# Patient Record
Sex: Male | Born: 1975 | Race: Black or African American | Hispanic: No | Marital: Married | Smoking: Current every day smoker
Health system: Southern US, Community
[De-identification: ages and names within clinical notes are randomized; demographics above are authoritative.]

## PROBLEM LIST (undated history)

## (undated) DIAGNOSIS — F17200 Nicotine dependence, unspecified, uncomplicated: Secondary | ICD-10-CM

## (undated) DIAGNOSIS — M549 Dorsalgia, unspecified: Secondary | ICD-10-CM

## (undated) DIAGNOSIS — F1220 Cannabis dependence, uncomplicated: Secondary | ICD-10-CM

## (undated) DIAGNOSIS — F1020 Alcohol dependence, uncomplicated: Secondary | ICD-10-CM

---

## 1998-10-28 ENCOUNTER — Emergency Department (HOSPITAL_COMMUNITY): Admission: EM | Admit: 1998-10-28 | Discharge: 1998-10-28 | Payer: Self-pay | Admitting: Emergency Medicine

## 1999-01-04 ENCOUNTER — Emergency Department (HOSPITAL_COMMUNITY): Admission: EM | Admit: 1999-01-04 | Discharge: 1999-01-04 | Payer: Self-pay | Admitting: Emergency Medicine

## 1999-07-22 ENCOUNTER — Encounter: Admission: RE | Admit: 1999-07-22 | Discharge: 1999-07-22 | Payer: Self-pay | Admitting: Internal Medicine

## 2006-10-29 ENCOUNTER — Emergency Department (HOSPITAL_COMMUNITY): Admission: EM | Admit: 2006-10-29 | Discharge: 2006-10-29 | Payer: Self-pay | Admitting: Emergency Medicine

## 2008-01-13 ENCOUNTER — Emergency Department (HOSPITAL_COMMUNITY): Admission: EM | Admit: 2008-01-13 | Discharge: 2008-01-13 | Payer: Self-pay | Admitting: Emergency Medicine

## 2010-12-07 ENCOUNTER — Emergency Department (HOSPITAL_COMMUNITY)
Admission: EM | Admit: 2010-12-07 | Discharge: 2010-12-07 | Disposition: A | Discharge status: Home / Self Care | Payer: Self-pay | Attending: Emergency Medicine | Admitting: Emergency Medicine

## 2010-12-07 ENCOUNTER — Emergency Department (HOSPITAL_COMMUNITY): Payer: Self-pay

## 2010-12-07 DIAGNOSIS — M5030 Other cervical disc degeneration, unspecified cervical region: Secondary | ICD-10-CM | POA: Insufficient documentation

## 2010-12-07 DIAGNOSIS — M546 Pain in thoracic spine: Secondary | ICD-10-CM | POA: Insufficient documentation

## 2010-12-07 DIAGNOSIS — M47812 Spondylosis without myelopathy or radiculopathy, cervical region: Secondary | ICD-10-CM | POA: Insufficient documentation

## 2010-12-07 DIAGNOSIS — M4802 Spinal stenosis, cervical region: Secondary | ICD-10-CM | POA: Insufficient documentation

## 2010-12-07 DIAGNOSIS — R209 Unspecified disturbances of skin sensation: Secondary | ICD-10-CM | POA: Insufficient documentation

## 2010-12-07 DIAGNOSIS — M5020 Other cervical disc displacement, unspecified cervical region: Secondary | ICD-10-CM | POA: Insufficient documentation

## 2012-08-26 ENCOUNTER — Encounter (HOSPITAL_COMMUNITY): Payer: Self-pay | Admitting: Emergency Medicine

## 2012-08-26 ENCOUNTER — Emergency Department (HOSPITAL_COMMUNITY)
Admission: EM | Admit: 2012-08-26 | Discharge: 2012-08-26 | Disposition: A | Discharge status: Home / Self Care | Payer: No Typology Code available for payment source | Attending: Emergency Medicine | Admitting: Emergency Medicine

## 2012-08-26 ENCOUNTER — Emergency Department (HOSPITAL_COMMUNITY): Payer: No Typology Code available for payment source

## 2012-08-26 DIAGNOSIS — F17200 Nicotine dependence, unspecified, uncomplicated: Secondary | ICD-10-CM | POA: Insufficient documentation

## 2012-08-26 DIAGNOSIS — M25522 Pain in left elbow: Secondary | ICD-10-CM

## 2012-08-26 DIAGNOSIS — S59919A Unspecified injury of unspecified forearm, initial encounter: Secondary | ICD-10-CM | POA: Insufficient documentation

## 2012-08-26 DIAGNOSIS — Y939 Activity, unspecified: Secondary | ICD-10-CM | POA: Insufficient documentation

## 2012-08-26 DIAGNOSIS — S0993XA Unspecified injury of face, initial encounter: Secondary | ICD-10-CM | POA: Insufficient documentation

## 2012-08-26 DIAGNOSIS — S6990XA Unspecified injury of unspecified wrist, hand and finger(s), initial encounter: Secondary | ICD-10-CM | POA: Insufficient documentation

## 2012-08-26 DIAGNOSIS — S59909A Unspecified injury of unspecified elbow, initial encounter: Secondary | ICD-10-CM | POA: Insufficient documentation

## 2012-08-26 DIAGNOSIS — M542 Cervicalgia: Secondary | ICD-10-CM

## 2012-08-26 DIAGNOSIS — S199XXA Unspecified injury of neck, initial encounter: Secondary | ICD-10-CM | POA: Insufficient documentation

## 2012-08-26 MED ORDER — IBUPROFEN 600 MG PO TABS: 600.0000 mg | ORAL_TABLET | Freq: Four times a day (QID) | ORAL | Status: DC | PRN

## 2012-08-26 MED ORDER — TRAMADOL HCL 50 MG PO TABS
100.0000 mg | ORAL_TABLET | Freq: Once | ORAL | Status: AC
  Administered 2012-08-26: 100 mg via ORAL
  Filled 2012-08-26: qty 2

## 2012-08-26 MED ORDER — TRAMADOL-ACETAMINOPHEN 37.5-325 MG PO TABS: ORAL_TABLET | ORAL | Status: DC

## 2012-08-26 MED ORDER — CYCLOBENZAPRINE HCL 10 MG PO TABS
10.0000 mg | ORAL_TABLET | Freq: Once | ORAL | Status: AC
  Administered 2012-08-26: 10 mg via ORAL
  Filled 2012-08-26: qty 1

## 2012-08-26 MED ORDER — METHOCARBAMOL 500 MG PO TABS: ORAL_TABLET | ORAL | Status: DC

## 2012-08-26 MED ORDER — IBUPROFEN 400 MG PO TABS
800.0000 mg | ORAL_TABLET | Freq: Once | ORAL | Status: AC
  Administered 2012-08-26: 800 mg via ORAL
  Filled 2012-08-26: qty 2
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
## 2012-08-26 NOTE — ED Notes (Signed)
Discharge instructions reviewed. Pt verbalized understanding.  

## 2012-08-30 ENCOUNTER — Emergency Department (HOSPITAL_COMMUNITY)
Admission: EM | Admit: 2012-08-30 | Discharge: 2012-08-30 | Disposition: A | Discharge status: Home / Self Care | Payer: No Typology Code available for payment source | Attending: Emergency Medicine | Admitting: Emergency Medicine

## 2012-08-30 ENCOUNTER — Encounter (HOSPITAL_COMMUNITY): Payer: Self-pay | Admitting: Emergency Medicine

## 2012-08-30 DIAGNOSIS — M545 Low back pain, unspecified: Secondary | ICD-10-CM | POA: Insufficient documentation

## 2012-08-30 DIAGNOSIS — F17200 Nicotine dependence, unspecified, uncomplicated: Secondary | ICD-10-CM | POA: Insufficient documentation

## 2012-08-30 DIAGNOSIS — S39012A Strain of muscle, fascia and tendon of lower back, initial encounter: Secondary | ICD-10-CM

## 2012-08-30 MED ORDER — KETOROLAC TROMETHAMINE 60 MG/2ML IM SOLN
60.0000 mg | Freq: Once | INTRAMUSCULAR | Status: AC
  Administered 2012-08-30: 60 mg via INTRAMUSCULAR
  Filled 2012-08-30: qty 2

## 2012-08-30 MED ORDER — ACETAMINOPHEN 325 MG PO TABS
975.0000 mg | ORAL_TABLET | Freq: Once | ORAL | Status: AC
  Administered 2012-08-30: 975 mg via ORAL
  Filled 2012-08-30: qty 1
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
## 2012-08-30 NOTE — ED Provider Notes (Signed)
Medical screening examination/treatment/procedure(s) were conducted as a shared visit with non-physician practitioner(s) and myself.  I personally evaluated the patient during the encounter.  No focal deficits on exam.  He has an exam consistent with lower back strain.  Encouraged him to fill the prescriptions he received during his first ER visit.  Tobin Chad, MD 08/30/12 2056
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
## 2012-10-17 ENCOUNTER — Ambulatory Visit
Payer: No Typology Code available for payment source | Attending: Orthopaedic Surgery | Admitting: Rehabilitative and Restorative Service Providers"

## 2012-10-17 DIAGNOSIS — IMO0001 Reserved for inherently not codable concepts without codable children: Secondary | ICD-10-CM | POA: Insufficient documentation

## 2012-10-17 DIAGNOSIS — M545 Low back pain, unspecified: Secondary | ICD-10-CM | POA: Insufficient documentation

## 2012-10-17 DIAGNOSIS — M542 Cervicalgia: Secondary | ICD-10-CM | POA: Insufficient documentation

## 2012-10-17 DIAGNOSIS — M25519 Pain in unspecified shoulder: Secondary | ICD-10-CM | POA: Insufficient documentation

## 2012-10-17 DIAGNOSIS — M2569 Stiffness of other specified joint, not elsewhere classified: Secondary | ICD-10-CM | POA: Insufficient documentation

## 2012-10-30 ENCOUNTER — Ambulatory Visit: Payer: No Typology Code available for payment source | Admitting: Physical Therapy

## 2012-11-02 ENCOUNTER — Encounter: Payer: Self-pay | Admitting: Physical Therapy

## 2012-11-06 ENCOUNTER — Ambulatory Visit: Payer: No Typology Code available for payment source | Attending: Orthopaedic Surgery | Admitting: Physical Therapy

## 2012-11-06 DIAGNOSIS — IMO0001 Reserved for inherently not codable concepts without codable children: Secondary | ICD-10-CM | POA: Insufficient documentation

## 2012-11-06 DIAGNOSIS — M2569 Stiffness of other specified joint, not elsewhere classified: Secondary | ICD-10-CM | POA: Insufficient documentation

## 2012-11-06 DIAGNOSIS — M545 Low back pain, unspecified: Secondary | ICD-10-CM | POA: Insufficient documentation

## 2012-11-06 DIAGNOSIS — M542 Cervicalgia: Secondary | ICD-10-CM | POA: Insufficient documentation

## 2012-11-06 DIAGNOSIS — M25519 Pain in unspecified shoulder: Secondary | ICD-10-CM | POA: Insufficient documentation

## 2012-11-09 ENCOUNTER — Encounter: Payer: Self-pay | Admitting: Physical Therapy

## 2012-11-09 ENCOUNTER — Ambulatory Visit: Payer: No Typology Code available for payment source | Admitting: Physical Therapy

## 2012-11-13 ENCOUNTER — Encounter: Payer: Self-pay | Admitting: Physical Therapy

## 2012-11-14 ENCOUNTER — Ambulatory Visit: Payer: No Typology Code available for payment source | Admitting: Rehabilitative and Restorative Service Providers"

## 2012-11-15 ENCOUNTER — Encounter: Payer: Self-pay | Admitting: Rehabilitative and Restorative Service Providers"

## 2012-11-15 ENCOUNTER — Ambulatory Visit: Payer: No Typology Code available for payment source | Admitting: Rehabilitative and Restorative Service Providers"

## 2012-11-20 ENCOUNTER — Ambulatory Visit: Payer: No Typology Code available for payment source | Admitting: Rehabilitative and Restorative Service Providers"

## 2012-11-21 ENCOUNTER — Ambulatory Visit: Payer: No Typology Code available for payment source | Admitting: Physical Therapy

## 2012-11-23 ENCOUNTER — Ambulatory Visit: Payer: No Typology Code available for payment source | Admitting: Physical Therapy

## 2012-11-27 ENCOUNTER — Ambulatory Visit: Payer: No Typology Code available for payment source | Admitting: Rehabilitative and Restorative Service Providers"

## 2012-11-30 ENCOUNTER — Ambulatory Visit: Payer: No Typology Code available for payment source | Admitting: Rehabilitation

## 2012-12-04 ENCOUNTER — Ambulatory Visit
Payer: No Typology Code available for payment source | Attending: Orthopaedic Surgery | Admitting: Rehabilitative and Restorative Service Providers"

## 2012-12-04 DIAGNOSIS — IMO0001 Reserved for inherently not codable concepts without codable children: Secondary | ICD-10-CM | POA: Insufficient documentation

## 2012-12-04 DIAGNOSIS — M2569 Stiffness of other specified joint, not elsewhere classified: Secondary | ICD-10-CM | POA: Insufficient documentation

## 2012-12-04 DIAGNOSIS — M25519 Pain in unspecified shoulder: Secondary | ICD-10-CM | POA: Insufficient documentation

## 2012-12-04 DIAGNOSIS — M545 Low back pain, unspecified: Secondary | ICD-10-CM | POA: Insufficient documentation

## 2012-12-04 DIAGNOSIS — M542 Cervicalgia: Secondary | ICD-10-CM | POA: Insufficient documentation

## 2012-12-07 ENCOUNTER — Ambulatory Visit: Payer: No Typology Code available for payment source | Admitting: Rehabilitative and Restorative Service Providers"

## 2012-12-12 ENCOUNTER — Ambulatory Visit: Payer: No Typology Code available for payment source | Admitting: Rehabilitation

## 2012-12-12 ENCOUNTER — Ambulatory Visit: Payer: No Typology Code available for payment source | Admitting: Rehabilitative and Restorative Service Providers"

## 2012-12-14 ENCOUNTER — Ambulatory Visit: Payer: No Typology Code available for payment source | Admitting: Physical Therapy

## 2013-01-23 ENCOUNTER — Other Ambulatory Visit (HOSPITAL_COMMUNITY): Payer: Self-pay | Admitting: Orthopaedic Surgery

## 2013-01-23 DIAGNOSIS — M5030 Other cervical disc degeneration, unspecified cervical region: Secondary | ICD-10-CM

## 2013-01-26 ENCOUNTER — Ambulatory Visit (HOSPITAL_COMMUNITY): Payer: No Typology Code available for payment source

## 2013-08-14 ENCOUNTER — Emergency Department (INDEPENDENT_AMBULATORY_CARE_PROVIDER_SITE_OTHER)
Admission: EM | Admit: 2013-08-14 | Discharge: 2013-08-14 | Disposition: A | Discharge status: Home / Self Care | Payer: Self-pay | Source: Home / Self Care | Attending: Family Medicine | Admitting: Family Medicine

## 2013-08-14 ENCOUNTER — Encounter (HOSPITAL_COMMUNITY): Payer: Self-pay | Admitting: Emergency Medicine

## 2013-08-14 DIAGNOSIS — M5412 Radiculopathy, cervical region: Secondary | ICD-10-CM

## 2013-08-14 MED ORDER — MELOXICAM 15 MG PO TABS: 15.0000 mg | ORAL_TABLET | Freq: Every day | ORAL | Status: DC | PRN
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
## 2017-08-16 DIAGNOSIS — Z13220 Encounter for screening for lipoid disorders: Secondary | ICD-10-CM | POA: Diagnosis not present

## 2017-08-16 DIAGNOSIS — Z125 Encounter for screening for malignant neoplasm of prostate: Secondary | ICD-10-CM | POA: Diagnosis not present

## 2017-08-16 DIAGNOSIS — Z0000 Encounter for general adult medical examination without abnormal findings: Secondary | ICD-10-CM | POA: Diagnosis not present

## 2017-08-16 DIAGNOSIS — D649 Anemia, unspecified: Secondary | ICD-10-CM | POA: Diagnosis not present

## 2017-08-16 DIAGNOSIS — Z23 Encounter for immunization: Secondary | ICD-10-CM | POA: Diagnosis not present

## 2017-11-23 ENCOUNTER — Encounter (HOSPITAL_COMMUNITY): Payer: Self-pay | Admitting: Family Medicine

## 2017-11-23 ENCOUNTER — Emergency Department (HOSPITAL_COMMUNITY)
Admission: EM | Admit: 2017-11-23 | Discharge: 2017-11-23 | Disposition: A | Discharge status: Home / Self Care | Payer: BLUE CROSS/BLUE SHIELD | Attending: Emergency Medicine | Admitting: Emergency Medicine

## 2017-11-23 DIAGNOSIS — Z79899 Other long term (current) drug therapy: Secondary | ICD-10-CM | POA: Diagnosis not present

## 2017-11-23 DIAGNOSIS — M545 Low back pain, unspecified: Secondary | ICD-10-CM

## 2017-11-23 DIAGNOSIS — F17210 Nicotine dependence, cigarettes, uncomplicated: Secondary | ICD-10-CM | POA: Insufficient documentation

## 2017-11-23 MED ORDER — CYCLOBENZAPRINE HCL 10 MG PO TABS
10.0000 mg | ORAL_TABLET | Freq: Once | ORAL | Status: AC
  Administered 2017-11-23: 10 mg via ORAL
  Filled 2017-11-23: qty 1

## 2017-11-23 MED ORDER — MELOXICAM 15 MG PO TABS: 15.0000 mg | ORAL_TABLET | Freq: Every day | ORAL | 0 refills | Status: DC

## 2017-11-23 MED ORDER — LIDOCAINE 5 % EX PTCH: 1.0000 | MEDICATED_PATCH | CUTANEOUS | 0 refills | Status: DC

## 2017-11-23 MED ORDER — METHOCARBAMOL 500 MG PO TABS: 500.0000 mg | ORAL_TABLET | Freq: Two times a day (BID) | ORAL | 0 refills | Status: DC

## 2017-11-23 MED ORDER — KETOROLAC TROMETHAMINE 30 MG/ML IJ SOLN
30.0000 mg | Freq: Once | INTRAMUSCULAR | Status: AC
  Administered 2017-11-23: 30 mg via INTRAMUSCULAR
  Filled 2017-11-23: qty 1
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
## 2017-11-23 NOTE — Discharge Instructions (Signed)
You were seen here today for Back Pain: Low back pain is discomfort in the lower back that may be due to injuries to muscles and ligaments around the spine. Occasionally, it may be caused by a problem to a part of the spine called a disc. Your back pain should be treated with medicines listed below as well as back exercises and this back pain should get better over the next 2 weeks. Most patients get completely well in 4 weeks. It is important to know however, if you develop severe or worsening pain, low back pain with fever, numbness, weakness or inability to walk or urinate, you should return to the ER immediately.  Please follow up with your doctor this week for a recheck if still having symptoms.  HOME INSTRUCTIONS Self - care:  The application of heat can help soothe the pain.  Maintaining your daily activities, including walking (this is encouraged), as it will help you get better faster than just staying in bed. Do not life, push, pull anything more than 10 pounds for the next week. I am attaching back exercises that you can do at home to help facilitate your recovery.   Back Exercises - I have attached a handout on back exercises that can be done at home to help facilitate your recovery.   Medications are also useful to help with pain control.   Acetaminophen.  This medication is generally safe, and found over the counter. Take as directed for your age. You should not take more than 8 of the extra strength (500mg ) pills a day (max dose is 4000mg  total OVER one day)  Non steroidal anti inflammatory: This includes medications including Ibuprofen, naproxen and Mobic; These medications help both pain and swelling and are very useful in treating back pain.  They should be taken with food, as they can cause stomach upset, and more seriously, stomach bleeding. Do not combine the medications.   Muscle relaxants:  These medications can help with muscle tightness that is a cause of lower back pain.  Most  of these medications can cause drowsiness, and it is not safe to drive or use dangerous machinery while taking them. They are primarily helpful when taken at night before sleep.  Lidocaine Patches: Apply as directed.   You will need to follow up with your primary healthcare provider  or the Orthopedist in 1-2 weeks for reassessment and persistent symptoms.  Be aware that if you develop new symptoms, such as a fever, leg weakness, difficulty with or loss of control of your urine or bowels, abdominal pain, or more severe pain, you will need to seek medical attention and/or return to the Emergency department. Additional Information:  Your vital signs today were: BP 140/87 (BP Location: Left Arm)    Pulse 85    Temp 98.4 F (36.9 C) (Oral)    Resp 16    Ht 6\' 1"  (1.854 m)    Wt 82.6 kg (182 lb)    SpO2 98%    BMI 24.01 kg/m  If your blood pressure (BP) was elevated above 135/85 this visit, please have this repeated by your doctor within one month. ---------------

## 2018-12-05 DIAGNOSIS — Z0000 Encounter for general adult medical examination without abnormal findings: Secondary | ICD-10-CM | POA: Diagnosis not present

## 2018-12-05 DIAGNOSIS — Z125 Encounter for screening for malignant neoplasm of prostate: Secondary | ICD-10-CM | POA: Diagnosis not present

## 2018-12-05 DIAGNOSIS — Z113 Encounter for screening for infections with a predominantly sexual mode of transmission: Secondary | ICD-10-CM | POA: Diagnosis not present

## 2018-12-05 DIAGNOSIS — D649 Anemia, unspecified: Secondary | ICD-10-CM | POA: Diagnosis not present

## 2018-12-05 DIAGNOSIS — Z13220 Encounter for screening for lipoid disorders: Secondary | ICD-10-CM | POA: Diagnosis not present

## 2018-12-20 DIAGNOSIS — R1013 Epigastric pain: Secondary | ICD-10-CM | POA: Diagnosis not present

## 2018-12-20 DIAGNOSIS — E611 Iron deficiency: Secondary | ICD-10-CM | POA: Diagnosis not present

## 2019-07-15 ENCOUNTER — Other Ambulatory Visit: Payer: Self-pay

## 2019-07-15 ENCOUNTER — Encounter (HOSPITAL_COMMUNITY): Payer: Self-pay | Admitting: Emergency Medicine

## 2019-07-15 ENCOUNTER — Ambulatory Visit (HOSPITAL_COMMUNITY)
Admission: EM | Admit: 2019-07-15 | Discharge: 2019-07-15 | Disposition: A | Discharge status: Home / Self Care | Payer: BC Managed Care – PPO | Attending: Family Medicine | Admitting: Family Medicine

## 2019-07-15 DIAGNOSIS — S39012A Strain of muscle, fascia and tendon of lower back, initial encounter: Secondary | ICD-10-CM

## 2019-07-15 MED ORDER — MELOXICAM 15 MG PO TABS: 15.0000 mg | ORAL_TABLET | Freq: Every day | ORAL | 0 refills | Status: DC

## 2019-07-15 MED ORDER — CYCLOBENZAPRINE HCL 10 MG PO TABS: 10.0000 mg | ORAL_TABLET | Freq: Two times a day (BID) | ORAL | 0 refills | Status: DC | PRN

## 2019-07-15 NOTE — ED Provider Notes (Signed)
[place]    CSN: [id number] Arrival date & time: 07/15/19  1528      History   Chief Complaint Chief Complaint  Patient presents with  . Back Pain    HPI Derek [name] is a 43 y.o. male.  Patient was lifting a sofa several days ago at work and felt pain in his left low back.  He denies any radiation.  He has had some back strain issues before.  He has tried heating pad without much success. HPI  History reviewed. No pertinent past medical history.  There are no active problems to display for this patient.   History reviewed. No pertinent surgical history.     Home Medications    Prior to Admission medications   Medication Sig Start Date End Date Taking? Authorizing Provider  ferrous sulfate 325 (65 FE) MG tablet Take 325 mg by mouth 2 (two) times daily. 08/22/17   [provider]  ibuprofen (ADVIL,MOTRIN) 200 MG tablet Take 400 mg by mouth every 6 (six) hours as needed. For pain    [provider]  lidocaine (LIDODERM) 5 % Place 1 patch onto the skin daily. Remove & Discard patch within 12 hours or as directed by MD 11/23/17   Maczis, Barth Kirks, PA-C  meloxicam (MOBIC) 15 MG tablet Take 1 tablet (15 mg total) by mouth daily. 11/23/17   Maczis, Barth Kirks, PA-C  methocarbamol (ROBAXIN) 500 MG tablet Take 1 tablet (500 mg total) by mouth 2 (two) times daily. 11/23/17   Maczis, Barth Kirks, PA-C    Family History History reviewed. No pertinent family history.  Social History Social History   Tobacco Use  . Smoking status: Current Every Day Smoker    Packs/day: 0.20    Types: Cigarettes  . Smokeless tobacco: Never Used  Substance Use Topics  . Alcohol use: Yes    Comment: 2-3 times a week.   . Drug use: No     Allergies   Patient has no known allergies.   Review of Systems Review of Systems  Musculoskeletal: Positive for back pain.  All other systems reviewed and are negative.    Physical Exam Triage Vital Signs ED  Triage Vitals  Enc Vitals Group     BP 07/15/19 1545 (!) 149/94     Pulse Rate 07/15/19 1545 97     Resp 07/15/19 1545 18     Temp 07/15/19 1545 99.2 F (37.3 C)     Temp Source 07/15/19 1545 Temporal     SpO2 07/15/19 1545 100 %     Weight --      Height --      Head Circumference --      Peak Flow --      Pain Score 07/15/19 1541 9     Pain Loc --      Pain Edu? --      Excl. in [place]? --    No data found.  Updated Vital Signs BP (!) 149/94 (BP Location: Right Arm)   Pulse 97   Temp 99.2 F (37.3 C) (Temporal)   Resp 18   SpO2 100%   Visual Acuity Right Eye Distance:   Left Eye Distance:   Bilateral Distance:    Right Eye Near:   Left Eye Near:    Bilateral Near:     Physical Exam Vitals signs and nursing note reviewed.  Constitutional:      Appearance: Normal appearance.  Cardiovascular:     Rate  and Rhythm: Normal rate and regular rhythm.  Pulmonary:     Effort: Pulmonary effort is normal.  Musculoskeletal:     Comments: Back: Decreased flexion lateral bending is almost normal. Straight leg raising is negative Deep tendon reflexes are symmetric  Neurological:     Mental Status: He is alert.      UC Treatments / Results  Labs (all labs ordered are listed, but only abnormal results are displayed) Labs Reviewed - No data to display  EKG   Radiology No results found.  Procedures Procedures (including critical care time)  Medications Ordered in UC Medications - No data to display  Initial Impression / Assessment and Plan / UC Course  I have reviewed the triage vital signs and the nursing notes.  Pertinent labs & imaging results that were available during my care of the patient were reviewed by me and considered in my medical decision making (see chart for details).     Mechanical low back strain Final Clinical Impressions(s) / UC Diagnoses   Final diagnoses:  None   Discharge Instructions   None    ED Prescriptions    None      Controlled Substance Prescriptions [place] Controlled Substance Registry consulted? No   Frederica KusterMiller, Stephen M, MD 07/15/19 [phone]

## 2019-07-15 NOTE — ED Triage Notes (Signed)
Picked up a heavy piece of furniture while at work Thursday.  Pain in lower back, just above tailbone, lower left.  Denies pain in legs.    History of back issues

## 2020-01-07 ENCOUNTER — Inpatient Hospital Stay (HOSPITAL_COMMUNITY): Payer: BC Managed Care – PPO

## 2020-01-07 ENCOUNTER — Emergency Department (HOSPITAL_COMMUNITY): Payer: BC Managed Care – PPO

## 2020-01-07 ENCOUNTER — Encounter (HOSPITAL_COMMUNITY): Payer: Self-pay | Admitting: Internal Medicine

## 2020-01-07 ENCOUNTER — Inpatient Hospital Stay (HOSPITAL_COMMUNITY)
Admission: EM | Admit: 2020-01-07 | Discharge: 2020-01-15 | DRG: 064 | Disposition: A | Discharge status: Rehabilitation Facility | Payer: BC Managed Care – PPO | Attending: Internal Medicine | Admitting: Internal Medicine

## 2020-01-07 ENCOUNTER — Other Ambulatory Visit: Payer: Self-pay

## 2020-01-07 DIAGNOSIS — F1010 Alcohol abuse, uncomplicated: Secondary | ICD-10-CM | POA: Diagnosis not present

## 2020-01-07 DIAGNOSIS — K219 Gastro-esophageal reflux disease without esophagitis: Secondary | ICD-10-CM | POA: Diagnosis not present

## 2020-01-07 DIAGNOSIS — E663 Overweight: Secondary | ICD-10-CM | POA: Diagnosis present

## 2020-01-07 DIAGNOSIS — R29810 Facial weakness: Secondary | ICD-10-CM | POA: Diagnosis present

## 2020-01-07 DIAGNOSIS — E872 Acidosis: Secondary | ICD-10-CM | POA: Diagnosis not present

## 2020-01-07 DIAGNOSIS — Z20822 Contact with and (suspected) exposure to covid-19: Secondary | ICD-10-CM | POA: Diagnosis not present

## 2020-01-07 DIAGNOSIS — D72829 Elevated white blood cell count, unspecified: Secondary | ICD-10-CM

## 2020-01-07 DIAGNOSIS — Z716 Tobacco abuse counseling: Secondary | ICD-10-CM | POA: Diagnosis not present

## 2020-01-07 DIAGNOSIS — I63311 Cerebral infarction due to thrombosis of right middle cerebral artery: Secondary | ICD-10-CM | POA: Diagnosis not present

## 2020-01-07 DIAGNOSIS — I69391 Dysphagia following cerebral infarction: Secondary | ICD-10-CM | POA: Diagnosis not present

## 2020-01-07 DIAGNOSIS — R6510 Systemic inflammatory response syndrome (SIRS) of non-infectious origin without acute organ dysfunction: Secondary | ICD-10-CM | POA: Diagnosis present

## 2020-01-07 DIAGNOSIS — F1020 Alcohol dependence, uncomplicated: Secondary | ICD-10-CM | POA: Diagnosis not present

## 2020-01-07 DIAGNOSIS — M549 Dorsalgia, unspecified: Secondary | ICD-10-CM | POA: Diagnosis not present

## 2020-01-07 DIAGNOSIS — Z6828 Body mass index (BMI) 28.0-28.9, adult: Secondary | ICD-10-CM

## 2020-01-07 DIAGNOSIS — F1220 Cannabis dependence, uncomplicated: Secondary | ICD-10-CM | POA: Diagnosis not present

## 2020-01-07 DIAGNOSIS — R531 Weakness: Secondary | ICD-10-CM | POA: Diagnosis not present

## 2020-01-07 DIAGNOSIS — I639 Cerebral infarction, unspecified: Secondary | ICD-10-CM | POA: Diagnosis not present

## 2020-01-07 DIAGNOSIS — F17210 Nicotine dependence, cigarettes, uncomplicated: Secondary | ICD-10-CM | POA: Diagnosis not present

## 2020-01-07 DIAGNOSIS — I619 Nontraumatic intracerebral hemorrhage, unspecified: Secondary | ICD-10-CM | POA: Diagnosis present

## 2020-01-07 DIAGNOSIS — I63511 Cerebral infarction due to unspecified occlusion or stenosis of right middle cerebral artery: Secondary | ICD-10-CM | POA: Diagnosis not present

## 2020-01-07 DIAGNOSIS — Z79899 Other long term (current) drug therapy: Secondary | ICD-10-CM

## 2020-01-07 DIAGNOSIS — E876 Hypokalemia: Secondary | ICD-10-CM | POA: Diagnosis not present

## 2020-01-07 DIAGNOSIS — Z791 Long term (current) use of non-steroidal anti-inflammatories (NSAID): Secondary | ICD-10-CM | POA: Diagnosis not present

## 2020-01-07 DIAGNOSIS — G894 Chronic pain syndrome: Secondary | ICD-10-CM | POA: Diagnosis not present

## 2020-01-07 DIAGNOSIS — G8110 Spastic hemiplegia affecting unspecified side: Secondary | ICD-10-CM | POA: Diagnosis not present

## 2020-01-07 DIAGNOSIS — R252 Cramp and spasm: Secondary | ICD-10-CM | POA: Diagnosis not present

## 2020-01-07 DIAGNOSIS — R1312 Dysphagia, oropharyngeal phase: Secondary | ICD-10-CM | POA: Diagnosis not present

## 2020-01-07 DIAGNOSIS — K5900 Constipation, unspecified: Secondary | ICD-10-CM | POA: Diagnosis not present

## 2020-01-07 DIAGNOSIS — R296 Repeated falls: Secondary | ICD-10-CM | POA: Diagnosis present

## 2020-01-07 DIAGNOSIS — I10 Essential (primary) hypertension: Secondary | ICD-10-CM | POA: Diagnosis not present

## 2020-01-07 DIAGNOSIS — G8194 Hemiplegia, unspecified affecting left nondominant side: Secondary | ICD-10-CM | POA: Diagnosis not present

## 2020-01-07 DIAGNOSIS — G8929 Other chronic pain: Secondary | ICD-10-CM | POA: Diagnosis not present

## 2020-01-07 DIAGNOSIS — Z8679 Personal history of other diseases of the circulatory system: Secondary | ICD-10-CM | POA: Diagnosis not present

## 2020-01-07 DIAGNOSIS — R1310 Dysphagia, unspecified: Secondary | ICD-10-CM | POA: Diagnosis present

## 2020-01-07 DIAGNOSIS — I69192 Facial weakness following nontraumatic intracerebral hemorrhage: Secondary | ICD-10-CM | POA: Diagnosis not present

## 2020-01-07 DIAGNOSIS — H5500 Unspecified nystagmus: Secondary | ICD-10-CM | POA: Diagnosis not present

## 2020-01-07 DIAGNOSIS — E785 Hyperlipidemia, unspecified: Secondary | ICD-10-CM | POA: Diagnosis not present

## 2020-01-07 DIAGNOSIS — F17200 Nicotine dependence, unspecified, uncomplicated: Secondary | ICD-10-CM | POA: Diagnosis present

## 2020-01-07 DIAGNOSIS — I69191 Dysphagia following nontraumatic intracerebral hemorrhage: Secondary | ICD-10-CM | POA: Diagnosis not present

## 2020-01-07 DIAGNOSIS — I63411 Cerebral infarction due to embolism of right middle cerebral artery: Secondary | ICD-10-CM | POA: Diagnosis not present

## 2020-01-07 DIAGNOSIS — I69154 Hemiplegia and hemiparesis following nontraumatic intracerebral hemorrhage affecting left non-dominant side: Secondary | ICD-10-CM | POA: Diagnosis not present

## 2020-01-07 DIAGNOSIS — R52 Pain, unspecified: Secondary | ICD-10-CM | POA: Diagnosis not present

## 2020-01-07 DIAGNOSIS — G936 Cerebral edema: Secondary | ICD-10-CM | POA: Diagnosis not present

## 2020-01-07 DIAGNOSIS — W19XXXA Unspecified fall, initial encounter: Secondary | ICD-10-CM | POA: Diagnosis not present

## 2020-01-07 DIAGNOSIS — Z7982 Long term (current) use of aspirin: Secondary | ICD-10-CM | POA: Diagnosis not present

## 2020-01-07 HISTORY — DX: Dorsalgia, unspecified: M54.9

## 2020-01-07 HISTORY — DX: Nicotine dependence, unspecified, uncomplicated: F17.200

## 2020-01-07 HISTORY — DX: Alcohol dependence, uncomplicated: F10.20

## 2020-01-07 HISTORY — DX: Cannabis dependence, uncomplicated: F12.20

## 2020-01-07 HISTORY — DX: Cerebral infarction due to unspecified occlusion or stenosis of right middle cerebral artery: I63.511

## 2020-01-07 LAB — COMPREHENSIVE METABOLIC PANEL
ALT: 31 U/L (ref 0–44)
AST: 43 U/L — ABNORMAL HIGH (ref 15–41)
Albumin: 4.2 g/dL (ref 3.5–5.0)
Alkaline Phosphatase: 85 U/L (ref 38–126)
Anion gap: 15 (ref 5–15)
BUN: 8 mg/dL (ref 6–20)
CO2: 23 mmol/L (ref 22–32)
Calcium: 9.6 mg/dL (ref 8.9–10.3)
Chloride: 102 mmol/L (ref 98–111)
Creatinine, Ser: 1.19 mg/dL (ref 0.61–1.24)
GFR calc Af Amer: >60 mL/min (ref >60)
GFR calc non Af Amer: >60 mL/min (ref >60)
Glucose, Bld: 134 mg/dL — ABNORMAL HIGH (ref 70–99)
Potassium: 4 mmol/L (ref 3.5–5.1)
Sodium: 140 mmol/L (ref 135–145)
Total Bilirubin: 0.8 mg/dL (ref 0.3–1.2)
Total Protein: 7.5 g/dL (ref 6.5–8.1)

## 2020-01-07 LAB — POC SARS CORONAVIRUS 2 AG -  ED: SARS Coronavirus 2 Ag: NEGATIVE

## 2020-01-07 LAB — CBC WITH DIFFERENTIAL/PLATELET
Abs Immature Granulocytes: 0.09 10*3/uL — ABNORMAL HIGH (ref 0.00–0.07)
Basophils Absolute: 0 10*3/uL (ref 0.0–0.1)
Basophils Relative: 0 %
Eosinophils Absolute: 0 10*3/uL (ref 0.0–0.5)
Eosinophils Relative: 0 %
HCT: 44.7 % (ref 39.0–52.0)
Hemoglobin: 14.8 g/dL (ref 13.0–17.0)
Immature Granulocytes: 1 %
Lymphocytes Relative: 5 %
Lymphs Abs: 0.7 10*3/uL (ref 0.7–4.0)
MCH: 29.4 pg (ref 26.0–34.0)
MCHC: 33.1 g/dL (ref 30.0–36.0)
MCV: 88.9 fL (ref 80.0–100.0)
Monocytes Absolute: 1 10*3/uL (ref 0.1–1.0)
Monocytes Relative: 7 %
Neutro Abs: 12.6 10*3/uL — ABNORMAL HIGH (ref 1.7–7.7)
Neutrophils Relative %: 87 %
Platelets: 314 10*3/uL (ref 150–400)
RBC: 5.03 MIL/uL (ref 4.22–5.81)
RDW: 18.3 % — ABNORMAL HIGH (ref 11.5–15.5)
WBC: 14.3 10*3/uL — ABNORMAL HIGH (ref 4.0–10.5)
nRBC: 0 % (ref 0.0–0.2)

## 2020-01-07 LAB — RAPID URINE DRUG SCREEN, HOSP PERFORMED
Amphetamines: NOT DETECTED
Barbiturates: NOT DETECTED
Benzodiazepines: NOT DETECTED
Cocaine: NOT DETECTED
Opiates: NOT DETECTED
Tetrahydrocannabinol: POSITIVE — AB

## 2020-01-07 LAB — APTT: aPTT: 27 seconds (ref 24–36)

## 2020-01-07 LAB — URINALYSIS, ROUTINE W REFLEX MICROSCOPIC
Bacteria, UA: NONE SEEN
Bilirubin Urine: NEGATIVE
Glucose, UA: NEGATIVE mg/dL
Ketones, ur: 5 mg/dL — AB
Leukocytes,Ua: NEGATIVE
Nitrite: NEGATIVE
Protein, ur: 100 mg/dL — AB
Specific Gravity, Urine: 1.028 (ref 1.005–1.030)
pH: 6 (ref 5.0–8.0)

## 2020-01-07 LAB — MAGNESIUM: Magnesium: 2.1 mg/dL (ref 1.7–2.4)

## 2020-01-07 LAB — TSH: TSH: 0.501 u[IU]/mL (ref 0.350–4.500)

## 2020-01-07 LAB — PROTIME-INR
INR: 1 (ref 0.8–1.2)
Prothrombin Time: 13.3 seconds (ref 11.4–15.2)

## 2020-01-07 LAB — HIV ANTIBODY (ROUTINE TESTING W REFLEX): HIV Screen 4th Generation wRfx: NONREACTIVE

## 2020-01-07 LAB — LACTIC ACID, PLASMA: Lactic Acid, Venous: 1.9 mmol/L (ref 0.5–1.9)

## 2020-01-07 LAB — PHOSPHORUS: Phosphorus: 3.3 mg/dL (ref 2.5–4.6)

## 2020-01-07 LAB — CK: Total CK: 787 U/L — ABNORMAL HIGH (ref 49–397)

## 2020-01-07 LAB — SARS CORONAVIRUS 2 (TAT 6-24 HRS): SARS Coronavirus 2: NEGATIVE

## 2020-01-07 MED ORDER — METOPROLOL TARTRATE 5 MG/5ML IV SOLN
5.0000 mg | Freq: Two times a day (BID) | INTRAVENOUS | Status: DC
  Administered 2020-01-07 – 2020-01-11 (×7): 5 mg via INTRAVENOUS
  Filled 2020-01-07 (×7): qty 5

## 2020-01-07 MED ORDER — STROKE: EARLY STAGES OF RECOVERY BOOK
Freq: Once | Status: AC
  Filled 2020-01-07 (×2): qty 1

## 2020-01-07 MED ORDER — VANCOMYCIN HCL 1500 MG/300ML IV SOLN
1500.0000 mg | Freq: Two times a day (BID) | INTRAVENOUS | Status: DC
  Filled 2020-01-07: qty 300

## 2020-01-07 MED ORDER — METRONIDAZOLE IN NACL 5-0.79 MG/ML-% IV SOLN
500.0000 mg | Freq: Once | INTRAVENOUS | Status: AC
  Administered 2020-01-07: 500 mg via INTRAVENOUS
  Filled 2020-01-07: qty 100

## 2020-01-07 MED ORDER — SENNOSIDES-DOCUSATE SODIUM 8.6-50 MG PO TABS
1.0000 | ORAL_TABLET | Freq: Every evening | ORAL | Status: DC | PRN
  Administered 2020-01-15: 1 via ORAL
  Filled 2020-01-07: qty 1

## 2020-01-07 MED ORDER — SODIUM CHLORIDE 0.9 % IV BOLUS
1000.0000 mL | Freq: Once | INTRAVENOUS | Status: AC
  Administered 2020-01-07: 12:00:00 1000 mL via INTRAVENOUS

## 2020-01-07 MED ORDER — ATORVASTATIN CALCIUM 40 MG PO TABS
40.0000 mg | ORAL_TABLET | Freq: Every day | ORAL | Status: DC
  Administered 2020-01-08 – 2020-01-15 (×8): 40 mg via ORAL
  Filled 2020-01-07 (×7): qty 1

## 2020-01-07 MED ORDER — THIAMINE HCL 100 MG PO TABS
100.0000 mg | ORAL_TABLET | Freq: Every day | ORAL | Status: DC
  Administered 2020-01-08 – 2020-01-15 (×7): 100 mg via ORAL
  Filled 2020-01-07 (×7): qty 1

## 2020-01-07 MED ORDER — ACETAMINOPHEN 160 MG/5ML PO SOLN: 650.0000 mg | ORAL | Status: DC | PRN

## 2020-01-07 MED ORDER — ASPIRIN 325 MG PO TABS
325.0000 mg | ORAL_TABLET | Freq: Every day | ORAL | Status: DC
  Administered 2020-01-08 – 2020-01-15 (×8): 325 mg via ORAL
  Filled 2020-01-07 (×8): qty 1

## 2020-01-07 MED ORDER — NICOTINE 7 MG/24HR TD PT24
7.0000 mg | MEDICATED_PATCH | Freq: Every day | TRANSDERMAL | Status: DC
  Administered 2020-01-08 – 2020-01-11 (×3): 7 mg via TRANSDERMAL
  Filled 2020-01-07 (×7): qty 1

## 2020-01-07 MED ORDER — VANCOMYCIN HCL IN DEXTROSE 1-5 GM/200ML-% IV SOLN: 1000.0000 mg | Freq: Once | INTRAVENOUS | Status: DC

## 2020-01-07 MED ORDER — ACETAMINOPHEN 325 MG PO TABS: 650.0000 mg | ORAL_TABLET | Freq: Once | ORAL | Status: DC

## 2020-01-07 MED ORDER — SODIUM CHLORIDE 0.9 % IV SOLN: INTRAVENOUS | Status: DC

## 2020-01-07 MED ORDER — FOLIC ACID 1 MG PO TABS
1.0000 mg | ORAL_TABLET | Freq: Every day | ORAL | Status: DC
  Administered 2020-01-08 – 2020-01-15 (×8): 1 mg via ORAL
  Filled 2020-01-07 (×8): qty 1

## 2020-01-07 MED ORDER — SODIUM CHLORIDE 0.9 % IV SOLN
2.0000 g | Freq: Once | INTRAVENOUS | Status: AC
  Administered 2020-01-07: 09:00:00 2 g via INTRAVENOUS
  Filled 2020-01-07: qty 2

## 2020-01-07 MED ORDER — ENOXAPARIN SODIUM 40 MG/0.4ML ~~LOC~~ SOLN
40.0000 mg | SUBCUTANEOUS | Status: DC
  Filled 2020-01-07: qty 0.4

## 2020-01-07 MED ORDER — ENSURE ENLIVE PO LIQD
237.0000 mL | Freq: Two times a day (BID) | ORAL | Status: DC
  Administered 2020-01-09 – 2020-01-11 (×5): 237 mL via ORAL
  Filled 2020-01-07: qty 237

## 2020-01-07 MED ORDER — ASPIRIN 300 MG RE SUPP
300.0000 mg | Freq: Every day | RECTAL | Status: DC
  Filled 2020-01-07: qty 1

## 2020-01-07 MED ORDER — ADULT MULTIVITAMIN W/MINERALS CH
1.0000 | ORAL_TABLET | Freq: Every day | ORAL | Status: DC
  Administered 2020-01-08 – 2020-01-15 (×8): 1 via ORAL
  Filled 2020-01-07 (×8): qty 1

## 2020-01-07 MED ORDER — LORAZEPAM 2 MG/ML IJ SOLN: 1.0000 mg | INTRAMUSCULAR | Status: AC | PRN

## 2020-01-07 MED ORDER — ACETAMINOPHEN 650 MG RE SUPP: 650.0000 mg | RECTAL | Status: DC | PRN

## 2020-01-07 MED ORDER — THIAMINE HCL 100 MG/ML IJ SOLN
100.0000 mg | Freq: Every day | INTRAMUSCULAR | Status: DC
  Administered 2020-01-13: 100 mg via INTRAVENOUS
  Filled 2020-01-07 (×2): qty 2

## 2020-01-07 MED ORDER — ACETAMINOPHEN 325 MG PO TABS
650.0000 mg | ORAL_TABLET | ORAL | Status: DC | PRN
  Administered 2020-01-08 – 2020-01-15 (×5): 650 mg via ORAL
  Filled 2020-01-07 (×5): qty 2

## 2020-01-07 MED ORDER — LORAZEPAM 1 MG PO TABS: 1.0000 mg | ORAL_TABLET | ORAL | Status: AC | PRN

## 2020-01-07 MED ORDER — VANCOMYCIN HCL 2000 MG/400ML IV SOLN
2000.0000 mg | Freq: Once | INTRAVENOUS | Status: AC
  Administered 2020-01-07: 2000 mg via INTRAVENOUS
  Filled 2020-01-07: qty 400

## 2020-01-07 MED ORDER — LABETALOL HCL 5 MG/ML IV SOLN
20.0000 mg | Freq: Once | INTRAVENOUS | Status: DC
  Filled 2020-01-07: qty 4

## 2020-01-07 MED ORDER — SODIUM CHLORIDE 0.9 % IV SOLN: 2.0000 g | Freq: Three times a day (TID) | INTRAVENOUS | Status: DC

## 2020-01-07 NOTE — ED Notes (Signed)
ECHO at bedside.

## 2020-01-07 NOTE — H&P (Signed)
History and Physical    Derek [name] NFA:[id number] DOB: 29-Jan-1976 DOA: 01/07/2020  PCP: Patient, No Pcp Per Consultants:  None Patient coming from:  Home; NOK: Liliana Cline, [phone]  Chief Complaint: Code Stroke  HPI: Derek [name] is a 44 y.o. male with no known medical history presenting as Code Stroke.  The patient is mostly sleeping and awakes and appears appropriate but his conversation doesn't exactly make sense.  He complains of being cold - when I asked him to straighten his right leg out (it was behind his left leg), he says he was "just putting it behind this thing to keep it warm" and seems unaware completely of his left side.  He reports that he needs to call his work so he doesn't get fired, but then didn't actually want to use the phone. He reports headache.  I called and spoke with his sister.  She was notified this AM that he was taken here.  She reports that he went out Saturday night to a family member's birthday party; they took him home and he was very intoxicated.  He was placed on the bed after 0200.  His girlfriend noted him on the floor sometime later.  She found him there again sometime later and had someone help him into the bed.  He was not moving on one side.  He apparently fell back on the floor at some point.  This AM, she finally called 911.  He has chronic back pain from a remote accident.  He has been under a lot of stress - including with his live-in girlfriend.  He also has a 7yo son.   ED Course:  EMS called for a fall, noted left-sided weakness, facial droop, slurred speech, rightward gaze.  CT consistent with R MCA CVA.  Neuro has seen, MRI recommended.  Temp 100.5, given empiric abx, no symptoms or apparent source.  Review of Systems: As per HPI; otherwise review of systems reviewed and negative.  Limited accuracy  Ambulatory Status:  Ambulates without assistance  Past Medical History:  Diagnosis Date  . 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 Alcohol dependence (HCC)   . Back pain   . Marijuana dependence (HCC)   . Tobacco dependence     History reviewed. No pertinent surgical history.  Social History   Socioeconomic History  . Marital status: Single    Spouse name: Not on file  . Number of children: Not on file  . Years of education: Not on file  . Highest education level: Not on file  Occupational History  . Occupation: truck Hospital doctor  Tobacco Use  . Smoking status: Current Every Day Smoker    Packs/day: 0.20    Types: Cigarettes  . Smokeless tobacco: Never Used  Substance and Sexual Activity  . Alcohol use: Yes    Comment: 2-3 times a week.   . Drug use: Yes    Types: Marijuana  . Sexual activity: Yes    Birth control/protection: Condom  Other Topics Concern  . Not on file  Social History Narrative  . Not on file   Social Determinants of Health   Financial Resource Strain:   . Difficulty of Paying Living Expenses: Not on file  Food Insecurity:   . Worried About Programme researcher, broadcasting/film/video in the Last Year: Not on file  . Ran Out of Food in the Last Year: Not on file  Transportation Needs:   . Lack of Transportation (Medical): Not  on file  . Lack of Transportation (Non-Medical): Not on file  Physical Activity:   . Days of Exercise per Week: Not on file  . Minutes of Exercise per Session: Not on file  Stress:   . Feeling of Stress : Not on file  Social Connections:   . Frequency of Communication with Friends and Family: Not on file  . Frequency of Social Gatherings with Friends and Family: Not on file  . Attends Religious Services: Not on file  . Active Member of Clubs or Organizations: Not on file  . Attends Banker Meetings: Not on file  . Marital Status: Not on file  Intimate Partner Violence:   . Fear of Current or Ex-Partner: Not on file  . Emotionally Abused: Not on file  . Physically Abused: Not on file  . Sexually Abused: Not on file    No Known  Allergies  Family History  Problem Relation Age of Onset  . Stroke Neg Hx   . CAD Neg Hx     Prior to Admission medications   Medication Sig Start Date End Date Taking? Authorizing Provider  ferrous sulfate 325 (65 FE) MG tablet Take 325 mg by mouth 2 (two) times daily. 08/22/17  Yes [provider]  ibuprofen (ADVIL,MOTRIN) 200 MG tablet Take 400 mg by mouth every 6 (six) hours as needed. For pain   Yes [provider]  cyclobenzaprine (FLEXERIL) 10 MG tablet Take 1 tablet (10 mg total) by mouth 2 (two) times daily as needed for muscle spasms. Patient not taking: Reported on 01/07/2020 07/15/19   Frederica Kuster, MD  lidocaine (LIDODERM) 5 % Place 1 patch onto the skin daily. Remove & Discard patch within 12 hours or as directed by MD Patient not taking: Reported on 01/07/2020 11/23/17   Maczis, Elmer Sow, PA-C  meloxicam (MOBIC) 15 MG tablet Take 1 tablet (15 mg total) by mouth daily. Patient not taking: Reported on 01/07/2020 07/15/19   Frederica Kuster, MD  meloxicam (MOBIC) 15 MG tablet Take 1 tablet (15 mg total) by mouth daily. Patient not taking: Reported on 01/07/2020 07/15/19   Frederica Kuster, MD  methocarbamol (ROBAXIN) 500 MG tablet Take 1 tablet (500 mg total) by mouth 2 (two) times daily. Patient not taking: Reported on 01/07/2020 11/23/17   Princella Pellegrini    Physical Exam: Vitals:   01/07/20 1015 01/07/20 1030 01/07/20 1045 01/07/20 1135  BP: (!) 178/137 (!) 156/101 (!) 164/120 (!) 167/102  Pulse: (!) 124 79 78 79  Resp: (!) 29 (!) 24 19 (!) 26  Temp:      SpO2: 99% 99% 98% 99%  Weight:      Height:         . General:  Appears calm and comfortable and is NAD; he is sleeping comfortably and easily aroused but is not really seeming to fully engage in conversation . Eyes:  PERRL, EOMI, normal lids, iris, does not make good eye contact and maintains a mild right superolateral fixed gaze . ENT:  grossly normal hearing, lips & tongue, mmm; left sided  facial droop . Neck:  no LAD, masses or thyromegaly; no carotid bruits . Cardiovascular:  RRR, no m/r/g. No LE edema.  Marland Kitchen Respiratory:   CTA bilaterally with no wheezes/rales/rhonchi.  Normal respiratory effort. . Abdomen:  soft, NT, ND, NABS . Skin:  no rash or induration seen on limited exam . Musculoskeletal:  Marked left-sided hemiplegia with no apparent awareness of his  left side at all (his right leg was propped behind "that thing" which happened to be his left leg) . Psychiatric:  blunted mood and affect, speech fluent but somewhat inappropriate . Neurologic:  Left facial droop, marked left-sided neglect with left hemiplegia    Radiological Exams on Admission: CT Head Wo Contrast  Result Date: 01/07/2020 CLINICAL DATA:  Pt came in [facility] from home with c/o of stroke symptoms. Pt was LSN at 0200 on Saturday. Pt has L-Sided Deficits, L-Sided Facial Droop, R-Sided Gaze. EMS states patient has alcohol on Friday and had a fall on Saturday and one again this morning. Pt is complaining of Head Pain. Pt had an episode of Incontinence. Pt present with untreated HTN. EXAM: CT HEAD WITHOUT CONTRAST TECHNIQUE: Contiguous axial images were obtained from the base of the skull through the vertex without intravenous contrast. COMPARISON:  10/29/2006 FINDINGS: Brain: There is patchy hypoattenuation involving the right basal ganglia including the external and internal capsules with hypoattenuation noted in a more patchy distribution extending superiorly and laterally into the posterior right frontal and adjacent right parietal lobes. Within the area of hypoattenuation, there is relative increased attenuation in portions of the caudate nucleus head and globus pallidus and putamen, which may reflect areas of petechial hemorrhage. There is associated mass effect partial effacement of the anterior horn and body of the right lateral ventricle, and slight midline shift to the left of 2-3 mm. No other areas of abnormal  attenuation to suggest additional areas of infarct. No discrete masses. No evidence of hemorrhage other than the possible petechial hemorrhage in the right basal ganglia. No hydrocephalus. Vascular: Focus of increased attenuation noted right middle cerebral artery is suspicious for thrombus/embolus. Skull: Normal. Negative for fracture or focal lesion. Sinuses/Orbits: Globes and orbits are unremarkable. Small amount of dependent fluid in the left sphenoid sinus. Other: None. IMPRESSION: 1. Recent M1 segment, right middle cerebral artery infarct, with mass effect and possible petechial hemorrhage in portions of the right basal ganglia. Infarct extends to the posterosuperior right frontal and adjacent right parietal lobes. Suspect thrombus/embolus in the M1 segment of right middle cerebral artery. 2. No other acute or recent intracranial abnormality. Electronically Signed   By: Amie Portland M.D.   On: 01/07/2020 07:40   DG Chest Port 1 View  Result Date: 01/07/2020 CLINICAL DATA:  Rule out infection.  Stroke symptoms EXAM: PORTABLE CHEST 1 VIEW COMPARISON:  12/07/2010 FINDINGS: Normal heart size and mediastinal contours. There is no edema, consolidation, effusion, or pneumothorax. Artifact from EKG leads. IMPRESSION: No evidence of active disease. Electronically Signed   By: Marnee Spring M.D.   On: 01/07/2020 07:44    EKG: Independently reviewed.  NSR with rate 75; nonspecific ST changes with no evidence of acute ischemia   Labs on Admission: I have personally reviewed the available labs and imaging studies at the time of the admission.  Pertinent labs:   Glucose 134 AST 43/ALT 31 CK 787 Lactate 1.9 WBC 14.3 INR 1.0 UA: Small Hgb, 5 ketones, 100 protein Blood cultures pending x 2 Urine culture pending BD COVID negative   Assessment/Plan Principal Problem:   Acute ischemic right MCA stroke (HCC) Active Problems:   Tobacco dependence   Marijuana dependence (HCC)   Alcohol dependence  (HCC)    CVA -Patient last seen normal about 0200 Sunday AM, with recurrent falls  -Significant left-sided deficits but also apparent cognitive impairment on exam -CT with subacute R MCA strokes -Will admit for further CVA evaluation -Telemetry  monitoring -MRI/MRA -Carotid dopplers -Echo -Risk stratification with FLP, A1c; will also check TSH and UDS -ASA daily -Neurology consult -PT/OT/ST/Nutrition Consults -SW consult for placement - he seems almost certain to need CIR vs. SNF rehab -Check FLP; will start empiric Lipitor 40 mg daily  HTN -No known h/o HTN but he also does not appear to see doctors regularly -He is out of the window for permissive HTN -Will start IV lopressor for now   ETOH dependence -Patient with chronic ETOH dependence -He is at increased risk for complications of withdrawal including seizures, DTs -Will observe on CIWA protocol  Marijuana dependence -Cessation should be encouraged on an ongoing basis -UDS ordered  Tobacco dependence -Encourage cessation.   -Patch ordered       Note: This patient has been tested and is pending for the novel coronavirus COVID-19.     DVT prophylaxis:  Lovenox  Code Status: Full  Family Communication: None present; I spoke with his sister at length at the time of admission Disposition Plan:  He has significant deficits from his CVA and is likely to require either SNF rehab or CIR Consults called: Neurology; PT/OT/ST/TOC team/Nutrition  Admission status: Admit - It is my clinical opinion that admission to INPATIENT is reasonable and necessary because of the expectation that this patient will require hospital care that crosses at least 2 midnights to treat this condition based on the medical complexity of the problems presented.  Given the aforementioned information, the predictability of an adverse outcome is felt to be significant.     [name] MD [facility]   How to contact the [facility] Attending  or Consulting provider [place] or covering provider during after hours [place], for this patient?  1. Check the care team in [facility] and look for a) attending/consulting [facility] provider listed and b) the [facility] team listed 2. Log into [email or web address] and use [place] universal password to access. If you do not have the password, please contact the hospital operator. 3. Locate the [facility] provider you are looking for under [facility] and page to a number that you can be directly reached. 4. If you still have difficulty reaching the provider, please page the [facility] (Director on Call) for the Hospitalists listed on amion for assistance.   01/07/2020, 1:11 PM

## 2020-01-07 NOTE — ED Notes (Signed)
Pt 1st Lactic normal. 2nd to be discontinued.

## 2020-01-07 NOTE — Progress Notes (Signed)
Pharmacy Antibiotic Note  Derek [name] is a 44 y.o. male admitted on 01/07/2020 with concern for infection from unknown source.  Pharmacy has been consulted for vancomycin and cefepime dosing.  WBC 14.3, T 100.5, RR 24.    Plan: Vancomycin 2000 mg IV x 1, then 1500 mg IV every 12 hours Goal AUC 400-550. Expected AUC: 516 SCr used: 1.19 Cefepime 2g IV every 8 hours Monitor renal function, Cx and clinical progression to narrow Vancomycin levels at steady state   Height: 6\' 2"  (188 cm) Weight: 220 lb (99.8 kg) IBW/kg (Calculated) : 82.2  Temp (24hrs), Avg:100.5 F (38.1 C), Min:100.5 F (38.1 C), Max:100.5 F (38.1 C)  Recent Labs  Lab 01/07/20 0703  WBC 14.3*  CREATININE 1.19    Estimated Creatinine Clearance: 99.9 mL/min (by C-G formula based on SCr of 1.19 mg/dL).    No Known Allergies  Antimicrobials this admission: Vanc 3/8>> Cefepime 3/8>>  Dose adjustments this admission: n/a  Microbiology results: 3/8 BCx: sent 3/8 UCx: sent  5/8, PharmD Clinical Pharmacist ED Pharmacist Phone # [phone] 01/07/2020 8:30 AM

## 2020-01-07 NOTE — Consult Note (Signed)
NEURO HOSPITALIST CONSULT NOTE   Requestig physician: Dr. Denton Lank  Reason for Consult: Left sided weakness since Friday  History obtained from:  Chart     HPI:                                                                                                                                          Derek [name] is an 44 y.o. male presenting with new onset of left sided weakness. History is obtained from the chart as he is unable to provide a history due to somnolence. He was last known normal at 0200 on Saturday. He had a fall on Saturday and another fall this morning. EMS was called and on their initial assessment the patient was weak on his left side with right sided gaze deviation. He was complaining of head pain and also had an episode of incontinence. His fiancee reported that he had been drinking on Friday, which was unusual for him. She also noted that he had been "in and out" for the past 2 days with frequent falls. She first noticed the left sided weakness on Sunday evening after she returned home and found him on the floor again. BP in the field was 184/110. CBG 158 with normal oxygen saturation on RA.    PMHx Uncontrolled HTN  No past surgical history on file.  No family history on file.            Social History:  reports that he has been smoking cigarettes. He has been smoking about 0.20 packs per day. He has never used smokeless tobacco. He reports current alcohol use. He reports that he does not use drugs.  No Known Allergies  HOME MEDICATIONS:                                                                                                                      No current facility-administered medications on file prior to encounter.   Current Outpatient Medications on File Prior to Encounter  Medication Sig Dispense Refill  . cyclobenzaprine (FLEXERIL) 10 MG tablet Take 1 tablet (10 mg total) by mouth 2 (two) times daily as needed for muscle spasms. 20  tablet 0  . ferrous sulfate 325 (65 FE) MG tablet Take 325 mg by mouth  2 (two) times daily.  2  . ibuprofen (ADVIL,MOTRIN) 200 MG tablet Take 400 mg by mouth every 6 (six) hours as needed. For pain    . lidocaine (LIDODERM) 5 % Place 1 patch onto the skin daily. Remove & Discard patch within 12 hours or as directed by MD 30 patch 0  . meloxicam (MOBIC) 15 MG tablet Take 1 tablet (15 mg total) by mouth daily. 15 tablet 0  . meloxicam (MOBIC) 15 MG tablet Take 1 tablet (15 mg total) by mouth daily. 30 tablet 0  . methocarbamol (ROBAXIN) 500 MG tablet Take 1 tablet (500 mg total) by mouth 2 (two) times daily. 20 tablet 0     ROS:                                                                                                                                       As per HPI. Unable to obtain additional ROS due to somnolence.   Blood pressure (!) 169/106, pulse 82, temperature (!) 100.5 F (38.1 C), resp. rate (!) 21, height 6\' 2"  (1.88 m), weight 99.8 kg, SpO2 99 %.   General Examination:                                                                                                       Physical Exam  HEENT-  Damon/AT    Lungs- Respirations unlabored Extremities- No edema  Neurological Examination Mental Status: Somnolent, requiring constant stimuli to maintain a drowsy state and follow commands. Able to name objects and follow right-sided commands. Speech is fluent with no significant dysarthria. Anosognosia and left sided neglect.  Cranial Nerves: II: States "both" when temporal visual fields are tested with double simultaneous stimulation. Briefly fixates on examiner's face when aroused. PERRL.  III,IV, VI: Mild left sided ptosis. Eyes are mildly exotropic with gaze slightly to the right of midline. Will not track to the left or right.  V,VII: Left facial droop. Decreased sensation to left side of face.  VIII: hearing intact to voice IX,X: No hoarseness.  XI: Head is midline XII:  midline tongue extension - protrudes tongue about 1 cm Motor: RUE and RLE 5/5 LUE flaccid tone with no volitional movement or withdrawal to pinch  LLE with triple flexion response to plantar stimulation. No volitional movement.  Sensory: Absent FT sensation to LUE and LLE. Able to perceive pinch and deep pressure.  Deep Tendon Reflexes: 1+ bilateral patellae and brachioradialis.  Plantars: Right: downgoing  Left: upgoing Cerebellar: No ataxia with FNF on the right. Unable to perform on the left.  Gait: Unable to assess   Lab Results: Basic Metabolic Panel: Recent Labs  Lab 01/07/20 0703  NA 140  K 4.0  CL 102  CO2 23  GLUCOSE 134*  BUN 8  CREATININE 1.19  CALCIUM 9.6    CBC: Recent Labs  Lab 01/07/20 0703  WBC 14.3*  NEUTROABS 12.6*  HGB 14.8  HCT 44.7  MCV 88.9  PLT 314    Cardiac Enzymes: No results for input(s): CKTOTAL, CKMB, CKMBINDEX, TROPONINI in the last 168 hours.  Lipid Panel: No results for input(s): CHOL, TRIG, HDL, CHOLHDL, VLDL, LDLCALC in the last 168 hours.  Imaging: CT Head Wo Contrast  Result Date: 01/07/2020 CLINICAL DATA:  Pt came in [facility] from home with c/o of stroke symptoms. Pt was LSN at 0200 on Saturday. Pt has L-Sided Deficits, L-Sided Facial Droop, R-Sided Gaze. EMS states patient has alcohol on Friday and had a fall on Saturday and one again this morning. Pt is complaining of Head Pain. Pt had an episode of Incontinence. Pt present with untreated HTN. EXAM: CT HEAD WITHOUT CONTRAST TECHNIQUE: Contiguous axial images were obtained from the base of the skull through the vertex without intravenous contrast. COMPARISON:  10/29/2006 FINDINGS: Brain: There is patchy hypoattenuation involving the right basal ganglia including the external and internal capsules with hypoattenuation noted in a more patchy distribution extending superiorly and laterally into the posterior right frontal and adjacent right parietal lobes. Within the area of  hypoattenuation, there is relative increased attenuation in portions of the caudate nucleus head and globus pallidus and putamen, which may reflect areas of petechial hemorrhage. There is associated mass effect partial effacement of the anterior horn and body of the right lateral ventricle, and slight midline shift to the left of 2-3 mm. No other areas of abnormal attenuation to suggest additional areas of infarct. No discrete masses. No evidence of hemorrhage other than the possible petechial hemorrhage in the right basal ganglia. No hydrocephalus. Vascular: Focus of increased attenuation noted right middle cerebral artery is suspicious for thrombus/embolus. Skull: Normal. Negative for fracture or focal lesion. Sinuses/Orbits: Globes and orbits are unremarkable. Small amount of dependent fluid in the left sphenoid sinus. Other: None. IMPRESSION: 1. Recent M1 segment, right middle cerebral artery infarct, with mass effect and possible petechial hemorrhage in portions of the right basal ganglia. Infarct extends to the posterosuperior right frontal and adjacent right parietal lobes. Suspect thrombus/embolus in the M1 segment of right middle cerebral artery. 2. No other acute or recent intracranial abnormality. Electronically Signed   By: Lajean Manes M.D.   On: 01/07/2020 07:40   DG Chest Port 1 View  Result Date: 01/07/2020 CLINICAL DATA:  Rule out infection.  Stroke symptoms EXAM: PORTABLE CHEST 1 VIEW COMPARISON:  12/07/2010 FINDINGS: Normal heart size and mediastinal contours. There is no edema, consolidation, effusion, or pneumothorax. Artifact from EKG leads. IMPRESSION: No evidence of active disease. Electronically Signed   By: Monte Fantasia M.D.   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 There is probable petechial hemorrhagic transformation of the  right basal ganglia stroke based on CT appearance.  1. Exam reveals findings consistent with relatively high stroke burden in the right cerebral hemisphere.  2. Right MCA is hyperdense on CT, suggestive of thrombus.  3. Not an interventional or tPA candidate based on time criteria 4. Elevated white count and fever  Recommendations: 1. HgbA1c, fasting lipid panel 2. MRI/MRA of the brain without contrast 3. PT consult, OT consult, Speech consult 4. Echocardiogram 5. Start atorvastatin 80 mg po qd. Obtaining baseline CK level.  6. Prophylactic therapy- Start 300 mg per rectum qd. He is somnolent and most likely is unable to take PO currently.    7. Risk factor modification 8. Telemetry monitoring 9. Frequent neuro checks 10 NPO until passes stroke swallow screen 12. BP management. Out of permissive HTN time window.  13. May be a candidate for hypertonic saline, depending upon MRI results.  14. Please page stroke NP  Or  PA  Or MD from 8am -4 pm  as this patient from this time will be  followed by the stroke.   You can look them up on [email or web address]  Password [email or web address]     Electronically signed: Dr. Caryl Pina 01/07/2020, 9:14 AM

## 2020-01-07 NOTE — ED Notes (Signed)
PT transported MRI

## 2020-01-07 NOTE — ED Notes (Signed)
Update given to sister.

## 2020-01-07 NOTE — Progress Notes (Signed)
RN called fiance, no answer. Sister called and wanted updates but pt is sleeping.

## 2020-01-07 NOTE — ED Notes (Signed)
PT is back from MRI 

## 2020-01-07 NOTE — ED Triage Notes (Signed)
Pt came in [facility] from home with c/o of stroke symptoms. Pt was LSN at 0200 on Saturday. Pt has L-Sided Deficits, L-Sided Facial Droop, R-Sided Gaze. EMS states patient has alcohol on Friday and had a fall on Saturday and one again this morning. Pt is complaining of Head Pain. Pt had an episode of Incontinence. Pt present with untreated HTN.  184/110, 74HR, 99%RA, 158CBG 18GIV R&L AC

## 2020-01-07 NOTE — Progress Notes (Signed)
Txt paged Neuro because Pt has scheduled Lopressor and no parameters. Stroke RMCA and right sided petechial hemorrhage. Wilford Corner, MD stated to go ahead and give it. BP now 172/103.

## 2020-01-07 NOTE — ED Provider Notes (Signed)
[facility] EMERGENCY DEPARTMENT Provider Note   CSN: [id number] Arrival date & time: 01/07/20  6213     History No chief complaint on file.   Derek [name] is a 44 y.o. male who presents to the ED via EMS with stroke like symptoms. EMS initially called out for fall this morning. Pt had been drinking EtOH Friday and had a fall Saturday morning and then again this morning. When EMS arrived they noted left sided weakness with left sided facial droop and right sided gaze. LKN 2 AM Saturday. Pt with hx of untreated HTN not currently on medications. On EMS arrival blood pressure 184/110.  Blood pressure in the ED 144/89 without intervention. Pt was found to be incontinent of urine on EMS arrival as well.   Pt has no complaints currently besides being cold. Temperature in the ED 100.5.   History obtained by fiancee who reports that pt had been drinking Friday evening which is atypical for him. She reports he has been "in and out" for the past 2 days and has had frequent falls. She states she did not notice the left sided weakness until yesterday evening when she came home after checking on her mother and noticed patient on the floor again.   The history is provided by the patient, a friend, the EMS personnel and medical records.       Past Medical History:  Diagnosis Date  . 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 Alcohol dependence (HCC)   . Back pain   . Marijuana dependence (HCC)   . Tobacco dependence     Patient Active Problem List   Diagnosis Date Noted  . 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 Tobacco dependence   . Marijuana dependence (HCC)   . Alcohol dependence (HCC)     History reviewed. No pertinent surgical history.     Family History  Problem Relation Age of Onset  . Stroke Neg Hx   . CAD Neg Hx     Social History   Tobacco Use  . Smoking status: Current Every Day Smoker    Packs/day: 0.20    Types:  Cigarettes  . Smokeless tobacco: Never Used  Substance Use Topics  . Alcohol use: Yes    Comment: 2-3 times a week.   . Drug use: Yes    Types: Marijuana    Home Medications Prior to Admission medications   Medication Sig Start Date End Date Taking? Authorizing Provider  ferrous sulfate 325 (65 FE) MG tablet Take 325 mg by mouth 2 (two) times daily. 08/22/17  Yes [provider]  ibuprofen (ADVIL,MOTRIN) 200 MG tablet Take 400 mg by mouth every 6 (six) hours as needed. For pain   Yes [provider]    Allergies    Patient has no known allergies.  Review of Systems   Review of Systems  Constitutional: Positive for fever.  Respiratory: Negative for shortness of breath.   Cardiovascular: Negative for chest pain.  Gastrointestinal: Negative for abdominal pain.  Neurological: Positive for weakness.  All other systems reviewed and are negative.   Physical Exam Updated Vital Signs BP (!) 144/89 (BP Location: Right Arm)   Pulse 94   Temp (!) 100.5 F (38.1 C)   Resp 20   Ht  (1.88 m)   Wt 99.8 kg   SpO2 98%   BMI 28.25 kg/m   Physical Exam Vitals and nursing note reviewed.  Constitutional:  Appearance: He is not ill-appearing or diaphoretic.  HENT:     Head: Normocephalic and atraumatic.  Eyes:     Extraocular Movements: Extraocular movements intact.     Conjunctiva/sclera: Conjunctivae normal.     Pupils: Pupils are equal, round, and reactive to light.  Cardiovascular:     Rate and Rhythm: Normal rate and regular rhythm.     Pulses: Normal pulses.  Pulmonary:     Effort: Pulmonary effort is normal.     Breath sounds: Normal breath sounds. No wheezing, rhonchi or rales.  Abdominal:     Palpations: Abdomen is soft.     Tenderness: There is no abdominal tenderness. There is no guarding or rebound.  Musculoskeletal:     Cervical back: Neck supple.  Skin:    General: Skin is warm and dry.  Neurological:     Mental Status: He is alert.       Comments: Right sided gaze however able to tract without difficulty. Left sided facial droop.  A&O x4 GCS 15 Unable to move LUE and LLE however does react to painful stimuli with movement. Normal 5/5 strength to RUE/RLE.  Coordination with finger-to-nose WNL on right side, unable to participate for left side     ED Results / Procedures / Treatments   Labs (all labs ordered are listed, but only abnormal results are displayed) Labs Reviewed  COMPREHENSIVE METABOLIC PANEL - Abnormal; Notable for the following components:      Result Value   Glucose, Bld 134 (*)    AST 43 (*)    All other components within normal limits  CBC WITH DIFFERENTIAL/PLATELET - Abnormal; Notable for the following components:   WBC 14.3 (*)    RDW 18.3 (*)    Neutro Abs 12.6 (*)    Abs Immature Granulocytes 0.09 (*)    All other components within normal limits  URINALYSIS, ROUTINE W REFLEX MICROSCOPIC - Abnormal; Notable for the following components:   Hgb urine dipstick SMALL (*)    Ketones, ur 5 (*)    Protein, ur 100 (*)    All other components within normal limits  CK - Abnormal; Notable for the following components:   Total CK 787 (*)    All other components within normal limits  RAPID URINE DRUG SCREEN, HOSP PERFORMED - Abnormal; Notable for the following components:   Tetrahydrocannabinol POSITIVE (*)    All other components within normal limits  SARS CORONAVIRUS 2 (TAT 6-24 HRS)  URINE CULTURE  CULTURE, BLOOD (ROUTINE X 2)  LACTIC ACID, PLASMA  APTT  PROTIME-INR  HIV ANTIBODY (ROUTINE TESTING W REFLEX)  TSH  PHOSPHORUS  MAGNESIUM  POC SARS CORONAVIRUS 2 AG -  ED    EKG EKG Interpretation  Date/Time:  Monday January 07 2020 07:16:35 EST Ventricular Rate:  75 PR Interval:    QRS Duration: 108 QT Interval:  395 QTC Calculation: 442 R Axis:   -61 Text Interpretation: Sinus rhythm Left axis deviation Non-specific ST-t changes No previous tracing Confirmed by Cathren Laine ([id number]) on  01/07/2020 7:27:54 AM   Radiology CT Head Wo Contrast  Result Date: 01/07/2020 CLINICAL DATA:  Pt came in [facility] from home with c/o of stroke symptoms. Pt was LSN at 0200 on Saturday. Pt has L-Sided Deficits, L-Sided Facial Droop, R-Sided Gaze. EMS states patient has alcohol on Friday and had a fall on Saturday and one again this morning. Pt is complaining of Head Pain. Pt had an episode of Incontinence. Pt present with untreated HTN.  EXAM: CT HEAD WITHOUT CONTRAST TECHNIQUE: Contiguous axial images were obtained from the base of the skull through the vertex without intravenous contrast. COMPARISON:  10/29/2006 FINDINGS: Brain: There is patchy hypoattenuation involving the right basal ganglia including the external and internal capsules with hypoattenuation noted in a more patchy distribution extending superiorly and laterally into the posterior right frontal and adjacent right parietal lobes. Within the area of hypoattenuation, there is relative increased attenuation in portions of the caudate nucleus head and globus pallidus and putamen, which may reflect areas of petechial hemorrhage. There is associated mass effect partial effacement of the anterior horn and body of the right lateral ventricle, and slight midline shift to the left of 2-3 mm. No other areas of abnormal attenuation to suggest additional areas of infarct. No discrete masses. No evidence of hemorrhage other than the possible petechial hemorrhage in the right basal ganglia. No hydrocephalus. Vascular: Focus of increased attenuation noted right middle cerebral artery is suspicious for thrombus/embolus. Skull: Normal. Negative for fracture or focal lesion. Sinuses/Orbits: Globes and orbits are unremarkable. Small amount of dependent fluid in the left sphenoid sinus. Other: None. IMPRESSION: 1. Recent M1 segment, right middle cerebral artery infarct, with mass effect and possible petechial hemorrhage in portions of the right basal ganglia. Infarct  extends to the posterosuperior right frontal and adjacent right parietal lobes. Suspect thrombus/embolus in the M1 segment of right middle cerebral artery. 2. No other acute or recent intracranial abnormality. Electronically Signed   By: Amie Portland M.D.   On: 01/07/2020 07:40   MR ANGIO HEAD WO CONTRAST  Result Date: 01/07/2020 CLINICAL DATA:  Stroke. Last seen normal 0200 hours on Saturday. Left-sided deficits. EXAM: MRI HEAD WITHOUT CONTRAST MRA HEAD WITHOUT CONTRAST TECHNIQUE: Multiplanar, multiecho pulse sequences of the brain and surrounding structures were obtained without intravenous contrast. Angiographic images of the head were obtained using MRA technique without contrast. COMPARISON:  CT head 01/07/2020 FINDINGS: MRI HEAD FINDINGS Brain: Acute infarct right posterior MCA territory involving the right parietal cortex extending over the medial convexity. Patchy acute infarct also in the right basal ganglia with moderate amount of associated hemorrhage. This hemorrhage is mildly hyperdense on CT most likely hemorrhagic transformation of basal ganglia infarct on the right. There is local mass-effect on the right lateral ventricle with minimal midline shift to the left. No hydrocephalus. No mass lesion. Vascular: Normal arterial flow voids. Skull and upper cervical spine: No focal skull lesion. Sinuses/Orbits: Mucosal edema paranasal sinuses with air-fluid level in the sphenoid sinus. Negative orbit Other: None MRA HEAD FINDINGS Both vertebral arteries widely patent to the basilar. PICA patent bilaterally. Basilar widely patent. Superior cerebellar and posterior cerebral arteries widely patent bilaterally. Left internal carotid artery widely patent. Left anterior and middle cerebral arteries widely patent. Azygos A2 segment. Right internal carotid artery widely patent. Right A1 segment patent. Common A2 segment. Right M1 segment patent. Moderate stenosis in the anterior division of the right M2 branch.  No large vessel occlusion. IMPRESSION: Acute right MCA territory involving basal ganglia and right parietal lobe. There is hemorrhagic transformation of the right basal ganglia infarct with mass-effect on the lateral ventricle. Right M1 segment widely patent. Moderate stenosis right M2 segment anteriorly. Presumably the thrombus has recanalized given the symptoms are greater than 48 hours old. Electronically Signed   By: Marlan Palau M.D.   On: 01/07/2020 15:22   MR BRAIN WO CONTRAST  Result Date: 01/07/2020 CLINICAL DATA:  Stroke. Last seen normal 0200 hours on Saturday. Left-sided  deficits. EXAM: MRI HEAD WITHOUT CONTRAST MRA HEAD WITHOUT CONTRAST TECHNIQUE: Multiplanar, multiecho pulse sequences of the brain and surrounding structures were obtained without intravenous contrast. Angiographic images of the head were obtained using MRA technique without contrast. COMPARISON:  CT head 01/07/2020 FINDINGS: MRI HEAD FINDINGS Brain: Acute infarct right posterior MCA territory involving the right parietal cortex extending over the medial convexity. Patchy acute infarct also in the right basal ganglia with moderate amount of associated hemorrhage. This hemorrhage is mildly hyperdense on CT most likely hemorrhagic transformation of basal ganglia infarct on the right. There is local mass-effect on the right lateral ventricle with minimal midline shift to the left. No hydrocephalus. No mass lesion. Vascular: Normal arterial flow voids. Skull and upper cervical spine: No focal skull lesion. Sinuses/Orbits: Mucosal edema paranasal sinuses with air-fluid level in the sphenoid sinus. Negative orbit Other: None MRA HEAD FINDINGS Both vertebral arteries widely patent to the basilar. PICA patent bilaterally. Basilar widely patent. Superior cerebellar and posterior cerebral arteries widely patent bilaterally. Left internal carotid artery widely patent. Left anterior and middle cerebral arteries widely patent. Azygos A2 segment.  Right internal carotid artery widely patent. Right A1 segment patent. Common A2 segment. Right M1 segment patent. Moderate stenosis in the anterior division of the right M2 branch. No large vessel occlusion. IMPRESSION: Acute right MCA territory involving basal ganglia and right parietal lobe. There is hemorrhagic transformation of the right basal ganglia infarct with mass-effect on the lateral ventricle. Right M1 segment widely patent. Moderate stenosis right M2 segment anteriorly. Presumably the thrombus has recanalized given the symptoms are greater than 48 hours old. Electronically Signed   By: Marlan Palau M.D.   On: 01/07/2020 15:22   DG Chest Port 1 View  Result Date: 01/07/2020 CLINICAL DATA:  Rule out infection.  Stroke symptoms EXAM: PORTABLE CHEST 1 VIEW COMPARISON:  12/07/2010 FINDINGS: Normal heart size and mediastinal contours. There is no edema, consolidation, effusion, or pneumothorax. Artifact from EKG leads. IMPRESSION: No evidence of active disease. Electronically Signed   By: Marnee Spring M.D.   On: 01/07/2020 07:44   VAS US CAROTID (at [facility] and [facility] only)  Result Date: 01/07/2020 Carotid Arterial Duplex Study Indications:       CVA. Risk Factors:      Prior CVA. Comparison Study:  no prior Performing Technologist: Blanch Media RVS  Examination Guidelines: A complete evaluation includes B-mode imaging, spectral Doppler, color Doppler, and power Doppler as needed of all accessible portions of each vessel. Bilateral testing is considered an integral part of a complete examination. Limited examinations for reoccurring indications may be performed as noted.  Right Carotid Findings: +----------+--------+--------+--------+------------------+--------+           PSV cm/sEDV cm/sStenosisPlaque DescriptionComments +----------+--------+--------+--------+------------------+--------+ CCA Prox  75      7               heterogenous                +----------+--------+--------+--------+------------------+--------+ CCA Distal57      13              heterogenous               +----------+--------+--------+--------+------------------+--------+ ICA Prox  85      36      1-39%   heterogenous      tortuous +----------+--------+--------+--------+------------------+--------+ ICA Distal132     60  tortuous +----------+--------+--------+--------+------------------+--------+ ECA       81      14                                         +----------+--------+--------+--------+------------------+--------+ +----------+--------+-------+--------+-------------------+           PSV cm/sEDV cmsDescribeArm Pressure (mmHG) +----------+--------+-------+--------+-------------------+ [id number]      17                                 +----------+--------+-------+--------+-------------------+ +---------+--------+--+--------+--+---------+ VertebralPSV cm/s60EDV cm/s24Antegrade +---------+--------+--+--------+--+---------+  Left Carotid Findings: +----------+--------+--------+--------+------------------+--------+           PSV cm/sEDV cm/sStenosisPlaque DescriptionComments +----------+--------+--------+--------+------------------+--------+ CCA Prox  77      17              heterogenous               +----------+--------+--------+--------+------------------+--------+ CCA Distal71      18              heterogenous               +----------+--------+--------+--------+------------------+--------+ ICA Prox  59      21      1-39%   heterogenous               +----------+--------+--------+--------+------------------+--------+ ICA Distal66      24                                         +----------+--------+--------+--------+------------------+--------+ ECA       51      10                                          +----------+--------+--------+--------+------------------+--------+ +----------+--------+--------+--------+-------------------+           PSV cm/sEDV cm/sDescribeArm Pressure (mmHG) +----------+--------+--------+--------+-------------------+ [id number]                                          +----------+--------+--------+--------+-------------------+ +---------+--------+--+--------+--+---------+ VertebralPSV cm/s73EDV cm/s13Antegrade +---------+--------+--+--------+--+---------+   Summary: Right Carotid: Velocities in the right ICA are consistent with a 1-39% stenosis. Left Carotid: Velocities in the left ICA are consistent with a 1-39% stenosis. Vertebrals: Bilateral vertebral arteries demonstrate antegrade flow. *See table(s) above for measurements and observations.     Preliminary     Procedures .Critical Care Performed by: Tanda Rockers, PA-C Authorized by: Tanda Rockers, PA-C   Critical care provider statement:    Critical care time (minutes):  45   Critical care was necessary to treat or prevent imminent or life-threatening deterioration of the following conditions:  CNS failure or compromise   Critical care was time spent personally by me on the following activities:  Discussions with consultants, evaluation of patient's response to treatment, examination of patient, ordering and performing treatments and interventions, ordering and review of laboratory studies, ordering and review of radiographic studies, pulse oximetry, re-evaluation of patient's condition, obtaining history from patient or surrogate and review of old charts   (including critical care time)  Medications Ordered in ED Medications  labetalol (NORMODYNE) injection  20 mg (0 mg Intravenous Hold 01/07/20 1054)  enoxaparin (LOVENOX) injection 40 mg (has no administration in time range)   stroke: mapping our early stages of recovery book (has no administration in time range)  0.9 %  sodium chloride  infusion (has no administration in time range)  acetaminophen (TYLENOL) tablet 650 mg (has no administration in time range)    Or  acetaminophen (TYLENOL) 160 MG/5ML solution 650 mg (has no administration in time range)    Or  acetaminophen (TYLENOL) suppository 650 mg (has no administration in time range)  senna-docusate (Senokot-S) tablet 1 tablet (has no administration in time range)  aspirin suppository 300 mg (has no administration in time range)    Or  aspirin tablet 325 mg (has no administration in time range)  metoprolol tartrate (LOPRESSOR) injection 5 mg (has no administration in time range)  atorvastatin (LIPITOR) tablet 40 mg (has no administration in time range)  LORazepam (ATIVAN) tablet 1-4 mg (has no administration in time range)    Or  LORazepam (ATIVAN) injection 1-4 mg (has no administration in time range)  thiamine tablet 100 mg (has no administration in time range)    Or  thiamine (B-1) injection 100 mg (has no administration in time range)  folic acid (FOLVITE) tablet 1 mg (has no administration in time range)  multivitamin with minerals tablet 1 tablet (has no administration in time range)  nicotine (NICODERM CQ - dosed in mg/24 hr) patch 7 mg (has no administration in time range)  ceFEPIme (MAXIPIME) 2 g in sodium chloride 0.9 % 100 mL IVPB (0 g Intravenous Stopped 01/07/20 0932)  metroNIDAZOLE (FLAGYL) IVPB 500 mg (0 mg Intravenous Stopped 01/07/20 1041)  vancomycin (VANCOREADY) IVPB 2000 mg/400 mL (0 mg Intravenous Stopped 01/07/20 1135)  sodium chloride 0.9 % bolus 1,000 mL (1,000 mLs Intravenous New Bag/Given 01/07/20 1143)    ED Course  I have reviewed the triage vital signs and the nursing notes.  Pertinent labs & imaging results that were available during my care of the patient were reviewed by me and considered in my medical decision making (see chart for details).  Clinical Course as of Jan 07 1540  Mon Jan 07, 2020  0744 Acute stroke  CT Head Wo Contrast  [MV]  0817 Temp(!): 100.5 F (38.1 C) [MV]  0817 WBC(!): 14.3 [MV]  0838 SARS Coronavirus 2 Ag: NEGATIVE [MV]    Clinical Course User Index [MV] Tanda RockersVenter, Safina Huard, PA-C   44 year old male presenting to the ED with left sided weakness, l facial droop, and right eyeward deviation with LKN 2 AM Saturday (2 days ago).  Arrival to the ED patient has febrile 100.5.  Nontachycardic and nontachypneic.  No cmplaints at this time.  Obtain CT head at this time however patient is out of the window for TPA as well as interventional radiology for large vessel occlusion given 48+ hours out. Will work up infection at this time however patient may have temperature due to recent stroke.  Will obtain Covid swab.  CT head positive for subacute MCA stroke. Will consult neuro at this time.   CBC with leukocytosis 14,000. CXR clear. Still awaiting urine. Given fever and leukocytosis will empirically start on abx of unknown source.  CMP with glucose 134. No other electroltye abnormalities.  Rapid COVID negative.   Discussed case with Dr. Otelia LimesLindzen with neuro who will come evaluate patient and put in recommendations. Please see their note.   Urinalysis with RBCs however no leuks or nitrites  today. Very possible pt's fever and leukocytosis from his stroke. Will call for admission.   Discussed case with Dr. Lorin Mercy who agrees to accept for admission.  MDM Rules/Calculators/A&P                       Final Clinical Impression(s) / ED Diagnoses Final diagnoses:  Cerebrovascular accident (CVA) due to thrombosis of right middle cerebral artery [facility]    Rx / DC Orders ED Discharge Orders    None       Eustaquio Maize, PA-C 01/07/20 1541    Lajean Saver, MD 01/08/20 [phone]

## 2020-01-07 NOTE — Progress Notes (Signed)
Lower extremity venous has been completed.   Preliminary results in CV Proc.   Blanch Media 01/07/2020 2:11 PM

## 2020-01-07 NOTE — Progress Notes (Signed)
MRI brain: Acute right MCA territory involving basal ganglia and right parietal  lobe. There is hemorrhagic transformation of the right basal ganglia infarct with mass-effect on the lateral ventricle. Right M1 segment widely patent. Moderate stenosis right M2 segment anteriorly. Presumably the thrombus has recanalized given the symptoms are greater than 48 hours old.  MRA head: Both vertebral arteries widely patent to the basilar. PICA patent bilaterally. Basilar widely patent. Superior cerebellar and posterior cerebral arteries widely patent bilaterally.Left internal carotid artery widely patent. Left anterior and middle cerebral arteries widely patent. Azygos A2 segment.Right internal carotid artery widely patent. Right A1 segment patent. Common A2 segment. Right M1 segment patent. Moderate stenosis in the anterior division of the right M2 branch. No large vessel occlusion.  A/R: -- Images from MRI brain were personally reviewed. The right sided petechial hemorrhage appears more likely to convert to gross hemorrhage based on the MRI appearance, relative to the appearance on CT head obtained earlier, although there has been no gross change in the size of the region involved, nor of the degree of mass effect.   -- Switching Lovenox to SCDs -- Continue ASA. Benefits for secondary stroke prevention are felt to outweigh risks of worsened hemorrhagic transformation.   Electronically signed: Dr. Caryl Pina

## 2020-01-08 ENCOUNTER — Inpatient Hospital Stay (HOSPITAL_COMMUNITY): Payer: BC Managed Care – PPO

## 2020-01-08 ENCOUNTER — Inpatient Hospital Stay (HOSPITAL_COMMUNITY): Payer: Self-pay

## 2020-01-08 DIAGNOSIS — F1010 Alcohol abuse, uncomplicated: Secondary | ICD-10-CM

## 2020-01-08 DIAGNOSIS — G936 Cerebral edema: Secondary | ICD-10-CM

## 2020-01-08 DIAGNOSIS — I69391 Dysphagia following cerebral infarction: Secondary | ICD-10-CM | POA: Diagnosis not present

## 2020-01-08 DIAGNOSIS — I639 Cerebral infarction, unspecified: Secondary | ICD-10-CM

## 2020-01-08 DIAGNOSIS — F17200 Nicotine dependence, unspecified, uncomplicated: Secondary | ICD-10-CM | POA: Diagnosis not present

## 2020-01-08 DIAGNOSIS — R296 Repeated falls: Secondary | ICD-10-CM | POA: Diagnosis not present

## 2020-01-08 DIAGNOSIS — D72829 Elevated white blood cell count, unspecified: Secondary | ICD-10-CM | POA: Diagnosis not present

## 2020-01-08 DIAGNOSIS — I10 Essential (primary) hypertension: Secondary | ICD-10-CM | POA: Diagnosis not present

## 2020-01-08 DIAGNOSIS — F1220 Cannabis dependence, uncomplicated: Secondary | ICD-10-CM | POA: Diagnosis not present

## 2020-01-08 DIAGNOSIS — I6389 Other cerebral infarction: Secondary | ICD-10-CM

## 2020-01-08 DIAGNOSIS — G894 Chronic pain syndrome: Secondary | ICD-10-CM | POA: Diagnosis not present

## 2020-01-08 DIAGNOSIS — E876 Hypokalemia: Secondary | ICD-10-CM | POA: Diagnosis not present

## 2020-01-08 DIAGNOSIS — I63311 Cerebral infarction due to thrombosis of right middle cerebral artery: Secondary | ICD-10-CM | POA: Diagnosis not present

## 2020-01-08 DIAGNOSIS — I63411 Cerebral infarction due to embolism of right middle cerebral artery: Secondary | ICD-10-CM | POA: Diagnosis not present

## 2020-01-08 DIAGNOSIS — I63511 Cerebral infarction due to unspecified occlusion or stenosis of right middle cerebral artery: Secondary | ICD-10-CM | POA: Diagnosis not present

## 2020-01-08 LAB — LIPID PANEL
Cholesterol: 204 mg/dL — ABNORMAL HIGH (ref 0–200)
HDL: 47 mg/dL (ref >40)
LDL Cholesterol: 138 mg/dL — ABNORMAL HIGH (ref 0–99)
Total CHOL/HDL Ratio: 4.3 RATIO
Triglycerides: 94 mg/dL (ref <150)
VLDL: 19 mg/dL (ref 0–40)

## 2020-01-08 LAB — VITAMIN B12: Vitamin B-12: 362 pg/mL (ref 180–914)

## 2020-01-08 LAB — URINE CULTURE: Culture: NO GROWTH

## 2020-01-08 LAB — ECHOCARDIOGRAM COMPLETE
Height: 74 in
Weight: 3520 oz

## 2020-01-08 LAB — HEMOGLOBIN A1C
Hgb A1c MFr Bld: 5.6 % (ref 4.8–5.6)
Mean Plasma Glucose: 114.02 mg/dL

## 2020-01-08 LAB — RPR: RPR Ser Ql: NONREACTIVE

## 2020-01-08 MED ORDER — AMLODIPINE BESYLATE 10 MG PO TABS
10.0000 mg | ORAL_TABLET | Freq: Every day | ORAL | Status: DC
  Administered 2020-01-09 – 2020-01-13 (×5): 10 mg via ORAL
  Filled 2020-01-08 (×5): qty 1

## 2020-01-08 MED ORDER — RESOURCE THICKENUP CLEAR PO POWD
ORAL | Status: DC | PRN
  Filled 2020-01-08: qty 125

## 2020-01-08 MED ORDER — IOHEXOL 350 MG/ML SOLN
75.0000 mL | Freq: Once | INTRAVENOUS | Status: AC
  Administered 2020-01-08: 75 mL via INTRAVENOUS

## 2020-01-08 NOTE — Evaluation (Signed)
Physical Therapy Evaluation Patient Details Name: Derek [name] MRN: [id number] DOB: July 18, 1976 Today's Date: 01/08/2020   History of Present Illness  44 yo male no known medical history presenting as Code Stroke.  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 There is hemorrhagic transformation of the right basal ganglia infarct with mass-effect on the lateral ventricle."   Clinical Impression  Patient presents with decreased mobility due to decreased L side awareness, L hemiplegia, decreased balance, poor activity tolerance today with decreased arousal and c/o headache.  He required +2 A for EOB and OOB to chair.  Previously independent and living with his girlfriend.  Feel he will need CIR level rehab prior to d/c home with assistance. PT to follow acutely.     Follow Up Recommendations CIR;Supervision/Assistance - 24 hour    Equipment Recommendations  Rolling walker with 5" wheels;Wheelchair (measurements PT);Wheelchair cushion (measurements PT)(TBA)    Recommendations for Other Services Rehab consult     Precautions / Restrictions Precautions Precautions: Fall Precaution Comments: L neglect      Mobility  Bed Mobility Overal bed mobility: Needs Assistance Bed Mobility: Supine to Sit     Supine to sit: +2 for physical assistance;+2 for safety/equipment;Max assist     General bed mobility comments: hob elevated to help facilitation initiation of pushing into sitting  Transfers Overall transfer level: Needs assistance   Transfers: Sit to/from Stand;Stand Pivot Transfers Sit to Stand: +2 physical assistance;Mod assist Stand pivot transfers: +2 physical assistance;Max assist;From elevated surface       General transfer comment: pt does not initiate stepping with R LE. pt bares weight on L LE but lacks awareness and needs assist for knee and hip blocking  Ambulation/Gait             General Gait Details:  unable  Stairs            Wheelchair Mobility    Modified Rankin (Stroke Patients Only) Modified Rankin (Stroke Patients Only) Pre-Morbid Rankin Score: No symptoms Modified Rankin: Severe disability     Balance Overall balance assessment: Needs assistance Sitting-balance support: Single extremity supported;Feet supported Sitting balance-Leahy Scale: Poor Sitting balance - Comments: pushing L in sitting, cues to keep R hand in lap Postural control: Left lateral lean Standing balance support: Single extremity supported;During functional activity Standing balance-Leahy Scale: Zero Standing balance comment: max A pf 2 for standing                             Pertinent Vitals/Pain Pain Assessment: Faces Faces Pain Scale: Hurts even more Pain Location: head, neck,  Pain Descriptors / Indicators: Discomfort Pain Intervention(s): Monitored during session;Repositioned    Home Living Family/patient expects to be discharged to:: Inpatient rehab                      Prior Function Level of Independence: Independent         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[place]     Hand Dominance        Extremity/Trunk Assessment   Upper Extremity Assessment Upper Extremity Assessment: Defer to OT evaluation    Lower Extremity Assessment Lower Extremity Assessment: LLE deficits/detail LLE Deficits / Details: mildly decreased tone with PROM, no active movement noted, but supported some in standing with L LE    Cervical / Trunk Assessment Cervical / Trunk Assessment: Other exceptions Cervical / Trunk Exceptions: L lateral  lean  Communication   Communication: No difficulties(decreased arousal)  Cognition Arousal/Alertness: Lethargic Behavior During Therapy: Flat affect Overall Cognitive Status: Difficult to assess                                        General Comments General comments (skin integrity, edema, etc.): assisted to chair, RN  informed use of drop arm and transfer to L for back to bed wtih+2 A    Exercises     Assessment/Plan    PT Assessment Patient needs continued PT services  PT Problem List Decreased strength;Decreased activity tolerance;Decreased mobility;Decreased balance;Decreased cognition;Decreased safety awareness;Impaired sensation;Decreased knowledge of precautions       PT Treatment Interventions DME instruction;Therapeutic activities;Balance training;Cognitive remediation;Therapeutic exercise;Functional mobility training;Gait training;Patient/family education;Neuromuscular re-education    PT Goals (Current goals can be found in the Care Plan section)  Acute Rehab PT Goals Patient Stated Goal: make head stop hurting PT Goal Formulation: With patient Time For Goal Achievement: 01/22/20 Potential to Achieve Goals: Good    Frequency Min 4X/week   Barriers to discharge        Co-evaluation PT/OT/SLP Co-Evaluation/Treatment: Yes Reason for Co-Treatment: To address functional/ADL transfers;For patient/therapist safety;Necessary to address cognition/behavior during functional activity PT goals addressed during session: Mobility/safety with mobility;Balance         AM-PAC PT "6 Clicks" Mobility  Outcome Measure Help needed turning from your back to your side while in a flat bed without using bedrails?: A Lot Help needed moving from lying on your back to sitting on the side of a flat bed without using bedrails?: A Lot Help needed moving to and from a bed to a chair (including a wheelchair)?: Total Help needed standing up from a chair using your arms (e.g., wheelchair or bedside chair)?: Total Help needed to walk in hospital room?: Total Help needed climbing 3-5 steps with a railing? : Total 6 Click Score: 8    End of Session Equipment Utilized During Treatment: Gait belt Activity Tolerance: Patient limited by fatigue;Patient limited by lethargy Patient left: in chair;with call bell/phone  within reach;with chair alarm set Nurse Communication: Mobility status PT Visit Diagnosis: Other abnormalities of gait and mobility (R26.89);Other symptoms and signs involving the nervous system (R29.898);Hemiplegia and hemiparesis Hemiplegia - Right/Left: Left Hemiplegia - dominant/non-dominant: Non-dominant Hemiplegia - caused by: Cerebral infarction;Nontraumatic intracerebral hemorrhage    Time: 0850-0914 PT Time Calculation (min) (ACUTE ONLY): 24 min   Charges:   PT Evaluation $PT Eval Moderate Complexity: 1 Mod          Sheran Lawless, [facility] [phone] 01/08/2020   Elray Mcgregor 01/08/2020, 3:58 PM

## 2020-01-08 NOTE — Progress Notes (Signed)
Rehab Admissions Coordinator Note:  Per PT/OT/SLP recommendations patient was screened by Stephania Fragmin for appropriateness for an Inpatient Acute Rehab Consult.  At this time, we are recommending Inpatient Rehab consult.  I will place an order per our protocol.   Stephania Fragmin 01/08/2020, 4:59 PM  I can be reached at [phone].

## 2020-01-08 NOTE — Progress Notes (Signed)
  Echocardiogram 2D Echocardiogram has been performed.  [name] 01/08/2020, 12:03 PM

## 2020-01-08 NOTE — Evaluation (Signed)
Speech Language Pathology Evaluation Patient Details Name: Derek [name] MRN: [id number] DOB: 1976-08-19 Today's Date: 01/08/2020 Time: 6644-0347 SLP Time Calculation (min) (ACUTE ONLY): 13 min  Problem List:  Patient Active Problem List   Diagnosis Date Noted  . 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 Tobacco dependence   . Marijuana dependence (HCC)   . Alcohol dependence (HCC)    Past Medical History:  Past Medical History:  Diagnosis Date  . 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 Alcohol dependence (HCC)   . Back pain   . Marijuana dependence (HCC)   . Tobacco dependence    Past Surgical History: History reviewed. No pertinent surgical history. HPI:  Derek [name] is a 43 y.o. male with no known medical history presenting as Code Stroke.  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 There is hemorrhagic transformation of the right basal ganglia infarct with mass-effect on the lateral ventricle."   Assessment / Plan / Recommendation Clinical Impression  Pt was seen for a cognitive-linguistic evaluation in the setting of a R MCA territory infarct involving basal ganglia and right parietal lobe.  He was lethargic throughout this evaluation; however, he was able to participate well for an abbreviated amount of time given cues to maintain alertness.  Pt reported that he is fully independent with ADLs and IADs at baseline and that he works full time as a [employer].  He additionally stated that he lives with his fiance and young children.  Pt exhibited deficits in short-term memory and complex problem solving.  He additionally presented with dysarthria with speech intelligibility mildly reduced at the phrase, sentence, and conversation level.  Speech was approximately 90% intelligible to an unfamiliar listener.  Cues for slowed rate of speech and over-articulation were helpful in increasing speech intelligibility.   Expressive and receptive language were functional.  SLP will f/u for diagnostic treatment to further evalaute cognitive-linguistic abilities when pt is more alert.  Pt would benefit from additional ST and assistance with IADLs (medications, finances, etc.) at time of discharge.      SLP Assessment  SLP Recommendation/Assessment: Patient needs continued Speech Lanaguage Pathology Services SLP Visit Diagnosis: Dysarthria and anarthria (R47.1);Cognitive communication deficit (R41.841)    Follow Up Recommendations  Inpatient Rehab    Frequency and Duration min 2x/week  2 weeks      SLP Evaluation Cognition  Overall Cognitive Status: Difficult to assess Arousal/Alertness: Lethargic Orientation Level: Oriented X4 Attention: Sustained Sustained Attention: Appears intact Memory: Impaired Memory Impairment: Decreased short term memory Decreased Short Term Memory: Verbal basic Immediate Memory Recall: Sock;Blue;Bed Memory Recall Sock: Without Cue Memory Recall Blue: With Cue Memory Recall Bed: With Cue Awareness: Appears intact Problem Solving: Impaired Problem Solving Impairment: Verbal complex Safety/Judgment: Appears intact       Comprehension  Auditory Comprehension Overall Auditory Comprehension: Appears within functional limits for tasks assessed Yes/No Questions: Within Functional Limits Commands: Within Functional Limits Conversation: Complex Reading Comprehension Reading Status: Not tested    Expression Expression Primary Mode of Expression: Verbal Verbal Expression Overall Verbal Expression: Appears within functional limits for tasks assessed Initiation: No impairment Level of Generative/Spontaneous Verbalization: Conversation Repetition: No impairment Naming: No impairment   Oral / Motor  Oral Motor/Sensory Function Overall Oral Motor/Sensory Function: Mild impairment Facial ROM: Reduced left Facial Symmetry: Abnormal symmetry left Facial Sensation: Within  Functional Limits Lingual ROM: Within Functional Limits Lingual Strength: Reduced Motor Speech Overall Motor Speech: Impaired Respiration:  Within functional limits Phonation: Normal Resonance: Within functional limits Articulation: Impaired Level of Impairment: Phrase Intelligibility: Intelligibility reduced Word: 75-100% accurate Phrase: 75-100% accurate Sentence: 75-100% accurate Conversation: 75-100% accurate Motor Planning: Witnin functional limits Motor Speech Errors: Not applicable Effective Techniques: Slow rate;Over-articulate   GO                   [name]., M.S., [facility] Office: [phone]  [place] 01/08/2020, 11:12 AM

## 2020-01-08 NOTE — Evaluation (Signed)
Clinical/Bedside Swallow Evaluation Patient Details  Name: Derek [name] MRN: [id number] Date of Birth: 10-19-1976  Today's Date: 01/08/2020 Time: SLP Start Time (ACUTE ONLY): 0347 SLP Stop Time (ACUTE ONLY): 0956 SLP Time Calculation (min) (ACUTE ONLY): 13 min  Past Medical History:  Past Medical History:  Diagnosis Date  . Acute ischemic right MCA stroke ([facility]) 01/07/2020  . Alcohol dependence ([place])   . Back pain   . Marijuana dependence ([place])   . Tobacco dependence    Past Surgical History: History reviewed. No pertinent surgical history. HPI:   Derek [name] is a 44 y.o. male with no known medical history presenting as Code Stroke.  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 There is hemorrhagic transformation of the right basal ganglia infarct with mass-effect on the lateral ventricle."   Assessment / Plan / Recommendation Clinical Impression  Pt was seen for a bedside swallow evaluation and he presents with suspected oropharyngeal dysphagia.  Oral mechanism exam was remarkable for abnormal facial symmetry on the L and reduced labial ROM on the L.  Lingual and labial strength also appeared to be reduced, particularly on the L side.  Pt was lethargic throughout this evaluation and he required intermittent verbal and tactile stimulation to maintain alertness.  He attempted to hold the cup and spoon in his right hand in order to feed himself; however, he exhibited tremors and required assistance.  Pt was observed to have a decreased labial seal around the straw on the L side, but he was able to effectively draw liquid from the straw when it was positioned on the R side.  Oral transit was prolonged with all trials and mastication of regular solids was mild-moderately prolonged.  Pt exhibited an immediate cough following 1/5 trials of thin liquid and following 1/1 trials of regular solids.  No overt s/sx of aspiration were observed with trials of  nectar-thick liquid or puree.  Recommend an instrumental swallow study to further evalaute swallow function.  Pt is too lethargic to participate in an instrumental swallow study on this date; however, he was agreeable to try tomorrow.  Recommend diet change to Dysphagia 2 (ground) solids and nectar-thick liquids with medications administered crushed in puree.  Pt will require full supervision during meals to assist with self feeding and to cue for compensatory strategies (listed below).  Recommend that pt only be fed if he is fully AWAKE/ALERT.  SLP will f/u per POC.    SLP Visit Diagnosis: Dysphagia, unspecified (R13.10)    Aspiration Risk  Moderate aspiration risk    Diet Recommendation Dysphagia 2 (Fine chop);Nectar-thick liquid   Liquid Administration via: Cup;Straw Medication Administration: Crushed with puree Supervision: Full supervision/cueing for compensatory strategies;Staff to assist with self feeding Compensations: Minimize environmental distractions;Slow rate;Small sips/bites;Other (Comment)(Check for oral clearance ) Postural Changes: Seated upright at 90 degrees    Other  Recommendations Oral Care Recommendations: Oral care BID;Staff/trained caregiver to provide oral care Other Recommendations: Have oral suction available;Order thickener from pharmacy   Follow up Recommendations Inpatient Rehab      Frequency and Duration min 2x/week  2 weeks       Prognosis Prognosis for Safe Diet Advancement: Good      Swallow Study   General Date of Onset: 01/07/20 HPI:  Derek [name] is a 44 y.o. male with no known medical history presenting as Code Stroke.  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 There is  hemorrhagic transformation of the right basal ganglia Type of Study: Bedside Swallow Evaluation Previous Swallow Assessment: None  Diet Prior to this Study: Regular;Thin liquids Temperature Spikes Noted: Yes Respiratory  Status: Room air History of Recent Intubation: No Behavior/Cognition: Lethargic/Drowsy;Cooperative Oral Cavity Assessment: Within Functional Limits Oral Care Completed by SLP: No Oral Cavity - Dentition: Missing dentition Vision: Functional for self-feeding Self-Feeding Abilities: Needs assist Patient Positioning: Upright in bed Baseline Vocal Quality: Normal Volitional Cough: Strong Volitional Swallow: Able to elicit    Oral/Motor/Sensory Function Overall Oral Motor/Sensory Function: Mild impairment Facial ROM: Reduced left Facial Symmetry: Abnormal symmetry left Facial Sensation: Within Functional Limits Lingual ROM: Within Functional Limits Lingual Strength: Reduced   Ice Chips Ice chips: Not tested   Thin Liquid Thin Liquid: Impaired Presentation: Spoon;Straw Pharyngeal  Phase Impairments: Suspected delayed Swallow;Cough - Delayed;Cough - Immediate    Nectar Thick Nectar Thick Liquid: Within functional limits Presentation: Straw   Honey Thick Honey Thick Liquid: Not tested   Puree Puree: Impaired Presentation: Spoon Oral Phase Functional Implications: Prolonged oral transit   Solid     Solid: Impaired Presentation: Spoon Oral Phase Impairments: Impaired mastication Oral Phase Functional Implications: Impaired mastication;Prolonged oral transit Pharyngeal Phase Impairments: Cough - Delayed     Villa Herb M.S., CCC-SLP Acute Rehabilitation Services Office: [phone]  Shanon Rosser Vennie Salsbury 01/08/2020,10:40 AM

## 2020-01-08 NOTE — Progress Notes (Signed)
Fiance stated in call that she had the pt's cell phone and the sister has his wallet. RN tried to ask the Pt who he wanted to be his designated visitor and he went back to sleep.

## 2020-01-08 NOTE — Progress Notes (Signed)
PROGRESS NOTE    Derek [name]  WUJ:[id number] DOB: Feb 08, 1976 DOA: 01/07/2020 PCP: Aliene Beams, MD   Brief Narrative:  HPI On 01/07/2020 by Dr. Burnett Sheng [name] is a 44 y.o. male with no known medical history presenting as Code Stroke.  The patient is mostly sleeping and awakes and appears appropriate but his conversation doesn't exactly make sense.  He complains of being cold - when I asked him to straighten his right leg out (it was behind his left leg), he says he was "just putting it behind this thing to keep it warm" and seems unaware completely of his left side.  He reports that he needs to call his work so he doesn't get fired, but then didn't actually want to use the phone. He reports headache.  I called and spoke with his sister.  She was notified this AM that he was taken here.  She reports that he went out Saturday night to a family member's birthday party; they took him home and he was very intoxicated.  He was placed on the bed after 0200.  His girlfriend noted him on the floor sometime later.  She found him there again sometime later and had someone help him into the bed.  He was not moving on one side.  He apparently fell back on the floor at some point.  This AM, she finally called 911.  He has chronic back pain from a remote accident.  He has been under a lot of stress - including with his live-in girlfriend.  He also has a 7yo son.  Interim history Admitted for Acute CVA. Pending w/up Assessment & Plan   Acute CVA with cerebral edema -Patient was last seen normal at approximately 2 AM on Sunday morning, and has had recurrent falls -With significant left-sided deficits and cognitive impairment, as well as lethargy on examination -CT head: Recent M1 segment, right middle cerebral artery infarct with mass-effect and possible petechial hemorrhage in portions of the right basal ganglia.  Proximal extent of postero superior right frontal and adjacent right  parietal lobes. -CTA neck: neck no large vessel occlusion or significant stenosis -MRI head showed acute right MCA territory going basal ganglia and right parietal lobe.  Hemorrhagic transformation of the right basal ganglia infarct with mass-effect on the lateral ventricle -Echocardiogram showed EF of 60-65%, LV diastolic parameters normal. -Lower extremity Doppler showed no evidence of DVT -Carotid doppler: B/L 1-39% ICA stenosis.  Bilateral vertebral arteries demonstrate antegrade flow -LDL 138, hemoglobin A1c 5.6 -Neurology consulted and appreciated-recommends TEE to look for embolic source once patient is more stable.  Continue aspirin 3 and 25 mg daily for now.  Holding DAPT given his hemorrhagic transformation.  Planning to repeat CT head on 01/09/2020.  Consider 3% saline if needed  Essential hypertension -Placed on Norvasc -BP goal less than 160 given hemorrhagic conversion  Alcohol dependence -Continue CIWA protocol -No signs of withdrawal  Marijuana dependence -UDS positive for THC -Will need cessation discussion or more alert  Tobacco dependence -Continue nicotine patch, will need   SIRS -Patient with leukocytosis of 14.3, temperature of 100.5 F -UA unremarkable for infection, urine culture shows no growth -Blood cultures pending -Suspect secondary to the above   DVT Prophylaxis SCDs  Code Status: Full  Family Communication: None at bedside  Disposition Plan: Admitted from home.  Currently being worked up for acute CVA.  Will need more testing and possibly TEE later this week as per neurology.  Disposition TBD.  Consultants Neurology  Procedures  Echocardiogram Lower extremity Doppler Carotid Doppler  Antibiotics   Anti-infectives (From admission, onward)   Start     Dose/Rate Route Frequency Ordered Stop   01/07/20 2200  vancomycin (VANCOREADY) IVPB 1500 mg/300 mL  Status:  Discontinued     1,500 mg 150 mL/hr over 120 Minutes Intravenous Every 12 hours  01/07/20 0834 01/07/20 1332   01/07/20 1400  ceFEPIme (MAXIPIME) 2 g in sodium chloride 0.9 % 100 mL IVPB  Status:  Discontinued     2 g 200 mL/hr over 30 Minutes Intravenous Every 8 hours 01/07/20 0834 01/07/20 1332   01/07/20 0830  ceFEPIme (MAXIPIME) 2 g in sodium chloride 0.9 % 100 mL IVPB     2 g 200 mL/hr over 30 Minutes Intravenous  Once 01/07/20 0826 01/07/20 0932   01/07/20 0830  metroNIDAZOLE (FLAGYL) IVPB 500 mg     500 mg 100 mL/hr over 60 Minutes Intravenous  Once 01/07/20 0826 01/07/20 1041   01/07/20 0830  vancomycin (VANCOCIN) IVPB 1000 mg/200 mL premix  Status:  Discontinued     1,000 mg 200 mL/hr over 60 Minutes Intravenous  Once 01/07/20 0826 01/07/20 0828   01/07/20 0830  vancomycin (VANCOREADY) IVPB 2000 mg/400 mL     2,000 mg 200 mL/hr over 120 Minutes Intravenous  Once 01/07/20 0828 01/07/20 1135      Subjective:   Echo [place] seen and examined today.  Sleepy this morning.  Does complain of headache.  Has no other complaints. Objective:   Vitals:   01/07/20 2336 01/08/20 0300 01/08/20 0850 01/08/20 1122  BP: (!) 150/90 (!) 147/115  (!) 145/95  Pulse: 77  77 78  Resp: (!) 23  20 16   Temp: 98.6 F (37 C) 98.6 F (37 C) 99.7 F (37.6 C) 99.6 F (37.6 C)  TempSrc: Axillary Axillary Oral Oral  SpO2: 96%  96% 100%  Weight:      Height:        Intake/Output Summary (Last 24 hours) at 01/08/2020 1426 Last data filed at 01/08/2020 0851 Gross per 24 hour  Intake --  Output 1350 ml  Net -1350 ml   Filed Weights   01/07/20 0659  Weight: 99.8 kg    Exam  General: Well developed, well nourished, NAD, appears stated age  HEENT: NCAT, mucous membranes moist.   Cardiovascular: S1 S2 auscultated, no rubs, murmurs or gallops. Regular rate and rhythm.  Respiratory: Clear to auscultation bilaterally with equal chest rise  Abdomen: Soft, nontender, nondistended, + bowel sounds  Extremities: warm dry without cyanosis clubbing or edema  Neuro: AAOx3,  cannot fully assess this patient continues to fall asleep during examination.  Appears to have left sided weakness   Data Reviewed: I have personally reviewed following labs and imaging studies  CBC: Recent Labs  Lab 01/07/20 0703  WBC 14.3*  NEUTROABS 12.6*  HGB 14.8  HCT 44.7  MCV 88.9  PLT 314   Basic Metabolic Panel: Recent Labs  Lab 01/07/20 0703 01/07/20 1644  NA 140  --   K 4.0  --   CL 102  --   CO2 23  --   GLUCOSE 134*  --   BUN 8  --   CREATININE 1.19  --   CALCIUM 9.6  --   MG  --  2.1  PHOS  --  3.3   GFR: Estimated Creatinine Clearance: 99.9 mL/min (by C-G formula based on SCr of 1.19 mg/dL). Liver Function Tests: Recent Labs  Lab 01/07/20 0703  AST 43*  ALT 31  ALKPHOS 85  BILITOT 0.8  PROT 7.5  ALBUMIN 4.2   No results for input(s): LIPASE, AMYLASE in the last 168 hours. No results for input(s): AMMONIA in the last 168 hours. Coagulation Profile: Recent Labs  Lab 01/07/20 0703  INR 1.0   Cardiac Enzymes: Recent Labs  Lab 01/07/20 0703  CKTOTAL 787*   BNP (last 3 results) No results for input(s): PROBNP in the last 8760 hours. HbA1C: Recent Labs    01/08/20 0450  HGBA1C 5.6   CBG: No results for input(s): GLUCAP in the last 168 hours. Lipid Profile: Recent Labs    01/08/20 0450  CHOL 204*  HDL 47  LDLCALC 138*  TRIG 94  CHOLHDL 4.3   Thyroid Function Tests: Recent Labs    01/07/20 1644  TSH 0.501   Anemia Panel: Recent Labs    01/08/20 1028  VITAMINB12 362   Urine analysis:    Component Value Date/Time   COLORURINE YELLOW 01/07/2020 1104   APPEARANCEUR CLEAR 01/07/2020 1104   LABSPEC 1.028 01/07/2020 1104   PHURINE 6.0 01/07/2020 1104   GLUCOSEU NEGATIVE 01/07/2020 1104   HGBUR SMALL (A) 01/07/2020 1104   BILIRUBINUR NEGATIVE 01/07/2020 1104   KETONESUR 5 (A) 01/07/2020 1104   PROTEINUR 100 (A) 01/07/2020 1104   NITRITE NEGATIVE 01/07/2020 1104   LEUKOCYTESUR NEGATIVE 01/07/2020 1104   Sepsis  Labs: @LABRCNTIP (procalcitonin:4,lacticidven:4)  ) Recent Results (from the past 240 hour(s))  Blood culture (routine x 2)     Status: None (Preliminary result)   Collection Time: 01/07/20  8:07 AM   Specimen: BLOOD RIGHT ARM  Result Value Ref Range Status   Specimen Description BLOOD RIGHT ARM  Final   Special Requests   Final    BOTTLES DRAWN AEROBIC AND ANAEROBIC Blood Culture adequate volume   Culture   Final    NO GROWTH 1 DAY Performed at [facility], [address] [place], [place] [address]    Report Status PENDING  Incomplete  SARS CORONAVIRUS 2 (TAT 6-24 HRS) Nasopharyngeal Nasopharyngeal Swab     Status: None   Collection Time: 01/07/20  9:34 AM   Specimen: Nasopharyngeal Swab  Result Value Ref Range Status   SARS Coronavirus 2 NEGATIVE NEGATIVE Final    Comment: (NOTE) SARS-CoV-2 target nucleic acids are NOT DETECTED. The SARS-CoV-2 RNA is generally detectable in upper and lower respiratory specimens during the acute phase of infection. Negative results do not preclude SARS-CoV-2 infection, do not rule out co-infections with other pathogens, and should not be used as the sole basis for treatment or other patient management decisions. Negative results must be combined with clinical observations, patient history, and epidemiological information. The expected result is Negative. Fact Sheet for Patients: HairSlick.nohttps://www.fda.gov/media/138098/download Fact Sheet for Healthcare Providers: quierodirigir.comhttps://www.fda.gov/media/138095/download This test is not yet approved or cleared by the [place] FDA and  has been authorized for detection and/or diagnosis of SARS-CoV-2 by FDA under an Emergency Use Authorization (EUA). This EUA will remain  in effect (meaning this test can be used) for the duration of the COVID-19 declaration under Section 56 4(b)(1) of the Act, 21 U.S.C. section 360bbb-3(b)(1), unless the authorization is terminated or revoked sooner. Performed at [facility], [address] [place], [place] [address]   Urine culture     Status: None   Collection Time: 01/07/20 10:47 AM   Specimen: In/Out Cath Urine  Result Value Ref Range Status  Specimen Description IN/OUT CATH URINE  Final   Special Requests NONE  Final   Culture   Final    NO GROWTH Performed at [facility], [place] [address], [place], [place] [address]    Report Status 01/08/2020 FINAL  Final      Radiology Studies: CT Head Wo Contrast  Result Date: 01/07/2020 CLINICAL DATA:  Pt came in [facility] from home with c/o of stroke symptoms. Pt was LSN at 0200 on Saturday. Pt has L-Sided Deficits, L-Sided Facial Droop, R-Sided Gaze. EMS states patient has alcohol on Friday and had a fall on Saturday and one again this morning. Pt is complaining of Head Pain. Pt had an episode of Incontinence. Pt present with untreated HTN. EXAM: CT HEAD WITHOUT CONTRAST TECHNIQUE: Contiguous axial images were obtained from the base of the skull through the vertex without intravenous contrast. COMPARISON:  10/29/2006 FINDINGS: Brain: There is patchy hypoattenuation involving the right basal ganglia including the external and internal capsules with hypoattenuation noted in a more patchy distribution extending superiorly and laterally into the posterior right frontal and adjacent right parietal lobes. Within the area of hypoattenuation, there is relative increased attenuation in portions of the caudate nucleus head and globus pallidus and putamen, which may reflect areas of petechial hemorrhage. There is associated mass effect partial effacement of the anterior horn and body of the right lateral ventricle, and slight midline shift to the left of 2-3 mm. No other areas of abnormal attenuation to suggest additional areas of infarct. No discrete masses. No evidence of hemorrhage other than the possible petechial hemorrhage in the right basal ganglia. No hydrocephalus. Vascular: Focus of increased attenuation  noted right middle cerebral artery is suspicious for thrombus/embolus. Skull: Normal. Negative for fracture or focal lesion. Sinuses/Orbits: Globes and orbits are unremarkable. Small amount of dependent fluid in the left sphenoid sinus. Other: None. IMPRESSION: 1. Recent M1 segment, right middle cerebral artery infarct, with mass effect and possible petechial hemorrhage in portions of the right basal ganglia. Infarct extends to the posterosuperior right frontal and adjacent right parietal lobes. Suspect thrombus/embolus in the M1 segment of right middle cerebral artery. 2. No other acute or recent intracranial abnormality. Electronically Signed   By: Lajean Manes M.D.   On: 01/07/2020 07:40   CT ANGIO NECK W OR WO CONTRAST  Result Date: 01/08/2020 CLINICAL DATA:  Right MCA stroke EXAM: CT ANGIOGRAPHY NECK TECHNIQUE: Multidetector CT imaging of the neck was performed using the standard protocol during bolus administration of intravenous contrast. Multiplanar CT image reconstructions and MIPs were obtained to evaluate the vascular anatomy. Carotid stenosis measurements (when applicable) are obtained utilizing NASCET criteria, using the distal internal carotid diameter as the denominator. CONTRAST:  75 mL OMNIPAQUE IOHEXOL 350 MG/ML SOLN COMPARISON:  None. FINDINGS: Aortic arch: Great vessel origins are patent. Right carotid system: Patent. Minimal calcified plaque at the ICA origin. There is no measurable stenosis or evidence of dissection. Left carotid system: Patent. There is no measurable stenosis or evidence of dissection. Vertebral arteries: Patent. No measurable stenosis or evidence of dissection. Skeleton: Mild degenerative changes, greatest at C6-C7 and C5-C6. Other neck: No mass or adenopathy. Upper chest: No apical lung mass. IMPRESSION: No large vessel occlusion or hemodynamically significant stenosis. Electronically Signed   By: Macy Mis M.D.   On: 01/08/2020 10:14   MR ANGIO HEAD WO  CONTRAST  Result Date: 01/07/2020 CLINICAL DATA:  Stroke. Last seen normal 0200 hours on Saturday. Left-sided deficits. EXAM: MRI HEAD  WITHOUT CONTRAST MRA HEAD WITHOUT CONTRAST TECHNIQUE: Multiplanar, multiecho pulse sequences of the brain and surrounding structures were obtained without intravenous contrast. Angiographic images of the head were obtained using MRA technique without contrast. COMPARISON:  CT head 01/07/2020 FINDINGS: MRI HEAD FINDINGS Brain: Acute infarct right posterior MCA territory involving the right parietal cortex extending over the medial convexity. Patchy acute infarct also in the right basal ganglia with moderate amount of associated hemorrhage. This hemorrhage is mildly hyperdense on CT most likely hemorrhagic transformation of basal ganglia infarct on the right. There is local mass-effect on the right lateral ventricle with minimal midline shift to the left. No hydrocephalus. No mass lesion. Vascular: Normal arterial flow voids. Skull and upper cervical spine: No focal skull lesion. Sinuses/Orbits: Mucosal edema paranasal sinuses with air-fluid level in the sphenoid sinus. Negative orbit Other: None MRA HEAD FINDINGS Both vertebral arteries widely patent to the basilar. PICA patent bilaterally. Basilar widely patent. Superior cerebellar and posterior cerebral arteries widely patent bilaterally. Left internal carotid artery widely patent. Left anterior and middle cerebral arteries widely patent. Azygos A2 segment. Right internal carotid artery widely patent. Right A1 segment patent. Common A2 segment. Right M1 segment patent. Moderate stenosis in the anterior division of the right M2 branch. No large vessel occlusion. IMPRESSION: Acute right MCA territory involving basal ganglia and right parietal lobe. There is hemorrhagic transformation of the right basal ganglia infarct with mass-effect on the lateral ventricle. Right M1 segment widely patent. Moderate stenosis right M2 segment  anteriorly. Presumably the thrombus has recanalized given the symptoms are greater than 48 hours old. Electronically Signed   By: Marlan Palau M.D.   On: 01/07/2020 15:22   MR BRAIN WO CONTRAST  Result Date: 01/07/2020 CLINICAL DATA:  Stroke. Last seen normal 0200 hours on Saturday. Left-sided deficits. EXAM: MRI HEAD WITHOUT CONTRAST MRA HEAD WITHOUT CONTRAST TECHNIQUE: Multiplanar, multiecho pulse sequences of the brain and surrounding structures were obtained without intravenous contrast. Angiographic images of the head were obtained using MRA technique without contrast. COMPARISON:  CT head 01/07/2020 FINDINGS: MRI HEAD FINDINGS Brain: Acute infarct right posterior MCA territory involving the right parietal cortex extending over the medial convexity. Patchy acute infarct also in the right basal ganglia with moderate amount of associated hemorrhage. This hemorrhage is mildly hyperdense on CT most likely hemorrhagic transformation of basal ganglia infarct on the right. There is local mass-effect on the right lateral ventricle with minimal midline shift to the left. No hydrocephalus. No mass lesion. Vascular: Normal arterial flow voids. Skull and upper cervical spine: No focal skull lesion. Sinuses/Orbits: Mucosal edema paranasal sinuses with air-fluid level in the sphenoid sinus. Negative orbit Other: None MRA HEAD FINDINGS Both vertebral arteries widely patent to the basilar. PICA patent bilaterally. Basilar widely patent. Superior cerebellar and posterior cerebral arteries widely patent bilaterally. Left internal carotid artery widely patent. Left anterior and middle cerebral arteries widely patent. Azygos A2 segment. Right internal carotid artery widely patent. Right A1 segment patent. Common A2 segment. Right M1 segment patent. Moderate stenosis in the anterior division of the right M2 branch. No large vessel occlusion. IMPRESSION: Acute right MCA territory involving basal ganglia and right parietal lobe.  There is hemorrhagic transformation of the right basal ganglia infarct with mass-effect on the lateral ventricle. Right M1 segment widely patent. Moderate stenosis right M2 segment anteriorly. Presumably the thrombus has recanalized given the symptoms are greater than 48 hours old. Electronically Signed   By: Marlan Palau M.D.   On: 01/07/2020 15:22  DG Chest Port 1 View  Result Date: 01/07/2020 CLINICAL DATA:  Rule out infection.  Stroke symptoms EXAM: PORTABLE CHEST 1 VIEW COMPARISON:  12/07/2010 FINDINGS: Normal heart size and mediastinal contours. There is no edema, consolidation, effusion, or pneumothorax. Artifact from EKG leads. IMPRESSION: No evidence of active disease. Electronically Signed   By: Marnee Spring M.D.   On: 01/07/2020 07:44   VAS US CAROTID (at [facility] and [facility] only)  Result Date: 01/07/2020 Carotid Arterial Duplex Study Indications:       CVA. Risk Factors:      Prior CVA. Comparison Study:  no prior Performing Technologist: Blanch Media RVS  Examination Guidelines: A complete evaluation includes B-mode imaging, spectral Doppler, color Doppler, and power Doppler as needed of all accessible portions of each vessel. Bilateral testing is considered an integral part of a complete examination. Limited examinations for reoccurring indications may be performed as noted.  Right Carotid Findings: +----------+--------+--------+--------+------------------+--------+           PSV cm/sEDV cm/sStenosisPlaque DescriptionComments +----------+--------+--------+--------+------------------+--------+ CCA Prox  75      7               heterogenous               +----------+--------+--------+--------+------------------+--------+ CCA Distal57      13              heterogenous               +----------+--------+--------+--------+------------------+--------+ ICA Prox  85      36      1-39%   heterogenous      tortuous +----------+--------+--------+--------+------------------+--------+  ICA Distal132     60                                tortuous +----------+--------+--------+--------+------------------+--------+ ECA       81      14                                         +----------+--------+--------+--------+------------------+--------+ +----------+--------+-------+--------+-------------------+           PSV cm/sEDV cmsDescribeArm Pressure (mmHG) +----------+--------+-------+--------+-------------------+ [id number]      17                                 +----------+--------+-------+--------+-------------------+ +---------+--------+--+--------+--+---------+ VertebralPSV cm/s60EDV cm/s24Antegrade +---------+--------+--+--------+--+---------+  Left Carotid Findings: +----------+--------+--------+--------+------------------+--------+           PSV cm/sEDV cm/sStenosisPlaque DescriptionComments +----------+--------+--------+--------+------------------+--------+ CCA Prox  77      17              heterogenous               +----------+--------+--------+--------+------------------+--------+ CCA Distal71      18              heterogenous               +----------+--------+--------+--------+------------------+--------+ ICA Prox  59      21      1-39%   heterogenous               +----------+--------+--------+--------+------------------+--------+ ICA Distal66      24                                         +----------+--------+--------+--------+------------------+--------+  ECA       51      10                                         +----------+--------+--------+--------+------------------+--------+ +----------+--------+--------+--------+-------------------+           PSV cm/sEDV cm/sDescribeArm Pressure (mmHG) +----------+--------+--------+--------+-------------------+ [id number]                                          +----------+--------+--------+--------+-------------------+  +---------+--------+--+--------+--+---------+ VertebralPSV cm/s73EDV cm/s13Antegrade +---------+--------+--+--------+--+---------+   Summary: Right Carotid: Velocities in the right ICA are consistent with a 1-39% stenosis. Left Carotid: Velocities in the left ICA are consistent with a 1-39% stenosis. Vertebrals: Bilateral vertebral arteries demonstrate antegrade flow. *See table(s) above for measurements and observations.     Preliminary    VAS [place] LOWER EXTREMITY VENOUS (DVT)  Result Date: 01/08/2020  Lower Venous DVTStudy Indications: Stroke.  Comparison Study: No prior study Performing Technologist: Gertie Fey MHA, RDMS, RVT, RDCS  Examination Guidelines: A complete evaluation includes B-mode imaging, spectral Doppler, color Doppler, and power Doppler as needed of all accessible portions of each vessel. Bilateral testing is considered an integral part of a complete examination. Limited examinations for reoccurring indications may be performed as noted. The reflux portion of the exam is performed with the patient in reverse Trendelenburg.  +---------+---------------+---------+-----------+----------+--------------+ RIGHT    CompressibilityPhasicitySpontaneityPropertiesThrombus Aging +---------+---------------+---------+-----------+----------+--------------+ CFV      Full           Yes      Yes                                 +---------+---------------+---------+-----------+----------+--------------+ SFJ      Full                                                        +---------+---------------+---------+-----------+----------+--------------+ FV Prox  Full                                                        +---------+---------------+---------+-----------+----------+--------------+ FV Mid   Full                                                        +---------+---------------+---------+-----------+----------+--------------+ FV DistalFull                                                         +---------+---------------+---------+-----------+----------+--------------+ PFV      Full                                                        +---------+---------------+---------+-----------+----------+--------------+  POP      Full           Yes      Yes                                 +---------+---------------+---------+-----------+----------+--------------+ PTV      Full                                                        +---------+---------------+---------+-----------+----------+--------------+ PERO     Full                                                        +---------+---------------+---------+-----------+----------+--------------+   +---------+---------------+---------+-----------+----------+--------------+ LEFT     CompressibilityPhasicitySpontaneityPropertiesThrombus Aging +---------+---------------+---------+-----------+----------+--------------+ CFV      Full           Yes      Yes                                 +---------+---------------+---------+-----------+----------+--------------+ SFJ      Full                                                        +---------+---------------+---------+-----------+----------+--------------+ FV Prox  Full                                                        +---------+---------------+---------+-----------+----------+--------------+ FV Mid   Full                                                        +---------+---------------+---------+-----------+----------+--------------+ FV DistalFull                                                        +---------+---------------+---------+-----------+----------+--------------+ PFV      Full                                                        +---------+---------------+---------+-----------+----------+--------------+ POP      Full           Yes      Yes                                  +---------+---------------+---------+-----------+----------+--------------+  PTV      Full                                                        +---------+---------------+---------+-----------+----------+--------------+ PERO     Full                                                        +---------+---------------+---------+-----------+----------+--------------+     Summary: RIGHT: - There is no evidence of deep vein thrombosis in the lower extremity.  - No cystic structure found in the popliteal fossa.  LEFT: - There is no evidence of deep vein thrombosis in the lower extremity.  - No cystic structure found in the popliteal fossa.  *See table(s) above for measurements and observations.    Preliminary      Scheduled Meds: . amLODipine  10 mg Oral Daily  . aspirin  300 mg Rectal Daily   Or  . aspirin  325 mg Oral Daily  . atorvastatin  40 mg Oral q1800  . feeding supplement (ENSURE ENLIVE)  237 mL Oral BID BM  . folic acid  1 mg Oral Daily  . labetalol  20 mg Intravenous Once  . metoprolol tartrate  5 mg Intravenous Q12H  . multivitamin with minerals  1 tablet Oral Daily  . nicotine  7 mg Transdermal Daily  . thiamine  100 mg Oral Daily   Or  . thiamine  100 mg Intravenous Daily   Continuous Infusions: . sodium chloride 50 mL/hr at 01/07/20 1840     LOS: 1 day   Time Spent in minutes   45 minutes  Abdoulie Tierce D.O. on 01/08/2020 at 2:26 PM  Between 7am to 7pm - Please see pager noted on [email or web address]  After 7pm go to [email or web address]  And look for the night coverage person covering for me after hours  [facility] Office  [phone]

## 2020-01-08 NOTE — Progress Notes (Signed)
RN attempted a swallow eval. But the Pt was unable to stay awake.

## 2020-01-08 NOTE — Progress Notes (Signed)
Initial Nutrition Assessment  **RD working remotely**  DOCUMENTATION CODES:   Not applicable  INTERVENTION:  Continue Ensure Enlive BID.   Continue MVI daily.  NUTRITION DIAGNOSIS:   Predicted suboptimal nutrient intake related to decreased appetite as evidenced by other (comment)(consult s/p stroke).    GOAL:   Patient will meet greater than or equal to 90% of their needs   MONITOR:   PO intake, Supplement acceptance, Labs, Weight trends  REASON FOR ASSESSMENT:   Consult Other (Comment)(stroke)  ASSESSMENT:   Pt with no known medical history admitted with R MCA CVA.  RD unable to reach pt via phone.   No PO intake documented.  Per RN, pt unable to stay awake for swallow evaluation this morning. Pt will be placed on Heart Healthy diet once able to pass swallow evaluation. Pt currently has orders for Ensure Enlive BID.   Medications reviewed and include: Folvite, MVI, Thiamine  Labs reviewed.     NUTRITION - FOCUSED PHYSICAL EXAM:  RD unable to perform at this time.    Diet Order:   Diet Order            Diet Heart Fluid consistency: Thin  Diet effective ____              EDUCATION NEEDS:   No education needs have been identified at this time  Skin:  Skin Assessment: Reviewed RN Assessment  Last BM:  01/05/20  Height:   Ht Readings from Last 1 Encounters:  01/07/20 6\' 2"  (1.88 m)    Weight:   Wt Readings from Last 2 Encounters:  01/07/20 99.8 kg  11/23/17 82.6 kg    BMI:  Body mass index is 28.25 kg/m.  Estimated Nutritional Needs:   Kcal:  2400-2600  Protein:  120-130 grams  Fluid:  >2L/d   11/25/17, MS, RD, LDN RD pager number and weekend/on-call pager number located in Amion.

## 2020-01-08 NOTE — Progress Notes (Signed)
Bilateral lower extremity venous duplex completed. Refer to "CV Proc" under chart review to view preliminary results.  01/08/2020 2:04 PM Eula Fried., MHA, RVT, RDCS, RDMS

## 2020-01-08 NOTE — Progress Notes (Signed)
Family Update  Spoke with patients sister Rosaura Carpenter discussed POC and status. Sister is LNOK and patients aunt Phyillis Judithann Sheen will be the visitor for this admission.

## 2020-01-08 NOTE — Progress Notes (Signed)
STROKE TEAM PROGRESS NOTE   INTERVAL HISTORY Pt sleepy drowsy but arousable with repetitive stimulation. Once arouse, he was able to answer all orientation questions right and then fall back to sleep. Still has left facial droop and left hemiplegia.   Vitals:   01/07/20 2251 01/07/20 2336 01/08/20 0300 01/08/20 0850  BP: (!) 170/82 (!) 150/90 (!) 147/115   Pulse: 78 77  77  Resp: (!) 26 (!) 23  20  Temp:  98.6 F (37 C) 98.6 F (37 C) 99.7 F (37.6 C)  TempSrc:  Axillary Axillary Oral  SpO2: 97% 96%  96%  Weight:      Height:        CBC:  Recent Labs  Lab 01/07/20 0703  WBC 14.3*  NEUTROABS 12.6*  HGB 14.8  HCT 44.7  MCV 88.9  PLT 423    Basic Metabolic Panel:  Recent Labs  Lab 01/07/20 0703 01/07/20 1644  NA 140  --   K 4.0  --   CL 102  --   CO2 23  --   GLUCOSE 134*  --   BUN 8  --   CREATININE 1.19  --   CALCIUM 9.6  --   MG  --  2.1  PHOS  --  3.3   Lipid Panel:     Component Value Date/Time   CHOL 204 (H) 01/08/2020 0450   TRIG 94 01/08/2020 0450   HDL 47 01/08/2020 0450   CHOLHDL 4.3 01/08/2020 0450   VLDL 19 01/08/2020 0450   LDLCALC 138 (H) 01/08/2020 0450   HgbA1c:  Lab Results  Component Value Date   HGBA1C 5.6 01/08/2020   Urine Drug Screen:     Component Value Date/Time   LABOPIA NONE DETECTED 01/07/2020 1434   COCAINSCRNUR NONE DETECTED 01/07/2020 1434   LABBENZ NONE DETECTED 01/07/2020 1434   AMPHETMU NONE DETECTED 01/07/2020 1434   THCU POSITIVE (A) 01/07/2020 1434   LABBARB NONE DETECTED 01/07/2020 1434    Alcohol Level No results found for: ETH  IMAGING past 24 hours MR ANGIO HEAD WO CONTRAST  Result Date: 01/07/2020 CLINICAL DATA:  Stroke. Last seen normal 0200 hours on Saturday. Left-sided deficits. EXAM: MRI HEAD WITHOUT CONTRAST MRA HEAD WITHOUT CONTRAST TECHNIQUE: Multiplanar, multiecho pulse sequences of the brain and surrounding structures were obtained without intravenous contrast. Angiographic images of the head  were obtained using MRA technique without contrast. COMPARISON:  CT head 01/07/2020 FINDINGS: MRI HEAD FINDINGS Brain: Acute infarct right posterior MCA territory involving the right parietal cortex extending over the medial convexity. Patchy acute infarct also in the right basal ganglia with moderate amount of associated hemorrhage. This hemorrhage is mildly hyperdense on CT most likely hemorrhagic transformation of basal ganglia infarct on the right. There is local mass-effect on the right lateral ventricle with minimal midline shift to the left. No hydrocephalus. No mass lesion. Vascular: Normal arterial flow voids. Skull and upper cervical spine: No focal skull lesion. Sinuses/Orbits: Mucosal edema paranasal sinuses with air-fluid level in the sphenoid sinus. Negative orbit Other: None MRA HEAD FINDINGS Both vertebral arteries widely patent to the basilar. PICA patent bilaterally. Basilar widely patent. Superior cerebellar and posterior cerebral arteries widely patent bilaterally. Left internal carotid artery widely patent. Left anterior and middle cerebral arteries widely patent. Azygos A2 segment. Right internal carotid artery widely patent. Right A1 segment patent. Common A2 segment. Right M1 segment patent. Moderate stenosis in the anterior division of the right M2 branch. No large vessel occlusion. IMPRESSION: Acute  right MCA territory involving basal ganglia and right parietal lobe. There is hemorrhagic transformation of the right basal ganglia infarct with mass-effect on the lateral ventricle. Right M1 segment widely patent. Moderate stenosis right M2 segment anteriorly. Presumably the thrombus has recanalized given the symptoms are greater than 48 hours old. Electronically Signed   By: Marlan Palau M.D.   On: 01/07/2020 15:22   MR BRAIN WO CONTRAST  Result Date: 01/07/2020 CLINICAL DATA:  Stroke. Last seen normal 0200 hours on Saturday. Left-sided deficits. EXAM: MRI HEAD WITHOUT CONTRAST MRA HEAD  WITHOUT CONTRAST TECHNIQUE: Multiplanar, multiecho pulse sequences of the brain and surrounding structures were obtained without intravenous contrast. Angiographic images of the head were obtained using MRA technique without contrast. COMPARISON:  CT head 01/07/2020 FINDINGS: MRI HEAD FINDINGS Brain: Acute infarct right posterior MCA territory involving the right parietal cortex extending over the medial convexity. Patchy acute infarct also in the right basal ganglia with moderate amount of associated hemorrhage. This hemorrhage is mildly hyperdense on CT most likely hemorrhagic transformation of basal ganglia infarct on the right. There is local mass-effect on the right lateral ventricle with minimal midline shift to the left. No hydrocephalus. No mass lesion. Vascular: Normal arterial flow voids. Skull and upper cervical spine: No focal skull lesion. Sinuses/Orbits: Mucosal edema paranasal sinuses with air-fluid level in the sphenoid sinus. Negative orbit Other: None MRA HEAD FINDINGS Both vertebral arteries widely patent to the basilar. PICA patent bilaterally. Basilar widely patent. Superior cerebellar and posterior cerebral arteries widely patent bilaterally. Left internal carotid artery widely patent. Left anterior and middle cerebral arteries widely patent. Azygos A2 segment. Right internal carotid artery widely patent. Right A1 segment patent. Common A2 segment. Right M1 segment patent. Moderate stenosis in the anterior division of the right M2 branch. No large vessel occlusion. IMPRESSION: Acute right MCA territory involving basal ganglia and right parietal lobe. There is hemorrhagic transformation of the right basal ganglia infarct with mass-effect on the lateral ventricle. Right M1 segment widely patent. Moderate stenosis right M2 segment anteriorly. Presumably the thrombus has recanalized given the symptoms are greater than 48 hours old. Electronically Signed   By: Marlan Palau M.D.   On: 01/07/2020  15:22   VAS US CAROTID (at [facility] and [facility] only)  Result Date: 01/07/2020 Carotid Arterial Duplex Study Indications:       CVA. Risk Factors:      Prior CVA. Comparison Study:  no prior Performing Technologist: Blanch Media RVS  Examination Guidelines: A complete evaluation includes B-mode imaging, spectral Doppler, color Doppler, and power Doppler as needed of all accessible portions of each vessel. Bilateral testing is considered an integral part of a complete examination. Limited examinations for reoccurring indications may be performed as noted.  Right Carotid Findings: +----------+--------+--------+--------+------------------+--------+           PSV cm/sEDV cm/sStenosisPlaque DescriptionComments +----------+--------+--------+--------+------------------+--------+ CCA Prox  75      7               heterogenous               +----------+--------+--------+--------+------------------+--------+ CCA Distal57      13              heterogenous               +----------+--------+--------+--------+------------------+--------+ ICA Prox  85      36      1-39%   heterogenous      tortuous +----------+--------+--------+--------+------------------+--------+ ICA Distal132     60  tortuous +----------+--------+--------+--------+------------------+--------+ ECA       81      14                                         +----------+--------+--------+--------+------------------+--------+ +----------+--------+-------+--------+-------------------+           PSV cm/sEDV cmsDescribeArm Pressure (mmHG) +----------+--------+-------+--------+-------------------+ [id number]      17                                 +----------+--------+-------+--------+-------------------+ +---------+--------+--+--------+--+---------+ VertebralPSV cm/s60EDV cm/s24Antegrade +---------+--------+--+--------+--+---------+  Left Carotid Findings:  +----------+--------+--------+--------+------------------+--------+           PSV cm/sEDV cm/sStenosisPlaque DescriptionComments +----------+--------+--------+--------+------------------+--------+ CCA Prox  77      17              heterogenous               +----------+--------+--------+--------+------------------+--------+ CCA Distal71      18              heterogenous               +----------+--------+--------+--------+------------------+--------+ ICA Prox  59      21      1-39%   heterogenous               +----------+--------+--------+--------+------------------+--------+ ICA Distal66      24                                         +----------+--------+--------+--------+------------------+--------+ ECA       51      10                                         +----------+--------+--------+--------+------------------+--------+ +----------+--------+--------+--------+-------------------+           PSV cm/sEDV cm/sDescribeArm Pressure (mmHG) +----------+--------+--------+--------+-------------------+ [id number]                                          +----------+--------+--------+--------+-------------------+ +---------+--------+--+--------+--+---------+ VertebralPSV cm/s73EDV cm/s13Antegrade +---------+--------+--+--------+--+---------+   Summary: Right Carotid: Velocities in the right ICA are consistent with a 1-39% stenosis. Left Carotid: Velocities in the left ICA are consistent with a 1-39% stenosis. Vertebrals: Bilateral vertebral arteries demonstrate antegrade flow. *See table(s) above for measurements and observations.     Preliminary     PHYSICAL EXAM  Temp:  [98.4 F (36.9 C)-99.7 F (37.6 C)] 99.6 F (37.6 C) (03/09 1122) Pulse Rate:  [70-86] 77 (03/09 0850) Resp:  [20-27] 20 (03/09 0850) BP: (134-189)/(74-121) 147/115 (03/09 0300) SpO2:  [96 %-100 %] 96 % (03/09 0850)  General - Well nourished, well developed, in no apparent  distress, sleepy and drowsy.  Ophthalmologic - fundi not visualized due to noncooperation.  Cardiovascular - Regular rhythm and rate.  Neuro - sleepy and drowsy, needed repetitive stimulation to keep arouse. During brief arousal period, he was able to answer orientation questions, orientated to time place, age and situation. PERRL, EOMI. Visual field full. Significant left facial droop, tongue protrusion to the left. RUE and RLE strong at least 4/5, LUE 0/5  and LLE 1/5 with pain. DTR diminished on the left and negative babinski. Sensation, coordination and gait not tested.    ASSESSMENT/PLAN Mr. KENNEY GOING is a 44 y.o. male with history of tobacco, marijuana and alcohol use with limited healthcare presenting following frequent falls over the weekend with L sided hemiparesis, R gaze deviation, HA and incontinence.   Stroke:   R MCA infarct w/ hemorrhagic transformation, infarct embolic pattern, secondary to unknown source  CT head recent R MCA M1 infarct w/ mass effect and petechial hemorrhage in R basal ganglia. Infarct extends to R frontal and R parietal lobes. Likely thrombus R M1.  MRI  R MCA infarct R basal ganglia, R parietal lobe. Hemorrhagic transformation in R basal ganglia w/ mass effect on lateral ventricle.   MRA  R M1 patent. Moderate R M2 stenosis.  CTA neck unremarkable  2D Echo pending  LE doppler pending  TEE to look for embolic source once more stable.    Hypercoagulable labs pending   LDL 138  HgbA1c 5.6  Lovenox changed to SCDs for VTE prophylaxis during the night d/t hemorrhagic transformation  No antithrombotic prior to admission, now on aspirin 325 mg daily. Hold DAPT at this time given hemorrhagic transformation   Therapy recommendations:  pending   Disposition:  pending   Cerebral edema  CT showed right MCA infarct with mass effect  MRI right MCA infarct with mass effect on lateral ventricle with minimal MLS  Pt drowsy sleepy  Repeat CT  in am pending  Consider 3% saline if needed  Hypertension  BP elevated up to 189/106 . BP goal < 160 given hemorrhagic conversion . Added norvasc 10 . Long-term BP goal normotensive  Hyperlipidemia  Home meds:  No statin  Now on lipitor 40  LDL 138, goal < 70  Continue statin at discharge  Fever and leukocytosis  Leukocytosis 14.3  TM 100.5   UCx no growth  UA neg  B Cx pending  Tobacco abuse  Current smoker  Smoking cessation counseling will be provided  Nicotine patch provided  Alcohol abuse  On CIWA protocol  MVI/B1/FA  Seizure precaution  Other Stroke Risk Factors  Substance abuse - Marijuana dependence. UDS - THC POSITIVE, cessation education will be provided  Overweight, Body mass index is 28.25 kg/m., recommend weight loss, diet and exercise as appropriate   Other Active Problems    Hospital day # 1  I spent  35 minutes in total face-to-face time with the patient, more than 50% of which was spent in counseling and coordination of care, reviewing test results, images and medication, and discussing the diagnosis of stroke, MCA occlusion, cerebral edema, smoker and alcohol abuse, treatment plan and potential prognosis. This patient's care requiresreview of multiple databases, neurological assessment, discussion with family, other specialists and medical decision making of high complexity. I also discussed with Dr. Catha Gosselin.   Marvel Plan, MD PhD Stroke Neurology 01/08/2020 12:26 PM    To contact Stroke Continuity provider, please refer to [email or web address]. After hours, contact General Neurology

## 2020-01-08 NOTE — Evaluation (Signed)
Occupational Therapy Evaluation Patient Details Name: Derek [name] MRN: [id number] DOB: September 04, 1976 Today's Date: 01/08/2020    History of Present Illness 44 yo male no known medical history presenting as Code Stroke.  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 There is hemorrhagic transformation of the right basal ganglia infarct with mass-effect on the lateral ventricle."    Clinical Impression   PT admitted with R MCA CVA. Pt currently with functional limitiations due to the deficits listed below (see OT problem list). Pt currently with no activation of L UE and weight bearing on LLE without awareness. Pt will benefit from skilled OT to increase their independence and safety with adls and balance to allow discharge CIR.     Follow Up Recommendations  CIR    Equipment Recommendations  3 in 1 bedside commode    Recommendations for Other Services Rehab consult     Precautions / Restrictions Precautions Precautions: Fall Restrictions Weight Bearing Restrictions: No      Mobility Bed Mobility Overal bed mobility: Needs Assistance Bed Mobility: Supine to Sit     Supine to sit: +2 for physical assistance;+2 for safety/equipment;Max assist     General bed mobility comments: hob elevated to help facilitation initiation of pushing into sitting  Transfers Overall transfer level: Needs assistance   Transfers: Sit to/from Stand;Stand Pivot Transfers Sit to Stand: +2 physical assistance;Mod assist Stand pivot transfers: +2 physical assistance;Max assist;From elevated surface       General transfer comment: pt does not initiate stepping with R LE. pt bares weight on L LE but lacks awareness     Balance Overall balance assessment: Needs assistance Sitting-balance support: Single extremity supported;Feet supported Sitting balance-Leahy Scale: Poor Sitting balance - Comments: pushign L   Standing balance support: Single  extremity supported;During functional activity Standing balance-Leahy Scale: Zero                             ADL either performed or assessed with clinical judgement   ADL Overall ADL's : Needs assistance/impaired Eating/Feeding: NPO   Grooming: Wash/dry hands;Minimal assistance;Bed level Grooming Details (indicate cue type and reason): pt terminates task prematurely. pt leaving wash cloth over eyes. pt states "okay" when asked to remove. Pt hears phone ring and spontaneously pulls wrap off face Upper Body Bathing: Maximal assistance   Lower Body Bathing: Total assistance   Upper Body Dressing : Maximal assistance   Lower Body Dressing: Total assistance   Toilet Transfer: +2 for physical assistance;Maximal assistance;+2 for safety/equipment             General ADL Comments: pt with L lateral lean and lacks awarness to L deficits     Vision   Vision Assessment?: Vision impaired- to be further tested in functional context Additional Comments: pt keeps eyes closed entire session. difficult to assess     Perception     Praxis      Pertinent Vitals/Pain Pain Assessment: Faces Faces Pain Scale: Hurts even more Pain Location: head, neck,  Pain Descriptors / Indicators: Discomfort Pain Intervention(s): Monitored during session;Repositioned     Hand Dominance Right   Extremity/Trunk Assessment Upper Extremity Assessment Upper Extremity Assessment: LUE deficits/detail LUE Deficits / Details: no active movement noted   Lower Extremity Assessment Lower Extremity Assessment: Defer to PT evaluation   Cervical / Trunk Assessment Cervical / Trunk Assessment: Other exceptions Cervical / Trunk Exceptions: L lateral lean  Communication Communication Communication: No difficulties(decreased arousal so need to further assess)   Cognition Arousal/Alertness: Lethargic Behavior During Therapy: Flat affect Overall Cognitive Status: Difficult to assess                                      General Comments       Exercises     Shoulder Instructions      Home Living                                      Lives With: Significant other;Other (Comment)(children )    Prior Functioning/Environment Level of Independence: Independent        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[place]        OT Problem List: Decreased strength;Decreased activity tolerance;Impaired balance (sitting and/or standing);Decreased safety awareness;Decreased cognition;Decreased coordination;Decreased knowledge of use of DME or AE;Decreased knowledge of precautions;Cardiopulmonary status limiting activity;Obesity;Impaired UE functional use      OT Treatment/Interventions: Self-care/ADL training;Therapeutic exercise;Neuromuscular education;Energy conservation;DME and/or AE instruction;Manual therapy;Modalities;Splinting;Therapeutic activities;Cognitive remediation/compensation;Visual/perceptual remediation/compensation;Patient/family education;Balance training    OT Goals(Current goals can be found in the care plan section) Acute Rehab OT Goals Patient Stated Goal: make head stop hurting OT Goal Formulation: Patient unable to participate in goal setting Time For Goal Achievement: 01/22/20 Potential to Achieve Goals: Good  OT Frequency: Min 3X/week   Barriers to D/C: Decreased caregiver support          Co-evaluation PT/OT/SLP Co-Evaluation/Treatment: Yes Reason for Co-Treatment: For patient/therapist safety;To address functional/ADL transfers   OT goals addressed during session: ADL's and self-care;Strengthening/ROM      AM-PAC OT "6 Clicks" Daily Activity     Outcome Measure Help from another person eating meals?: A Little Help from another person taking care of personal grooming?: A Little Help from another person toileting, which includes using toliet, bedpan, or urinal?: Total Help from another person bathing (including washing, rinsing,  drying)?: Total Help from another person to put on and taking off regular upper body clothing?: A Lot Help from another person to put on and taking off regular lower body clothing?: Total 6 Click Score: 11   End of Session Equipment Utilized During Treatment: Gait belt Nurse Communication: Mobility status;Precautions  Activity Tolerance: Patient tolerated treatment well Patient left: in chair;with call bell/phone within reach;with chair alarm set  OT Visit Diagnosis: Unsteadiness on feet (R26.81)                Time: 4503-8882 OT Time Calculation (min): 24 min Charges:  OT General Charges $OT Visit: 1 Visit OT Evaluation $OT Eval Moderate Complexity: 1 Mod   Brynn, OTR/L  Acute Rehabilitation Services Pager: [phone] Office: [phone] .   Mateo Flow 01/08/2020, 12:38 PM

## 2020-01-09 ENCOUNTER — Inpatient Hospital Stay (HOSPITAL_COMMUNITY): Payer: BC Managed Care – PPO

## 2020-01-09 DIAGNOSIS — I10 Essential (primary) hypertension: Secondary | ICD-10-CM | POA: Diagnosis not present

## 2020-01-09 DIAGNOSIS — Z8679 Personal history of other diseases of the circulatory system: Secondary | ICD-10-CM | POA: Diagnosis not present

## 2020-01-09 DIAGNOSIS — M25512 Pain in left shoulder: Secondary | ICD-10-CM | POA: Diagnosis not present

## 2020-01-09 DIAGNOSIS — I69154 Hemiplegia and hemiparesis following nontraumatic intracerebral hemorrhage affecting left non-dominant side: Secondary | ICD-10-CM | POA: Diagnosis not present

## 2020-01-09 DIAGNOSIS — E876 Hypokalemia: Secondary | ICD-10-CM | POA: Diagnosis not present

## 2020-01-09 DIAGNOSIS — I63411 Cerebral infarction due to embolism of right middle cerebral artery: Secondary | ICD-10-CM

## 2020-01-09 DIAGNOSIS — R29810 Facial weakness: Secondary | ICD-10-CM | POA: Diagnosis not present

## 2020-01-09 DIAGNOSIS — K219 Gastro-esophageal reflux disease without esophagitis: Secondary | ICD-10-CM | POA: Diagnosis not present

## 2020-01-09 DIAGNOSIS — F1220 Cannabis dependence, uncomplicated: Secondary | ICD-10-CM | POA: Diagnosis not present

## 2020-01-09 DIAGNOSIS — I69192 Facial weakness following nontraumatic intracerebral hemorrhage: Secondary | ICD-10-CM | POA: Diagnosis not present

## 2020-01-09 DIAGNOSIS — Z7141 Alcohol abuse counseling and surveillance of alcoholic: Secondary | ICD-10-CM | POA: Diagnosis not present

## 2020-01-09 DIAGNOSIS — I69391 Dysphagia following cerebral infarction: Secondary | ICD-10-CM | POA: Diagnosis not present

## 2020-01-09 DIAGNOSIS — Z791 Long term (current) use of non-steroidal anti-inflammatories (NSAID): Secondary | ICD-10-CM | POA: Diagnosis not present

## 2020-01-09 DIAGNOSIS — R296 Repeated falls: Secondary | ICD-10-CM | POA: Diagnosis not present

## 2020-01-09 DIAGNOSIS — F17210 Nicotine dependence, cigarettes, uncomplicated: Secondary | ICD-10-CM | POA: Diagnosis not present

## 2020-01-09 DIAGNOSIS — G936 Cerebral edema: Secondary | ICD-10-CM | POA: Diagnosis not present

## 2020-01-09 DIAGNOSIS — R1310 Dysphagia, unspecified: Secondary | ICD-10-CM | POA: Diagnosis present

## 2020-01-09 DIAGNOSIS — E872 Acidosis: Secondary | ICD-10-CM | POA: Diagnosis not present

## 2020-01-09 DIAGNOSIS — E785 Hyperlipidemia, unspecified: Secondary | ICD-10-CM | POA: Diagnosis not present

## 2020-01-09 DIAGNOSIS — R202 Paresthesia of skin: Secondary | ICD-10-CM | POA: Diagnosis not present

## 2020-01-09 DIAGNOSIS — I639 Cerebral infarction, unspecified: Secondary | ICD-10-CM | POA: Diagnosis present

## 2020-01-09 DIAGNOSIS — F1020 Alcohol dependence, uncomplicated: Secondary | ICD-10-CM | POA: Diagnosis not present

## 2020-01-09 DIAGNOSIS — G894 Chronic pain syndrome: Secondary | ICD-10-CM

## 2020-01-09 DIAGNOSIS — R52 Pain, unspecified: Secondary | ICD-10-CM | POA: Diagnosis not present

## 2020-01-09 DIAGNOSIS — I63511 Cerebral infarction due to unspecified occlusion or stenosis of right middle cerebral artery: Secondary | ICD-10-CM

## 2020-01-09 DIAGNOSIS — D72829 Elevated white blood cell count, unspecified: Secondary | ICD-10-CM | POA: Diagnosis not present

## 2020-01-09 DIAGNOSIS — M549 Dorsalgia, unspecified: Secondary | ICD-10-CM | POA: Diagnosis not present

## 2020-01-09 DIAGNOSIS — Z79899 Other long term (current) drug therapy: Secondary | ICD-10-CM | POA: Diagnosis not present

## 2020-01-09 DIAGNOSIS — R6510 Systemic inflammatory response syndrome (SIRS) of non-infectious origin without acute organ dysfunction: Secondary | ICD-10-CM | POA: Diagnosis not present

## 2020-01-09 DIAGNOSIS — Z20822 Contact with and (suspected) exposure to covid-19: Secondary | ICD-10-CM | POA: Diagnosis not present

## 2020-01-09 DIAGNOSIS — Z7151 Drug abuse counseling and surveillance of drug abuser: Secondary | ICD-10-CM | POA: Diagnosis not present

## 2020-01-09 DIAGNOSIS — I63311 Cerebral infarction due to thrombosis of right middle cerebral artery: Principal | ICD-10-CM

## 2020-01-09 DIAGNOSIS — E663 Overweight: Secondary | ICD-10-CM | POA: Diagnosis present

## 2020-01-09 DIAGNOSIS — F17200 Nicotine dependence, unspecified, uncomplicated: Secondary | ICD-10-CM

## 2020-01-09 DIAGNOSIS — G8110 Spastic hemiplegia affecting unspecified side: Secondary | ICD-10-CM | POA: Diagnosis not present

## 2020-01-09 DIAGNOSIS — G8929 Other chronic pain: Secondary | ICD-10-CM | POA: Diagnosis not present

## 2020-01-09 DIAGNOSIS — I619 Nontraumatic intracerebral hemorrhage, unspecified: Secondary | ICD-10-CM | POA: Diagnosis not present

## 2020-01-09 DIAGNOSIS — R1312 Dysphagia, oropharyngeal phase: Secondary | ICD-10-CM | POA: Diagnosis not present

## 2020-01-09 DIAGNOSIS — K5900 Constipation, unspecified: Secondary | ICD-10-CM | POA: Diagnosis not present

## 2020-01-09 DIAGNOSIS — H5500 Unspecified nystagmus: Secondary | ICD-10-CM | POA: Diagnosis not present

## 2020-01-09 DIAGNOSIS — Z7982 Long term (current) use of aspirin: Secondary | ICD-10-CM | POA: Diagnosis not present

## 2020-01-09 DIAGNOSIS — R252 Cramp and spasm: Secondary | ICD-10-CM | POA: Diagnosis not present

## 2020-01-09 DIAGNOSIS — Z716 Tobacco abuse counseling: Secondary | ICD-10-CM | POA: Diagnosis not present

## 2020-01-09 DIAGNOSIS — G8194 Hemiplegia, unspecified affecting left nondominant side: Secondary | ICD-10-CM | POA: Diagnosis not present

## 2020-01-09 DIAGNOSIS — I69191 Dysphagia following nontraumatic intracerebral hemorrhage: Secondary | ICD-10-CM | POA: Diagnosis not present

## 2020-01-09 DIAGNOSIS — Z6828 Body mass index (BMI) 28.0-28.9, adult: Secondary | ICD-10-CM | POA: Diagnosis not present

## 2020-01-09 LAB — CBC
HCT: 42.4 % (ref 39.0–52.0)
Hemoglobin: 14.2 g/dL (ref 13.0–17.0)
MCH: 29.5 pg (ref 26.0–34.0)
MCHC: 33.5 g/dL (ref 30.0–36.0)
MCV: 88.1 fL (ref 80.0–100.0)
Platelets: 256 10*3/uL (ref 150–400)
RBC: 4.81 MIL/uL (ref 4.22–5.81)
RDW: 17.5 % — ABNORMAL HIGH (ref 11.5–15.5)
WBC: 9.2 10*3/uL (ref 4.0–10.5)
nRBC: 0 % (ref 0.0–0.2)

## 2020-01-09 LAB — LUPUS ANTICOAGULANT PANEL
DRVVT: 36.7 s (ref 0.0–47.0)
PTT Lupus Anticoagulant: 32.7 s (ref 0.0–51.9)

## 2020-01-09 LAB — BASIC METABOLIC PANEL
Anion gap: 9 (ref 5–15)
BUN: 9 mg/dL (ref 6–20)
CO2: 24 mmol/L (ref 22–32)
Calcium: 8.9 mg/dL (ref 8.9–10.3)
Chloride: 107 mmol/L (ref 98–111)
Creatinine, Ser: 0.88 mg/dL (ref 0.61–1.24)
GFR calc Af Amer: >60 mL/min (ref >60)
GFR calc non Af Amer: >60 mL/min (ref >60)
Glucose, Bld: 98 mg/dL (ref 70–99)
Potassium: 3.4 mmol/L — ABNORMAL LOW (ref 3.5–5.1)
Sodium: 140 mmol/L (ref 135–145)

## 2020-01-09 LAB — CARDIOLIPIN ANTIBODIES, IGG, IGM, IGA
Anticardiolipin IgA: <9 APL U/mL (ref 0–11)
Anticardiolipin IgG: <9 GPL U/mL (ref 0–14)
Anticardiolipin IgM: <9 MPL U/mL (ref 0–12)

## 2020-01-09 LAB — BETA-2-GLYCOPROTEIN I ABS, IGG/M/A
Beta-2 Glyco I IgG: <9 GPI IgG units (ref 0–20)
Beta-2-Glycoprotein I IgA: <9 GPI IgA units (ref 0–25)
Beta-2-Glycoprotein I IgM: <9 GPI IgM units (ref 0–32)

## 2020-01-09 LAB — ANTINUCLEAR ANTIBODIES, IFA: ANA Ab, IFA: NEGATIVE

## 2020-01-09 LAB — HOMOCYSTEINE: Homocysteine: 13.9 umol/L (ref 0.0–14.5)

## 2020-01-09 MED ORDER — POTASSIUM CHLORIDE CRYS ER 20 MEQ PO TBCR
40.0000 meq | EXTENDED_RELEASE_TABLET | Freq: Two times a day (BID) | ORAL | Status: AC
  Administered 2020-01-09 – 2020-01-10 (×2): 40 meq via ORAL
  Filled 2020-01-09 (×2): qty 2

## 2020-01-09 MED ORDER — OXYCODONE HCL 5 MG PO TABS
5.0000 mg | ORAL_TABLET | Freq: Four times a day (QID) | ORAL | Status: DC | PRN
  Administered 2020-01-09 – 2020-01-11 (×3): 5 mg via ORAL
  Filled 2020-01-09 (×3): qty 1

## 2020-01-09 NOTE — Progress Notes (Signed)
    [facility] has been requested to perform a transesophageal echocardiogram w/ MAC on [employer] for stroke.  After careful review of history and examination, the risks and benefits of transesophageal echocardiogram have been explained including risks of esophageal damage, perforation (1:10,000 risk), bleeding, pharyngeal hematoma as well as other potential complications associated with conscious sedation including aspiration, arrhythmia, respiratory failure and death. Alternatives to treatment were discussed, questions were answered. Patient is willing to proceed.   Murray Hodgkins, NP  01/09/2020 8:06 PM

## 2020-01-09 NOTE — Progress Notes (Signed)
Patient off unit to CT scan.

## 2020-01-09 NOTE — Progress Notes (Signed)
STROKE TEAM PROGRESS NOTE   INTERVAL HISTORY Patient sitting in chair, awake alert, orientated.  Mental status much improved from yesterday.  Admitted that he drinks beers 3-4 every day, and 1 pack/day smoking.  He denies any heart palpitation.  Still has left hemiplegia and facial droop.  Plan for TEE tomorrow.  Vitals:   01/09/20 0450 01/09/20 0803 01/09/20 0939 01/09/20 1141  BP: 128/80 131/86 (!) 141/83 (!) 135/94  Pulse: 62 (!) 57  60  Resp: (!) 21 18  18   Temp: 98.2 F (36.8 C) 99.5 F (37.5 C)  98.6 F (37 C)  TempSrc: Axillary Oral  Oral  SpO2: 100% 98%  97%  Weight:      Height:        CBC:  Recent Labs  Lab 01/07/20 0703 01/09/20 0429  WBC 14.3* 9.2  NEUTROABS 12.6*  --   HGB 14.8 14.2  HCT 44.7 42.4  MCV 88.9 88.1  PLT 314 256    Basic Metabolic Panel:  Recent Labs  Lab 01/07/20 0703 01/07/20 1644 01/09/20 0429  NA 140  --  140  K 4.0  --  3.4*  CL 102  --  107  CO2 23  --  24  GLUCOSE 134*  --  98  BUN 8  --  9  CREATININE 1.19  --  0.88  CALCIUM 9.6  --  8.9  MG  --  2.1  --   PHOS  --  3.3  --    Lipid Panel:     Component Value Date/Time   CHOL 204 (H) 01/08/2020 0450   TRIG 94 01/08/2020 0450   HDL 47 01/08/2020 0450   CHOLHDL 4.3 01/08/2020 0450   VLDL 19 01/08/2020 0450   LDLCALC 138 (H) 01/08/2020 0450   HgbA1c:  Lab Results  Component Value Date   HGBA1C 5.6 01/08/2020   Urine Drug Screen:     Component Value Date/Time   LABOPIA NONE DETECTED 01/07/2020 1434   COCAINSCRNUR NONE DETECTED 01/07/2020 1434   LABBENZ NONE DETECTED 01/07/2020 1434   AMPHETMU NONE DETECTED 01/07/2020 1434   THCU POSITIVE (A) 01/07/2020 1434   LABBARB NONE DETECTED 01/07/2020 1434    Alcohol Level No results found for: ETH  IMAGING past 24 hours CT HEAD WO CONTRAST  Result Date: 01/09/2020 CLINICAL DATA:  Follow-up examination for acute stroke. EXAM: CT HEAD WITHOUT CONTRAST TECHNIQUE: Contiguous axial images were obtained from the base of  the skull through the vertex without intravenous contrast. COMPARISON:  Prior MRI from 01/07/2020. FINDINGS: Brain: Evolving cytotoxic edema involving the right frontal lobe is seen, with involvement of primarily the right MCA distribution, although possible involvement of right ACA territory present as well. Overall, distribution similar to previous MRI. Evidence for hemorrhagic transformation with blood products seen at the right caudate and lentiform nuclei, also similar. Associated regional mass effect with up to 3 mm of right-to-left shift. Right lateral ventricle partially effaced. No hydrocephalus or ventricular trapping. Basilar cisterns remain patent. No new large vessel territory infarct or intracranial hemorrhage. No extra-axial fluid collection. No mass lesion. Vascular: No hyperdense vessel. Skull: Scalp soft tissues and calvarium demonstrate no new finding. Sinuses/Orbits: Globes and orbital soft tissues demonstrate no acute finding. Left greater than right sphenoid sinus disease. Paranasal sinuses are otherwise largely clear. Trace left mastoid effusion noted. Other: None. IMPRESSION: 1. Evolving right cerebral infarct with evidence for hemorrhagic transformation at the right basal ganglia, similar in appearance and distribution as compared to previous  MRI. Associated mass effect with 3 mm of right-to-left shift. No hydrocephalus or ventricular trapping. 2. No other new acute intracranial abnormality. Electronically Signed   By: Derek [name] M.D.   On: 01/09/2020 03:18    PHYSICAL EXAM  Temp:  [97.6 F (36.4 C)-99.5 F (37.5 C)] 98.6 F (37 C) (03/10 1141) Pulse Rate:  [50-72] 60 (03/10 1141) Resp:  [16-21] 18 (03/10 1141) BP: (126-150)/(80-94) 135/94 (03/10 1141) SpO2:  [96 %-100 %] 97 % (03/10 1141)  General - Well nourished, well developed, in no apparent distress.  Ophthalmologic - fundi not visualized due to noncooperation.  Cardiovascular - Regular rhythm and  rate.  Neuro - awake alert and orientated x 4. PERRL, EOMI. Visual field full. Significant left facial droop, tongue protrusion to the left. RUE and RLE strong at least 4/5, LUE 0/5 and LLE 1/5 with pain. DTR diminished on the left and negative babinski. Sensation symmetrical, coordination intact right finger-to-nose and gait not able to be tested.   ASSESSMENT/PLAN Derek [name] is a 44 y.o. male with history of tobacco, marijuana and alcohol use with limited healthcare presenting following frequent falls over the weekend with L sided hemiparesis, R gaze deviation, HA and incontinence.   Stroke:   R MCA infarct w/ hemorrhagic transformation, infarct embolic pattern, secondary to unknown source  CT head recent R MCA M1 infarct w/ mass effect and petechial hemorrhage in R basal ganglia. Infarct extends to R frontal and R parietal lobes. Likely thrombus R M1.  MRI  R MCA infarct R basal ganglia, R parietal lobe. Hemorrhagic transformation in R basal ganglia w/ mass effect on lateral ventricle.   MRA  R M1 patent. Moderate R M2 stenosis.  CTA neck unremarkable  CT repeated 3/10 evolving right MCA infarct with hemorrhagic transformation, 3 mm right-to-left shift  2D Echo EF 60-65%. No source of embolus   LE doppler no DVT  TEE Thursday at [facility]    Hypercoagulable labs neg so far   LDL 138  HgbA1c 5.6  Lovenox changed to SCDs for VTE prophylaxis during the night d/t hemorrhagic transformation  No antithrombotic prior to admission, now on aspirin 325 mg daily. Hold DAPT at this time given hemorrhagic transformation   Therapy recommendations:  CIR  Disposition:  pending   Cerebral edema  CT showed right MCA infarct with mass effect  MRI right MCA infarct with mass effect on lateral ventricle with minimal MLS  Pt drowsy sleepy  Repeat CT in am evolving right MCA infarct with hemorrhagic conversion, 3 mm MLS  No 3% saline needed at this time  Hypertension  BP  elevated up to 189/106 . BP goal < 160 given hemorrhagic conversion . Added norvasc 10 . BP stable 130-140s . Long-term BP goal normotensive  Hyperlipidemia  Home meds:  No statin  Now on lipitor 40  LDL 138, goal < 70  Continue statin at discharge  SIRS - Fever and leukocytosis, resolved  Leukocytosis 14.3->9.2  TM 100.5 -> afebrile  UCx no growth  UA neg  B Cx no growth to date   Dysphagia . Secondary to stroke . On D2 nectar thick liquids . For MBSS following TEE completion . Speech on board  Tobacco abuse  Current smoker  Smoking cessation counseling will be provided  Nicotine patch provided  Alcohol abuse  On CIWA protocol  MVI/B1/FA  Seizure precaution  Other Stroke Risk Factors  Substance abuse - Marijuana dependence. UDS - THC POSITIVE, cessation education will be  provided  Overweight, Body mass index is 28.25 kg/m., recommend weight loss, diet and exercise as appropriate   Other Active Problems  Hypokalemia  Headache  Hospital day # 2   Marvel Plan, MD PhD Stroke Neurology 01/09/2020 2:51 PM    To contact Stroke Continuity provider, please refer to [email or web address]. After hours, contact General Neurology

## 2020-01-09 NOTE — Progress Notes (Addendum)
Physical Therapy Treatment Patient Details Name: Derek [name] MRN: [id number] DOB: 1976-05-24 Today's Date: 01/09/2020    History of Present Illness 44 yo male no known medical history presenting as Code Stroke.  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 There is hemorrhagic transformation of the right basal ganglia infarct with mass-effect on the lateral ventricle."     PT Comments    Pt making steady progress towards physical therapy goals. Pt with improving cognition and emerging awareness of deficits. Requiring decreased level of assist with bed mobility. Standing to [place] with excellent initiation, however, still demonstrates heavy left lateral lean in sitting and standing requiring heavy assist to promote upright. Highly recommend CIR to promote neuro recovery and suspect good progress considering age, PLOF and motivation.    Follow Up Recommendations  CIR;Supervision/Assistance - 24 hour     Equipment Recommendations  Wheelchair (measurements PT);Wheelchair cushion (measurements PT)    Recommendations for Other Services       Precautions / Restrictions Precautions Precautions: Fall Precaution Comments: L neglect Restrictions Weight Bearing Restrictions: No    Mobility  Bed Mobility Overal bed mobility: Needs Assistance Bed Mobility: Supine to Sit     Supine to sit: +2 for safety/equipment;Min assist     General bed mobility comments: MinA to bring LLE over to edge of bed, PT guarding LUE, pt pulling trunk up to sitting with RUE with bed rail  Transfers Overall transfer level: Needs assistance Equipment used: Ambulation equipment used Transfers: Sit to/from Stand Sit to Stand: Min assist;+2 safety/equipment;+2 physical assistance         General transfer comment: MinA +2 to pull up to sitting with RUE, good initiation and hip extension activation  Ambulation/Gait                 Stairs              Wheelchair Mobility    Modified Rankin (Stroke Patients Only) Modified Rankin (Stroke Patients Only) Pre-Morbid Rankin Score: No symptoms Modified Rankin: Severe disability     Balance Overall balance assessment: Needs assistance Sitting-balance support: Single extremity supported;Feet supported Sitting balance-Leahy Scale: Poor Sitting balance - Comments: pushing L in sitting, cues to keep R hand in lap   Standing balance support: Single extremity supported;During functional activity Standing balance-Leahy Scale: Zero Standing balance comment: maxA for standing in [place] with heavy left lateral lean                            Cognition Arousal/Alertness: Lethargic Behavior During Therapy: Flat affect Overall Cognitive Status: Impaired/Different from baseline Area of Impairment: Awareness;Safety/judgement                         Safety/Judgement: Decreased awareness of safety;Decreased awareness of deficits Awareness: Emergent   General Comments: Pt A&Ox4 today, still mildly drowsy, but able to follow all 1 step commands. Emerging awareness of deficits, asking, "how did this happen to someone my age?"       Exercises      General Comments        Pertinent Vitals/Pain Pain Assessment: Faces Faces Pain Scale: No hurt    Home Living                      Prior Function            PT Goals (current goals  can now be found in the care plan section) Acute Rehab PT Goals Potential to Achieve Goals: Good Progress towards PT goals: Progressing toward goals    Frequency    Min 4X/week      PT Plan Current plan remains appropriate    Co-evaluation              AM-PAC PT "6 Clicks" Mobility   Outcome Measure  Help needed turning from your back to your side while in a flat bed without using bedrails?: A Little Help needed moving from lying on your back to sitting on the side of a flat bed without using bedrails?: A  Little Help needed moving to and from a bed to a chair (including a wheelchair)?: A Lot Help needed standing up from a chair using your arms (e.g., wheelchair or bedside chair)?: A Lot Help needed to walk in hospital room?: Total Help needed climbing 3-5 steps with a railing? : Total 6 Click Score: 12    End of Session Equipment Utilized During Treatment: Gait belt Activity Tolerance: Patient tolerated treatment well Patient left: in chair;with call bell/phone within reach;with chair alarm set Nurse Communication: Mobility status PT Visit Diagnosis: Other abnormalities of gait and mobility (R26.89);Other symptoms and signs involving the nervous system (R29.898);Hemiplegia and hemiparesis Hemiplegia - Right/Left: Left Hemiplegia - dominant/non-dominant: Non-dominant Hemiplegia - caused by: Cerebral infarction;Nontraumatic intracerebral hemorrhage     Time: 9702-6378 PT Time Calculation (min) (ACUTE ONLY): 30 min  Charges:  $Therapeutic Activity: 23-37 mins                       Lillia Pauls, PT, DPT Acute Rehabilitation Services Pager [phone] Office [phone]    Derek [name] 01/09/2020, 5:16 PM

## 2020-01-09 NOTE — Progress Notes (Signed)
PROGRESS NOTE    Derek [name]  QQV:[id number] DOB: [birth year] DOA: 01/07/2020 PCP: [name], MD   Brief Narrative:  HPI per Dr. [name] on 01/07/20 Derek [name] is a 44 y.o. male with no known medical history presenting as Code Stroke.  The patient is mostly sleeping and awakes and appears appropriate but his conversation doesn't exactly make sense.  He complains of being cold - when I asked him to straighten his right leg out (it was behind his left leg), he says he was "just putting it behind this thing to keep it warm" and seems unaware completely of his left side.  He reports that he needs to call his work so he doesn't get fired, but then didn't actually want to use the phone. He reports headache.  I called and spoke with his sister.  She was notified this AM that he was taken here.  She reports that he went out Saturday night to a family member's birthday party; they took him home and he was very intoxicated.  He was placed on the bed after 0200.  His girlfriend noted him on the floor sometime later.  She found him there again sometime later and had someone help him into the bed.  He was not moving on one side.  He apparently fell back on the floor at some point.  This AM, she finally called 911.  He has chronic back pain from a remote accident.  He has been under a lot of stress - including with his live-in girlfriend.  He also has a 41yo son.   ED Course:  EMS called for a fall, noted left-sided weakness, facial droop, slurred speech, rightward gaze.  CT consistent with R MCA CVA.  Neuro has seen, MRI recommended.  Temp 100.5, given empiric abx, no symptoms or apparent source.  **Interim History Currently being worked up for stroke and currently there is no definitive cause so patient undergoing a TEE tomorrow.  He did have a hemorrhagic conversion and neurology does recommend no dual antiplatelet therapy because of the hemorrhagic conversion.  Currently on aspirin  monotherapy.   Assessment & Plan:   Principal Problem:   Acute ischemic right MCA stroke (HCC) Active Problems:   Tobacco dependence   Marijuana dependence ([place])   Alcohol dependence ([place])   Cerebrovascular accident (CVA) due to thrombosis of right middle cerebral artery (HCC)   Chronic pain syndrome   Hypokalemia   Dysphagia, post-stroke  Acute CVA with Cerebral Edema with hemorrhagic conversion of the CVA -Patient was last seen normal at approximately 2 AM on Sunday morning, and has had recurrent falls -With significant left-sided deficits and cognitive impairment, as well as lethargy on examination which is improved since yesterday -CT head: Recent M1 segment, right middle cerebral artery infarct with mass-effect and possible petechial hemorrhage in portions of the right basal ganglia.  Proximal extent of postero superior right frontal and adjacent right parietal lobes. -CTA neck: neck no large vessel occlusion or significant stenosis -MRI head showed acute right MCA territory going basal ganglia and right parietal lobe.  Hemorrhagic transformation of the right basal ganglia infarct with mass-effect on the lateral ventricle -Echocardiogram showed EF of 33-29%, LV diastolic parameters normal. -Lower extremity Doppler showed no evidence of DVT -Carotid doppler: B/L 1-39% ICA stenosis.  Bilateral vertebral arteries demonstrate antegrade flow -LDL 138, hemoglobin A1c 5.6 -Full lipid panel showed a total cholesterol/HDL ratio of 4.3, cholesterol level 204, HDL 47, LDL 138, triglycerides of 94,  VLDL 19 -Continue with Atorvastatin 40 mg p.o. daily -Repeat head CT scan showed "Evolving right cerebral infarct with evidence for hemorrhagic transformation at the right basal ganglia, similar in appearance and distribution as compared to previous MRI. Associated mass effect with 3 mm of right-to-left shift. No hydrocephalus or ventricular trapping. 2. No other new acute intracranial  abnormality" -Neurology consulted and appreciated' -Dr. Erlinda Hong recommends TEE to look for embolic source once patient is more stable and this is scheduled for tomorrow. Dr. Erlinda Hong also considered 3% saline if needed but clinically feels improving -Hypercoagulable work-up ordered and his PTT lupus anticoagulant, DRVVT was negative and his ANA antibody was negative -Continue Aspirin 325 mg daily for now.  Holding DAPT given his hemorrhagic transformation. -Neurology recommending now blood pressure less than 160 given his hemorrhagic conversion the long-term blood pressure goal normotensive -PT and OT recommending CIR and CIR was consulted for further evaluation recommendations -SLP evaluated and recommended dysphagia 2 diet with nectar thick liquids and are going to defer recommending instrumental swallow assessment until tomorrow due to possible PE but TEE scheduled for tomorrow -Continue monitor and follow-up on SLP recommendations -CIR Physician Dr. Posey Pronto was consulted and feels that the patient would be a good candidate for CIR  Essential Hypertension -Placed on Amlodipine 10 mg po Daily  -Currently on metoprolol tartrate 5 mg IV every 12 -BP goal less than 160 given hemorrhagic conversion -He was ordered labetalol 20 mg IV once but was not given due to parameters not being met -BP is now 135/94  Alcohol Dependence -Continue CIWA protocol with IV lorazepam -No signs of withdrawal -Continue Folic acid, Multivitamin + Minerals, and Thiamine supplementation  Marijuana Dependence -UDS positive for THC -Continue with Cessation efforts   Tobacco Dependence -Continue Nicotine 7 mg TD Patch -Smoking Cessation Counseling given    SIRS -Patient with leukocytosis of 14.3 on admission and temperature of 100.5 F but this is now improved as his WBC is 9.2 -UA unremarkable for infection, urine culture shows no growth -Blood cultures pending showed no growth to date at 2 days -Suspect secondary to  the above -Improving and he is afebrile now  Hypokalemia -Mild as K+ was 3.4 -Replete with po Potassium Chloride 40 mEQ BID x2 Doses -Continue to Monitor and Replete as Necessary -Repeat CMP in AM   HLD -Full lipid panel showed a total cholesterol/HDL ratio of 4.3, cholesterol level 204, HDL 47, LDL 138, triglycerides of 94, VLDL 19 -Continue with Atorvastatin 40 mg p.o. daily  Headache -Secondary to above -Not amenable acetaminophen so we will try oxycodone as we will try and avoid NSAIDs due to hemorrhagic conversion  DVT prophylaxis: SCDs Code Status: FULL CODE  Family Communication: No family present at bedside Disposition Plan: Patient is from home and had a CVA which then had hemorrhagic conversion and requires CIR at discharge.  Will need to be fully worked up from a neurological standpoint and requires a TEE prior to discharge to [place] and likely this will be done tomorrow.  Anticipate discharging in the next 24 to 48 hours pending neurological clearance   Consultants:   Neurology  Cardiology for TEE   Procedures:  ECHOCARDIOGRAM IMPRESSIONS    1. Left ventricular ejection fraction, by estimation, is 60 to 65%. The  left ventricle has normal function. The left ventricle has no regional  wall motion abnormalities. Left ventricular diastolic parameters were  normal.  2. Right ventricular systolic function is normal. The right ventricular  size is normal.  3. The mitral valve is normal in structure. No evidence of mitral valve  regurgitation. No evidence of mitral stenosis.  4. The aortic valve is normal in structure. Aortic valve regurgitation is  not visualized. No aortic stenosis is present.  5. The inferior vena cava is normal in size with greater than 50%  respiratory variability, suggesting right atrial pressure of 3 mmHg.   FINDINGS  Left Ventricle: Left ventricular ejection fraction, by estimation, is 60  to 65%. The left ventricle has normal  function. The left ventricle has no  regional wall motion abnormalities. The left ventricular internal cavity  size was normal in size. There is  no left ventricular hypertrophy. Left ventricular diastolic parameters  were normal. Normal left ventricular filling pressure.   Right Ventricle: The right ventricular size is normal. No increase in  right ventricular wall thickness. Right ventricular systolic function is  normal.   Left Atrium: Left atrial size was normal in size.   Right Atrium: Right atrial size was normal in size.   Pericardium: There is no evidence of pericardial effusion.   Mitral Valve: The mitral valve is normal in structure. Normal mobility of  the mitral valve leaflets. No evidence of mitral valve regurgitation. No  evidence of mitral valve stenosis.   Tricuspid Valve: The tricuspid valve is normal in structure. Tricuspid  valve regurgitation is not demonstrated. No evidence of tricuspid  stenosis.   Aortic Valve: The aortic valve is normal in structure. Aortic valve  regurgitation is not visualized. No aortic stenosis is present.   Pulmonic Valve: The pulmonic valve was normal in structure. Pulmonic valve  regurgitation is not visualized. No evidence of pulmonic stenosis.   Aorta: The aortic root is normal in size and structure.   Venous: The inferior vena cava is normal in size with greater than 50%  respiratory variability, suggesting right atrial pressure of 3 mmHg.   IAS/Shunts: No atrial level shunt detected by color flow Doppler.     LEFT VENTRICLE  PLAX 2D  LVIDd:     5.40 cm Diastology  LVIDs:     3.20 cm LV e' lateral:  12.30 cm/s  LV PW:     1.00 cm LV E/e' lateral: 4.9  LV IVS:    0.90 cm LV e' medial:  10.20 cm/s  LVOT diam:   2.20 cm LV E/e' medial: 5.9  LV SV:     61  LV SV Index:  27  LVOT Area:   3.80 cm     RIGHT VENTRICLE  RV S prime:   12.50 cm/s  TAPSE (M-mode): 2.2 cm   LEFT ATRIUM        Index    RIGHT ATRIUM      Index  LA diam:    3.40 cm 1.50 cm/m RA Area:   14.80 cm  LA Vol (A2C):  46.5 ml 20.55 ml/m RA Volume:  33.20 ml 14.67 ml/m  LA Vol (A4C):  20.4 ml 9.01 ml/m  LA Biplane Vol: 30.8 ml 13.61 ml/m  AORTIC VALVE  LVOT Vmax:  86.80 cm/s  LVOT Vmean: 51.000 cm/s  LVOT VTI:  0.161 m    AORTA  Ao Root diam: 3.10 cm   MITRAL VALVE  MV Area (PHT): 3.31 cm  SHUNTS  MV Decel Time: 229 msec  Systemic VTI: 0.16 m  MV E velocity: 60.40 cm/s Systemic Diam: 2.20 cm  MV A velocity: 52.70 cm/s  MV E/A ratio: 1.15   LOWER EXTREMITY DUPLEX RIGHT:  -  There is no evidence of deep vein thrombosis in the lower extremity.    - No cystic structure found in the popliteal fossa.    LEFT:  - There is no evidence of deep vein thrombosis in the lower extremity.    - No cystic structure found in the popliteal fossa.      Antimicrobials: Anti-infectives (From admission, onward)   Start     Dose/Rate Route Frequency Ordered Stop   01/07/20 2200  vancomycin (VANCOREADY) IVPB 1500 mg/300 mL  Status:  Discontinued     1,500 mg 150 mL/hr over 120 Minutes Intravenous Every 12 hours 01/07/20 0834 01/07/20 1332   01/07/20 1400  ceFEPIme (MAXIPIME) 2 g in sodium chloride 0.9 % 100 mL IVPB  Status:  Discontinued     2 g 200 mL/hr over 30 Minutes Intravenous Every 8 hours 01/07/20 0834 01/07/20 1332   01/07/20 0830  ceFEPIme (MAXIPIME) 2 g in sodium chloride 0.9 % 100 mL IVPB     2 g 200 mL/hr over 30 Minutes Intravenous  Once 01/07/20 0826 01/07/20 0932   01/07/20 0830  metroNIDAZOLE (FLAGYL) IVPB 500 mg     500 mg 100 mL/hr over 60 Minutes Intravenous  Once 01/07/20 0826 01/07/20 1041   01/07/20 0830  vancomycin (VANCOCIN) IVPB 1000 mg/200 mL premix  Status:  Discontinued     1,000 mg 200 mL/hr over 60 Minutes Intravenous  Once 01/07/20 0826 01/07/20 0828   01/07/20 0830  vancomycin (VANCOREADY) IVPB 2000 mg/400 mL     2,000  mg 200 mL/hr over 120 Minutes Intravenous  Once 01/07/20 1505 01/07/20 1135     Subjective: Seen and examined at bedside he is complaining of a significant headache.  No nausea or vomiting.  Denies chest pain, lightheadedness or dizziness.  Still has some left-sided weakness but he thinks is slightly improved.  No other concerns or complaints at this time.  Objective: Vitals:   01/09/20 0450 01/09/20 0803 01/09/20 0939 01/09/20 1141  BP: 128/80 131/86 (!) 141/83 (!) 135/94  Pulse: 62 (!) 57  60  Resp: (!) 21 18  18   Temp: 98.2 F (36.8 C) 99.5 F (37.5 C)  98.6 F (37 C)  TempSrc: Axillary Oral  Oral  SpO2: 100% 98%  97%  Weight:      Height:        Intake/Output Summary (Last 24 hours) at 01/09/2020 1346 Last data filed at 01/09/2020 1200 Gross per 24 hour  Intake 460 ml  Output 850 ml  Net -390 ml   Filed Weights   01/07/20 0659  Weight: 99.8 kg   Examination: Physical Exam:  Constitutional: WN/WD overweight, NAD and appears calm slightly uncomfortable complaining of a headache Eyes: Lids and conjunctivae normal, sclerae anicteric  ENMT: External Ears, Nose appear normal. Grossly normal hearing.  Has a slight facial droop on the left Neck: Appears normal, supple, no cervical masses, normal ROM, no appreciable thyromegaly; no JVD Respiratory: Diminished to auscultation bilaterally, no wheezing, rales, rhonchi or crackles. Normal respiratory effort and patient is not tachypenic. No accessory muscle use. Unlabored breathing  Cardiovascular: RRR, no murmurs / rubs / gallops. S1 and S2 auscultated. No extremity edema.  Abdomen: Soft, non-tender, Distended slightly 2/2 body habitus. No masses palpated. No appreciable hepatosplenomegaly. Bowel sounds positive.  GU: Deferred. Musculoskeletal: No clubbing / cyanosis of digits/nails. No joint deformity upper and lower extremities.  Skin: No rashes, lesions, ulcers on a limited skin evaluation. No induration; Warm and dry.   Neurologic:  CN 2-12 grossly intact with no focal deficits but does have some weakness on the Left. Romberg sign and cerebellar reflexes not assessed.  Psychiatric: Normal judgment and insight. Alert and oriented x 3. Normal mood and appropriate affect.   Data Reviewed: I have personally reviewed following labs and imaging studies  CBC: Recent Labs  Lab 01/07/20 0703 01/09/20 0429  WBC 14.3* 9.2  NEUTROABS 12.6*  --   HGB 14.8 14.2  HCT 44.7 42.4  MCV 88.9 88.1  PLT 314 785   Basic Metabolic Panel: Recent Labs  Lab 01/07/20 0703 01/07/20 1644 01/09/20 0429  NA 140  --  140  K 4.0  --  3.4*  CL 102  --  107  CO2 23  --  24  GLUCOSE 134*  --  98  BUN 8  --  9  CREATININE 1.19  --  0.88  CALCIUM 9.6  --  8.9  MG  --  2.1  --   PHOS  --  3.3  --    GFR: Estimated Creatinine Clearance: 135.2 mL/min (by C-G formula based on SCr of 0.88 mg/dL). Liver Function Tests: Recent Labs  Lab 01/07/20 0703  AST 43*  ALT 31  ALKPHOS 85  BILITOT 0.8  PROT 7.5  ALBUMIN 4.2   No results for input(s): LIPASE, AMYLASE in the last 168 hours. No results for input(s): AMMONIA in the last 168 hours. Coagulation Profile: Recent Labs  Lab 01/07/20 0703  INR 1.0   Cardiac Enzymes: Recent Labs  Lab 01/07/20 0703  CKTOTAL 787*   BNP (last 3 results) No results for input(s): PROBNP in the last 8760 hours. HbA1C: Recent Labs    01/08/20 0450  HGBA1C 5.6   CBG: No results for input(s): GLUCAP in the last 168 hours. Lipid Profile: Recent Labs    01/08/20 0450  CHOL 204*  HDL 47  LDLCALC 138*  TRIG 94  CHOLHDL 4.3   Thyroid Function Tests: Recent Labs    01/07/20 1644  TSH 0.501   Anemia Panel: Recent Labs    01/08/20 1028  VITAMINB12 362   Sepsis Labs: Recent Labs  Lab 01/07/20 8850  LATICACIDVEN 1.9    Recent Results (from the past 240 hour(s))  Blood culture (routine x 2)     Status: None (Preliminary result)   Collection Time: 01/07/20  8:07 AM    Specimen: BLOOD RIGHT ARM  Result Value Ref Range Status   Specimen Description BLOOD RIGHT ARM  Final   Special Requests   Final    BOTTLES DRAWN AEROBIC AND ANAEROBIC Blood Culture adequate volume   Culture   Final    NO GROWTH 2 DAYS Performed at [facility], [place] [address], [place], [place] [address]    Report Status PENDING  Incomplete  SARS CORONAVIRUS 2 (TAT 6-24 HRS) Nasopharyngeal Nasopharyngeal Swab     Status: None   Collection Time: 01/07/20  9:34 AM   Specimen: Nasopharyngeal Swab  Result Value Ref Range Status   SARS Coronavirus 2 NEGATIVE NEGATIVE Final    Comment: (NOTE) SARS-CoV-2 target nucleic acids are NOT DETECTED. The SARS-CoV-2 RNA is generally detectable in upper and lower respiratory specimens during the acute phase of infection. Negative results do not preclude SARS-CoV-2 infection, do not rule out co-infections with other pathogens, and should not be used as the sole basis for treatment or other patient management decisions. Negative results must be combined with clinical observations, patient history, and epidemiological information. The expected  result is Negative. Fact Sheet for Patients: SugarRoll.be Fact Sheet for Healthcare Providers: https://www.woods-mathews.com/ This test is not yet approved or cleared by the [place] FDA and  has been authorized for detection and/or diagnosis of SARS-CoV-2 by FDA under an Emergency Use Authorization (EUA). This EUA will remain  in effect (meaning this test can be used) for the duration of the COVID-19 declaration under Section 56 4(b)(1) of the Act, 21 U.S.C. section 360bbb-3(b)(1), unless the authorization is terminated or revoked sooner. Performed at [facility], [place] [address], [place], [place] [address]   Urine culture     Status: None   Collection Time: 01/07/20 10:47 AM   Specimen: In/Out Cath Urine  Result Value Ref Range Status   Specimen  Description IN/OUT CATH URINE  Final   Special Requests NONE  Final   Culture   Final    NO GROWTH Performed at [facility], [place] [address] [place], [place] [address]    Report Status 01/08/2020 FINAL  Final     RN Pressure Injury Documentation:     Estimated body mass index is 28.25 kg/m as calculated from the following:   Height as of this encounter: 6' 2"  (1.88 m).   Weight as of this encounter: 99.8 kg.  Malnutrition Type:  Nutrition Problem: Predicted suboptimal nutrient intake Etiology: decreased appetite   Malnutrition Characteristics:  Signs/Symptoms: other (comment)(consult s/p stroke)   Nutrition Interventions:  Interventions: Ensure Enlive (each supplement provides 350kcal and 20 grams of protein), MVI   Radiology Studies: CT HEAD WO CONTRAST  Result Date: 01/09/2020 CLINICAL DATA:  Follow-up examination for acute stroke. EXAM: CT HEAD WITHOUT CONTRAST TECHNIQUE: Contiguous axial images were obtained from the base of the skull through the vertex without intravenous contrast. COMPARISON:  Prior MRI from 01/07/2020. FINDINGS: Brain: Evolving cytotoxic edema involving the right frontal lobe is seen, with involvement of primarily the right MCA distribution, although possible involvement of right ACA territory present as well. Overall, distribution similar to previous MRI. Evidence for hemorrhagic transformation with blood products seen at the right caudate and lentiform nuclei, also similar. Associated regional mass effect with up to 3 mm of right-to-left shift. Right lateral ventricle partially effaced. No hydrocephalus or ventricular trapping. Basilar cisterns remain patent. No new large vessel territory infarct or intracranial hemorrhage. No extra-axial fluid collection. No mass lesion. Vascular: No hyperdense vessel. Skull: Scalp soft tissues and calvarium demonstrate no new finding. Sinuses/Orbits: Globes and orbital soft tissues demonstrate no acute finding.  Left greater than right sphenoid sinus disease. Paranasal sinuses are otherwise largely clear. Trace left mastoid effusion noted. Other: None. IMPRESSION: 1. Evolving right cerebral infarct with evidence for hemorrhagic transformation at the right basal ganglia, similar in appearance and distribution as compared to previous MRI. Associated mass effect with 3 mm of right-to-left shift. No hydrocephalus or ventricular trapping. 2. No other new acute intracranial abnormality. Electronically Signed   By: Jeannine Boga M.D.   On: 01/09/2020 03:18   CT ANGIO NECK W OR WO CONTRAST  Result Date: 01/08/2020 CLINICAL DATA:  Right MCA stroke EXAM: CT ANGIOGRAPHY NECK TECHNIQUE: Multidetector CT imaging of the neck was performed using the standard protocol during bolus administration of intravenous contrast. Multiplanar CT image reconstructions and MIPs were obtained to evaluate the vascular anatomy. Carotid stenosis measurements (when applicable) are obtained utilizing NASCET criteria, using the distal internal carotid diameter as the denominator. CONTRAST:  75 mL OMNIPAQUE IOHEXOL 350 MG/ML SOLN COMPARISON:  None. FINDINGS: Aortic  arch: Great vessel origins are patent. Right carotid system: Patent. Minimal calcified plaque at the ICA origin. There is no measurable stenosis or evidence of dissection. Left carotid system: Patent. There is no measurable stenosis or evidence of dissection. Vertebral arteries: Patent. No measurable stenosis or evidence of dissection. Skeleton: Mild degenerative changes, greatest at C6-C7 and C5-C6. Other neck: No mass or adenopathy. Upper chest: No apical lung mass. IMPRESSION: No large vessel occlusion or hemodynamically significant stenosis. Electronically Signed   By: Macy Mis M.D.   On: 01/08/2020 10:14   MR ANGIO HEAD WO CONTRAST  Result Date: 01/07/2020 CLINICAL DATA:  Stroke. Last seen normal 0200 hours on Saturday. Left-sided deficits. EXAM: MRI HEAD WITHOUT CONTRAST MRA  HEAD WITHOUT CONTRAST TECHNIQUE: Multiplanar, multiecho pulse sequences of the brain and surrounding structures were obtained without intravenous contrast. Angiographic images of the head were obtained using MRA technique without contrast. COMPARISON:  CT head 01/07/2020 FINDINGS: MRI HEAD FINDINGS Brain: Acute infarct right posterior MCA territory involving the right parietal cortex extending over the medial convexity. Patchy acute infarct also in the right basal ganglia with moderate amount of associated hemorrhage. This hemorrhage is mildly hyperdense on CT most likely hemorrhagic transformation of basal ganglia infarct on the right. There is local mass-effect on the right lateral ventricle with minimal midline shift to the left. No hydrocephalus. No mass lesion. Vascular: Normal arterial flow voids. Skull and upper cervical spine: No focal skull lesion. Sinuses/Orbits: Mucosal edema paranasal sinuses with air-fluid level in the sphenoid sinus. Negative orbit Other: None MRA HEAD FINDINGS Both vertebral arteries widely patent to the basilar. PICA patent bilaterally. Basilar widely patent. Superior cerebellar and posterior cerebral arteries widely patent bilaterally. Left internal carotid artery widely patent. Left anterior and middle cerebral arteries widely patent. Azygos A2 segment. Right internal carotid artery widely patent. Right A1 segment patent. Common A2 segment. Right M1 segment patent. Moderate stenosis in the anterior division of the right M2 branch. No large vessel occlusion. IMPRESSION: Acute right MCA territory involving basal ganglia and right parietal lobe. There is hemorrhagic transformation of the right basal ganglia infarct with mass-effect on the lateral ventricle. Right M1 segment widely patent. Moderate stenosis right M2 segment anteriorly. Presumably the thrombus has recanalized given the symptoms are greater than 48 hours old. Electronically Signed   By: Franchot Gallo M.D.   On:  01/07/2020 15:22   MR BRAIN WO CONTRAST  Result Date: 01/07/2020 CLINICAL DATA:  Stroke. Last seen normal 0200 hours on Saturday. Left-sided deficits. EXAM: MRI HEAD WITHOUT CONTRAST MRA HEAD WITHOUT CONTRAST TECHNIQUE: Multiplanar, multiecho pulse sequences of the brain and surrounding structures were obtained without intravenous contrast. Angiographic images of the head were obtained using MRA technique without contrast. COMPARISON:  CT head 01/07/2020 FINDINGS: MRI HEAD FINDINGS Brain: Acute infarct right posterior MCA territory involving the right parietal cortex extending over the medial convexity. Patchy acute infarct also in the right basal ganglia with moderate amount of associated hemorrhage. This hemorrhage is mildly hyperdense on CT most likely hemorrhagic transformation of basal ganglia infarct on the right. There is local mass-effect on the right lateral ventricle with minimal midline shift to the left. No hydrocephalus. No mass lesion. Vascular: Normal arterial flow voids. Skull and upper cervical spine: No focal skull lesion. Sinuses/Orbits: Mucosal edema paranasal sinuses with air-fluid level in the sphenoid sinus. Negative orbit Other: None MRA HEAD FINDINGS Both vertebral arteries widely patent to the basilar. PICA patent bilaterally. Basilar widely patent. Superior cerebellar and posterior cerebral  arteries widely patent bilaterally. Left internal carotid artery widely patent. Left anterior and middle cerebral arteries widely patent. Azygos A2 segment. Right internal carotid artery widely patent. Right A1 segment patent. Common A2 segment. Right M1 segment patent. Moderate stenosis in the anterior division of the right M2 branch. No large vessel occlusion. IMPRESSION: Acute right MCA territory involving basal ganglia and right parietal lobe. There is hemorrhagic transformation of the right basal ganglia infarct with mass-effect on the lateral ventricle. Right M1 segment widely patent. Moderate  stenosis right M2 segment anteriorly. Presumably the thrombus has recanalized given the symptoms are greater than 48 hours old. Electronically Signed   By: Franchot Gallo M.D.   On: 01/07/2020 15:22   ECHOCARDIOGRAM COMPLETE  Result Date: 01/08/2020    ECHOCARDIOGRAM REPORT   Patient Name:   BAKARI NIKOLAI Date of Exam: 01/08/2020 Medical Rec #:  [id number]        Height:       74.0 in Accession #:    [id number]       Weight:       220.0 lb Date of Birth:  05-22-1976         BSA:          2.263 m Patient Age:    89 years         BP:           147/115 mmHg Patient Gender: M                HR:           77 bpm. Exam Location:  Inpatient Procedure: 2D Echo Indications:    434.91 stroke  History:        Patient has no prior history of Echocardiogram examinations.                 Stroke; Risk Factors:Current Smoker. ETOH & Tobacco use.  Sonographer:    Jannett Celestine RDCS (AE) Referring Phys: [id number] JENNIFER YATES  Sonographer Comments: Image acquisition challenging due to respiratory motion. extremely limited patient mobility IMPRESSIONS  1. Left ventricular ejection fraction, by estimation, is 60 to 65%. The left ventricle has normal function. The left ventricle has no regional wall motion abnormalities. Left ventricular diastolic parameters were normal.  2. Right ventricular systolic function is normal. The right ventricular size is normal.  3. The mitral valve is normal in structure. No evidence of mitral valve regurgitation. No evidence of mitral stenosis.  4. The aortic valve is normal in structure. Aortic valve regurgitation is not visualized. No aortic stenosis is present.  5. The inferior vena cava is normal in size with greater than 50% respiratory variability, suggesting right atrial pressure of 3 mmHg. FINDINGS  Left Ventricle: Left ventricular ejection fraction, by estimation, is 60 to 65%. The left ventricle has normal function. The left ventricle has no regional wall motion abnormalities. The left  ventricular internal cavity size was normal in size. There is  no left ventricular hypertrophy. Left ventricular diastolic parameters were normal. Normal left ventricular filling pressure. Right Ventricle: The right ventricular size is normal. No increase in right ventricular wall thickness. Right ventricular systolic function is normal. Left Atrium: Left atrial size was normal in size. Right Atrium: Right atrial size was normal in size. Pericardium: There is no evidence of pericardial effusion. Mitral Valve: The mitral valve is normal in structure. Normal mobility of the mitral valve leaflets. No evidence of mitral valve regurgitation. No evidence of mitral valve stenosis. Tricuspid  Valve: The tricuspid valve is normal in structure. Tricuspid valve regurgitation is not demonstrated. No evidence of tricuspid stenosis. Aortic Valve: The aortic valve is normal in structure. Aortic valve regurgitation is not visualized. No aortic stenosis is present. Pulmonic Valve: The pulmonic valve was normal in structure. Pulmonic valve regurgitation is not visualized. No evidence of pulmonic stenosis. Aorta: The aortic root is normal in size and structure. Venous: The inferior vena cava is normal in size with greater than 50% respiratory variability, suggesting right atrial pressure of 3 mmHg. IAS/Shunts: No atrial level shunt detected by color flow Doppler.  LEFT VENTRICLE PLAX 2D LVIDd:         5.40 cm  Diastology LVIDs:         3.20 cm  LV e' lateral:   12.30 cm/s LV PW:         1.00 cm  LV E/e' lateral: 4.9 LV IVS:        0.90 cm  LV e' medial:    10.20 cm/s LVOT diam:     2.20 cm  LV E/e' medial:  5.9 LV SV:         61 LV SV Index:   27 LVOT Area:     3.80 cm  RIGHT VENTRICLE RV S prime:     12.50 cm/s TAPSE (M-mode): 2.2 cm LEFT ATRIUM             Index       RIGHT ATRIUM           Index LA diam:        3.40 cm 1.50 cm/m  RA Area:     14.80 cm LA Vol (A2C):   46.5 ml 20.55 ml/m RA Volume:   33.20 ml  14.67 ml/m LA Vol  (A4C):   20.4 ml 9.01 ml/m LA Biplane Vol: 30.8 ml 13.61 ml/m  AORTIC VALVE LVOT Vmax:   86.80 cm/s LVOT Vmean:  51.000 cm/s LVOT VTI:    0.161 m  AORTA Ao Root diam: 3.10 cm MITRAL VALVE MV Area (PHT): 3.31 cm    SHUNTS MV Decel Time: 229 msec    Systemic VTI:  0.16 m MV E velocity: 60.40 cm/s  Systemic Diam: 2.20 cm MV A velocity: 52.70 cm/s MV E/A ratio:  1.15 Mihai Croitoru MD Electronically signed by Sanda Klein MD Signature Date/Time: 01/08/2020/2:37:28 PM    Final    VAS US CAROTID (at [facility] and [facility] only)  Result Date: 01/07/2020 Carotid Arterial Duplex Study Indications:       CVA. Risk Factors:      Prior CVA. Comparison Study:  no prior Performing Technologist: Abram Sander RVS  Examination Guidelines: A complete evaluation includes B-mode imaging, spectral Doppler, color Doppler, and power Doppler as needed of all accessible portions of each vessel. Bilateral testing is considered an integral part of a complete examination. Limited examinations for reoccurring indications may be performed as noted.  Right Carotid Findings: +----------+--------+--------+--------+------------------+--------+             PSV cm/s EDV cm/s Stenosis Plaque Description Comments  +----------+--------+--------+--------+------------------+--------+  CCA Prox   75       7                 heterogenous                 +----------+--------+--------+--------+------------------+--------+  CCA Distal 57       13  heterogenous                 +----------+--------+--------+--------+------------------+--------+  ICA Prox   85       36       1-39%    heterogenous       tortuous  +----------+--------+--------+--------+------------------+--------+  ICA Distal 132      60                                   tortuous  +----------+--------+--------+--------+------------------+--------+  ECA        81       14                                             +----------+--------+--------+--------+------------------+--------+  +----------+--------+-------+--------+-------------------+             PSV cm/s EDV cms Describe Arm Pressure (mmHG)  +----------+--------+-------+--------+-------------------+  Subclavian 77       17                                    +----------+--------+-------+--------+-------------------+ +---------+--------+--+--------+--+---------+  Vertebral PSV cm/s 60 EDV cm/s 24 Antegrade  +---------+--------+--+--------+--+---------+  Left Carotid Findings: +----------+--------+--------+--------+------------------+--------+             PSV cm/s EDV cm/s Stenosis Plaque Description Comments  +----------+--------+--------+--------+------------------+--------+  CCA Prox   77       17                heterogenous                 +----------+--------+--------+--------+------------------+--------+  CCA Distal 71       18                heterogenous                 +----------+--------+--------+--------+------------------+--------+  ICA Prox   59       21       1-39%    heterogenous                 +----------+--------+--------+--------+------------------+--------+  ICA Distal 66       24                                             +----------+--------+--------+--------+------------------+--------+  ECA        51       10                                             +----------+--------+--------+--------+------------------+--------+ +----------+--------+--------+--------+-------------------+             PSV cm/s EDV cm/s Describe Arm Pressure (mmHG)  +----------+--------+--------+--------+-------------------+  Subclavian 83                                              +----------+--------+--------+--------+-------------------+ +---------+--------+--+--------+--+---------+  Vertebral PSV cm/s 73 EDV cm/s 13 Antegrade  +---------+--------+--+--------+--+---------+  Summary: Right Carotid: Velocities in the right ICA are consistent with a 1-39% stenosis. Left Carotid: Velocities in the left ICA are consistent with a 1-39% stenosis.  Vertebrals: Bilateral vertebral arteries demonstrate antegrade flow. *See table(s) above for measurements and observations.     Preliminary    VAS [place] LOWER EXTREMITY VENOUS (DVT)  Result Date: 01/08/2020  Lower Venous DVTStudy Indications: Stroke.  Comparison Study: No prior study Performing Technologist: Maudry Mayhew MHA, RDMS, RVT, RDCS  Examination Guidelines: A complete evaluation includes B-mode imaging, spectral Doppler, color Doppler, and power Doppler as needed of all accessible portions of each vessel. Bilateral testing is considered an integral part of a complete examination. Limited examinations for reoccurring indications may be performed as noted. The reflux portion of the exam is performed with the patient in reverse Trendelenburg.  +---------+---------------+---------+-----------+----------+--------------+  RIGHT     Compressibility Phasicity Spontaneity Properties Thrombus Aging  +---------+---------------+---------+-----------+----------+--------------+  CFV       Full            Yes       Yes                                    +---------+---------------+---------+-----------+----------+--------------+  SFJ       Full                                                             +---------+---------------+---------+-----------+----------+--------------+  FV Prox   Full                                                             +---------+---------------+---------+-----------+----------+--------------+  FV Mid    Full                                                             +---------+---------------+---------+-----------+----------+--------------+  FV Distal Full                                                             +---------+---------------+---------+-----------+----------+--------------+  PFV       Full                                                             +---------+---------------+---------+-----------+----------+--------------+  POP       Full            Yes       Yes                                     +---------+---------------+---------+-----------+----------+--------------+  PTV       Full                                                             +---------+---------------+---------+-----------+----------+--------------+  PERO      Full                                                             +---------+---------------+---------+-----------+----------+--------------+   +---------+---------------+---------+-----------+----------+--------------+  LEFT      Compressibility Phasicity Spontaneity Properties Thrombus Aging  +---------+---------------+---------+-----------+----------+--------------+  CFV       Full            Yes       Yes                                    +---------+---------------+---------+-----------+----------+--------------+  SFJ       Full                                                             +---------+---------------+---------+-----------+----------+--------------+  FV Prox   Full                                                             +---------+---------------+---------+-----------+----------+--------------+  FV Mid    Full                                                             +---------+---------------+---------+-----------+----------+--------------+  FV Distal Full                                                             +---------+---------------+---------+-----------+----------+--------------+  PFV       Full                                                             +---------+---------------+---------+-----------+----------+--------------+  POP       Full            Yes       Yes                                    +---------+---------------+---------+-----------+----------+--------------+  PTV       Full                                                             +---------+---------------+---------+-----------+----------+--------------+  PERO      Full                                                              +---------+---------------+---------+-----------+----------+--------------+     Summary: RIGHT: - There is no evidence of deep vein thrombosis in the lower extremity.  - No cystic structure found in the popliteal fossa.  LEFT: - There is no evidence of deep vein thrombosis in the lower extremity.  - No cystic structure found in the popliteal fossa.  *See table(s) above for measurements and observations. Electronically signed by Harold Barban MD on 01/08/2020 at 5:49:38 PM.    Final    Scheduled Meds:  amLODipine  10 mg Oral Daily   aspirin  300 mg Rectal Daily   Or   aspirin  325 mg Oral Daily   atorvastatin  40 mg Oral q1800   feeding supplement (ENSURE ENLIVE)  237 mL Oral BID BM   folic acid  1 mg Oral Daily   labetalol  20 mg Intravenous Once   metoprolol tartrate  5 mg Intravenous Q12H   multivitamin with minerals  1 tablet Oral Daily   nicotine  7 mg Transdermal Daily   potassium chloride  40 mEq Oral BID   thiamine  100 mg Oral Daily   Or   thiamine  100 mg Intravenous Daily   Continuous Infusions:  sodium chloride 50 mL/hr at 01/09/20 1343    LOS: 2 days   Kerney Elbe, DO [facility] PAGER is on AMION  If 7PM-7AM, please contact night-coverage [email or web address]

## 2020-01-09 NOTE — Progress Notes (Signed)
SLP Cancellation Note  Patient Details Name: Derek [name] MRN: [id number] DOB: 10/09/1976   Cancelled treatment:       Reason Eval/Treat Not Completed: Patient at procedure or test/unavailable. Pt NPO for a TEE around noon today. Will defer recommended instrumental swallow assessment until 3/11. Resume recommended diet, Dys 2/nectar after TEE today.    Tedra Coppernoll, Riley Nearing 01/09/2020, 8:21 AM

## 2020-01-09 NOTE — Progress Notes (Signed)
Patient returned from CT

## 2020-01-09 NOTE — H&P (View-Only) (Signed)
STROKE TEAM PROGRESS NOTE   INTERVAL HISTORY Patient sitting in chair, awake alert, orientated.  Mental status much improved from yesterday.  Admitted that he drinks beers 3-4 every day, and 1 pack/day smoking.  He denies any heart palpitation.  Still has left hemiplegia and facial droop.  Plan for TEE tomorrow.  Vitals:   01/09/20 0450 01/09/20 0803 01/09/20 0939 01/09/20 1141  BP: 128/80 131/86 (!) 141/83 (!) 135/94  Pulse: 62 (!) 57  60  Resp: (!) 21 18  18  Temp: 98.2 F (36.8 C) 99.5 F (37.5 C)  98.6 F (37 C)  TempSrc: Axillary Oral  Oral  SpO2: 100% 98%  97%  Weight:      Height:        CBC:  Recent Labs  Lab 01/07/20 0703 01/09/20 0429  WBC 14.3* 9.2  NEUTROABS 12.6*  --   HGB 14.8 14.2  HCT 44.7 42.4  MCV 88.9 88.1  PLT 314 256    Basic Metabolic Panel:  Recent Labs  Lab 01/07/20 0703 01/07/20 1644 01/09/20 0429  NA 140  --  140  K 4.0  --  3.4*  CL 102  --  107  CO2 23  --  24  GLUCOSE 134*  --  98  BUN 8  --  9  CREATININE 1.19  --  0.88  CALCIUM 9.6  --  8.9  MG  --  2.1  --   PHOS  --  3.3  --    Lipid Panel:     Component Value Date/Time   CHOL 204 (H) 01/08/2020 0450   TRIG 94 01/08/2020 0450   HDL 47 01/08/2020 0450   CHOLHDL 4.3 01/08/2020 0450   VLDL 19 01/08/2020 0450   LDLCALC 138 (H) 01/08/2020 0450   HgbA1c:  Lab Results  Component Value Date   HGBA1C 5.6 01/08/2020   Urine Drug Screen:     Component Value Date/Time   LABOPIA NONE DETECTED 01/07/2020 1434   COCAINSCRNUR NONE DETECTED 01/07/2020 1434   LABBENZ NONE DETECTED 01/07/2020 1434   AMPHETMU NONE DETECTED 01/07/2020 1434   THCU POSITIVE (A) 01/07/2020 1434   LABBARB NONE DETECTED 01/07/2020 1434    Alcohol Level No results found for: ETH  IMAGING past 24 hours CT HEAD WO CONTRAST  Result Date: 01/09/2020 CLINICAL DATA:  Follow-up examination for acute stroke. EXAM: CT HEAD WITHOUT CONTRAST TECHNIQUE: Contiguous axial images were obtained from the base of  the skull through the vertex without intravenous contrast. COMPARISON:  Prior MRI from 01/07/2020. FINDINGS: Brain: Evolving cytotoxic edema involving the right frontal lobe is seen, with involvement of primarily the right MCA distribution, although possible involvement of right ACA territory present as well. Overall, distribution similar to previous MRI. Evidence for hemorrhagic transformation with blood products seen at the right caudate and lentiform nuclei, also similar. Associated regional mass effect with up to 3 mm of right-to-left shift. Right lateral ventricle partially effaced. No hydrocephalus or ventricular trapping. Basilar cisterns remain patent. No new large vessel territory infarct or intracranial hemorrhage. No extra-axial fluid collection. No mass lesion. Vascular: No hyperdense vessel. Skull: Scalp soft tissues and calvarium demonstrate no new finding. Sinuses/Orbits: Globes and orbital soft tissues demonstrate no acute finding. Left greater than right sphenoid sinus disease. Paranasal sinuses are otherwise largely clear. Trace left mastoid effusion noted. Other: None. IMPRESSION: 1. Evolving right cerebral infarct with evidence for hemorrhagic transformation at the right basal ganglia, similar in appearance and distribution as compared to previous   MRI. Associated mass effect with 3 mm of right-to-left shift. No hydrocephalus or ventricular trapping. 2. No other new acute intracranial abnormality. Electronically Signed   By: Jeannine Boga M.D.   On: 01/09/2020 03:18    PHYSICAL EXAM  Temp:  [97.6 F (36.4 C)-99.5 F (37.5 C)] 98.6 F (37 C) (03/10 1141) Pulse Rate:  [50-72] 60 (03/10 1141) Resp:  [16-21] 18 (03/10 1141) BP: (126-150)/(80-94) 135/94 (03/10 1141) SpO2:  [96 %-100 %] 97 % (03/10 1141)  General - Well nourished, well developed, in no apparent distress.  Ophthalmologic - fundi not visualized due to noncooperation.  Cardiovascular - Regular rhythm and  rate.  Neuro - awake alert and orientated x 4. PERRL, EOMI. Visual field full. Significant left facial droop, tongue protrusion to the left. RUE and RLE strong at least 4/5, LUE 0/5 and LLE 1/5 with pain. DTR diminished on the left and negative babinski. Sensation symmetrical, coordination intact right finger-to-nose and gait not able to be tested.   ASSESSMENT/PLAN [name] is a 44 y.o. male with history of tobacco, marijuana and alcohol use with limited healthcare presenting following frequent falls over the weekend with L sided hemiparesis, R gaze deviation, HA and incontinence.   Stroke:   R MCA infarct w/ hemorrhagic transformation, infarct embolic pattern, secondary to unknown source  CT head recent R MCA M1 infarct w/ mass effect and petechial hemorrhage in R basal ganglia. Infarct extends to R frontal and R parietal lobes. Likely thrombus R M1.  MRI  R MCA infarct R basal ganglia, R parietal lobe. Hemorrhagic transformation in R basal ganglia w/ mass effect on lateral ventricle.   MRA  R M1 patent. Moderate R M2 stenosis.  CTA neck unremarkable  CT repeated 3/10 evolving right MCA infarct with hemorrhagic transformation, 3 mm right-to-left shift  2D Echo EF 60-65%. No source of embolus   LE doppler no DVT  TEE Thursday at [facility]    Hypercoagulable labs neg so far   LDL 138  HgbA1c 5.6  Lovenox changed to SCDs for VTE prophylaxis during the night d/t hemorrhagic transformation  No antithrombotic prior to admission, now on aspirin 325 mg daily. Hold DAPT at this time given hemorrhagic transformation   Therapy recommendations:  CIR  Disposition:  pending   Cerebral edema  CT showed right MCA infarct with mass effect  MRI right MCA infarct with mass effect on lateral ventricle with minimal MLS  Pt drowsy sleepy  Repeat CT in am evolving right MCA infarct with hemorrhagic conversion, 3 mm MLS  No 3% saline needed at this time  Hypertension  BP  elevated up to 189/106 . BP goal < 160 given hemorrhagic conversion . Added norvasc 10 . BP stable 130-140s . Long-term BP goal normotensive  Hyperlipidemia  Home meds:  No statin  Now on lipitor 40  LDL 138, goal < 70  Continue statin at discharge  SIRS - Fever and leukocytosis, resolved  Leukocytosis 14.3->9.2  TM 100.5 -> afebrile  UCx no growth  UA neg  B Cx no growth to date   Dysphagia . Secondary to stroke . On D2 nectar thick liquids . For MBSS following TEE completion . Speech on board  Tobacco abuse  Current smoker  Smoking cessation counseling will be provided  Nicotine patch provided  Alcohol abuse  On CIWA protocol  MVI/B1/FA  Seizure precaution  Other Stroke Risk Factors  Substance abuse - Marijuana dependence. UDS - THC POSITIVE, cessation education will be  provided  Overweight, Body mass index is 28.25 kg/m., recommend weight loss, diet and exercise as appropriate   Other Active Problems  Hypokalemia  Headache  Hospital day # 2   Marvel Plan, MD PhD Stroke Neurology 01/09/2020 2:51 PM    To contact Stroke Continuity provider, please refer to [email or web address]. After hours, contact General Neurology

## 2020-01-09 NOTE — Progress Notes (Signed)
Inpatient Rehab Admissions:  Inpatient Rehab Consult received.  I met with patient at the bedside for rehabilitation assessment and to discuss goals and expectations of an inpatient rehab admission.  He is open to CIR. States his significant other, Derek [name], works from home during the day and can provide assist.  I left her a voicemail to confirm.  Note plans for TEE tomorrow at noon.  Will await return call from [facility] and f/u with pt tomorrow.   Signed: Shann Medal, PT, DPT Admissions Coordinator [phone] 01/09/20  2:05 PM

## 2020-01-09 NOTE — Consult Note (Signed)
Physical Medicine and Rehabilitation Consult Reason for Consult: Left side weakness Referring Physician: [facility]   HPI: Derek [name] is a 44 y.o. right-handed male with history of alcohol/tobacco/marijuana use as well as chronic back pain.  History taken from chart review due to cognition/lethargy.  Patient lives with his girlfriend independent prior to admission.  He is employed as a Agricultural consultant.  Presented on 01/07/2020 with left hemiparesis.  Blood pressure 184/110.  Cranial CT scan shows right MCA infarction with mass-effect possible petechial hemorrhage in portions of the right basal ganglia.  Infarct extends into the posterior superior right frontal and adjacent right parietal lobe.  Suspect thrombus/embolus in the M1 segment of right middle cerebral artery.  MRI personally reviewed, showing acute right MCA infarct, involving basal ganglia and right parietal lobe.  Hemorrhagic transformation of the right basal ganglia infarct with mass-effect on the lateral ventricle.  Echocardiogram with ejection fraction of 65%.  Lower extremity Dopplers negative for DVT.  Admission chemistries unremarkable except glucose 134, WBCs 14.3K, CK 787, blood culture no growth to date, lactic acid within normal limits 1.9, urine culture no growth, urine drug screen positive marijuana.  Neurology consulted plan for TEE.  Currently on aspirin 325 mg daily for CVA prophylaxis.  Follow-up cranial CT scan 01/09/2020 evolving right cerebral infarct with evidence for hemorrhagic transformation no hydrocephalus noted.  Associated mass-effect with 3 mm right to left shift.  Plan to hold DAPT at present due to hemorrhagic transformation.  Hospital course further complicated by postop dysphagia.  Therapy evaluations completed with recommendations of physical medicine rehab consult.  Review of Systems  Unable to perform ROS: Mental acuity   Past Medical History:  Diagnosis Date  . 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 Alcohol dependence (HCC)   . Back pain   . Marijuana dependence (HCC)   . Tobacco dependence    No past surgical history. Family History  Problem Relation Age of Onset  . Stroke Neg Hx   . CAD Neg Hx    Social History:  reports that he has been smoking cigarettes. He has been smoking about 0.20 packs per day. He has never used smokeless tobacco. He reports current alcohol use. He reports current drug use. Drug: Marijuana. Allergies: No Known Allergies Medications Prior to Admission  Medication Sig Dispense Refill  . ferrous sulfate 325 (65 FE) MG tablet Take 325 mg by mouth 2 (two) times daily.  2  . ibuprofen (ADVIL,MOTRIN) 200 MG tablet Take 400 mg by mouth every 6 (six) hours as needed. For pain      Home: Home Living Family/patient expects to be discharged to:: Inpatient rehab  Lives With: Significant other, Other (Comment)(children)  Functional History: Prior Function Level of Independence: Independent 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[employer] Functional Status:  Mobility: Bed Mobility Overal bed mobility: Needs Assistance Bed Mobility: Supine to Sit Supine to sit: +2 for physical assistance, +2 for safety/equipment, Max assist General bed mobility comments: hob elevated to help facilitation initiation of pushing into sitting Transfers Overall transfer level: Needs assistance Transfers: Sit to/from Stand, Stand Pivot Transfers Sit to Stand: +2 physical assistance, Mod assist Stand pivot transfers: +2 physical assistance, Max assist, From elevated surface General transfer comment: pt does not initiate stepping with R LE. pt bares weight on L LE but lacks awareness and needs assist for knee and hip blocking Ambulation/Gait General Gait Details: unable    ADL: ADL Overall ADL's : Needs  assistance/impaired Eating/Feeding: NPO Grooming: Wash/dry hands, Minimal assistance, Bed level Grooming Details (indicate cue type and reason): pt terminates task  prematurely. pt leaving wash cloth over eyes. pt states "okay" when asked to remove. Pt hears phone ring and spontaneously pulls wrap off face Upper Body Bathing: Maximal assistance Lower Body Bathing: Total assistance Upper Body Dressing : Maximal assistance Lower Body Dressing: Total assistance Toilet Transfer: +2 for physical assistance, Maximal assistance, +2 for safety/equipment General ADL Comments: pt with L lateral lean and lacks awarness to L deficits  Cognition: Cognition Overall Cognitive Status: Difficult to assess Arousal/Alertness: Lethargic Orientation Level: Oriented X4 Attention: Sustained Sustained Attention: Appears intact Memory: Impaired Memory Impairment: Decreased short term memory Decreased Short Term Memory: Verbal basic Immediate Memory Recall: Sock, Blue, Bed Memory Recall Sock: Without Cue Memory Recall Blue: With Cue Memory Recall Bed: With Cue Awareness: Appears intact Problem Solving: Impaired Problem Solving Impairment: Verbal complex Safety/Judgment: Appears intact Cognition Arousal/Alertness: Lethargic Behavior During Therapy: Flat affect Overall Cognitive Status: Difficult to assess Difficult to assess due to: Level of arousal  Blood pressure 128/80, pulse 62, temperature 98.2 F (36.8 C), resp. rate (!) 21, height  (1.88 m), weight 99.8 kg, SpO2 100 %. Physical Exam  Vitals reviewed. Constitutional: He appears well-developed and well-nourished.  HENT:  Head: Normocephalic and atraumatic.  Eyes: Right eye exhibits no discharge. Left eye exhibits no discharge. No scleral icterus.  Neck: No tracheal deviation present. No thyromegaly present.  Respiratory: Effort normal. No respiratory distress.  GI: Soft. He exhibits no distension.  Musculoskeletal:     Comments: No edema or tenderness in extremities  Neurological:  Lethargic Coronary/RLE: 5/5 proximal to distal LUE/LLE: 0/5 proximal distal Left facial weakness Sensation appears  to be intact to light touch Right lean  Skin: Skin is warm and dry.  Psychiatric: His affect is blunt. His speech is delayed and slurred. He is slowed.    Results for orders placed or performed during the hospital encounter of 01/07/20 (from the past 24 hour(s))  Vitamin B12     Status: None   Collection Time: 01/08/20 10:28 AM  Result Value Ref Range   Vitamin B-12 362 180 - 914 pg/mL  RPR     Status: None   Collection Time: 01/08/20 10:28 AM  Result Value Ref Range   RPR Ser Ql NON REACTIVE NON REACTIVE   CT HEAD WO CONTRAST  Result Date: 01/09/2020 CLINICAL DATA:  Follow-up examination for acute stroke. EXAM: CT HEAD WITHOUT CONTRAST TECHNIQUE: Contiguous axial images were obtained from the base of the skull through the vertex without intravenous contrast. COMPARISON:  Prior MRI from 01/07/2020. FINDINGS: Brain: Evolving cytotoxic edema involving the right frontal lobe is seen, with involvement of primarily the right MCA distribution, although possible involvement of right ACA territory present as well. Overall, distribution similar to previous MRI. Evidence for hemorrhagic transformation with blood products seen at the right caudate and lentiform nuclei, also similar. Associated regional mass effect with up to 3 mm of right-to-left shift. Right lateral ventricle partially effaced. No hydrocephalus or ventricular trapping. Basilar cisterns remain patent. No new large vessel territory infarct or intracranial hemorrhage. No extra-axial fluid collection. No mass lesion. Vascular: No hyperdense vessel. Skull: Scalp soft tissues and calvarium demonstrate no new finding. Sinuses/Orbits: Globes and orbital soft tissues demonstrate no acute finding. Left greater than right sphenoid sinus disease. Paranasal sinuses are otherwise largely clear. Trace left mastoid effusion noted. Other: None. IMPRESSION: 1. Evolving right cerebral infarct with evidence  for hemorrhagic transformation at the right basal  ganglia, similar in appearance and distribution as compared to previous MRI. Associated mass effect with 3 mm of right-to-left shift. No hydrocephalus or ventricular trapping. 2. No other new acute intracranial abnormality. Electronically Signed   By: Rise MuBenjamin  McClintock M.D.   On: 01/09/2020 03:18   CT Head Wo Contrast  Result Date: 01/07/2020 CLINICAL DATA:  Pt came in [facility] from home with c/o of stroke symptoms. Pt was LSN at 0200 on Saturday. Pt has L-Sided Deficits, L-Sided Facial Droop, R-Sided Gaze. EMS states patient has alcohol on Friday and had a fall on Saturday and one again this morning. Pt is complaining of Head Pain. Pt had an episode of Incontinence. Pt present with untreated HTN. EXAM: CT HEAD WITHOUT CONTRAST TECHNIQUE: Contiguous axial images were obtained from the base of the skull through the vertex without intravenous contrast. COMPARISON:  10/29/2006 FINDINGS: Brain: There is patchy hypoattenuation involving the right basal ganglia including the external and internal capsules with hypoattenuation noted in a more patchy distribution extending superiorly and laterally into the posterior right frontal and adjacent right parietal lobes. Within the area of hypoattenuation, there is relative increased attenuation in portions of the caudate nucleus head and globus pallidus and putamen, which may reflect areas of petechial hemorrhage. There is associated mass effect partial effacement of the anterior horn and body of the right lateral ventricle, and slight midline shift to the left of 2-3 mm. No other areas of abnormal attenuation to suggest additional areas of infarct. No discrete masses. No evidence of hemorrhage other than the possible petechial hemorrhage in the right basal ganglia. No hydrocephalus. Vascular: Focus of increased attenuation noted right middle cerebral artery is suspicious for thrombus/embolus. Skull: Normal. Negative for fracture or focal lesion. Sinuses/Orbits: Globes and  orbits are unremarkable. Small amount of dependent fluid in the left sphenoid sinus. Other: None. IMPRESSION: 1. Recent M1 segment, right middle cerebral artery infarct, with mass effect and possible petechial hemorrhage in portions of the right basal ganglia. Infarct extends to the posterosuperior right frontal and adjacent right parietal lobes. Suspect thrombus/embolus in the M1 segment of right middle cerebral artery. 2. No other acute or recent intracranial abnormality. Electronically Signed   By: Amie Portlandavid  Ormond M.D.   On: 01/07/2020 07:40   CT ANGIO NECK W OR WO CONTRAST  Result Date: 01/08/2020 CLINICAL DATA:  Right MCA stroke EXAM: CT ANGIOGRAPHY NECK TECHNIQUE: Multidetector CT imaging of the neck was performed using the standard protocol during bolus administration of intravenous contrast. Multiplanar CT image reconstructions and MIPs were obtained to evaluate the vascular anatomy. Carotid stenosis measurements (when applicable) are obtained utilizing NASCET criteria, using the distal internal carotid diameter as the denominator. CONTRAST:  75 mL OMNIPAQUE IOHEXOL 350 MG/ML SOLN COMPARISON:  None. FINDINGS: Aortic arch: Great vessel origins are patent. Right carotid system: Patent. Minimal calcified plaque at the ICA origin. There is no measurable stenosis or evidence of dissection. Left carotid system: Patent. There is no measurable stenosis or evidence of dissection. Vertebral arteries: Patent. No measurable stenosis or evidence of dissection. Skeleton: Mild degenerative changes, greatest at C6-C7 and C5-C6. Other neck: No mass or adenopathy. Upper chest: No apical lung mass. IMPRESSION: No large vessel occlusion or hemodynamically significant stenosis. Electronically Signed   By: Guadlupe SpanishPraneil  Libertie Hausler M.D.   On: 01/08/2020 10:14   MR ANGIO HEAD WO CONTRAST  Result Date: 01/07/2020 CLINICAL DATA:  Stroke. Last seen normal 0200 hours on Saturday. Left-sided deficits. EXAM: MRI  HEAD WITHOUT CONTRAST MRA HEAD  WITHOUT CONTRAST TECHNIQUE: Multiplanar, multiecho pulse sequences of the brain and surrounding structures were obtained without intravenous contrast. Angiographic images of the head were obtained using MRA technique without contrast. COMPARISON:  CT head 01/07/2020 FINDINGS: MRI HEAD FINDINGS Brain: Acute infarct right posterior MCA territory involving the right parietal cortex extending over the medial convexity. Patchy acute infarct also in the right basal ganglia with moderate amount of associated hemorrhage. This hemorrhage is mildly hyperdense on CT most likely hemorrhagic transformation of basal ganglia infarct on the right. There is local mass-effect on the right lateral ventricle with minimal midline shift to the left. No hydrocephalus. No mass lesion. Vascular: Normal arterial flow voids. Skull and upper cervical spine: No focal skull lesion. Sinuses/Orbits: Mucosal edema paranasal sinuses with air-fluid level in the sphenoid sinus. Negative orbit Other: None MRA HEAD FINDINGS Both vertebral arteries widely patent to the basilar. PICA patent bilaterally. Basilar widely patent. Superior cerebellar and posterior cerebral arteries widely patent bilaterally. Left internal carotid artery widely patent. Left anterior and middle cerebral arteries widely patent. Azygos A2 segment. Right internal carotid artery widely patent. Right A1 segment patent. Common A2 segment. Right M1 segment patent. Moderate stenosis in the anterior division of the right M2 branch. No large vessel occlusion. IMPRESSION: Acute right MCA territory involving basal ganglia and right parietal lobe. There is hemorrhagic transformation of the right basal ganglia infarct with mass-effect on the lateral ventricle. Right M1 segment widely patent. Moderate stenosis right M2 segment anteriorly. Presumably the thrombus has recanalized given the symptoms are greater than 48 hours old. Electronically Signed   By: Franchot Gallo M.D.   On: 01/07/2020  15:22   MR BRAIN WO CONTRAST  Result Date: 01/07/2020 CLINICAL DATA:  Stroke. Last seen normal 0200 hours on Saturday. Left-sided deficits. EXAM: MRI HEAD WITHOUT CONTRAST MRA HEAD WITHOUT CONTRAST TECHNIQUE: Multiplanar, multiecho pulse sequences of the brain and surrounding structures were obtained without intravenous contrast. Angiographic images of the head were obtained using MRA technique without contrast. COMPARISON:  CT head 01/07/2020 FINDINGS: MRI HEAD FINDINGS Brain: Acute infarct right posterior MCA territory involving the right parietal cortex extending over the medial convexity. Patchy acute infarct also in the right basal ganglia with moderate amount of associated hemorrhage. This hemorrhage is mildly hyperdense on CT most likely hemorrhagic transformation of basal ganglia infarct on the right. There is local mass-effect on the right lateral ventricle with minimal midline shift to the left. No hydrocephalus. No mass lesion. Vascular: Normal arterial flow voids. Skull and upper cervical spine: No focal skull lesion. Sinuses/Orbits: Mucosal edema paranasal sinuses with air-fluid level in the sphenoid sinus. Negative orbit Other: None MRA HEAD FINDINGS Both vertebral arteries widely patent to the basilar. PICA patent bilaterally. Basilar widely patent. Superior cerebellar and posterior cerebral arteries widely patent bilaterally. Left internal carotid artery widely patent. Left anterior and middle cerebral arteries widely patent. Azygos A2 segment. Right internal carotid artery widely patent. Right A1 segment patent. Common A2 segment. Right M1 segment patent. Moderate stenosis in the anterior division of the right M2 branch. No large vessel occlusion. IMPRESSION: Acute right MCA territory involving basal ganglia and right parietal lobe. There is hemorrhagic transformation of the right basal ganglia infarct with mass-effect on the lateral ventricle. Right M1 segment widely patent. Moderate stenosis  right M2 segment anteriorly. Presumably the thrombus has recanalized given the symptoms are greater than 48 hours old. Electronically Signed   By: Franchot Gallo M.D.   On:  01/07/2020 15:22   DG Chest Port 1 View  Result Date: 01/07/2020 CLINICAL DATA:  Rule out infection.  Stroke symptoms EXAM: PORTABLE CHEST 1 VIEW COMPARISON:  12/07/2010 FINDINGS: Normal heart size and mediastinal contours. There is no edema, consolidation, effusion, or pneumothorax. Artifact from EKG leads. IMPRESSION: No evidence of active disease. Electronically Signed   By: Marnee Spring M.D.   On: 01/07/2020 07:44   ECHOCARDIOGRAM COMPLETE  Result Date: 01/08/2020    ECHOCARDIOGRAM REPORT   Patient Name:   RENLY ROOTS Date of Exam: 01/08/2020 Medical Rec #:  [id number]        Height:       74.0 in Accession #:    [id number]       Weight:       220.0 lb Date of Birth:  [birth year]         BSA:          2.263 m Patient Age:    43 years         BP:           147/115 mmHg Patient Gender: M                HR:           77 bpm. Exam Location:  Inpatient Procedure: 2D Echo Indications:    434.91 stroke  History:        Patient has no prior history of Echocardiogram examinations.                 Stroke; Risk Factors:Current Smoker. ETOH & Tobacco use.  Sonographer:    [name] RDCS (AE) Referring Phys: [id number] JENNIFER YATES  Sonographer Comments: Image acquisition challenging due to respiratory motion. extremely limited patient mobility IMPRESSIONS  1. Left ventricular ejection fraction, by estimation, is 60 to 65%. The left ventricle has normal function. The left ventricle has no regional wall motion abnormalities. Left ventricular diastolic parameters were normal.  2. Right ventricular systolic function is normal. The right ventricular size is normal.  3. The mitral valve is normal in structure. No evidence of mitral valve regurgitation. No evidence of mitral stenosis.  4. The aortic valve is normal in structure. Aortic valve  regurgitation is not visualized. No aortic stenosis is present.  5. The inferior vena cava is normal in size with greater than 50% respiratory variability, suggesting right atrial pressure of 3 mmHg. FINDINGS  Left Ventricle: Left ventricular ejection fraction, by estimation, is 60 to 65%. The left ventricle has normal function. The left ventricle has no regional wall motion abnormalities. The left ventricular internal cavity size was normal in size. There is  no left ventricular hypertrophy. Left ventricular diastolic parameters were normal. Normal left ventricular filling pressure. Right Ventricle: The right ventricular size is normal. No increase in right ventricular wall thickness. Right ventricular systolic function is normal. Left Atrium: Left atrial size was normal in size. Right Atrium: Right atrial size was normal in size. Pericardium: There is no evidence of pericardial effusion. Mitral Valve: The mitral valve is normal in structure. Normal mobility of the mitral valve leaflets. No evidence of mitral valve regurgitation. No evidence of mitral valve stenosis. Tricuspid Valve: The tricuspid valve is normal in structure. Tricuspid valve regurgitation is not demonstrated. No evidence of tricuspid stenosis. Aortic Valve: The aortic valve is normal in structure. Aortic valve regurgitation is not visualized. No aortic stenosis is present. Pulmonic Valve: The pulmonic valve was normal in structure. Pulmonic valve regurgitation is  not visualized. No evidence of pulmonic stenosis. Aorta: The aortic root is normal in size and structure. Venous: The inferior vena cava is normal in size with greater than 50% respiratory variability, suggesting right atrial pressure of 3 mmHg. IAS/Shunts: No atrial level shunt detected by color flow Doppler.  LEFT VENTRICLE PLAX 2D LVIDd:         5.40 cm  Diastology LVIDs:         3.20 cm  LV e' lateral:   12.30 cm/s LV PW:         1.00 cm  LV E/e' lateral: 4.9 LV IVS:        0.90 cm   LV e' medial:    10.20 cm/s LVOT diam:     2.20 cm  LV E/e' medial:  5.9 LV SV:         61 LV SV Index:   27 LVOT Area:     3.80 cm  RIGHT VENTRICLE RV S prime:     12.50 cm/s TAPSE (M-mode): 2.2 cm LEFT ATRIUM             Index       RIGHT ATRIUM           Index LA diam:        3.40 cm 1.50 cm/m  RA Area:     14.80 cm LA Vol (A2C):   46.5 ml 20.55 ml/m RA Volume:   33.20 ml  14.67 ml/m LA Vol (A4C):   20.4 ml 9.01 ml/m LA Biplane Vol: 30.8 ml 13.61 ml/m  AORTIC VALVE LVOT Vmax:   86.80 cm/s LVOT Vmean:  51.000 cm/s LVOT VTI:    0.161 m  AORTA Ao Root diam: 3.10 cm MITRAL VALVE MV Area (PHT): 3.31 cm    SHUNTS MV Decel Time: 229 msec    Systemic VTI:  0.16 m MV E velocity: 60.40 cm/s  Systemic Diam: 2.20 cm MV A velocity: 52.70 cm/s MV E/A ratio:  1.15 Mihai Croitoru MD Electronically signed by Thurmon Fair MD Signature Date/Time: 01/08/2020/2:37:28 PM    Final    VAS US CAROTID (at [facility] and [facility] only)  Result Date: 01/07/2020 Carotid Arterial Duplex Study Indications:       CVA. Risk Factors:      Prior CVA. Comparison Study:  no prior Performing Technologist: Blanch Media RVS  Examination Guidelines: A complete evaluation includes B-mode imaging, spectral Doppler, color Doppler, and power Doppler as needed of all accessible portions of each vessel. Bilateral testing is considered an integral part of a complete examination. Limited examinations for reoccurring indications may be performed as noted.  Right Carotid Findings: +----------+--------+--------+--------+------------------+--------+           PSV cm/sEDV cm/sStenosisPlaque DescriptionComments +----------+--------+--------+--------+------------------+--------+ CCA Prox  75      7               heterogenous               +----------+--------+--------+--------+------------------+--------+ CCA Distal57      13              heterogenous               +----------+--------+--------+--------+------------------+--------+ ICA Prox  85       36      1-39%   heterogenous      tortuous +----------+--------+--------+--------+------------------+--------+ ICA Distal132     60  tortuous +----------+--------+--------+--------+------------------+--------+ ECA       81      14                                         +----------+--------+--------+--------+------------------+--------+ +----------+--------+-------+--------+-------------------+           PSV cm/sEDV cmsDescribeArm Pressure (mmHG) +----------+--------+-------+--------+-------------------+ [id number]      17                                 +----------+--------+-------+--------+-------------------+ +---------+--------+--+--------+--+---------+ VertebralPSV cm/s60EDV cm/s24Antegrade +---------+--------+--+--------+--+---------+  Left Carotid Findings: +----------+--------+--------+--------+------------------+--------+           PSV cm/sEDV cm/sStenosisPlaque DescriptionComments +----------+--------+--------+--------+------------------+--------+ CCA Prox  77      17              heterogenous               +----------+--------+--------+--------+------------------+--------+ CCA Distal71      18              heterogenous               +----------+--------+--------+--------+------------------+--------+ ICA Prox  59      21      1-39%   heterogenous               +----------+--------+--------+--------+------------------+--------+ ICA Distal66      24                                         +----------+--------+--------+--------+------------------+--------+ ECA       51      10                                         +----------+--------+--------+--------+------------------+--------+ +----------+--------+--------+--------+-------------------+           PSV cm/sEDV cm/sDescribeArm Pressure (mmHG) +----------+--------+--------+--------+-------------------+ [id number]                                           +----------+--------+--------+--------+-------------------+ +---------+--------+--+--------+--+---------+ VertebralPSV cm/s73EDV cm/s13Antegrade +---------+--------+--+--------+--+---------+   Summary: Right Carotid: Velocities in the right ICA are consistent with a 1-39% stenosis. Left Carotid: Velocities in the left ICA are consistent with a 1-39% stenosis. Vertebrals: Bilateral vertebral arteries demonstrate antegrade flow. *See table(s) above for measurements and observations.     Preliminary    VAS [place] LOWER EXTREMITY VENOUS (DVT)  Result Date: 01/08/2020  Lower Venous DVTStudy Indications: Stroke.  Comparison Study: No prior study Performing Technologist: Gertie Fey MHA, RDMS, RVT, RDCS  Examination Guidelines: A complete evaluation includes B-mode imaging, spectral Doppler, color Doppler, and power Doppler as needed of all accessible portions of each vessel. Bilateral testing is considered an integral part of a complete examination. Limited examinations for reoccurring indications may be performed as noted. The reflux portion of the exam is performed with the patient in reverse Trendelenburg.  +---------+---------------+---------+-----------+----------+--------------+ RIGHT    CompressibilityPhasicitySpontaneityPropertiesThrombus Aging +---------+---------------+---------+-----------+----------+--------------+ CFV      Full           Yes      Yes                                 +---------+---------------+---------+-----------+----------+--------------+  SFJ      Full                                                        +---------+---------------+---------+-----------+----------+--------------+ FV Prox  Full                                                        +---------+---------------+---------+-----------+----------+--------------+ FV Mid   Full                                                         +---------+---------------+---------+-----------+----------+--------------+ FV DistalFull                                                        +---------+---------------+---------+-----------+----------+--------------+ PFV      Full                                                        +---------+---------------+---------+-----------+----------+--------------+ POP      Full           Yes      Yes                                 +---------+---------------+---------+-----------+----------+--------------+ PTV      Full                                                        +---------+---------------+---------+-----------+----------+--------------+ PERO     Full                                                        +---------+---------------+---------+-----------+----------+--------------+   +---------+---------------+---------+-----------+----------+--------------+ LEFT     CompressibilityPhasicitySpontaneityPropertiesThrombus Aging +---------+---------------+---------+-----------+----------+--------------+ CFV      Full           Yes      Yes                                 +---------+---------------+---------+-----------+----------+--------------+ SFJ      Full                                                        +---------+---------------+---------+-----------+----------+--------------+  FV Prox  Full                                                        +---------+---------------+---------+-----------+----------+--------------+ FV Mid   Full                                                        +---------+---------------+---------+-----------+----------+--------------+ FV DistalFull                                                        +---------+---------------+---------+-----------+----------+--------------+ PFV      Full                                                         +---------+---------------+---------+-----------+----------+--------------+ POP      Full           Yes      Yes                                 +---------+---------------+---------+-----------+----------+--------------+ PTV      Full                                                        +---------+---------------+---------+-----------+----------+--------------+ PERO     Full                                                        +---------+---------------+---------+-----------+----------+--------------+     Summary: RIGHT: - There is no evidence of deep vein thrombosis in the lower extremity.  - No cystic structure found in the popliteal fossa.  LEFT: - There is no evidence of deep vein thrombosis in the lower extremity.  - No cystic structure found in the popliteal fossa.  *See table(s) above for measurements and observations. Electronically signed by Coral Else MD on 01/08/2020 at 5:49:38 PM.    Final     Assessment/Plan: Diagnosis: Right MCA infarct with hemorrhagic transformation Stroke: Continue secondary stroke prophylaxis and Risk Factor Modification listed below:   Antiplatelet therapy: On hold Blood Pressure Management:  Continue current medication with prn's with permisive HTN per primary team Statin Agent:   Tobacco abuse: Counsel Left sided hemiparesis: fit for orthosis to prevent contractures (resting hand splint for day, wrist cock up splint at night, PRAFO, etc) PT/OT for mobility, ADL training  Motor recovery: Fluoxetine Labs and images (see above) independently reviewed.  Records reviewed and summated above.  1. Does  the need for close, 24 hr/day medical supervision in concert with the patient's rehab needs make it unreasonable for this patient to be served in a less intensive setting? Yes 2. Co-Morbidities requiring supervision/potential complications: alcohol/tobacco/marijuana (counseled on appropriate), chronic back pain, hypokalemia (continue to monitor  and replete as necessary), post stroke dysphagia (advance diet as tolerated) 3. Due to bladder management, bowel management, safety, disease management, medication administration and patient education, does the patient require 24 hr/day rehab nursing? Yes 4. Does the patient require coordinated care of a physician, rehab nurse, therapy disciplines of PT/OT/SLP to address physical and functional deficits in the context of the above medical diagnosis(es)? Yes Addressing deficits in the following areas: balance, endurance, locomotion, strength, transferring, bathing, dressing, toileting, cognition, speech, swallowing and psychosocial support 5. Can the patient actively participate in an intensive therapy program of at least 3 hrs of therapy per day at least 5 days per week? Potentially 6. The potential for patient to make measurable gains while on inpatient rehab is excellent 7. Anticipated functional outcomes upon discharge from inpatient rehab are min assist  with PT, min assist with OT, supervision with SLP. 8. Estimated rehab length of stay to reach the above functional goals is: 17-20 days. 9. Anticipated discharge destination: Home 10. Overall Rehab/Functional Prognosis: good  RECOMMENDATIONS: This patient's condition is appropriate for continued rehabilitative care in the following setting: CIR caregiver support available upon discharge. Patient has agreed to participate in recommended program. Potentially Note that insurance prior authorization may be required for reimbursement for recommended care.  Comment: Rehab Admissions Coordinator to follow up.  I have personally performed a face to face diagnostic evaluation, including, but not limited to relevant history and physical exam findings, of this patient and developed relevant assessment and plan.  Additionally, I have reviewed and concur with the physician assistant's documentation above.   Maryla Morrow, MD, ABPMR Mcarthur Rossetti Angiulli,  PA-C 01/09/2020

## 2020-01-10 ENCOUNTER — Inpatient Hospital Stay (HOSPITAL_COMMUNITY): Payer: BC Managed Care – PPO | Admitting: Anesthesiology

## 2020-01-10 ENCOUNTER — Inpatient Hospital Stay (HOSPITAL_COMMUNITY): Payer: BC Managed Care – PPO

## 2020-01-10 ENCOUNTER — Encounter (HOSPITAL_COMMUNITY)
Admission: EM | Disposition: A | Discharge status: Rehabilitation Facility | Payer: Self-pay | Source: Home / Self Care | Attending: Internal Medicine

## 2020-01-10 ENCOUNTER — Encounter (HOSPITAL_COMMUNITY): Payer: Self-pay | Admitting: Internal Medicine

## 2020-01-10 DIAGNOSIS — I639 Cerebral infarction, unspecified: Secondary | ICD-10-CM

## 2020-01-10 HISTORY — PX: BUBBLE STUDY: SHX6837

## 2020-01-10 HISTORY — PX: TEE WITHOUT CARDIOVERSION: SHX5443

## 2020-01-10 LAB — COMPREHENSIVE METABOLIC PANEL
ALT: 33 U/L (ref 0–44)
AST: 43 U/L — ABNORMAL HIGH (ref 15–41)
Albumin: 3.2 g/dL — ABNORMAL LOW (ref 3.5–5.0)
Alkaline Phosphatase: 64 U/L (ref 38–126)
Anion gap: 8 (ref 5–15)
BUN: 10 mg/dL (ref 6–20)
CO2: 22 mmol/L (ref 22–32)
Calcium: 8.8 mg/dL — ABNORMAL LOW (ref 8.9–10.3)
Chloride: 108 mmol/L (ref 98–111)
Creatinine, Ser: 0.92 mg/dL (ref 0.61–1.24)
GFR calc Af Amer: >60 mL/min (ref >60)
GFR calc non Af Amer: >60 mL/min (ref >60)
Glucose, Bld: 101 mg/dL — ABNORMAL HIGH (ref 70–99)
Potassium: 3.9 mmol/L (ref 3.5–5.1)
Sodium: 138 mmol/L (ref 135–145)
Total Bilirubin: 0.5 mg/dL (ref 0.3–1.2)
Total Protein: 6.4 g/dL — ABNORMAL LOW (ref 6.5–8.1)

## 2020-01-10 LAB — ANCA TITERS
Atypical P-ANCA titer: <1:20 {titer}
C-ANCA: <1:20 {titer}
P-ANCA: <1:20 {titer}

## 2020-01-10 LAB — CBC WITH DIFFERENTIAL/PLATELET
Abs Immature Granulocytes: 0.05 10*3/uL (ref 0.00–0.07)
Basophils Absolute: 0.1 10*3/uL (ref 0.0–0.1)
Basophils Relative: 1 %
Eosinophils Absolute: 0.1 10*3/uL (ref 0.0–0.5)
Eosinophils Relative: 1 %
HCT: 41 % (ref 39.0–52.0)
Hemoglobin: 13.8 g/dL (ref 13.0–17.0)
Immature Granulocytes: 1 %
Lymphocytes Relative: 21 %
Lymphs Abs: 1.9 10*3/uL (ref 0.7–4.0)
MCH: 29.6 pg (ref 26.0–34.0)
MCHC: 33.7 g/dL (ref 30.0–36.0)
MCV: 87.8 fL (ref 80.0–100.0)
Monocytes Absolute: 1 10*3/uL (ref 0.1–1.0)
Monocytes Relative: 11 %
Neutro Abs: 5.9 10*3/uL (ref 1.7–7.7)
Neutrophils Relative %: 65 %
Platelets: 233 10*3/uL (ref 150–400)
RBC: 4.67 MIL/uL (ref 4.22–5.81)
RDW: 17.3 % — ABNORMAL HIGH (ref 11.5–15.5)
WBC: 9 10*3/uL (ref 4.0–10.5)
nRBC: 0 % (ref 0.0–0.2)

## 2020-01-10 LAB — PHOSPHORUS: Phosphorus: 3.6 mg/dL (ref 2.5–4.6)

## 2020-01-10 LAB — MAGNESIUM: Magnesium: 2.1 mg/dL (ref 1.7–2.4)

## 2020-01-10 SURGERY — ECHOCARDIOGRAM, TRANSESOPHAGEAL
Anesthesia: Monitor Anesthesia Care

## 2020-01-10 MED ORDER — PROPOFOL 10 MG/ML IV BOLUS
INTRAVENOUS | Status: DC | PRN
  Administered 2020-01-10: 25 mg via INTRAVENOUS

## 2020-01-10 MED ORDER — LIDOCAINE VISCOUS HCL 2 % MT SOLN
OROMUCOSAL | Status: DC | PRN
  Administered 2020-01-10: 1 via OROMUCOSAL

## 2020-01-10 MED ORDER — SODIUM CHLORIDE 0.9 % IV SOLN: INTRAVENOUS | Status: DC

## 2020-01-10 MED ORDER — PROPOFOL 500 MG/50ML IV EMUL
INTRAVENOUS | Status: DC | PRN
  Administered 2020-01-10: 150 ug/kg/min via INTRAVENOUS

## 2020-01-10 MED ORDER — LIDOCAINE VISCOUS HCL 2 % MT SOLN
OROMUCOSAL | Status: AC
  Filled 2020-01-10: qty 15

## 2020-01-10 MED ORDER — ENOXAPARIN SODIUM 40 MG/0.4ML ~~LOC~~ SOLN
40.0000 mg | SUBCUTANEOUS | Status: DC
  Administered 2020-01-10 – 2020-01-14 (×5): 40 mg via SUBCUTANEOUS
  Filled 2020-01-10 (×5): qty 0.4

## 2020-01-10 NOTE — Progress Notes (Signed)
  Speech Language Pathology Treatment: Dysphagia;Cognitive-Linquistic  Patient Details Name: Derek [name] MRN: [id number] DOB: 08-12-1976 Today's Date: 01/10/2020 Time: 1100-1120 SLP Time Calculation (min) (ACUTE ONLY): 20 min  Assessment / Plan / Recommendation Clinical Impression  Pt seen after TEE this am, hopeful for diet upgrade to thin liquids. RN reports good tolerance of nectar. First attempted 3 oz water swallow with immediate cough. Branched to single sips, still cough, then single sips with a chin tuck, again coughing. Pt explains that they numbed his throat for his TEE and it still feels funny, which is actually insightful and may be playing a role in function today. Offered further interventions to encourage compensatory strategies with solids, pt return demonstrated placing bolus on right, containing on right and performing a lingual sweep. Will resume a dys 3 (mech soft diet), though still with nectar thick liquids. Advise MBS in the near future, though suspect nectar thick liquids would still be warranted if completed today given presentation.  Pt observed with relatively clear articulation today but flat affect, conversation with brother on the phone was appropriate with min cues needed for pt to initiate and sustain attention. Pt also required moderate visual cues for improved awareness of deficits and functional errors.   HPI HPI:  Derek [name] is a 44 y.o. male with no known medical history presenting as Code Stroke.  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 There is hemorrhagic transformation of the right basal ganglia      SLP Plan  Continue with current plan of care       Recommendations  Diet recommendations: Dysphagia 3 (mechanical soft);Nectar-thick liquid Liquids provided via: Cup;Straw Medication Administration: Whole meds with puree Supervision: Staff to assist with self feeding Compensations: Slow rate;Small  sips/bites;Lingual sweep for clearance of pocketing                Follow up Recommendations: Inpatient Rehab SLP Visit Diagnosis: Cognitive communication deficit (R41.841);Dysphagia, unspecified (R13.10) Plan: Continue with current plan of care       GO              Harlon Ditty, MA CCC-SLP  Acute Rehabilitation Services Pager [phone] Office [phone]   Claudine Mouton 01/10/2020, 11:57 AM

## 2020-01-10 NOTE — Progress Notes (Signed)
STROKE TEAM PROGRESS NOTE   INTERVAL HISTORY No family at bedside.  RN at bedside as patient fell from chair when trying to getting up by his own.  He has had a wound right frontal area, no skin breakdown, no bleeding.  Stat CT stable, no bleeding, improving midline shift.  TEE today unremarkable, no PFO.  Still has left hemiplegia and left facial droop.  Vitals:   01/10/20 0930 01/10/20 0940 01/10/20 0947 01/10/20 1040  BP:  132/85 (!) 141/84 130/78  Pulse: 82 75 69 64  Resp: (!) 22 (!) 26 (!) 22 13  Temp:      TempSrc:      SpO2: 98% 97% 98%   Weight:      Height:        CBC:  Recent Labs  Lab 01/07/20 0703 01/07/20 0703 01/09/20 0429 01/10/20 0517  WBC 14.3*   < > 9.2 9.0  NEUTROABS 12.6*  --   --  5.9  HGB 14.8   < > 14.2 13.8  HCT 44.7   < > 42.4 41.0  MCV 88.9   < > 88.1 87.8  PLT 314   < > 256 233   < > = values in this interval not displayed.    Basic Metabolic Panel:  Recent Labs  Lab 01/07/20 0703 01/07/20 1644 01/09/20 0429 01/10/20 0517  NA   < >  --  140 138  K   < >  --  3.4* 3.9  CL   < >  --  107 108  CO2   < >  --  24 22  GLUCOSE   < >  --  98 101*  BUN   < >  --  9 10  CREATININE   < >  --  0.88 0.92  CALCIUM   < >  --  8.9 8.8*  MG  --  2.1  --  2.1  PHOS  --  3.3  --  3.6   < > = values in this interval not displayed.   Lipid Panel:     Component Value Date/Time   CHOL 204 (H) 01/08/2020 0450   TRIG 94 01/08/2020 0450   HDL 47 01/08/2020 0450   CHOLHDL 4.3 01/08/2020 0450   VLDL 19 01/08/2020 0450   LDLCALC 138 (H) 01/08/2020 0450   HgbA1c:  Lab Results  Component Value Date   HGBA1C 5.6 01/08/2020   Urine Drug Screen:     Component Value Date/Time   LABOPIA NONE DETECTED 01/07/2020 1434   COCAINSCRNUR NONE DETECTED 01/07/2020 1434   LABBENZ NONE DETECTED 01/07/2020 1434   AMPHETMU NONE DETECTED 01/07/2020 1434   THCU POSITIVE (A) 01/07/2020 1434   LABBARB NONE DETECTED 01/07/2020 1434    Alcohol Level No results found  for: [facility]  IMAGING past 24 hours ECHO TEE  Result Date: 01/10/2020    TRANSESOPHOGEAL ECHO REPORT   Patient Name:   Alvira Philips Date of Exam: 01/10/2020 Medical Rec #:  [id number]        Height:       74.0 in Accession #:    [id number]       Weight:       220.0 lb Date of Birth:  December 21, 1975         BSA:          2.263 m Patient Age:    44 years         BP:  125/82 mmHg Patient Gender: M                HR:           82 bpm. Exam Location:  Inpatient Procedure: 2D Echo Indications:    stroke 434.91  History:        Patient has prior history of Echocardiogram examinations, most                 recent 01/08/2020. Stroke; Risk Factors:Current Smoker. ETOH &                 Tobacco use.  Sonographer:    [name] RDCS (AE) Referring Phys: [id number] CHRISTOPHER RONALD BERGE PROCEDURE: After discussion of the risks and benefits of a TEE, an informed consent was obtained from the patient. TEE procedure time was 15 minutes. The transesophogeal probe was passed without difficulty through the esophogus of the patient. Imaged were obtained with the patient in a left lateral decubitus position. Local oropharyngeal anesthetic was provided with viscous lidocaine. Sedation performed by different physician. The patient was monitored while under deep sedation. Anesthestetic sedation was provided intravenously by Anesthesiology: 250mg  of Propofol. Image quality was excellent. The patient's vital signs; including heart rate, blood pressure, and oxygen saturation; remained stable throughout the procedure. The patient developed  no complications during the procedure. IMPRESSIONS  1. Left ventricular ejection fraction, by estimation, is 60 to 65%. The left ventricle has normal function. The left ventricle has no regional wall motion abnormalities.  2. Right ventricular systolic function is normal. The right ventricular size is normal.  3. No left atrial/left atrial appendage thrombus was detected. The LAA emptying velocity was  87 cm/s.  4. Redundant chordal tissue present. The mitral valve is normal in structure. Trivial mitral valve regurgitation. No evidence of mitral stenosis.  5. The aortic valve is tricuspid. Aortic valve regurgitation is not visualized. No aortic stenosis is present.  6. Agitated saline contrast bubble study was negative, with no evidence of any interatrial shunt. Conclusion(s)/Recommendation(s): Normal biventricular function without evidence of hemodynamically significant valvular heart disease. No LA/LAA thrombus identified. Negative bubble study for interatrial shunt. No intracardiac source of embolism detected  on this on this transesophageal echocardiogram. FINDINGS  Left Ventricle: Left ventricular ejection fraction, by estimation, is 60 to 65%. The left ventricle has normal function. The left ventricle has no regional wall motion abnormalities. The left ventricular internal cavity size was normal in size. There is  no left ventricular hypertrophy. Right Ventricle: The right ventricular size is normal. No increase in right ventricular wall thickness. Right ventricular systolic function is normal. Left Atrium: Left atrial size was normal in size. No left atrial/left atrial appendage thrombus was detected. The LAA emptying velocity was 87 cm/s. Right Atrium: Right atrial size was normal in size. Pericardium: Trivial pericardial effusion is present. The pericardial effusion is circumferential. Mitral Valve: Redundant chordal tissue present. The mitral valve is normal in structure. Trivial mitral valve regurgitation. No evidence of mitral valve stenosis. Tricuspid Valve: The tricuspid valve is normal in structure. Tricuspid valve regurgitation is trivial. No evidence of tricuspid stenosis. Aortic Valve: The aortic valve is tricuspid. Aortic valve regurgitation is not visualized. No aortic stenosis is present. Pulmonic Valve: The pulmonic valve was normal in structure. Pulmonic valve regurgitation is trivial. No  evidence of pulmonic stenosis. Aorta: The aortic root, ascending aorta and aortic arch are all structurally normal, with no evidence of dilitation or obstruction. Venous: The right upper pulmonary vein and  left upper pulmonary vein are normal. IAS/Shunts: No atrial level shunt detected by color flow Doppler. Agitated saline contrast was given intravenously to evaluate for intracardiac shunting. Agitated saline contrast bubble study was negative, with no evidence of any interatrial shunt. EKG: Rhythm strip during this exam demostrated normal sinus rhythm.   AORTA Ao Root diam: 3.60 cm Ao Asc diam:  3.00 cm TRICUSPID VALVE TR Peak grad:   9.0 mmHg TR Vmax:        150.00 cm/s Lennie Odor MD Electronically signed by Lennie Odor MD Signature Date/Time: 01/10/2020/10:16:22 AM    Final     PHYSICAL EXAM   Temp:  [98.5 F (36.9 C)-99.6 F (37.6 C)] 99.5 F (37.5 C) (03/11 0926) Pulse Rate:  [60-83] 64 (03/11 1040) Resp:  [8-34] 13 (03/11 1040) BP: (108-141)/(70-94) 130/78 (03/11 1040) SpO2:  [96 %-100 %] 98 % (03/11 0947)  General - Well nourished, well developed, in no apparent distress.  Ophthalmologic - fundi not visualized due to noncooperation.  Cardiovascular - Regular rhythm and rate.  Neuro - awake alert and orientated x 4. PERRL, EOMI. Visual field full. Significant left facial droop, tongue protrusion to the left. RUE and RLE strong at least 4/5, LUE 0/5 and LLE 1/5 with pain. DTR diminished on the left and negative babinski. Sensation symmetrical, coordination intact right finger-to-nose and gait not able to be tested.   ASSESSMENT/PLAN Mr. RIEN MARLAND is a 44 y.o. male with history of tobacco, marijuana and alcohol use with limited healthcare presenting following frequent falls over the weekend with L sided hemiparesis, R gaze deviation, HA and incontinence.   Stroke:   R MCA infarct w/ hemorrhagic transformation, infarct embolic pattern, secondary to unknown source  CT head  recent R MCA M1 infarct w/ mass effect and petechial hemorrhage in R basal ganglia. Infarct extends to R frontal and R parietal lobes. Likely thrombus R M1.  MRI  R MCA infarct R basal ganglia, R parietal lobe. Hemorrhagic transformation in R basal ganglia w/ mass effect on lateral ventricle.   MRA  R M1 patent. Moderate R M2 stenosis.  CTA neck unremarkable  CT repeated 3/10 evolving right MCA infarct with hemorrhagic transformation, 3 mm right-to-left shift  2D Echo EF 60-65%. No source of embolus   LE doppler no DVT  TEE no PFO, no SOE  Recommend 30-day CardioNet monitor as outpatient to rule out A. fib  Hypercoagulable labs neg so far   LDL 138  HgbA1c 5.6  Lovenox for VTE prophylaxis as HT stable now  No antithrombotic prior to admission, now on aspirin 325 mg daily. Hold DAPT at this time given hemorrhagic transformation   Therapy recommendations:  CIR  Disposition:  pending   Follow-up Stroke Clinic at [facility] in 4 weeks after d/c from rehab. Office will call with appointment date and time. Order placed.  Cerebral edema  CT showed right MCA infarct with mass effect  MRI right MCA infarct with mass effect on lateral ventricle with minimal MLS  Repeat CT 3/10 evolving right MCA infarct with hemorrhagic conversion, 3 mm MLS  CT repeat 3/11 evolving right MCA infarct with HT, improving MLS, no hydrocephalus  No 3% saline needed at this time  Neuro stable, awake alert, orientated  Hypertension  BP elevated up to 189/106 . BP goal < 160 given hemorrhagic conversion . Added norvasc 10 . BP stable 130-140s . Long-term BP goal normotensive  Hyperlipidemia  Home meds:  No statin  Now on  lipitor 40  LDL 138, goal < 70  Continue statin at discharge  SIRS - Fever and leukocytosis, resolved  Leukocytosis 14.3->9.2->9.0  TM 100.5 -> afebrile  UCx no growth  UA neg  B Cx no growth to date   Dysphagia . Secondary to  stroke . On D3 nectar thick liquids . Post barium study . Speech on board  Tobacco abuse  Current smoker  Smoking cessation counseling will be provided  Nicotine patch provided  Alcohol abuse  On CIWA protocol  MVI/B1/FA  Seizure precaution  Other Stroke Risk Factors  Substance abuse - Marijuana dependence. UDS - THC POSITIVE, cessation education will be provided  Overweight, Body mass index is 28.25 kg/m., recommend weight loss, diet and exercise as appropriate   Other Active Problems  Hypokalemia  Fall from chair 3/11 - hit right forehead - CT head stable  Hospital day # 3  Neurology will sign off. Please call with questions. Pt will follow up with stroke clinic NP at [facility] in about 4 weeks. Thanks for the consult.   Marvel Plan, MD PhD Stroke Neurology 01/10/2020 11:24 AM    To contact Stroke Continuity provider, please refer to [email or web address]. After hours, contact General Neurology

## 2020-01-10 NOTE — Interval H&P Note (Signed)
History and Physical Interval Note:  01/10/2020 8:40 AM  Basilia Jumbo Timmons  has presented today for surgery, with the diagnosis of STROKE.  The various methods of treatment have been discussed with the patient and family. After consideration of risks, benefits and other options for treatment, the patient has consented to  Procedure(s): TRANSESOPHAGEAL ECHOCARDIOGRAM (TEE) (N/A) as a surgical intervention.  The patient's history has been reviewed, patient examined, no change in status, stable for surgery.  I have reviewed the patient's chart and labs.  Questions were answered to the patient's satisfaction.     TEE for stroke evaluation with bubble study. NPO since midnight. No issues with swallowing/GI.   Gerri Spore T. Flora Lipps, MD [facility]  [address] [place], [place] [address] [phone]  8:40 AM

## 2020-01-10 NOTE — Progress Notes (Signed)
PT chair alarmed. This nurse went to room and noticed pt on floor. Fall was unwitnessed. Pt stated he was attempting to get to the bathroom. Call bell was in pt reach but pt failed to use it. Pt also had condom cath on. Pt states he hit his head on the floor. Pt placed in bed with maxi lift and VS taken/stable. Roda Shutters, MD notified and came to bedside. Stat head Ct ordered for pt

## 2020-01-10 NOTE — Progress Notes (Signed)
Occupational Therapy Treatment Patient Details Name: Derek [name] MRN: [id number] DOB: 13-Aug-1976 Today's Date: 01/10/2020    History of present illness 44 yo male no known medical history presenting as Code Stroke.  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 Pt was agreeable to Co-tx with PTA this session to address goals. Pt needing max cuing this session to attend to L UE during functional transfers. Pt standing in stedy this session with use of mirror for visual feedback regarding midline orientation. Pt needing mod - max cuing for positioning. Pt standing in stedy with min of 1 for balance and PTA addressing L knee buckle for several minutes. While standing pt initiating weight shift with min - mod A for balance. Pt needing hand over hand assistance to utilize L UE into functional grooming tasks at sink. Pt positioned in recliner chair with use of stedy at end of session. All needs within reach and L UE positioned to protect hemiplegic side. Pt continues to benefit from OT intervention and remains good candidate for CIR.    Follow Up Recommendations  CIR    Equipment Recommendations  Other (comment)(defer to next venue of care)       Precautions / Restrictions Precautions Precautions: Fall Precaution Comments: L neglect Restrictions Weight Bearing Restrictions: No       Mobility Bed Mobility Overal bed mobility: Needs Assistance Bed Mobility: Supine to Sit     Supine to sit: +2 for safety/equipment;Min assist     General bed mobility comments: MinA to bring LLE over to edge of bed and pt pulling trunk up to sitting with RUE with bed rail  Transfers Overall transfer level: Needs assistance Equipment used: Ambulation equipment used Transfers:  Sit to/from Stand Sit to Stand: +2 safety/equipment;+2 physical assistance;Min assist         General transfer comment: MinA +2 to pull up to sitting with RUE, good initiation and hip extension activation    Balance Overall balance assessment: Needs assistance Sitting-balance support: Single extremity supported;Feet supported Sitting balance-Leahy Scale: Fair     Standing balance support: Single extremity supported;During functional activity Standing balance-Leahy Scale: Poor        ADL either performed or assessed with clinical judgement   ADL       Grooming: Wash/dry hands;Wash/dry face;Cueing for safety;Cueing for sequencing      General ADL Comments: Pt sitting in stedy at sink with use of mirror for visual feedback related to midline orientation. Hand over hand assist to utilize L UE in self care tasks and mod  cuing for pt to locate L UE to wash hands at sink.               Cognition Arousal/Alertness: Awake/alert Behavior During Therapy: Impulsive;Flat affect Overall Cognitive Status: Impaired/Different from baseline Area of Impairment: Awareness;Safety/judgement      Safety/Judgement: Decreased awareness of safety;Decreased awareness of deficits Awareness: Intellectual   General Comments: Pt follows 1 step commands. Pt attempting to pull self up by rail to get EOB and asking if he could pull out IV's during session.                   Pertinent Vitals/ Pain       Pain Assessment: No/denies pain  Frequency  Min 3X/week        Progress Toward Goals  OT Goals(current goals can now be found in the care plan section)  Progress towards OT goals: Progressing toward goals  Acute Rehab OT Goals Patient Stated Goal: get to rehab OT Goal Formulation: With patient Time For Goal Achievement: 01/24/20 Potential to Achieve Goals: Good  Plan Discharge plan remains appropriate    Co-evaluation    PT/OT/SLP Co-Evaluation/Treatment:  Yes Reason for Co-Treatment: Necessary to address cognition/behavior during functional activity;For patient/therapist safety;To address functional/ADL transfers PT goals addressed during session: Mobility/safety with mobility OT goals addressed during session: ADL's and self-care;Strengthening/ROM      AM-PAC OT "6 Clicks" Daily Activity     Outcome Measure   Help from another person eating meals?: A Little Help from another person taking care of personal grooming?: A Little Help from another person toileting, which includes using toliet, bedpan, or urinal?: A Lot Help from another person bathing (including washing, rinsing, drying)?: A Lot Help from another person to put on and taking off regular upper body clothing?: A Lot Help from another person to put on and taking off regular lower body clothing?: Total 6 Click Score: 13    End of Session Equipment Utilized During Treatment: Gait belt  OT Visit Diagnosis: Unsteadiness on feet (R26.81);Hemiplegia and hemiparesis Hemiplegia - Right/Left: Left Hemiplegia - dominant/non-dominant: Non-Dominant   Activity Tolerance Patient tolerated treatment well   Patient Left in chair;with call bell/phone within reach;with chair alarm set   Nurse Communication Mobility status;Precautions        Time: 7782-4235 OT Time Calculation (min): 25 min  Charges: OT General Charges $OT Visit: 1 Visit OT Treatments $Self Care/Home Management : 8-22 mins   Jackquline Denmark P MS, OTR/L 01/10/2020, 2:06 PM

## 2020-01-10 NOTE — Progress Notes (Signed)
Message sent to physician regarding patient fall and orders.

## 2020-01-10 NOTE — H&P (Signed)
Physical Medicine and Rehabilitation Admission H&P     HPI: Derek [name] is a 44 year old right-handed male with history of alcohol/tobacco/marijuana use as well as chronic back pain.  History taken from chart review.  Patient lives with girlfriend and independent prior to admission.  He is employed as a Agricultural consultant.  Presented 01/07/2020 with left hemiparesis.  Blood pressure 184/110.  Cranial CT scan showed right MCA infarction with mass-effect possible petechial hemorrhage in portions of the right basal ganglia.  Infarcts extending to the posterior superior right frontal and adjacent right parietal lobe.  Suspect thrombus/embolus in the M1 segment of right middle cerebral artery.  MRI showed acute right MCA infarction involving basal ganglia and right parietal lobe.  Hemorrhagic transformation of the right basal ganglia infarction with mass-effect on the lateral ventricle.  Echocardiogram with ejection fraction 65%.  Lower extremity Dopplers negative for DVT.  Admission chemistries unremarkable except glucose 134, WBC 14,300, CK 787, blood cultures no growth to date, lactic acid within normal limits 1.9, urine culture no growth, urine drug screen positive marijuana.  Neurology consulted with TEE completed 01/10/2020 showing no PFO /Thrombus. and currently maintained on aspirin 325 mg daily for CVA prophylaxis.  Hold on DAPT at this time given hemorrhagic transformation.  Follow-up repeat cranial CT scan 01/09/2020 evolving right MCA infarction again with hemorrhagic transformation, 3 mm right to left shift.  No plan for 3% saline at this time and scan was again completed 01/10/2020 showing no new hemorrhage hydrocephalus or worsening mass-effect.  Lovenox was initiated for DVT prophylaxis 01/10/2020.  Patient did have a low-grade fever 100.5 leukocytosis 14,300 improved to 7.0.  Urine culture no growth urinalysis negative blood cultures no growth to date.  Therapy evaluations completed and  patient was admitted for a comprehensive rehab program  Pt reports L face feels swollen/tingling;  LBM 2 days ago- denies constipation- voiding well.    Review of Systems  Constitutional: Positive for fever. Negative for chills.  HENT: Negative for hearing loss.   Eyes: Negative for blurred vision and double vision.  Respiratory: Negative for cough and shortness of breath.   Cardiovascular: Negative for chest pain, palpitations and leg swelling.  Gastrointestinal: Positive for constipation. Negative for heartburn, nausea and vomiting.  Genitourinary: Negative for dysuria, flank pain and hematuria.  Musculoskeletal: Positive for back pain.  Skin: Negative for rash.  Neurological: Positive for weakness.       Occasional headaches  All other systems reviewed and are negative.  Past Medical History:  Diagnosis Date  . 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 Alcohol dependence (HCC)   . Back pain   . Marijuana dependence (HCC)   . Tobacco dependence    History reviewed. No pertinent surgical history. Family History  Problem Relation Age of Onset  . Stroke Neg Hx   . CAD Neg Hx    Social History:  reports that he has been smoking cigarettes. He has been smoking about 0.20 packs per day. He has never used smokeless tobacco. He reports current alcohol use. He reports current drug use. Drug: Marijuana. Allergies: No Known Allergies Medications Prior to Admission  Medication Sig Dispense Refill  . ferrous sulfate 325 (65 FE) MG tablet Take 325 mg by mouth 2 (two) times daily.  2  . ibuprofen (ADVIL,MOTRIN) 200 MG tablet Take 400 mg by mouth every 6 (six) hours as needed. For pain      Drug Regimen Review Drug regimen was reviewed and  remains appropriate with no significant issues identified  Home: Home Living Family/patient expects to be discharged to:: Inpatient rehab  Lives With: Significant other, Other (Comment)(children)   Functional History: Prior  Function Level of Independence: Independent 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[employer]  Functional Status:  Mobility: Bed Mobility Overal bed mobility: Needs Assistance Bed Mobility: Supine to Sit Supine to sit: +2 for safety/equipment, Min assist General bed mobility comments: MinA to bring LLE over to edge of bed, PT guarding LUE, pt pulling trunk up to sitting with RUE with bed rail Transfers Overall transfer level: Needs assistance Equipment used: Ambulation equipment used Transfer via Lift Equipment: Stedy Transfers: Sit to/from [employer] to Stand: Min assist, +2 safety/equipment, +2 physical assistance Stand pivot transfers: +2 physical assistance, Max assist, From elevated surface General transfer comment: MinA +2 to pull up to sitting with RUE, good initiation and hip extension activation Ambulation/Gait General Gait Details: unable    ADL: ADL Overall ADL's : Needs assistance/impaired Eating/Feeding: NPO Grooming: Wash/dry hands, Minimal assistance, Bed level Grooming Details (indicate cue type and reason): pt terminates task prematurely. pt leaving wash cloth over eyes. pt states "okay" when asked to remove. Pt hears phone ring and spontaneously pulls wrap off face Upper Body Bathing: Maximal assistance Lower Body Bathing: Total assistance Upper Body Dressing : Maximal assistance Lower Body Dressing: Total assistance Toilet Transfer: +2 for physical assistance, Maximal assistance, +2 for safety/equipment General ADL Comments: pt with L lateral lean and lacks awarness to L deficits  Cognition: Cognition Overall Cognitive Status: Impaired/Different from baseline Arousal/Alertness: Lethargic Orientation Level: Oriented X4 Attention: Sustained Sustained Attention: Appears intact Memory: Impaired Memory Impairment: Decreased short term memory Decreased Short Term Memory: Verbal basic Immediate Memory Recall: Sock, Blue, Bed Memory Recall Sock: Without Cue Memory Recall  Blue: With Cue Memory Recall Bed: With Cue Awareness: Appears intact Problem Solving: Impaired Problem Solving Impairment: Verbal complex Safety/Judgment: Appears intact Cognition Arousal/Alertness: Lethargic Behavior During Therapy: Flat affect Overall Cognitive Status: Impaired/Different from baseline Area of Impairment: Awareness, Safety/judgement Safety/Judgement: Decreased awareness of safety, Decreased awareness of deficits Awareness: Emergent General Comments: Pt A&Ox4 today, still mildly drowsy, but able to follow all 1 step commands. Emerging awareness of deficits, asking, "how did this happen to someone my age?"  Difficult to assess due to: Level of arousal  Physical Exam: Blood pressure 119/71, pulse 69, temperature 98.7 F (37.1 C), temperature source Oral, resp. rate (!) 21, height 6\' 2"  (1.88 m), weight 99.8 kg, SpO2 99 %. Physical Exam  Nursing note and vitals reviewed. Constitutional:  Awake, alert, sitting up in recliner at bedside, irritable about insurance information/slow insurance approval, NAD  HENT:  Head: Normocephalic and atraumatic.  Nose: Nose normal.  Mouth/Throat: Oropharynx is clear and moist. No oropharyngeal exudate.  L facial droop noted; L tongue deviation L face decreased sensation/numb  Eyes: Conjunctivae are normal. Right eye exhibits no discharge. Left eye exhibits no discharge.  EOMs intact; a little nystagmus to L, not to R  Neck: No tracheal deviation present.  Cardiovascular: Normal rate, regular rhythm and normal heart sounds.  No murmur heard. Respiratory: No stridor.  CTA B/L- no W/R/R  GI:  Soft, NT, ND, (+)BS hypoactive  Musculoskeletal:     Cervical back: Normal range of motion and neck supple.     Comments: RUE and RLE Deltoid, biceps, triceps, WE, grip, finger abd, HF, KE, KF, DF and PF- all 5/5  LUE and LLE- pt refused to try and move- said 0/5- didn't see  any spontaneous movement of L side while in room.   Neurological:  He has normal reflexes. He exhibits normal muscle tone. Coordination abnormal.  Patient is alert sitting up in bed.  Mood is a bit flat but appropriate.  Makes eye contact with examiner.  Follows commands.  Provides his name and age.  Fair insight and awareness of deficits.  Skin: Skin is warm and dry.  No skin breakdown seen  Psychiatric:  Irritable- refused to answer questions since "already has before".     Results for orders placed or performed during the hospital encounter of 01/07/20 (from the past 48 hour(s))  Lupus anticoagulant panel     Status: None   Collection Time: 01/08/20 10:28 AM  Result Value Ref Range   PTT Lupus Anticoagulant 32.7 0.0 - 51.9 sec   DRVVT 36.7 0.0 - 47.0 sec   Lupus Anticoag Interp Comment:     Comment: (NOTE) No lupus anticoagulant was detected. Performed At: [facility] [address] [place], [place] [id number] Jolene SchimkeNagendra Sanjai MD WG:[id number]:[phone]   Beta-2-glycoprotein i abs, IgG/M/A     Status: None   Collection Time: 01/08/20 10:28 AM  Result Value Ref Range   Beta-2 Glyco I IgG <9 0 - 20 GPI IgG units    Comment: (NOTE) The reference interval reflects a 3SD or 99th percentile interval, which is thought to represent a potentially clinically significant result in accordance with the International Consensus Statement on the classification criteria for definitive antiphospholipid syndrome (APS). J Thromb Haem 2006;4:295-306.    Beta-2-Glycoprotein I IgM <9 0 - 32 GPI IgM units    Comment: (NOTE) The reference interval reflects a 3SD or 99th percentile interval, which is thought to represent a potentially clinically significant result in accordance with the International Consensus Statement on the classification criteria for definitive antiphospholipid syndrome (APS). J Thromb Haem 2006;4:295-306. Performed At: [facility] [address] [place], [place] [id number] Jolene SchimkeNagendra Sanjai MD MW:[id number]:[phone]    Beta-2-Glycoprotein I IgA  <9 0 - 25 GPI IgA units    Comment: (NOTE) The reference interval reflects a 3SD or 99th percentile interval, which is thought to represent a potentially clinically significant result in accordance with the International Consensus Statement on the classification criteria for definitive antiphospholipid syndrome (APS). J Thromb Haem 2006;4:295-306.   Homocysteine, serum     Status: None   Collection Time: 01/08/20 10:28 AM  Result Value Ref Range   Homocysteine 13.9 0.0 - 14.5 umol/L    Comment: (NOTE) Performed At: [facility] [address] [place], [place] [id number] Jolene SchimkeNagendra Sanjai MD HK:[id number]:[phone]   Cardiolipin antibodies, IgG, IgM, IgA     Status: None   Collection Time: 01/08/20 10:28 AM  Result Value Ref Range   Anticardiolipin IgG <9 0 - 14 GPL U/mL    Comment: (NOTE)                          Negative:              <15                          Indeterminate:     15 - 20                          Low-Med Positive: >20 - 80  High Positive:         >80    Anticardiolipin IgM <9 0 - 12 MPL U/mL    Comment: (NOTE)                          Negative:              <13                          Indeterminate:     13 - 20                          Low-Med Positive: >20 - 80                          High Positive:         >80    Anticardiolipin IgA <9 0 - 11 APL U/mL    Comment: (NOTE)                          Negative:              <12                          Indeterminate:     12 - 20                          Low-Med Positive: >20 - 80                          High Positive:         >80 Performed At: [facility] [address] [place], [place] [id number] Jolene Schimke MD WU:[id number]   ANA, IFA (with reflex)     Status: None   Collection Time: 01/08/20 10:28 AM  Result Value Ref Range   ANA Ab, IFA Negative     Comment: (NOTE)                                     Negative   <1:80                                     Borderline   1:80                                     Positive   >1:80 Performed At: [facility] [address] [place], [place] [id number] Jolene Schimke MD VH:[id number]   Vitamin B12     Status: None   Collection Time: 01/08/20 10:28 AM  Result Value Ref Range   Vitamin B-12 362 180 - 914 pg/mL    Comment: (NOTE) This assay is not validated for testing neonatal or myeloproliferative syndrome specimens for Vitamin B12 levels. Performed at [facility], [address], [place], [place] [address]   RPR     Status: None   Collection Time: 01/08/20 10:28 AM  Result Value Ref Range   RPR Ser Ql NON REACTIVE  NON REACTIVE    Comment: Performed at [facility], [address], [place], [place] [address]  CBC     Status: Abnormal   Collection Time: 01/09/20  4:29 AM  Result Value Ref Range   WBC 9.2 4.0 - 10.5 K/uL   RBC 4.81 4.22 - 5.81 MIL/uL   Hemoglobin 14.2 13.0 - 17.0 g/dL   HCT 60.4 54.0 - 98.1 %   MCV 88.1 80.0 - 100.0 fL   MCH 29.5 26.0 - 34.0 pg   MCHC 33.5 30.0 - 36.0 g/dL   RDW 19.1 (H) 47.8 - 29.5 %   Platelets 256 150 - 400 K/uL   nRBC 0.0 0.0 - 0.2 %    Comment: Performed at [facility], [address] [place], [place] [address]  Basic metabolic panel     Status: Abnormal   Collection Time: 01/09/20  4:29 AM  Result Value Ref Range   Sodium 140 135 - 145 mmol/L   Potassium 3.4 (L) 3.5 - 5.1 mmol/L   Chloride 107 98 - 111 mmol/L   CO2 24 22 - 32 mmol/L   Glucose, Bld 98 70 - 99 mg/dL    Comment: Glucose reference range applies only to samples taken after fasting for at least 8 hours.   BUN 9 6 - 20 mg/dL   Creatinine, Ser 8.65 0.61 - 1.24 mg/dL   Calcium 8.9 8.9 - 78.4 mg/dL   GFR calc non Af Amer >60 >60 mL/min   GFR calc Af Amer >60 >60 mL/min   Anion gap 9 5 - 15    Comment: Performed at [facility], [address] [place], [place] [address]  CBC with Differential/Platelet     Status: Abnormal   Collection Time: 01/10/20  5:17 AM   Result Value Ref Range   WBC 9.0 4.0 - 10.5 K/uL   RBC 4.67 4.22 - 5.81 MIL/uL   Hemoglobin 13.8 13.0 - 17.0 g/dL   HCT 52.8 41.3 - 24.4 %   MCV 87.8 80.0 - 100.0 fL   MCH 29.6 26.0 - 34.0 pg   MCHC 33.7 30.0 - 36.0 g/dL   RDW 01.0 (H) 27.2 - 53.6 %   Platelets 233 150 - 400 K/uL   nRBC 0.0 0.0 - 0.2 %   Neutrophils Relative % 65 %   Neutro Abs 5.9 1.7 - 7.7 K/uL   Lymphocytes Relative 21 %   Lymphs Abs 1.9 0.7 - 4.0 K/uL   Monocytes Relative 11 %   Monocytes Absolute 1.0 0.1 - 1.0 K/uL   Eosinophils Relative 1 %   Eosinophils Absolute 0.1 0.0 - 0.5 K/uL   Basophils Relative 1 %   Basophils Absolute 0.1 0.0 - 0.1 K/uL   Immature Granulocytes 1 %   Abs Immature Granulocytes 0.05 0.00 - 0.07 K/uL    Comment: Performed at [facility], [address], [place], [place] [address]   CT HEAD WO CONTRAST  Result Date: 01/09/2020 CLINICAL DATA:  Follow-up examination for acute stroke. EXAM: CT HEAD WITHOUT CONTRAST TECHNIQUE: Contiguous axial images were obtained from the base of the skull through the vertex without intravenous contrast. COMPARISON:  Prior MRI from 01/07/2020. FINDINGS: Brain: Evolving cytotoxic edema involving the right frontal lobe is seen, with involvement of primarily the right MCA distribution, although possible involvement of right ACA territory present as well. Overall, distribution similar to previous MRI. Evidence for hemorrhagic transformation with blood products seen at the right caudate  and lentiform nuclei, also similar. Associated regional mass effect with up to 3 mm of right-to-left shift. Right lateral ventricle partially effaced. No hydrocephalus or ventricular trapping. Basilar cisterns remain patent. No new large vessel territory infarct or intracranial hemorrhage. No extra-axial fluid collection. No mass lesion. Vascular: No hyperdense vessel. Skull: Scalp soft tissues and calvarium demonstrate no new finding. Sinuses/Orbits: Globes and orbital soft  tissues demonstrate no acute finding. Left greater than right sphenoid sinus disease. Paranasal sinuses are otherwise largely clear. Trace left mastoid effusion noted. Other: None. IMPRESSION: 1. Evolving right cerebral infarct with evidence for hemorrhagic transformation at the right basal ganglia, similar in appearance and distribution as compared to previous MRI. Associated mass effect with 3 mm of right-to-left shift. No hydrocephalus or ventricular trapping. 2. No other new acute intracranial abnormality. Electronically Signed   By: Rise Mu M.D.   On: 01/09/2020 03:18   CT ANGIO NECK W OR WO CONTRAST  Result Date: 01/08/2020 CLINICAL DATA:  Right MCA stroke EXAM: CT ANGIOGRAPHY NECK TECHNIQUE: Multidetector CT imaging of the neck was performed using the standard protocol during bolus administration of intravenous contrast. Multiplanar CT image reconstructions and MIPs were obtained to evaluate the vascular anatomy. Carotid stenosis measurements (when applicable) are obtained utilizing NASCET criteria, using the distal internal carotid diameter as the denominator. CONTRAST:  75 mL OMNIPAQUE IOHEXOL 350 MG/ML SOLN COMPARISON:  None. FINDINGS: Aortic arch: Great vessel origins are patent. Right carotid system: Patent. Minimal calcified plaque at the ICA origin. There is no measurable stenosis or evidence of dissection. Left carotid system: Patent. There is no measurable stenosis or evidence of dissection. Vertebral arteries: Patent. No measurable stenosis or evidence of dissection. Skeleton: Mild degenerative changes, greatest at C6-C7 and C5-C6. Other neck: No mass or adenopathy. Upper chest: No apical lung mass. IMPRESSION: No large vessel occlusion or hemodynamically significant stenosis. Electronically Signed   By: Guadlupe Spanish M.D.   On: 01/08/2020 10:14   ECHOCARDIOGRAM COMPLETE  Result Date: 01/08/2020    ECHOCARDIOGRAM REPORT   Patient Name:   YOUSSOUF SHIPLEY Date of Exam: 01/08/2020  Medical Rec #:  [id number]        Height:       74.0 in Accession #:    [id number]       Weight:       220.0 lb Date of Birth:  1976-02-20         BSA:          2.263 m Patient Age:    43 years         BP:           147/115 mmHg Patient Gender: M                HR:           77 bpm. Exam Location:  Inpatient Procedure: 2D Echo Indications:    434.91 stroke  History:        Patient has no prior history of Echocardiogram examinations.                 Stroke; Risk Factors:Current Smoker. ETOH & Tobacco use.  Sonographer:    [name] RDCS (AE) Referring Phys: [id number] JENNIFER YATES  Sonographer Comments: Image acquisition challenging due to respiratory motion. extremely limited patient mobility IMPRESSIONS  1. Left ventricular ejection fraction, by estimation, is 60 to 65%. The left ventricle has normal function. The left ventricle has no regional wall motion abnormalities. Left ventricular diastolic  parameters were normal.  2. Right ventricular systolic function is normal. The right ventricular size is normal.  3. The mitral valve is normal in structure. No evidence of mitral valve regurgitation. No evidence of mitral stenosis.  4. The aortic valve is normal in structure. Aortic valve regurgitation is not visualized. No aortic stenosis is present.  5. The inferior vena cava is normal in size with greater than 50% respiratory variability, suggesting right atrial pressure of 3 mmHg. FINDINGS  Left Ventricle: Left ventricular ejection fraction, by estimation, is 60 to 65%. The left ventricle has normal function. The left ventricle has no regional wall motion abnormalities. The left ventricular internal cavity size was normal in size. There is  no left ventricular hypertrophy. Left ventricular diastolic parameters were normal. Normal left ventricular filling pressure. Right Ventricle: The right ventricular size is normal. No increase in right ventricular wall thickness. Right ventricular systolic function is normal. Left  Atrium: Left atrial size was normal in size. Right Atrium: Right atrial size was normal in size. Pericardium: There is no evidence of pericardial effusion. Mitral Valve: The mitral valve is normal in structure. Normal mobility of the mitral valve leaflets. No evidence of mitral valve regurgitation. No evidence of mitral valve stenosis. Tricuspid Valve: The tricuspid valve is normal in structure. Tricuspid valve regurgitation is not demonstrated. No evidence of tricuspid stenosis. Aortic Valve: The aortic valve is normal in structure. Aortic valve regurgitation is not visualized. No aortic stenosis is present. Pulmonic Valve: The pulmonic valve was normal in structure. Pulmonic valve regurgitation is not visualized. No evidence of pulmonic stenosis. Aorta: The aortic root is normal in size and structure. Venous: The inferior vena cava is normal in size with greater than 50% respiratory variability, suggesting right atrial pressure of 3 mmHg. IAS/Shunts: No atrial level shunt detected by color flow Doppler.  LEFT VENTRICLE PLAX 2D LVIDd:         5.40 cm  Diastology LVIDs:         3.20 cm  LV e' lateral:   12.30 cm/s LV PW:         1.00 cm  LV E/e' lateral: 4.9 LV IVS:        0.90 cm  LV e' medial:    10.20 cm/s LVOT diam:     2.20 cm  LV E/e' medial:  5.9 LV SV:         61 LV SV Index:   27 LVOT Area:     3.80 cm  RIGHT VENTRICLE RV S prime:     12.50 cm/s TAPSE (M-mode): 2.2 cm LEFT ATRIUM             Index       RIGHT ATRIUM           Index LA diam:        3.40 cm 1.50 cm/m  RA Area:     14.80 cm LA Vol (A2C):   46.5 ml 20.55 ml/m RA Volume:   33.20 ml  14.67 ml/m LA Vol (A4C):   20.4 ml 9.01 ml/m LA Biplane Vol: 30.8 ml 13.61 ml/m  AORTIC VALVE LVOT Vmax:   86.80 cm/s LVOT Vmean:  51.000 cm/s LVOT VTI:    0.161 m  AORTA Ao Root diam: 3.10 cm MITRAL VALVE MV Area (PHT): 3.31 cm    SHUNTS MV Decel Time: 229 msec    Systemic VTI:  0.16 m MV E velocity: 60.40 cm/s  Systemic Diam: 2.20 cm MV A velocity: 52.70  cm/s MV E/A ratio:  1.15 Mihai Croitoru MD Electronically signed by Thurmon Fair MD Signature Date/Time: 01/08/2020/2:37:28 PM    Final    VAS [place] LOWER EXTREMITY VENOUS (DVT)  Result Date: 01/08/2020  Lower Venous DVTStudy Indications: Stroke.  Comparison Study: No prior study Performing Technologist: Gertie Fey MHA, RDMS, RVT, RDCS  Examination Guidelines: A complete evaluation includes B-mode imaging, spectral Doppler, color Doppler, and power Doppler as needed of all accessible portions of each vessel. Bilateral testing is considered an integral part of a complete examination. Limited examinations for reoccurring indications may be performed as noted. The reflux portion of the exam is performed with the patient in reverse Trendelenburg.  +---------+---------------+---------+-----------+----------+--------------+ RIGHT    CompressibilityPhasicitySpontaneityPropertiesThrombus Aging +---------+---------------+---------+-----------+----------+--------------+ CFV      Full           Yes      Yes                                 +---------+---------------+---------+-----------+----------+--------------+ SFJ      Full                                                        +---------+---------------+---------+-----------+----------+--------------+ FV Prox  Full                                                        +---------+---------------+---------+-----------+----------+--------------+ FV Mid   Full                                                        +---------+---------------+---------+-----------+----------+--------------+ FV DistalFull                                                        +---------+---------------+---------+-----------+----------+--------------+ PFV      Full                                                        +---------+---------------+---------+-----------+----------+--------------+ POP      Full           Yes      Yes                                  +---------+---------------+---------+-----------+----------+--------------+ PTV      Full                                                        +---------+---------------+---------+-----------+----------+--------------+  PERO     Full                                                        +---------+---------------+---------+-----------+----------+--------------+   +---------+---------------+---------+-----------+----------+--------------+ LEFT     CompressibilityPhasicitySpontaneityPropertiesThrombus Aging +---------+---------------+---------+-----------+----------+--------------+ CFV      Full           Yes      Yes                                 +---------+---------------+---------+-----------+----------+--------------+ SFJ      Full                                                        +---------+---------------+---------+-----------+----------+--------------+ FV Prox  Full                                                        +---------+---------------+---------+-----------+----------+--------------+ FV Mid   Full                                                        +---------+---------------+---------+-----------+----------+--------------+ FV DistalFull                                                        +---------+---------------+---------+-----------+----------+--------------+ PFV      Full                                                        +---------+---------------+---------+-----------+----------+--------------+ POP      Full           Yes      Yes                                 +---------+---------------+---------+-----------+----------+--------------+ PTV      Full                                                        +---------+---------------+---------+-----------+----------+--------------+ PERO     Full                                                         +---------+---------------+---------+-----------+----------+--------------+  Summary: RIGHT: - There is no evidence of deep vein thrombosis in the lower extremity.  - No cystic structure found in the popliteal fossa.  LEFT: - There is no evidence of deep vein thrombosis in the lower extremity.  - No cystic structure found in the popliteal fossa.  *See table(s) above for measurements and observations. Electronically signed by Harold Barban MD on 01/08/2020 at 5:49:38 PM.    Final        Medical Problem List and Plan: 1.  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  Status post TEE 01/10/2020  -patient may shower  -ELOS/Goals: 2-3 weeks; supervision to min assist 2.  Antithrombotics: -DVT/anticoagulation: Lovenox initiated 01/10/2020  -antiplatelet therapy: Aspirin 325 mg daily 3. Pain Management: Lidoderm patch, oxycodone 5 mg every 6 hours as needed 4. Mood: Advised emotional support  -antipsychotic agents: N/A 5. Neuropsych: This patient is capable of making decisions on his own behalf. 6. Skin/Wound Care: Routine skin checks 7. Fluids/Electrolytes/Nutrition: Routine in and outs with follow-up chemistries 8.  Dysphagia.  Dysphagia #2 nectar liquids.  Follow-up speech therapy 9.  Permissive hypertension.  Norvasc 10 mg daily.  Monitor with increased mobility 10.  Hyperlipidemia.  Lipitor 11.  History of tobacco/alcohol/marijuana use.  NicoDerm patch.  Monitor for any signs of withdrawal provide counseling      Cathlyn Parsons, PA-C 01/10/2020   I have personally performed a face to face diagnostic evaluation of this patient and formulated the key components of the plan.  Additionally, I have personally reviewed laboratory data, imaging studies, as well as relevant notes and concur with the physician assistant's documentation above.   The patient's status has not changed from the original H&P.   Any changes in documentation from the acute care chart have been noted above.

## 2020-01-10 NOTE — Anesthesia Procedure Notes (Signed)
Procedure Name: MAC Date/Time: 01/10/2020 8:55 AM Performed by: Imagene Riches, CRNA Pre-anesthesia Checklist: Patient identified, Emergency Drugs available, Suction available, Patient being monitored and Timeout performed Patient Re-evaluated:Patient Re-evaluated prior to induction Oxygen Delivery Method: Nasal cannula

## 2020-01-10 NOTE — Progress Notes (Signed)
Physical Therapy Treatment Patient Details Name: Derek [name] MRN: [id number] DOB: [birth year] Today's Date: 01/10/2020    History of Present Illness 44 yo male no known medical history presenting as Code Stroke.  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 There is hemorrhagic transformation of the right basal ganglia infarct with mass-effect on the lateral ventricle."     PT Comments    Pt supine in bed on arrival today.  He remains impulsive and eager to mobilize.  Pt able to perform standing and pre gt activities this afternoon in front of mirror for visual feed back.  Pt left sitting in recliner with chair alarm pad activated.  He continues to be an excellent candidate for aggressive rehab at CIR to improve strength and function before returning home.     Follow Up Recommendations  CIR;Supervision/Assistance - 24 hour     Equipment Recommendations  Wheelchair (measurements PT);Wheelchair cushion (measurements PT)    Recommendations for Other Services Rehab consult     Precautions / Restrictions Precautions Precautions: Fall Precaution Comments: L neglect Restrictions Weight Bearing Restrictions: No    Mobility  Bed Mobility Overal bed mobility: Needs Assistance Bed Mobility: Supine to Sit     Supine to sit: +2 for safety/equipment;Min assist     General bed mobility comments: MinA to bring LLE over to edge of bed and pt pulling trunk up to sitting with RUE with bed rail  Transfers Overall transfer level: Needs assistance Equipment used: Ambulation equipment used(sara stedy) Transfers: Sit to/from Stand Sit to Stand: +2 safety/equipment;+2 physical assistance;Min assist(into sara stedy to achieve L terminal knee extension he require mod +2.)         General transfer comment: min +2 to pull into standing and mod +2 to weight shift L to R with facilitation to move LLE into knee extension.  Pt has not ability to  activate L quad.  Ambulation/Gait Ambulation/Gait assistance: (NT)           General Gait Details: unable   Stairs             Wheelchair Mobility    Modified Rankin (Stroke Patients Only) Modified Rankin (Stroke Patients Only) Pre-Morbid Rankin Score: No symptoms Modified Rankin: Severe disability     Balance Overall balance assessment: Needs assistance Sitting-balance support: Single extremity supported;Feet supported Sitting balance-Leahy Scale: Fair Sitting balance - Comments: pushing L in sitting, cues to keep R hand in lap Postural control: Left lateral lean Standing balance support: Single extremity supported;During functional activity Standing balance-Leahy Scale: Poor Standing balance comment: maxA for standing in Stedy with heavy left lateral lean                            Cognition Arousal/Alertness: Awake/alert Behavior During Therapy: Impulsive;Flat affect Overall Cognitive Status: Impaired/Different from baseline Area of Impairment: Awareness;Safety/judgement                         Safety/Judgement: Decreased awareness of safety;Decreased awareness of deficits Awareness: Intellectual   General Comments: Pt follows 1 step commands. Pt attempting to pull self up by rail to get EOB and asking if he could pull out IV's during session.      Exercises      General Comments        Pertinent Vitals/Pain Pain Assessment: No/denies pain Faces Pain Scale: No hurt    Home Living  Prior Function            PT Goals (current goals can now be found in the care plan section) Acute Rehab PT Goals Patient Stated Goal: get to rehab Potential to Achieve Goals: Good Progress towards PT goals: Progressing toward goals    Frequency    Min 4X/week      PT Plan Current plan remains appropriate    Co-evaluation PT/OT/SLP Co-Evaluation/Treatment: Yes Reason for Co-Treatment: Complexity of  the patient's impairments (multi-system involvement);For patient/therapist safety PT goals addressed during session: Mobility/safety with mobility OT goals addressed during session: ADL's and self-care      AM-PAC PT "6 Clicks" Mobility   Outcome Measure  Help needed turning from your back to your side while in a flat bed without using bedrails?: A Little Help needed moving from lying on your back to sitting on the side of a flat bed without using bedrails?: A Little Help needed moving to and from a bed to a chair (including a wheelchair)?: A Lot Help needed standing up from a chair using your arms (e.g., wheelchair or bedside chair)?: A Lot Help needed to walk in hospital room?: Total Help needed climbing 3-5 steps with a railing? : Total 6 Click Score: 12    End of Session Equipment Utilized During Treatment: Gait belt Activity Tolerance: Patient tolerated treatment well Patient left: in chair;with call bell/phone within reach;with chair alarm set Nurse Communication: Mobility status PT Visit Diagnosis: Other abnormalities of gait and mobility (R26.89);Other symptoms and signs involving the nervous system (R29.898);Hemiplegia and hemiparesis Hemiplegia - Right/Left: Left Hemiplegia - dominant/non-dominant: Non-dominant Hemiplegia - caused by: Cerebral infarction;Nontraumatic intracerebral hemorrhage     Time: 8768-1157 PT Time Calculation (min) (ACUTE ONLY): 25 min  Charges:  $Therapeutic Activity: 8-22 mins                     Bonney Leitz , PTA Acute Rehabilitation Services Pager [phone] Office [phone]     Sierra Bissonette Artis Delay 01/10/2020, 3:45 PM

## 2020-01-10 NOTE — Transfer of Care (Signed)
Immediate Anesthesia Transfer of Care Note  Patient: Derek [name]  Procedure(s) Performed: TRANSESOPHAGEAL ECHOCARDIOGRAM (TEE) (N/A ) BUBBLE STUDY  Patient Location: Endoscopy Unit  Anesthesia Type:MAC  Level of Consciousness: awake, alert  and oriented  Airway & Oxygen Therapy: Patient Spontanous Breathing and Patient connected to nasal cannula oxygen  Post-op Assessment: Report given to RN and Post -op Vital signs reviewed and stable  Post vital signs: Reviewed and stable  Last Vitals:  Vitals Value Taken Time  BP 125/82 01/10/20 0927  Temp 37.5 C 01/10/20 0926  Pulse 94 01/10/20 0928  Resp 29 01/10/20 0928  SpO2 99 % 01/10/20 0928  Vitals shown include unvalidated device data.  Last Pain:  Vitals:   01/10/20 0926  TempSrc: Oral  PainSc: 0-No pain      Patients Stated Pain Goal: 0 (16/10/96 0454)  Complications: No apparent anesthesia complications

## 2020-01-10 NOTE — Anesthesia Postprocedure Evaluation (Signed)
Anesthesia Post Note  Patient: Derek [name]  Procedure(s) Performed: TRANSESOPHAGEAL ECHOCARDIOGRAM (TEE) (N/A ) BUBBLE STUDY     Patient location during evaluation: Endoscopy Anesthesia Type: MAC Level of consciousness: awake and alert Pain management: pain level controlled Vital Signs Assessment: post-procedure vital signs reviewed and stable Respiratory status: spontaneous breathing, nonlabored ventilation, respiratory function stable and patient connected to nasal cannula oxygen Cardiovascular status: blood pressure returned to baseline and stable Postop Assessment: no apparent nausea or vomiting Anesthetic complications: no    Last Vitals:  Vitals:   01/10/20 0940 01/10/20 0947  BP: 132/85 (!) 141/84  Pulse: 75 69  Resp: (!) 26 (!) 22  Temp:    SpO2: 97% 98%    Last Pain:  Vitals:   01/10/20 0947  TempSrc:   PainSc: 0-No pain                 Barnet Glasgow

## 2020-01-10 NOTE — Anesthesia Preprocedure Evaluation (Addendum)
Anesthesia Evaluation  Patient identified by MRN, date of birth, ID band Patient awake    Reviewed: Allergy & Precautions, NPO status , Patient's Chart, lab work & pertinent test results  Airway Mallampati: II  TM Distance: >3 FB Neck ROM: Full    Dental no notable dental hx. (+) Teeth Intact, Dental Advisory Given   Pulmonary Current Smoker,    Pulmonary exam normal breath sounds clear to auscultation       Cardiovascular hypertension, Pt. on medications and Pt. on home beta blockers Normal cardiovascular exam Rhythm:Regular Rate:Normal  01/08/20 Echo 1. Left ventricular ejection fraction, by estimation, is 60 to 65%. The  left ventricle has normal function. The left ventricle has no regional  wall motion abnormalities. Left ventricular diastolic parameters were  normal.    Neuro/Psych CVA, Residual Symptoms negative psych ROS   GI/Hepatic negative GI ROS, (+)     substance abuse  alcohol use,   Endo/Other  negative endocrine ROS  Renal/GU negative Renal ROS     Musculoskeletal negative musculoskeletal ROS (+)   Abdominal   Peds  Hematology   Anesthesia Other Findings   Reproductive/Obstetrics                            Anesthesia Physical Anesthesia Plan  ASA: III  Anesthesia Plan: MAC   Post-op Pain Management:    Induction: Intravenous  PONV Risk Score and Plan: Treatment may vary due to age or medical condition  Airway Management Planned: Nasal Cannula and Natural Airway  Additional Equipment: None  Intra-op Plan:   Post-operative Plan:   Informed Consent: I have reviewed the patients History and Physical, chart, labs and discussed the procedure including the risks, benefits and alternatives for the proposed anesthesia with the patient or authorized representative who has indicated his/her understanding and acceptance.     Dental advisory given  Plan Discussed  with:   Anesthesia Plan Comments:        Anesthesia Quick Evaluation

## 2020-01-10 NOTE — Progress Notes (Addendum)
Inpatient Rehab Admissions Coordinator:   Spoke to pt's significant other, Briscoe Deutscher, regarding recommendation for CIR.  She is agreeable and able to provide 24/7 assist if needed from discharge.  She states he does have health insurance through [employer], which I am working to verify.  Will need ins authorization if so.    Addendum: Confirmed with Marvene Staff policy is a Warden/ranger through her employer, will not be processed till Monday. I will f/u on Monday to open for authorization.   Estill Dooms, PT, DPT Admissions Coordinator [phone] 01/10/20  9:00 AM

## 2020-01-10 NOTE — Progress Notes (Signed)
Orthopedic Tech Progress Note Patient Details:  Derek [name] 13-Dec-1975 [id number]  Ortho Devices Type of Ortho Device: Knee Immobilizer Ortho Device/Splint Interventions: Application   Post Interventions Patient Tolerated: Well Instructions Provided: Care of device   Saul Fordyce 01/10/2020, 6:11 PM

## 2020-01-10 NOTE — Progress Notes (Signed)
  Echocardiogram 2D Echocardiogram has been performed.  Derek [name] 01/10/2020, 9:47 AM

## 2020-01-10 NOTE — CV Procedure (Signed)
   TRANSESOPHAGEAL ECHOCARDIOGRAM GUIDED DIRECT CURRENT CARDIOVERSION  NAME:  Derek [name]    MRN: [id number] DOB:  05-10-1976    ADMIT DATE: 01/07/2020  INDICATIONS: Stroke  PROCEDURE:   Informed consent was obtained prior to the procedure. The risks, benefits and alternatives for the procedure were discussed and the patient comprehended these risks.  Risks include, but are not limited to, cough, sore throat, vomiting, nausea, somnolence, esophageal and stomach trauma or perforation, bleeding, low blood pressure, aspiration, pneumonia, infection, trauma to the teeth and death.    After a procedural time-out, the oropharynx was anesthetized and the patient was sedated by the anesthesia service. The transesophageal probe was inserted in the esophagus and stomach without difficulty and multiple views were obtained. Anesthesia was monitored by Dr. Richardson Landry. Probe insertion time 15 minutes.    COMPLICATIONS:    Complications: No complications Patient tolerated procedure well.  KEY FINDINGS:  1. No LAA/LA thrombus.  2. No PFO by color or bubble study.  3. LVEF normal 60-65%. 4. No significant valvular heart disease.  5. Full report to follow.   Gerri Spore T. Flora Lipps, MD [facility]  [address], [address] [place], [place] [address] [phone]  9:24 AM

## 2020-01-10 NOTE — Progress Notes (Addendum)
PROGRESS NOTE    Derek [name]  TMA:[id number] DOB: [birth year] DOA: 01/07/2020 PCP: [name], MD   Brief Narrative:  HPI per Dr. [name] on 01/07/20 Derek [name] is a 44 y.o. male with no known medical history presenting as Code Stroke.  The patient is mostly sleeping and awakes and appears appropriate but his conversation doesn't exactly make sense.  He complains of being cold - when I asked him to straighten his right leg out (it was behind his left leg), he says he was "just putting it behind this thing to keep it warm" and seems unaware completely of his left side.  He reports that he needs to call his work so he doesn't get fired, but then didn't actually want to use the phone. He reports headache.  I called and spoke with his sister.  She was notified this AM that he was taken here.  She reports that he went out Saturday night to a family member's birthday party; they took him home and he was very intoxicated.  He was placed on the bed after 0200.  His girlfriend noted him on the floor sometime later.  She found him there again sometime later and had someone help him into the bed.  He was not moving on one side.  He apparently fell back on the floor at some point.  This AM, she finally called 911.  He has chronic back pain from a remote accident.  He has been under a lot of stress - including with his live-in girlfriend.  He also has a 49yo son.   ED Course:  EMS called for a fall, noted left-sided weakness, facial droop, slurred speech, rightward gaze.  CT consistent with R MCA CVA.  Neuro has seen, MRI recommended.  Temp 100.5, given empiric abx, no symptoms or apparent source.  **Interim History Currently being worked up for stroke and currently there is no definitive cause so patient undergoing a TEE which was done this morning and showed no LA/LAA thrombus and no PFO by color bubble study in the normal left ventricular EF of 60 to 65% with no significant valvular heart  disease.  He did have a hemorrhagic conversion and neurology does recommend no dual antiplatelet therapy because of the hemorrhagic conversion.  Currently on aspirin monotherapy for now.  PT OT recommending CIR at discharge and currently after discussion with the case manager and the social worker patient cannot be discharged until at least Monday until insurance is obtained and currently does not have safe disposition otherwise she will need to remain in the hospital for now.  Assessment & Plan:   Principal Problem:   Acute ischemic right MCA stroke (HCC) Active Problems:   Tobacco dependence   Marijuana dependence ([place])   Alcohol dependence ([place])   Cerebrovascular accident (CVA) due to thrombosis of right middle cerebral artery (HCC)   Chronic pain syndrome   Hypokalemia   Dysphagia, post-stroke  Acute CVA with Cerebral Edema with hemorrhagic conversion of the CVA -Patient was last seen normal at approximately 2 AM on Sunday morning, and has had recurrent falls -With significant left-sided deficits and cognitive impairment, as well as lethargy on examination which is improved since yesterday -CT head: Recent M1 segment, right middle cerebral artery infarct with mass-effect and possible petechial hemorrhage in portions of the right basal ganglia.  Proximal extent of postero superior right frontal and adjacent right parietal lobes. -CTA neck: neck no large vessel occlusion or significant stenosis -MRI head  showed acute right MCA territory going basal ganglia and right parietal lobe.  Hemorrhagic transformation of the right basal ganglia infarct with mass-effect on the lateral ventricle -Echocardiogram showed EF of 50-93%, LV diastolic parameters normal. -Lower extremity Doppler showed no evidence of DVT -Carotid doppler: B/L 1-39% ICA stenosis.  Bilateral vertebral arteries demonstrate antegrade flow -LDL 138, hemoglobin A1c 5.6 -Full lipid panel showed a total cholesterol/HDL ratio of 4.3,  cholesterol level 204, HDL 47, LDL 138, triglycerides of 94, VLDL 19 -Continue with Atorvastatin 40 mg p.o. daily -Repeat head CT scan showed "Evolving right cerebral infarct with evidence for hemorrhagic transformation at the right basal ganglia, similar in appearance and distribution as compared to previous MRI. Associated mass effect with 3 mm of right-to-left shift. No hydrocephalus or ventricular trapping. 2. No other new acute intracranial abnormality" -Neurology consulted and appreciated' -Dr. Erlinda Hong recommends TEE which was done today and showed no LAA/LAA thrombus and no PFO by color bubble study.  The left ventricular EF was normal with the ejection fraction of 60 to 65% and there is no significant valvular heart disease noted -Hypercoagulable work-up ordered and his PTT lupus anticoagulant, DRVVT was negative and his ANA antibody was negative -Continue Aspirin 325 mg daily for now.  Holding DAPT given his hemorrhagic transformation. -Neurology recommending now blood pressure less than 160 given his hemorrhagic conversion the long-term blood pressure goal normotensive -PT and OT recommending CIR and CIR was consulted for further evaluation recommendations -SLP evaluated and recommended dysphagia 2 diet with nectar thick liquids and are going to defer recommending instrumental swallow assessment until after the TEE; SLP evaluated and the patient now on a dysphagia 3 mechanical soft diet with nectar thick liquids -Continue monitor and follow-up on SLP recommendations -CIR Physician Dr. Posey Pronto was consulted and feels that the patient would be a good candidate for CIR -Patient is to be discharged to CIR however unfortunately cannot go until at least Monday per my discussion with the transition of care team given the patient's insurance and that he has no other safe discharge disposition at this time he will need to remain in the hospital until that timeframe.  Essential Hypertension -Placed on  Amlodipine 10 mg po Daily  -Currently on metoprolol tartrate 5 mg IV every 12 -BP goal less than 160 given hemorrhagic conversion -He was ordered labetalol 20 mg IV once but was not given due to parameters not being met -BP is now 108/78  Alcohol Dependence -Continue CIWA protocol with IV lorazepam -No signs of withdrawal -Continue Folic acid, Multivitamin + Minerals, and Thiamine supplementation  Marijuana Dependence -UDS positive for THC -Continue with Cessation efforts   Tobacco Dependence -Continue Nicotine 7 mg TD Patch -Smoking Cessation Counseling given    SIRS -Patient with leukocytosis of 14.3 on admission and temperature of 100.5 F but this is now improved as his WBC is 9.0; patient has been afebrile for last 24 to 48 hours with a T-max of 99.6 -UA unremarkable for infection, urine culture shows no growth -Blood cultures pending showed no growth to date at 3 days -Suspect secondary to the above -Improving and he is afebrile now  Hypokalemia -Mild as K+ was 3.4 and today it is 3.9 -Continue to Monitor and Replete as Necessary -Repeat CMP in AM   HLD -Full lipid panel showed a total cholesterol/HDL ratio of 4.3, cholesterol level 204, HDL 47, LDL 138, triglycerides of 94, VLDL 19 -His ALT was mildly elevated at 43 and will need to continue to  monitor -Continue with Atorvastatin 40 mg p.o. daily  Elevated ALT -Mild at 43 -Continue monitor and trend hepatic function panel -If worsening or not improving will need to obtain a right upper quadrant ultrasound and acute hepatitis panel -Repeat CMP in the a.m.  Headache -Secondary to above -Not amenable acetaminophen so we will try oxycodone as we will try and avoid NSAIDs due to hemorrhagic conversion  **ADDENDUM 15:30:  Fall -Patient fell getting out of chair and hit his Head per Nursing -Will order Stat Head CT w/o Contrast as he has had a CVA with Hemorrhagic Conversion  -I have notified Neurology and will  await CT results  DVT prophylaxis: SCDs given his hemorrhagic Transformation  Code Status: FULL CODE  Family Communication: No family present at bedside Disposition Plan: Patient is from home and had a CVA which then had hemorrhagic conversion and requires CIR at discharge.  Will need to be fully worked up from a neurological standpoint and requires a TEE prior to discharge to [place] and likely this will be done tomorrow.  Anticipate discharging in the next 24 to 48 hours pending neurological clearance   Consultants:   Neurology  Cardiology for TEE    Procedures:  ECHOCARDIOGRAM IMPRESSIONS    1. Left ventricular ejection fraction, by estimation, is 60 to 65%. The  left ventricle has normal function. The left ventricle has no regional  wall motion abnormalities. Left ventricular diastolic parameters were  normal.  2. Right ventricular systolic function is normal. The right ventricular  size is normal.  3. The mitral valve is normal in structure. No evidence of mitral valve  regurgitation. No evidence of mitral stenosis.  4. The aortic valve is normal in structure. Aortic valve regurgitation is  not visualized. No aortic stenosis is present.  5. The inferior vena cava is normal in size with greater than 50%  respiratory variability, suggesting right atrial pressure of 3 mmHg.   FINDINGS  Left Ventricle: Left ventricular ejection fraction, by estimation, is 60  to 65%. The left ventricle has normal function. The left ventricle has no  regional wall motion abnormalities. The left ventricular internal cavity  size was normal in size. There is  no left ventricular hypertrophy. Left ventricular diastolic parameters  were normal. Normal left ventricular filling pressure.   Right Ventricle: The right ventricular size is normal. No increase in  right ventricular wall thickness. Right ventricular systolic function is  normal.   Left Atrium: Left atrial size was normal in size.     Right Atrium: Right atrial size was normal in size.   Pericardium: There is no evidence of pericardial effusion.   Mitral Valve: The mitral valve is normal in structure. Normal mobility of  the mitral valve leaflets. No evidence of mitral valve regurgitation. No  evidence of mitral valve stenosis.   Tricuspid Valve: The tricuspid valve is normal in structure. Tricuspid  valve regurgitation is not demonstrated. No evidence of tricuspid  stenosis.   Aortic Valve: The aortic valve is normal in structure. Aortic valve  regurgitation is not visualized. No aortic stenosis is present.   Pulmonic Valve: The pulmonic valve was normal in structure. Pulmonic valve  regurgitation is not visualized. No evidence of pulmonic stenosis.   Aorta: The aortic root is normal in size and structure.   Venous: The inferior vena cava is normal in size with greater than 50%  respiratory variability, suggesting right atrial pressure of 3 mmHg.   IAS/Shunts: No atrial level shunt detected by  color flow Doppler.     LEFT VENTRICLE  PLAX 2D  LVIDd:     5.40 cm Diastology  LVIDs:     3.20 cm LV e' lateral:  12.30 cm/s  LV PW:     1.00 cm LV E/e' lateral: 4.9  LV IVS:    0.90 cm LV e' medial:  10.20 cm/s  LVOT diam:   2.20 cm LV E/e' medial: 5.9  LV SV:     61  LV SV Index:  27  LVOT Area:   3.80 cm     RIGHT VENTRICLE  RV S prime:   12.50 cm/s  TAPSE (M-mode): 2.2 cm   LEFT ATRIUM       Index    RIGHT ATRIUM      Index  LA diam:    3.40 cm 1.50 cm/m RA Area:   14.80 cm  LA Vol (A2C):  46.5 ml 20.55 ml/m RA Volume:  33.20 ml 14.67 ml/m  LA Vol (A4C):  20.4 ml 9.01 ml/m  LA Biplane Vol: 30.8 ml 13.61 ml/m  AORTIC VALVE  LVOT Vmax:  86.80 cm/s  LVOT Vmean: 51.000 cm/s  LVOT VTI:  0.161 m    AORTA  Ao Root diam: 3.10 cm   MITRAL VALVE  MV Area (PHT): 3.31 cm  SHUNTS  MV Decel Time: 229 msec  Systemic VTI: 0.16  m  MV E velocity: 60.40 cm/s Systemic Diam: 2.20 cm  MV A velocity: 52.70 cm/s  MV E/A ratio: 1.15   LOWER EXTREMITY DUPLEX RIGHT:  - There is no evidence of deep vein thrombosis in the lower extremity.    - No cystic structure found in the popliteal fossa.    LEFT:  - There is no evidence of deep vein thrombosis in the lower extremity.    - No cystic structure found in the popliteal fossa.   TEE 01/10/20 1. No LAA/LA thrombus.  2. No PFO by color or bubble study.  3. LVEF normal 60-65%. 4. No significant valvular heart disease.   IMPRESSIONS    1. Left ventricular ejection fraction, by estimation, is 60 to 65%. The  left ventricle has normal function. The left ventricle has no regional  wall motion abnormalities.  2. Right ventricular systolic function is normal. The right ventricular  size is normal.  3. No left atrial/left atrial appendage thrombus was detected. The LAA  emptying velocity was 87 cm/s.  4. Redundant chordal tissue present. The mitral valve is normal in  structure. Trivial mitral valve regurgitation. No evidence of mitral  stenosis.  5. The aortic valve is tricuspid. Aortic valve regurgitation is not  visualized. No aortic stenosis is present.  6. Agitated saline contrast bubble study was negative, with no evidence  of any interatrial shunt.   Conclusion(s)/Recommendation(s): Normal biventricular function without  evidence of hemodynamically significant valvular heart disease. No LA/LAA  thrombus identified. Negative bubble study for interatrial shunt. No  intracardiac source of embolism detected  on this on this transesophageal echocardiogram.   FINDINGS  Left Ventricle: Left ventricular ejection fraction, by estimation, is 60  to 65%. The left ventricle has normal function. The left ventricle has no  regional wall motion abnormalities. The left ventricular internal cavity  size was normal in size. There is  no left ventricular  hypertrophy.   Right Ventricle: The right ventricular size is normal. No increase in  right ventricular wall thickness. Right ventricular systolic function is  normal.   Left Atrium: Left atrial size  was normal in size. No left atrial/left  atrial appendage thrombus was detected. The LAA emptying velocity was 87  cm/s.   Right Atrium: Right atrial size was normal in size.   Pericardium: Trivial pericardial effusion is present. The pericardial  effusion is circumferential.   Mitral Valve: Redundant chordal tissue present. The mitral valve is normal  in structure. Trivial mitral valve regurgitation. No evidence of mitral  valve stenosis.   Tricuspid Valve: The tricuspid valve is normal in structure. Tricuspid  valve regurgitation is trivial. No evidence of tricuspid stenosis.   Aortic Valve: The aortic valve is tricuspid. Aortic valve regurgitation is  not visualized. No aortic stenosis is present.   Pulmonic Valve: The pulmonic valve was normal in structure. Pulmonic valve  regurgitation is trivial. No evidence of pulmonic stenosis.   Aorta: The aortic root, ascending aorta and aortic arch are all  structurally normal, with no evidence of dilitation or obstruction.   Venous: The right upper pulmonary vein and left upper pulmonary vein are  normal.   IAS/Shunts: No atrial level shunt detected by color flow Doppler. Agitated  saline contrast was given intravenously to evaluate for intracardiac  shunting. Agitated saline contrast bubble study was negative, with no  evidence of any interatrial shunt.   EKG: Rhythm strip during this exam demostrated normal sinus rhythm.       AORTA  Ao Root diam: 3.60 cm  Ao Asc diam: 3.00 cm   TRICUSPID VALVE  TR Peak grad:  9.0 mmHg  TR Vmax:    150.00 cm/s      Antimicrobials: Anti-infectives (From admission, onward)   Start     Dose/Rate Route Frequency Ordered Stop   01/07/20 2200  vancomycin (VANCOREADY) IVPB 1500  mg/300 mL  Status:  Discontinued     1,500 mg 150 mL/hr over 120 Minutes Intravenous Every 12 hours 01/07/20 0834 01/07/20 1332   01/07/20 1400  ceFEPIme (MAXIPIME) 2 g in sodium chloride 0.9 % 100 mL IVPB  Status:  Discontinued     2 g 200 mL/hr over 30 Minutes Intravenous Every 8 hours 01/07/20 0834 01/07/20 1332   01/07/20 0830  ceFEPIme (MAXIPIME) 2 g in sodium chloride 0.9 % 100 mL IVPB     2 g 200 mL/hr over 30 Minutes Intravenous  Once 01/07/20 0826 01/07/20 0932   01/07/20 0830  metroNIDAZOLE (FLAGYL) IVPB 500 mg     500 mg 100 mL/hr over 60 Minutes Intravenous  Once 01/07/20 0826 01/07/20 1041   01/07/20 0830  vancomycin (VANCOCIN) IVPB 1000 mg/200 mL premix  Status:  Discontinued     1,000 mg 200 mL/hr over 60 Minutes Intravenous  Once 01/07/20 0826 01/07/20 0828   01/07/20 0830  vancomycin (VANCOREADY) IVPB 2000 mg/400 mL     2,000 mg 200 mL/hr over 120 Minutes Intravenous  Once 01/07/20 3976 01/07/20 1135     Subjective: Seen and examined at bedside and he had come back from his TEE.  Had no complaints.  No nausea or vomiting.  Thinks his head is a little bit better.  No other concerns or complaints at this time.  Objective: Vitals:   01/10/20 0940 01/10/20 0947 01/10/20 1040 01/10/20 1237  BP: 132/85 (!) 141/84 130/78 108/78  Pulse: 75 69 64 76  Resp: (!) 26 (!) 22 13 15   Temp:    98.8 F (37.1 C)  TempSrc:    Oral  SpO2: 97% 98%  98%  Weight:      Height:  Intake/Output Summary (Last 24 hours) at 01/10/2020 1435 Last data filed at 01/10/2020 0352 Gross per 24 hour  Intake 720 ml  Output 400 ml  Net 320 ml   Filed Weights   01/07/20 0659  Weight: 99.8 kg   Examination: Physical Exam:  Constitutional: WN/WD overweight African-American male currently in NAD and appears calm but does appear uncomfortable Eyes: Lids and conjunctivae normal, sclerae anicteric  ENMT: External Ears, Nose appear normal. Grossly normal hearing.  Neck: Appears normal,  supple, no cervical masses, normal ROM, no appreciable thyromegaly: No JVD Respiratory: Mildly diminished to auscultation bilaterally, no wheezing, rales, rhonchi or crackles. Normal respiratory effort and patient is not tachypenic. No accessory muscle use.  Unlabored breathing  Cardiovascular: RRR, no murmurs / rubs / gallops. S1 and S2 auscultated. No extremity edema.  Abdomen: Soft, non-tender, distended secondary body habitus. Bowel sounds positive.  GU: Deferred. Musculoskeletal: No clubbing / cyanosis of digits/nails. No joint deformity upper and lower extremities.  Skin: No rashes, lesions, ulcers on limited skin evaluation. No induration; Warm and dry.  Neurologic: CN 2-12 grossly intact with no focal deficits. Romberg sign and cerebellar reflexes not assessed.  Psychiatric: Normal judgment and insight. Alert and oriented x 3. Mildly depressed appearing mood and appropriate affect.   Data Reviewed: I have personally reviewed following labs and imaging studies  CBC: Recent Labs  Lab 01/07/20 0703 01/09/20 0429 01/10/20 0517  WBC 14.3* 9.2 9.0  NEUTROABS 12.6*  --  5.9  HGB 14.8 14.2 13.8  HCT 44.7 42.4 41.0  MCV 88.9 88.1 87.8  PLT 314 256 301   Basic Metabolic Panel: Recent Labs  Lab 01/07/20 0703 01/07/20 1644 01/09/20 0429 01/10/20 0517  NA 140  --  140 138  K 4.0  --  3.4* 3.9  CL 102  --  107 108  CO2 23  --  24 22  GLUCOSE 134*  --  98 101*  BUN 8  --  9 10  CREATININE 1.19  --  0.88 0.92  CALCIUM 9.6  --  8.9 8.8*  MG  --  2.1  --  2.1  PHOS  --  3.3  --  3.6   GFR: Estimated Creatinine Clearance: 129.3 mL/min (by C-G formula based on SCr of 0.92 mg/dL). Liver Function Tests: Recent Labs  Lab 01/07/20 0703 01/10/20 0517  AST 43* 43*  ALT 31 33  ALKPHOS 85 64  BILITOT 0.8 0.5  PROT 7.5 6.4*  ALBUMIN 4.2 3.2*   No results for input(s): LIPASE, AMYLASE in the last 168 hours. No results for input(s): AMMONIA in the last 168 hours. Coagulation  Profile: Recent Labs  Lab 01/07/20 0703  INR 1.0   Cardiac Enzymes: Recent Labs  Lab 01/07/20 0703  CKTOTAL 787*   BNP (last 3 results) No results for input(s): PROBNP in the last 8760 hours. HbA1C: Recent Labs    01/08/20 0450  HGBA1C 5.6   CBG: No results for input(s): GLUCAP in the last 168 hours. Lipid Profile: Recent Labs    01/08/20 0450  CHOL 204*  HDL 47  LDLCALC 138*  TRIG 94  CHOLHDL 4.3   Thyroid Function Tests: Recent Labs    01/07/20 1644  TSH 0.501   Anemia Panel: Recent Labs    01/08/20 1028  VITAMINB12 362   Sepsis Labs: Recent Labs  Lab 01/07/20 0811  LATICACIDVEN 1.9    Recent Results (from the past 240 hour(s))  Blood culture (routine x 2)  Status: None (Preliminary result)   Collection Time: 01/07/20  8:07 AM   Specimen: BLOOD RIGHT ARM  Result Value Ref Range Status   Specimen Description BLOOD RIGHT ARM  Final   Special Requests   Final    BOTTLES DRAWN AEROBIC AND ANAEROBIC Blood Culture adequate volume   Culture   Final    NO GROWTH 3 DAYS Performed at [facility], [address], [place], [place] [address]    Report Status PENDING  Incomplete  SARS CORONAVIRUS 2 (TAT 6-24 HRS) Nasopharyngeal Nasopharyngeal Swab     Status: None   Collection Time: 01/07/20  9:34 AM   Specimen: Nasopharyngeal Swab  Result Value Ref Range Status   SARS Coronavirus 2 NEGATIVE NEGATIVE Final    Comment: (NOTE) SARS-CoV-2 target nucleic acids are NOT DETECTED. The SARS-CoV-2 RNA is generally detectable in upper and lower respiratory specimens during the acute phase of infection. Negative results do not preclude SARS-CoV-2 infection, do not rule out co-infections with other pathogens, and should not be used as the sole basis for treatment or other patient management decisions. Negative results must be combined with clinical observations, patient history, and epidemiological information. The expected result is Negative. Fact  Sheet for Patients: SugarRoll.be Fact Sheet for Healthcare Providers: https://www.woods-mathews.com/ This test is not yet approved or cleared by the [place] FDA and  has been authorized for detection and/or diagnosis of SARS-CoV-2 by FDA under an Emergency Use Authorization (EUA). This EUA will remain  in effect (meaning this test can be used) for the duration of the COVID-19 declaration under Section 56 4(b)(1) of the Act, 21 U.S.C. section 360bbb-3(b)(1), unless the authorization is terminated or revoked sooner. Performed at [facility], [place] [address] [place], [place] [address]   Urine culture     Status: None   Collection Time: 01/07/20 10:47 AM   Specimen: In/Out Cath Urine  Result Value Ref Range Status   Specimen Description IN/OUT CATH URINE  Final   Special Requests NONE  Final   Culture   Final    NO GROWTH Performed at [facility], [place] [address], [place], [place] [address]    Report Status 01/08/2020 FINAL  Final     RN Pressure Injury Documentation:     Estimated body mass index is 28.25 kg/m as calculated from the following:   Height as of this encounter: 6' 2"  (1.88 m).   Weight as of this encounter: 99.8 kg.  Malnutrition Type:  Nutrition Problem: Predicted suboptimal nutrient intake Etiology: decreased appetite   Malnutrition Characteristics:  Signs/Symptoms: other (comment)(consult s/p stroke)   Nutrition Interventions:  Interventions: Ensure Enlive (each supplement provides 350kcal and 20 grams of protein), MVI   Radiology Studies: CT HEAD WO CONTRAST  Result Date: 01/09/2020 CLINICAL DATA:  Follow-up examination for acute stroke. EXAM: CT HEAD WITHOUT CONTRAST TECHNIQUE: Contiguous axial images were obtained from the base of the skull through the vertex without intravenous contrast. COMPARISON:  Prior MRI from 01/07/2020. FINDINGS: Brain: Evolving cytotoxic edema involving the right  frontal lobe is seen, with involvement of primarily the right MCA distribution, although possible involvement of right ACA territory present as well. Overall, distribution similar to previous MRI. Evidence for hemorrhagic transformation with blood products seen at the right caudate and lentiform nuclei, also similar. Associated regional mass effect with up to 3 mm of right-to-left shift. Right lateral ventricle partially effaced. No hydrocephalus or ventricular trapping. Basilar cisterns remain patent. No new large vessel  territory infarct or intracranial hemorrhage. No extra-axial fluid collection. No mass lesion. Vascular: No hyperdense vessel. Skull: Scalp soft tissues and calvarium demonstrate no new finding. Sinuses/Orbits: Globes and orbital soft tissues demonstrate no acute finding. Left greater than right sphenoid sinus disease. Paranasal sinuses are otherwise largely clear. Trace left mastoid effusion noted. Other: None. IMPRESSION: 1. Evolving right cerebral infarct with evidence for hemorrhagic transformation at the right basal ganglia, similar in appearance and distribution as compared to previous MRI. Associated mass effect with 3 mm of right-to-left shift. No hydrocephalus or ventricular trapping. 2. No other new acute intracranial abnormality. Electronically Signed   By: Jeannine Boga M.D.   On: 01/09/2020 03:18   ECHO TEE  Result Date: 01/10/2020    TRANSESOPHOGEAL ECHO REPORT   Patient Name:   Derek [name] Date of Exam: 01/10/2020 Medical Rec #:  [id number]        Height:       74.0 in Accession #:    [id number]       Weight:       220.0 lb Date of Birth:  July 19, 1976         BSA:          2.263 m Patient Age:    44 years         BP:           125/82 mmHg Patient Gender: M                HR:           82 bpm. Exam Location:  Inpatient Procedure: 2D Echo Indications:    stroke 434.91  History:        Patient has prior history of Echocardiogram examinations, most                 recent  01/08/2020. Stroke; Risk Factors:Current Smoker. ETOH &                 Tobacco use.  Sonographer:    Jannett Celestine RDCS (AE) Referring Phys: [id number] CHRISTOPHER RONALD BERGE PROCEDURE: After discussion of the risks and benefits of a TEE, an informed consent was obtained from the patient. TEE procedure time was 15 minutes. The transesophogeal probe was passed without difficulty through the esophogus of the patient. Imaged were obtained with the patient in a left lateral decubitus position. Local oropharyngeal anesthetic was provided with viscous lidocaine. Sedation performed by different physician. The patient was monitored while under deep sedation. Anesthestetic sedation was provided intravenously by Anesthesiology: 240m of Propofol. Image quality was excellent. The patient's vital signs; including heart rate, blood pressure, and oxygen saturation; remained stable throughout the procedure. The patient developed  no complications during the procedure. IMPRESSIONS  1. Left ventricular ejection fraction, by estimation, is 60 to 65%. The left ventricle has normal function. The left ventricle has no regional wall motion abnormalities.  2. Right ventricular systolic function is normal. The right ventricular size is normal.  3. No left atrial/left atrial appendage thrombus was detected. The LAA emptying velocity was 87 cm/s.  4. Redundant chordal tissue present. The mitral valve is normal in structure. Trivial mitral valve regurgitation. No evidence of mitral stenosis.  5. The aortic valve is tricuspid. Aortic valve regurgitation is not visualized. No aortic stenosis is present.  6. Agitated saline contrast bubble study was negative, with no evidence of any interatrial shunt. Conclusion(s)/Recommendation(s): Normal biventricular function without evidence of hemodynamically significant valvular heart disease. No LA/LAA thrombus identified.  Negative bubble study for interatrial shunt. No intracardiac source of embolism  detected  on this on this transesophageal echocardiogram. FINDINGS  Left Ventricle: Left ventricular ejection fraction, by estimation, is 60 to 65%. The left ventricle has normal function. The left ventricle has no regional wall motion abnormalities. The left ventricular internal cavity size was normal in size. There is  no left ventricular hypertrophy. Right Ventricle: The right ventricular size is normal. No increase in right ventricular wall thickness. Right ventricular systolic function is normal. Left Atrium: Left atrial size was normal in size. No left atrial/left atrial appendage thrombus was detected. The LAA emptying velocity was 87 cm/s. Right Atrium: Right atrial size was normal in size. Pericardium: Trivial pericardial effusion is present. The pericardial effusion is circumferential. Mitral Valve: Redundant chordal tissue present. The mitral valve is normal in structure. Trivial mitral valve regurgitation. No evidence of mitral valve stenosis. Tricuspid Valve: The tricuspid valve is normal in structure. Tricuspid valve regurgitation is trivial. No evidence of tricuspid stenosis. Aortic Valve: The aortic valve is tricuspid. Aortic valve regurgitation is not visualized. No aortic stenosis is present. Pulmonic Valve: The pulmonic valve was normal in structure. Pulmonic valve regurgitation is trivial. No evidence of pulmonic stenosis. Aorta: The aortic root, ascending aorta and aortic arch are all structurally normal, with no evidence of dilitation or obstruction. Venous: The right upper pulmonary vein and left upper pulmonary vein are normal. IAS/Shunts: No atrial level shunt detected by color flow Doppler. Agitated saline contrast was given intravenously to evaluate for intracardiac shunting. Agitated saline contrast bubble study was negative, with no evidence of any interatrial shunt. EKG: Rhythm strip during this exam demostrated normal sinus rhythm.   AORTA Ao Root diam: 3.60 cm Ao Asc diam:  3.00 cm  TRICUSPID VALVE TR Peak grad:   9.0 mmHg TR Vmax:        150.00 cm/s Eleonore Chiquito MD Electronically signed by Eleonore Chiquito MD Signature Date/Time: 01/10/2020/10:16:22 AM    Final    Scheduled Meds: . amLODipine  10 mg Oral Daily  . aspirin  300 mg Rectal Daily   Or  . aspirin  325 mg Oral Daily  . atorvastatin  40 mg Oral q1800  . feeding supplement (ENSURE ENLIVE)  237 mL Oral BID BM  . folic acid  1 mg Oral Daily  . labetalol  20 mg Intravenous Once  . metoprolol tartrate  5 mg Intravenous Q12H  . multivitamin with minerals  1 tablet Oral Daily  . nicotine  7 mg Transdermal Daily  . thiamine  100 mg Oral Daily   Or  . thiamine  100 mg Intravenous Daily   Continuous Infusions: . sodium chloride 50 mL/hr at 01/09/20 1343    LOS: 3 days   Kerney Elbe, DO [facility] PAGER is on [place]  If 7PM-7AM, please contact night-coverage [email or web address]

## 2020-01-11 ENCOUNTER — Encounter: Payer: Self-pay | Admitting: *Deleted

## 2020-01-11 DIAGNOSIS — R296 Repeated falls: Secondary | ICD-10-CM

## 2020-01-11 LAB — COMPREHENSIVE METABOLIC PANEL
ALT: 33 U/L (ref 0–44)
AST: 46 U/L — ABNORMAL HIGH (ref 15–41)
Albumin: 3.4 g/dL — ABNORMAL LOW (ref 3.5–5.0)
Alkaline Phosphatase: 67 U/L (ref 38–126)
Anion gap: 14 (ref 5–15)
BUN: 11 mg/dL (ref 6–20)
CO2: 21 mmol/L — ABNORMAL LOW (ref 22–32)
Calcium: 9.2 mg/dL (ref 8.9–10.3)
Chloride: 103 mmol/L (ref 98–111)
Creatinine, Ser: 0.89 mg/dL (ref 0.61–1.24)
GFR calc Af Amer: >60 mL/min (ref >60)
GFR calc non Af Amer: >60 mL/min (ref >60)
Glucose, Bld: 84 mg/dL (ref 70–99)
Potassium: 4.1 mmol/L (ref 3.5–5.1)
Sodium: 138 mmol/L (ref 135–145)
Total Bilirubin: 0.8 mg/dL (ref 0.3–1.2)
Total Protein: 6.8 g/dL (ref 6.5–8.1)

## 2020-01-11 LAB — CBC WITH DIFFERENTIAL/PLATELET
Abs Immature Granulocytes: 0.04 10*3/uL (ref 0.00–0.07)
Basophils Absolute: 0 10*3/uL (ref 0.0–0.1)
Basophils Relative: 0 %
Eosinophils Absolute: 0.2 10*3/uL (ref 0.0–0.5)
Eosinophils Relative: 2 %
HCT: 42.8 % (ref 39.0–52.0)
Hemoglobin: 14 g/dL (ref 13.0–17.0)
Immature Granulocytes: 0 %
Lymphocytes Relative: 17 %
Lymphs Abs: 1.7 10*3/uL (ref 0.7–4.0)
MCH: 29.2 pg (ref 26.0–34.0)
MCHC: 32.7 g/dL (ref 30.0–36.0)
MCV: 89.2 fL (ref 80.0–100.0)
Monocytes Absolute: 1 10*3/uL (ref 0.1–1.0)
Monocytes Relative: 10 %
Neutro Abs: 6.9 10*3/uL (ref 1.7–7.7)
Neutrophils Relative %: 71 %
Platelets: 267 10*3/uL (ref 150–400)
RBC: 4.8 MIL/uL (ref 4.22–5.81)
RDW: 17.2 % — ABNORMAL HIGH (ref 11.5–15.5)
WBC: 9.8 10*3/uL (ref 4.0–10.5)
nRBC: 0 % (ref 0.0–0.2)

## 2020-01-11 LAB — MPO/PR-3 (ANCA) ANTIBODIES
ANCA Proteinase 3: <3.5 U/mL (ref 0.0–3.5)
Myeloperoxidase Abs: <9 U/mL (ref 0.0–9.0)

## 2020-01-11 LAB — PHOSPHORUS: Phosphorus: 4.1 mg/dL (ref 2.5–4.6)

## 2020-01-11 LAB — MAGNESIUM: Magnesium: 2.2 mg/dL (ref 1.7–2.4)

## 2020-01-11 NOTE — Progress Notes (Signed)
Inpatient Rehab Admissions Coordinator:   Met with pt and his sister at the bedside to update on insurance barrier.  Will continue to follow for the weekend, and hopefully will have more information regarding coverages by next week.   Shann Medal, PT, DPT Admissions Coordinator [phone] 01/11/20  1:19 PM

## 2020-01-11 NOTE — Progress Notes (Signed)
PROGRESS NOTE    Derek [name]  [name] DOB: 1975-11-08 DOA: 01/07/2020 PCP: [name], MD   Brief Narrative:  HPI per Dr. [name] on 01/07/20 Derek [name] is a 44 y.o. male with no known medical history presenting as Code Stroke.  The patient is mostly sleeping and awakes and appears appropriate but his conversation doesn't exactly make sense.  He complains of being cold - when I asked him to straighten his right leg out (it was behind his left leg), he says he was "just putting it behind this thing to keep it warm" and seems unaware completely of his left side.  He reports that he needs to call his work so he doesn't get fired, but then didn't actually want to use the phone. He reports headache.  I called and spoke with his sister.  She was notified this AM that he was taken here.  She reports that he went out Saturday night to a family member's birthday party; they took him home and he was very intoxicated.  He was placed on the bed after 0200.  His girlfriend noted him on the floor sometime later.  She found him there again sometime later and had someone help him into the bed.  He was not moving on one side.  He apparently fell back on the floor at some point.  This AM, she finally called 911.  He has chronic back pain from a remote accident.  He has been under a lot of stress - including with his live-in girlfriend.  He also has a 36yo son.   ED Course:  EMS called for a fall, noted left-sided weakness, facial droop, slurred speech, rightward gaze.  CT consistent with R MCA CVA.  Neuro has seen, MRI recommended.  Temp 100.5, given empiric abx, no symptoms or apparent source.  **Interim History Currently being worked up for stroke and currently there is no definitive cause so patient undergoing a TEE which was done this morning and showed no LA/LAA thrombus and no PFO by color bubble study in the normal left ventricular EF of 60 to 65% with no significant valvular heart  disease.  He did have a hemorrhagic conversion and neurology does recommend no dual antiplatelet therapy because of the hemorrhagic conversion.  Currently on aspirin monotherapy for now.  PT/OT recommending CIR at discharge and currently after discussion with the case manager and the social worker patient cannot be discharged until at least Monday until insurance is obtained and currently does not have safe disposition otherwise she will need to remain in the hospital for now.  Assessment & Plan:   Principal Problem:   Acute ischemic right MCA stroke (HCC) Active Problems:   Tobacco dependence   Marijuana dependence ([place])   Alcohol dependence ([place])   Cerebrovascular accident (CVA) due to thrombosis of right middle cerebral artery (HCC)   Chronic pain syndrome   Hypokalemia   Dysphagia, post-stroke  Acute CVA with Cerebral Edema with Hemorrhagic conversion of the CVA -Patient was last seen normal at approximately 2 AM on Sunday morning, and has had recurrent falls; patient fell again yesterday -With significant left-sided deficits and cognitive impairment, as well as lethargy on examination which is improved since yesterday -CT head: Recent M1 segment, right middle cerebral artery infarct with mass-effect and possible petechial hemorrhage in portions of the right basal ganglia.  Proximal extent of postero superior right frontal and adjacent right parietal lobes. -CTA neck: neck no large vessel occlusion or significant  stenosis -MRI head showed acute right MCA territory going basal ganglia and right parietal lobe.  Hemorrhagic transformation of the right basal ganglia infarct with mass-effect on the lateral ventricle -Echocardiogram showed EF of 26-83%, LV diastolic parameters normal. -Lower extremity Doppler showed no evidence of DVT -Carotid doppler: B/L 1-39% ICA stenosis.  Bilateral vertebral arteries demonstrate antegrade flow -LDL 138, hemoglobin A1c 5.6 -Full lipid panel showed a total  cholesterol/HDL ratio of 4.3, cholesterol level 204, HDL 47, LDL 138, triglycerides of 94, VLDL 19 -Continue with Atorvastatin 40 mg p.o. daily -Neurology had considered 3% saline but not needed at this time and repeat CT 3/11 showed "Evolving cerebral infarction with hemorrhagic conversion and mild leftward midline shift. No new hemorrhage, hydrocephalus, or worsening mass effect." -Repeat head CT scan 3/10 showed "Evolving right cerebral infarct with evidence for hemorrhagic transformation at the right basal ganglia, similar in appearance and distribution as compared to previous MRI. Associated mass effect with 3 mm of right-to-left shift. No hydrocephalus or ventricular trapping. 2. No other new acute intracranial abnormality" -Neurology consulted and appreciated' -Dr. Erlinda Hong recommends TEE which was done today and showed no LAA/LAA thrombus and no PFO by color bubble study.  The left ventricular EF was normal with the ejection fraction of 60 to 65% and there is no significant valvular heart disease noted -Hypercoagulable work-up ordered and his PTT lupus anticoagulant, DRVVT was negative and his ANA antibody was negative -Continue Aspirin 325 mg daily for now.  Holding DAPT given his hemorrhagic transformation. -Neurology recommending now blood pressure less than 160 given his hemorrhagic conversion the long-term blood pressure goal normotensive -PT and OT recommending CIR and CIR was consulted for further evaluation recommendations -SLP evaluated and recommended dysphagia 2 diet with nectar thick liquids and are going to defer recommending instrumental swallow assessment until after the TEE; SLP evaluated and the patient now on a dysphagia 3 mechanical soft diet with nectar thick liquids -Continue monitor and follow-up on SLP recommendations -CIR Physician Dr. Posey Pronto was consulted and feels that the patient would be a good candidate for CIR -Patient is to be discharged to CIR however unfortunately  cannot go until at least Monday per my discussion with the transition of care team given the patient's insurance and that he has no other safe discharge disposition at this time he will need to remain in the hospital until that timeframe. -Neurology has signed off the case now and recommends a 30-day CardioNet monitor as an outpatient to rule out if atrial fibrillation -Dr. Erlinda Hong recommends follow-up with the stroke clinic at [facility] 4 weeks after discharge from rehab  Essential Hypertension -Placed on Amlodipine 10 mg po Daily  -Currently  Was on metoprolol tartrate 5 mg IV every 12 but will stop -BP goal less than 160 given hemorrhagic conversion -He was ordered labetalol 20 mg IV once but was not given due to parameters not being met -BP is now 124/83  Alcohol Dependence -Continue CIWA protocol with IV lorazepam -No signs of withdrawal -Continue Folic acid, Multivitamin + Minerals, and Thiamine supplementation  Marijuana Dependence -UDS positive for THC -Continue with Cessation efforts   Tobacco Dependence -Continue Nicotine 7 mg TD Patch -Smoking Cessation Counseling given    SIRS with a Recurrent Fever -Patient with leukocytosis of 14.3 on admission and temperature of 100.5 F but this is now improved as his WBC is 9.8; patient has been afebrile for last 24 to 48 hours with a T-max of 100.6 -UA unremarkable for infection,  urine culture shows no growth -Blood cultures pending showed no growth to date at 4 days -Suspect secondary to the above but if continues to spike temperatures again we will panculture again -Continue to monitor and trend temperature curve  Hypokalemia -Improved as patient's potassium is now 4.1 -Continue to Monitor and Replete as Necessary -Repeat CMP in AM   HLD -Full lipid panel showed a total cholesterol/HDL ratio of 4.3, cholesterol level 204, HDL 47, LDL 138, triglycerides of 94, VLDL 19 -His ALT was mildly elevated at 43  yesterday and today it was 46 and will need to continue to monitor -Continue with Atorvastatin 40 mg p.o. daily  Elevated ALT -Mild at 43 yesterday and today was 46 -Continue monitor and trend hepatic function panel -If worsening or not improving will need to obtain a right upper quadrant ultrasound and acute hepatitis panel -Repeat CMP in the a.m.  Headache -Secondary to above -Not amenable acetaminophen so we will try oxycodone as we will try and avoid NSAIDs due to hemorrhagic conversion  Unwitnessed Fall -Patient fell getting out of chair and hit his Head per Nursing yesterady  -Ordered Stat Head CT w/o Contrast as he has had a CVA with Hemorrhagic Conversion; Repeat CT showed "Evolving cerebral infarction with hemorrhagic conversion and mild leftward midline shift. No new hemorrhage, hydrocephalus, or worsening mass effect." -Notified Neurology and had no additional Recommendations -C/w Fall Precautions   Fever -Mild Temperature of 100.6 Last night and had a temperature of 100.5 on admission -Patient is Afebrile now -Continue to Monitor for S/Sx of Infection -If Spikes a Fever again will  -Blood Cx from 01/07/20 showed NGTD at 4 Days -Repeat blood cultures -We will obtain a chest x-ray in the a.m. -Currently he has no leukocytosis and WBC is 9.8 Continue monitor and trend temperature curve and WBC  DVT prophylaxis: SCDs given his hemorrhagic Transformation but now he is back on enoxaparin 40 mg subcu every 24 after Neurology is restarted this. Code Status: FULL CODE  Family Communication: No family present at bedside Disposition Plan: Patient is from home and had a CVA which then had hemorrhagic conversion and requires CIR at discharge.  Will need to be fully worked up from a neurological standpoint and requires a TEE prior to discharge to [place] and likely this will be done tomorrow.  Anticipate discharging in the next 24 to 48 hours pending neurological clearance   Consultants:    Neurology  Cardiology for TEE    Procedures:  ECHOCARDIOGRAM IMPRESSIONS    1. Left ventricular ejection fraction, by estimation, is 60 to 65%. The  left ventricle has normal function. The left ventricle has no regional  wall motion abnormalities. Left ventricular diastolic parameters were  normal.  2. Right ventricular systolic function is normal. The right ventricular  size is normal.  3. The mitral valve is normal in structure. No evidence of mitral valve  regurgitation. No evidence of mitral stenosis.  4. The aortic valve is normal in structure. Aortic valve regurgitation is  not visualized. No aortic stenosis is present.  5. The inferior vena cava is normal in size with greater than 50%  respiratory variability, suggesting right atrial pressure of 3 mmHg.   FINDINGS  Left Ventricle: Left ventricular ejection fraction, by estimation, is 60  to 65%. The left ventricle has normal function. The left ventricle has no  regional wall motion abnormalities. The left ventricular internal cavity  size was normal in size. There is  no left ventricular hypertrophy.  Left ventricular diastolic parameters  were normal. Normal left ventricular filling pressure.   Right Ventricle: The right ventricular size is normal. No increase in  right ventricular wall thickness. Right ventricular systolic function is  normal.   Left Atrium: Left atrial size was normal in size.   Right Atrium: Right atrial size was normal in size.   Pericardium: There is no evidence of pericardial effusion.   Mitral Valve: The mitral valve is normal in structure. Normal mobility of  the mitral valve leaflets. No evidence of mitral valve regurgitation. No  evidence of mitral valve stenosis.   Tricuspid Valve: The tricuspid valve is normal in structure. Tricuspid  valve regurgitation is not demonstrated. No evidence of tricuspid  stenosis.   Aortic Valve: The aortic valve is normal in structure. Aortic  valve  regurgitation is not visualized. No aortic stenosis is present.   Pulmonic Valve: The pulmonic valve was normal in structure. Pulmonic valve  regurgitation is not visualized. No evidence of pulmonic stenosis.   Aorta: The aortic root is normal in size and structure.   Venous: The inferior vena cava is normal in size with greater than 50%  respiratory variability, suggesting right atrial pressure of 3 mmHg.   IAS/Shunts: No atrial level shunt detected by color flow Doppler.     LEFT VENTRICLE  PLAX 2D  LVIDd:     5.40 cm Diastology  LVIDs:     3.20 cm LV e' lateral:  12.30 cm/s  LV PW:     1.00 cm LV E/e' lateral: 4.9  LV IVS:    0.90 cm LV e' medial:  10.20 cm/s  LVOT diam:   2.20 cm LV E/e' medial: 5.9  LV SV:     61  LV SV Index:  27  LVOT Area:   3.80 cm     RIGHT VENTRICLE  RV S prime:   12.50 cm/s  TAPSE (M-mode): 2.2 cm   LEFT ATRIUM       Index    RIGHT ATRIUM      Index  LA diam:    3.40 cm 1.50 cm/m RA Area:   14.80 cm  LA Vol (A2C):  46.5 ml 20.55 ml/m RA Volume:  33.20 ml 14.67 ml/m  LA Vol (A4C):  20.4 ml 9.01 ml/m  LA Biplane Vol: 30.8 ml 13.61 ml/m  AORTIC VALVE  LVOT Vmax:  86.80 cm/s  LVOT Vmean: 51.000 cm/s  LVOT VTI:  0.161 m    AORTA  Ao Root diam: 3.10 cm   MITRAL VALVE  MV Area (PHT): 3.31 cm  SHUNTS  MV Decel Time: 229 msec  Systemic VTI: 0.16 m  MV E velocity: 60.40 cm/s Systemic Diam: 2.20 cm  MV A velocity: 52.70 cm/s  MV E/A ratio: 1.15   LOWER EXTREMITY DUPLEX RIGHT:  - There is no evidence of deep vein thrombosis in the lower extremity.    - No cystic structure found in the popliteal fossa.    LEFT:  - There is no evidence of deep vein thrombosis in the lower extremity.    - No cystic structure found in the popliteal fossa.   TEE 01/10/20 1. No LAA/LA thrombus.  2. No PFO by color or bubble study.  3. LVEF normal 60-65%. 4. No  significant valvular heart disease.   IMPRESSIONS    1. Left ventricular ejection fraction, by estimation, is 60 to 65%. The  left ventricle has normal function. The left ventricle has no regional  wall motion  abnormalities.  2. Right ventricular systolic function is normal. The right ventricular  size is normal.  3. No left atrial/left atrial appendage thrombus was detected. The LAA  emptying velocity was 87 cm/s.  4. Redundant chordal tissue present. The mitral valve is normal in  structure. Trivial mitral valve regurgitation. No evidence of mitral  stenosis.  5. The aortic valve is tricuspid. Aortic valve regurgitation is not  visualized. No aortic stenosis is present.  6. Agitated saline contrast bubble study was negative, with no evidence  of any interatrial shunt.   Conclusion(s)/Recommendation(s): Normal biventricular function without  evidence of hemodynamically significant valvular heart disease. No LA/LAA  thrombus identified. Negative bubble study for interatrial shunt. No  intracardiac source of embolism detected  on this on this transesophageal echocardiogram.   FINDINGS  Left Ventricle: Left ventricular ejection fraction, by estimation, is 60  to 65%. The left ventricle has normal function. The left ventricle has no  regional wall motion abnormalities. The left ventricular internal cavity  size was normal in size. There is  no left ventricular hypertrophy.   Right Ventricle: The right ventricular size is normal. No increase in  right ventricular wall thickness. Right ventricular systolic function is  normal.   Left Atrium: Left atrial size was normal in size. No left atrial/left  atrial appendage thrombus was detected. The LAA emptying velocity was 87  cm/s.   Right Atrium: Right atrial size was normal in size.   Pericardium: Trivial pericardial effusion is present. The pericardial  effusion is circumferential.   Mitral Valve: Redundant chordal  tissue present. The mitral valve is normal  in structure. Trivial mitral valve regurgitation. No evidence of mitral  valve stenosis.   Tricuspid Valve: The tricuspid valve is normal in structure. Tricuspid  valve regurgitation is trivial. No evidence of tricuspid stenosis.   Aortic Valve: The aortic valve is tricuspid. Aortic valve regurgitation is  not visualized. No aortic stenosis is present.   Pulmonic Valve: The pulmonic valve was normal in structure. Pulmonic valve  regurgitation is trivial. No evidence of pulmonic stenosis.   Aorta: The aortic root, ascending aorta and aortic arch are all  structurally normal, with no evidence of dilitation or obstruction.   Venous: The right upper pulmonary vein and left upper pulmonary vein are  normal.   IAS/Shunts: No atrial level shunt detected by color flow Doppler. Agitated  saline contrast was given intravenously to evaluate for intracardiac  shunting. Agitated saline contrast bubble study was negative, with no  evidence of any interatrial shunt.   EKG: Rhythm strip during this exam demostrated normal sinus rhythm.       AORTA  Ao Root diam: 3.60 cm  Ao Asc diam: 3.00 cm   TRICUSPID VALVE  TR Peak grad:  9.0 mmHg  TR Vmax:    150.00 cm/s      Antimicrobials: Anti-infectives (From admission, onward)   Start     Dose/Rate Route Frequency Ordered Stop   01/07/20 2200  vancomycin (VANCOREADY) IVPB 1500 mg/300 mL  Status:  Discontinued     1,500 mg 150 mL/hr over 120 Minutes Intravenous Every 12 hours 01/07/20 0834 01/07/20 1332   01/07/20 1400  ceFEPIme (MAXIPIME) 2 g in sodium chloride 0.9 % 100 mL IVPB  Status:  Discontinued     2 g 200 mL/hr over 30 Minutes Intravenous Every 8 hours 01/07/20 0834 01/07/20 1332   01/07/20 0830  ceFEPIme (MAXIPIME) 2 g in sodium chloride 0.9 % 100 mL IVPB  2 g 200 mL/hr over 30 Minutes Intravenous  Once 01/07/20 0826 01/07/20 0932   01/07/20 0830  metroNIDAZOLE (FLAGYL) IVPB  500 mg     500 mg 100 mL/hr over 60 Minutes Intravenous  Once 01/07/20 0826 01/07/20 1041   01/07/20 0830  vancomycin (VANCOCIN) IVPB 1000 mg/200 mL premix  Status:  Discontinued     1,000 mg 200 mL/hr over 60 Minutes Intravenous  Once 01/07/20 0826 01/07/20 0828   01/07/20 0830  vancomycin (VANCOREADY) IVPB 2000 mg/400 mL     2,000 mg 200 mL/hr over 120 Minutes Intravenous  Once 01/07/20 0828 01/07/20 1135     Subjective: Seen and examined at bedside and he had wet his bed and was awaiting to be cleaned up.  No nausea or vomiting however states that he had a extremely terrible night.  Tells me that he fell yesterday and hit his head.  I asked him if his head was hurting he said a little bit.  This is appetite is also "so-so.  No nausea or vomiting.  Was febrile last night but has no fevers currently.  No other concerns or complaints at this time.  Objective: Vitals:   01/11/20 0359 01/11/20 0406 01/11/20 0500 01/11/20 0843  BP:  112/82  124/83  Pulse:  (!) 59  76  Resp:  20 20 18   Temp: 98.1 F (36.7 C)   98.7 F (37.1 C)  TempSrc: Oral   Oral  SpO2:  100%  95%  Weight:      Height:        Intake/Output Summary (Last 24 hours) at 01/11/2020 1204 Last data filed at 01/11/2020 9480 Gross per 24 hour  Intake 760 ml  Output 250 ml  Net 510 ml   Filed Weights   01/07/20 0659  Weight: 99.8 kg   Examination: Physical Exam:  Constitutional: WN/WD overweight African-American male currently in NAD and appears calm and but does appear uncomfortable again Eyes: Lids and conjunctivae normal, sclerae anicteric  ENMT: External Ears, Nose appear normal. Grossly normal hearing.  Does have a left facial droop Neck: Appears normal, supple, no cervical masses, normal ROM, no appreciable thyromegaly: No JVD Respiratory: Diminished to auscultation bilaterally, no wheezing, rales, rhonchi or crackles. Normal respiratory effort and patient is not tachypenic. No accessory muscle use.  Unlabored  breathing Cardiovascular: RRR, no murmurs / rubs / gallops. S1 and S2 auscultated. No extremity edema. Abdomen: Soft, non-tender, distended secondary body habitus. No masses palpated. No appreciable hepatosplenomegaly. Bowel sounds positive.  GU: Deferred. Musculoskeletal: No clubbing / cyanosis of digits/nails. No joint deformity upper and lower extremities.  Skin: No rashes, lesions, ulcers. No induration; Warm and dry.  Neurologic: CN 2-12 grossly intact with no focal deficits except that he does have a facial droop on the left.  Has left hemiplegia and is unable to move his left arm or his left leg Psychiatric: Normal judgment and insight. Alert and oriented x 3.  Slightly depressed appearing has a flat affect mood and appropriate affect.   Data Reviewed: I have personally reviewed following labs and imaging studies  CBC: Recent Labs  Lab 01/07/20 0703 01/09/20 0429 01/10/20 0517 01/11/20 0415  WBC 14.3* 9.2 9.0 9.8  NEUTROABS 12.6*  --  5.9 6.9  HGB 14.8 14.2 13.8 14.0  HCT 44.7 42.4 41.0 42.8  MCV 88.9 88.1 87.8 89.2  PLT 314 256 233 165   Basic Metabolic Panel: Recent Labs  Lab 01/07/20 0703 01/07/20 1644 01/09/20 0429 01/10/20 0517  01/11/20 0415  NA 140  --  140 138 138  K 4.0  --  3.4* 3.9 4.1  CL 102  --  107 108 103  CO2 23  --  24 22 21*  GLUCOSE 134*  --  98 101* 84  BUN 8  --  9 10 11   CREATININE 1.19  --  0.88 0.92 0.89  CALCIUM 9.6  --  8.9 8.8* 9.2  MG  --  2.1  --  2.1 2.2  PHOS  --  3.3  --  3.6 4.1   GFR: Estimated Creatinine Clearance: 133.6 mL/min (by C-G formula based on SCr of 0.89 mg/dL). Liver Function Tests: Recent Labs  Lab 01/07/20 0703 01/10/20 0517 01/11/20 0415  AST 43* 43* 46*  ALT 31 33 33  ALKPHOS 85 64 67  BILITOT 0.8 0.5 0.8  PROT 7.5 6.4* 6.8  ALBUMIN 4.2 3.2* 3.4*   No results for input(s): LIPASE, AMYLASE in the last 168 hours. No results for input(s): AMMONIA in the last 168 hours. Coagulation Profile: Recent Labs   Lab 01/07/20 0703  INR 1.0   Cardiac Enzymes: Recent Labs  Lab 01/07/20 0703  CKTOTAL 787*   BNP (last 3 results) No results for input(s): PROBNP in the last 8760 hours. HbA1C: No results for input(s): HGBA1C in the last 72 hours. CBG: No results for input(s): GLUCAP in the last 168 hours. Lipid Profile: No results for input(s): CHOL, HDL, LDLCALC, TRIG, CHOLHDL, LDLDIRECT in the last 72 hours. Thyroid Function Tests: No results for input(s): TSH, T4TOTAL, FREET4, T3FREE, THYROIDAB in the last 72 hours. Anemia Panel: No results for input(s): VITAMINB12, FOLATE, FERRITIN, TIBC, IRON, RETICCTPCT in the last 72 hours. Sepsis Labs: Recent Labs  Lab 01/07/20 5009  LATICACIDVEN 1.9    Recent Results (from the past 240 hour(s))  Blood culture (routine x 2)     Status: None (Preliminary result)   Collection Time: 01/07/20  8:07 AM   Specimen: BLOOD RIGHT ARM  Result Value Ref Range Status   Specimen Description BLOOD RIGHT ARM  Final   Special Requests   Final    BOTTLES DRAWN AEROBIC AND ANAEROBIC Blood Culture adequate volume   Culture   Final    NO GROWTH 4 DAYS Performed at [facility], [address] [place], [place] [address]    Report Status PENDING  Incomplete  SARS CORONAVIRUS 2 (TAT 6-24 HRS) Nasopharyngeal Nasopharyngeal Swab     Status: None   Collection Time: 01/07/20  9:34 AM   Specimen: Nasopharyngeal Swab  Result Value Ref Range Status   SARS Coronavirus 2 NEGATIVE NEGATIVE Final    Comment: (NOTE) SARS-CoV-2 target nucleic acids are NOT DETECTED. The SARS-CoV-2 RNA is generally detectable in upper and lower respiratory specimens during the acute phase of infection. Negative results do not preclude SARS-CoV-2 infection, do not rule out co-infections with other pathogens, and should not be used as the sole basis for treatment or other patient management decisions. Negative results must be combined with clinical observations, patient history, and  epidemiological information. The expected result is Negative. Fact Sheet for Patients: SugarRoll.be Fact Sheet for Healthcare Providers: https://www.woods-mathews.com/ This test is not yet approved or cleared by the [place] FDA and  has been authorized for detection and/or diagnosis of SARS-CoV-2 by FDA under an Emergency Use Authorization (EUA). This EUA will remain  in effect (meaning this test can be used) for the duration of the COVID-19 declaration under Section 56 4(b)(1)  of the Act, 21 U.S.C. section 360bbb-3(b)(1), unless the authorization is terminated or revoked sooner. Performed at [facility], [place] [address] [place], [place] [address]   Urine culture     Status: None   Collection Time: 01/07/20 10:47 AM   Specimen: In/Out Cath Urine  Result Value Ref Range Status   Specimen Description IN/OUT CATH URINE  Final   Special Requests NONE  Final   Culture   Final    NO GROWTH Performed at [facility], [place] [address], [place], [place] [address]    Report Status 01/08/2020 FINAL  Final     RN Pressure Injury Documentation:     Estimated body mass index is 28.25 kg/m as calculated from the following:   Height as of this encounter: 6' 2"  (1.88 m).   Weight as of this encounter: 99.8 kg.  Malnutrition Type:  Nutrition Problem: Predicted suboptimal nutrient intake Etiology: decreased appetite   Malnutrition Characteristics:  Signs/Symptoms: other (comment)(consult s/p stroke)   Nutrition Interventions:  Interventions: Ensure Enlive (each supplement provides 350kcal and 20 grams of protein), MVI   Radiology Studies: CT HEAD WO CONTRAST  Result Date: 01/10/2020 CLINICAL DATA:  Stroke, follow-up EXAM: CT HEAD WITHOUT CONTRAST TECHNIQUE: Contiguous axial images were obtained from the base of the skull through the vertex without intravenous contrast. COMPARISON:  01/09/2020 FINDINGS: Brain: Evolving  infarction is again identified primarily involving the right frontoparietal lobes, insula, and basal ganglia. Areas of hyperdensity reflecting hemorrhagic conversion are not substantially changed. There is persistent associated mass effect including partial effacement the right lateral ventricle and leftward midline shift again measuring 3-4 mm. There is no evidence of ventricle trapping. No new hemorrhage or loss of gray-white differentiation. Vascular: No new findings. Skull: Calvarium is unremarkable. Sinuses/Orbits: No acute finding. Other: None. IMPRESSION: Evolving cerebral infarction with hemorrhagic conversion and mild leftward midline shift. No new hemorrhage, hydrocephalus, or worsening mass effect. Electronically Signed   By: Macy Mis M.D.   On: 01/10/2020 16:29   ECHO TEE  Result Date: 01/10/2020    TRANSESOPHOGEAL ECHO REPORT   Patient Name:   Derek [name] Date of Exam: 01/10/2020 Medical Rec #:  [id number]        Height:       74.0 in Accession #:    [id number]       Weight:       220.0 lb Date of Birth:  June 18, 1976         BSA:          2.263 m Patient Age:    45 years         BP:           125/82 mmHg Patient Gender: M                HR:           82 bpm. Exam Location:  Inpatient Procedure: 2D Echo Indications:    stroke 434.91  History:        Patient has prior history of Echocardiogram examinations, most                 recent 01/08/2020. Stroke; Risk Factors:Current Smoker. ETOH &                 Tobacco use.  Sonographer:    Jannett Celestine RDCS (AE) Referring Phys: [id number] [place]: After discussion of the risks and benefits of a TEE, an informed consent  was obtained from the patient. TEE procedure time was 15 minutes. The transesophogeal probe was passed without difficulty through the esophogus of the patient. Imaged were obtained with the patient in a left lateral decubitus position. Local oropharyngeal anesthetic was provided with viscous lidocaine. Sedation  performed by different physician. The patient was monitored while under deep sedation. Anesthestetic sedation was provided intravenously by Anesthesiology: 279m of Propofol. Image quality was excellent. The patient's vital signs; including heart rate, blood pressure, and oxygen saturation; remained stable throughout the procedure. The patient developed  no complications during the procedure. IMPRESSIONS  1. Left ventricular ejection fraction, by estimation, is 60 to 65%. The left ventricle has normal function. The left ventricle has no regional wall motion abnormalities.  2. Right ventricular systolic function is normal. The right ventricular size is normal.  3. No left atrial/left atrial appendage thrombus was detected. The LAA emptying velocity was 87 cm/s.  4. Redundant chordal tissue present. The mitral valve is normal in structure. Trivial mitral valve regurgitation. No evidence of mitral stenosis.  5. The aortic valve is tricuspid. Aortic valve regurgitation is not visualized. No aortic stenosis is present.  6. Agitated saline contrast bubble study was negative, with no evidence of any interatrial shunt. Conclusion(s)/Recommendation(s): Normal biventricular function without evidence of hemodynamically significant valvular heart disease. No LA/LAA thrombus identified. Negative bubble study for interatrial shunt. No intracardiac source of embolism detected  on this on this transesophageal echocardiogram. FINDINGS  Left Ventricle: Left ventricular ejection fraction, by estimation, is 60 to 65%. The left ventricle has normal function. The left ventricle has no regional wall motion abnormalities. The left ventricular internal cavity size was normal in size. There is  no left ventricular hypertrophy. Right Ventricle: The right ventricular size is normal. No increase in right ventricular wall thickness. Right ventricular systolic function is normal. Left Atrium: Left atrial size was normal in size. No left  atrial/left atrial appendage thrombus was detected. The LAA emptying velocity was 87 cm/s. Right Atrium: Right atrial size was normal in size. Pericardium: Trivial pericardial effusion is present. The pericardial effusion is circumferential. Mitral Valve: Redundant chordal tissue present. The mitral valve is normal in structure. Trivial mitral valve regurgitation. No evidence of mitral valve stenosis. Tricuspid Valve: The tricuspid valve is normal in structure. Tricuspid valve regurgitation is trivial. No evidence of tricuspid stenosis. Aortic Valve: The aortic valve is tricuspid. Aortic valve regurgitation is not visualized. No aortic stenosis is present. Pulmonic Valve: The pulmonic valve was normal in structure. Pulmonic valve regurgitation is trivial. No evidence of pulmonic stenosis. Aorta: The aortic root, ascending aorta and aortic arch are all structurally normal, with no evidence of dilitation or obstruction. Venous: The right upper pulmonary vein and left upper pulmonary vein are normal. IAS/Shunts: No atrial level shunt detected by color flow Doppler. Agitated saline contrast was given intravenously to evaluate for intracardiac shunting. Agitated saline contrast bubble study was negative, with no evidence of any interatrial shunt. EKG: Rhythm strip during this exam demostrated normal sinus rhythm.   AORTA Ao Root diam: 3.60 cm Ao Asc diam:  3.00 cm TRICUSPID VALVE TR Peak grad:   9.0 mmHg TR Vmax:        150.00 cm/s WEleonore ChiquitoMD Electronically signed by WEleonore ChiquitoMD Signature Date/Time: 01/10/2020/10:16:22 AM    Final    Scheduled Meds: . amLODipine  10 mg Oral Daily  . aspirin  300 mg Rectal Daily   Or  . aspirin  325 mg Oral Daily  .  atorvastatin  40 mg Oral q1800  . enoxaparin (LOVENOX) injection  40 mg Subcutaneous Q24H  . feeding supplement (ENSURE ENLIVE)  237 mL Oral BID BM  . folic acid  1 mg Oral Daily  . labetalol  20 mg Intravenous Once  . metoprolol tartrate  5 mg  Intravenous Q12H  . multivitamin with minerals  1 tablet Oral Daily  . nicotine  7 mg Transdermal Daily  . thiamine  100 mg Oral Daily   Or  . thiamine  100 mg Intravenous Daily   Continuous Infusions: . sodium chloride 50 mL/hr at 01/09/20 1343    LOS: 4 days   Kerney Elbe, DO [facility] PAGER is on AMION  If 7PM-7AM, please contact night-coverage [email or web address]

## 2020-01-11 NOTE — TOC Initial Note (Addendum)
Transition of Care [facility]) - Initial/Assessment Note    Patient Details  Name: Derek [name] MRN: [id number] Date of Birth: 1976/09/09  Transition of Care [facility]) CM/SW Contact:    Pollie Friar, RN Phone Number: 01/11/2020, 2:35 PM  Clinical Narrative:                 CM has met with the patient and his sister. Patient is stating he wants his sister Einar Grad) to be the one to assist in making health care decisions.  Girlfriend: [name] has restarted insurance on the patient and we now have to wait until Monday (when will be submitted) to see if insurance will approve a CIR stay.  Family is going to provide needed support and care after a rehab stay per sister.  TOC following.   1500: provided substance abuse resources to the patient.   Expected Discharge Plan: IP Rehab Facility Barriers to Discharge: Inadequate or no insurance   Patient Goals and CMS Choice   CMS Medicare.gov Compare Post Acute Care list provided to:: Patient Choice offered to / list presented to : Patient, Sibling  Expected Discharge Plan and Services Expected Discharge Plan: [place]   Discharge Planning Services: CM Consult   Living arrangements for the past 2 months: Apartment                                      Prior Living Arrangements/Services Living arrangements for the past 2 months: Apartment Lives with:: Significant Other Patient language and need for interpreter reviewed:: Yes Do you feel safe going back to the place where you live?: Yes      Need for Family Participation in Patient Care: Yes (Comment) Care giver support system in place?: Yes (comment)(family)   Criminal Activity/Legal Involvement Pertinent to Current Situation/Hospitalization: No - Comment as needed  Activities of Daily Living Home Assistive Devices/Equipment: None ADL Screening (condition at time of admission) Patient's cognitive ability adequate to safely complete daily activities?: No Is the patient  deaf or have difficulty hearing?: No Does the patient have difficulty seeing, even when wearing glasses/contacts?: No Does the patient have difficulty concentrating, remembering, or making decisions?: Yes Patient able to express need for assistance with ADLs?: Yes Does the patient have difficulty dressing or bathing?: Yes Independently performs ADLs?: No Communication: Dependent Is this a change from baseline?: Change from baseline, expected to last >3 days Dressing (OT): Dependent Is this a change from baseline?: Change from baseline, expected to last >3 days Grooming: Dependent Is this a change from baseline?: Change from baseline, expected to last >3 days Feeding: Dependent Is this a change from baseline?: Change from baseline, expected to last >3 days Bathing: Dependent Is this a change from baseline?: Change from baseline, expected to last >3 days Toileting: Dependent Is this a change from baseline?: Change from baseline, expected to last >3days In/Out Bed: Dependent Is this a change from baseline?: Change from baseline, expected to last >3 days Walks in Home: Dependent Is this a change from baseline?: Change from baseline, expected to last >3 days Does the patient have difficulty walking or climbing stairs?: Yes Weakness of Legs: Left Weakness of Arms/Hands: Left  Permission Sought/Granted                  Emotional Assessment Appearance:: Appears stated age Attitude/Demeanor/Rapport: Engaged Affect (typically observed): Accepting Orientation: : Oriented to Self, Oriented to Place,  Oriented to  Time, Oriented to Situation Alcohol / Substance Use: Illicit Drugs, Alcohol Use Psych Involvement: No (comment)  Admission diagnosis:  CVA (cerebral vascular accident) [facility]) [I63.9] Cerebrovascular accident (CVA) due to thrombosis of right middle cerebral artery ([place]) [I63.311] Patient Active Problem List   Diagnosis Date Noted  . Cerebrovascular accident (CVA) due to  thrombosis of right middle cerebral artery ([place])   . Chronic pain syndrome   . Hypokalemia   . Dysphagia, post-stroke   . Acute ischemic right MCA stroke ([place]) 01/07/2020  . Tobacco dependence   . Marijuana dependence ([place])   . Alcohol dependence ([place])    PCP:  [name], MD Pharmacy:   [place] ([place]), [place] - [address] [place] ([place]) [place] [address] Phone: [phone] Fax: [phone]     Social Determinants of Health (SDOH) Interventions    Readmission Risk Interventions No flowsheet data found.

## 2020-01-11 NOTE — Progress Notes (Signed)
Occupational Therapy Treatment Patient Details Name: Derek [name] MRN: [id number] DOB: 02-Nov-1975 Today's Date: 01/11/2020    History of present illness 44 yo male no known medical history presenting as Code Stroke.  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 There is hemorrhagic transformation of the right basal ganglia infarct with mass-effect on the lateral ventricle."    OT comments  Pt demonstrates ability to progress following 1 step commands and tolerating L LE in [place]. Pt with decrease sensation to L side of his body and able to verbalized that this session. Pt demonstrates total +2 Mod transfer for stand to sit on a 3n1 simulated task. Pt demonstrates ability to tolerate 3 hours of therapy based on this session. Continue to recommend CIR for d/ c planning.     Follow Up Recommendations  CIR    Equipment Recommendations       Recommendations for Other Services Rehab consult    Precautions / Restrictions Precautions Precautions: Fall Precaution Comments: L neglect, hemiparesis Required Braces or Orthoses: Other Brace Other Brace: L knee immobilizer Restrictions Weight Bearing Restrictions: No       Mobility Bed Mobility Overal bed mobility: Needs Assistance Bed Mobility: Sit to Supine       Sit to supine: Mod assist   General bed mobility comments: Cues for hooking RLE under LLE, assist to elevate up and guide trunk down onto bed  Transfers Overall transfer level: Needs assistance Equipment used: None Transfers: Sit to/from Stand Sit to Stand: Min assist;Mod assist;+2 physical assistance;+2 safety/equipment Stand pivot transfers: Max assist;+2 physical assistance;+2 safety/equipment       General transfer comment: MinA + 2 to pull into standing with R hand on railing and mod +2 to weight shift L to R with tactile facilitation of "shoulder to wall."    Balance Overall balance assessment: Needs  assistance Sitting-balance support: Single extremity supported;Feet supported Sitting balance-Leahy Scale: Fair Sitting balance - Comments: Close supervision   Standing balance support: Single extremity supported;During functional activity Standing balance-Leahy Scale: Poor                             ADL either performed or assessed with clinical judgement   ADL Overall ADL's : Needs assistance/impaired                     Lower Body Dressing: Total assistance   Toilet Transfer: +2 for physical assistance;Maximal assistance;+2 for safety/equipment             General ADL Comments: pt requires cues throughout session to attend to L UE and hold at wrist. pt follows command with delay.      Vision       Perception     Praxis      Cognition Arousal/Alertness: Awake/alert Behavior During Therapy: Impulsive;Flat affect Overall Cognitive Status: Impaired/Different from baseline Area of Impairment: Awareness;Safety/judgement                         Safety/Judgement: Decreased awareness of safety;Decreased awareness of deficits Awareness: Intellectual   General Comments: Follows 1 step commands. Able to recall he fell yesterday and that his left side feels "numb." Decreased interaction/engagement with therapist and MD. Cues for attending to left side.        Exercises     Shoulder Instructions       General Comments pt in  chair on arrival with sister present. pt does rotate head in attempt to locate her as she was leaving. pt noted to have R eye deviation with head rotation    Pertinent Vitals/ Pain       Pain Assessment: Faces Faces Pain Scale: No hurt  Home Living                                          Prior Functioning/Environment              Frequency  Min 3X/week        Progress Toward Goals  OT Goals(current goals can now be found in the care plan section)  Progress towards OT goals:  Progressing toward goals  Acute Rehab OT Goals Patient Stated Goal: get to rehab OT Goal Formulation: With patient Time For Goal Achievement: 01/24/20 Potential to Achieve Goals: Good ADL Goals Pt Will Perform Grooming: with supervision;sitting Pt Will Perform Upper Body Bathing: with supervision;sitting Pt Will Transfer to Toilet: with +2 assist;with mod assist;bedside commode Additional ADL Goal #1: Pt will complete bed mobility mod (A) as precursor to adls Additional ADL Goal #2: pt will visually attend to 50% of session  Plan Discharge plan remains appropriate    Co-evaluation    PT/OT/SLP Co-Evaluation/Treatment: Yes Reason for Co-Treatment: For patient/therapist safety;To address functional/ADL transfers;Necessary to address cognition/behavior during functional activity PT goals addressed during session: Mobility/safety with mobility OT goals addressed during session: ADL's and self-care;Strengthening/ROM      AM-PAC OT "6 Clicks" Daily Activity     Outcome Measure   Help from another person eating meals?: A Little Help from another person taking care of personal grooming?: A Little Help from another person toileting, which includes using toliet, bedpan, or urinal?: A Lot Help from another person bathing (including washing, rinsing, drying)?: A Lot Help from another person to put on and taking off regular upper body clothing?: A Lot Help from another person to put on and taking off regular lower body clothing?: Total 6 Click Score: 13    End of Session Equipment Utilized During Treatment: Gait belt;Left knee immobilizer  OT Visit Diagnosis: Unsteadiness on feet (R26.81);Hemiplegia and hemiparesis Hemiplegia - Right/Left: Left Hemiplegia - dominant/non-dominant: Non-Dominant   Activity Tolerance Patient tolerated treatment well   Patient Left in bed;with bed alarm set;with call bell/phone within reach;Other (comment)(unit staff request return to supine due to no  family present)   Nurse Communication Mobility status;Precautions        Time: 7564-3329 OT Time Calculation (min): 41 min  Charges: OT General Charges $OT Visit: 1 Visit OT Treatments $Self Care/Home Management : 8-22 mins   Brynn, OTR/L  Acute Rehabilitation Services Pager: [phone] Office: [phone] .    Mateo Flow 01/11/2020, 4:41 PM

## 2020-01-11 NOTE — Progress Notes (Signed)
Physical Therapy Treatment Patient Details Name: ADIR SCHICKER MRN: [id number] DOB: 24-Dec-1975 Today's Date: 01/11/2020    History of Present Illness 44 yo male no known medical history presenting as Code Stroke.  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 There is hemorrhagic transformation of the right basal ganglia infarct with mass-effect on the lateral ventricle."     PT Comments    Pt making steady progress towards his physical therapy goals. Session focused on initiation of gait training. Pt requiring two person maximal assist for ambulating 5 feet x 2 with right wall railing (close chair follow utilized). Displays strong left knee buckle in midstance due to continued weakness and decreased sensation. Remains appropriate for CIR given age, motivation, and PLOF.     Follow Up Recommendations  CIR;Supervision/Assistance - 24 hour     Equipment Recommendations  Wheelchair (measurements PT);Wheelchair cushion (measurements PT)    Recommendations for Other Services       Precautions / Restrictions Precautions Precautions: Fall Precaution Comments: L neglect, hemiparesis Required Braces or Orthoses: Other Brace Other Brace: L knee immobilizer Restrictions Weight Bearing Restrictions: No    Mobility  Bed Mobility Overal bed mobility: Needs Assistance Bed Mobility: Sit to Supine       Sit to supine: Mod assist   General bed mobility comments: Cues for hooking RLE under LLE, assist to elevate up and guide trunk down onto bed  Transfers Overall transfer level: Needs assistance Equipment used: None Transfers: Sit to/from Stand Sit to Stand: Min assist;Mod assist;+2 physical assistance;+2 safety/equipment Stand pivot transfers: Max assist;+2 physical assistance;+2 safety/equipment       General transfer comment: MinA + 2 to pull into standing with R hand on railing and mod +2 to weight shift L to R with tactile  facilitation of "shoulder to wall."  Ambulation/Gait Ambulation/Gait assistance: Max assist;+2 physical assistance;+2 safety/equipment Gait Distance (Feet): 10 Feet(5", 5") Assistive device: (R railing) Gait Pattern/deviations: Step-through pattern;Decreased weight shift to left;Decreased dorsiflexion - left Gait velocity: decreased Gait velocity interpretation: <1.31 ft/sec, indicative of household ambulator General Gait Details: Pt requiring maxA + 2 for gait, with close chair follow utilized. Tactile cues provided for weight shifting, stepping initiation, hip extension. Able to minimally progress LLE forwards but needing manual assist to complete and for placement on ground in addition to knee block due to strong buckle.   Stairs             Wheelchair Mobility    Modified Rankin (Stroke Patients Only) Modified Rankin (Stroke Patients Only) Pre-Morbid Rankin Score: No symptoms Modified Rankin: Severe disability     Balance Overall balance assessment: Needs assistance Sitting-balance support: Single extremity supported;Feet supported Sitting balance-Leahy Scale: Fair Sitting balance - Comments: Close supervision   Standing balance support: Single extremity supported;During functional activity Standing balance-Leahy Scale: Poor                              Cognition Arousal/Alertness: Awake/alert Behavior During Therapy: Impulsive;Flat affect Overall Cognitive Status: Impaired/Different from baseline Area of Impairment: Awareness;Safety/judgement                         Safety/Judgement: Decreased awareness of safety;Decreased awareness of deficits Awareness: Intellectual   General Comments: Follows 1 step commands. Able to recall he fell yesterday and that his left side feels "numb." Decreased interaction/engagement with therapist and MD. Cues for attending to  left side.      Exercises      General Comments        Pertinent  Vitals/Pain Pain Assessment: Faces Faces Pain Scale: No hurt    Home Living                      Prior Function            PT Goals (current goals can now be found in the care plan section) Acute Rehab PT Goals Patient Stated Goal: get to rehab Potential to Achieve Goals: Good Progress towards PT goals: Progressing toward goals    Frequency    Min 4X/week      PT Plan Current plan remains appropriate    Co-evaluation PT/OT/SLP Co-Evaluation/Treatment: Yes Reason for Co-Treatment: For patient/therapist safety;To address functional/ADL transfers PT goals addressed during session: Mobility/safety with mobility        AM-PAC PT "6 Clicks" Mobility   Outcome Measure  Help needed turning from your back to your side while in a flat bed without using bedrails?: A Little Help needed moving from lying on your back to sitting on the side of a flat bed without using bedrails?: A Little Help needed moving to and from a bed to a chair (including a wheelchair)?: Total Help needed standing up from a chair using your arms (e.g., wheelchair or bedside chair)?: A Little Help needed to walk in hospital room?: Total Help needed climbing 3-5 steps with a railing? : Total 6 Click Score: 12    End of Session Equipment Utilized During Treatment: Gait belt Activity Tolerance: Patient tolerated treatment well Patient left: with call bell/phone within reach;in bed;with nursing/sitter in room Nurse Communication: Mobility status PT Visit Diagnosis: Other abnormalities of gait and mobility (R26.89);Other symptoms and signs involving the nervous system (R29.898);Hemiplegia and hemiparesis Hemiplegia - Right/Left: Left Hemiplegia - dominant/non-dominant: Non-dominant Hemiplegia - caused by: Cerebral infarction;Nontraumatic intracerebral hemorrhage     Time: 2694-8546 PT Time Calculation (min) (ACUTE ONLY): 24 min  Charges:  $Gait Training: 8-22 mins                        Lillia Pauls, PT, DPT Acute Rehabilitation Services Pager [phone] Office [phone]    Norval Morton 01/11/2020, 3:04 PM

## 2020-01-12 DIAGNOSIS — D72829 Elevated white blood cell count, unspecified: Secondary | ICD-10-CM

## 2020-01-12 LAB — MAGNESIUM: Magnesium: 2.2 mg/dL (ref 1.7–2.4)

## 2020-01-12 LAB — CBC WITH DIFFERENTIAL/PLATELET
Abs Immature Granulocytes: 0.07 10*3/uL (ref 0.00–0.07)
Basophils Absolute: 0.1 10*3/uL (ref 0.0–0.1)
Basophils Relative: 1 %
Eosinophils Absolute: 0.2 10*3/uL (ref 0.0–0.5)
Eosinophils Relative: 2 %
HCT: 44.1 % (ref 39.0–52.0)
Hemoglobin: 14.7 g/dL (ref 13.0–17.0)
Immature Granulocytes: 1 %
Lymphocytes Relative: 17 %
Lymphs Abs: 1.8 10*3/uL (ref 0.7–4.0)
MCH: 29.8 pg (ref 26.0–34.0)
MCHC: 33.3 g/dL (ref 30.0–36.0)
MCV: 89.3 fL (ref 80.0–100.0)
Monocytes Absolute: 1.2 10*3/uL — ABNORMAL HIGH (ref 0.1–1.0)
Monocytes Relative: 11 %
Neutro Abs: 7.3 10*3/uL (ref 1.7–7.7)
Neutrophils Relative %: 68 %
Platelets: 265 10*3/uL (ref 150–400)
RBC: 4.94 MIL/uL (ref 4.22–5.81)
RDW: 17.2 % — ABNORMAL HIGH (ref 11.5–15.5)
WBC: 10.7 10*3/uL — ABNORMAL HIGH (ref 4.0–10.5)
nRBC: 0 % (ref 0.0–0.2)

## 2020-01-12 LAB — COMPREHENSIVE METABOLIC PANEL
ALT: 39 U/L (ref 0–44)
AST: 40 U/L (ref 15–41)
Albumin: 3.5 g/dL (ref 3.5–5.0)
Alkaline Phosphatase: 71 U/L (ref 38–126)
Anion gap: 11 (ref 5–15)
BUN: 13 mg/dL (ref 6–20)
CO2: 21 mmol/L — ABNORMAL LOW (ref 22–32)
Calcium: 9 mg/dL (ref 8.9–10.3)
Chloride: 106 mmol/L (ref 98–111)
Creatinine, Ser: 0.9 mg/dL (ref 0.61–1.24)
GFR calc Af Amer: >60 mL/min (ref >60)
GFR calc non Af Amer: >60 mL/min (ref >60)
Glucose, Bld: 94 mg/dL (ref 70–99)
Potassium: 4.1 mmol/L (ref 3.5–5.1)
Sodium: 138 mmol/L (ref 135–145)
Total Bilirubin: 0.6 mg/dL (ref 0.3–1.2)
Total Protein: 7 g/dL (ref 6.5–8.1)

## 2020-01-12 LAB — PHOSPHORUS: Phosphorus: 4.7 mg/dL — ABNORMAL HIGH (ref 2.5–4.6)

## 2020-01-12 LAB — CULTURE, BLOOD (ROUTINE X 2)
Culture: NO GROWTH
Special Requests: ADEQUATE

## 2020-01-12 NOTE — Progress Notes (Signed)
PROGRESS NOTE    Derek [name]  HFW:[id number] DOB: Jul 27, 1976 DOA: 01/07/2020 PCP: [name], MD   Brief Narrative:  HPI per Dr. [name] on 01/07/20 Derek [name] is a 44 y.o. male with no known medical history presenting as Code Stroke.  The patient is mostly sleeping and awakes and appears appropriate but his conversation doesn't exactly make sense.  He complains of being cold - when I asked him to straighten his right leg out (it was behind his left leg), he says he was "just putting it behind this thing to keep it warm" and seems unaware completely of his left side.  He reports that he needs to call his work so he doesn't get fired, but then didn't actually want to use the phone. He reports headache.  I called and spoke with his sister.  She was notified this AM that he was taken here.  She reports that he went out Saturday night to a family member's birthday party; they took him home and he was very intoxicated.  He was placed on the bed after 0200.  His girlfriend noted him on the floor sometime later.  She found him there again sometime later and had someone help him into the bed.  He was not moving on one side.  He apparently fell back on the floor at some point.  This AM, she finally called 911.  He has chronic back pain from a remote accident.  He has been under a lot of stress - including with his live-in girlfriend.  He also has a 35yo son.   ED Course:  EMS called for a fall, noted left-sided weakness, facial droop, slurred speech, rightward gaze.  CT consistent with R MCA CVA.  Neuro has seen, MRI recommended.  Temp 100.5, given empiric abx, no symptoms or apparent source.  **Interim History Currently being worked up for stroke and currently there is no definitive cause so patient undergoing a TEE which was done this morning and showed no LA/LAA thrombus and no PFO by color bubble study in the normal left ventricular EF of 60 to 65% with no significant valvular heart  disease.  He did have a hemorrhagic conversion and neurology does recommend no dual antiplatelet therapy because of the hemorrhagic conversion.  Currently on aspirin monotherapy for now.  PT/OT recommending CIR at discharge and currently after discussion with the case manager and the social worker patient cannot be discharged until at least Monday until insurance is obtained and see if they will approve a CIR stay. Currently does not have safe disposition otherwise she will need to remain in the hospital for now.  Assessment & Plan:   Principal Problem:   Acute ischemic right MCA stroke (HCC) Active Problems:   Tobacco dependence   Marijuana dependence ([place])   Alcohol dependence ([place])   Cerebrovascular accident (CVA) due to thrombosis of right middle cerebral artery (HCC)   Chronic pain syndrome   Hypokalemia   Dysphagia, post-stroke   Leukocytosis  Acute CVA with Cerebral Edema with Hemorrhagic conversion of the CVA -Patient was last seen normal at approximately 2 AM on Sunday morning, and has had recurrent falls; patient fell again yesterday -With significant left-sided deficits and cognitive impairment, as well as lethargy on examination which is improved since yesterday -CT head: Recent M1 segment, right middle cerebral artery infarct with mass-effect and possible petechial hemorrhage in portions of the right basal ganglia.  Proximal extent of postero superior right frontal and adjacent right  parietal lobes. -CTA neck: neck no large vessel occlusion or significant stenosis -MRI head showed acute right MCA territory going basal ganglia and right parietal lobe.  Hemorrhagic transformation of the right basal ganglia infarct with mass-effect on the lateral ventricle -Echocardiogram showed EF of 08-14%, LV diastolic parameters normal. -Lower extremity Doppler showed no evidence of DVT -Carotid doppler: B/L 1-39% ICA stenosis.  Bilateral vertebral arteries demonstrate antegrade flow -LDL 138,  hemoglobin A1c 5.6 -Full lipid panel showed a total cholesterol/HDL ratio of 4.3, cholesterol level 204, HDL 47, LDL 138, triglycerides of 94, VLDL 19 -Continue with Atorvastatin 40 mg p.o. daily -Neurology had considered 3% saline but not needed at this time and repeat CT 3/11 showed "Evolving cerebral infarction with hemorrhagic conversion and mild leftward midline shift. No new hemorrhage, hydrocephalus, or worsening mass effect." -Repeat head CT scan 3/10 showed "Evolving right cerebral infarct with evidence for hemorrhagic transformation at the right basal ganglia, similar in appearance and distribution as compared to previous MRI. Associated mass effect with 3 mm of right-to-left shift. No hydrocephalus or ventricular trapping. 2. No other new acute intracranial abnormality" -Neurology consulted and appreciated' -Dr. Erlinda Hong recommends TEE which was done today and showed no LAA/LAA thrombus and no PFO by color bubble study.  The left ventricular EF was normal with the ejection fraction of 60 to 65% and there is no significant valvular heart disease noted -Hypercoagulable work-up ordered and his PTT lupus anticoagulant, DRVVT was negative and his ANA antibody was negative -Continue Aspirin 325 mg daily for now.  Holding DAPT given his hemorrhagic transformation. -Neurology recommending now blood pressure less than 160 given his hemorrhagic conversion the long-term blood pressure goal normotensive -PT and OT recommending CIR and CIR was consulted for further evaluation recommendations -SLP evaluated and recommended dysphagia 2 diet with nectar thick liquids and are going to defer recommending instrumental swallow assessment until after the TEE; SLP evaluated and the patient now on a dysphagia 3 mechanical soft diet with nectar thick liquids -Continue monitor and follow-up on SLP recommendations -CIR Physician Dr. Posey Pronto was consulted and feels that the patient would be a good candidate for CIR -Patient  is to be discharged to CIR however unfortunately cannot go until at least Monday per my discussion with the transition of care team given the patient's insurance and that he has no other safe discharge disposition at this time he will need to remain in the hospital until that timeframe. -Neurology has signed off the case now and recommends a 30-day CardioNet monitor as an outpatient to rule out if atrial fibrillation -Dr. Erlinda Hong recommends follow-up with the stroke clinic at [facility] 4 weeks after discharge from rehab  Essential Hypertension -Placed on Amlodipine 10 mg po Daily  -Currently  Was on metoprolol tartrate 5 mg IV every 12 but will stop -BP goal less than 160 given hemorrhagic conversion -He was ordered labetalol 20 mg IV once but was not given due to parameters not being met -BP is now 112/68  Alcohol Dependence -Continue CIWA protocol with IV lorazepam -No signs of withdrawal -Continue Folic acid, Multivitamin + Minerals, and Thiamine supplementation  Marijuana Dependence -UDS positive for THC -Continue with Cessation efforts   Tobacco Dependence -Continue Nicotine 7 mg TD Patch -Smoking Cessation Counseling given    SIRS with a Recurrent Fever and now leukocytosis -Patient with leukocytosis of 14.3 on admission and temperature of 100.5 F but had imporved. WBC was trending up and went from 9.0 -> 9.8 -> 10.7;  patien T-max of 100.6 yesterday  -UA unremarkable for infection, urine culture shows no growth -Blood cultures pending showed no growth to date at 5 days -Suspect secondary to the above but if continues to spike temperatures again we will panculture again as his WBC is worsening  -Continue to monitor and trend temperature curve -Currently patient is denying any shortness of breath, burning or discomfort in his urine or any other complaints and has no diarrhea -Repeat CBC in AM   Hypokalemia -Improved as patient's potassium is now 4.1  -Continue to Monitor and Replete as Necessary -Repeat CMP in AM   HLD -Full lipid panel showed a total cholesterol/HDL ratio of 4.3, cholesterol level 204, HDL 47, LDL 138, triglycerides of 94, VLDL 19 -His ALT was mildly elevated at 43 yesterday and today it was 46 and will need to continue to monitor -Continue with Atorvastatin 40 mg p.o. daily  Elevated ALT -Mild at 52 yesterday and today was 46 -Continue monitor and trend hepatic function panel -If worsening or not improving will need to obtain a right upper quadrant ultrasound and acute hepatitis panel -Repeat CMP in the a.m.  Headache -Secondary to above -Not amenable acetaminophen so we will try oxycodone as we will try and avoid NSAIDs due to hemorrhagic conversion  Unwitnessed Fall -Patient fell getting out of chair and hit his Head per Nursing the day before yesterday -Ordered Stat Head CT w/o Contrast as he has had a CVA with Hemorrhagic Conversion; Repeat CT showed "Evolving cerebral infarction with hemorrhagic conversion and mild leftward midline shift. No new hemorrhage, hydrocephalus, or worsening mass effect." -Notified Neurology and had no additional Recommendations -C/w Fall Precautions   Fever, improved now -Mild Temperature of 100.6 the night before last and had a temperature of 100.5 on admission -Patient is Afebrile now -Continue to Monitor for S/Sx of Infection -If Spikes a Fever again will  -Blood Cx from 01/07/20 showed NGTD at 5 Days -Repeat blood cultures if spikes another temperature; T-max over last 24 hours was 99.3 -We will obtain a chest x-ray in the a.m. -Currently has a mild leukocytosis and WBC is 10.7 -Continue monitor and trend temperature curve and WBC  DVT prophylaxis: SCDs given his hemorrhagic Transformation but now he is back on enoxaparin 40 mg subcu every 24 after Neurology is restarted this. Code Status: FULL CODE  Family Communication: No family present at bedside Disposition Plan:  Patient is from home and had a CVA which then had hemorrhagic conversion and requires CIR at discharge.  Patient was fully worked up from a neurological standpoint and had his TEE done however is now spiking mild temperatures intermittently and has a slight leukocytosis.  Will need to have insurance approval before discharge to CIR and this will not happen until Monday, 01/14/2020   Consultants:   Neurology  Cardiology for TEE    Procedures:  ECHOCARDIOGRAM IMPRESSIONS    1. Left ventricular ejection fraction, by estimation, is 60 to 65%. The  left ventricle has normal function. The left ventricle has no regional  wall motion abnormalities. Left ventricular diastolic parameters were  normal.  2. Right ventricular systolic function is normal. The right ventricular  size is normal.  3. The mitral valve is normal in structure. No evidence of mitral valve  regurgitation. No evidence of mitral stenosis.  4. The aortic valve is normal in structure. Aortic valve regurgitation is  not visualized. No aortic stenosis is present.  5. The inferior vena cava is normal in size  with greater than 50%  respiratory variability, suggesting right atrial pressure of 3 mmHg.   FINDINGS  Left Ventricle: Left ventricular ejection fraction, by estimation, is 60  to 65%. The left ventricle has normal function. The left ventricle has no  regional wall motion abnormalities. The left ventricular internal cavity  size was normal in size. There is  no left ventricular hypertrophy. Left ventricular diastolic parameters  were normal. Normal left ventricular filling pressure.   Right Ventricle: The right ventricular size is normal. No increase in  right ventricular wall thickness. Right ventricular systolic function is  normal.   Left Atrium: Left atrial size was normal in size.   Right Atrium: Right atrial size was normal in size.   Pericardium: There is no evidence of pericardial effusion.    Mitral Valve: The mitral valve is normal in structure. Normal mobility of  the mitral valve leaflets. No evidence of mitral valve regurgitation. No  evidence of mitral valve stenosis.   Tricuspid Valve: The tricuspid valve is normal in structure. Tricuspid  valve regurgitation is not demonstrated. No evidence of tricuspid  stenosis.   Aortic Valve: The aortic valve is normal in structure. Aortic valve  regurgitation is not visualized. No aortic stenosis is present.   Pulmonic Valve: The pulmonic valve was normal in structure. Pulmonic valve  regurgitation is not visualized. No evidence of pulmonic stenosis.   Aorta: The aortic root is normal in size and structure.   Venous: The inferior vena cava is normal in size with greater than 50%  respiratory variability, suggesting right atrial pressure of 3 mmHg.   IAS/Shunts: No atrial level shunt detected by color flow Doppler.     LEFT VENTRICLE  PLAX 2D  LVIDd:     5.40 cm Diastology  LVIDs:     3.20 cm LV e' lateral:  12.30 cm/s  LV PW:     1.00 cm LV E/e' lateral: 4.9  LV IVS:    0.90 cm LV e' medial:  10.20 cm/s  LVOT diam:   2.20 cm LV E/e' medial: 5.9  LV SV:     61  LV SV Index:  27  LVOT Area:   3.80 cm     RIGHT VENTRICLE  RV S prime:   12.50 cm/s  TAPSE (M-mode): 2.2 cm   LEFT ATRIUM       Index    RIGHT ATRIUM      Index  LA diam:    3.40 cm 1.50 cm/m RA Area:   14.80 cm  LA Vol (A2C):  46.5 ml 20.55 ml/m RA Volume:  33.20 ml 14.67 ml/m  LA Vol (A4C):  20.4 ml 9.01 ml/m  LA Biplane Vol: 30.8 ml 13.61 ml/m  AORTIC VALVE  LVOT Vmax:  86.80 cm/s  LVOT Vmean: 51.000 cm/s  LVOT VTI:  0.161 m    AORTA  Ao Root diam: 3.10 cm   MITRAL VALVE  MV Area (PHT): 3.31 cm  SHUNTS  MV Decel Time: 229 msec  Systemic VTI: 0.16 m  MV E velocity: 60.40 cm/s Systemic Diam: 2.20 cm  MV A velocity: 52.70 cm/s  MV E/A ratio: 1.15   LOWER  EXTREMITY DUPLEX RIGHT:  - There is no evidence of deep vein thrombosis in the lower extremity.    - No cystic structure found in the popliteal fossa.    LEFT:  - There is no evidence of deep vein thrombosis in the lower extremity.    - No cystic structure found  in the popliteal fossa.   TEE 01/10/20 1. No LAA/LA thrombus.  2. No PFO by color or bubble study.  3. LVEF normal 60-65%. 4. No significant valvular heart disease.   IMPRESSIONS    1. Left ventricular ejection fraction, by estimation, is 60 to 65%. The  left ventricle has normal function. The left ventricle has no regional  wall motion abnormalities.  2. Right ventricular systolic function is normal. The right ventricular  size is normal.  3. No left atrial/left atrial appendage thrombus was detected. The LAA  emptying velocity was 87 cm/s.  4. Redundant chordal tissue present. The mitral valve is normal in  structure. Trivial mitral valve regurgitation. No evidence of mitral  stenosis.  5. The aortic valve is tricuspid. Aortic valve regurgitation is not  visualized. No aortic stenosis is present.  6. Agitated saline contrast bubble study was negative, with no evidence  of any interatrial shunt.   Conclusion(s)/Recommendation(s): Normal biventricular function without  evidence of hemodynamically significant valvular heart disease. No LA/LAA  thrombus identified. Negative bubble study for interatrial shunt. No  intracardiac source of embolism detected  on this on this transesophageal echocardiogram.   FINDINGS  Left Ventricle: Left ventricular ejection fraction, by estimation, is 60  to 65%. The left ventricle has normal function. The left ventricle has no  regional wall motion abnormalities. The left ventricular internal cavity  size was normal in size. There is  no left ventricular hypertrophy.   Right Ventricle: The right ventricular size is normal. No increase in  right ventricular wall  thickness. Right ventricular systolic function is  normal.   Left Atrium: Left atrial size was normal in size. No left atrial/left  atrial appendage thrombus was detected. The LAA emptying velocity was 87  cm/s.   Right Atrium: Right atrial size was normal in size.   Pericardium: Trivial pericardial effusion is present. The pericardial  effusion is circumferential.   Mitral Valve: Redundant chordal tissue present. The mitral valve is normal  in structure. Trivial mitral valve regurgitation. No evidence of mitral  valve stenosis.   Tricuspid Valve: The tricuspid valve is normal in structure. Tricuspid  valve regurgitation is trivial. No evidence of tricuspid stenosis.   Aortic Valve: The aortic valve is tricuspid. Aortic valve regurgitation is  not visualized. No aortic stenosis is present.   Pulmonic Valve: The pulmonic valve was normal in structure. Pulmonic valve  regurgitation is trivial. No evidence of pulmonic stenosis.   Aorta: The aortic root, ascending aorta and aortic arch are all  structurally normal, with no evidence of dilitation or obstruction.   Venous: The right upper pulmonary vein and left upper pulmonary vein are  normal.   IAS/Shunts: No atrial level shunt detected by color flow Doppler. Agitated  saline contrast was given intravenously to evaluate for intracardiac  shunting. Agitated saline contrast bubble study was negative, with no  evidence of any interatrial shunt.   EKG: Rhythm strip during this exam demostrated normal sinus rhythm.       AORTA  Ao Root diam: 3.60 cm  Ao Asc diam: 3.00 cm   TRICUSPID VALVE  TR Peak grad:  9.0 mmHg  TR Vmax:    150.00 cm/s      Antimicrobials: Anti-infectives (From admission, onward)   Start     Dose/Rate Route Frequency Ordered Stop   01/07/20 2200  vancomycin (VANCOREADY) IVPB 1500 mg/300 mL  Status:  Discontinued     1,500 mg 150 mL/hr over 120 Minutes Intravenous Every  12 hours 01/07/20 0834  01/07/20 1332   01/07/20 1400  ceFEPIme (MAXIPIME) 2 g in sodium chloride 0.9 % 100 mL IVPB  Status:  Discontinued     2 g 200 mL/hr over 30 Minutes Intravenous Every 8 hours 01/07/20 0834 01/07/20 1332   01/07/20 0830  ceFEPIme (MAXIPIME) 2 g in sodium chloride 0.9 % 100 mL IVPB     2 g 200 mL/hr over 30 Minutes Intravenous  Once 01/07/20 0826 01/07/20 0932   01/07/20 0830  metroNIDAZOLE (FLAGYL) IVPB 500 mg     500 mg 100 mL/hr over 60 Minutes Intravenous  Once 01/07/20 0826 01/07/20 1041   01/07/20 0830  vancomycin (VANCOCIN) IVPB 1000 mg/200 mL premix  Status:  Discontinued     1,000 mg 200 mL/hr over 60 Minutes Intravenous  Once 01/07/20 0826 01/07/20 0828   01/07/20 0830  vancomycin (VANCOREADY) IVPB 2000 mg/400 mL     2,000 mg 200 mL/hr over 120 Minutes Intravenous  Once 01/07/20 7035 01/07/20 1135     Subjective: Seen and examined at bedside and he stated that he had "another wild night".  States that he is unable to sleep due to the light in the hallway that was on.  No chest pain, lightheadedness or dizziness.  No nausea or vomiting and no burning or discomfort in his urine and no diarrhea.  Feels okay and states his head is better.  No other concerns or complaints at this time.  Objective: Vitals:   01/11/20 2334 01/12/20 0401 01/12/20 0841 01/12/20 1153  BP: 100/65 112/70 125/75 112/68  Pulse: 70 74 81 84  Resp: _0 Temp: 98.3 F (36.8 C) 98.6 F (37 C) 98.7 F (37.1 C) 98.5 F (36.9 C)  TempSrc: Oral Oral Oral Oral  SpO2: 96% 95% 98% 98%  Weight:      Height:        Intake/Output Summary (Last 24 hours) at 01/12/2020 1416 Last data filed at 01/11/2020 1558 Gross per 24 hour  Intake -  Output 525 ml  Net -525 ml   Filed Weights   01/07/20 0659  Weight: 99.8 kg   Examination: Physical Exam:  Constitutional: WN/WD overweight African-American male currently in no acute distress appears calm but does appear a little uncomfortable again Eyes: Lids  and conjunctivae normal, sclerae anicteric  ENMT: External Ears, Nose appear normal. Grossly normal hearing.  Has a left facial droop Neck: Appears normal, supple, no cervical masses, normal ROM, no appreciable thyromegaly; no JVD Respiratory: Diminished to auscultation bilaterally, no wheezing, rales, rhonchi or crackles. Normal respiratory effort and patient is not tachypenic. No accessory muscle use.  Unlabored breathing Cardiovascular: RRR, no murmurs / rubs / gallops. S1 and S2 auscultated.  No lower extremity edema noted. Abdomen: Soft, non-tender, mildly distended secondary body habitus. Bowel sounds positive x4.  GU: Deferred.  Wearing a condom catheter Musculoskeletal: No clubbing / cyanosis of digits/nails.  Has some left hemiplegia Skin: No rashes, lesions, ulcers on a limited skin evaluation. No induration; Warm and dry.  Neurologic: CN 2-12 grossly intact with no focal deficits he does have a left facial droop.  Also has a left hemiplegia and unable to move his left arm or his left leg Psychiatric: Normal judgment and insight. Alert and oriented x 3.  Slightly depressed appearing at a flat affect and mood,  Data Reviewed: I have personally reviewed following labs and imaging studies  CBC: Recent Labs  Lab 01/07/20 0703 01/09/20 0429 01/10/20 0093  01/11/20 0415 01/12/20 0404  WBC 14.3* 9.2 9.0 9.8 10.7*  NEUTROABS 12.6*  --  5.9 6.9 7.3  HGB 14.8 14.2 13.8 14.0 14.7  HCT 44.7 42.4 41.0 42.8 44.1  MCV 88.9 88.1 87.8 89.2 89.3  PLT 314 256 233 267 967   Basic Metabolic Panel: Recent Labs  Lab 01/07/20 0703 01/07/20 1644 01/09/20 0429 01/10/20 0517 01/11/20 0415 01/12/20 0404  NA 140  --  140 138 138 138  K 4.0  --  3.4* 3.9 4.1 4.1  CL 102  --  107 108 103 106  CO2 23  --  24 22 21* 21*  GLUCOSE 134*  --  98 101* 84 94  BUN 8  --  _0 CREATININE 1.19  --  0.88 0.92 0.89 0.90  CALCIUM 9.6  --  8.9 8.8* 9.2 9.0  MG  --  2.1  --  2.1 2.2 2.2  PHOS  --   3.3  --  3.6 4.1 4.7*   GFR: Estimated Creatinine Clearance: 132.1 mL/min (by C-G formula based on SCr of 0.9 mg/dL). Liver Function Tests: Recent Labs  Lab 01/07/20 0703 01/10/20 0517 01/11/20 0415 01/12/20 0404  AST 43* 43* 46* 40  ALT 31 33 33 39  ALKPHOS 85 64 67 71  BILITOT 0.8 0.5 0.8 0.6  PROT 7.5 6.4* 6.8 7.0  ALBUMIN 4.2 3.2* 3.4* 3.5   No results for input(s): LIPASE, AMYLASE in the last 168 hours. No results for input(s): AMMONIA in the last 168 hours. Coagulation Profile: Recent Labs  Lab 01/07/20 0703  INR 1.0   Cardiac Enzymes: Recent Labs  Lab 01/07/20 0703  CKTOTAL 787*   BNP (last 3 results) No results for input(s): PROBNP in the last 8760 hours. HbA1C: No results for input(s): HGBA1C in the last 72 hours. CBG: No results for input(s): GLUCAP in the last 168 hours. Lipid Profile: No results for input(s): CHOL, HDL, LDLCALC, TRIG, CHOLHDL, LDLDIRECT in the last 72 hours. Thyroid Function Tests: No results for input(s): TSH, T4TOTAL, FREET4, T3FREE, THYROIDAB in the last 72 hours. Anemia Panel: No results for input(s): VITAMINB12, FOLATE, FERRITIN, TIBC, IRON, RETICCTPCT in the last 72 hours. Sepsis Labs: Recent Labs  Lab 01/07/20 8938  LATICACIDVEN 1.9    Recent Results (from the past 240 hour(s))  Blood culture (routine x 2)     Status: None   Collection Time: 01/07/20  8:07 AM   Specimen: BLOOD RIGHT ARM  Result Value Ref Range Status   Specimen Description BLOOD RIGHT ARM  Final   Special Requests   Final    BOTTLES DRAWN AEROBIC AND ANAEROBIC Blood Culture adequate volume   Culture   Final    NO GROWTH 5 DAYS Performed at [facility], [address], [place], [place] [address]    Report Status 01/12/2020 FINAL  Final  SARS CORONAVIRUS 2 (TAT 6-24 HRS) Nasopharyngeal Nasopharyngeal Swab     Status: None   Collection Time: 01/07/20  9:34 AM   Specimen: Nasopharyngeal Swab  Result Value Ref Range Status   SARS Coronavirus 2  NEGATIVE NEGATIVE Final    Comment: (NOTE) SARS-CoV-2 target nucleic acids are NOT DETECTED. The SARS-CoV-2 RNA is generally detectable in upper and lower respiratory specimens during the acute phase of infection. Negative results do not preclude SARS-CoV-2 infection, do not rule out co-infections with other pathogens, and should not be used as the sole basis for treatment or other patient management decisions. Negative  results must be combined with clinical observations, patient history, and epidemiological information. The expected result is Negative. Fact Sheet for Patients: SugarRoll.be Fact Sheet for Healthcare Providers: https://www.woods-mathews.com/ This test is not yet approved or cleared by the [place] FDA and  has been authorized for detection and/or diagnosis of SARS-CoV-2 by FDA under an Emergency Use Authorization (EUA). This EUA will remain  in effect (meaning this test can be used) for the duration of the COVID-19 declaration under Section 56 4(b)(1) of the Act, 21 U.S.C. section 360bbb-3(b)(1), unless the authorization is terminated or revoked sooner. Performed at [facility], [place] [address] [address], [place], [place] [address]   Urine culture     Status: None   Collection Time: 01/07/20 10:47 AM   Specimen: In/Out Cath Urine  Result Value Ref Range Status   Specimen Description IN/OUT CATH URINE  Final   Special Requests NONE  Final   Culture   Final    NO GROWTH Performed at [facility], [place] [address] [place], [place] [address]    Report Status 01/08/2020 FINAL  Final     RN Pressure Injury Documentation:     Estimated body mass index is 28.25 kg/m as calculated from the following:   Height as of this encounter: _0  (1.88 m).   Weight as of this encounter: 99.8 kg.  Malnutrition Type:  Nutrition Problem: Predicted suboptimal nutrient intake Etiology: decreased appetite   Malnutrition  Characteristics:  Signs/Symptoms: other (comment)(consult s/p stroke)   Nutrition Interventions:  Interventions: Ensure Enlive (each supplement provides 350kcal and 20 grams of protein), MVI   Radiology Studies: CT HEAD WO CONTRAST  Result Date: 01/10/2020 CLINICAL DATA:  Stroke, follow-up EXAM: CT HEAD WITHOUT CONTRAST TECHNIQUE: Contiguous axial images were obtained from the base of the skull through the vertex without intravenous contrast. COMPARISON:  01/09/2020 FINDINGS: Brain: Evolving infarction is again identified primarily involving the right frontoparietal lobes, insula, and basal ganglia. Areas of hyperdensity reflecting hemorrhagic conversion are not substantially changed. There is persistent associated mass effect including partial effacement the right lateral ventricle and leftward midline shift again measuring 3-4 mm. There is no evidence of ventricle trapping. No new hemorrhage or loss of gray-white differentiation. Vascular: No new findings. Skull: Calvarium is unremarkable. Sinuses/Orbits: No acute finding. Other: None. IMPRESSION: Evolving cerebral infarction with hemorrhagic conversion and mild leftward midline shift. No new hemorrhage, hydrocephalus, or worsening mass effect. Electronically Signed   By: Macy Mis M.D.   On: 01/10/2020 16:29   Scheduled Meds: . amLODipine  10 mg Oral Daily  . aspirin  300 mg Rectal Daily   Or  . aspirin  325 mg Oral Daily  . atorvastatin  40 mg Oral q1800  . enoxaparin (LOVENOX) injection  40 mg Subcutaneous Q24H  . feeding supplement (ENSURE ENLIVE)  237 mL Oral BID BM  . folic acid  1 mg Oral Daily  . labetalol  20 mg Intravenous Once  . multivitamin with minerals  1 tablet Oral Daily  . nicotine  7 mg Transdermal Daily  . thiamine  100 mg Oral Daily   Or  . thiamine  100 mg Intravenous Daily   Continuous Infusions: . sodium chloride 50 mL/hr at 01/09/20 1343    LOS: 5 days   Kerney Elbe, DO [facility]  PAGER is on [place]  If 7PM-7AM, please contact night-coverage [email or web address]

## 2020-01-12 NOTE — Plan of Care (Signed)
  Problem: Education: Goal: Knowledge of disease or condition will improve Outcome: Progressing  Patient  able to demonstrate techniques to monitor and manage left side limbs, such as lifting left foot with right or asking staff to check left limb.  Problem: Coping: Goal: Will identify appropriate support needs Outcome: Progressing  Patient able to ask staff for help when present.  Problem: Self-Care: Goal: Ability to participate in self-care as condition permits will improve Outcome: Progressing  Patient able to feed self.  Problem: Nutrition: Goal: Dietary intake will improve Outcome: Progressing  Patient consuming >50% of lunch.

## 2020-01-13 LAB — COMPREHENSIVE METABOLIC PANEL
ALT: 36 U/L (ref 0–44)
AST: 39 U/L (ref 15–41)
Albumin: 3.4 g/dL — ABNORMAL LOW (ref 3.5–5.0)
Alkaline Phosphatase: 71 U/L (ref 38–126)
Anion gap: 11 (ref 5–15)
BUN: 14 mg/dL (ref 6–20)
CO2: 23 mmol/L (ref 22–32)
Calcium: 9.3 mg/dL (ref 8.9–10.3)
Chloride: 104 mmol/L (ref 98–111)
Creatinine, Ser: 0.97 mg/dL (ref 0.61–1.24)
GFR calc Af Amer: >60 mL/min (ref >60)
GFR calc non Af Amer: >60 mL/min (ref >60)
Glucose, Bld: 96 mg/dL (ref 70–99)
Potassium: 4.3 mmol/L (ref 3.5–5.1)
Sodium: 138 mmol/L (ref 135–145)
Total Bilirubin: 0.8 mg/dL (ref 0.3–1.2)
Total Protein: 7.1 g/dL (ref 6.5–8.1)

## 2020-01-13 LAB — CBC WITH DIFFERENTIAL/PLATELET
Abs Immature Granulocytes: 0.06 10*3/uL (ref 0.00–0.07)
Basophils Absolute: 0 10*3/uL (ref 0.0–0.1)
Basophils Relative: 1 %
Eosinophils Absolute: 0.3 10*3/uL (ref 0.0–0.5)
Eosinophils Relative: 3 %
HCT: 44.7 % (ref 39.0–52.0)
Hemoglobin: 15 g/dL (ref 13.0–17.0)
Immature Granulocytes: 1 %
Lymphocytes Relative: 23 %
Lymphs Abs: 1.9 10*3/uL (ref 0.7–4.0)
MCH: 29.5 pg (ref 26.0–34.0)
MCHC: 33.6 g/dL (ref 30.0–36.0)
MCV: 87.8 fL (ref 80.0–100.0)
Monocytes Absolute: 0.9 10*3/uL (ref 0.1–1.0)
Monocytes Relative: 10 %
Neutro Abs: 5.3 10*3/uL (ref 1.7–7.7)
Neutrophils Relative %: 62 %
Platelets: 280 10*3/uL (ref 150–400)
RBC: 5.09 MIL/uL (ref 4.22–5.81)
RDW: 16.7 % — ABNORMAL HIGH (ref 11.5–15.5)
WBC: 8.4 10*3/uL (ref 4.0–10.5)
nRBC: 0 % (ref 0.0–0.2)

## 2020-01-13 LAB — PHOSPHORUS: Phosphorus: 4.1 mg/dL (ref 2.5–4.6)

## 2020-01-13 LAB — MAGNESIUM: Magnesium: 2.2 mg/dL (ref 1.7–2.4)

## 2020-01-13 MED ORDER — SODIUM CHLORIDE 0.9 % IV BOLUS
500.0000 mL | Freq: Once | INTRAVENOUS | Status: AC
  Administered 2020-01-13: 11:00:00 500 mL via INTRAVENOUS

## 2020-01-13 MED ORDER — LIDOCAINE 5 % EX PTCH
1.0000 | MEDICATED_PATCH | CUTANEOUS | Status: DC
  Administered 2020-01-13 – 2020-01-15 (×3): 1 via TRANSDERMAL
  Filled 2020-01-13 (×3): qty 1

## 2020-01-13 NOTE — Progress Notes (Signed)
PROGRESS NOTE    Derek [name]  YIR:[id number] DOB: 11/15/1975 DOA: 01/07/2020 PCP: [name], MD   Brief Narrative:  HPI per Dr. [name] on 01/07/20 Derek [name] is a 44 y.o. male with no known medical history presenting as Code Stroke.  The patient is mostly sleeping and awakes and appears appropriate but his conversation doesn't exactly make sense.  He complains of being cold - when I asked him to straighten his right leg out (it was behind his left leg), he says he was "just putting it behind this thing to keep it warm" and seems unaware completely of his left side.  He reports that he needs to call his work so he doesn't get fired, but then didn't actually want to use the phone. He reports headache.  I called and spoke with his sister.  She was notified this AM that he was taken here.  She reports that he went out Saturday night to a family member's birthday party; they took him home and he was very intoxicated.  He was placed on the bed after 0200.  His girlfriend noted him on the floor sometime later.  She found him there again sometime later and had someone help him into the bed.  He was not moving on one side.  He apparently fell back on the floor at some point.  This AM, she finally called 911.  He has chronic back pain from a remote accident.  He has been under a lot of stress - including with his live-in girlfriend.  He also has a 24yo son.   ED Course:  EMS called for a fall, noted left-sided weakness, facial droop, slurred speech, rightward gaze.  CT consistent with R MCA CVA.  Neuro has seen, MRI recommended.  Temp 100.5, given empiric abx, no symptoms or apparent source.  **Interim History Currently being worked up for stroke and currently there is no definitive cause so patient undergoing a TEE which was done this morning and showed no LA/LAA thrombus and no PFO by color bubble study in the normal left ventricular EF of 60 to 65% with no significant valvular heart  disease.  He did have a hemorrhagic conversion and neurology does recommend no dual antiplatelet therapy because of the hemorrhagic conversion.  Currently on aspirin monotherapy for now.  PT/OT recommending CIR at discharge and currently after discussion with the case manager and the social worker patient cannot be discharged until at least Monday until insurance is obtained and see if they will approve a CIR stay. Currently does not have safe disposition otherwise she will need to remain in the hospital for now.  Hospitalization has been complicated by back pain, mild hypotension, as well as an unwitnessed fall and a mild fever and leukocytosis.  Assessment & Plan:   Principal Problem:   Acute ischemic right MCA stroke (HCC) Active Problems:   Tobacco dependence   Marijuana dependence ([place])   Alcohol dependence ([place])   Cerebrovascular accident (CVA) due to thrombosis of right middle cerebral artery (HCC)   Chronic pain syndrome   Hypokalemia   Dysphagia, post-stroke   Leukocytosis  Acute CVA with Cerebral Edema with Hemorrhagic conversion of the CVA -Patient was last seen normal at approximately 2 AM on Sunday morning, and has had recurrent falls; patient fell again yesterday -With significant left-sided deficits and cognitive impairment, as well as lethargy on examination which is improved since yesterday -CT head: Recent M1 segment, right middle cerebral artery infarct with mass-effect  and possible petechial hemorrhage in portions of the right basal ganglia.  Proximal extent of postero superior right frontal and adjacent right parietal lobes. -CTA neck: neck no large vessel occlusion or significant stenosis -MRI head showed acute right MCA territory going basal ganglia and right parietal lobe.  Hemorrhagic transformation of the right basal ganglia infarct with mass-effect on the lateral ventricle -Echocardiogram showed EF of 59-56%, LV diastolic parameters normal. -Lower extremity Doppler  showed no evidence of DVT -Carotid doppler: B/L 1-39% ICA stenosis.  Bilateral vertebral arteries demonstrate antegrade flow -LDL 138, hemoglobin A1c 5.6 -Full lipid panel showed a total cholesterol/HDL ratio of 4.3, cholesterol level 204, HDL 47, LDL 138, triglycerides of 94, VLDL 19 -Continue with Atorvastatin 40 mg p.o. daily -Neurology had considered 3% saline but not needed at this time and repeat CT 3/11 showed "Evolving cerebral infarction with hemorrhagic conversion and mild leftward midline shift. No new hemorrhage, hydrocephalus, or worsening mass effect." -Repeat head CT scan 3/10 showed "Evolving right cerebral infarct with evidence for hemorrhagic transformation at the right basal ganglia, similar in appearance and distribution as compared to previous MRI. Associated mass effect with 3 mm of right-to-left shift. No hydrocephalus or ventricular trapping. 2. No other new acute intracranial abnormality" -Neurology consulted and appreciated' -Dr. Erlinda Hong recommends TEE which was done today and showed no LAA/LAA thrombus and no PFO by color bubble study.  The left ventricular EF was normal with the ejection fraction of 60 to 65% and there is no significant valvular heart disease noted -Hypercoagulable work-up ordered and his PTT lupus anticoagulant, DRVVT was negative and his ANA antibody was negative -Continue Aspirin 325 mg daily for now.  Holding DAPT given his hemorrhagic transformation. -Neurology recommending now blood pressure less than 160 given his hemorrhagic conversion the long-term blood pressure goal normotensive -PT and OT recommending CIR and CIR was consulted for further evaluation recommendations -SLP evaluated and recommended dysphagia 2 diet with nectar thick liquids and are going to defer recommending instrumental swallow assessment until after the TEE; SLP evaluated and the patient now on a dysphagia 3 mechanical soft diet with nectar thick liquids -Continue monitor and  follow-up on SLP recommendations -CIR Physician Dr. Posey Pronto was consulted and feels that the patient would be a good candidate for CIR -Patient is to be discharged to CIR however unfortunately cannot go until at least Monday per my discussion with the transition of care team given the patient's insurance (Lack of currently and awaiting to see if they will cover stay) and that he has no other safe discharge disposition at this time he will need to remain in the hospital until that timeframe. -Neurology has signed off the case now and recommends a 30-day CardioNet monitor as an outpatient to rule out if atrial fibrillation -Dr. Erlinda Hong recommends follow-up with the stroke clinic at [facility] 4 weeks after discharge from rehab  Essential Hypertension -> Hypotension -Placed on Amlodipine 10 mg po Daily but will hold due to Hypotension   -Currently  Was on metoprolol tartrate 5 mg IV every 12 but will stop -BP goal less than 160 given hemorrhagic conversion -He was ordered labetalol 20 mg IV once but was not given due to parameters not being met -BP is on the lower side and was 91/53  Alcohol Dependence -Continue CIWA protocol with IV lorazepam -No signs of withdrawal -Continue Folic acid, Multivitamin + Minerals, and Thiamine supplementation  Marijuana Dependence -UDS positive for THC -Continue with Cessation efforts   Tobacco Dependence -  Continue Nicotine 7 mg TD Patch -Smoking Cessation Counseling given    SIRS with a Recurrent Fever and now leukocytosis -Patient with leukocytosis of 14.3 on admission and temperature of 100.5 F but had imporved. WBC was trending up and went from 9.0 -> 9.8 -> 10.7 -> 8.4; patien T-max of 100.6 recently but has been afebrile for the last 24-48 hours -UA unremarkable for infection, urine culture shows no growth -Blood cultures pending showed no growth to date at 5 days -Suspect secondary to the above but if continues to spike  temperatures again we will panculture again as his WBC is worsening  -Continue to monitor and trend temperature curve -Currently patient is denying any shortness of breath, burning or discomfort in his urine or any other complaints and has no diarrhea -Repeat CBC in AM   Hypokalemia -Improved as patient's potassium is now 4.3 -Continue to Monitor and Replete as Necessary -Repeat CMP intermittently  HLD -Full lipid panel showed a total cholesterol/HDL ratio of 4.3, cholesterol level 204, HDL 47, LDL 138, triglycerides of 94, VLDL 19 -His ALT was mildly elevated at 43 yesterday and today it was 46 and will need to continue to monitor -Continue with Atorvastatin 40 mg p.o. daily  Elevated ALT -Mild and went to 46 but is now improved and is 39 -Continue monitor and trend hepatic function panel -If worsening or not improving will need to obtain a right upper quadrant ultrasound and acute hepatitis panel -Repeat CMP in the AM  Headache, improving -Secondary to above -Not amenable acetaminophen so we will try oxycodone as we will try and avoid NSAIDs due to hemorrhagic conversion  Unwitnessed Fall -Patient fell getting out of chair and hit his Head per Nursing the day before yesterday -Ordered Stat Head CT w/o Contrast as he has had a CVA with Hemorrhagic Conversion; Repeat CT showed "Evolving cerebral infarction with hemorrhagic conversion and mild leftward midline shift. No new hemorrhage, hydrocephalus, or worsening mass effect." -Notified Neurology and had no additional Recommendations -C/w Fall Precautions   Fever, improved now -Mild Temperature of 100.6 the night before last and had a temperature of 100.5 on admission -Patient is Afebrile now -Continue to Monitor for S/Sx of Infection -If Spikes a Fever again will  -Blood Cx from 01/07/20 showed NGTD at 5 Days -Repeat blood cultures if spikes another temperature; T-max over last 24 hours was 99.3 -We will obtain a chest x-ray in  the a.m. -Currently mild leukocytosis has improved as WBC went from 10.7 -> 8.4 -Continue monitor and trend temperature curve and WBC and repeat CBC in a.m.  Back Pain -Patient was unable to be comfortable as he been laying in the bed -We will try lidocaine patch -If continues to worsen will obtain a lumbar x-ray  Metabolic Acidosis -Was mild and is improved now -Patient's CO2 is now 23, anion gap is 11 and chloride level is 104 -Continue to monitor and trend and repeat CMP intermittently  DVT prophylaxis: SCDs given his hemorrhagic Transformation but now he is back on enoxaparin 40 mg subcu every 24 after Neurology is restarted this. Code Status: FULL CODE  Family Communication: No family present at bedside Disposition Plan: Patient is from home and had a CVA which then had hemorrhagic conversion and requires CIR at discharge.  Patient was fully worked up from a neurological standpoint and had his TEE done however is now spiking mild temperatures intermittently and has a slight leukocytosis.  Will need to have insurance approval before discharge to  CIR and this will not happen until Monday, 01/14/2020   Consultants:   Neurology  Cardiology for TEE    Procedures:  ECHOCARDIOGRAM IMPRESSIONS    1. Left ventricular ejection fraction, by estimation, is 60 to 65%. The  left ventricle has normal function. The left ventricle has no regional  wall motion abnormalities. Left ventricular diastolic parameters were  normal.  2. Right ventricular systolic function is normal. The right ventricular  size is normal.  3. The mitral valve is normal in structure. No evidence of mitral valve  regurgitation. No evidence of mitral stenosis.  4. The aortic valve is normal in structure. Aortic valve regurgitation is  not visualized. No aortic stenosis is present.  5. The inferior vena cava is normal in size with greater than 50%  respiratory variability, suggesting right atrial pressure of 3  mmHg.   FINDINGS  Left Ventricle: Left ventricular ejection fraction, by estimation, is 60  to 65%. The left ventricle has normal function. The left ventricle has no  regional wall motion abnormalities. The left ventricular internal cavity  size was normal in size. There is  no left ventricular hypertrophy. Left ventricular diastolic parameters  were normal. Normal left ventricular filling pressure.   Right Ventricle: The right ventricular size is normal. No increase in  right ventricular wall thickness. Right ventricular systolic function is  normal.   Left Atrium: Left atrial size was normal in size.   Right Atrium: Right atrial size was normal in size.   Pericardium: There is no evidence of pericardial effusion.   Mitral Valve: The mitral valve is normal in structure. Normal mobility of  the mitral valve leaflets. No evidence of mitral valve regurgitation. No  evidence of mitral valve stenosis.   Tricuspid Valve: The tricuspid valve is normal in structure. Tricuspid  valve regurgitation is not demonstrated. No evidence of tricuspid  stenosis.   Aortic Valve: The aortic valve is normal in structure. Aortic valve  regurgitation is not visualized. No aortic stenosis is present.   Pulmonic Valve: The pulmonic valve was normal in structure. Pulmonic valve  regurgitation is not visualized. No evidence of pulmonic stenosis.   Aorta: The aortic root is normal in size and structure.   Venous: The inferior vena cava is normal in size with greater than 50%  respiratory variability, suggesting right atrial pressure of 3 mmHg.   IAS/Shunts: No atrial level shunt detected by color flow Doppler.     LEFT VENTRICLE  PLAX 2D  LVIDd:     5.40 cm Diastology  LVIDs:     3.20 cm LV e' lateral:  12.30 cm/s  LV PW:     1.00 cm LV E/e' lateral: 4.9  LV IVS:    0.90 cm LV e' medial:  10.20 cm/s  LVOT diam:   2.20 cm LV E/e' medial: 5.9  LV SV:     61  LV  SV Index:  27  LVOT Area:   3.80 cm     RIGHT VENTRICLE  RV S prime:   12.50 cm/s  TAPSE (M-mode): 2.2 cm   LEFT ATRIUM       Index    RIGHT ATRIUM      Index  LA diam:    3.40 cm 1.50 cm/m RA Area:   14.80 cm  LA Vol (A2C):  46.5 ml 20.55 ml/m RA Volume:  33.20 ml 14.67 ml/m  LA Vol (A4C):  20.4 ml 9.01 ml/m  LA Biplane Vol: 30.8 ml 13.61 ml/m  AORTIC VALVE  LVOT Vmax:  86.80 cm/s  LVOT Vmean: 51.000 cm/s  LVOT VTI:  0.161 m    AORTA  Ao Root diam: 3.10 cm   MITRAL VALVE  MV Area (PHT): 3.31 cm  SHUNTS  MV Decel Time: 229 msec  Systemic VTI: 0.16 m  MV E velocity: 60.40 cm/s Systemic Diam: 2.20 cm  MV A velocity: 52.70 cm/s  MV E/A ratio: 1.15   LOWER EXTREMITY DUPLEX RIGHT:  - There is no evidence of deep vein thrombosis in the lower extremity.    - No cystic structure found in the popliteal fossa.    LEFT:  - There is no evidence of deep vein thrombosis in the lower extremity.    - No cystic structure found in the popliteal fossa.   TEE 01/10/20 1. No LAA/LA thrombus.  2. No PFO by color or bubble study.  3. LVEF normal 60-65%. 4. No significant valvular heart disease.   IMPRESSIONS    1. Left ventricular ejection fraction, by estimation, is 60 to 65%. The  left ventricle has normal function. The left ventricle has no regional  wall motion abnormalities.  2. Right ventricular systolic function is normal. The right ventricular  size is normal.  3. No left atrial/left atrial appendage thrombus was detected. The LAA  emptying velocity was 87 cm/s.  4. Redundant chordal tissue present. The mitral valve is normal in  structure. Trivial mitral valve regurgitation. No evidence of mitral  stenosis.  5. The aortic valve is tricuspid. Aortic valve regurgitation is not  visualized. No aortic stenosis is present.  6. Agitated saline contrast bubble study was negative, with no evidence  of any interatrial  shunt.   Conclusion(s)/Recommendation(s): Normal biventricular function without  evidence of hemodynamically significant valvular heart disease. No LA/LAA  thrombus identified. Negative bubble study for interatrial shunt. No  intracardiac source of embolism detected  on this on this transesophageal echocardiogram.   FINDINGS  Left Ventricle: Left ventricular ejection fraction, by estimation, is 60  to 65%. The left ventricle has normal function. The left ventricle has no  regional wall motion abnormalities. The left ventricular internal cavity  size was normal in size. There is  no left ventricular hypertrophy.   Right Ventricle: The right ventricular size is normal. No increase in  right ventricular wall thickness. Right ventricular systolic function is  normal.   Left Atrium: Left atrial size was normal in size. No left atrial/left  atrial appendage thrombus was detected. The LAA emptying velocity was 87  cm/s.   Right Atrium: Right atrial size was normal in size.   Pericardium: Trivial pericardial effusion is present. The pericardial  effusion is circumferential.   Mitral Valve: Redundant chordal tissue present. The mitral valve is normal  in structure. Trivial mitral valve regurgitation. No evidence of mitral  valve stenosis.   Tricuspid Valve: The tricuspid valve is normal in structure. Tricuspid  valve regurgitation is trivial. No evidence of tricuspid stenosis.   Aortic Valve: The aortic valve is tricuspid. Aortic valve regurgitation is  not visualized. No aortic stenosis is present.   Pulmonic Valve: The pulmonic valve was normal in structure. Pulmonic valve  regurgitation is trivial. No evidence of pulmonic stenosis.   Aorta: The aortic root, ascending aorta and aortic arch are all  structurally normal, with no evidence of dilitation or obstruction.   Venous: The right upper pulmonary vein and left upper pulmonary vein are  normal.   IAS/Shunts: No atrial  level shunt detected  by color flow Doppler. Agitated  saline contrast was given intravenously to evaluate for intracardiac  shunting. Agitated saline contrast bubble study was negative, with no  evidence of any interatrial shunt.   EKG: Rhythm strip during this exam demostrated normal sinus rhythm.       AORTA  Ao Root diam: 3.60 cm  Ao Asc diam: 3.00 cm   TRICUSPID VALVE  TR Peak grad:  9.0 mmHg  TR Vmax:    150.00 cm/s      Antimicrobials: Anti-infectives (From admission, onward)   Start     Dose/Rate Route Frequency Ordered Stop   01/07/20 2200  vancomycin (VANCOREADY) IVPB 1500 mg/300 mL  Status:  Discontinued     1,500 mg 150 mL/hr over 120 Minutes Intravenous Every 12 hours 01/07/20 0834 01/07/20 1332   01/07/20 1400  ceFEPIme (MAXIPIME) 2 g in sodium chloride 0.9 % 100 mL IVPB  Status:  Discontinued     2 g 200 mL/hr over 30 Minutes Intravenous Every 8 hours 01/07/20 0834 01/07/20 1332   01/07/20 0830  ceFEPIme (MAXIPIME) 2 g in sodium chloride 0.9 % 100 mL IVPB     2 g 200 mL/hr over 30 Minutes Intravenous  Once 01/07/20 0826 01/07/20 0932   01/07/20 0830  metroNIDAZOLE (FLAGYL) IVPB 500 mg     500 mg 100 mL/hr over 60 Minutes Intravenous  Once 01/07/20 0826 01/07/20 1041   01/07/20 0830  vancomycin (VANCOCIN) IVPB 1000 mg/200 mL premix  Status:  Discontinued     1,000 mg 200 mL/hr over 60 Minutes Intravenous  Once 01/07/20 0826 01/07/20 0828   01/07/20 0830  vancomycin (VANCOREADY) IVPB 2000 mg/400 mL     2,000 mg 200 mL/hr over 120 Minutes Intravenous  Once 01/07/20 0828 01/07/20 1135     Subjective: Seen and examined at bedside and appeared very uncomfortable and had shifted his body position and contorted it laying on his left side.  States that he cannot get comfortable due to back pain.  No nausea or vomiting.  Needed to use the restroom.  No other concerns or complaints at this time but wanted nursing assistance to be repositioned  Objective:  Vitals:   01/12/20 1704 01/12/20 2001 01/12/20 2321 01/13/20 0418  BP: 101/66 114/73 111/70 (!) 91/53  Pulse:  85 75 80  Resp: 16 18 15 18   Temp:  98.6 F (37 C) 98.2 F (36.8 C) 98 F (36.7 C)  TempSrc:  Oral Oral Oral  SpO2:  98% 99% 99%  Weight:      Height:        Intake/Output Summary (Last 24 hours) at 01/13/2020 1411 Last data filed at 01/13/2020 0645 Gross per 24 hour  Intake -  Output 1200 ml  Net -1200 ml   Filed Weights   01/07/20 0659  Weight: 99.8 kg   Examination: Physical Exam:  Constitutional: WN/WD overweight African-American male who appears extremely uncomfortable is complaining of some lower back pain Eyes: Lids and conjunctivae normal, sclerae anicteric  ENMT: External Ears, Nose appear normal. Grossly normal hearing.  Continue to have a left facial droop Neck: Appears normal, supple, no cervical masses, normal ROM, no appreciable thyromegaly; no JVD Respiratory: Slightly diminished to auscultation bilaterally, no wheezing, rales, rhonchi or crackles. Normal respiratory effort and patient is not tachypenic. No accessory muscle use.  Unlabored breathing Cardiovascular: RRR, no murmurs / rubs / gallops. S1 and S2 auscultated.  Mild lower extremity edema Abdomen: Soft, non-tender, non-distended. Bowel sounds positive.  GU:  Deferred.  Wearing a condom cath Musculoskeletal: No clubbing / cyanosis of digits/nails. No joint deformity upper and lower extremities.  Skin: No rashes, lesions, ulcers on limited skin evaluation. No induration; Warm and dry.  Neurologic: CN 2-12 grossly intact with no focal deficits but has some left-sided weakness and hemiplegia.  Romberg sign and cerebellar reflexes not assessed.  Psychiatric: Normal judgment and insight. Alert and oriented x 3.  Anxious and slightly frustrated mood and appropriate affect.   Data Reviewed: I have personally reviewed following labs and imaging studies  CBC: Recent Labs  Lab 01/07/20 0703 01/07/20  0703 01/09/20 0429 01/10/20 0517 01/11/20 0415 01/12/20 0404 01/13/20 0416  WBC 14.3*   < > 9.2 9.0 9.8 10.7* 8.4  NEUTROABS 12.6*  --   --  5.9 6.9 7.3 5.3  HGB 14.8   < > 14.2 13.8 14.0 14.7 15.0  HCT 44.7   < > 42.4 41.0 42.8 44.1 44.7  MCV 88.9   < > 88.1 87.8 89.2 89.3 87.8  PLT 314   < > 256 233 267 265 280   < > = values in this interval not displayed.   Basic Metabolic Panel: Recent Labs  Lab 01/07/20 0703 01/07/20 1644 01/09/20 0429 01/10/20 0517 01/11/20 0415 01/12/20 0404 01/13/20 0416  NA   < >  --  140 138 138 138 138  K   < >  --  3.4* 3.9 4.1 4.1 4.3  CL   < >  --  107 108 103 106 104  CO2   < >  --  24 22 21* 21* 23  GLUCOSE   < >  --  98 101* 84 94 96  BUN   < >  --  9 10 11 13 14   CREATININE   < >  --  0.88 0.92 0.89 0.90 0.97  CALCIUM   < >  --  8.9 8.8* 9.2 9.0 9.3  MG  --  2.1  --  2.1 2.2 2.2 2.2  PHOS  --  3.3  --  3.6 4.1 4.7* 4.1   < > = values in this interval not displayed.   GFR: Estimated Creatinine Clearance: 122.6 mL/min (by C-G formula based on SCr of 0.97 mg/dL). Liver Function Tests: Recent Labs  Lab 01/07/20 0703 01/10/20 0517 01/11/20 0415 01/12/20 0404 01/13/20 0416  AST 43* 43* 46* 40 39  ALT 31 33 33 39 36  ALKPHOS 85 64 67 71 71  BILITOT 0.8 0.5 0.8 0.6 0.8  PROT 7.5 6.4* 6.8 7.0 7.1  ALBUMIN 4.2 3.2* 3.4* 3.5 3.4*   No results for input(s): LIPASE, AMYLASE in the last 168 hours. No results for input(s): AMMONIA in the last 168 hours. Coagulation Profile: Recent Labs  Lab 01/07/20 0703  INR 1.0   Cardiac Enzymes: Recent Labs  Lab 01/07/20 0703  CKTOTAL 787*   BNP (last 3 results) No results for input(s): PROBNP in the last 8760 hours. HbA1C: No results for input(s): HGBA1C in the last 72 hours. CBG: No results for input(s): GLUCAP in the last 168 hours. Lipid Profile: No results for input(s): CHOL, HDL, LDLCALC, TRIG, CHOLHDL, LDLDIRECT in the last 72 hours. Thyroid Function Tests: No results for  input(s): TSH, T4TOTAL, FREET4, T3FREE, THYROIDAB in the last 72 hours. Anemia Panel: No results for input(s): VITAMINB12, FOLATE, FERRITIN, TIBC, IRON, RETICCTPCT in the last 72 hours. Sepsis Labs: Recent Labs  Lab 01/07/20 0811  LATICACIDVEN 1.9    Recent Results (from  the past 240 hour(s))  Blood culture (routine x 2)     Status: None   Collection Time: 01/07/20  8:07 AM   Specimen: BLOOD RIGHT ARM  Result Value Ref Range Status   Specimen Description BLOOD RIGHT ARM  Final   Special Requests   Final    BOTTLES DRAWN AEROBIC AND ANAEROBIC Blood Culture adequate volume   Culture   Final    NO GROWTH 5 DAYS Performed at [facility], [address], [place], [place] [address]    Report Status 01/12/2020 FINAL  Final  SARS CORONAVIRUS 2 (TAT 6-24 HRS) Nasopharyngeal Nasopharyngeal Swab     Status: None   Collection Time: 01/07/20  9:34 AM   Specimen: Nasopharyngeal Swab  Result Value Ref Range Status   SARS Coronavirus 2 NEGATIVE NEGATIVE Final    Comment: (NOTE) SARS-CoV-2 target nucleic acids are NOT DETECTED. The SARS-CoV-2 RNA is generally detectable in upper and lower respiratory specimens during the acute phase of infection. Negative results do not preclude SARS-CoV-2 infection, do not rule out co-infections with other pathogens, and should not be used as the sole basis for treatment or other patient management decisions. Negative results must be combined with clinical observations, patient history, and epidemiological information. The expected result is Negative. Fact Sheet for Patients: SugarRoll.be Fact Sheet for Healthcare Providers: https://www.woods-mathews.com/ This test is not yet approved or cleared by the [place] FDA and  has been authorized for detection and/or diagnosis of SARS-CoV-2 by FDA under an Emergency Use Authorization (EUA). This EUA will remain  in effect (meaning this test can be used) for  the duration of the COVID-19 declaration under Section 56 4(b)(1) of the Act, 21 U.S.C. section 360bbb-3(b)(1), unless the authorization is terminated or revoked sooner. Performed at [facility], [place] [address], [place], [place] [address]   Urine culture     Status: None   Collection Time: 01/07/20 10:47 AM   Specimen: In/Out Cath Urine  Result Value Ref Range Status   Specimen Description IN/OUT CATH URINE  Final   Special Requests NONE  Final   Culture   Final    NO GROWTH Performed at [facility], [place] [address], [place], [place] [address]    Report Status 01/08/2020 FINAL  Final     RN Pressure Injury Documentation:     Estimated body mass index is 28.25 kg/m as calculated from the following:   Height as of this encounter: 6' 2"  (1.88 m).   Weight as of this encounter: 99.8 kg.  Malnutrition Type:  Nutrition Problem: Predicted suboptimal nutrient intake Etiology: decreased appetite   Malnutrition Characteristics:  Signs/Symptoms: other (comment)(consult s/p stroke)   Nutrition Interventions:  Interventions: Ensure Enlive (each supplement provides 350kcal and 20 grams of protein), MVI   Radiology Studies: No results found. Scheduled Meds: . amLODipine  10 mg Oral Daily  . aspirin  300 mg Rectal Daily   Or  . aspirin  325 mg Oral Daily  . atorvastatin  40 mg Oral q1800  . enoxaparin (LOVENOX) injection  40 mg Subcutaneous Q24H  . feeding supplement (ENSURE ENLIVE)  237 mL Oral BID BM  . folic acid  1 mg Oral Daily  . labetalol  20 mg Intravenous Once  . lidocaine  1 patch Transdermal Q24H  . multivitamin with minerals  1 tablet Oral Daily  . nicotine  7 mg Transdermal Daily  . thiamine  100 mg Oral Daily   Or  .  thiamine  100 mg Intravenous Daily   Continuous Infusions: . sodium chloride 50 mL/hr at 01/09/20 1343    LOS: 6 days   Kerney Elbe, DO [facility] PAGER is on AMION  If 7PM-7AM, please contact night-coverage  [email or web address]

## 2020-01-14 ENCOUNTER — Inpatient Hospital Stay (HOSPITAL_COMMUNITY): Payer: BC Managed Care – PPO

## 2020-01-14 LAB — CBC WITH DIFFERENTIAL/PLATELET
Abs Immature Granulocytes: 0.05 10*3/uL (ref 0.00–0.07)
Basophils Absolute: 0 10*3/uL (ref 0.0–0.1)
Basophils Relative: 1 %
Eosinophils Absolute: 0.2 10*3/uL (ref 0.0–0.5)
Eosinophils Relative: 3 %
HCT: 43.3 % (ref 39.0–52.0)
Hemoglobin: 14.6 g/dL (ref 13.0–17.0)
Immature Granulocytes: 1 %
Lymphocytes Relative: 26 %
Lymphs Abs: 1.8 10*3/uL (ref 0.7–4.0)
MCH: 29.6 pg (ref 26.0–34.0)
MCHC: 33.7 g/dL (ref 30.0–36.0)
MCV: 87.8 fL (ref 80.0–100.0)
Monocytes Absolute: 0.8 10*3/uL (ref 0.1–1.0)
Monocytes Relative: 11 %
Neutro Abs: 4.1 10*3/uL (ref 1.7–7.7)
Neutrophils Relative %: 58 %
Platelets: 276 10*3/uL (ref 150–400)
RBC: 4.93 MIL/uL (ref 4.22–5.81)
RDW: 16.5 % — ABNORMAL HIGH (ref 11.5–15.5)
WBC: 7 10*3/uL (ref 4.0–10.5)
nRBC: 0 % (ref 0.0–0.2)

## 2020-01-14 NOTE — Progress Notes (Signed)
Physical Therapy Treatment Patient Details Name: Derek [name] MRN: [id number] DOB: 1976-08-07 Today's Date: 01/14/2020    History of Present Illness 44 yo male no known medical history presenting as Code Stroke.  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 The pt continues to present with limitations in functional mobility, strength, and functional stability compared to their prior level of function and independence due to above dx. The pt was able to demonstrate good functional leg strength through multiple repetitions of sit-stand transfers from various heights and surfaces. Pt was able to increase  Wt bearing through LLE in functional transfers as well as with standing wt shifts to facilitate lateral wt shift for gait training. The pt would benefit from skilled PT to further maximize rehab potential and functional independence prior to d/c./    Follow Up Recommendations  CIR;Supervision/Assistance - 24 hour     Equipment Recommendations  Wheelchair (measurements PT);Wheelchair cushion (measurements PT)    Recommendations for Other Services       Precautions / Restrictions Precautions Precautions: Fall Precaution Comments: L neglect, hemiparesis Required Braces or Orthoses: Other Brace Other Brace: L knee immobilizer Restrictions Weight Bearing Restrictions: No    Mobility  Bed Mobility Overal bed mobility: Needs Assistance Bed Mobility: Sit to Supine     Supine to sit: Min assist     General bed mobility comments: Cues for hooking RLE under LLE, assist to elevate trunk to sitting  Transfers Overall transfer level: Needs assistance Equipment used: Ambulation equipment  used Transfers: Sit to/from Stand Sit to Stand: Min guard;From elevated surface         General transfer comment: pt needing minG only with VCs for hand placement with sit-stand from multiple surfaces in stedy (bed, stedy flaps, and recliner) pt able to perform with good eccentric control. x15 through session  Ambulation/Gait Ambulation/Gait assistance: (not attempted today due to focus on standing balance/wt shift)               Stairs             Wheelchair Mobility    Modified Rankin (Stroke Patients Only) Modified Rankin (Stroke Patients Only) Pre-Morbid Rankin Score: No symptoms Modified Rankin: Severe disability     Balance Overall balance assessment: Needs assistance Sitting-balance support: Single extremity supported;Feet supported Sitting balance-Leahy Scale: Fair Sitting balance - Comments: Close supervision Postural control: Left lateral lean Standing balance support: Single extremity supported;During functional activity Standing balance-Leahy Scale: Poor Standing balance comment: maxA for standing in Stedy with heavy left lateral lean                            Cognition Arousal/Alertness: Awake/alert Behavior During Therapy: Flat affect;WFL for tasks assessed/performed Overall Cognitive Status: Impaired/Different from baseline Area of Impairment: Awareness;Safety/judgement                         Safety/Judgement: Decreased awareness of safety;Decreased awareness of deficits Awareness: Intellectual   General Comments: Pt able to follow commands, cues for attending L side, responding only when asked a direct questions, otherwise not very talkative      Exercises Other Exercises  Other Exercises: standing wt shifts with tactile cues at hips x10 Other Exercises: standing reaches x 5 Other Exercises: sit-stand with slowed eccentric lower x 10    General Comments        Pertinent Vitals/Pain Pain Assessment: No/denies  pain Pain Intervention(s): Limited activity within patient's tolerance;Monitored during session    Home Living                      Prior Function            PT Goals (current goals can now be found in the care plan section) Acute Rehab PT Goals Patient Stated Goal: get to rehab PT Goal Formulation: With patient Time For Goal Achievement: 01/22/20 Potential to Achieve Goals: Good Progress towards PT goals: Progressing toward goals    Frequency    Min 4X/week      PT Plan Current plan remains appropriate    Co-evaluation              AM-PAC PT "6 Clicks" Mobility   Outcome Measure  Help needed turning from your back to your side while in a flat bed without using bedrails?: A Little Help needed moving from lying on your back to sitting on the side of a flat bed without using bedrails?: A Little Help needed moving to and from a bed to a chair (including a wheelchair)?: Total Help needed standing up from a chair using your arms (e.g., wheelchair or bedside chair)?: A Little Help needed to walk in hospital room?: Total Help needed climbing 3-5 steps with a railing? : Total 6 Click Score: 12    End of Session Equipment Utilized During Treatment: Gait belt Activity Tolerance: Patient tolerated treatment well Patient left: in chair;with chair alarm set Nurse Communication: Mobility status(need for stedy to return to bed) PT Visit Diagnosis: Other abnormalities of gait and mobility (R26.89);Other symptoms and signs involving the nervous system (R29.898);Hemiplegia and hemiparesis Hemiplegia - Right/Left: Left Hemiplegia - dominant/non-dominant: Non-dominant Hemiplegia - caused by: Cerebral infarction;Nontraumatic intracerebral hemorrhage     Time: 8366-2947 PT Time Calculation (min) (ACUTE ONLY): 40 min  Charges:  $Therapeutic Exercise: 8-22 mins $Therapeutic Activity: 8-22 mins $Neuromuscular Re-education: 8-22 mins                     Hardie Pulley,  DPT   Acute Rehabilitation Department Pager #: [phone]   Otho Bellows 01/14/2020, 4:13 PM

## 2020-01-14 NOTE — Plan of Care (Signed)
  Problem: Education: Goal: Knowledge of disease or condition will improve Outcome: Progressing Goal: Knowledge of secondary prevention will improve Outcome: Progressing Goal: Knowledge of patient specific risk factors addressed and post discharge goals established will improve Outcome: Progressing   Problem: Education: Goal: Knowledge of disease or condition will improve Outcome: Progressing Goal: Knowledge of secondary prevention will improve Outcome: Progressing Goal: Knowledge of patient specific risk factors addressed and post discharge goals established will improve Outcome: Progressing Goal: Individualized Educational Video(s) Outcome: Progressing   Problem: Coping: Goal: Will verbalize positive feelings about self Outcome: Progressing Goal: Will identify appropriate support needs Outcome: Progressing   Problem: Health Behavior/Discharge Planning: Goal: Ability to manage health-related needs will improve Outcome: Progressing   Problem: Self-Care: Goal: Ability to participate in self-care as condition permits will improve Outcome: Progressing Goal: Verbalization of feelings and concerns over difficulty with self-care will improve Outcome: Progressing Goal: Ability to communicate needs accurately will improve Outcome: Progressing   Problem: Nutrition: Goal: Risk of aspiration will decrease Outcome: Progressing Goal: Dietary intake will improve Outcome: Progressing   Problem: Intracerebral Hemorrhage Tissue Perfusion: Goal: Complications of Intracerebral Hemorrhage will be minimized Outcome: Progressing   Problem: Ischemic Stroke/TIA Tissue Perfusion: Goal: Complications of ischemic stroke/TIA will be minimized Outcome: Progressing   Problem: Education: Goal: Knowledge of General Education information will improve Description: Including pain rating scale, medication(s)/side effects and non-pharmacologic comfort measures Outcome: Progressing   Problem: Health  Behavior/Discharge Planning: Goal: Ability to manage health-related needs will improve Outcome: Progressing   Problem: Clinical Measurements: Goal: Ability to maintain clinical measurements within normal limits will improve Outcome: Progressing Goal: Will remain free from infection Outcome: Progressing Goal: Diagnostic test results will improve Outcome: Progressing Goal: Respiratory complications will improve Outcome: Progressing Goal: Cardiovascular complication will be avoided Outcome: Progressing   Problem: Activity: Goal: Risk for activity intolerance will decrease Outcome: Progressing   Problem: Nutrition: Goal: Adequate nutrition will be maintained Outcome: Progressing   Problem: Coping: Goal: Level of anxiety will decrease Outcome: Progressing   Problem: Elimination: Goal: Will not experience complications related to bowel motility Outcome: Progressing Goal: Will not experience complications related to urinary retention Outcome: Progressing   Problem: Pain Managment: Goal: General experience of comfort will improve Outcome: Progressing   Problem: Safety: Goal: Ability to remain free from injury will improve Outcome: Progressing   Problem: Skin Integrity: Goal: Risk for impaired skin integrity will decrease Outcome: Progressing

## 2020-01-14 NOTE — Progress Notes (Signed)
Modified Barium Swallow Progress Note  Patient Details  Name: JOHNMICHAEL MELHORN MRN: [id number] Date of Birth: [birth year]  Today's Date: 01/14/2020  Modified Barium Swallow completed.  Full report located under Chart Review in the Imaging Section.  Brief recommendations include the following:  Clinical Impression  Pt demonstrates a mild dysphagia with instances of left anterior bolus loss and fairly consistent delay in swallow initiation to pyriform sinuses. Despite these deficits, pt was able to orally manage thin liquids with a straw and masticate solids without oral residue. Pt only had instances of trace flash penetration, and one very trace frank penetration event with sensation, even when challenged with large consecutive sips.  Pt is able to upgrade diet to regular solids and thin liquids. Will f/u for tolerance and compensatory strategies as needed.    Swallow Evaluation Recommendations       SLP Diet Recommendations: Regular solids;Thin liquid   Liquid Administration via: Cup;Straw   Medication Administration: Whole meds with puree   Supervision: Staff to assist with self feeding   Compensations: Slow rate;Small sips/bites;Lingual sweep for clearance of pocketing   Postural Changes: Seated upright at 90 degrees   Oral Care Recommendations: Oral care BID       Harlon Ditty, MA CCC-SLP  Acute Rehabilitation Services Pager [phone] Office [phone]  Claudine Mouton 01/14/2020,1:24 PM

## 2020-01-14 NOTE — Progress Notes (Signed)
Nutrition Follow-up  **RD working remotely**  DOCUMENTATION CODES:   Not applicable  INTERVENTION:   D/c Ensure Enlive po BID, each supplement provides 350 kcal and 20 grams of protein  Vital Cuisine shake po BID, each supplement provides 500 kcal and 22 grams of protein  Continue MVI daily   NUTRITION DIAGNOSIS:   Inadequate oral intake related to poor appetite as evidenced by meal completion < 50%.   GOAL:   Patient will meet greater than or equal to 90% of their needs  Progressing.  MONITOR:   PO intake, Supplement acceptance, Labs, Weight trends  REASON FOR ASSESSMENT:   Consult Other (Comment)(stroke)  ASSESSMENT:   Pt with no known medical history admitted with R MCA CVA.  RD unable to reach pt via phone. Discussed pt with RN.   Per MD, pt is to be discharged to CIR.    PO Intake: 0-100% x 6 recorded meals (54% average intake)  Medications reviewed and include: Ensure Enlive BID, folvite, MVI, thiamine  Labs reviewed.    NUTRITION - FOCUSED PHYSICAL EXAM:  RD unable to perform at this time.    Diet Order:   Diet Order            DIET DYS 2 Room service appropriate? No; Fluid consistency: Nectar Thick  Diet effective now              EDUCATION NEEDS:   No education needs have been identified at this time  Skin:  Skin Assessment: Reviewed RN Assessment  Last BM:  3/10  Height:   Ht Readings from Last 1 Encounters:  01/07/20 6\' 2"  (1.88 m)    Weight:   Wt Readings from Last 1 Encounters:  01/07/20 99.8 kg    BMI:  Body mass index is 28.25 kg/m.  Estimated Nutritional Needs:   Kcal:  2400-2600  Protein:  120-130 grams  Fluid:  >2L/d   03/08/20, MS, RD, LDN RD pager number and weekend/on-call pager number located in Amion.

## 2020-01-14 NOTE — Progress Notes (Signed)
PROGRESS NOTE    Derek [name]  ONG:[id number] DOB: [birth year] DOA: 01/07/2020 PCP: [name], MD   Brief Narrative:  HPI per Dr. [name] on 01/07/20 Derek [name] is a 44 y.o. male with no known medical history presenting as Code Stroke.  The patient is mostly sleeping and awakes and appears appropriate but his conversation doesn't exactly make sense.  He complains of being cold - when I asked him to straighten his right leg out (it was behind his left leg), he says he was "just putting it behind this thing to keep it warm" and seems unaware completely of his left side.  He reports that he needs to call his work so he doesn't get fired, but then didn't actually want to use the phone. He reports headache.  I called and spoke with his sister.  She was notified this AM that he was taken here.  She reports that he went out Saturday night to a family member's birthday party; they took him home and he was very intoxicated.  He was placed on the bed after 0200.  His girlfriend noted him on the floor sometime later.  She found him there again sometime later and had someone help him into the bed.  He was not moving on one side.  He apparently fell back on the floor at some point.  This AM, she finally called 911.  He has chronic back pain from a remote accident.  He has been under a lot of stress - including with his live-in girlfriend.  He also has a 25yo son.   ED Course:  EMS called for a fall, noted left-sided weakness, facial droop, slurred speech, rightward gaze.  CT consistent with R MCA CVA.  Neuro has seen, MRI recommended.  Temp 100.5, given empiric abx, no symptoms or apparent source.  **Interim History Currently being worked up for stroke and currently there is no definitive cause so patient undergoing a TEE which was done this morning and showed no LA/LAA thrombus and no PFO by color bubble study in the normal left ventricular EF of 60 to 65% with no significant valvular heart  disease.  He did have a hemorrhagic conversion and neurology does recommend no dual antiplatelet therapy because of the hemorrhagic conversion.  Currently on aspirin monotherapy for now.  PT/OT recommending CIR at discharge and currently after discussion with the case manager and the social worker patient cannot be discharged until tomorrow as insurance has been obtained and are seeing about Authorization now. Currently does not have safe disposition otherwise she will need to remain in the hospital for now.  Hospitalization has been complicated by back pain, mild hypotension, as well as an unwitnessed fall and a mild fever and leukocytosis.  Assessment & Plan:   Principal Problem:   Acute ischemic right MCA stroke (HCC) Active Problems:   Tobacco dependence   Marijuana dependence ([place])   Alcohol dependence ([place])   Cerebrovascular accident (CVA) due to thrombosis of right middle cerebral artery (HCC)   Chronic pain syndrome   Hypokalemia   Dysphagia, post-stroke   Leukocytosis  Acute CVA with Cerebral Edema with Hemorrhagic conversion of the CVA -Patient was last seen normal at approximately 2 AM on Sunday morning, and has had recurrent falls; patient fell again yesterday -With significant left-sided deficits and cognitive impairment, as well as lethargy on examination which is improved since yesterday -CT head: Recent M1 segment, right middle cerebral artery infarct with mass-effect and possible petechial hemorrhage  in portions of the right basal ganglia.  Proximal extent of postero superior right frontal and adjacent right parietal lobes. -CTA neck: neck no large vessel occlusion or significant stenosis -MRI head showed acute right MCA territory going basal ganglia and right parietal lobe.  Hemorrhagic transformation of the right basal ganglia infarct with mass-effect on the lateral ventricle -Echocardiogram showed EF of 46-96%, LV diastolic parameters normal. -Lower extremity Doppler  showed no evidence of DVT -Carotid doppler: B/L 1-39% ICA stenosis.  Bilateral vertebral arteries demonstrate antegrade flow -LDL 138, hemoglobin A1c 5.6 -Full lipid panel showed a total cholesterol/HDL ratio of 4.3, cholesterol level 204, HDL 47, LDL 138, triglycerides of 94, VLDL 19 -Continue with Atorvastatin 40 mg p.o. daily -Neurology had considered 3% saline but not needed at this time and repeat CT 3/11 showed "Evolving cerebral infarction with hemorrhagic conversion and mild leftward midline shift. No new hemorrhage, hydrocephalus, or worsening mass effect." -Repeat head CT scan 3/10 showed "Evolving right cerebral infarct with evidence for hemorrhagic transformation at the right basal ganglia, similar in appearance and distribution as compared to previous MRI. Associated mass effect with 3 mm of right-to-left shift. No hydrocephalus or ventricular trapping. 2. No other new acute intracranial abnormality" -Neurology consulted and appreciated' -Dr. Erlinda Hong recommends TEE which was done today and showed no LAA/LAA thrombus and no PFO by color bubble study.  The left ventricular EF was normal with the ejection fraction of 60 to 65% and there is no significant valvular heart disease noted -Hypercoagulable work-up ordered and his PTT lupus anticoagulant, DRVVT was negative and his ANA antibody was negative -Continue Aspirin 325 mg daily for now.  Holding DAPT given his hemorrhagic transformation. -Neurology recommending now blood pressure less than 160 given his hemorrhagic conversion the long-term blood pressure goal normotensive -PT and OT recommending CIR and CIR was consulted for further evaluation recommendations -SLP evaluated and recommended dysphagia 2 diet with nectar thick liquids and are going to defer recommending instrumental swallow assessment until after the TEE; SLP evaluated and the patient now on a dysphagia 3 mechanical soft diet with nectar thick liquids -Continue monitor and  follow-up on SLP recommendations -CIR Physician Dr. Posey Pronto was consulted and feels that the patient would be a good candidate for CIR -Patient is to be discharged to CIR however unfortunately cannot go per my discussion with the transition of care team given the patient's insurance (Received it and will need Authorization now) and that he has no other safe discharge disposition at this time he will need to remain in the hospital until that timeframe. -Neurology has signed off the case now and recommends a 30-day CardioNet monitor as an outpatient to rule out if atrial fibrillation -Dr. Erlinda Hong recommends follow-up with the stroke clinic at [facility] 4 weeks after discharge from rehab  Essential Hypertension -> Hypotension -Placed on Amlodipine 10 mg po Daily but will hold due to Hypotension and have discontinued it    -Currently  Was on metoprolol tartrate 5 mg IV every 12 but will stop -BP goal less than 160 given hemorrhagic conversion -He was ordered labetalol 20 mg IV once but was not given due to parameters not being met -BP is on the lower side still and is 115/72  Alcohol Dependence -Continue CIWA protocol with IV lorazepam -No signs of withdrawal -Continue Folic acid, Multivitamin + Minerals, and Thiamine supplementation  Marijuana Dependence -UDS positive for THC -Continue with Cessation efforts   Tobacco Dependence -Continue Nicotine 7 mg TD Patch -Smoking  Cessation Counseling given    SIRS with a Recurrent Fever and now leukocytosis -Patient with leukocytosis of 14.3 on admission and temperature of 100.5 F but had imporved. WBC was trending up and went from 9.0 -> 9.8 -> 10.7 -> 8.4 -> 7.0; patient has had a T-max of 100.6 recently but has been afebrile for the last 24-48 hours with a T-Max of 98.9 -UA unremarkable for infection, urine culture shows no growth -Blood cultures pending showed no growth to date at 5 days -Suspect secondary to the above but  if continues to spike temperatures again we will panculture again as his WBC is worsening  -Continue to monitor and trend temperature curve -Currently patient is denying any shortness of breath, burning or discomfort in his urine or any other complaints and has no diarrhea -Repeat CBC in AM   Hypokalemia -Improved as patient's potassium is now 4.3 yesterday and not rechecked today  -Continue to Monitor and Replete as Necessary -Repeat CMP intermittently  HLD -Full lipid panel showed a total cholesterol/HDL ratio of 4.3, cholesterol level 204, HDL 47, LDL 138, triglycerides of 94, VLDL 19 -His ALT was mildly elevated at 43 yesterday and today it was 46 and will need to continue to monitor -Continue with Atorvastatin 40 mg p.o. daily  Elevated ALT -Mild and went to 46 but is now improved and is 65 yesterday  -Continue monitor and trend hepatic function panel intermittently  -If worsening or not improving will need to obtain a right upper quadrant ultrasound and acute hepatitis panel  Headache, improving -Secondary to above -Not amenable acetaminophen so we will try oxycodone as we will try and avoid NSAIDs due to hemorrhagic conversion  Unwitnessed Fall -Patient fell getting out of chair and hit his Head per Nursing last week -Ordered Stat Head CT w/o Contrast as he has had a CVA with Hemorrhagic Conversion; Repeat CT showed "Evolving cerebral infarction with hemorrhagic conversion and mild leftward midline shift. No new hemorrhage, hydrocephalus, or worsening mass effect." -Notified Neurology and had no additional Recommendations -C/w Fall Precautions   Fever, improved now -Mild Temperature of 100.6 on 01/11/20 and had a temperature of 100.5 on admission -Patient is Afebrile now -Continue to Monitor for S/Sx of Infection -If Spikes a Fever again will  -Blood Cx from 01/07/20 showed NGTD at 5 Days -Repeat blood cultures if spikes another temperature; T-max over last 24 hours was  99.3 -We will obtain a chest x-ray in the a.m. -Currently mild leukocytosis has improved as WBC went from 10.7 -> 8.4 -> 7.0 -Continue monitor and trend temperature curve; Will not repeat CBC in AM as patient's Leukocytosis has resolved   Back Pain -Patient was unable to be comfortable as he been laying in the bed -We will try lidocaine patch -If continues to worsen will obtain a lumbar x-ray  Metabolic Acidosis -Was mild and is improved now -Patient's CO2 yesterday was now 23, anion gap was 11 and chloride level was 104 -Continue to monitor and trend and repeat CMP intermittently   DVT prophylaxis: SCDs given his hemorrhagic Transformation but now he is back on enoxaparin 40 mg subcu every 24 after Neurology is restarted this. Code Status: FULL CODE  Family Communication: No family present at bedside Disposition Plan: Patient is from home and had a CVA which then had hemorrhagic conversion and requires CIR at discharge.  Patient was fully worked up from a neurological standpoint and had his TEE done however is now spiking mild temperatures intermittently and has  a slight leukocytosis.  Will need to have insurance approval and authorizationbefore discharge to CIR and this will not happen until Monday, 01/14/2020.  My discussion with the transition care team the patient has received insurance but still needs authorization for CIR stay.  Consultants:   Neurology  Cardiology for TEE    Procedures:  ECHOCARDIOGRAM IMPRESSIONS    1. Left ventricular ejection fraction, by estimation, is 60 to 65%. The  left ventricle has normal function. The left ventricle has no regional  wall motion abnormalities. Left ventricular diastolic parameters were  normal.  2. Right ventricular systolic function is normal. The right ventricular  size is normal.  3. The mitral valve is normal in structure. No evidence of mitral valve  regurgitation. No evidence of mitral stenosis.  4. The aortic valve  is normal in structure. Aortic valve regurgitation is  not visualized. No aortic stenosis is present.  5. The inferior vena cava is normal in size with greater than 50%  respiratory variability, suggesting right atrial pressure of 3 mmHg.   FINDINGS  Left Ventricle: Left ventricular ejection fraction, by estimation, is 60  to 65%. The left ventricle has normal function. The left ventricle has no  regional wall motion abnormalities. The left ventricular internal cavity  size was normal in size. There is  no left ventricular hypertrophy. Left ventricular diastolic parameters  were normal. Normal left ventricular filling pressure.   Right Ventricle: The right ventricular size is normal. No increase in  right ventricular wall thickness. Right ventricular systolic function is  normal.   Left Atrium: Left atrial size was normal in size.   Right Atrium: Right atrial size was normal in size.   Pericardium: There is no evidence of pericardial effusion.   Mitral Valve: The mitral valve is normal in structure. Normal mobility of  the mitral valve leaflets. No evidence of mitral valve regurgitation. No  evidence of mitral valve stenosis.   Tricuspid Valve: The tricuspid valve is normal in structure. Tricuspid  valve regurgitation is not demonstrated. No evidence of tricuspid  stenosis.   Aortic Valve: The aortic valve is normal in structure. Aortic valve  regurgitation is not visualized. No aortic stenosis is present.   Pulmonic Valve: The pulmonic valve was normal in structure. Pulmonic valve  regurgitation is not visualized. No evidence of pulmonic stenosis.   Aorta: The aortic root is normal in size and structure.   Venous: The inferior vena cava is normal in size with greater than 50%  respiratory variability, suggesting right atrial pressure of 3 mmHg.   IAS/Shunts: No atrial level shunt detected by color flow Doppler.     LEFT VENTRICLE  PLAX 2D  LVIDd:     5.40 cm  Diastology  LVIDs:     3.20 cm LV e' lateral:  12.30 cm/s  LV PW:     1.00 cm LV E/e' lateral: 4.9  LV IVS:    0.90 cm LV e' medial:  10.20 cm/s  LVOT diam:   2.20 cm LV E/e' medial: 5.9  LV SV:     61  LV SV Index:  27  LVOT Area:   3.80 cm     RIGHT VENTRICLE  RV S prime:   12.50 cm/s  TAPSE (M-mode): 2.2 cm   LEFT ATRIUM       Index    RIGHT ATRIUM      Index  LA diam:    3.40 cm 1.50 cm/m RA Area:   14.80 cm  LA Vol (A2C):  46.5 ml 20.55 ml/m RA Volume:  33.20 ml 14.67 ml/m  LA Vol (A4C):  20.4 ml 9.01 ml/m  LA Biplane Vol: 30.8 ml 13.61 ml/m  AORTIC VALVE  LVOT Vmax:  86.80 cm/s  LVOT Vmean: 51.000 cm/s  LVOT VTI:  0.161 m    AORTA  Ao Root diam: 3.10 cm   MITRAL VALVE  MV Area (PHT): 3.31 cm  SHUNTS  MV Decel Time: 229 msec  Systemic VTI: 0.16 m  MV E velocity: 60.40 cm/s Systemic Diam: 2.20 cm  MV A velocity: 52.70 cm/s  MV E/A ratio: 1.15   LOWER EXTREMITY DUPLEX RIGHT:  - There is no evidence of deep vein thrombosis in the lower extremity.    - No cystic structure found in the popliteal fossa.    LEFT:  - There is no evidence of deep vein thrombosis in the lower extremity.    - No cystic structure found in the popliteal fossa.   TEE 01/10/20 1. No LAA/LA thrombus.  2. No PFO by color or bubble study.  3. LVEF normal 60-65%. 4. No significant valvular heart disease.   IMPRESSIONS    1. Left ventricular ejection fraction, by estimation, is 60 to 65%. The  left ventricle has normal function. The left ventricle has no regional  wall motion abnormalities.  2. Right ventricular systolic function is normal. The right ventricular  size is normal.  3. No left atrial/left atrial appendage thrombus was detected. The LAA  emptying velocity was 87 cm/s.  4. Redundant chordal tissue present. The mitral valve is normal in  structure. Trivial mitral valve regurgitation. No  evidence of mitral  stenosis.  5. The aortic valve is tricuspid. Aortic valve regurgitation is not  visualized. No aortic stenosis is present.  6. Agitated saline contrast bubble study was negative, with no evidence  of any interatrial shunt.   Conclusion(s)/Recommendation(s): Normal biventricular function without  evidence of hemodynamically significant valvular heart disease. No LA/LAA  thrombus identified. Negative bubble study for interatrial shunt. No  intracardiac source of embolism detected  on this on this transesophageal echocardiogram.   FINDINGS  Left Ventricle: Left ventricular ejection fraction, by estimation, is 60  to 65%. The left ventricle has normal function. The left ventricle has no  regional wall motion abnormalities. The left ventricular internal cavity  size was normal in size. There is  no left ventricular hypertrophy.   Right Ventricle: The right ventricular size is normal. No increase in  right ventricular wall thickness. Right ventricular systolic function is  normal.   Left Atrium: Left atrial size was normal in size. No left atrial/left  atrial appendage thrombus was detected. The LAA emptying velocity was 87  cm/s.   Right Atrium: Right atrial size was normal in size.   Pericardium: Trivial pericardial effusion is present. The pericardial  effusion is circumferential.   Mitral Valve: Redundant chordal tissue present. The mitral valve is normal  in structure. Trivial mitral valve regurgitation. No evidence of mitral  valve stenosis.   Tricuspid Valve: The tricuspid valve is normal in structure. Tricuspid  valve regurgitation is trivial. No evidence of tricuspid stenosis.   Aortic Valve: The aortic valve is tricuspid. Aortic valve regurgitation is  not visualized. No aortic stenosis is present.   Pulmonic Valve: The pulmonic valve was normal in structure. Pulmonic valve  regurgitation is trivial. No evidence of pulmonic stenosis.   Aorta:  The aortic root, ascending aorta and aortic arch are all  structurally normal, with no evidence of dilitation or obstruction.   Venous: The right upper pulmonary vein and left upper pulmonary vein are  normal.   IAS/Shunts: No atrial level shunt detected by color flow Doppler. Agitated  saline contrast was given intravenously to evaluate for intracardiac  shunting. Agitated saline contrast bubble study was negative, with no  evidence of any interatrial shunt.   EKG: Rhythm strip during this exam demostrated normal sinus rhythm.       AORTA  Ao Root diam: 3.60 cm  Ao Asc diam: 3.00 cm   TRICUSPID VALVE  TR Peak grad:  9.0 mmHg  TR Vmax:    150.00 cm/s      Antimicrobials: Anti-infectives (From admission, onward)   Start     Dose/Rate Route Frequency Ordered Stop   01/07/20 2200  vancomycin (VANCOREADY) IVPB 1500 mg/300 mL  Status:  Discontinued     1,500 mg 150 mL/hr over 120 Minutes Intravenous Every 12 hours 01/07/20 0834 01/07/20 1332   01/07/20 1400  ceFEPIme (MAXIPIME) 2 g in sodium chloride 0.9 % 100 mL IVPB  Status:  Discontinued     2 g 200 mL/hr over 30 Minutes Intravenous Every 8 hours 01/07/20 0834 01/07/20 1332   01/07/20 0830  ceFEPIme (MAXIPIME) 2 g in sodium chloride 0.9 % 100 mL IVPB     2 g 200 mL/hr over 30 Minutes Intravenous  Once 01/07/20 0826 01/07/20 0932   01/07/20 0830  metroNIDAZOLE (FLAGYL) IVPB 500 mg     500 mg 100 mL/hr over 60 Minutes Intravenous  Once 01/07/20 0826 01/07/20 1041   01/07/20 0830  vancomycin (VANCOCIN) IVPB 1000 mg/200 mL premix  Status:  Discontinued     1,000 mg 200 mL/hr over 60 Minutes Intravenous  Once 01/07/20 0826 01/07/20 0828   01/07/20 0830  vancomycin (VANCOREADY) IVPB 2000 mg/400 mL     2,000 mg 200 mL/hr over 120 Minutes Intravenous  Once 01/07/20 0828 01/07/20 1135     Subjective: Seen and examined at bedside and again he appeared uncomfortable complaining of lower back pain but denies any nausea or  vomiting.  States that his head is doing okay.  Asking about his insurance status.  No other concerns or complaints at this time.  Objective: Vitals:   01/14/20 0316 01/14/20 0900 01/14/20 1301 01/14/20 1606  BP: 111/68 121/70 115/72   Pulse: 72 82 78 88  Resp: 18 18 18    Temp: 98 F (36.7 C) 98.9 F (37.2 C) 98.9 F (37.2 C)   TempSrc: Oral Oral Oral   SpO2: 98% 97% 100%   Weight:      Height:       No intake or output data in the 24 hours ending 01/14/20 1613 Filed Weights   01/07/20 0659  Weight: 99.8 kg   Examination: Physical Exam:  Constitutional: WN/WD African-American male currently who is again uncomfortable complaining of some lower back pain  Eyes: Lids and conjunctivae normal, sclerae anicteric  ENMT: External Ears, Nose appear normal. Grossly normal hearing.  Still has a left facial droop Neck: Appears normal, supple, no cervical masses, normal ROM, no appreciable thyromegaly; no JVD Respiratory: Diminished to auscultation bilaterally, no wheezing, rales, rhonchi or crackles. Normal respiratory effort and patient is not tachypenic. No accessory muscle use.  Unlabored breathing Cardiovascular: RRR, no murmurs / rubs / gallops. S1 and S2 auscultated.  Abdomen: Soft, non-tender, distended secondary body habitus. Bowel sounds positive.  GU: Deferred. Musculoskeletal: No clubbing / cyanosis of digits/nails.  No joint deformity upper and lower extremities.  Skin: No rashes, lesions, ulcers on limited skin evaluation. No induration; Warm and dry.  Neurologic: CN 2-12 grossly intact with no focal deficits but does have some hemiplegia and hemiparesis on the left.  Romberg sign and cerebellar reflexes not assessed.  Psychiatric: Normal judgment and insight. Alert and oriented x 3.  Again seems somewhat agitated but had an appropriate affect.   Data Reviewed: I have personally reviewed following labs and imaging studies  CBC: Recent Labs  Lab 01/10/20 0517 01/11/20 0415  01/12/20 0404 01/13/20 0416 01/14/20 0415  WBC 9.0 9.8 10.7* 8.4 7.0  NEUTROABS 5.9 6.9 7.3 5.3 4.1  HGB 13.8 14.0 14.7 15.0 14.6  HCT 41.0 42.8 44.1 44.7 43.3  MCV 87.8 89.2 89.3 87.8 87.8  PLT 233 267 265 280 762   Basic Metabolic Panel: Recent Labs  Lab 01/07/20 1644 01/09/20 0429 01/10/20 0517 01/11/20 0415 01/12/20 0404 01/13/20 0416  NA  --  140 138 138 138 138  K  --  3.4* 3.9 4.1 4.1 4.3  CL  --  107 108 103 106 104  CO2  --  24 22 21* 21* 23  GLUCOSE  --  98 101* 84 94 96  BUN  --  9 10 11 13 14   CREATININE  --  0.88 0.92 0.89 0.90 0.97  CALCIUM  --  8.9 8.8* 9.2 9.0 9.3  MG 2.1  --  2.1 2.2 2.2 2.2  PHOS 3.3  --  3.6 4.1 4.7* 4.1   GFR: Estimated Creatinine Clearance: 122.6 mL/min (by C-G formula based on SCr of 0.97 mg/dL). Liver Function Tests: Recent Labs  Lab 01/10/20 0517 01/11/20 0415 01/12/20 0404 01/13/20 0416  AST 43* 46* 40 39  ALT 33 33 39 36  ALKPHOS 64 67 71 71  BILITOT 0.5 0.8 0.6 0.8  PROT 6.4* 6.8 7.0 7.1  ALBUMIN 3.2* 3.4* 3.5 3.4*   No results for input(s): LIPASE, AMYLASE in the last 168 hours. No results for input(s): AMMONIA in the last 168 hours. Coagulation Profile: No results for input(s): INR, PROTIME in the last 168 hours. Cardiac Enzymes: No results for input(s): CKTOTAL, CKMB, CKMBINDEX, TROPONINI in the last 168 hours. BNP (last 3 results) No results for input(s): PROBNP in the last 8760 hours. HbA1C: No results for input(s): HGBA1C in the last 72 hours. CBG: No results for input(s): GLUCAP in the last 168 hours. Lipid Profile: No results for input(s): CHOL, HDL, LDLCALC, TRIG, CHOLHDL, LDLDIRECT in the last 72 hours. Thyroid Function Tests: No results for input(s): TSH, T4TOTAL, FREET4, T3FREE, THYROIDAB in the last 72 hours. Anemia Panel: No results for input(s): VITAMINB12, FOLATE, FERRITIN, TIBC, IRON, RETICCTPCT in the last 72 hours. Sepsis Labs: No results for input(s): PROCALCITON, LATICACIDVEN in the last  168 hours.  Recent Results (from the past 240 hour(s))  Blood culture (routine x 2)     Status: None   Collection Time: 01/07/20  8:07 AM   Specimen: BLOOD RIGHT ARM  Result Value Ref Range Status   Specimen Description BLOOD RIGHT ARM  Final   Special Requests   Final    BOTTLES DRAWN AEROBIC AND ANAEROBIC Blood Culture adequate volume   Culture   Final    NO GROWTH 5 DAYS Performed at [facility], [address] [place], [place] [address]    Report Status 01/12/2020 FINAL  Final  SARS CORONAVIRUS 2 (TAT 6-24 HRS) Nasopharyngeal Nasopharyngeal Swab  Status: None   Collection Time: 01/07/20  9:34 AM   Specimen: Nasopharyngeal Swab  Result Value Ref Range Status   SARS Coronavirus 2 NEGATIVE NEGATIVE Final    Comment: (NOTE) SARS-CoV-2 target nucleic acids are NOT DETECTED. The SARS-CoV-2 RNA is generally detectable in upper and lower respiratory specimens during the acute phase of infection. Negative results do not preclude SARS-CoV-2 infection, do not rule out co-infections with other pathogens, and should not be used as the sole basis for treatment or other patient management decisions. Negative results must be combined with clinical observations, patient history, and epidemiological information. The expected result is Negative. Fact Sheet for Patients: SugarRoll[employer]be Fact Sheet for Healthcare Providers: https://www.woods-mathews.com/ This test is not yet approved or cleared by the [place] FDA and  has been authorized for detection and/or diagnosis of SARS-CoV-2 by FDA under an Emergency Use Authorization (EUA). This EUA will remain  in effect (meaning this test can be used) for the duration of the COVID-19 declaration under Section 56 4(b)(1) of the Act, 21 U.S.C. section 360bbb-3(b)(1), unless the authorization is terminated or revoked sooner. Performed at [facility], [place] [address] [place],  [place] [address]   Urine culture     Status: None   Collection Time: 01/07/20 10:47 AM   Specimen: In/Out Cath Urine  Result Value Ref Range Status   Specimen Description IN/OUT CATH URINE  Final   Special Requests NONE  Final   Culture   Final    NO GROWTH Performed at [facility], [place] [address] [place], [place] [address]    Report Status 01/08/2020 FINAL  Final     RN Pressure Injury Documentation:     Estimated body mass index is 28.25 kg/m as calculated from the following:   Height as of this encounter: 6' 2"  (1.88 m).   Weight as of this encounter: 99.8 kg.  Malnutrition Type:  Nutrition Problem: Inadequate oral intake Etiology: poor appetite   Malnutrition Characteristics:  Signs/Symptoms: meal completion < 50%   Nutrition Interventions:  Interventions: Ensure Enlive (each supplement provides 350kcal and 20 grams of protein), MVI   Radiology Studies: DG Swallowing Func-Speech Pathology  Result Date: 01/14/2020 Objective Swallowing Evaluation: Type of Study: MBS-Modified Barium Swallow Study  Patient Details Name: THEADORE BLUNCK MRN: [id number] Date of Birth: [birth year] Today's Date: 01/14/2020 Time: SLP Start Time (ACUTE ONLY): 1225 -SLP Stop Time (ACUTE ONLY): 1236 SLP Time Calculation (min) (ACUTE ONLY): 11 min Past Medical History: Past Medical History: Diagnosis Date . Acute ischemic right MCA stroke ([place]) 01/07/2020 . Alcohol dependence ([place])  . Back pain  . Marijuana dependence ([place])  . Tobacco dependence  Past Surgical History: Past Surgical History: Procedure Laterality Date . BUBBLE STUDY  01/10/2020  Procedure: BUBBLE STUDY;  Surgeon: Geralynn Rile, MD;  Location: [facility];  Service: Cardiovascular;; . TEE WITHOUT CARDIOVERSION N/A 01/10/2020  Procedure: TRANSESOPHAGEAL ECHOCARDIOGRAM (TEE);  Surgeon: Geralynn Rile, MD;  Location: [place] [facility];  Service: Cardiovascular;  Laterality: N/A; HPI:  TAYSHON WINKER is a 44 y.o. male with no known  medical history presenting as Code Stroke.  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 There is hemorrhagic transformation of the right basal ganglia  Subjective: Pt was lethargic Assessment / Plan / Recommendation CHL IP CLINICAL IMPRESSIONS 01/14/2020 Clinical Impression Pt demonstrates a mild dysphagia with instances of left anterior bolus loss and fairly consistent delay in swallow initiation to pyriform sinuses. Despite  these deficits, pt was able to orally manage thin liquids with a straw and masticate solids without oral residue. Pt only had instances of trace flash penetration, and one very trace frank penetration event with sensation, even when challenged with large consecutive sips.  Pt is able to upgrade diet to regular solids and thin liquids. Will f/u for tolerance and compensatory strategies as needed.  SLP Visit Diagnosis Dysphagia, oropharyngeal phase (R13.12) Attention and concentration deficit following -- Frontal lobe and executive function deficit following -- Impact on safety and function Mild aspiration risk   CHL IP TREATMENT RECOMMENDATION 01/14/2020 Treatment Recommendations Therapy as outlined in treatment plan below   Prognosis 01/14/2020 Prognosis for Safe Diet Advancement Good Barriers to Reach Goals -- Barriers/Prognosis Comment -- CHL IP DIET RECOMMENDATION 01/14/2020 SLP Diet Recommendations Regular solids;Thin liquid Liquid Administration via Cup;Straw Medication Administration Whole meds with puree Compensations Slow rate;Small sips/bites;Lingual sweep for clearance of pocketing Postural Changes Seated upright at 90 degrees   CHL IP OTHER RECOMMENDATIONS 01/14/2020 Recommended Consults -- Oral Care Recommendations Oral care BID Other Recommendations --   CHL IP FOLLOW UP RECOMMENDATIONS 01/14/2020 Follow up Recommendations Inpatient Rehab   CHL IP FREQUENCY AND DURATION 01/14/2020 Speech Therapy Frequency (ACUTE ONLY) min 2x/week Treatment  Duration 2 weeks      CHL IP ORAL PHASE 01/14/2020 Oral Phase Impaired Oral - Pudding Teaspoon -- Oral - Pudding Cup -- Oral - Honey Teaspoon -- Oral - Honey Cup -- Oral - Nectar Teaspoon -- Oral - Nectar Cup -- Oral - Nectar Straw WFL Oral - Thin Teaspoon -- Oral - Thin Cup Left anterior bolus loss Oral - Thin Straw WFL Oral - Puree WFL Oral - Mech Soft -- Oral - Regular WFL Oral - Multi-Consistency -- Oral - Pill Decreased bolus cohesion Oral Phase - Comment --  CHL IP PHARYNGEAL PHASE 01/14/2020 Pharyngeal Phase Impaired Pharyngeal- Pudding Teaspoon -- Pharyngeal -- Pharyngeal- Pudding Cup -- Pharyngeal -- Pharyngeal- Honey Teaspoon -- Pharyngeal -- Pharyngeal- Honey Cup -- Pharyngeal -- Pharyngeal- Nectar Teaspoon -- Pharyngeal -- Pharyngeal- Nectar Cup -- Pharyngeal -- Pharyngeal- Nectar Straw Delayed swallow initiation-vallecula Pharyngeal -- Pharyngeal- Thin Teaspoon -- Pharyngeal -- Pharyngeal- Thin Cup Delayed swallow initiation-pyriform sinuses Pharyngeal -- Pharyngeal- Thin Straw Delayed swallow initiation-pyriform sinuses Pharyngeal -- Pharyngeal- Puree Delayed swallow initiation-vallecula Pharyngeal -- Pharyngeal- Mechanical Soft -- Pharyngeal -- Pharyngeal- Regular Delayed swallow initiation-vallecula Pharyngeal -- Pharyngeal- Multi-consistency -- Pharyngeal -- Pharyngeal- Pill -- Pharyngeal -- Pharyngeal Comment --  No flowsheet data found. DeBlois, Katherene Ponto 01/14/2020, 1:27 PM              Scheduled Meds: . aspirin  300 mg Rectal Daily   Or  . aspirin  325 mg Oral Daily  . atorvastatin  40 mg Oral q1800  . enoxaparin (LOVENOX) injection  40 mg Subcutaneous Q24H  . folic acid  1 mg Oral Daily  . labetalol  20 mg Intravenous Once  . lidocaine  1 patch Transdermal Q24H  . multivitamin with minerals  1 tablet Oral Daily  . nicotine  7 mg Transdermal Daily  . thiamine  100 mg Oral Daily   Or  . thiamine  100 mg Intravenous Daily   Continuous Infusions: . sodium chloride 50 mL/hr at  01/09/20 1343    LOS: 7 days   Kerney Elbe, DO [facility] PAGER is on [place]  If 7PM-7AM, please contact night-coverage [email or web address]

## 2020-01-15 ENCOUNTER — Encounter (HOSPITAL_COMMUNITY): Payer: Self-pay | Admitting: Physical Medicine & Rehabilitation

## 2020-01-15 ENCOUNTER — Inpatient Hospital Stay (HOSPITAL_COMMUNITY)
Admission: RE | Admit: 2020-01-15 | Discharge: 2020-02-13 | DRG: 057 | Disposition: A | Discharge status: Home / Self Care | Payer: BC Managed Care – PPO | Source: Intra-hospital | Attending: Physical Medicine & Rehabilitation | Admitting: Physical Medicine & Rehabilitation

## 2020-01-15 ENCOUNTER — Other Ambulatory Visit: Payer: Self-pay

## 2020-01-15 DIAGNOSIS — Z791 Long term (current) use of non-steroidal anti-inflammatories (NSAID): Secondary | ICD-10-CM

## 2020-01-15 DIAGNOSIS — F1220 Cannabis dependence, uncomplicated: Secondary | ICD-10-CM | POA: Diagnosis not present

## 2020-01-15 DIAGNOSIS — I69154 Hemiplegia and hemiparesis following nontraumatic intracerebral hemorrhage affecting left non-dominant side: Secondary | ICD-10-CM | POA: Diagnosis not present

## 2020-01-15 DIAGNOSIS — G8110 Spastic hemiplegia affecting unspecified side: Secondary | ICD-10-CM

## 2020-01-15 DIAGNOSIS — R252 Cramp and spasm: Secondary | ICD-10-CM | POA: Diagnosis not present

## 2020-01-15 DIAGNOSIS — R52 Pain, unspecified: Secondary | ICD-10-CM

## 2020-01-15 DIAGNOSIS — Z79899 Other long term (current) drug therapy: Secondary | ICD-10-CM | POA: Diagnosis not present

## 2020-01-15 DIAGNOSIS — Z7141 Alcohol abuse counseling and surveillance of alcoholic: Secondary | ICD-10-CM

## 2020-01-15 DIAGNOSIS — R1312 Dysphagia, oropharyngeal phase: Secondary | ICD-10-CM | POA: Diagnosis not present

## 2020-01-15 DIAGNOSIS — K5900 Constipation, unspecified: Secondary | ICD-10-CM | POA: Diagnosis not present

## 2020-01-15 DIAGNOSIS — I69191 Dysphagia following nontraumatic intracerebral hemorrhage: Secondary | ICD-10-CM

## 2020-01-15 DIAGNOSIS — R202 Paresthesia of skin: Secondary | ICD-10-CM | POA: Diagnosis not present

## 2020-01-15 DIAGNOSIS — E785 Hyperlipidemia, unspecified: Secondary | ICD-10-CM | POA: Diagnosis not present

## 2020-01-15 DIAGNOSIS — M25512 Pain in left shoulder: Secondary | ICD-10-CM | POA: Diagnosis not present

## 2020-01-15 DIAGNOSIS — M549 Dorsalgia, unspecified: Secondary | ICD-10-CM | POA: Diagnosis not present

## 2020-01-15 DIAGNOSIS — Z716 Tobacco abuse counseling: Secondary | ICD-10-CM | POA: Diagnosis not present

## 2020-01-15 DIAGNOSIS — G8194 Hemiplegia, unspecified affecting left nondominant side: Secondary | ICD-10-CM | POA: Diagnosis not present

## 2020-01-15 DIAGNOSIS — Z7151 Drug abuse counseling and surveillance of drug abuser: Secondary | ICD-10-CM

## 2020-01-15 DIAGNOSIS — G8929 Other chronic pain: Secondary | ICD-10-CM | POA: Diagnosis present

## 2020-01-15 DIAGNOSIS — Z8679 Personal history of other diseases of the circulatory system: Secondary | ICD-10-CM | POA: Diagnosis not present

## 2020-01-15 DIAGNOSIS — I63511 Cerebral infarction due to unspecified occlusion or stenosis of right middle cerebral artery: Secondary | ICD-10-CM | POA: Diagnosis present

## 2020-01-15 DIAGNOSIS — I10 Essential (primary) hypertension: Secondary | ICD-10-CM | POA: Diagnosis not present

## 2020-01-15 DIAGNOSIS — Z7982 Long term (current) use of aspirin: Secondary | ICD-10-CM

## 2020-01-15 DIAGNOSIS — G894 Chronic pain syndrome: Secondary | ICD-10-CM | POA: Diagnosis not present

## 2020-01-15 DIAGNOSIS — E876 Hypokalemia: Secondary | ICD-10-CM | POA: Diagnosis not present

## 2020-01-15 DIAGNOSIS — I69391 Dysphagia following cerebral infarction: Secondary | ICD-10-CM | POA: Diagnosis not present

## 2020-01-15 DIAGNOSIS — F17210 Nicotine dependence, cigarettes, uncomplicated: Secondary | ICD-10-CM | POA: Diagnosis not present

## 2020-01-15 DIAGNOSIS — I69192 Facial weakness following nontraumatic intracerebral hemorrhage: Secondary | ICD-10-CM | POA: Diagnosis not present

## 2020-01-15 DIAGNOSIS — H5500 Unspecified nystagmus: Secondary | ICD-10-CM | POA: Diagnosis present

## 2020-01-15 DIAGNOSIS — M21332 Wrist drop, left wrist: Secondary | ICD-10-CM | POA: Diagnosis not present

## 2020-01-15 DIAGNOSIS — M21372 Foot drop, left foot: Secondary | ICD-10-CM | POA: Diagnosis not present

## 2020-01-15 DIAGNOSIS — D72829 Elevated white blood cell count, unspecified: Secondary | ICD-10-CM | POA: Diagnosis not present

## 2020-01-15 LAB — CBC
HCT: 43.3 % (ref 39.0–52.0)
Hemoglobin: 14.7 g/dL (ref 13.0–17.0)
MCH: 29.9 pg (ref 26.0–34.0)
MCHC: 33.9 g/dL (ref 30.0–36.0)
MCV: 88 fL (ref 80.0–100.0)
Platelets: 311 10*3/uL (ref 150–400)
RBC: 4.92 MIL/uL (ref 4.22–5.81)
RDW: 16.3 % — ABNORMAL HIGH (ref 11.5–15.5)
WBC: 6.6 10*3/uL (ref 4.0–10.5)
nRBC: 0 % (ref 0.0–0.2)

## 2020-01-15 LAB — CREATININE, SERUM
Creatinine, Ser: 1.05 mg/dL (ref 0.61–1.24)
GFR calc Af Amer: >60 mL/min (ref >60)
GFR calc non Af Amer: >60 mL/min (ref >60)

## 2020-01-15 MED ORDER — THIAMINE HCL 100 MG/ML IJ SOLN
100.0000 mg | Freq: Every day | INTRAMUSCULAR | Status: DC
  Filled 2020-01-15 (×27): qty 1

## 2020-01-15 MED ORDER — LIDOCAINE 5 % EX PTCH: 1.0000 | MEDICATED_PATCH | CUTANEOUS | 0 refills | Status: DC

## 2020-01-15 MED ORDER — ATORVASTATIN CALCIUM 40 MG PO TABS
40.0000 mg | ORAL_TABLET | Freq: Every day | ORAL | Status: DC
  Administered 2020-01-16 – 2020-02-12 (×28): 40 mg via ORAL
  Filled 2020-01-15 (×28): qty 1

## 2020-01-15 MED ORDER — SENNOSIDES-DOCUSATE SODIUM 8.6-50 MG PO TABS
1.0000 | ORAL_TABLET | Freq: Every evening | ORAL | Status: DC | PRN
  Administered 2020-01-20: 1 via ORAL
  Filled 2020-01-15: qty 1

## 2020-01-15 MED ORDER — ACETAMINOPHEN 325 MG PO TABS
650.0000 mg | ORAL_TABLET | ORAL | Status: DC | PRN
  Administered 2020-01-20 – 2020-02-02 (×9): 650 mg via ORAL
  Filled 2020-01-15 (×12): qty 2

## 2020-01-15 MED ORDER — THIAMINE HCL 100 MG PO TABS: 100.0000 mg | ORAL_TABLET | Freq: Every day | ORAL | 0 refills | Status: DC

## 2020-01-15 MED ORDER — ASPIRIN 325 MG PO TABS: 325.0000 mg | ORAL_TABLET | Freq: Every day | ORAL | 0 refills | Status: DC

## 2020-01-15 MED ORDER — FOLIC ACID 1 MG PO TABS: 1.0000 mg | ORAL_TABLET | Freq: Every day | ORAL | 0 refills | Status: DC

## 2020-01-15 MED ORDER — ASPIRIN 300 MG RE SUPP
300.0000 mg | Freq: Every day | RECTAL | Status: DC
  Filled 2020-01-15 (×2): qty 1

## 2020-01-15 MED ORDER — OXYCODONE HCL 5 MG PO TABS
5.0000 mg | ORAL_TABLET | Freq: Four times a day (QID) | ORAL | Status: DC | PRN
  Administered 2020-01-16 – 2020-02-11 (×8): 5 mg via ORAL
  Filled 2020-01-15 (×11): qty 1

## 2020-01-15 MED ORDER — RESOURCE THICKENUP CLEAR PO POWD: ORAL | Status: DC | PRN

## 2020-01-15 MED ORDER — ENOXAPARIN SODIUM 40 MG/0.4ML ~~LOC~~ SOLN
40.0000 mg | SUBCUTANEOUS | Status: DC
  Administered 2020-01-15 – 2020-02-12 (×29): 40 mg via SUBCUTANEOUS
  Filled 2020-01-15 (×29): qty 0.4

## 2020-01-15 MED ORDER — THIAMINE HCL 100 MG PO TABS
100.0000 mg | ORAL_TABLET | Freq: Every day | ORAL | Status: DC
  Administered 2020-01-16 – 2020-02-13 (×29): 100 mg via ORAL
  Filled 2020-01-15 (×29): qty 1

## 2020-01-15 MED ORDER — NICOTINE 7 MG/24HR TD PT24: 7.0000 mg | MEDICATED_PATCH | Freq: Every day | TRANSDERMAL | 0 refills | Status: DC

## 2020-01-15 MED ORDER — ASPIRIN 325 MG PO TABS
325.0000 mg | ORAL_TABLET | Freq: Every day | ORAL | Status: DC
  Administered 2020-01-16 – 2020-02-13 (×29): 325 mg via ORAL
  Filled 2020-01-15 (×29): qty 1

## 2020-01-15 MED ORDER — ADULT MULTIVITAMIN W/MINERALS CH
1.0000 | ORAL_TABLET | Freq: Every day | ORAL | Status: DC
  Administered 2020-01-16 – 2020-02-13 (×29): 1 via ORAL
  Filled 2020-01-15 (×30): qty 1

## 2020-01-15 MED ORDER — ACETAMINOPHEN 650 MG RE SUPP: 650.0000 mg | RECTAL | Status: DC | PRN

## 2020-01-15 MED ORDER — ATORVASTATIN CALCIUM 40 MG PO TABS: 40.0000 mg | ORAL_TABLET | Freq: Every day | ORAL | 0 refills | Status: DC

## 2020-01-15 MED ORDER — ACETAMINOPHEN 160 MG/5ML PO SOLN: 650.0000 mg | ORAL | Status: DC | PRN

## 2020-01-15 MED ORDER — FOLIC ACID 1 MG PO TABS
1.0000 mg | ORAL_TABLET | Freq: Every day | ORAL | Status: DC
  Administered 2020-01-16 – 2020-02-13 (×29): 1 mg via ORAL
  Filled 2020-01-15 (×29): qty 1

## 2020-01-15 MED ORDER — LIDOCAINE 5 % EX PTCH
1.0000 | MEDICATED_PATCH | CUTANEOUS | Status: DC
  Administered 2020-01-16 – 2020-02-13 (×28): 1 via TRANSDERMAL
  Filled 2020-01-15 (×26): qty 1

## 2020-01-15 MED ORDER — NICOTINE 7 MG/24HR TD PT24
7.0000 mg | MEDICATED_PATCH | Freq: Every day | TRANSDERMAL | Status: DC
  Filled 2020-01-15 (×5): qty 1

## 2020-01-15 MED ORDER — SORBITOL 70 % SOLN
30.0000 mL | Freq: Every day | Status: DC | PRN
  Administered 2020-02-02: 30 mL via ORAL
  Filled 2020-01-15 (×2): qty 30

## 2020-01-15 MED ORDER — ENOXAPARIN SODIUM 40 MG/0.4ML ~~LOC~~ SOLN: 40.0000 mg | SUBCUTANEOUS | Status: DC

## 2020-01-15 MED ORDER — SENNOSIDES-DOCUSATE SODIUM 8.6-50 MG PO TABS: 1.0000 | ORAL_TABLET | Freq: Every evening | ORAL | 0 refills | Status: DC | PRN

## 2020-01-15 MED ORDER — ADULT MULTIVITAMIN W/MINERALS CH: 1.0000 | ORAL_TABLET | Freq: Every day | ORAL | 0 refills | Status: AC

## 2020-01-15 NOTE — Discharge Summary (Signed)
Physician Discharge Summary  WALDEN STATZ MEQ:[id number] DOB: October 31, 1976 DOA: 01/07/2020  PCP: [name], MD  Admit date: 01/07/2020 Discharge date: 01/15/2020  Admitted From: Home Disposition: CIR  Recommendations for Outpatient Follow-up:  1. Follow up with PCP in 1-2 weeks 2. Have a 30-day event monitor in the outpatient setting after discharge from CIR 3. Follow up with Neurology within 4 weks 4. Please obtain CMP/CBC, Mag, Phos in one week 5. Please follow up on the following pending results:  Home Health: No  Equipment/Devices: Wheelchair; Wheelchair Cushion  Discharge Condition: Stable CODE STATUS: FULL CODE Diet recommendation: Regular Diet   Brief/Interim Summary: HPI per Dr. [name] on 01/07/20 Derek Door Guestis a 44 y.o.malewithno knownmedical historypresenting as Code Stroke.The patient is mostly sleeping and awakes and appears appropriate but his conversation doesn't exactly make sense. He complains of being cold - when I asked him to straighten his right leg out (it was behind his left leg), he says he was "just putting it behind this thing to keep it warm" and seems unaware completely of his left side. He reports that he needs to call his work so he doesn't get fired, but then didn't actually want to use the phone. He reports headache.  I called and spoke with his sister. She was notified this AM that he was taken here. She reports that he went out Saturday night to a family member's birthday party; they took him home and he was very intoxicated. He was placed on the bed after 0200. His girlfriend noted him on the floor sometime later. She found him there again sometime later and had someone help him into the bed. He was not moving on one side. He apparently fell back on the floor at some point. This AM, she finally called 911.  He has chronic back pain from a remote accident. He has been under a lot of stress - including with his live-in  girlfriend. He also has a 25yo son.   ED Course:EMS called for a fall, noted left-sided weakness, facial droop, slurred speech, rightward gaze. CT consistent with R MCA CVA. Neuro has seen, MRI recommended. Temp 100.5, given empiric abx, no symptoms or apparent source.  **Interim History Currently being worked up for stroke and currently there is no definitive cause so patient undergoing a TEE which was done this morning and showed no LA/LAA thrombus and no PFO by color bubble study in the normal left ventricular EF of 60 to 65% with no significant valvular heart disease.  He did have a hemorrhagic conversion and neurology does recommend no dual antiplatelet therapy because of the hemorrhagic conversion.  Currently on aspirin monotherapy for now.  PT/OT recommending CIR at discharge and he is stable and has a bed available now along with discharge authorization so will be discharged.  Hospitalization has been complicated by back pain, mild hypotension, as well as an unwitnessed fall and a mild fever and leukocytosis which are both improved. He will need follow-up with neurology in outpatient setting and further care per CIR.   Discharge Diagnoses:  Principal Problem:   Acute ischemic right MCA stroke (HCC) Active Problems:   Tobacco dependence   Marijuana dependence ([place])   Alcohol dependence ([place])   Cerebrovascular accident (CVA) due to thrombosis of right middle cerebral artery (HCC)   Chronic pain syndrome   Hypokalemia   Dysphagia, post-stroke   Leukocytosis  Acute CVAwith Cerebral Edema with Hemorrhagic conversion of the CVA -Patient was last seen normal at  approximately 2 AM on Sunday morning, and has had recurrent falls; patient fell again yesterday -With significant left-sided deficits and cognitive impairment, as well as lethargy on examination which is improved since yesterday -CT head:Recent M1 segment, right middle cerebral artery infarct with mass-effect and  possible petechial hemorrhage in portions of the right basal ganglia.Proximal extent of posterosuperior right frontal and adjacent right parietal lobes. -CTA neck:neck no large vessel occlusion or significant stenosis -MRI head showed acute right MCA territory going basal ganglia and right parietal lobe.Hemorrhagic transformation of the right basal ganglia infarct with mass-effect on the lateral ventricle -Echocardiogram showed EF of 95-62%, LV diastolic parameters normal. -Lower extremity Doppler showed no evidence of DVT -Carotid doppler: B/L 1-39% ICA stenosis.Bilateral vertebral arteries demonstrate antegrade flow -LDL138, hemoglobin A1c 5.6 -Full lipid panel showed a total cholesterol/HDL ratio of 4.3, cholesterol level 204, HDL 47, LDL 138, triglycerides of 94, VLDL 19 -Continue with Atorvastatin 40 mg p.o. daily -Neurology had considered 3% saline but not needed at this time and repeat CT 3/11 showed "Evolving cerebral infarction with hemorrhagic conversion and mild leftward midline shift. No new hemorrhage, hydrocephalus, or worsening mass effect." -Repeat head CT scan 3/10 showed "Evolving right cerebral infarct with evidence for hemorrhagic transformation at the right basal ganglia, similar in appearance and distribution as compared to previous MRI. Associated mass effect with 3 mm of right-to-left shift. No hydrocephalus or ventricular trapping. 2. No other new acute intracranial abnormality" -Neurology consulted and appreciated' -Dr. Erlinda Hong recommends TEE which was done today and showed no LAA/LAA thrombus and no PFO by color bubble study.  The left ventricular EF was normal with the ejection fraction of 60 to 65% and there is no significant valvular heart disease noted -Hypercoagulable work-up ordered and his PTT lupus anticoagulant, DRVVT was negative and his ANA antibody was negative -Continue Aspirin 325 mg daily for now. Holding DAPT given his hemorrhagic  transformation. -Neurology recommending now blood pressure less than 160 given his hemorrhagic conversion the long-term blood pressure goal normotensive -PT and OT recommending CIR and CIR was consulted for further evaluation recommendations -SLP evaluated and recommended dysphagia 2 diet with nectar thick liquids and are going to defer recommending instrumental swallow assessment until after the TEE; SLP evaluated and the patient now on a dysphagia 3 mechanical soft diet with nectar thick liquids -Continue monitor and follow-up on SLP recommendations -CIR Physician Dr. Posey Pronto was consulted and feels that the patient would be a good candidate for CIR -Patient is going to be discharged to CIR today -Neurology has signed off the case now and recommends a 30-day CardioNet monitor as an outpatient to rule out if atrial fibrillation -Dr. Erlinda Hong recommends follow-up with the stroke clinic at [facility] 4 weeks after discharge from rehab  Essential Hypertension -> Hypotension -Placed on Amlodipine 10 mg po Daily but will hold due to Hypotension and have discontinued it    -Currently  Was on metoprolol tartrate 5 mg IV every 12 but will stop -BP goal less than 160 given hemorrhagic conversion -He was ordered labetalol 20 mg IV once but was not given due to parameters not being met -BP is on the lower side still and is 118/73  Alcohol Dependence -Continue CIWA protocol with IV lorazepam -No signs of withdrawal -Continue Folic acid, Multivitamin + Minerals, and Thiamine supplementation  Marijuana Dependence -UDS positive for THC -Continue with Cessation efforts   Tobacco Dependence -Continue Nicotine 7 mg TD Patch -Smoking Cessation Counseling given   SIRS with  a Recurrent Fever and now leukocytosis -Patient with leukocytosis of 14.3 on admission and temperature of 100.5 F but had imporved. WBC was trending up and went from 9.0 -> 9.8 -> 10.7 -> 8.4 -> 7.0 yesterday;  patient has had a T-max of 100.6 recently but has been afebrile for the last 24-48 hours with a T-Max of 98.9 -UA unremarkable for infection, urine culture shows no growth -Blood cultures pending showed no growth to date at 5 days -Suspect secondary to the above but if continues to spike temperatures again we will panculture again as his WBC is worsening  -Continue to monitor and trend temperature curve -Currently patient is denying any shortness of breath, burning or discomfort in his urine or any other complaints and has no diarrhea -Repeat CBC within 1 week  Hypokalemia -Improved as patient's potassium is now 4.3 yesterday and not rechecked today  -Continue to Monitor and Replete as Necessary -Repeat CMP intermittently  HLD -Full lipid panel showed a total cholesterol/HDL ratio of 4.3, cholesterol level 204, HDL 47, LDL 138, triglycerides of 94, VLDL 19 -His ALT was mildly elevated at 43 yesterday and today it was 46 and will need to continue to monitor -Continue with Atorvastatin 40 mg p.o. daily  Elevated ALT -Mild and went to 46 but is now improved and is 39  the day before yesterday  -Continue monitor and trend hepatic function panel intermittently  -If worsening or not improving will need to obtain a right upper quadrant ultrasound and acute hepatitis panel  Headache, improving -Secondary to above -Not amenable acetaminophen so we will try oxycodone as we will try and avoid NSAIDs due to hemorrhagic conversion  Unwitnessed Fall -Patient fell getting out of chair and hit his Head per Nursing last week -Ordered Stat Head CT w/o Contrast as he has had a CVA with Hemorrhagic Conversion; Repeat CT showed "Evolving cerebral infarction with hemorrhagic conversion and mild leftward midline shift. No new hemorrhage, hydrocephalus, or worsening mass effect." -Notified Neurology and had no additional Recommendations -C/w Fall Precautions   Fever, improved now -Mild Temperature of  100.6 on 01/11/20 and had a temperature of 100.5 on admission -Patient is Afebrile now -Continue to Monitor for S/Sx of Infection -If Spikes a Fever again will  -Blood Cx from 01/07/20 showed NGTD at 5 Days -Repeat blood cultures if spikes another temperature; T-max over last 24 hours was 99.3 -We will obtain a chest x-ray in the a.m. -Currently mild leukocytosis has improved as WBC went from 10.7 -> 8.4 -> 7.0 yesterday -Continue monitor and trend temperature curve; Will not repeat CBC in AM as patient's Leukocytosis has resolved   Back Pain -Patient was unable to be comfortable as he been laying in the bed -We will try lidocaine patch and it seems to be helping -If continues to worsen will obtain a lumbar x-ray  Metabolic Acidosis -Was mild and is improved now -Patient's CO2 yesterday was now 23, anion gap was 11 and chloride level was 104 -Continue to monitor and trend and repeat CMP intermittently   Discharge Instructions  Discharge Instructions    Ambulatory referral to Neurology   Complete by: As directed    Follow up in stroke clinic at [facility] with Frann Rider, NP in about 4 weeks after d/c to IP rehab If not available, consider Dr. Antony Contras, Dr. Bess Harvest, or Dr. Sarina Ill.   Anticipate d/c to IP rehab in a few days   Call MD for:  difficulty breathing,  headache or visual disturbances   Complete by: As directed    Call MD for:  extreme fatigue   Complete by: As directed    Call MD for:  hives   Complete by: As directed    Call MD for:  persistant dizziness or light-headedness   Complete by: As directed    Call MD for:  persistant nausea and vomiting   Complete by: As directed    Call MD for:  redness, tenderness, or signs of infection (pain, swelling, redness, odor or green/yellow discharge around incision site)   Complete by: As directed    Call MD for:  severe uncontrolled pain   Complete by: As directed    Call MD for:   temperature >100.4   Complete by: As directed    Diet - low sodium heart healthy   Complete by: As directed    Discharge instructions   Complete by: As directed    You were cared for by a hospitalist during your hospital stay. If you have any questions about your discharge medications or the care you received while you were in the hospital after you are discharged, you can call the unit and ask to speak with the hospitalist on call if the hospitalist that took care of you is not available. Once you are discharged, your primary care physician will handle any further medical issues. Please note that NO REFILLS for any discharge medications will be authorized once you are discharged, as it is imperative that you return to your primary care physician (or establish a relationship with a primary care physician if you do not have one) for your aftercare needs so that they can reassess your need for medications and monitor your lab values.  Follow up with PCP and Neurology. Have 30 Day event monitor at D/C from CIR. Take all medications as prescribed. If symptoms change or worsen please return to the ED for evaluation   Increase activity slowly   Complete by: As directed      Allergies as of 01/15/2020   No Known Allergies     Medication List    STOP taking these medications   ibuprofen 200 MG tablet Commonly known as: ADVIL     TAKE these medications   aspirin 325 MG tablet Take 1 tablet (325 mg total) by mouth daily. Start taking on: January 16, 2020   atorvastatin 40 MG tablet Commonly known as: LIPITOR Take 1 tablet (40 mg total) by mouth daily at 6 PM.   ferrous sulfate 325 (65 FE) MG tablet Take 325 mg by mouth 2 (two) times daily.   folic acid 1 MG tablet Commonly known as: FOLVITE Take 1 tablet (1 mg total) by mouth daily. Start taking on: January 16, 2020   lidocaine 5 % Commonly known as: LIDODERM Place 1 patch onto the skin daily. Remove & Discard patch within 12 hours or as  directed by MD Start taking on: January 16, 2020   multivitamin with minerals Tabs tablet Take 1 tablet by mouth daily. Start taking on: January 16, 2020   nicotine 7 mg/24hr patch Commonly known as: NICODERM CQ - dosed in mg/24 hr Place 1 patch (7 mg total) onto the skin daily. Start taking on: January 16, 2020   senna-docusate 8.6-50 MG tablet Commonly known as: Senokot-S Take 1 tablet by mouth at bedtime as needed for mild constipation.   thiamine 100 MG tablet Take 1 tablet (100 mg total) by mouth daily. Start taking on: January 16, 2020      Follow-up Information    [facility] Follow up in 4 week(s).   Specialty: Neurology Why: stroke clinic after rehab stay. office will call with appt date and time Contact information: [address] [place]       [name], MD. Call.   Specialty: Family Medicine Why: Follow up within 1-2 weeks Contact information: [place] [address] [phone]          No Known Allergies  Consultations:  Neurology   Procedures/Studies: CT HEAD WO CONTRAST  Result Date: 01/10/2020 CLINICAL DATA:  Stroke, follow-up EXAM: CT HEAD WITHOUT CONTRAST TECHNIQUE: Contiguous axial images were obtained from the base of the skull through the vertex without intravenous contrast. COMPARISON:  01/09/2020 FINDINGS: Brain: Evolving infarction is again identified primarily involving the right frontoparietal lobes, insula, and basal ganglia. Areas of hyperdensity reflecting hemorrhagic conversion are not substantially changed. There is persistent associated mass effect including partial effacement the right lateral ventricle and leftward midline shift again measuring 3-4 mm. There is no evidence of ventricle trapping. No new hemorrhage or loss of gray-white differentiation. Vascular: No new findings. Skull: Calvarium is unremarkable. Sinuses/Orbits: No acute finding.  Other: None. IMPRESSION: Evolving cerebral infarction with hemorrhagic conversion and mild leftward midline shift. No new hemorrhage, hydrocephalus, or worsening mass effect. Electronically Signed   By: Macy Mis M.D.   On: 01/10/2020 16:29   CT HEAD WO CONTRAST  Result Date: 01/09/2020 CLINICAL DATA:  Follow-up examination for acute stroke. EXAM: CT HEAD WITHOUT CONTRAST TECHNIQUE: Contiguous axial images were obtained from the base of the skull through the vertex without intravenous contrast. COMPARISON:  Prior MRI from 01/07/2020. FINDINGS: Brain: Evolving cytotoxic edema involving the right frontal lobe is seen, with involvement of primarily the right MCA distribution, although possible involvement of right ACA territory present as well. Overall, distribution similar to previous MRI. Evidence for hemorrhagic transformation with blood products seen at the right caudate and lentiform nuclei, also similar. Associated regional mass effect with up to 3 mm of right-to-left shift. Right lateral ventricle partially effaced. No hydrocephalus or ventricular trapping. Basilar cisterns remain patent. No new large vessel territory infarct or intracranial hemorrhage. No extra-axial fluid collection. No mass lesion. Vascular: No hyperdense vessel. Skull: Scalp soft tissues and calvarium demonstrate no new finding. Sinuses/Orbits: Globes and orbital soft tissues demonstrate no acute finding. Left greater than right sphenoid sinus disease. Paranasal sinuses are otherwise largely clear. Trace left mastoid effusion noted. Other: None. IMPRESSION: 1. Evolving right cerebral infarct with evidence for hemorrhagic transformation at the right basal ganglia, similar in appearance and distribution as compared to previous MRI. Associated mass effect with 3 mm of right-to-left shift. No hydrocephalus or ventricular trapping. 2. No other new acute intracranial abnormality. Electronically Signed   By: Jeannine Boga M.D.   On:  01/09/2020 03:18   CT Head Wo Contrast  Result Date: 01/07/2020 CLINICAL DATA:  Pt came in [facility] from home with c/o of stroke symptoms. Pt was LSN at 0200 on Saturday. Pt has L-Sided Deficits, L-Sided Facial Droop, R-Sided Gaze. EMS states patient has alcohol on Friday and had a fall on Saturday and one again this morning. Pt is complaining of Head Pain. Pt had an episode of Incontinence. Pt present with untreated HTN. EXAM: CT HEAD WITHOUT CONTRAST TECHNIQUE: Contiguous axial images were obtained from the base of the skull through the vertex without intravenous contrast. COMPARISON:  10/29/2006  FINDINGS: Brain: There is patchy hypoattenuation involving the right basal ganglia including the external and internal capsules with hypoattenuation noted in a more patchy distribution extending superiorly and laterally into the posterior right frontal and adjacent right parietal lobes. Within the area of hypoattenuation, there is relative increased attenuation in portions of the caudate nucleus head and globus pallidus and putamen, which may reflect areas of petechial hemorrhage. There is associated mass effect partial effacement of the anterior horn and body of the right lateral ventricle, and slight midline shift to the left of 2-3 mm. No other areas of abnormal attenuation to suggest additional areas of infarct. No discrete masses. No evidence of hemorrhage other than the possible petechial hemorrhage in the right basal ganglia. No hydrocephalus. Vascular: Focus of increased attenuation noted right middle cerebral artery is suspicious for thrombus/embolus. Skull: Normal. Negative for fracture or focal lesion. Sinuses/Orbits: Globes and orbits are unremarkable. Small amount of dependent fluid in the left sphenoid sinus. Other: None. IMPRESSION: 1. Recent M1 segment, right middle cerebral artery infarct, with mass effect and possible petechial hemorrhage in portions of the right basal ganglia. Infarct extends to the  posterosuperior right frontal and adjacent right parietal lobes. Suspect thrombus/embolus in the M1 segment of right middle cerebral artery. 2. No other acute or recent intracranial abnormality. Electronically Signed   By: Lajean Manes M.D.   On: 01/07/2020 07:40   CT ANGIO NECK W OR WO CONTRAST  Result Date: 01/08/2020 CLINICAL DATA:  Right MCA stroke EXAM: CT ANGIOGRAPHY NECK TECHNIQUE: Multidetector CT imaging of the neck was performed using the standard protocol during bolus administration of intravenous contrast. Multiplanar CT image reconstructions and MIPs were obtained to evaluate the vascular anatomy. Carotid stenosis measurements (when applicable) are obtained utilizing NASCET criteria, using the distal internal carotid diameter as the denominator. CONTRAST:  75 mL OMNIPAQUE IOHEXOL 350 MG/ML SOLN COMPARISON:  None. FINDINGS: Aortic arch: Great vessel origins are patent. Right carotid system: Patent. Minimal calcified plaque at the ICA origin. There is no measurable stenosis or evidence of dissection. Left carotid system: Patent. There is no measurable stenosis or evidence of dissection. Vertebral arteries: Patent. No measurable stenosis or evidence of dissection. Skeleton: Mild degenerative changes, greatest at C6-C7 and C5-C6. Other neck: No mass or adenopathy. Upper chest: No apical lung mass. IMPRESSION: No large vessel occlusion or hemodynamically significant stenosis. Electronically Signed   By: Macy Mis M.D.   On: 01/08/2020 10:14   MR ANGIO HEAD WO CONTRAST  Result Date: 01/07/2020 CLINICAL DATA:  Stroke. Last seen normal 0200 hours on Saturday. Left-sided deficits. EXAM: MRI HEAD WITHOUT CONTRAST MRA HEAD WITHOUT CONTRAST TECHNIQUE: Multiplanar, multiecho pulse sequences of the brain and surrounding structures were obtained without intravenous contrast. Angiographic images of the head were obtained using MRA technique without contrast. COMPARISON:  CT head 01/07/2020 FINDINGS: MRI  HEAD FINDINGS Brain: Acute infarct right posterior MCA territory involving the right parietal cortex extending over the medial convexity. Patchy acute infarct also in the right basal ganglia with moderate amount of associated hemorrhage. This hemorrhage is mildly hyperdense on CT most likely hemorrhagic transformation of basal ganglia infarct on the right. There is local mass-effect on the right lateral ventricle with minimal midline shift to the left. No hydrocephalus. No mass lesion. Vascular: Normal arterial flow voids. Skull and upper cervical spine: No focal skull lesion. Sinuses/Orbits: Mucosal edema paranasal sinuses with air-fluid level in the sphenoid sinus. Negative orbit Other: None MRA HEAD FINDINGS Both vertebral arteries widely patent to  the basilar. PICA patent bilaterally. Basilar widely patent. Superior cerebellar and posterior cerebral arteries widely patent bilaterally. Left internal carotid artery widely patent. Left anterior and middle cerebral arteries widely patent. Azygos A2 segment. Right internal carotid artery widely patent. Right A1 segment patent. Common A2 segment. Right M1 segment patent. Moderate stenosis in the anterior division of the right M2 branch. No large vessel occlusion. IMPRESSION: Acute right MCA territory involving basal ganglia and right parietal lobe. There is hemorrhagic transformation of the right basal ganglia infarct with mass-effect on the lateral ventricle. Right M1 segment widely patent. Moderate stenosis right M2 segment anteriorly. Presumably the thrombus has recanalized given the symptoms are greater than 48 hours old. Electronically Signed   By: Franchot Gallo M.D.   On: 01/07/2020 15:22   MR BRAIN WO CONTRAST  Result Date: 01/07/2020 CLINICAL DATA:  Stroke. Last seen normal 0200 hours on Saturday. Left-sided deficits. EXAM: MRI HEAD WITHOUT CONTRAST MRA HEAD WITHOUT CONTRAST TECHNIQUE: Multiplanar, multiecho pulse sequences of the brain and surrounding  structures were obtained without intravenous contrast. Angiographic images of the head were obtained using MRA technique without contrast. COMPARISON:  CT head 01/07/2020 FINDINGS: MRI HEAD FINDINGS Brain: Acute infarct right posterior MCA territory involving the right parietal cortex extending over the medial convexity. Patchy acute infarct also in the right basal ganglia with moderate amount of associated hemorrhage. This hemorrhage is mildly hyperdense on CT most likely hemorrhagic transformation of basal ganglia infarct on the right. There is local mass-effect on the right lateral ventricle with minimal midline shift to the left. No hydrocephalus. No mass lesion. Vascular: Normal arterial flow voids. Skull and upper cervical spine: No focal skull lesion. Sinuses/Orbits: Mucosal edema paranasal sinuses with air-fluid level in the sphenoid sinus. Negative orbit Other: None MRA HEAD FINDINGS Both vertebral arteries widely patent to the basilar. PICA patent bilaterally. Basilar widely patent. Superior cerebellar and posterior cerebral arteries widely patent bilaterally. Left internal carotid artery widely patent. Left anterior and middle cerebral arteries widely patent. Azygos A2 segment. Right internal carotid artery widely patent. Right A1 segment patent. Common A2 segment. Right M1 segment patent. Moderate stenosis in the anterior division of the right M2 branch. No large vessel occlusion. IMPRESSION: Acute right MCA territory involving basal ganglia and right parietal lobe. There is hemorrhagic transformation of the right basal ganglia infarct with mass-effect on the lateral ventricle. Right M1 segment widely patent. Moderate stenosis right M2 segment anteriorly. Presumably the thrombus has recanalized given the symptoms are greater than 48 hours old. Electronically Signed   By: Franchot Gallo M.D.   On: 01/07/2020 15:22   DG Chest Port 1 View  Result Date: 01/07/2020 CLINICAL DATA:  Rule out infection.   Stroke symptoms EXAM: PORTABLE CHEST 1 VIEW COMPARISON:  12/07/2010 FINDINGS: Normal heart size and mediastinal contours. There is no edema, consolidation, effusion, or pneumothorax. Artifact from EKG leads. IMPRESSION: No evidence of active disease. Electronically Signed   By: Monte Fantasia M.D.   On: 01/07/2020 07:44   DG Swallowing Func-Speech Pathology  Result Date: 01/14/2020 Objective Swallowing Evaluation: Type of Study: MBS-Modified Barium Swallow Study  Patient Details Name: PRENTIS LANGDON MRN: [id number] Date of Birth: Jan 27, 1976 Today's Date: 01/14/2020 Time: SLP Start Time (ACUTE ONLY): 1225 -SLP Stop Time (ACUTE ONLY): 1236 SLP Time Calculation (min) (ACUTE ONLY): 11 min Past Medical History: Past Medical History: Diagnosis Date . 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 Alcohol dependence ([place])  . Back pain  . Marijuana dependence ([place])  .  Tobacco dependence  Past Surgical History: Past Surgical History: Procedure Laterality Date . BUBBLE STUDY  01/10/2020  Procedure: BUBBLE STUDY;  Surgeon: Geralynn Rile, MD;  Location: [facility];  Service: Cardiovascular;; . TEE WITHOUT CARDIOVERSION N/A 01/10/2020  Procedure: TRANSESOPHAGEAL ECHOCARDIOGRAM (TEE);  Surgeon: Geralynn Rile, MD;  Location: [facility];  Service: Cardiovascular;  Laterality: N/A; HPI:  FIELDING MAULT is a 44 y.o. male with no known medical history presenting as Code Stroke.  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 There is hemorrhagic transformation of the right basal ganglia  Subjective: Pt was lethargic Assessment / Plan / Recommendation CHL IP CLINICAL IMPRESSIONS 01/14/2020 Clinical Impression Pt demonstrates a mild dysphagia with instances of left anterior bolus loss and fairly consistent delay in swallow initiation to pyriform sinuses. Despite these deficits, pt was able to orally manage thin liquids with a straw and masticate solids without oral  residue. Pt only had instances of trace flash penetration, and one very trace frank penetration event with sensation, even when challenged with large consecutive sips.  Pt is able to upgrade diet to regular solids and thin liquids. Will f/u for tolerance and compensatory strategies as needed.  SLP Visit Diagnosis Dysphagia, oropharyngeal phase (R13.12) Attention and concentration deficit following -- Frontal lobe and executive function deficit following -- Impact on safety and function Mild aspiration risk   CHL IP TREATMENT RECOMMENDATION 01/14/2020 Treatment Recommendations Therapy as outlined in treatment plan below   Prognosis 01/14/2020 Prognosis for Safe Diet Advancement Good Barriers to Reach Goals -- Barriers/Prognosis Comment -- CHL IP DIET RECOMMENDATION 01/14/2020 SLP Diet Recommendations Regular solids;Thin liquid Liquid Administration via Cup;Straw Medication Administration Whole meds with puree Compensations Slow rate;Small sips/bites;Lingual sweep for clearance of pocketing Postural Changes Seated upright at 90 degrees   CHL IP OTHER RECOMMENDATIONS 01/14/2020 Recommended Consults -- Oral Care Recommendations Oral care BID Other Recommendations --   CHL IP FOLLOW UP RECOMMENDATIONS 01/14/2020 Follow up Recommendations Inpatient Rehab   CHL IP FREQUENCY AND DURATION 01/14/2020 Speech Therapy Frequency (ACUTE ONLY) min 2x/week Treatment Duration 2 weeks      CHL IP ORAL PHASE 01/14/2020 Oral Phase Impaired Oral - Pudding Teaspoon -- Oral - Pudding Cup -- Oral - Honey Teaspoon -- Oral - Honey Cup -- Oral - Nectar Teaspoon -- Oral - Nectar Cup -- Oral - Nectar Straw WFL Oral - Thin Teaspoon -- Oral - Thin Cup Left anterior bolus loss Oral - Thin Straw WFL Oral - Puree WFL Oral - Mech Soft -- Oral - Regular WFL Oral - Multi-Consistency -- Oral - Pill Decreased bolus cohesion Oral Phase - Comment --  CHL IP PHARYNGEAL PHASE 01/14/2020 Pharyngeal Phase Impaired Pharyngeal- Pudding Teaspoon -- Pharyngeal --  Pharyngeal- Pudding Cup -- Pharyngeal -- Pharyngeal- Honey Teaspoon -- Pharyngeal -- Pharyngeal- Honey Cup -- Pharyngeal -- Pharyngeal- Nectar Teaspoon -- Pharyngeal -- Pharyngeal- Nectar Cup -- Pharyngeal -- Pharyngeal- Nectar Straw Delayed swallow initiation-vallecula Pharyngeal -- Pharyngeal- Thin Teaspoon -- Pharyngeal -- Pharyngeal- Thin Cup Delayed swallow initiation-pyriform sinuses Pharyngeal -- Pharyngeal- Thin Straw Delayed swallow initiation-pyriform sinuses Pharyngeal -- Pharyngeal- Puree Delayed swallow initiation-vallecula Pharyngeal -- Pharyngeal- Mechanical Soft -- Pharyngeal -- Pharyngeal- Regular Delayed swallow initiation-vallecula Pharyngeal -- Pharyngeal- Multi-consistency -- Pharyngeal -- Pharyngeal- Pill -- Pharyngeal -- Pharyngeal Comment --  No flowsheet data found. DeBlois, Katherene Ponto 01/14/2020, 1:27 PM              ECHOCARDIOGRAM COMPLETE  Result Date: 01/08/2020    ECHOCARDIOGRAM  REPORT   Patient Name:   DONTAE MINERVA Date of Exam: 01/08/2020 Medical Rec #:  [id number]        Height:       74.0 in Accession #:    [id number]       Weight:       220.0 lb Date of Birth:  1976/01/08         BSA:          2.263 m Patient Age:    89 years         BP:           147/115 mmHg Patient Gender: M                HR:           77 bpm. Exam Location:  Inpatient Procedure: 2D Echo Indications:    434.91 stroke  History:        Patient has no prior history of Echocardiogram examinations.                 Stroke; Risk Factors:Current Smoker. ETOH & Tobacco use.  Sonographer:    Jannett Celestine RDCS (AE) Referring Phys: [id number] JENNIFER YATES  Sonographer Comments: Image acquisition challenging due to respiratory motion. extremely limited patient mobility IMPRESSIONS  1. Left ventricular ejection fraction, by estimation, is 60 to 65%. The left ventricle has normal function. The left ventricle has no regional wall motion abnormalities. Left ventricular diastolic parameters were normal.  2. Right ventricular  systolic function is normal. The right ventricular size is normal.  3. The mitral valve is normal in structure. No evidence of mitral valve regurgitation. No evidence of mitral stenosis.  4. The aortic valve is normal in structure. Aortic valve regurgitation is not visualized. No aortic stenosis is present.  5. The inferior vena cava is normal in size with greater than 50% respiratory variability, suggesting right atrial pressure of 3 mmHg. FINDINGS  Left Ventricle: Left ventricular ejection fraction, by estimation, is 60 to 65%. The left ventricle has normal function. The left ventricle has no regional wall motion abnormalities. The left ventricular internal cavity size was normal in size. There is  no left ventricular hypertrophy. Left ventricular diastolic parameters were normal. Normal left ventricular filling pressure. Right Ventricle: The right ventricular size is normal. No increase in right ventricular wall thickness. Right ventricular systolic function is normal. Left Atrium: Left atrial size was normal in size. Right Atrium: Right atrial size was normal in size. Pericardium: There is no evidence of pericardial effusion. Mitral Valve: The mitral valve is normal in structure. Normal mobility of the mitral valve leaflets. No evidence of mitral valve regurgitation. No evidence of mitral valve stenosis. Tricuspid Valve: The tricuspid valve is normal in structure. Tricuspid valve regurgitation is not demonstrated. No evidence of tricuspid stenosis. Aortic Valve: The aortic valve is normal in structure. Aortic valve regurgitation is not visualized. No aortic stenosis is present. Pulmonic Valve: The pulmonic valve was normal in structure. Pulmonic valve regurgitation is not visualized. No evidence of pulmonic stenosis. Aorta: The aortic root is normal in size and structure. Venous: The inferior vena cava is normal in size with greater than 50% respiratory variability, suggesting right atrial pressure of 3 mmHg.  IAS/Shunts: No atrial level shunt detected by color flow Doppler.  LEFT VENTRICLE PLAX 2D LVIDd:         5.40 cm  Diastology LVIDs:         3.20 cm  LV  e' lateral:   12.30 cm/s LV PW:         1.00 cm  LV E/e' lateral: 4.9 LV IVS:        0.90 cm  LV e' medial:    10.20 cm/s LVOT diam:     2.20 cm  LV E/e' medial:  5.9 LV SV:         61 LV SV Index:   27 LVOT Area:     3.80 cm  RIGHT VENTRICLE RV S prime:     12.50 cm/s TAPSE (M-mode): 2.2 cm LEFT ATRIUM             Index       RIGHT ATRIUM           Index LA diam:        3.40 cm 1.50 cm/m  RA Area:     14.80 cm LA Vol (A2C):   46.5 ml 20.55 ml/m RA Volume:   33.20 ml  14.67 ml/m LA Vol (A4C):   20.4 ml 9.01 ml/m LA Biplane Vol: 30.8 ml 13.61 ml/m  AORTIC VALVE LVOT Vmax:   86.80 cm/s LVOT Vmean:  51.000 cm/s LVOT VTI:    0.161 m  AORTA Ao Root diam: 3.10 cm MITRAL VALVE MV Area (PHT): 3.31 cm    SHUNTS MV Decel Time: 229 msec    Systemic VTI:  0.16 m MV E velocity: 60.40 cm/s  Systemic Diam: 2.20 cm MV A velocity: 52.70 cm/s MV E/A ratio:  1.15 Mihai Croitoru MD Electronically signed by Sanda Klein MD Signature Date/Time: 01/08/2020/2:37:28 PM    Final    ECHO TEE  Result Date: 01/10/2020    TRANSESOPHOGEAL ECHO REPORT   Patient Name:   Alvira Philips Date of Exam: 01/10/2020 Medical Rec #:  [id number]        Height:       74.0 in Accession #:    [id number]       Weight:       220.0 lb Date of Birth:  07/13/1976         BSA:          2.263 m Patient Age:    35 years         BP:           125/82 mmHg Patient Gender: M                HR:           82 bpm. Exam Location:  Inpatient Procedure: 2D Echo Indications:    stroke 434.91  History:        Patient has prior history of Echocardiogram examinations, most                 recent 01/08/2020. Stroke; Risk Factors:Current Smoker. ETOH &                 Tobacco use.  Sonographer:    Jannett Celestine RDCS (AE) Referring Phys: [id number] CHRISTOPHER RONALD BERGE PROCEDURE: After discussion of the risks and benefits of a  TEE, an informed consent was obtained from the patient. TEE procedure time was 15 minutes. The transesophogeal probe was passed without difficulty through the esophogus of the patient. Imaged were obtained with the patient in a left lateral decubitus position. Local oropharyngeal anesthetic was provided with viscous lidocaine. Sedation performed by different physician. The patient was monitored while under deep sedation. Anesthestetic sedation was provided intravenously by Anesthesiology: '250mg'$  of Propofol.  Image quality was excellent. The patient's vital signs; including heart rate, blood pressure, and oxygen saturation; remained stable throughout the procedure. The patient developed  no complications during the procedure. IMPRESSIONS  1. Left ventricular ejection fraction, by estimation, is 60 to 65%. The left ventricle has normal function. The left ventricle has no regional wall motion abnormalities.  2. Right ventricular systolic function is normal. The right ventricular size is normal.  3. No left atrial/left atrial appendage thrombus was detected. The LAA emptying velocity was 87 cm/s.  4. Redundant chordal tissue present. The mitral valve is normal in structure. Trivial mitral valve regurgitation. No evidence of mitral stenosis.  5. The aortic valve is tricuspid. Aortic valve regurgitation is not visualized. No aortic stenosis is present.  6. Agitated saline contrast bubble study was negative, with no evidence of any interatrial shunt. Conclusion(s)/Recommendation(s): Normal biventricular function without evidence of hemodynamically significant valvular heart disease. No LA/LAA thrombus identified. Negative bubble study for interatrial shunt. No intracardiac source of embolism detected  on this on this transesophageal echocardiogram. FINDINGS  Left Ventricle: Left ventricular ejection fraction, by estimation, is 60 to 65%. The left ventricle has normal function. The left ventricle has no regional wall motion  abnormalities. The left ventricular internal cavity size was normal in size. There is  no left ventricular hypertrophy. Right Ventricle: The right ventricular size is normal. No increase in right ventricular wall thickness. Right ventricular systolic function is normal. Left Atrium: Left atrial size was normal in size. No left atrial/left atrial appendage thrombus was detected. The LAA emptying velocity was 87 cm/s. Right Atrium: Right atrial size was normal in size. Pericardium: Trivial pericardial effusion is present. The pericardial effusion is circumferential. Mitral Valve: Redundant chordal tissue present. The mitral valve is normal in structure. Trivial mitral valve regurgitation. No evidence of mitral valve stenosis. Tricuspid Valve: The tricuspid valve is normal in structure. Tricuspid valve regurgitation is trivial. No evidence of tricuspid stenosis. Aortic Valve: The aortic valve is tricuspid. Aortic valve regurgitation is not visualized. No aortic stenosis is present. Pulmonic Valve: The pulmonic valve was normal in structure. Pulmonic valve regurgitation is trivial. No evidence of pulmonic stenosis. Aorta: The aortic root, ascending aorta and aortic arch are all structurally normal, with no evidence of dilitation or obstruction. Venous: The right upper pulmonary vein and left upper pulmonary vein are normal. IAS/Shunts: No atrial level shunt detected by color flow Doppler. Agitated saline contrast was given intravenously to evaluate for intracardiac shunting. Agitated saline contrast bubble study was negative, with no evidence of any interatrial shunt. EKG: Rhythm strip during this exam demostrated normal sinus rhythm.   AORTA Ao Root diam: 3.60 cm Ao Asc diam:  3.00 cm TRICUSPID VALVE TR Peak grad:   9.0 mmHg TR Vmax:        150.00 cm/s Eleonore Chiquito MD Electronically signed by Eleonore Chiquito MD Signature Date/Time: 01/10/2020/10:16:22 AM    Final    VAS US CAROTID (at [facility] and [facility] only)  Result Date:  01/11/2020 Carotid Arterial Duplex Study Indications:       CVA. Risk Factors:      Prior CVA. Comparison Study:  no prior Performing Technologist: Abram Sander RVS  Examination Guidelines: A complete evaluation includes B-mode imaging, spectral Doppler, color Doppler, and power Doppler as needed of all accessible portions of each vessel. Bilateral testing is considered an integral part of a complete examination. Limited examinations for reoccurring indications may be performed as noted.  Right Carotid Findings: +----------+--------+--------+--------+------------------+--------+  PSV cm/sEDV cm/sStenosisPlaque DescriptionComments +----------+--------+--------+--------+------------------+--------+ CCA Prox  75      7               heterogenous               +----------+--------+--------+--------+------------------+--------+ CCA Distal57      13              heterogenous               +----------+--------+--------+--------+------------------+--------+ ICA Prox  85      36      1-39%   heterogenous      tortuous +----------+--------+--------+--------+------------------+--------+ ICA Distal132     60                                tortuous +----------+--------+--------+--------+------------------+--------+ ECA       81      14                                         +----------+--------+--------+--------+------------------+--------+ +----------+--------+-------+--------+-------------------+           PSV cm/sEDV cmsDescribeArm Pressure (mmHG) +----------+--------+-------+--------+-------------------+ [id number]      17                                 +----------+--------+-------+--------+-------------------+ +---------+--------+--+--------+--+---------+ VertebralPSV cm/s60EDV cm/s24Antegrade +---------+--------+--+--------+--+---------+  Left Carotid Findings: +----------+--------+--------+--------+------------------+--------+           PSV  cm/sEDV cm/sStenosisPlaque DescriptionComments +----------+--------+--------+--------+------------------+--------+ CCA Prox  77      17              heterogenous               +----------+--------+--------+--------+------------------+--------+ CCA Distal71      18              heterogenous               +----------+--------+--------+--------+------------------+--------+ ICA Prox  59      21      1-39%   heterogenous               +----------+--------+--------+--------+------------------+--------+ ICA Distal66      24                                         +----------+--------+--------+--------+------------------+--------+ ECA       51      10                                         +----------+--------+--------+--------+------------------+--------+ +----------+--------+--------+--------+-------------------+           PSV cm/sEDV cm/sDescribeArm Pressure (mmHG) +----------+--------+--------+--------+-------------------+ [id number]                                          +----------+--------+--------+--------+-------------------+ +---------+--------+--+--------+--+---------+ VertebralPSV cm/s73EDV cm/s13Antegrade +---------+--------+--+--------+--+---------+   Summary: Right Carotid: Velocities in the right ICA are consistent with a 1-39% stenosis. Left Carotid: Velocities in the left ICA are consistent with a 1-39% stenosis. Vertebrals: Bilateral vertebral arteries demonstrate antegrade  flow. *See table(s) above for measurements and observations.  Electronically signed by Rosalin Hawking MD on 01/11/2020 at 8:34:18 AM.    Final    VAS [place] LOWER EXTREMITY VENOUS (DVT)  Result Date: 01/08/2020  Lower Venous DVTStudy Indications: Stroke.  Comparison Study: No prior study Performing Technologist: Maudry Mayhew MHA, RDMS, RVT, RDCS  Examination Guidelines: A complete evaluation includes B-mode imaging, spectral Doppler, color Doppler, and power Doppler as  needed of all accessible portions of each vessel. Bilateral testing is considered an integral part of a complete examination. Limited examinations for reoccurring indications may be performed as noted. The reflux portion of the exam is performed with the patient in reverse Trendelenburg.  +---------+---------------+---------+-----------+----------+--------------+ RIGHT    CompressibilityPhasicitySpontaneityPropertiesThrombus Aging +---------+---------------+---------+-----------+----------+--------------+ CFV      Full           Yes      Yes                                 +---------+---------------+---------+-----------+----------+--------------+ SFJ      Full                                                        +---------+---------------+---------+-----------+----------+--------------+ FV Prox  Full                                                        +---------+---------------+---------+-----------+----------+--------------+ FV Mid   Full                                                        +---------+---------------+---------+-----------+----------+--------------+ FV DistalFull                                                        +---------+---------------+---------+-----------+----------+--------------+ PFV      Full                                                        +---------+---------------+---------+-----------+----------+--------------+ POP      Full           Yes      Yes                                 +---------+---------------+---------+-----------+----------+--------------+ PTV      Full                                                        +---------+---------------+---------+-----------+----------+--------------+  PERO     Full                                                        +---------+---------------+---------+-----------+----------+--------------+    +---------+---------------+---------+-----------+----------+--------------+ LEFT     CompressibilityPhasicitySpontaneityPropertiesThrombus Aging +---------+---------------+---------+-----------+----------+--------------+ CFV      Full           Yes      Yes                                 +---------+---------------+---------+-----------+----------+--------------+ SFJ      Full                                                        +---------+---------------+---------+-----------+----------+--------------+ FV Prox  Full                                                        +---------+---------------+---------+-----------+----------+--------------+ FV Mid   Full                                                        +---------+---------------+---------+-----------+----------+--------------+ FV DistalFull                                                        +---------+---------------+---------+-----------+----------+--------------+ PFV      Full                                                        +---------+---------------+---------+-----------+----------+--------------+ POP      Full           Yes      Yes                                 +---------+---------------+---------+-----------+----------+--------------+ PTV      Full                                                        +---------+---------------+---------+-----------+----------+--------------+ PERO     Full                                                        +---------+---------------+---------+-----------+----------+--------------+  Summary: RIGHT: - There is no evidence of deep vein thrombosis in the lower extremity.  - No cystic structure found in the popliteal fossa.  LEFT: - There is no evidence of deep vein thrombosis in the lower extremity.  - No cystic structure found in the popliteal fossa.  *See table(s) above for measurements and observations. Electronically signed  by Harold Barban MD on 01/08/2020 at 5:49:38 PM.    Final    ECHOCARDIOGRAM IMPRESSIONS    1. Left ventricular ejection fraction, by estimation, is 60 to 65%. The  left ventricle has normal function. The left ventricle has no regional  wall motion abnormalities. Left ventricular diastolic parameters were  normal.  2. Right ventricular systolic function is normal. The right ventricular  size is normal.  3. The mitral valve is normal in structure. No evidence of mitral valve  regurgitation. No evidence of mitral stenosis.  4. The aortic valve is normal in structure. Aortic valve regurgitation is  not visualized. No aortic stenosis is present.  5. The inferior vena cava is normal in size with greater than 50%  respiratory variability, suggesting right atrial pressure of 3 mmHg.   FINDINGS  Left Ventricle: Left ventricular ejection fraction, by estimation, is 60  to 65%. The left ventricle has normal function. The left ventricle has no  regional wall motion abnormalities. The left ventricular internal cavity  size was normal in size. There is  no left ventricular hypertrophy. Left ventricular diastolic parameters  were normal. Normal left ventricular filling pressure.   Right Ventricle: The right ventricular size is normal. No increase in  right ventricular wall thickness. Right ventricular systolic function is  normal.   Left Atrium: Left atrial size was normal in size.   Right Atrium: Right atrial size was normal in size.   Pericardium: There is no evidence of pericardial effusion.   Mitral Valve: The mitral valve is normal in structure. Normal mobility of  the mitral valve leaflets. No evidence of mitral valve regurgitation. No  evidence of mitral valve stenosis.   Tricuspid Valve: The tricuspid valve is normal in structure. Tricuspid  valve regurgitation is not demonstrated. No evidence of tricuspid  stenosis.   Aortic Valve: The aortic valve is normal in structure.  Aortic valve  regurgitation is not visualized. No aortic stenosis is present.   Pulmonic Valve: The pulmonic valve was normal in structure. Pulmonic valve  regurgitation is not visualized. No evidence of pulmonic stenosis.   Aorta: The aortic root is normal in size and structure.   Venous: The inferior vena cava is normal in size with greater than 50%  respiratory variability, suggesting right atrial pressure of 3 mmHg.   IAS/Shunts: No atrial level shunt detected by color flow Doppler.     LEFT VENTRICLE  PLAX 2D  LVIDd:     5.40 cm Diastology  LVIDs:     3.20 cm LV e' lateral:  12.30 cm/s  LV PW:     1.00 cm LV E/e' lateral: 4.9  LV IVS:    0.90 cm LV e' medial:  10.20 cm/s  LVOT diam:   2.20 cm LV E/e' medial: 5.9  LV SV:     61  LV SV Index:  27  LVOT Area:   3.80 cm     RIGHT VENTRICLE  RV S prime:   12.50 cm/s  TAPSE (M-mode): 2.2 cm   LEFT ATRIUM       Index    RIGHT ATRIUM  Index  LA diam:    3.40 cm 1.50 cm/m RA Area:   14.80 cm  LA Vol (A2C):  46.5 ml 20.55 ml/m RA Volume:  33.20 ml 14.67 ml/m  LA Vol (A4C):  20.4 ml 9.01 ml/m  LA Biplane Vol: 30.8 ml 13.61 ml/m  AORTIC VALVE  LVOT Vmax:  86.80 cm/s  LVOT Vmean: 51.000 cm/s  LVOT VTI:  0.161 m    AORTA  Ao Root diam: 3.10 cm   MITRAL VALVE  MV Area (PHT): 3.31 cm  SHUNTS  MV Decel Time: 229 msec  Systemic VTI: 0.16 m  MV E velocity: 60.40 cm/s Systemic Diam: 2.20 cm  MV A velocity: 52.70 cm/s  MV E/A ratio: 1.15   LOWER EXTREMITY DUPLEX RIGHT:  - There is no evidence of deep vein thrombosis in the lower extremity.    - No cystic structure found in the popliteal fossa.    LEFT:  - There is no evidence of deep vein thrombosis in the lower extremity.    - No cystic structure found in the popliteal fossa.   TEE 01/10/20 1. No LAA/LA thrombus.  2. No PFO by color or bubble study.  3. LVEF normal 60-65%. 4.  No significant valvular heart disease.   IMPRESSIONS    1. Left ventricular ejection fraction, by estimation, is 60 to 65%. The  left ventricle has normal function. The left ventricle has no regional  wall motion abnormalities.  2. Right ventricular systolic function is normal. The right ventricular  size is normal.  3. No left atrial/left atrial appendage thrombus was detected. The LAA  emptying velocity was 87 cm/s.  4. Redundant chordal tissue present. The mitral valve is normal in  structure. Trivial mitral valve regurgitation. No evidence of mitral  stenosis.  5. The aortic valve is tricuspid. Aortic valve regurgitation is not  visualized. No aortic stenosis is present.  6. Agitated saline contrast bubble study was negative, with no evidence  of any interatrial shunt.   Conclusion(s)/Recommendation(s): Normal biventricular function without  evidence of hemodynamically significant valvular heart disease. No LA/LAA  thrombus identified. Negative bubble study for interatrial shunt. No  intracardiac source of embolism detected  on this on this transesophageal echocardiogram.   FINDINGS  Left Ventricle: Left ventricular ejection fraction, by estimation, is 60  to 65%. The left ventricle has normal function. The left ventricle has no  regional wall motion abnormalities. The left ventricular internal cavity  size was normal in size. There is  no left ventricular hypertrophy.   Right Ventricle: The right ventricular size is normal. No increase in  right ventricular wall thickness. Right ventricular systolic function is  normal.   Left Atrium: Left atrial size was normal in size. No left atrial/left  atrial appendage thrombus was detected. The LAA emptying velocity was 87  cm/s.   Right Atrium: Right atrial size was normal in size.   Pericardium: Trivial pericardial effusion is present. The pericardial  effusion is circumferential.   Mitral Valve: Redundant chordal  tissue present. The mitral valve is normal  in structure. Trivial mitral valve regurgitation. No evidence of mitral  valve stenosis.   Tricuspid Valve: The tricuspid valve is normal in structure. Tricuspid  valve regurgitation is trivial. No evidence of tricuspid stenosis.   Aortic Valve: The aortic valve is tricuspid. Aortic valve regurgitation is  not visualized. No aortic stenosis is present.   Pulmonic Valve: The pulmonic valve was normal in structure. Pulmonic valve  regurgitation is trivial. No  evidence of pulmonic stenosis.   Aorta: The aortic root, ascending aorta and aortic arch are all  structurally normal, with no evidence of dilitation or obstruction.   Venous: The right upper pulmonary vein and left upper pulmonary vein are  normal.   IAS/Shunts: No atrial level shunt detected by color flow Doppler. Agitated  saline contrast was given intravenously to evaluate for intracardiac  shunting. Agitated saline contrast bubble study was negative, with no  evidence of any interatrial shunt.   EKG: Rhythm strip during this exam demostrated normal sinus rhythm.       AORTA  Ao Root diam: 3.60 cm  Ao Asc diam: 3.00 cm   TRICUSPID VALVE  TR Peak grad:  9.0 mmHg  TR Vmax:    150.00 cm/s     Subjective: Seen and examined at bedside he is calm and not complaining of back pain today. He denies nausea or vomiting. He feels well. No other concerns or complaints at this time. Ready to go to rehab.  Discharge Exam: Vitals:   01/15/20 0816 01/15/20 1152  BP:  118/73  Pulse: 75 78  Resp:  16  Temp: 98.5 F (36.9 C) 97.9 F (36.6 C)  SpO2: 97% 100%   Vitals:   01/15/20 0503 01/15/20 0815 01/15/20 0816 01/15/20 1152  BP: 106/75 109/66  118/73  Pulse: 79  75 78  Resp: '18 17  16  '$ Temp: 98.4 F (36.9 C)  98.5 F (36.9 C) 97.9 F (36.6 C)  TempSrc: Oral  Oral Oral  SpO2: 97%  97% 100%  Weight:      Height:       General: Pt is alert, awake, not in acute  distress Cardiovascular: RRR, S1/S2 +, no rubs, no gallops Respiratory: Slightly diminished bilaterally, no wheezing, no rhonchi Abdominal: Soft, NT, ND, bowel sounds + Extremities: no edema, no cyanosis; has some left-sided hemiparesis and hemiplegia  The results of significant diagnostics from this hospitalization (including imaging, microbiology, ancillary and laboratory) are listed below for reference.    Microbiology: Recent Results (from the past 240 hour(s))  Blood culture (routine x 2)     Status: None   Collection Time: 01/07/20  8:07 AM   Specimen: BLOOD RIGHT ARM  Result Value Ref Range Status   Specimen Description BLOOD RIGHT ARM  Final   Special Requests   Final    BOTTLES DRAWN AEROBIC AND ANAEROBIC Blood Culture adequate volume   Culture   Final    NO GROWTH 5 DAYS Performed at [facility], [address], [place], [place] [address]    Report Status 01/12/2020 FINAL  Final  SARS CORONAVIRUS 2 (TAT 6-24 HRS) Nasopharyngeal Nasopharyngeal Swab     Status: None   Collection Time: 01/07/20  9:34 AM   Specimen: Nasopharyngeal Swab  Result Value Ref Range Status   SARS Coronavirus 2 NEGATIVE NEGATIVE Final    Comment: (NOTE) SARS-CoV-2 target nucleic acids are NOT DETECTED. The SARS-CoV-2 RNA is generally detectable in upper and lower respiratory specimens during the acute phase of infection. Negative results do not preclude SARS-CoV-2 infection, do not rule out co-infections with other pathogens, and should not be used as the sole basis for treatment or other patient management decisions. Negative results must be combined with clinical observations, patient history, and epidemiological information. The expected result is Negative. Fact Sheet for Patients: SugarRoll.be Fact Sheet for Healthcare Providers: https://www.woods-mathews.com/ This test is not yet approved or cleared by the [place] FDA and  has been  authorized for detection and/or diagnosis of SARS-CoV-2 by FDA under an Emergency Use Authorization (EUA). This EUA will remain  in effect (meaning this test can be used) for the duration of the COVID-19 declaration under Section 56 4(b)(1) of the Act, 21 U.S.C. section 360bbb-3(b)(1), unless the authorization is terminated or revoked sooner. Performed at [facility], [place] [address] [place], [place] [address]   Urine culture     Status: None   Collection Time: 01/07/20 10:47 AM   Specimen: In/Out Cath Urine  Result Value Ref Range Status   Specimen Description IN/OUT CATH URINE  Final   Special Requests NONE  Final   Culture   Final    NO GROWTH Performed at [facility], [place] [address] [place], [place] [address]    Report Status 01/08/2020 FINAL  Final     Labs: BNP (last 3 results) No results for input(s): BNP in the last 8760 hours. Basic Metabolic Panel: Recent Labs  Lab 01/09/20 0429 01/10/20 0517 01/11/20 0415 01/12/20 0404 01/13/20 0416  NA 140 138 138 138 138  K 3.4* 3.9 4.1 4.1 4.3  CL 107 108 103 106 104  CO2 24 22 21* 21* 23  GLUCOSE 98 101* 84 94 96  BUN '9 10 11 13 14  '$ CREATININE 0.88 0.92 0.89 0.90 0.97  CALCIUM 8.9 8.8* 9.2 9.0 9.3  MG  --  2.1 2.2 2.2 2.2  PHOS  --  3.6 4.1 4.7* 4.1   Liver Function Tests: Recent Labs  Lab 01/10/20 0517 01/11/20 0415 01/12/20 0404 01/13/20 0416  AST 43* 46* 40 39  ALT 33 33 39 36  ALKPHOS 64 67 71 71  BILITOT 0.5 0.8 0.6 0.8  PROT 6.4* 6.8 7.0 7.1  ALBUMIN 3.2* 3.4* 3.5 3.4*   No results for input(s): LIPASE, AMYLASE in the last 168 hours. No results for input(s): AMMONIA in the last 168 hours. CBC: Recent Labs  Lab 01/10/20 0517 01/11/20 0415 01/12/20 0404 01/13/20 0416 01/14/20 0415  WBC 9.0 9.8 10.7* 8.4 7.0  NEUTROABS 5.9 6.9 7.3 5.3 4.1  HGB 13.8 14.0 14.7 15.0 14.6  HCT 41.0 42.8 44.1 44.7 43.3  MCV 87.8 89.2 89.3 87.8 87.8  PLT 233 267 265 280 276   Cardiac Enzymes: No  results for input(s): CKTOTAL, CKMB, CKMBINDEX, TROPONINI in the last 168 hours. BNP: Invalid input(s): POCBNP CBG: No results for input(s): GLUCAP in the last 168 hours. D-Dimer No results for input(s): DDIMER in the last 72 hours. Hgb A1c No results for input(s): HGBA1C in the last 72 hours. Lipid Profile No results for input(s): CHOL, HDL, LDLCALC, TRIG, CHOLHDL, LDLDIRECT in the last 72 hours. Thyroid function studies No results for input(s): TSH, T4TOTAL, T3FREE, THYROIDAB in the last 72 hours.  Invalid input(s): FREET3 Anemia work up No results for input(s): VITAMINB12, FOLATE, FERRITIN, TIBC, IRON, RETICCTPCT in the last 72 hours. Urinalysis    Component Value Date/Time   COLORURINE YELLOW 01/07/2020 1104   APPEARANCEUR CLEAR 01/07/2020 1104   LABSPEC 1.028 01/07/2020 1104   PHURINE 6.0 01/07/2020 1104   GLUCOSEU NEGATIVE 01/07/2020 1104   HGBUR SMALL (A) 01/07/2020 1104   BILIRUBINUR NEGATIVE 01/07/2020 1104   KETONESUR 5 (A) 01/07/2020 1104   PROTEINUR 100 (A) 01/07/2020 1104   NITRITE NEGATIVE 01/07/2020 1104   LEUKOCYTESUR NEGATIVE 01/07/2020 1104   Sepsis Labs Invalid input(s): PROCALCITONIN,  WBC,  LACTICIDVEN Microbiology Recent Results (from the past 240 hour(s))  Blood culture (  routine x 2)     Status: None   Collection Time: 01/07/20  8:07 AM   Specimen: BLOOD RIGHT ARM  Result Value Ref Range Status   Specimen Description BLOOD RIGHT ARM  Final   Special Requests   Final    BOTTLES DRAWN AEROBIC AND ANAEROBIC Blood Culture adequate volume   Culture   Final    NO GROWTH 5 DAYS Performed at [facility], [address], [place], [place] [address]    Report Status 01/12/2020 FINAL  Final  SARS CORONAVIRUS 2 (TAT 6-24 HRS) Nasopharyngeal Nasopharyngeal Swab     Status: None   Collection Time: 01/07/20  9:34 AM   Specimen: Nasopharyngeal Swab  Result Value Ref Range Status   SARS Coronavirus 2 NEGATIVE NEGATIVE Final    Comment:  (NOTE) SARS-CoV-2 target nucleic acids are NOT DETECTED. The SARS-CoV-2 RNA is generally detectable in upper and lower respiratory specimens during the acute phase of infection. Negative results do not preclude SARS-CoV-2 infection, do not rule out co-infections with other pathogens, and should not be used as the sole basis for treatment or other patient management decisions. Negative results must be combined with clinical observations, patient history, and epidemiological information. The expected result is Negative. Fact Sheet for Patients: SugarRoll.be Fact Sheet for Healthcare Providers: https://www.woods-mathews.com/ This test is not yet approved or cleared by the [place] FDA and  has been authorized for detection and/or diagnosis of SARS-CoV-2 by FDA under an Emergency Use Authorization (EUA). This EUA will remain  in effect (meaning this test can be used) for the duration of the COVID-19 declaration under Section 56 4(b)(1) of the Act, 21 U.S.C. section 360bbb-3(b)(1), unless the authorization is terminated or revoked sooner. Performed at [facility], [place] [address], [place], [place] [address]   Urine culture     Status: None   Collection Time: 01/07/20 10:47 AM   Specimen: In/Out Cath Urine  Result Value Ref Range Status   Specimen Description IN/OUT CATH URINE  Final   Special Requests NONE  Final   Culture   Final    NO GROWTH Performed at [facility], [place] [address], [place], [place] [address]    Report Status 01/08/2020 FINAL  Final   Time coordinating discharge: 35 minutes  SIGNED:  Kerney Elbe, DO [facility] 01/15/2020, 2:59 PM Pager is on [email or web address]  If 7PM-7AM, please contact night-coverage [email or web address] Password [email or web address]

## 2020-01-15 NOTE — Progress Notes (Signed)
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  Patinet is to use a steady for transfers, takes polls whole with water or in apple sauce.  Last BM documented on 3/14 uses APAP for pain and Lovenox for VTE. Patient arrived to the unit about 1830-1840.

## 2020-01-15 NOTE — PMR Pre-admission (Signed)
PMR Admission Coordinator Pre-Admission Assessment  Patient: Derek [name] is an 44 y.o., male MRN: [id number] DOB: Feb 01, 1976 Height: '6\' 2"'$  (188 cm) Weight: 99.8 kg             Insurance Information HMO:     PPO: yes     PCP:      IPA:      80/20:      OTHER:  PRIMARY: [employer] [place]      Policy#: [id number]      Subscriber: pt CM Name: Kenney Houseman      Phone#: [phone]     Fax#: [phone] Pre-Cert#: [id number] auth for CIR provided by Kenney Houseman and [facility], for 3/16 admit with updates due to fax listed above on 3/23.       Employer: n/a Benefits:  Phone #: [phone]     Name: n/a Eff. Date: 01/11/2020     Deduct: $3300 (met $0)      Out of Pocket Max: [phone] (met $0)      Life Max: n/a CIR: 75%      SNF: 75% limit to 60 days Outpatient: 75%     Co-Ins: 25% Home Health: 75%      Co-Ins: 25% DME: 75%     Co-Ins: 25% Providers: in network  SECONDARY:       Policy#:       Subscriber:  CM Name:       Phone#:      Fax#:  Pre-Cert#:       Employer:  Benefits:  Phone #:      Name:  Eff. Date:      Deduct:       Out of Pocket Max:       Life Max:  CIR:       SNF:  Outpatient:      Co-Pay:  Home Health:       Co-Pay:  DME:      Co-Pay:   Medicaid Application Date:       Case Manager:  Disability Application Date:       Case Worker:   The "Data Collection Information Summary" for patients in Inpatient Rehabilitation Facilities with attached "Privacy Act [place] Records" was provided and verbally reviewed with: N/A  Emergency Contact Information Contact Information    Name Relation Home Work Mobile   Griffin,Jayvetta Sister [phone]     GF,Kathy Significant other   [phone]     Current Medical History  Patient Admitting Diagnosis: R MCA CVA  History of Present Illness: Derek [name] is a 44 year old right-handed male with history of alcohol/tobacco/marijuana use as well as chronic back pain.  Presented 01/07/2020 with left hemiparesis.  Blood pressure 184/110.   Cranial CT scan showed right MCA infarction with mass-effect possible petechial hemorrhage in portions of the right basal ganglia.  Infarcts extending to the posterior superior right frontal and adjacent right parietal lobe.  Suspect thrombus/embolus in the M1 segment of right middle cerebral artery.  MRI showed acute right MCA infarction involving basal ganglia and right parietal lobe.  Hemorrhagic transformation of the right basal ganglia infarction with mass-effect on the lateral ventricle.  Echocardiogram with ejection fraction 65%.  Lower extremity Dopplers negative for DVT.  Admission chemistries unremarkable except glucose 134, WBC 14,300, CK 787, blood cultures no growth to date, lactic acid within normal limits 1.9, urine culture no growth, urine drug screen positive marijuana.  Neurology consulted with TEE completed 01/10/2020 showing no PFO /Thrombus. and currently maintained on aspirin  325 mg daily for CVA prophylaxis.  Hold on DAPT at this time given hemorrhagic transformation.  Follow-up repeat cranial CT scan 01/09/2020 evolving right MCA infarction again with hemorrhagic transformation, 3 mm right to left shift.  No plan for 3% saline at this time and scan was again completed 01/10/2020 showing no new hemorrhage hydrocephalus or worsening mass-effect.  Lovenox was initiated for DVT prophylaxis 01/10/2020.  Patient did have a low-grade fever 100.5 leukocytosis 14,300 improved to 7.0.  Urine culture no growth urinalysis negative blood cultures no growth to date.  Pt did have a fall on 01/10/20 with soft tissue injury to head, CT stable.  Therapy evaluations completed and patient was recommended for a comprehensive rehab program.   Complete NIHSS TOTAL: 11  Past Medical History  Past Medical History:  Diagnosis Date  . Acute ischemic right MCA stroke ([place]) 01/07/2020  . Alcohol dependence ([place])   . Back pain   . Marijuana dependence ([place])   . Tobacco dependence     Family History  family  history is not on file.  Prior Rehab/Hospitalizations:  Has the patient had prior rehab or hospitalizations prior to admission? No  Has the patient had major surgery during 100 days prior to admission? No  Current Medications   Current Facility-Administered Medications:  .  0.9 %  sodium chloride infusion, , Intravenous, Continuous, O'Neal, Cassie Freer, MD, Last Rate: 50 mL/hr at 01/09/20 1343, New Bag at 01/09/20 1343 .  acetaminophen (TYLENOL) tablet 650 mg, 650 mg, Oral, Q4H PRN, 650 mg at 01/15/20 0448 **OR** acetaminophen (TYLENOL) 160 MG/5ML solution 650 mg, 650 mg, Per Tube, Q4H PRN **OR** acetaminophen (TYLENOL) suppository 650 mg, 650 mg, Rectal, Q4H PRN, O'Neal, Cassie Freer, MD .  aspirin suppository 300 mg, 300 mg, Rectal, Daily **OR** aspirin tablet 325 mg, 325 mg, Oral, Daily, O'Neal, Cassie Freer, MD, 325 mg at 01/15/20 1030 .  atorvastatin (LIPITOR) tablet 40 mg, 40 mg, Oral, q1800, O'Neal, Cassie Freer, MD, 40 mg at 01/14/20 1720 .  enoxaparin (LOVENOX) injection 40 mg, 40 mg, Subcutaneous, Q24H, Rosalin Hawking, MD, 40 mg at 11/57/26 2035 .  folic acid (FOLVITE) tablet 1 mg, 1 mg, Oral, Daily, O'Neal, Cassie Freer, MD, 1 mg at 01/15/20 1030 .  labetalol (NORMODYNE) injection 20 mg, 20 mg, Intravenous, Once, O'Neal, Cassie Freer, MD, Stopped at 01/07/20 1054 .  lidocaine (LIDODERM) 5 % 1 patch, 1 patch, Transdermal, Q24H, Sheikh, Bettles, DO, 1 patch at 01/15/20 1030 .  multivitamin with minerals tablet 1 tablet, 1 tablet, Oral, Daily, O'Neal, Cassie Freer, MD, 1 tablet at 01/15/20 1030 .  nicotine (NICODERM CQ - dosed in mg/24 hr) patch 7 mg, 7 mg, Transdermal, Daily, O'Neal, Cassie Freer, MD, 7 mg at 01/11/20 1014 .  oxyCODONE (Oxy IR/ROXICODONE) immediate release tablet 5 mg, 5 mg, Oral, Q6H PRN, O'Neal, Cassie Freer, MD, 5 mg at 01/11/20 2116 .  [employer], , Oral, PRN, O'Neal, Cassie Freer, MD .  senna-docusate (Senokot-S) tablet 1 tablet, 1  tablet, Oral, QHS PRN, O'Neal, Cassie Freer, MD, 1 tablet at 01/15/20 0448 .  thiamine tablet 100 mg, 100 mg, Oral, Daily, 100 mg at 01/15/20 1030 **OR** thiamine (B-1) injection 100 mg, 100 mg, Intravenous, Daily, O'Neal, Cassie Freer, MD, 100 mg at 01/13/20 5974  Patients Current Diet:  Diet Order            Diet regular Room service appropriate? 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  Independent  Indoor Mobility: Did the patient need assistance with walking from room to room (with or without device)? Independent  Stairs: Did the patient need assistance with internal or external stairs (with or without device)? Independent  Functional Cognition: Did the patient need help planning regular tasks such as shopping or remembering to take medications? Independent  Home Assistive Devices / Equipment Home Assistive Devices/Equipment: None  Prior Device Use: Indicate devices/aids used by the patient prior to current illness, exacerbation or injury? None of the above  Current Functional Level Cognition  Arousal/Alertness: Lethargic Overall Cognitive Status: Impaired/Different from baseline Difficult to assess due to: Level of arousal Orientation Level: Oriented X4 Safety/Judgement: Decreased awareness of safety, Decreased awareness of deficits General  Comments: Pt able to follow commands, cues for attending L side, responding only when asked a direct questions, otherwise not very talkative Attention: Sustained Sustained Attention: Appears intact Memory: Impaired Memory Impairment: Decreased short term memory Decreased Short Term Memory: Verbal basic Awareness: Appears intact Problem Solving: Impaired Problem Solving Impairment: Verbal complex Safety/Judgment: Appears intact    Extremity Assessment (includes Sensation/Coordination)  Upper Extremity Assessment: LUE deficits/detail LUE Deficits / Details: no activation and decreased awareness to L UE compared to LLE  Lower Extremity Assessment: Defer to PT evaluation LLE Deficits / Details: mildly decreased tone with PROM, no active movement noted, but supported some in standing with L LE    ADLs  Overall ADL's : Needs assistance/impaired Eating/Feeding: Moderate assistance Eating/Feeding Details (indicate cue type and reason): Attempted to cut meat with R hand with moderate success, education provided in placement of objects in L hand for stabilization as well as mod A to locate items on tray and cut meat fully Grooming: Wash/dry hands Grooming Details (indicate cue type and reason): cues to incorporate LUE Upper Body Bathing: Maximal assistance Lower Body Bathing: Total assistance Upper Body Dressing : Maximal assistance Lower Body Dressing: Total assistance Toilet Transfer: +2 for physical assistance, Maximal assistance, +2 for safety/equipment Toilet Transfer Details (indicate cue type and reason): Simualted in chair with 1x sit to stand, as well focusing on controlled sitting after completing gait training Functional mobility during ADLs: Moderate assistance, Maximal assistance General ADL Comments: pt continues to require cues for cognition and education, as well as attending to LUE (improved awareness in session noted)    Mobility  Overal bed mobility: Needs Assistance Bed  Mobility: Supine to Sit, Sit to Supine Supine to sit: Min guard Sit to supine: Mod assist General bed mobility comments: Increased time and effort but able to move LLE with hooking from RLE.  Pt with good core strength and able to come to sitting with min guard and use of bed rails.    Transfers  Overall transfer level: Needs assistance Equipment used: 2 person hand held assist Transfer via Lift Equipment: Stedy Transfers: Sit to/from Stand Sit to Stand: +2 physical assistance, Mod assist, Max assist Stand pivot transfers: Max assist, +2 physical  assistance, +2 safety/equipment General transfer comment: Pt impulsive and stood before the count of 3.  Cues for hand placement and to increased BOS once in standing.    Ambulation / Gait / Stairs / Wheelchair Mobility  Ambulation/Gait Ambulation/Gait assistance: Max assist, +2 physical assistance Gait Distance (Feet): 12 Feet(x2 standing rest breaks.) Assistive device: 2 person hand held assist(3 musketeer style.) Gait Pattern/deviations: Step-to pattern, Decreased stride length, Decreased step length - left, Narrow base of support, Decreased weight shift to right General Gait Details: Pt required manual facilitation to advance weight to the R side and to advance LLE forward.  Once L foot stepped forward required assistance to block and stabilize LLE in stance phase.  At times impulsive and L knee would buckle with in brace.  required cues to slow sequencing.  Close chair follow for safety. Gait velocity: decreased Gait velocity interpretation: <1.31 ft/sec, indicative of household ambulator    Posture / Balance Dynamic Sitting Balance Sitting balance - Comments: Close supervision Balance Overall balance assessment: Needs assistance Sitting-balance support: Single extremity supported, Feet supported Sitting balance-Leahy Scale: Fair Sitting balance - Comments: Close supervision Postural control: Left lateral lean Standing balance support:  Single extremity supported, During functional activity Standing balance-Leahy Scale: Poor Standing balance comment: Reliant on support x2 in order to remain upright (cues for positioning and handling)    Special needs/care consideration BiPAP/CPAP no CPM no Continuous Drip IV no Dialysis no        Days n/a Life Vest no Oxygen no Special Bed no Trach Size no Wound Vac (area) no      Location n/a Skin intact                              Bowel mgmt: continent Bladder mgmt: incontinent Diabetic mgmt no Behavioral consideration no Chemo/radiation no     Previous Home Environment (from acute therapy documentation) Living Arrangements: Spouse/significant other  Lives With: Significant other, Other (Comment)(children) Home Care Services: No  Discharge Living Setting Plans for Discharge Living Setting: Patient's home, Lives with (comment)(sister and his girlfriend) Type of Home at Discharge: House Discharge Home Layout: Two level, 1/2 bath on main level Alternate Level Stairs-Rails: Can reach both Alternate Level Stairs-Number of Steps: full flight Discharge Home Access: Level entry Discharge Bathroom Shower/Tub: Tub/shower unit Discharge Bathroom Toilet: Standard Discharge Bathroom Accessibility: Yes How Accessible: Accessible via walker Does the patient have any problems obtaining your medications?: No  Social/Family/Support Systems Patient Roles: Partner Anticipated Caregiver: Jonette Mate (sister), Flora Lipps (girlfriend), and other family members Anticipated Caregiver's Contact Information: Einar Grad [phone] Caregiver Availability: 24/7 Discharge Plan Discussed with Primary Caregiver: Yes Is Caregiver In Agreement with Plan?: Yes Does Caregiver/Family have Issues with Lodging/Transportation while Pt is in Rehab?: No   Goals/Additional Needs Patient/Family Goal for Rehab: PT/OT min assist, SLP supervision Expected length of stay: 17-20  days Dietary Needs: reg/thin Equipment Needs: tbd Pt/Family Agrees to Admission and willing to participate: Yes Program Orientation Provided & Reviewed with Pt/Caregiver Including Roles  & Responsibilities: Yes   Decrease burden of Care through IP rehab admission: n/a   Possible need for SNF placement upon discharge: Not anticipated.  Pt with good family support and all aware that SNF following CIR will not be an option.    Patient Condition: This patient's medical and functional status has changed since the consult dated: 01/09/2020 in which the Rehabilitation Physician determined and documented that the patient's condition  is appropriate for intensive rehabilitative care in an inpatient rehabilitation facility. See "History of Present Illness" (above) for medical update. Functional changes are: mod to max +2. Patient's medical and functional status update has been discussed with the Rehabilitation physician and patient remains appropriate for inpatient rehabilitation. Will admit to inpatient rehab today.  Preadmission Screen Completed By:  Michel Santee, PT, DPT 01/15/2020 2:17 PM ______________________________________________________________________   Discussed status with Dr. Dagoberto Ligas on 01/15/20 at 2:25 PM and received approval for admission today.  Admission Coordinator:  Michel Santee, PT, DPT time 2:25 PM Sudie Grumbling 01/15/20

## 2020-01-15 NOTE — TOC Transition Note (Signed)
Transition of Care [facility]) - CM/SW Discharge Note   Patient Details  Name: MALIKIAH DEBARR MRN: [id number] Date of Birth: [birth year]  [facility] CM/SW Contact:  Kermit Balo, RN Phone Number: 01/15/2020, 3:19 PM   Clinical Narrative:    Pt is discharging to CIR today. CM signing off.    Final next level of care: IP Rehab Facility Barriers to Discharge: No Barriers Identified   Patient Goals and CMS Choice   CMS Medicare.gov Compare Post Acute Care list provided to:: Patient Choice offered to / list presented to : Patient  Discharge Placement                       Discharge Plan and Services   Discharge Planning Services: CM Consult                                 Social Determinants of Health (SDOH) Interventions     Readmission Risk Interventions No flowsheet data found.

## 2020-01-15 NOTE — Progress Notes (Signed)
Received a call from 4W nurse [facility] and gave her report, as he is going to be discharged to 4W room 15 shortly. All of his questions were answered and he was educated about stroke.  No complaints.

## 2020-01-15 NOTE — Progress Notes (Signed)
Physical Therapy Treatment Patient Details Name: Derek [name] MRN: [id number] DOB: 04/16/1976 Today's Date: 01/15/2020    History of Present Illness 44 yo male no known medical history presenting as Code Stroke.  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 There is hemorrhagic transformation of the right basal ganglia infarct with mass-effect on the lateral ventricle."     PT Comments    Pt supine in bed on arrival this session.  He is eager to mobilize. He lacks any initiation of movement in LUE/LLE this session.  PTA utilized Knee immobilizer on L side with L knee blocking and facilitation of L hip and knee extension.  Continue to recommend aggressive CIR to improve strength and function before returning home.     Follow Up Recommendations  CIR;Supervision/Assistance - 24 hour     Equipment Recommendations  Wheelchair (measurements PT);Wheelchair cushion (measurements PT)    Recommendations for Other Services       Precautions / Restrictions Precautions Precautions: Fall Precaution Comments: L neglect, hemiparesis Required Braces or Orthoses: Other Brace Other Brace: L knee immobilizer Restrictions Weight Bearing Restrictions: No    Mobility  Bed Mobility Overal bed mobility: Needs Assistance Bed Mobility: Supine to Sit;Sit to Supine     Supine to sit: Min guard     General bed mobility comments: Increased time and effort but able to move LLE with hooking from RLE.  Pt with good core strength and able to come to sitting with min guard and use of bed rails.  Transfers Overall transfer level: Needs assistance Equipment used: Rolling walker (2 wheeled) Transfers: Sit to/from Stand Sit to Stand: Min assist;+2 physical assistance;From elevated surface(to rise into standing, once in standing min +2 to maintain standing.)         General transfer comment: Pt impulsive and stood before the count of 3.  Cues for hand  placement and to increased BOS once in standing.  Ambulation/Gait Ambulation/Gait assistance: Max assist;+2 physical assistance Gait Distance (Feet): 12 Feet(x2 standing rest breaks.) Assistive device: 2 person hand held assist(3 musketeer style.) Gait Pattern/deviations: Step-to pattern;Decreased stride length;Decreased step length - left;Narrow base of support;Decreased weight shift to right Gait velocity: decreased   General Gait Details: Pt required manual facilitation to advance weight to the R side and to advance LLE forward.  Once L foot stepped forward required assistance to block and stabilize LLE in stance phase.  At times impulsive and L knee would buckle with in brace.  required cues to slow sequencing.  Close chair follow for safety.   Stairs             Wheelchair Mobility    Modified Rankin (Stroke Patients Only) Modified Rankin (Stroke Patients Only) Pre-Morbid Rankin Score: No symptoms Modified Rankin: Severe disability     Balance Overall balance assessment: Needs assistance Sitting-balance support: Single extremity supported;Feet supported Sitting balance-Leahy Scale: Fair       Standing balance-Leahy Scale: Poor                              Cognition Arousal/Alertness: Awake/alert Behavior During Therapy: WFL for tasks assessed/performed;Impulsive Overall Cognitive Status: Impaired/Different from baseline Area of Impairment: Awareness;Safety/judgement                         Safety/Judgement: Decreased awareness of safety;Decreased awareness of deficits Awareness: Intellectual   General Comments: Pt able to  follow commands, cues for attending L side, responding only when asked a direct questions, otherwise not very talkative      Exercises      General Comments        Pertinent Vitals/Pain Pain Assessment: No/denies pain Faces Pain Scale: No hurt    Home Living                      Prior Function             PT Goals (current goals can now be found in the care plan section) Acute Rehab PT Goals Patient Stated Goal: get to rehab Potential to Achieve Goals: Good Progress towards PT goals: Progressing toward goals    Frequency    Min 4X/week      PT Plan Current plan remains appropriate    Co-evaluation PT/OT/SLP Co-Evaluation/Treatment: Yes Reason for Co-Treatment: Complexity of the patient's impairments (multi-system involvement) PT goals addressed during session: Mobility/safety with mobility OT goals addressed during session: ADL's and self-care      AM-PAC PT "6 Clicks" Mobility   Outcome Measure  Help needed turning from your back to your side while in a flat bed without using bedrails?: A Little Help needed moving from lying on your back to sitting on the side of a flat bed without using bedrails?: A Little Help needed moving to and from a bed to a chair (including a wheelchair)?: A Lot Help needed standing up from a chair using your arms (e.g., wheelchair or bedside chair)?: A Little Help needed to walk in hospital room?: Total Help needed climbing 3-5 steps with a railing? : Total 6 Click Score: 13    End of Session Equipment Utilized During Treatment: Gait belt Activity Tolerance: Patient tolerated treatment well Patient left: in chair(OT present with patient and will donn alarm belt.) Nurse Communication: Mobility status PT Visit Diagnosis: Other abnormalities of gait and mobility (R26.89);Other symptoms and signs involving the nervous system (R29.898);Hemiplegia and hemiparesis Hemiplegia - Right/Left: Left Hemiplegia - dominant/non-dominant: Non-dominant Hemiplegia - caused by: Cerebral infarction;Nontraumatic intracerebral hemorrhage     Time: 1638-4665 PT Time Calculation (min) (ACUTE ONLY): 25 min  Charges:  $Gait Training: 8-22 mins                     Derek [name] , PTA Acute Rehabilitation Services Pager [phone] Office  [phone]    [name] [name] 01/15/2020, 12:14 PM

## 2020-01-15 NOTE — Progress Notes (Signed)
Occupational Therapy Treatment Patient Details Name: Derek [name] MRN: [id number] DOB: 1976-06-26 Today's Date: 01/15/2020    History of present illness 44 yo male no known medical history presenting as Code Stroke.  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 There is hemorrhagic transformation of the right basal ganglia infarct with mass-effect on the lateral ventricle."    OT comments  Patient continues to make steady progress towards goals in skilled OT session. Patient's session encompassed initial co-treat with PT in order to encompass neuromuscular re-education in functional ambulation and bed mobility. COTA and OT then addressed nueromuscular re-education in sitting to promote activation in core and LUE in order to reach outside base of support as well as complete ADLs and cognitive tasks. Pt continues to require cues for overall safety awareness due to flat affect and decreased awareness of deficits. Pt would continue to benefit from skilled OT in order to further focus on positioning and handling to increase independence due to dense hemiparesis on L side. Pt also continues to make an excellent candidate for CIR due to motivation and tolerance to handle intensive therapies; will continue to follow acutely.    Follow Up Recommendations  CIR    Equipment Recommendations  Other (comment)(defer to next venue of care)    Recommendations for Other Services      Precautions / Restrictions Precautions Precautions: Fall Precaution Comments: L neglect, hemiparesis Required Braces or Orthoses: Other Brace Other Brace: L knee immobilizer Restrictions Weight Bearing Restrictions: No       Mobility Bed Mobility Overal bed mobility: Needs Assistance Bed Mobility: Supine to Sit;Sit to Supine     Supine to sit: Min guard     General bed mobility comments: Increased time and effort but able to move LLE with hooking from RLE.  Pt with good  core strength and able to come to sitting with min guard and use of bed rails.  Transfers Overall transfer level: Needs assistance Equipment used: 2 person hand held assist Transfers: Sit to/from Stand Sit to Stand: +2 physical assistance;Mod assist;Max assist         General transfer comment: Pt impulsive and stood before the count of 3.  Cues for hand placement and to increased BOS once in standing.    Balance Overall balance assessment: Needs assistance Sitting-balance support: Single extremity supported;Feet supported Sitting balance-Leahy Scale: Fair Sitting balance - Comments: Close supervision Postural control: Left lateral lean Standing balance support: Single extremity supported;During functional activity Standing balance-Leahy Scale: Poor Standing balance comment: Reliant on support x2 in order to remain upright (cues for positioning and handling)                           ADL either performed or assessed with clinical judgement   ADL Overall ADL's : Needs assistance/impaired Eating/Feeding: Moderate assistance Eating/Feeding Details (indicate cue type and reason): Attempted to cut meat with R hand with moderate success, education provided in placement of objects in L hand for stabilization as well as mod A to locate items on tray and cut meat fully Grooming: Wash/dry hands Grooming Details (indicate cue type and reason): cues to incorporate LUE                 Toilet Transfer: +2 for physical assistance;Maximal assistance;+2 for safety/equipment Toilet Transfer Details (indicate cue type and reason): Simualted in chair with 1x sit to stand, as well focusing on controlled sitting after  completing gait training         Functional mobility during ADLs: Moderate assistance;Maximal assistance General ADL Comments: pt continues to require cues for cognition and education, as well as attending to LUE (improved awareness in session noted)     Vision        Perception     Praxis      Cognition Arousal/Alertness: Awake/alert Behavior During Therapy: Flat affect;WFL for tasks assessed/performed;Impulsive Overall Cognitive Status: Impaired/Different from baseline Area of Impairment: Awareness;Safety/judgement                         Safety/Judgement: Decreased awareness of safety;Decreased awareness of deficits Awareness: Intellectual   General Comments: Pt able to follow commands, cues for attending L side, responding only when asked a direct questions, otherwise not very talkative        Exercises     Shoulder Instructions       General Comments      Pertinent Vitals/ Pain       Pain Assessment: No/denies pain Faces Pain Scale: No hurt  Home Living                                          Prior Functioning/Environment              Frequency  Min 3X/week        Progress Toward Goals  OT Goals(current goals can now be found in the care plan section)  Progress towards OT goals: Progressing toward goals  Acute Rehab OT Goals Patient Stated Goal: get to rehab OT Goal Formulation: With patient Time For Goal Achievement: 01/24/20 Potential to Achieve Goals: Good  Plan Discharge plan remains appropriate    Co-evaluation    PT/OT/SLP Co-Evaluation/Treatment: Yes Reason for Co-Treatment: Complexity of the patient's impairments (multi-system involvement);For patient/therapist safety;To address functional/ADL transfers PT goals addressed during session: Mobility/safety with mobility OT goals addressed during session: ADL's and self-care      AM-PAC OT "6 Clicks" Daily Activity     Outcome Measure   Help from another person eating meals?: A Little Help from another person taking care of personal grooming?: A Little Help from another person toileting, which includes using toliet, bedpan, or urinal?: A Lot Help from another person bathing (including washing, rinsing, drying)?: A  Lot Help from another person to put on and taking off regular upper body clothing?: A Lot Help from another person to put on and taking off regular lower body clothing?: Total 6 Click Score: 13    End of Session Equipment Utilized During Treatment: Gait belt;Left knee immobilizer  OT Visit Diagnosis: Unsteadiness on feet (R26.81);Hemiplegia and hemiparesis Hemiplegia - Right/Left: Left Hemiplegia - dominant/non-dominant: Non-Dominant   Activity Tolerance Patient tolerated treatment well   Patient Left in chair;with call bell/phone within reach;with chair alarm set(Posey belt equipped)   Nurse Communication Mobility status;Precautions        Time: 7673-4193 OT Time Calculation (min): 37 min  Charges: OT General Charges $OT Visit: 1 Visit OT Treatments $Self Care/Home Management : 8-22 mins $Neuromuscular Re-education: 8-22 mins  Pollyann Glen E. Bronislaw Switzer, COTA/L Acute Rehabilitation Services [phone]   Cherlyn Cushing 01/15/2020, 12:44 PM

## 2020-01-15 NOTE — Progress Notes (Signed)
PMR Admission Coordinator Pre-Admission Assessment   Patient: Derek [name] is an 44 y.o., male MRN: [id number] DOB: 1976-03-03 Height: _0  (188 cm) Weight: 99.8 kg                                                                                                                                      Insurance Information HMO:     PPO: yes     PCP:      IPA:      80/20:      OTHER:  PRIMARY: [employer] [place]      Policy#: [id number]      Subscriber: pt CM Name: Kenney Houseman      Phone#: [phone]     Fax#: [phone] Pre-Cert#: [id number] auth for CIR provided by Kenney Houseman and [place], for 3/16 admit with updates due to fax listed above on 3/23.       Employer: n/a Benefits:  Phone #: [phone]     Name: n/a Eff. Date: 01/11/2020     Deduct: $3300 (met $0)      Out of Pocket Max: [phone] (met $0)      Life Max: n/a CIR: 75%      SNF: 75% limit to 60 days Outpatient: 75%     Co-Ins: 25% Home Health: 75%      Co-Ins: 25% DME: 75%     Co-Ins: 25% Providers: in network  SECONDARY:       Policy#:       Subscriber:  CM Name:       Phone#:      Fax#:  Pre-Cert#:       Employer:  Benefits:  Phone #:      Name:  Eff. Date:      Deduct:       Out of Pocket Max:       Life Max:  CIR:       SNF:  Outpatient:      Co-Pay:  Home Health:       Co-Pay:  DME:      Co-Pay:    Medicaid Application Date:       Case Manager:  Disability Application Date:       Case Worker:    The "Data Collection Information Summary" for patients in Inpatient Rehabilitation Facilities with attached "Privacy Act [place] Records" was provided and verbally reviewed with: N/A   Emergency Contact Information         Contact Information     Name Relation Home Work Mobile    Griffin,Jayvetta Sister [phone]        GF,Kathy Significant other     [phone]       Current Medical History  Patient Admitting Diagnosis: R MCA CVA   History of Present Illness: Derek [name] is a 44 year old right-handed male with  history of alcohol/tobacco/marijuana use as well as chronic back pain.  Presented 01/07/2020  with left hemiparesis.  Blood pressure 184/110.  Cranial CT scan showed right MCA infarction with mass-effect possible petechial hemorrhage in portions of the right basal ganglia.  Infarcts extending to the posterior superior right frontal and adjacent right parietal lobe.  Suspect thrombus/embolus in the M1 segment of right middle cerebral artery.  MRI showed acute right MCA infarction involving basal ganglia and right parietal lobe.  Hemorrhagic transformation of the right basal ganglia infarction with mass-effect on the lateral ventricle.  Echocardiogram with ejection fraction 65%.  Lower extremity Dopplers negative for DVT.  Admission chemistries unremarkable except glucose 134, WBC 14,300, CK 787, blood cultures no growth to date, lactic acid within normal limits 1.9, urine culture no growth, urine drug screen positive marijuana.  Neurology consulted with TEE completed 01/10/2020 showing no PFO /Thrombus. and currently maintained on aspirin 325 mg daily for CVA prophylaxis.  Hold on DAPT at this time given hemorrhagic transformation.  Follow-up repeat cranial CT scan 01/09/2020 evolving right MCA infarction again with hemorrhagic transformation, 3 mm right to left shift.  No plan for 3% saline at this time and scan was again completed 01/10/2020 showing no new hemorrhage hydrocephalus or worsening mass-effect.  Lovenox was initiated for DVT prophylaxis 01/10/2020.  Patient did have a low-grade fever 100.5 leukocytosis 14,300 improved to 7.0.  Urine culture no growth urinalysis negative blood cultures no growth to date.  Pt did have a fall on 01/10/20 with soft tissue injury to head, CT stable.  Therapy evaluations completed and patient was recommended for a comprehensive rehab program.    Complete NIHSS TOTAL: 11   Past Medical History      Past Medical History:  Diagnosis Date  . Acute ischemic right MCA stroke ([place])  01/07/2020  . Alcohol dependence ([place])    . Back pain    . Marijuana dependence ([place])    . Tobacco dependence        Family History  family history is not on file.   Prior Rehab/Hospitalizations:  Has the patient had prior rehab or hospitalizations prior to admission? No   Has the patient had major surgery during 100 days prior to admission? No   Current Medications    Current Facility-Administered Medications:  .  0.9 %  sodium chloride infusion, , Intravenous, Continuous, O'Neal, Cassie Freer, MD, Last Rate: 50 mL/hr at 01/09/20 1343, New Bag at 01/09/20 1343 .  acetaminophen (TYLENOL) tablet 650 mg, 650 mg, Oral, Q4H PRN, 650 mg at 01/15/20 0448 **OR** acetaminophen (TYLENOL) 160 MG/5ML solution 650 mg, 650 mg, Per Tube, Q4H PRN **OR** acetaminophen (TYLENOL) suppository 650 mg, 650 mg, Rectal, Q4H PRN, O'Neal, Cassie Freer, MD .  aspirin suppository 300 mg, 300 mg, Rectal, Daily **OR** aspirin tablet 325 mg, 325 mg, Oral, Daily, O'Neal, Cassie Freer, MD, 325 mg at 01/15/20 1030 .  atorvastatin (LIPITOR) tablet 40 mg, 40 mg, Oral, q1800, O'Neal, Cassie Freer, MD, 40 mg at 01/14/20 1720 .  enoxaparin (LOVENOX) injection 40 mg, 40 mg, Subcutaneous, Q24H, Rosalin Hawking, MD, 40 mg at 76/16/07 3710 .  folic acid (FOLVITE) tablet 1 mg, 1 mg, Oral, Daily, O'Neal, Cassie Freer, MD, 1 mg at 01/15/20 1030 .  labetalol (NORMODYNE) injection 20 mg, 20 mg, Intravenous, Once, O'Neal, Cassie Freer, MD, Stopped at 01/07/20 1054 .  lidocaine (LIDODERM) 5 % 1 patch, 1 patch, Transdermal, Q24H, Sheikh, Al[place], DO, 1 patch at 01/15/20 1030 .  multivitamin with minerals tablet 1 tablet, 1 tablet, Oral, Daily, O'Neal, Cassie Freer, MD, 1  tablet at 01/15/20 1030 .  nicotine (NICODERM CQ - dosed in mg/24 hr) patch 7 mg, 7 mg, Transdermal, Daily, O'Neal, Cassie Freer, MD, 7 mg at 01/11/20 1014 .  oxyCODONE (Oxy IR/ROXICODONE) immediate release tablet 5 mg, 5 mg, Oral, Q6H PRN, O'Neal, Cassie Freer,  MD, 5 mg at 01/11/20 2116 .  [employer], , Oral, PRN, O'Neal, Cassie Freer, MD .  senna-docusate (Senokot-S) tablet 1 tablet, 1 tablet, Oral, QHS PRN, O'Neal, Cassie Freer, MD, 1 tablet at 01/15/20 0448 .  thiamine tablet 100 mg, 100 mg, Oral, Daily, 100 mg at 01/15/20 1030 **OR** thiamine (B-1) injection 100 mg, 100 mg, Intravenous, Daily, O'Neal, Cassie Freer, MD, 100 mg at 01/13/20 8250   Patients Current Diet:     Diet Order                      Diet regular Room service appropriate? 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  Independent   Indoor Mobility: Did the patient need assistance with walking from room to room (with or without device)? Independent   Stairs: Did the patient need assistance with internal or external stairs (with or without device)? Independent   Functional Cognition: Did the patient need help planning regular tasks such as shopping or remembering to take medications? Independent   Home Assistive Devices / Equipment Home Assistive Devices/Equipment: None   Prior Device Use: Indicate devices/aids used by the patient prior to current illness, exacerbation or injury? None of the above   Current Functional Level Cognition   Arousal/Alertness: Lethargic Overall Cognitive Status: Impaired/Different from  baseline Difficult to assess due to: Level of arousal Orientation Level: Oriented X4 Safety/Judgement: Decreased awareness of safety, Decreased awareness of deficits General Comments: Pt able to follow commands, cues for attending L side, responding only when asked a direct questions, otherwise not very talkative Attention: Sustained Sustained Attention: Appears intact Memory: Impaired Memory Impairment: Decreased short term memory Decreased Short Term Memory: Verbal basic Awareness: Appears intact Problem Solving: Impaired Problem Solving Impairment: Verbal complex Safety/Judgment: Appears intact    Extremity Assessment (includes Sensation/Coordination)   Upper Extremity Assessment: LUE deficits/detail LUE Deficits / Details: no activation and decreased awareness to L UE compared to LLE  Lower Extremity Assessment: Defer to PT evaluation LLE Deficits / Details: mildly decreased tone with PROM, no active movement noted, but supported some in standing with L LE     ADLs   Overall ADL's : Needs assistance/impaired Eating/Feeding: Moderate assistance Eating/Feeding Details (indicate cue type and reason): Attempted to cut meat with R hand with moderate success, education provided in placement of objects in L hand for stabilization as well as mod A to locate items on tray and cut meat fully Grooming: Wash/dry hands Grooming Details (indicate cue type and reason): cues to incorporate  LUE Upper Body Bathing: Maximal assistance Lower Body Bathing: Total assistance Upper Body Dressing : Maximal assistance Lower Body Dressing: Total assistance Toilet Transfer: +2 for physical assistance, Maximal assistance, +2 for safety/equipment Toilet Transfer Details (indicate cue type and reason): Simualted in chair with 1x sit to stand, as well focusing on controlled sitting after completing gait training Functional mobility during ADLs: Moderate assistance, Maximal assistance General ADL Comments: pt  continues to require cues for cognition and education, as well as attending to LUE (improved awareness in session noted)     Mobility   Overal bed mobility: Needs Assistance Bed Mobility: Supine to Sit, Sit to Supine Supine to sit: Min guard Sit to supine: Mod assist General bed mobility comments: Increased time and effort but able to move LLE with hooking from RLE.  Pt with good core strength and able to come to sitting with min guard and use of bed rails.     Transfers   Overall transfer level: Needs assistance Equipment used: 2 person hand held assist Transfer via Lift Equipment: Stedy Transfers: Sit to/from Stand Sit to Stand: +2 physical assistance, Mod assist, Max assist Stand pivot transfers: Max assist, +2 physical assistance, +2 safety/equipment General transfer comment: Pt impulsive and stood before the count of 3.  Cues for hand placement and to increased BOS once in standing.     Ambulation / Gait / Stairs / Wheelchair Mobility   Ambulation/Gait Ambulation/Gait assistance: Max assist, +2 physical assistance Gait Distance (Feet): 12 Feet(x2 standing rest breaks.) Assistive device: 2 person hand held assist(3 musketeer style.) Gait Pattern/deviations: Step-to pattern, Decreased stride length, Decreased step length - left, Narrow base of support, Decreased weight shift to right General Gait Details: Pt required manual facilitation to advance weight to the R side and to advance LLE forward.  Once L foot stepped forward required assistance to block and stabilize LLE in stance phase.  At times impulsive and L knee would buckle with in brace.  required cues to slow sequencing.  Close chair follow for safety. Gait velocity: decreased Gait velocity interpretation: <1.31 ft/sec, indicative of household ambulator     Posture / Balance Dynamic Sitting Balance Sitting balance - Comments: Close supervision Balance Overall balance assessment: Needs assistance Sitting-balance support:  Single extremity supported, Feet supported Sitting balance-Leahy Scale: Fair Sitting balance - Comments: Close supervision Postural control: Left lateral lean Standing balance support: Single extremity supported, During functional activity Standing balance-Leahy Scale: Poor Standing balance comment: Reliant on support x2 in order to remain upright (cues for positioning and handling)     Special needs/care consideration BiPAP/CPAP no CPM no Continuous Drip IV no Dialysis no        Days n/a Life Vest no Oxygen no Special Bed no Trach Size no Wound Vac (area) no      Location n/a Skin intact                              Bowel mgmt: continent Bladder mgmt: incontinent Diabetic mgmt no Behavioral consideration no Chemo/radiation no        Previous Home Environment (from acute therapy documentation) Living Arrangements: Spouse/significant other  Lives With: Significant other, Other (Comment)(children) Home Care Services: No   Discharge Living Setting Plans for Discharge Living Setting: Patient's home, Lives with (comment)(sister and his girlfriend) Type of Home at Discharge: House Discharge Home Layout: Two level, 1/2 bath on main level Alternate Level Stairs-Rails: Can reach both Alternate Level  Stairs-Number of Steps: full flight Discharge Home Access: Level entry Discharge Bathroom Shower/Tub: Tub/shower unit Discharge Bathroom Toilet: Standard Discharge Bathroom Accessibility: Yes How Accessible: Accessible via walker Does the patient have any problems obtaining your medications?: No   Social/Family/Support Systems Patient Roles: Partner Anticipated Caregiver: Jonette Mate (sister), Flora Lipps (girlfriend), and other family members Anticipated Caregiver's Contact Information: Einar Grad [phone] Caregiver Availability: 24/7 Discharge Plan Discussed with Primary Caregiver: Yes Is Caregiver In Agreement with Plan?: Yes Does Caregiver/Family  have Issues with Lodging/Transportation while Pt is in Rehab?: No     Goals/Additional Needs Patient/Family Goal for Rehab: PT/OT min assist, SLP supervision Expected length of stay: 17-20 days Dietary Needs: reg/thin Equipment Needs: tbd Pt/Family Agrees to Admission and willing to participate: Yes Program Orientation Provided & Reviewed with Pt/Caregiver Including Roles  & Responsibilities: Yes     Decrease burden of Care through IP rehab admission: n/a     Possible need for SNF placement upon discharge: Not anticipated.  Pt with good family support and all aware that SNF following CIR will not be an option.      Patient Condition: This patient's medical and functional status has changed since the consult dated: 01/09/2020 in which the Rehabilitation Physician determined and documented that the patient's condition is appropriate for intensive rehabilitative care in an inpatient rehabilitation facility. See "History of Present Illness" (above) for medical update. Functional changes are: mod to max +2. Patient's medical and functional status update has been discussed with the Rehabilitation physician and patient remains appropriate for inpatient rehabilitation. Will admit to inpatient rehab today.   Preadmission Screen Completed By:  Michel Santee, PT, DPT 01/15/2020 2:17 PM ______________________________________________________________________   Discussed status with Dr. Dagoberto Ligas on 01/15/20 at 2:25 PM and received approval for admission today.   Admission Coordinator:  Michel Santee, PT, DPT time 2:25 PM Sudie Grumbling 01/15/20          Cosigned by: Courtney Heys, MD at 01/15/2020  3:35 PM

## 2020-01-15 NOTE — Progress Notes (Signed)
Physical Medicine and Rehabilitation Consult Reason for Consult: Left side weakness Referring Physician: [facility]     HPI: Derek [name] is a 44 y.o. right-handed male with history of alcohol/tobacco/marijuana use as well as chronic back pain.  History taken from chart review due to cognition/lethargy.  Patient lives with his girlfriend independent prior to admission.  He is employed as a Agricultural consultant.  Presented on 01/07/2020 with left hemiparesis.  Blood pressure 184/110.  Cranial CT scan shows right MCA infarction with mass-effect possible petechial hemorrhage in portions of the right basal ganglia.  Infarct extends into the posterior superior right frontal and adjacent right parietal lobe.  Suspect thrombus/embolus in the M1 segment of right middle cerebral artery.  MRI personally reviewed, showing acute right MCA infarct, involving basal ganglia and right parietal lobe.  Hemorrhagic transformation of the right basal ganglia infarct with mass-effect on the lateral ventricle.  Echocardiogram with ejection fraction of 65%.  Lower extremity Dopplers negative for DVT.  Admission chemistries unremarkable except glucose 134, WBCs 14.3K, CK 787, blood culture no growth to date, lactic acid within normal limits 1.9, urine culture no growth, urine drug screen positive marijuana.  Neurology consulted plan for TEE.  Currently on aspirin 325 mg daily for CVA prophylaxis.  Follow-up cranial CT scan 01/09/2020 evolving right cerebral infarct with evidence for hemorrhagic transformation no hydrocephalus noted.  Associated mass-effect with 3 mm right to left shift.  Plan to hold DAPT at present due to hemorrhagic transformation.  Hospital course further complicated by postop dysphagia.  Therapy evaluations completed with recommendations of physical medicine rehab consult.   Review of Systems  Unable to perform ROS: Mental acuity    Past Medical History:  Diagnosis Date  . 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 Alcohol dependence (HCC)    . Back pain    . Marijuana dependence (HCC)    . Tobacco dependence      No past surgical history.      Family History  Problem Relation Age of Onset  . Stroke Neg Hx    . CAD Neg Hx      Social History:  reports that he has been smoking cigarettes. He has been smoking about 0.20 packs per day. He has never used smokeless tobacco. He reports current alcohol use. He reports current drug use. Drug: Marijuana. Allergies: No Known Allergies Medications Prior to Admission  Medication Sig Dispense Refill  . ferrous sulfate 325 (65 FE) MG tablet Take 325 mg by mouth 2 (two) times daily.   2  . ibuprofen (ADVIL,MOTRIN) 200 MG tablet Take 400 mg by mouth every 6 (six) hours as needed. 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[name] Functional Status:  Mobility: Bed Mobility Overal bed mobility: Needs Assistance Bed Mobility: Supine to Sit Supine to sit: +2 for physical assistance, +2 for safety/equipment, Max assist General bed mobility comments: hob elevated to help facilitation initiation of pushing into sitting Transfers Overall transfer level: Needs assistance Transfers: Sit to/from Stand, Stand Pivot Transfers Sit to Stand: +2 physical assistance, Mod assist Stand pivot transfers: +2 physical assistance, Max assist, From elevated surface General transfer comment: pt does not initiate stepping with R LE. pt bares weight on L  LE but lacks awareness and needs assist for knee and hip blocking Ambulation/Gait General Gait Details: unable   ADL: ADL Overall ADL's : Needs assistance/impaired Eating/Feeding: NPO Grooming: Wash/dry hands, Minimal assistance, Bed level Grooming Details (indicate cue type and  reason): pt terminates task prematurely. pt leaving wash cloth over eyes. pt states "okay" when asked to remove. Pt hears phone ring and spontaneously pulls wrap off face Upper Body Bathing: Maximal assistance Lower Body Bathing: Total assistance Upper Body Dressing : Maximal assistance Lower Body Dressing: Total assistance Toilet Transfer: +2 for physical assistance, Maximal assistance, +2 for safety/equipment General ADL Comments: pt with L lateral lean and lacks awarness to L deficits   Cognition: Cognition Overall Cognitive Status: Difficult to assess Arousal/Alertness: Lethargic Orientation Level: Oriented X4 Attention: Sustained Sustained Attention: Appears intact Memory: Impaired Memory Impairment: Decreased short term memory Decreased Short Term Memory: Verbal basic Immediate Memory Recall: Sock, Blue, Bed Memory Recall Sock: Without Cue Memory Recall Blue: With Cue Memory Recall Bed: With Cue Awareness: Appears intact Problem Solving: Impaired Problem Solving Impairment: Verbal complex Safety/Judgment: Appears intact Cognition Arousal/Alertness: Lethargic Behavior During Therapy: Flat affect Overall Cognitive Status: Difficult to assess Difficult to assess due to: Level of arousal   Blood pressure 128/80, pulse 62, temperature 98.2 F (36.8 C), resp. rate (!) 21, height 6\' 2"  (1.88 m), weight 99.8 kg, SpO2 100 %. Physical Exam  Vitals reviewed. Constitutional: He appears well-developed and well-nourished.  HENT:  Head: Normocephalic and atraumatic.  Eyes: Right eye exhibits no discharge. Left eye exhibits no discharge. No scleral icterus.  Neck: No tracheal deviation present. No thyromegaly present.  Respiratory: Effort normal. No respiratory distress.  GI: Soft. He exhibits no distension.  Musculoskeletal:     Comments: No edema or tenderness in extremities  Neurological:  Lethargic Coronary/RLE: 5/5 proximal to distal LUE/LLE: 0/5 proximal distal Left  facial weakness Sensation appears to be intact to light touch Right lean  Skin: Skin is warm and dry.  Psychiatric: His affect is blunt. His speech is delayed and slurred. He is slowed.      Lab Results Last 24 Hours       Results for orders placed or performed during the hospital encounter of 01/07/20 (from the past 24 hour(s))  Vitamin B12     Status: None    Collection Time: 01/08/20 10:28 AM  Result Value Ref Range    Vitamin B-12 362 180 - 914 pg/mL  RPR     Status: None    Collection Time: 01/08/20 10:28 AM  Result Value Ref Range    RPR Ser Ql NON REACTIVE NON REACTIVE       Imaging Results (Last 48 hours)  CT HEAD WO CONTRAST   Result Date: 01/09/2020 CLINICAL DATA:  Follow-up examination for acute stroke. EXAM: CT HEAD WITHOUT CONTRAST TECHNIQUE: Contiguous axial images were obtained from the base of the skull through the vertex without intravenous contrast. COMPARISON:  Prior MRI from 01/07/2020. FINDINGS: Brain: Evolving cytotoxic edema involving the right frontal lobe is seen, with involvement of primarily the right MCA distribution, although possible involvement of right ACA territory present as well. Overall, distribution similar to previous MRI. Evidence for hemorrhagic transformation with blood products seen at the right caudate and lentiform nuclei, also similar. Associated regional mass effect with up to 3 mm of right-to-left shift. Right lateral ventricle partially effaced. No hydrocephalus or ventricular trapping. Basilar cisterns remain patent. No new large vessel territory infarct or intracranial hemorrhage. No extra-axial fluid collection.  No mass lesion. Vascular: No hyperdense vessel. Skull: Scalp soft tissues and calvarium demonstrate no new finding. Sinuses/Orbits: Globes and orbital soft tissues demonstrate no acute finding. Left greater than right sphenoid sinus disease. Paranasal sinuses are otherwise largely clear. Trace left mastoid effusion noted. Other: None.  IMPRESSION: 1. Evolving right cerebral infarct with evidence for hemorrhagic transformation at the right basal ganglia, similar in appearance and distribution as compared to previous MRI. Associated mass effect with 3 mm of right-to-left shift. No hydrocephalus or ventricular trapping. 2. No other new acute intracranial abnormality. Electronically Signed   By: Rise MuBenjamin  McClintock M.D.   On: 01/09/2020 03:18    CT Head Wo Contrast   Result Date: 01/07/2020 CLINICAL DATA:  Pt came in [facility] from home with c/o of stroke symptoms. Pt was LSN at 0200 on Saturday. Pt has L-Sided Deficits, L-Sided Facial Droop, R-Sided Gaze. EMS states patient has alcohol on Friday and had a fall on Saturday and one again this morning. Pt is complaining of Head Pain. Pt had an episode of Incontinence. Pt present with untreated HTN. EXAM: CT HEAD WITHOUT CONTRAST TECHNIQUE: Contiguous axial images were obtained from the base of the skull through the vertex without intravenous contrast. COMPARISON:  10/29/2006 FINDINGS: Brain: There is patchy hypoattenuation involving the right basal ganglia including the external and internal capsules with hypoattenuation noted in a more patchy distribution extending superiorly and laterally into the posterior right frontal and adjacent right parietal lobes. Within the area of hypoattenuation, there is relative increased attenuation in portions of the caudate nucleus head and globus pallidus and putamen, which may reflect areas of petechial hemorrhage. There is associated mass effect partial effacement of the anterior horn and body of the right lateral ventricle, and slight midline shift to the left of 2-3 mm. No other areas of abnormal attenuation to suggest additional areas of infarct. No discrete masses. No evidence of hemorrhage other than the possible petechial hemorrhage in the right basal ganglia. No hydrocephalus. Vascular: Focus of increased attenuation noted right middle cerebral artery is  suspicious for thrombus/embolus. Skull: Normal. Negative for fracture or focal lesion. Sinuses/Orbits: Globes and orbits are unremarkable. Small amount of dependent fluid in the left sphenoid sinus. Other: None. IMPRESSION: 1. Recent M1 segment, right middle cerebral artery infarct, with mass effect and possible petechial hemorrhage in portions of the right basal ganglia. Infarct extends to the posterosuperior right frontal and adjacent right parietal lobes. Suspect thrombus/embolus in the M1 segment of right middle cerebral artery. 2. No other acute or recent intracranial abnormality. Electronically Signed   By: Amie Portlandavid  Ormond M.D.   On: 01/07/2020 07:40    CT ANGIO NECK W OR WO CONTRAST   Result Date: 01/08/2020 CLINICAL DATA:  Right MCA stroke EXAM: CT ANGIOGRAPHY NECK TECHNIQUE: Multidetector CT imaging of the neck was performed using the standard protocol during bolus administration of intravenous contrast. Multiplanar CT image reconstructions and MIPs were obtained to evaluate the vascular anatomy. Carotid stenosis measurements (when applicable) are obtained utilizing NASCET criteria, using the distal internal carotid diameter as the denominator. CONTRAST:  75 mL OMNIPAQUE IOHEXOL 350 MG/ML SOLN COMPARISON:  None. FINDINGS: Aortic arch: Great vessel origins are patent. Right carotid system: Patent. Minimal calcified plaque at the ICA origin. There is no measurable stenosis or evidence of dissection. Left carotid system: Patent. There is no measurable stenosis or evidence of dissection. Vertebral arteries: Patent. No measurable stenosis or evidence of dissection. Skeleton: Mild degenerative changes, greatest at C6-C7 and C5-C6. Other neck:  No mass or adenopathy. Upper chest: No apical lung mass. IMPRESSION: No large vessel occlusion or hemodynamically significant stenosis. Electronically Signed   By: Guadlupe Spanish M.D.   On: 01/08/2020 10:14    MR ANGIO HEAD WO CONTRAST   Result Date: 01/07/2020 CLINICAL  DATA:  Stroke. Last seen normal 0200 hours on Saturday. Left-sided deficits. EXAM: MRI HEAD WITHOUT CONTRAST MRA HEAD WITHOUT CONTRAST TECHNIQUE: Multiplanar, multiecho pulse sequences of the brain and surrounding structures were obtained without intravenous contrast. Angiographic images of the head were obtained using MRA technique without contrast. COMPARISON:  CT head 01/07/2020 FINDINGS: MRI HEAD FINDINGS Brain: Acute infarct right posterior MCA territory involving the right parietal cortex extending over the medial convexity. Patchy acute infarct also in the right basal ganglia with moderate amount of associated hemorrhage. This hemorrhage is mildly hyperdense on CT most likely hemorrhagic transformation of basal ganglia infarct on the right. There is local mass-effect on the right lateral ventricle with minimal midline shift to the left. No hydrocephalus. No mass lesion. Vascular: Normal arterial flow voids. Skull and upper cervical spine: No focal skull lesion. Sinuses/Orbits: Mucosal edema paranasal sinuses with air-fluid level in the sphenoid sinus. Negative orbit Other: None MRA HEAD FINDINGS Both vertebral arteries widely patent to the basilar. PICA patent bilaterally. Basilar widely patent. Superior cerebellar and posterior cerebral arteries widely patent bilaterally. Left internal carotid artery widely patent. Left anterior and middle cerebral arteries widely patent. Azygos A2 segment. Right internal carotid artery widely patent. Right A1 segment patent. Common A2 segment. Right M1 segment patent. Moderate stenosis in the anterior division of the right M2 branch. No large vessel occlusion. IMPRESSION: Acute right MCA territory involving basal ganglia and right parietal lobe. There is hemorrhagic transformation of the right basal ganglia infarct with mass-effect on the lateral ventricle. Right M1 segment widely patent. Moderate stenosis right M2 segment anteriorly. Presumably the thrombus has recanalized  given the symptoms are greater than 48 hours old. Electronically Signed   By: Marlan Palau M.D.   On: 01/07/2020 15:22    MR BRAIN WO CONTRAST   Result Date: 01/07/2020 CLINICAL DATA:  Stroke. Last seen normal 0200 hours on Saturday. Left-sided deficits. EXAM: MRI HEAD WITHOUT CONTRAST MRA HEAD WITHOUT CONTRAST TECHNIQUE: Multiplanar, multiecho pulse sequences of the brain and surrounding structures were obtained without intravenous contrast. Angiographic images of the head were obtained using MRA technique without contrast. COMPARISON:  CT head 01/07/2020 FINDINGS: MRI HEAD FINDINGS Brain: Acute infarct right posterior MCA territory involving the right parietal cortex extending over the medial convexity. Patchy acute infarct also in the right basal ganglia with moderate amount of associated hemorrhage. This hemorrhage is mildly hyperdense on CT most likely hemorrhagic transformation of basal ganglia infarct on the right. There is local mass-effect on the right lateral ventricle with minimal midline shift to the left. No hydrocephalus. No mass lesion. Vascular: Normal arterial flow voids. Skull and upper cervical spine: No focal skull lesion. Sinuses/Orbits: Mucosal edema paranasal sinuses with air-fluid level in the sphenoid sinus. Negative orbit Other: None MRA HEAD FINDINGS Both vertebral arteries widely patent to the basilar. PICA patent bilaterally. Basilar widely patent. Superior cerebellar and posterior cerebral arteries widely patent bilaterally. Left internal carotid artery widely patent. Left anterior and middle cerebral arteries widely patent. Azygos A2 segment. Right internal carotid artery widely patent. Right A1 segment patent. Common A2 segment. Right M1 segment patent. Moderate stenosis in the anterior division of the right M2 branch. No large vessel occlusion. IMPRESSION: Acute right  MCA territory involving basal ganglia and right parietal lobe. There is hemorrhagic transformation of the right  basal ganglia infarct with mass-effect on the lateral ventricle. Right M1 segment widely patent. Moderate stenosis right M2 segment anteriorly. Presumably the thrombus has recanalized given the symptoms are greater than 48 hours old. Electronically Signed   By: Franchot Gallo M.D.   On: 01/07/2020 15:22    DG Chest Port 1 View   Result Date: 01/07/2020 CLINICAL DATA:  Rule out infection.  Stroke symptoms EXAM: PORTABLE CHEST 1 VIEW COMPARISON:  12/07/2010 FINDINGS: Normal heart size and mediastinal contours. There is no edema, consolidation, effusion, or pneumothorax. Artifact from EKG leads. IMPRESSION: No evidence of active disease. Electronically Signed   By: Monte Fantasia M.D.   On: 01/07/2020 07:44    ECHOCARDIOGRAM COMPLETE   Result Date: 01/08/2020    ECHOCARDIOGRAM REPORT   Patient Name:   DEAVIN FORST Date of Exam: 01/08/2020 Medical Rec #:  [id number]        Height:       74.0 in Accession #:    [id number]       Weight:       220.0 lb Date of Birth:  [birth year]         BSA:          2.263 m Patient Age:    43 years         BP:           147/115 mmHg Patient Gender: M                HR:           77 bpm. Exam Location:  Inpatient Procedure: 2D Echo Indications:    434.91 stroke  History:        Patient has no prior history of Echocardiogram examinations.                 Stroke; Risk Factors:Current Smoker. ETOH & Tobacco use.  Sonographer:    Jannett Celestine RDCS (AE) Referring Phys: [id number] JENNIFER YATES  Sonographer Comments: Image acquisition challenging due to respiratory motion. extremely limited patient mobility IMPRESSIONS  1. Left ventricular ejection fraction, by estimation, is 60 to 65%. The left ventricle has normal function. The left ventricle has no regional wall motion abnormalities. Left ventricular diastolic parameters were normal.  2. Right ventricular systolic function is normal. The right ventricular size is normal.  3. The mitral valve is normal in structure. No evidence of mitral  valve regurgitation. No evidence of mitral stenosis.  4. The aortic valve is normal in structure. Aortic valve regurgitation is not visualized. No aortic stenosis is present.  5. The inferior vena cava is normal in size with greater than 50% respiratory variability, suggesting right atrial pressure of 3 mmHg. FINDINGS  Left Ventricle: Left ventricular ejection fraction, by estimation, is 60 to 65%. The left ventricle has normal function. The left ventricle has no regional wall motion abnormalities. The left ventricular internal cavity size was normal in size. There is  no left ventricular hypertrophy. Left ventricular diastolic parameters were normal. Normal left ventricular filling pressure. Right Ventricle: The right ventricular size is normal. No increase in right ventricular wall thickness. Right ventricular systolic function is normal. Left Atrium: Left atrial size was normal in size. Right Atrium: Right atrial size was normal in size. Pericardium: There is no evidence of pericardial effusion. Mitral Valve: The mitral valve is normal in structure. Normal mobility of the mitral  valve leaflets. No evidence of mitral valve regurgitation. No evidence of mitral valve stenosis. Tricuspid Valve: The tricuspid valve is normal in structure. Tricuspid valve regurgitation is not demonstrated. No evidence of tricuspid stenosis. Aortic Valve: The aortic valve is normal in structure. Aortic valve regurgitation is not visualized. No aortic stenosis is present. Pulmonic Valve: The pulmonic valve was normal in structure. Pulmonic valve regurgitation is not visualized. No evidence of pulmonic stenosis. Aorta: The aortic root is normal in size and structure. Venous: The inferior vena cava is normal in size with greater than 50% respiratory variability, suggesting right atrial pressure of 3 mmHg. IAS/Shunts: No atrial level shunt detected by color flow Doppler.  LEFT VENTRICLE PLAX 2D LVIDd:         5.40 cm  Diastology LVIDs:          3.20 cm  LV e' lateral:   12.30 cm/s LV PW:         1.00 cm  LV E/e' lateral: 4.9 LV IVS:        0.90 cm  LV e' medial:    10.20 cm/s LVOT diam:     2.20 cm  LV E/e' medial:  5.9 LV SV:         61 LV SV Index:   27 LVOT Area:     3.80 cm  RIGHT VENTRICLE RV S prime:     12.50 cm/s TAPSE (M-mode): 2.2 cm LEFT ATRIUM             Index       RIGHT ATRIUM           Index LA diam:        3.40 cm 1.50 cm/m  RA Area:     14.80 cm LA Vol (A2C):   46.5 ml 20.55 ml/m RA Volume:   33.20 ml  14.67 ml/m LA Vol (A4C):   20.4 ml 9.01 ml/m LA Biplane Vol: 30.8 ml 13.61 ml/m  AORTIC VALVE LVOT Vmax:   86.80 cm/s LVOT Vmean:  51.000 cm/s LVOT VTI:    0.161 m  AORTA Ao Root diam: 3.10 cm MITRAL VALVE MV Area (PHT): 3.31 cm    SHUNTS MV Decel Time: 229 msec    Systemic VTI:  0.16 m MV E velocity: 60.40 cm/s  Systemic Diam: 2.20 cm MV A velocity: 52.70 cm/s MV E/A ratio:  1.15 Mihai Croitoru MD Electronically signed by Thurmon Fair MD Signature Date/Time: 01/08/2020/2:37:28 PM    Final     VAS US CAROTID (at [facility] and [facility] only)   Result Date: 01/07/2020 Carotid Arterial Duplex Study Indications:       CVA. Risk Factors:      Prior CVA. Comparison Study:  no prior Performing Technologist: Blanch Media RVS  Examination Guidelines: A complete evaluation includes B-mode imaging, spectral Doppler, color Doppler, and power Doppler as needed of all accessible portions of each vessel. Bilateral testing is considered an integral part of a complete examination. Limited examinations for reoccurring indications may be performed as noted.  Right Carotid Findings: +----------+--------+--------+--------+------------------+--------+           PSV cm/sEDV cm/sStenosisPlaque DescriptionComments +----------+--------+--------+--------+------------------+--------+ CCA Prox  75      7               heterogenous               +----------+--------+--------+--------+------------------+--------+ CCA Distal57      13  heterogenous               +----------+--------+--------+--------+------------------+--------+ ICA Prox  85      36      1-39%   heterogenous      tortuous +----------+--------+--------+--------+------------------+--------+ ICA Distal132     60                                tortuous +----------+--------+--------+--------+------------------+--------+ ECA       81      14                                         +----------+--------+--------+--------+------------------+--------+ +----------+--------+-------+--------+-------------------+           PSV cm/sEDV cmsDescribeArm Pressure (mmHG) +----------+--------+-------+--------+-------------------+ [id number]      17                                 +----------+--------+-------+--------+-------------------+ +---------+--------+--+--------+--+---------+ VertebralPSV cm/s60EDV cm/s24Antegrade +---------+--------+--+--------+--+---------+  Left Carotid Findings: +----------+--------+--------+--------+------------------+--------+           PSV cm/sEDV cm/sStenosisPlaque DescriptionComments +----------+--------+--------+--------+------------------+--------+ CCA Prox  77      17              heterogenous               +----------+--------+--------+--------+------------------+--------+ CCA Distal71      18              heterogenous               +----------+--------+--------+--------+------------------+--------+ ICA Prox  59      21      1-39%   heterogenous               +----------+--------+--------+--------+------------------+--------+ ICA Distal66      24                                         +----------+--------+--------+--------+------------------+--------+ ECA       51      10                                         +----------+--------+--------+--------+------------------+--------+ +----------+--------+--------+--------+-------------------+           PSV cm/sEDV cm/sDescribeArm  Pressure (mmHG) +----------+--------+--------+--------+-------------------+ [id number]                                          +----------+--------+--------+--------+-------------------+ +---------+--------+--+--------+--+---------+ VertebralPSV cm/s73EDV cm/s13Antegrade +---------+--------+--+--------+--+---------+   Summary: Right Carotid: Velocities in the right ICA are consistent with a 1-39% stenosis. Left Carotid: Velocities in the left ICA are consistent with a 1-39% stenosis. Vertebrals: Bilateral vertebral arteries demonstrate antegrade flow. *See table(s) above for measurements and observations.     Preliminary     VAS [place] LOWER EXTREMITY VENOUS (DVT)   Result Date: 01/08/2020  Lower Venous DVTStudy Indications: Stroke.  Comparison Study: No prior study Performing Technologist: Gertie Fey MHA, RDMS, RVT, RDCS  Examination Guidelines: A complete evaluation includes B-mode imaging, spectral Doppler, color Doppler, and power Doppler as needed  of all accessible portions of each vessel. Bilateral testing is considered an integral part of a complete examination. Limited examinations for reoccurring indications may be performed as noted. The reflux portion of the exam is performed with the patient in reverse Trendelenburg.  +---------+---------------+---------+-----------+----------+--------------+ RIGHT    CompressibilityPhasicitySpontaneityPropertiesThrombus Aging +---------+---------------+---------+-----------+----------+--------------+ CFV      Full           Yes      Yes                                 +---------+---------------+---------+-----------+----------+--------------+ SFJ      Full                                                        +---------+---------------+---------+-----------+----------+--------------+ FV Prox  Full                                                         +---------+---------------+---------+-----------+----------+--------------+ FV Mid   Full                                                        +---------+---------------+---------+-----------+----------+--------------+ FV DistalFull                                                        +---------+---------------+---------+-----------+----------+--------------+ PFV      Full                                                        +---------+---------------+---------+-----------+----------+--------------+ POP      Full           Yes      Yes                                 +---------+---------------+---------+-----------+----------+--------------+ PTV      Full                                                        +---------+---------------+---------+-----------+----------+--------------+ PERO     Full                                                        +---------+---------------+---------+-----------+----------+--------------+   +---------+---------------+---------+-----------+----------+--------------+ LEFT  CompressibilityPhasicitySpontaneityPropertiesThrombus Aging +---------+---------------+---------+-----------+----------+--------------+ CFV      Full           Yes      Yes                                 +---------+---------------+---------+-----------+----------+--------------+ SFJ      Full                                                        +---------+---------------+---------+-----------+----------+--------------+ FV Prox  Full                                                        +---------+---------------+---------+-----------+----------+--------------+ FV Mid   Full                                                        +---------+---------------+---------+-----------+----------+--------------+ FV DistalFull                                                         +---------+---------------+---------+-----------+----------+--------------+ PFV      Full                                                        +---------+---------------+---------+-----------+----------+--------------+ POP      Full           Yes      Yes                                 +---------+---------------+---------+-----------+----------+--------------+ PTV      Full                                                        +---------+---------------+---------+-----------+----------+--------------+ PERO     Full                                                        +---------+---------------+---------+-----------+----------+--------------+     Summary: RIGHT: - There is no evidence of deep vein thrombosis in the lower extremity.  - No cystic structure found in the popliteal fossa.  LEFT: - There is no evidence of deep vein thrombosis in the lower extremity.  - No cystic structure found in the  popliteal fossa.  *See table(s) above for measurements and observations. Electronically signed by Coral Else MD on 01/08/2020 at 5:49:38 PM.    Final        Assessment/Plan: Diagnosis: Right MCA infarct with hemorrhagic transformation Stroke: Continue secondary stroke prophylaxis and Risk Factor Modification listed below:   Antiplatelet therapy: On hold Blood Pressure Management:  Continue current medication with prn's with permisive HTN per primary team Statin Agent:   Tobacco abuse: Counsel Left sided hemiparesis: fit for orthosis to prevent contractures (resting hand splint for day, wrist cock up splint at night, PRAFO, etc) PT/OT for mobility, ADL training  Motor recovery: Fluoxetine Labs and images (see above) independently reviewed.  Records reviewed and summated above.   1. Does the need for close, 24 hr/day medical supervision in concert with the patient's rehab needs make it unreasonable for this patient to be served in a less intensive setting?  Yes 2. Co-Morbidities requiring supervision/potential complications: alcohol/tobacco/marijuana (counseled on appropriate), chronic back pain, hypokalemia (continue to monitor and replete as necessary), post stroke dysphagia (advance diet as tolerated) 3. Due to bladder management, bowel management, safety, disease management, medication administration and patient education, does the patient require 24 hr/day rehab nursing? Yes 4. Does the patient require coordinated care of a physician, rehab nurse, therapy disciplines of PT/OT/SLP to address physical and functional deficits in the context of the above medical diagnosis(es)? Yes Addressing deficits in the following areas: balance, endurance, locomotion, strength, transferring, bathing, dressing, toileting, cognition, speech, swallowing and psychosocial support 5. Can the patient actively participate in an intensive therapy program of at least 3 hrs of therapy per day at least 5 days per week? Potentially 6. The potential for patient to make measurable gains while on inpatient rehab is excellent 7. Anticipated functional outcomes upon discharge from inpatient rehab are min assist  with PT, min assist with OT, supervision with SLP. 8. Estimated rehab length of stay to reach the above functional goals is: 17-20 days. 9. Anticipated discharge destination: Home 10. Overall Rehab/Functional Prognosis: good   RECOMMENDATIONS: This patient's condition is appropriate for continued rehabilitative care in the following setting: CIR caregiver support available upon discharge. Patient has agreed to participate in recommended program. Potentially Note that insurance prior authorization may be required for reimbursement for recommended care.   Comment: Rehab Admissions Coordinator to follow up.   I have personally performed a face to face diagnostic evaluation, including, but not limited to relevant history and physical exam findings, of this patient and  developed relevant assessment and plan.  Additionally, I have reviewed and concur with the physician assistant's documentation above.    Maryla Morrow, MD, ABPMR Mcarthur Rossetti Angiulli, PA-C 01/09/2020

## 2020-01-15 NOTE — Progress Notes (Addendum)
Inpatient Rehab Admissions Coordinator:   Received insurance authorization for pt to admit to CIR today, pending approval from Dr. Marland Mcalpine.  Will let pt/family, and CSW know.   Addendum: Dr. Marland Mcalpine approved d/c to CIR today.   Estill Dooms, PT, DPT Admissions Coordinator [phone] 01/15/20  2:17 PM

## 2020-01-15 NOTE — Progress Notes (Signed)
Discharged per w/c to 4W room 15.  Belongings with, including cell phone, Stage manager.

## 2020-01-16 ENCOUNTER — Inpatient Hospital Stay (HOSPITAL_COMMUNITY): Payer: BC Managed Care – PPO | Admitting: Occupational Therapy

## 2020-01-16 ENCOUNTER — Inpatient Hospital Stay (HOSPITAL_COMMUNITY): Payer: BC Managed Care – PPO | Admitting: Speech Pathology

## 2020-01-16 ENCOUNTER — Inpatient Hospital Stay (HOSPITAL_COMMUNITY): Payer: BC Managed Care – PPO | Admitting: Physical Therapy

## 2020-01-16 DIAGNOSIS — G8194 Hemiplegia, unspecified affecting left nondominant side: Secondary | ICD-10-CM

## 2020-01-16 DIAGNOSIS — I10 Essential (primary) hypertension: Secondary | ICD-10-CM

## 2020-01-16 DIAGNOSIS — I69391 Dysphagia following cerebral infarction: Secondary | ICD-10-CM

## 2020-01-16 DIAGNOSIS — I63511 Cerebral infarction due to unspecified occlusion or stenosis of right middle cerebral artery: Secondary | ICD-10-CM

## 2020-01-16 LAB — COMPREHENSIVE METABOLIC PANEL
ALT: 62 U/L — ABNORMAL HIGH (ref 0–44)
AST: 56 U/L — ABNORMAL HIGH (ref 15–41)
Albumin: 3.4 g/dL — ABNORMAL LOW (ref 3.5–5.0)
Alkaline Phosphatase: 83 U/L (ref 38–126)
Anion gap: 12 (ref 5–15)
BUN: 16 mg/dL (ref 6–20)
CO2: 26 mmol/L (ref 22–32)
Calcium: 9.3 mg/dL (ref 8.9–10.3)
Chloride: 103 mmol/L (ref 98–111)
Creatinine, Ser: 1.07 mg/dL (ref 0.61–1.24)
GFR calc Af Amer: >60 mL/min (ref >60)
GFR calc non Af Amer: >60 mL/min (ref >60)
Glucose, Bld: 97 mg/dL (ref 70–99)
Potassium: 3.7 mmol/L (ref 3.5–5.1)
Sodium: 141 mmol/L (ref 135–145)
Total Bilirubin: 0.6 mg/dL (ref 0.3–1.2)
Total Protein: 7.1 g/dL (ref 6.5–8.1)

## 2020-01-16 LAB — CBC WITH DIFFERENTIAL/PLATELET
Abs Immature Granulocytes: 0.04 10*3/uL (ref 0.00–0.07)
Basophils Absolute: 0.1 10*3/uL (ref 0.0–0.1)
Basophils Relative: 1 %
Eosinophils Absolute: 0.2 10*3/uL (ref 0.0–0.5)
Eosinophils Relative: 3 %
HCT: 44.2 % (ref 39.0–52.0)
Hemoglobin: 14.6 g/dL (ref 13.0–17.0)
Immature Granulocytes: 1 %
Lymphocytes Relative: 32 %
Lymphs Abs: 1.9 10*3/uL (ref 0.7–4.0)
MCH: 29.4 pg (ref 26.0–34.0)
MCHC: 33 g/dL (ref 30.0–36.0)
MCV: 89.1 fL (ref 80.0–100.0)
Monocytes Absolute: 0.9 10*3/uL (ref 0.1–1.0)
Monocytes Relative: 14 %
Neutro Abs: 3 10*3/uL (ref 1.7–7.7)
Neutrophils Relative %: 49 %
Platelets: 313 10*3/uL (ref 150–400)
RBC: 4.96 MIL/uL (ref 4.22–5.81)
RDW: 16.2 % — ABNORMAL HIGH (ref 11.5–15.5)
WBC: 6.1 10*3/uL (ref 4.0–10.5)
nRBC: 0 % (ref 0.0–0.2)

## 2020-01-16 MED ORDER — BACLOFEN 5 MG HALF TABLET
5.0000 mg | ORAL_TABLET | Freq: Two times a day (BID) | ORAL | Status: DC
  Administered 2020-01-16 – 2020-01-21 (×11): 5 mg via ORAL
  Filled 2020-01-16 (×11): qty 1

## 2020-01-16 NOTE — Progress Notes (Signed)
Inpatient Rehabilitation Medication Review by a Pharmacist  A complete drug regimen review was completed for this patient to identify any potential clinically significant medication issues.  Clinically significant medication issues were identified:  no  Pharmacist comments: none  Time spent performing this drug regimen review (minutes): 10 mins   Fabio Neighbors, PharmD PGY1 Ambulatory Care Resident [employer] # [phone]

## 2020-01-16 NOTE — Progress Notes (Signed)
Patient refused nicotene patch and also lidocaine patch this morning. No complications noted at this time. Continue plan of care. Jay Schlichter, LPN

## 2020-01-16 NOTE — Progress Notes (Signed)
[facility] PHYSICAL MEDICINE & REHABILITATION PROGRESS NOTE   Subjective/Complaints: Says he slept last night. Having spasms in right leg. Not much appetite. Denies frank pain.   ROS: Patient denies fever, rash, sore throat, blurred vision, nausea, vomiting, diarrhea, cough, shortness of breath or chest pain, joint or back pain, headache, or mood change.    Objective:   DG Swallowing Func-Speech Pathology  Result Date: 01/14/2020 Objective Swallowing Evaluation: Type of Study: MBS-Modified Barium Swallow Study  Patient Details Name: ONNIE HATCHEL MRN: [id number] Date of Birth: Sep 12, 1976 Today's Date: 01/14/2020 Time: SLP Start Time (ACUTE ONLY): 1225 -SLP Stop Time (ACUTE ONLY): 1236 SLP Time Calculation (min) (ACUTE ONLY): 11 min Past Medical History: Past Medical History: Diagnosis Date . 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/07/2020 . Alcohol dependence (HCC)  . Back pain  . Marijuana dependence (HCC)  . Tobacco dependence  Past Surgical History: Past Surgical History: Procedure Laterality Date . BUBBLE STUDY  01/10/2020  Procedure: BUBBLE STUDY;  Surgeon: Sande Rives, MD;  Location: [facility] ENDOSCOPY;  Service: Cardiovascular;; . TEE WITHOUT CARDIOVERSION N/A 01/10/2020  Procedure: TRANSESOPHAGEAL ECHOCARDIOGRAM (TEE);  Surgeon: Sande Rives, MD;  Location: [facility] ENDOSCOPY;  Service: Cardiovascular;  Laterality: N/A; HPI:  OSMANI KERSTEN is a 44 y.o. male with no known medical history presenting as Code Stroke.  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 There is hemorrhagic transformation of the right basal ganglia  Subjective: Pt was lethargic Assessment / Plan / Recommendation CHL IP CLINICAL IMPRESSIONS 01/14/2020 Clinical Impression Pt demonstrates a mild dysphagia with instances of left anterior bolus loss and fairly consistent delay in swallow initiation to pyriform sinuses. Despite these deficits, pt was able to orally manage thin  liquids with a straw and masticate solids without oral residue. Pt only had instances of trace flash penetration, and one very trace frank penetration event with sensation, even when challenged with large consecutive sips.  Pt is able to upgrade diet to regular solids and thin liquids. Will f/u for tolerance and compensatory strategies as needed.  SLP Visit Diagnosis Dysphagia, oropharyngeal phase (R13.12) Attention and concentration deficit following -- Frontal lobe and executive function deficit following -- Impact on safety and function Mild aspiration risk   CHL IP TREATMENT RECOMMENDATION 01/14/2020 Treatment Recommendations Therapy as outlined in treatment plan below   Prognosis 01/14/2020 Prognosis for Safe Diet Advancement Good Barriers to Reach Goals -- Barriers/Prognosis Comment -- CHL IP DIET RECOMMENDATION 01/14/2020 SLP Diet Recommendations Regular solids;Thin liquid Liquid Administration via Cup;Straw Medication Administration Whole meds with puree Compensations Slow rate;Small sips/bites;Lingual sweep for clearance of pocketing Postural Changes Seated upright at 90 degrees   CHL IP OTHER RECOMMENDATIONS 01/14/2020 Recommended Consults -- Oral Care Recommendations Oral care BID Other Recommendations --   CHL IP FOLLOW UP RECOMMENDATIONS 01/14/2020 Follow up Recommendations Inpatient Rehab   CHL IP FREQUENCY AND DURATION 01/14/2020 Speech Therapy Frequency (ACUTE ONLY) min 2x/week Treatment Duration 2 weeks      CHL IP ORAL PHASE 01/14/2020 Oral Phase Impaired Oral - Pudding Teaspoon -- Oral - Pudding Cup -- Oral - Honey Teaspoon -- Oral - Honey Cup -- Oral - Nectar Teaspoon -- Oral - Nectar Cup -- Oral - Nectar Straw WFL Oral - Thin Teaspoon -- Oral - Thin Cup Left anterior bolus loss Oral - Thin Straw WFL Oral - Puree WFL Oral - Mech Soft -- Oral - Regular WFL Oral - Multi-Consistency -- Oral - Pill Decreased bolus cohesion Oral Phase -  Comment --  CHL IP PHARYNGEAL PHASE 01/14/2020 Pharyngeal Phase Impaired  Pharyngeal- Pudding Teaspoon -- Pharyngeal -- Pharyngeal- Pudding Cup -- Pharyngeal -- Pharyngeal- Honey Teaspoon -- Pharyngeal -- Pharyngeal- Honey Cup -- Pharyngeal -- Pharyngeal- Nectar Teaspoon -- Pharyngeal -- Pharyngeal- Nectar Cup -- Pharyngeal -- Pharyngeal- Nectar Straw Delayed swallow initiation-vallecula Pharyngeal -- Pharyngeal- Thin Teaspoon -- Pharyngeal -- Pharyngeal- Thin Cup Delayed swallow initiation-pyriform sinuses Pharyngeal -- Pharyngeal- Thin Straw Delayed swallow initiation-pyriform sinuses Pharyngeal -- Pharyngeal- Puree Delayed swallow initiation-vallecula Pharyngeal -- Pharyngeal- Mechanical Soft -- Pharyngeal -- Pharyngeal- Regular Delayed swallow initiation-vallecula Pharyngeal -- Pharyngeal- Multi-consistency -- Pharyngeal -- Pharyngeal- Pill -- Pharyngeal -- Pharyngeal Comment --  No flowsheet data found. DeBlois, Katherene Ponto 01/14/2020, 1:27 PM              Recent Labs    01/15/20 2128 01/16/20 0512  WBC 6.6 6.1  HGB 14.7 14.6  HCT 43.3 44.2  PLT 311 313   Recent Labs    01/15/20 2128 01/16/20 0512  NA  --  141  K  --  3.7  CL  --  103  CO2  --  26  GLUCOSE  --  97  BUN  --  16  CREATININE 1.05 1.07  CALCIUM  --  9.3    Intake/Output Summary (Last 24 hours) at 01/16/2020 0848 Last data filed at 01/16/2020 0515 Gross per 24 hour  Intake --  Output 350 ml  Net -350 ml     Physical Exam: Vital Signs Blood pressure 133/87, pulse 78, temperature 99.6 F (37.6 C), resp. rate 16, height 6\' 1"  (1.854 m), weight 90.2 kg, SpO2 99 %.   General: Alert and oriented x 3, No apparent distress HEENT: Head is normocephalic, atraumatic, PERRLA, EOMI, sclera anicteric, oral mucosa pink and moist, dentition intact, ext ear canals clear,  Neck: Supple without JVD or lymphadenopathy Heart: Reg rate and rhythm. No murmurs rubs or gallops Chest: CTA bilaterally without wheezes, rales, or rhonchi; no distress Abdomen: Soft, non-tender, non-distended, bowel sounds  positive. Extremities: No clubbing, cyanosis, or edema. Pulses are 2+ Skin: Clean and intact without signs of breakdown Neuro: left inattention, left cn7, LUE and LLE 0/5. Senses pain on left side. No resting tone LUE. LLE with extensor spasms and clonus with PROM. Fair insight and awareness. Alert and oriented x 3.  Musculoskeletal: Full PROM, No pain PROM in the neck, trunk, or extremities. Posture appropriate Psych: Pt's affect is flat but cooperative     Assessment/Plan: 1. Functional deficits secondary to right MCA infarct which require 3+ hours per day of interdisciplinary therapy in a comprehensive inpatient rehab setting.  Physiatrist is providing close team supervision and 24 hour management of active medical problems listed below.  Physiatrist and rehab team continue to assess barriers to discharge/monitor patient progress toward functional and medical goals  Care Tool:  Bathing              Bathing assist       Upper Body Dressing/Undressing Upper body dressing        Upper body assist      Lower Body Dressing/Undressing Lower body dressing            Lower body assist       Toileting Toileting    Toileting assist Assist for toileting: Set up assist(urinal use)     Transfers Chair/bed transfer  Transfers assist           Locomotion Ambulation   Ambulation assist  Walk 10 feet activity   Assist           Walk 50 feet activity   Assist           Walk 150 feet activity   Assist           Walk 10 feet on uneven surface  activity   Assist           Wheelchair     Assist               Wheelchair 50 feet with 2 turns activity    Assist            Wheelchair 150 feet activity     Assist          Blood pressure 133/87, pulse 78, temperature 99.6 F (37.6 C), resp. rate 16, height 6\' 1"  (1.854 m), weight 90.2 kg, SpO2 99 %.  Medical Problem List and Plan: 1.   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  Status post TEE 01/10/2020             -patient may shower             -ELOS/Goals: 2-3 weeks; supervision to min assist  -Patient is beginning CIR therapies today including PT and OT SLP  -PRAFO/WHO ordered 2.  Antithrombotics: -DVT/anticoagulation: Lovenox initiated 01/10/2020             -antiplatelet therapy: Aspirin 325 mg daily 3. Pain Management: Lidoderm patch, oxycodone 5 mg every 6 hours as needed 4. Mood: Advised emotional support  -neuropsych             -antipsychotic agents: N/A 5. Neuropsych: This patient is capable of making decisions on his own behalf. 6. Skin/Wound Care: Routine skin checks 7. Fluids/Electrolytes/Nutrition: encourage PO  -I personally reviewed the patient's labs today.   8.  Dysphagia.  Dysphagia #2 nectar liquids.  Follow-up speech therapy 9.  Permissive hypertension.  Norvasc 10 mg daily.  Monitor with increased mobility 10.  Hyperlipidemia.  Lipitor 11.  History of tobacco/alcohol/marijuana use.  NicoDerm patch.  Monitor for any signs of withdrawal provide counseling 12. Early extensor tone LLE:  -begin trial of baclofen 5mg  bid   -PRAFO  LOS: 1 days A FACE TO FACE EVALUATION WAS PERFORMED  03/11/2020 01/16/2020, 8:48 AM

## 2020-01-16 NOTE — Progress Notes (Signed)
Inpatient Rehabilitation  Patient information reviewed and entered into eRehab system by Ilai Hiller M. Zaahir Pickney, M.A., CCC/SLP, PPS Coordinator.  Information including medical coding, functional ability and quality indicators will be reviewed and updated through discharge.    

## 2020-01-16 NOTE — Discharge Instructions (Signed)
Inpatient Rehab Discharge Instructions  Derek [name] Discharge date and time: No discharge date for patient encounter.   Activities/Precautions/ Functional Status: Activity: activity as tolerated Diet: Regular Wound Care: none needed Functional status:  ___ No restrictions     ___ Walk up steps independently ___ 24/7 supervision/assistance   ___ Walk up steps with assistance ___ Intermittent supervision/assistance  ___ Bathe/dress independently ___ Walk with walker     ___ Bathe/dress with assistance ___ Walk Independently    ___ Shower independently ___ Walk with assistance    ___ Shower with assistance ___ No alcohol     ___ Return to work/school ________  Special Instructions: No driving smoking or alcohol STROKE/TIA DISCHARGE INSTRUCTIONS SMOKING Cigarette smoking nearly doubles your risk of having a stroke & is the single most alterable risk factor  If you smoke or have smoked in the last 12 months, you are advised to quit smoking for your health.  Most of the excess cardiovascular risk related to smoking disappears within a year of stopping.  Ask you doctor about anti-smoking medications  [place] Quit Line: 1-800-QUIT NOW  Free Smoking Cessation Classes [phone]  CHOLESTEROL Know your levels; limit fat & cholesterol in your diet  Lipid Panel     Component Value Date/Time   CHOL 204 (H) 01/08/2020 0450   TRIG 94 01/08/2020 0450   HDL 47 01/08/2020 0450   CHOLHDL 4.3 01/08/2020 0450   VLDL 19 01/08/2020 0450   LDLCALC 138 (H) 01/08/2020 0450      Many patients benefit from treatment even if their cholesterol is at goal.  Goal: Total Cholesterol (CHOL) less than 160  Goal:  Triglycerides (TRIG) less than 150  Goal:  HDL greater than 40  Goal:  LDL (LDLCALC) less than 100   BLOOD PRESSURE American Stroke Association blood pressure target is less that 120/80 mm/Hg  Your discharge blood pressure is:  BP: 133/87  Monitor your blood pressure  Limit your salt  and alcohol intake  Many individuals will require more than one medication for high blood pressure  DIABETES (A1c is a blood sugar average for last 3 months) Goal HGBA1c is under 7% (HBGA1c is blood sugar average for last 3 months)  Diabetes: No known diagnosis of diabetes    Lab Results  Component Value Date   HGBA1C 5.6 01/08/2020     Your HGBA1c can be lowered with medications, healthy diet, and exercise.  Check your blood sugar as directed by your physician  Call your physician if you experience unexplained or low blood sugars.  PHYSICAL ACTIVITY/REHABILITATION Goal is 30 minutes at least 4 days per week  Activity: Increase activity slowly, Therapies: Physical Therapy: Home Health Return to work:   Activity decreases your risk of heart attack and stroke and makes your heart stronger.  It helps control your weight and blood pressure; helps you relax and can improve your mood.  Participate in a regular exercise program.  Talk with your doctor about the best form of exercise for you (dancing, walking, swimming, cycling).  DIET/WEIGHT Goal is to maintain a healthy weight  Your discharge diet is:  Diet Order            Diet regular Room service appropriate? Yes; Fluid consistency: Thin  Diet effective now              liquids Your height is:  Height: 6\' 1"  (185.4 cm) Your current weight is: Weight: 90.2 kg Your Body Mass Index (BMI) is:  BMI (Calculated): 26.24  Following the type of diet specifically designed for you will help prevent another stroke.  Your goal weight range is:    Your goal Body Mass Index (BMI) is 19-24.  Healthy food habits can help reduce 3 risk factors for stroke:  High cholesterol, hypertension, and excess weight.  RESOURCES Stroke/Support Group:  Call [phone]   STROKE EDUCATION PROVIDED/REVIEWED AND GIVEN TO PATIENT Stroke warning signs and symptoms How to activate emergency medical system (call 911). Medications prescribed at  discharge. Need for follow-up after discharge. Personal risk factors for stroke. Pneumonia vaccine given:  Flu vaccine given:  My questions have been answered, the writing is legible, and I understand these instructions.  I will adhere to these goals & educational materials that have been provided to me after my discharge from the hospital.      My questions have been answered and I understand these instructions. I will adhere to these goals and the provided educational materials after my discharge from the hospital.  Patient/Caregiver Signature _______________________________ Date __________  Clinician Signature _______________________________________ Date __________  Please bring this form and your medication list with you to all your follow-up doctor's appointments.

## 2020-01-16 NOTE — Evaluation (Signed)
Physical Therapy Assessment and Plan  Patient Details  Name: Derek [name] MRN: [id number] Date of Birth: 10-29-1976  PT Diagnosis: Abnormality of gait, Cognitive deficits, Coordination disorder, Difficulty walking, Hemiparesis non-dominant, Impaired cognition, Impaired sensation and Muscle weakness Rehab Potential: Good ELOS: 3.5-4 weeks   Today's Date: 01/16/2020 PT Individual Time: 9794-8016 PT Individual Time Calculation (min): 68 min    Problem List:  Patient Active Problem List   Diagnosis Date Noted  . 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 Leukocytosis 01/12/2020  . Cerebrovascular accident (CVA) due to thrombosis of right middle cerebral artery ([place])   . Chronic pain syndrome   . Hypokalemia   . Dysphagia, post-stroke   . Acute ischemic right MCA stroke ([place]) 01/07/2020  . Tobacco dependence   . Marijuana dependence (Derek [name])   . Alcohol dependence (Derek [name])     Past Medical History:  Past Medical History:  Diagnosis Date  . Acute ischemic right MCA stroke (Derek [name]) 01/07/2020  . Alcohol dependence (Derek [name])   . Back pain   . Marijuana dependence (Derek [name])   . Tobacco dependence    Past Surgical History:  Past Surgical History:  Procedure Laterality Date  . BUBBLE STUDY  01/10/2020   Procedure: BUBBLE STUDY;  Surgeon: Geralynn Rile, MD;  Location: [facility];  Service: Cardiovascular;;  . TEE WITHOUT CARDIOVERSION N/A 01/10/2020   Procedure: TRANSESOPHAGEAL ECHOCARDIOGRAM (TEE);  Surgeon: Geralynn Rile, MD;  Location: [facility];  Service: Cardiovascular;  Laterality: N/A;    Assessment & Plan Clinical Impression: Patient is a 44 y.o. year old male with history of alcohol/tobacco/marijuana use as well as chronic back pain. History taken from chart review. Patient lives with girlfriend and independent prior to admission. He is employed as a Programmer, systems. Presented 01/07/2020 with left hemiparesis. Blood pressure 184/110. Cranial CT  scan showed right MCA infarction with mass-effect possible petechial hemorrhage in portions of the right basal ganglia. Infarcts extending to the posterior superior right frontal and adjacent right parietal lobe. Suspect thrombus/embolus in the M1 segment of right middle cerebral artery. MRI showed acute right MCA infarction involving basal ganglia and right parietal lobe. Hemorrhagic transformation of the right basal ganglia infarction with mass-effect on the lateral ventricle. Echocardiogram with ejection fraction 65%. Lower extremity Dopplers negative for DVT. Admission chemistries unremarkable except glucose 134, WBC 14,300, CK 787, blood cultures no growth to date, lactic acid within normal limits 1.9, urine culture no growth, urine drug screen positive marijuana. Neurology consulted with TEE completed 01/10/2020 showing no PFO /Thrombus. and currently maintained on aspirin 325 mg daily for CVA prophylaxis. Hold on DAPT at this time given hemorrhagic transformation. Follow-up repeat cranial CT scan 01/09/2020 evolving right MCA infarction again with hemorrhagic transformation, 3 mm right to left shift. No plan for 3% saline at this time and scan was again completed 01/10/2020 showing no new hemorrhage hydrocephalus or worsening mass-effect. Lovenox was initiated for DVT prophylaxis 01/10/2020. Patient did have a low-grade fever 100.5 leukocytosis 14,300 improved to 7.0. Urine culture no growth urinalysis negative blood cultures no growth to date. Therapy evaluations completed and patient was admitted for a comprehensive rehab program.  Patient transferred to CIR on 01/15/2020 .   Patient currently requires max with mobility secondary to muscle weakness, decreased cardiorespiratoy endurance, decreased coordination, decreased visual perceptual skills, decreased attention to left, decreased awareness, decreased problem solving, decreased safety awareness and decreased memory, and decreased sitting balance, decreased  standing balance, decreased postural control, decreased balance strategies and  L hemiparesis.  Prior to hospitalization, patient was independent  with mobility and lived with Significant other(girlfriend, girlfriends son (he's 62)) in a House home.  Home access is  (pt reports "a couple steps" to get into house but someone plans to install ramp prior to his d/c).  Patient will benefit from skilled PT intervention to maximize safe functional mobility, minimize fall risk and decrease caregiver burden for planned discharge home with 24 hour assist.  Anticipate patient will benefit from follow up [facility] at discharge.  PT - End of Session Activity Tolerance: Tolerates 30+ min activity with multiple rests Endurance Deficit: Yes Endurance Deficit Description: generalized deconditioning PT Assessment Rehab Potential (ACUTE/IP ONLY): Good PT Barriers to Discharge: Decreased caregiver support;Home environment access/layout;Inaccessible home environment;Lack of/limited family support PT Barriers to Discharge Comments: unsure if family can provide physical assist at d/c, unsure if house is w/c acessible if necessary, steps to enter home & full flight to bed/bath on 2nd level PT Patient demonstrates impairments in the following area(s): Balance;Perception;Behavior;Safety;Sensory;Endurance;Motor PT Transfers Functional Problem(s): Bed Mobility;Bed to Chair;Car;Furniture PT Locomotion Functional Problem(s): Stairs;Wheelchair Mobility;Ambulation PT Plan PT Intensity: Minimum of 1-2 x/day ,45 to 90 minutes PT Frequency: 5 out of 7 days PT Duration Estimated Length of Stay: 3.5-4 weeks PT Treatment/Interventions: Ambulation/gait training;Community reintegration;DME/adaptive equipment instruction;Neuromuscular re-education;Psychosocial support;Stair training;UE/LE Strength taining/ROM;Wheelchair propulsion/positioning;UE/LE Coordination activities;Therapeutic Activities;Skin care/wound management;Pain  management;Functional electrical stimulation;Discharge planning;Balance/vestibular training;Cognitive remediation/compensation;Disease management/prevention;Patient/family education;Functional mobility training;Splinting/orthotics;Therapeutic Exercise;Visual/perceptual remediation/compensation PT Transfers Anticipated Outcome(s): min assist with LRAD PT Locomotion Anticipated Outcome(s): supervision w/c level, mod assist gait with PT PT Recommendation Recommendations for Other Services: Speech consult;Neuropsych consult;Therapeutic Recreation consult Therapeutic Recreation Interventions: Stress management;Outing/community reintergration Follow Up Recommendations: Home health PT;24 hour supervision/assistance Patient destination: Home Equipment Recommended: Wheelchair (measurements);Wheelchair cushion (measurements) Equipment Details: 18x18 hemi height with L half lap tray  Skilled Therapeutic Intervention Patient received in bed with nurse in room reporting pt needs to change shirts 2/2 spilling lunch. Therapist provided pt with paper scrub pants & shirt & assisted pt with donning clean clothing. Pt requires mod assist with cuing for compensatory technique to roll R with bed rails. Therapist doffs L knee immobilizer (per PA, Dan, pt does not need this) & educated pt on no need for immobilizer. PT evaluation initiated with therapist educating pt on daily therapy schedule, weekly team meetings, ELOS, and other CIR information. Briefly discussed stroke & stroke recovery. Pt transfers supine>sit with compensatory technique & use of hospital bed features with extra time to upright trunk. Pt completes squat pivot transfers bed>w/c on R with max assist with cuing for overall technique to pivot to w/c then scoot back in seat. Provided pt with w/c cushion & LUE arm trough to prevent skin breakdown & promote OOB tolerance. Pt completes car transfer at [place] simulated height (pt reports he will ride home in either  a sedan or a small SUV) with max assist overall with ongoing cuing for squat pivot technique. Pt engaged in balance task with focus on anterior/L weight shifting with SBA/CGA overall. Pt propels w/c ortho gym>room with R hemi technique with instructional cuing & mod assist overall. At end of session pt left in w/c with all needs in reach, call bell at hand, and chair alarm donned, pt set up with ice cream.  PT Evaluation Precautions/Restrictions Precautions Precautions: Fall Precaution Comments: L neglect, hemiparesis Restrictions Weight Bearing Restrictions: No   General Chart Reviewed: Yes Additional Pertinent History: alcohol, tobacco & marijuana abuse, chronic back pain Response to Previous Treatment: Patient with  no complaints from previous session. Family/Caregiver Present: No   Pain pt denies c/o pain  Home Living/Prior Functioning Home Living Available Help at Discharge: Family;Available 24 hours/day(pt's girlfriend works from home) Type of Home: House Home Access: (pt reports "a couple steps" to get into house but someone plans to install ramp prior to his d/c) Home Layout: Two level;1/2 bath on main level Alternate Level Stairs-Number of Steps: Full flight to bedroom/bathroom on 2nd floor Alternate Level Stairs-Rails: Right;Can reach both;Left Bathroom Shower/Tub: Chiropodist: Standard Bathroom Accessibility: Yes  Lives With: Significant other(girlfriend, girlfriends son (he's 61)) Prior Function Level of Independence: Independent with basic ADLs;Independent with homemaking with ambulation;Independent with gait;Independent with transfers  Able to Take Stairs?: Yes Driving: Yes Vocation: Full time employment Vocation Requirements: long distance truck driver Leisure: Hobbies-yes (Comment)(pt has an 63 y/o son he sees every other weekend, enjoys watching tv, has chihuahua dog) Comments: recently hurt back picking up something at work ~5-6 months  ago  Vision/Perception  Pt reports he is supposed to be wearing glasses (was told this ~1 year ago) for reading but states he hasn't gotten them yet. Pt denies changes in baseline vision. Perception Perception: (slight decreased attention to L) Praxis Praxis: Intact   Cognition Overall Cognitive Status: Impaired/Different from baseline Arousal/Alertness: Awake/alert Orientation Level: Oriented X4 Memory: Impaired Memory Impairment: Decreased recall of new information Awareness: Impaired Awareness Impairment: Anticipatory impairment Problem Solving: Impaired Problem Solving Impairment: Functional basic Safety/Judgment: Impaired Comments: Patient requires cueing for safety awareness.   Sensation Sensation Light Touch: Impaired Detail(unable to feel therapist touching LLE) Proprioception: Impaired Detail(absent LLE) Coordination Gross Motor Movements are Fluid and Coordinated: No Fine Motor Movements are Fluid and Coordinated: No Coordination and Movement Description: L hemiparesis   Motor  Motor Motor: Abnormal postural alignment and control Motor - Skilled Clinical Observations: L hemiparesis, generalized deconditioning   Mobility Bed Mobility Bed Mobility: Supine to Sit;Rolling Right;Rolling Left Rolling Right: Moderate Assistance - Patient 50-74% Rolling Left: Minimal Assistance - Patient > 75% Supine to Sit: Supervision/Verbal cueing(bed features, compensatory technique) Transfers Transfers: Pharmacist, hospital Pivot Transfers: Maximal Assistance - Patient 25-49%   Locomotion  Gait Ambulation: No Gait Gait: No Stairs / Additional Locomotion Stairs: No Wheelchair Mobility Wheelchair Mobility: Yes Wheelchair Assistance: Moderate Assistance - Patient 50 - 74% Wheelchair Propulsion: Right upper extremity;Right lower extremity Wheelchair Parts Management: Needs assistance Distance: 150 f   Trunk/Postural Assessment  Postural Control Postural  Control: Deficits on evaluation Trunk Control: delayed Righting Reactions: delayed   Balance Balance Balance Assessed: Yes Static Sitting Balance Static Sitting - Balance Support: Feet supported;Right upper extremity supported Static Sitting - Level of Assistance: 5: Stand by assistance  Dynamic Sitting [place] Dynamic Sitting - Balance Support: Feet supported Dynamic Sitting - Level of Assistance: (SBA<>CGA)  Extremity Assessment  Per OT Assessment: RUE Assessment RUE Assessment: Within Functional Limits Active Range of Motion (AROM) Comments: WFL from 0-180 degrees General Strength Comments: MMT 5/5 LUE Assessment LUE Assessment: Exceptions to [facility] Passive Range of Motion (PROM) Comments: WFL 0-180 degrees shoulder flexion. WFL elbow, wrist, and digits 1-5. Active Range of Motion (AROM) Comments: Flaccid General Strength Comments: MMT 0/5 LUE Body System: Neuro Brunstrum levels for arm and hand: Arm;Hand Brunstrum level for arm: Stage I Presynergy Brunstrum level for hand: Stage I Flaccidity  RLE Assessment RLE Assessment: Within Functional Limits LLE Assessment Passive Range of Motion (PROM) Comments: WFL General Strength Comments: no active movement noted in LLE    Refer to  Care Plan for Long Term Goals  Recommendations for other services: Neuropsych and Therapeutic Recreation  Stress management and Outing/community reintegration  Discharge Criteria: Patient will be discharged from PT if patient refuses treatment 3 consecutive times without medical reason, if treatment goals not met, if there is a change in medical status, if patient makes no progress towards goals or if patient is discharged from hospital.  The above assessment, treatment plan, treatment alternatives and goals were discussed and mutually agreed upon: by patient  Waunita Schooner 01/16/2020, 4:29 PM

## 2020-01-16 NOTE — Progress Notes (Signed)
Orthopedic Tech Progress Note Patient Details:  Derek [name] 03/16/1976 [id number] Called in order to [employer] for a [employer] and a PRAFO. Patient ID: Derek [name], male   DOB: [birth year], 44 y.o.   MRN: [id number]   [name] 01/16/2020, 9:07 AM

## 2020-01-16 NOTE — Evaluation (Signed)
Speech Language Pathology Assessment and Plan  Patient Details  Name: Derek [name] MRN: [id number] Date of Birth: 04-Aug-1976  SLP Diagnosis: Cognitive Impairments;Dysphagia  Rehab Potential: Good ELOS: 19-21 days    Today's Date: 01/16/2020 SLP Individual Time: 1005-1100 SLP Individual Time Calculation (min): 55 min   Problem List:  Patient Active Problem List   Diagnosis Date Noted  . Right middle cerebral artery stroke ([place]) 01/15/2020  . Leukocytosis 01/12/2020  . Cerebrovascular accident (CVA) due to thrombosis of right middle cerebral artery ([place])   . Chronic pain syndrome   . Hypokalemia   . Dysphagia, post-stroke   . Acute ischemic right MCA stroke ([place]) 01/07/2020  . Tobacco dependence   . Marijuana dependence ([place])   . Alcohol dependence ([place])    Past Medical History:  Past Medical History:  Diagnosis Date  . Acute ischemic right MCA stroke ([place]) 01/07/2020  . Alcohol dependence ([place])   . Back pain   . Marijuana dependence ([place])   . Tobacco dependence    Past Surgical History:  Past Surgical History:  Procedure Laterality Date  . BUBBLE STUDY  01/10/2020   Procedure: BUBBLE STUDY;  Surgeon: Geralynn Rile, MD;  Location: [facility];  Service: Cardiovascular;;  . TEE WITHOUT CARDIOVERSION N/A 01/10/2020   Procedure: TRANSESOPHAGEAL ECHOCARDIOGRAM (TEE);  Surgeon: Geralynn Rile, MD;  Location: [facility];  Service: Cardiovascular;  Laterality: N/A;    Assessment / Plan / Recommendation Clinical Impression Patient  is a 44 year old right-handed male with history of alcohol/tobacco/marijuana use as well as chronic back pain.  History taken from chart review.  Patient lives with girlfriend and independent prior to admission. Presented 01/07/2020 with left hemiparesis.  Cranial CT scan showed right MCA infarction with mass-effect possible petechial hemorrhage in portions of the right basal ganglia. Infarcts extending to the posterior superior right  frontal and adjacent right parietal lobe.  Suspect thrombus/embolus in the M1 segment of right middle cerebral artery.  MRI showed acute right MCA infarction involving basal ganglia and right parietal lobe.  Hemorrhagic transformation of the right basal ganglia infarction with mass-effect on the lateral ventricle. Follow-up repeat cranial CT scan 01/09/2020 evolving right MCA infarction again with hemorrhagic transformation, 3 mm right to left shift. Therapy evaluations completed and patient was admitted for a comprehensive rehab program  01/15/20.  Patient was administered the Cognistat and demonstrates mild impairments in attention, problem solving and short-term recall. Severe impairments were also noted in visual construction tasks. Throughout functional tasks/conversation, patient demonstrates moderate impairments in intellectual awareness as it relates to cognitive deficits and reasoning which impacts his safety with functional and familiar tasks. Patient was also observed with thin liquids via straw without overt s/s of aspiration and demonstrated efficient mastication and oral clearance with solid textures. Patient with 1 episode of coughing with mixed consistencies. Recommend patient continue current diet of regular textures with thin liquids and educated on strategies to maximize safety with mixed consistencies. Patient would benefit from skilled SLP intervention to maximize his cognitive and swallowing function prior to discharge.    Skilled Therapeutic Interventions          Administered a cognitive-linguistic evaluation and BSE, please see above for details.   SLP Assessment  Patient will need skilled Speech Lanaguage Pathology Services during CIR admission    Recommendations  SLP Diet Recommendations: Thin;Age appropriate regular solids Medication Administration: Whole meds with puree Supervision: Patient able to self feed Compensations: Slow rate;Small sips/bites Postural Changes and/or  Swallow Maneuvers: Seated  upright 90 degrees Oral Care Recommendations: Oral care BID Recommendations for Other Services: Neuropsych consult Patient destination: Home Follow up Recommendations: Home Health SLP;Outpatient SLP;24 hour supervision/assistance Equipment Recommended: None recommended by SLP    SLP Frequency 3 to 5 out of 7 days   SLP Duration  SLP Intensity  SLP Treatment/Interventions 19-21 days  Minumum of 1-2 x/day, 30 to 90 minutes  Cognitive remediation/compensation;Dysphagia/aspiration precaution training;Internal/external aids;Therapeutic Activities;Environmental controls;Cueing hierarchy;Functional tasks;Multimodal communication approach;Patient/family education    Pain No/Denies Pain  SLP Evaluation Cognition Overall Cognitive Status: Impaired/Different from baseline Arousal/Alertness: Awake/alert Orientation Level: Oriented X4 Sustained Attention: Impaired Sustained Attention Impairment: Verbal basic;Functional basic Memory: Impaired Memory Impairment: Decreased short term memory Awareness: Impaired Awareness Impairment: Intellectual impairment Problem Solving: Impaired Problem Solving Impairment: Functional basic Safety/Judgment: Impaired  Comprehension Auditory Comprehension Overall Auditory Comprehension: Appears within functional limits for tasks assessed Visual Recognition/Discrimination Discrimination: Not tested Reading Comprehension Reading Status: Not tested Expression Expression Primary Mode of Expression: Verbal Verbal Expression Overall Verbal Expression: Appears within functional limits for tasks assessed Written Expression Dominant Hand: Right Written Expression: Not tested Oral Motor Oral Motor/Sensory Function Overall Oral Motor/Sensory Function: Mild impairment Facial ROM: Reduced left Facial Symmetry: Abnormal symmetry left Facial Strength: Reduced left;Suspected CN VII (facial) dysfunction Facial Sensation: Within  Functional Limits Lingual ROM: Within Functional Limits Lingual Symmetry: Within Functional Limits Lingual Strength: Reduced Motor Speech Overall Motor Speech: Appears within functional limits for tasks assessed Respiration: Within functional limits Phonation: Normal Articulation: Within functional limitis Level of Impairment: Sentence Intelligibility: Intelligible Word: 75-100% accurate Phrase: 75-100% accurate Conversation: 75-100% accurate Motor Planning: Witnin functional limits  Bedside Swallowing Assessment General Date of Onset: 01/07/20 Previous Swallow Assessment: MBS 3/15: Regular textures with thin liquids Diet Prior to this Study: Regular;Thin liquids Temperature Spikes Noted: No Respiratory Status: Room air History of Recent Intubation: No Behavior/Cognition: Alert;Cooperative Oral Cavity - Dentition: Missing dentition Self-Feeding Abilities: Able to feed self Vision: Functional for self-feeding Patient Positioning: Upright in bed Baseline Vocal Quality: Normal Volitional Cough: Strong Volitional Swallow: Able to elicit  Thin Liquid Thin Liquid: Within functional limits Presentation: Straw Nectar Thick Nectar Thick Liquid: Not tested Honey Thick Honey Thick Liquid: Not tested Puree Puree: Within functional limits Presentation: Self Fed;Spoon Solid Solid: Within functional limits Other Comments: 1 episode of coughing with thin liquids with mixed consistencies (mixed fruit cup). BSE Assessment Risk for Aspiration Impact on safety and function: Mild aspiration risk Other Related Risk Factors: Cognitive impairment  Short Term Goals: Week 1: SLP Short Term Goal 1 (Week 1): Patient will consume regular textures with thin liquids with minimal overt s/s of aspiration and Mod I for use of swallowing compensatory strategies. SLP Short Term Goal 2 (Week 1): Patient will demonstrate sustained attention to functional tasks for 15 minutes with Min verbal cues for  redirection. SLP Short Term Goal 3 (Week 1): Patient will recall new, daily information with Mod verbal cues. SLP Short Term Goal 4 (Week 1): Patient will identify 2 cognitive deficits with Mod verbal cues. SLP Short Term Goal 5 (Week 1): Patient will demonstrate functional problem solving for basic and familiar tasks with Min verbal cues.  Refer to Care Plan for Long Term Goals  Recommendations for other services: Neuropsych  Discharge Criteria: Patient will be discharged from SLP if patient refuses treatment 3 consecutive times without medical reason, if treatment goals not met, if there is a change in medical status, if patient makes no progress towards goals or if patient is discharged from hospital.  The above assessment, treatment plan, treatment alternatives and goals were discussed and mutually agreed upon: by patient  Jakera Beaupre 01/16/2020, 11:11 AM

## 2020-01-16 NOTE — Evaluation (Signed)
Occupational Therapy Assessment and Plan  Patient Details  Name: Derek [name] MRN: [id number] Date of Birth: Oct 10, 1976  OT Diagnosis: abnormal posture, altered mental status, ataxia, cognitive deficits, disturbance of vision, flaccid hemiplegia and hemiparesis, hemiplegia affecting non-dominant side, lumbago (low back pain) and muscle weakness (generalized) Rehab Potential:   ELOS: 19-21 days    Today's Date: 01/16/2020 PT Individual Time: 0800-0900 PT Individual Time Calculation (min): 60 min    PT Individual Time: 1415-1445 PT Individual Time Calculation (min): 30 min    Problem List:  Patient Active Problem List   Diagnosis Date Noted  . 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 Leukocytosis 01/12/2020  . Cerebrovascular accident (CVA) due to thrombosis of right middle cerebral artery (Derek [name])   . Chronic pain syndrome   . Hypokalemia   . Dysphagia, post-stroke   . Acute ischemic right MCA stroke (Derek [name]) 01/07/2020  . Tobacco dependence   . Marijuana dependence (Derek [name])   . Alcohol dependence (Derek [name])     Past Medical History:  Past Medical History:  Diagnosis Date  . Acute ischemic right MCA stroke (Derek [name]) 01/07/2020  . Alcohol dependence (Derek [name])   . Back pain   . Marijuana dependence (Derek [name])   . Tobacco dependence    Past Surgical History:  Past Surgical History:  Procedure Laterality Date  . BUBBLE STUDY  01/10/2020   Procedure: BUBBLE STUDY;  Surgeon: Geralynn Rile, MD;  Location: [facility];  Service: Cardiovascular;;  . TEE WITHOUT CARDIOVERSION N/A 01/10/2020   Procedure: TRANSESOPHAGEAL ECHOCARDIOGRAM (TEE);  Surgeon: Geralynn Rile, MD;  Location: [place];  Service: Cardiovascular;  Laterality: N/A;    Assessment & Plan Clinical Impression: Derek [name] is a 44 year old right-handed male with history of alcohol/tobacco/marijuana use as well as chronic back pain.  Presented 01/07/2020 with left hemiparesis.  Blood pressure 184/110.   Cranial CT scan showed right MCA infarction with mass-effect possible petechial hemorrhage in portions of the right basal ganglia.  Infarcts extending to the posterior superior right frontal and adjacent right parietal lobe.  Suspect thrombus/embolus in the M1 segment of right middle cerebral artery.  MRI showed acute right MCA infarction involving basal ganglia and right parietal lobe.  Hemorrhagic transformation of the right basal ganglia infarction with mass-effect on the lateral ventricle.  Echocardiogram with ejection fraction 65%.  Lower extremity Dopplers negative for DVT.  Admission chemistries unremarkable except glucose 134, WBC 14,300, CK 787, blood cultures no growth to date, lactic acid within normal limits 1.9, urine culture no growth, urine drug screen positive marijuana.  Neurology consulted with TEE completed 01/10/2020 showing no PFO /Thrombus. and currently maintained on aspirin 325 mg daily for CVA prophylaxis.  Hold on DAPT at this time given hemorrhagic transformation.  Follow-up repeat cranial CT scan 01/09/2020 evolving right MCA infarction again with hemorrhagic transformation, 3 mm right to left shift.  No plan for 3% saline at this time and scan was again completed 01/10/2020 showing no new hemorrhage hydrocephalus or worsening mass-effect.  Lovenox was initiated for DVT prophylaxis 01/10/2020.  Patient did have a low-grade fever 100.5 leukocytosis 14,300 improved to 7.0.  Urine culture no growth urinalysis negative blood cultures no growth to date.  Pt did have a fall on 01/10/20 with soft tissue injury to head, CT stable.  Therapy evaluations completed and patient was recommended for a comprehensive rehab program.     Patient currently requires max with basic self-care skills secondary to muscle weakness, decreased cardiorespiratoy endurance, impaired timing and  sequencing, abnormal tone, unbalanced muscle activation, ataxia, decreased coordination and decreased motor planning, decreased  visual motor skills and decreased initiation, decreased attention, decreased awareness, decreased problem solving, decreased safety awareness, decreased memory and delayed processing.  Prior to hospitalization, patient could complete all BADLs independently without assistive device. Patient was still driving and working full-time.  Patient will benefit from skilled intervention to decrease level of assist with basic self-care skills and increase independence with basic self-care skills prior to discharge home with care partner works from home and family with aunt and uncle that live nearby. Patient is questionable historian but reports both are able to physically assist.  Anticipate patient will require 24 hour supervision and minimal physical assistance and follow up home health.  OT - End of Session Activity Tolerance: Tolerates 30+ min activity with multiple rests Endurance Deficit: Yes Endurance Deficit Description: generalized deconditioning OT Assessment OT Patient demonstrates impairments in the following area(s): Balance;Cognition;Endurance;Motor;Pain OT Basic ADL's Functional Problem(s): Grooming;Bathing;Dressing;Toileting;Eating OT Transfers Functional Problem(s): Toilet;Tub/Shower OT Additional Impairment(s): Fuctional Use of Upper Extremity OT Plan OT Intensity: Minimum of 1-2 x/day, 45 to 90 minutes OT Frequency: 5 out of 7 days OT Duration/Estimated Length of Stay: 19-21 days OT Treatment/Interventions: Balance/vestibular training;Community reintegration;Cognitive remediation/compensation;Discharge planning;DME/adaptive equipment instruction;Functional electrical stimulation;Functional mobility training;Neuromuscular re-education;Pain management;Patient/family education;Psychosocial support;Self Care/advanced ADL retraining;Therapeutic Activities;Therapeutic Exercise;UE/LE Strength taining/ROM;UE/LE Coordination activities;Visual/perceptual remediation/compensation;Wheelchair  propulsion/positioning OT Self Feeding Anticipated Outcome(s): Mod I OT Basic Self-Care Anticipated Outcome(s): Supervision A to Min A OT Toileting Anticipated Outcome(s): Min A OT Bathroom Transfers Anticipated Outcome(s): Min A OT Recommendation Recommendations for Other Services: Speech consult;Neuropsych consult;Therapeutic Recreation consult Patient destination: Home Follow Up Recommendations: Home health OT;24 hour supervision/assistance Equipment Details: TBD   Skilled Therapeutic Intervention Session 1: Patient met lying supine in bed in agreement with OT treatment session. Patient reporting 8/10 pain chronic pain in lower back with RN notified. Medication provided. Supine to EOB with Mod A and hook technique to advance BLE toward EOB. Patient able to maintain static sitting balance with Min A and dynamic sitting balance for LB bathing/dressing with Max A. Patient completed bathing/dressing seated EOB with Mod A for UB and Max A for LB. With vc's patient able to locate ADL items placed on table in L visual field. Patient demonstrates L inattention and neglect requiring max multimodal cueing for washing/dressing L side. Education provided on hemi-dressing techniques with patient able to return demonstrate with cueing. Sit to stand from EOB with  Max A and strong posterolateral lean to the L requiring maximal cueing for weight shifting to midline. Return to supine requiring Mod A and use of bedrail with patient able to hook RLE under LLE to bring from EOB to bed surface. Patient left lying supine in bed with RN present.   Session 2: Patient received seated in wc in agreement with skilled OT session with focus on self-care re-education. Patient completed toilet transfer with Max A via stand-pivot requiring L knee block. Patient with L posterolateral lean requiring Max multimodal cueing for weight shift to midline and hand placement. Patient able to void seated on commode. Max A for clothing  management in standing. Squat-pivot back to wc with Max A and Max multimodal cues for anterior lean, foot placement, power negotiation, and pushing from base of support. Patient transported to sink with total A. Grooming seated at sink level with Min A and multimodal cueing for initiation/termination of task with perseveration noted. Patient with poor oral control of water when rinsing out mouth with episode of  choking and spillage of liquid from lips secondary to L facial droop and poor oral closure. Patient left seated in wc with belt alarm activated, call bell within reach and all needs met.   OT Evaluation Precautions/Restrictions  Precautions Precautions: Fall Precaution Comments: L neglect, hemiparesis Restrictions Weight Bearing Restrictions: No General Chart Reviewed: Yes Additional Pertinent History: alcohol, tobacco & marijuana abuse, chronic back pain Response to Previous Treatment: Patient with no complaints from previous session. Family/Caregiver Present: No  Vital Signs Therapy Vitals Temp: 98.2 F (36.8 C) Temp Source: Oral Pulse Rate: 70 Resp: 16 BP: 110/74 Patient Position (if appropriate): Sitting Oxygen Therapy SpO2: 99 % O2 Device: Room Air Pain Patient c/o 8/10 pain in low back from work related accident a few years ago. RN notified with medication provided.   Home Living/Prior Functioning Home Living Available Help at Discharge: Family, Available 24 hours/day(pt's girlfriend works from home) Type of Home: House Home Access: (pt reports "a couple steps" to get into house but someone plans to install ramp prior to his d/c) Entrance Stairs-Number of Steps: 8 STE Entrance Stairs-Rails: Left, Right(Patient reports that rails are far apart. Unable to reach both.) Home Layout: Two level, 1/2 bath on main level Alternate Level Stairs-Number of Steps: Full flight to bedroom/bathroom on 2nd floor Alternate Level Stairs-Rails: Right, Can reach both, Left Bathroom  Shower/Tub: Tub/shower unit Bathroom Toilet: Standard Bathroom Accessibility: Yes  Lives With: Significant other(girlfriend, girlfriends son (he's 14)) IADL History Homemaking Responsibilities: Yes(Shared responsibility with girlfriend.) Current License: Yes Occupation: Full time employment Type of Occupation: Administrator Prior Function Level of Independence: Independent with basic ADLs, Independent with homemaking with ambulation, Independent with gait, Independent with transfers  Able to Take Stairs?: Yes Driving: Yes Vocation: Full time employment Vocation Requirements: long distance truck driver Leisure: Hobbies-yes (Comment)(pt has an 6 y/o son he sees every other weekend, enjoys watching tv, has chihuahua dog) Comments: recently hurt back picking up something at work ~5-6 months ago ADL ADL Grooming: Minimal assistance Where Assessed-Grooming: Sitting at sink Upper Body Bathing: Moderate assistance Where Assessed-Upper Body Bathing: Edge of bed Lower Body Bathing: Maximal assistance Where Assessed-Lower Body Bathing: Edge of bed Upper Body Dressing: Moderate assistance Where Assessed-Upper Body Dressing: Edge of bed Lower Body Dressing: Maximal assistance Where Assessed-Lower Body Dressing: Edge of bed Toileting: Maximal assistance Where Assessed-Toileting: Glass blower/designer: Maximal Print production planner Method: Arts development officer: Bedside commode Vision When asked if patient wore glasses prior to admission, he states that he did not. Pt denies changes in baseline vision. Perception Perception: (slight decreased attention to L) Praxis Praxis: Intact Perception  Perception: (slight decreased attention to L) Praxis Praxis: Intact Cognition Overall Cognitive Status: Impaired/Different from baseline Arousal/Alertness: Awake/alert Orientation Level: Person;Place;Situation Person: Oriented Place: Oriented Situation: Oriented Year:  2021 Month: March Day of Week: Correct Memory: Impaired Memory Impairment: Decreased recall of new information Decreased Short Term Memory: Verbal basic Immediate Memory Recall: Sock;Blue;Bed Memory Recall Sock: Without Cue Memory Recall Blue: Without Cue Memory Recall Bed: Without Cue Attention: Sustained;Alternating;Selective Sustained Attention: Impaired Sustained Attention Impairment: Verbal basic;Functional basic Selective Attention: Impaired Selective Attention Impairment: Functional basic;Functional complex Awareness: Impaired Awareness Impairment: Anticipatory impairment Problem Solving: Impaired Problem Solving Impairment: Functional basic Executive Function: Sequencing Sequencing: Impaired Sequencing Impairment: Functional basic Behaviors: Impulsive Safety/Judgment: Impaired Comments: Patient requires cueing for safety awareness. Sensation Sensation Light Touch: Impaired Detail(unable to feel therapist touching LLE) Light Touch Impaired Details: Impaired LUE Proprioception: Impaired Detail(absent LLE) Coordination Gross Motor Movements are  Fluid and Coordinated: No Fine Motor Movements are Fluid and Coordinated: No Coordination and Movement Description: L hemiparesis Motor  Motor Motor: Abnormal postural alignment and control Motor - Skilled Clinical Observations: L hemiparesis, generalized deconditioning Mobility  Bed Mobility Bed Mobility: Supine to Sit;Rolling Right;Rolling Left Rolling Right: Moderate Assistance - Patient 50-74% Rolling Left: Minimal Assistance - Patient > 75% Supine to Sit: Supervision/Verbal cueing(bed features, compensatory technique) Transfers Sit to Stand: Maximal Assistance - Patient 25-49% Stand to Sit: Maximal Assistance - Patient 25-49%  Trunk/Postural Assessment  Cervical Assessment Cervical Assessment: Within Functional Limits Thoracic Assessment Thoracic Assessment: Within Functional Limits Postural Control Postural  Control: Deficits on evaluation Trunk Control: delayed Righting Reactions: delayed  Balance Balance Balance Assessed: Yes Static Sitting Balance Static Sitting - Balance Support: Feet supported;Right upper extremity supported Static Sitting - Level of Assistance: 5: Stand by assistance Dynamic Sitting Balance Dynamic Sitting - Balance Support: Feet supported Dynamic Sitting - Level of Assistance: (SBA<>CGA) Static Standing Balance Static Standing - Balance Support: No upper extremity supported;During functional activity Static Standing - Level of Assistance: 2: Max assist Dynamic Standing Balance Dynamic Standing - Level of Assistance: Not tested (comment) Extremity/Trunk Assessment RUE Assessment RUE Assessment: Within Functional Limits Active Range of Motion (AROM) Comments: WFL from 0-180 degrees General Strength Comments: MMT 5/5 LUE Assessment LUE Assessment: Exceptions to [facility] Passive Range of Motion (PROM) Comments: WFL 0-180 degrees shoulder flexion. WFL elbow, wrist, and digits 1-5. Active Range of Motion (AROM) Comments: Flaccid General Strength Comments: MMT 0/5 LUE Body System: Neuro Brunstrum levels for arm and hand: Arm;Hand Brunstrum level for arm: Stage I Presynergy Brunstrum level for hand: Stage I Flaccidity     Refer to Care Plan for Long Term Goals  Recommendations for other services: Neuropsych and Therapeutic Recreation  Stress management   Discharge Criteria: Patient will be discharged from OT if patient refuses treatment 3 consecutive times without medical reason, if treatment goals not met, if there is a change in medical status, if patient makes no progress towards goals or if patient is discharged from hospital.  The above assessment, treatment plan, treatment alternatives and goals were discussed and mutually agreed upon: by patient  Carrol Bondar [name] 01/16/2020, 4:43 PM

## 2020-01-16 NOTE — H&P (Signed)
Physical Medicine and Rehabilitation Admission H&P  HPI: Derek [name] is a 44 year old right-handed male with history of alcohol/tobacco/marijuana use as well as chronic back pain. History taken from chart review. Patient lives with girlfriend and independent prior to admission. He is employed as a Agricultural consultant. Presented 01/07/2020 with left hemiparesis. Blood pressure 184/110. Cranial CT scan showed right MCA infarction with mass-effect possible petechial hemorrhage in portions of the right basal ganglia. Infarcts extending to the posterior superior right frontal and adjacent right parietal lobe. Suspect thrombus/embolus in the M1 segment of right middle cerebral artery. MRI showed acute right MCA infarction involving basal ganglia and right parietal lobe. Hemorrhagic transformation of the right basal ganglia infarction with mass-effect on the lateral ventricle. Echocardiogram with ejection fraction 65%. Lower extremity Dopplers negative for DVT. Admission chemistries unremarkable except glucose 134, WBC 14,300, CK 787, blood cultures no growth to date, lactic acid within normal limits 1.9, urine culture no growth, urine drug screen positive marijuana. Neurology consulted with TEE completed 01/10/2020 showing no PFO /Thrombus. and currently maintained on aspirin 325 mg daily for CVA prophylaxis. Hold on DAPT at this time given hemorrhagic transformation. Follow-up repeat cranial CT scan 01/09/2020 evolving right MCA infarction again with hemorrhagic transformation, 3 mm right to left shift. No plan for 3% saline at this time and scan was again completed 01/10/2020 showing no new hemorrhage hydrocephalus or worsening mass-effect. Lovenox was initiated for DVT prophylaxis 01/10/2020. Patient did have a low-grade fever 100.5 leukocytosis 14,300 improved to 7.0. Urine culture no growth urinalysis negative blood cultures no growth to date. Therapy evaluations completed and patient was admitted for a  comprehensive rehab program  Pt reports L face feels swollen/tingling; LBM 2 days ago- denies constipation- voiding well.  Review of Systems  Constitutional: Positive for fever. Negative for chills.  HENT: Negative for hearing loss.  Eyes: Negative for blurred vision and double vision.  Respiratory: Negative for cough and shortness of breath.  Cardiovascular: Negative for chest pain, palpitations and leg swelling.  Gastrointestinal: Positive for constipation. Negative for heartburn, nausea and vomiting.  Genitourinary: Negative for dysuria, flank pain and hematuria.  Musculoskeletal: Positive for back pain.  Skin: Negative for rash.  Neurological: Positive for weakness.  Occasional headaches  All other systems reviewed and are negative.       Past Medical History:  Diagnosis Date  . 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 Alcohol dependence (HCC)   . Back pain   . Marijuana dependence (HCC)   . Tobacco dependence    History reviewed. No pertinent surgical history.       Family History  Problem Relation Age of Onset  . Stroke Neg Hx   . CAD Neg Hx    Social History: reports that he has been smoking cigarettes. He has been smoking about 0.20 packs per day. He has never used smokeless tobacco. He reports current alcohol use. He reports current drug use. Drug: Marijuana.  Allergies: No Known Allergies        Medications Prior to Admission  Medication Sig Dispense Refill  . ferrous sulfate 325 (65 FE) MG tablet Take 325 mg by mouth 2 (two) times daily.  2  . ibuprofen (ADVIL,MOTRIN) 200 MG tablet Take 400 mg by mouth every 6 (six) hours as needed. For pain     Drug Regimen Review  Drug regimen was reviewed and remains appropriate with no significant issues identified  Home:  Home Living  Family/patient expects to be discharged  to:: Inpatient rehab  Lives With: Significant other, Other (Comment)(children)  Functional History:  Prior Function  Level of  Independence: Independent  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[employer]  Functional Status:  Mobility:  Bed Mobility  Overal bed mobility: Needs Assistance  Bed Mobility: Supine to Sit  Supine to sit: +2 for safety/equipment, Min assist  General bed mobility comments: MinA to bring LLE over to edge of bed, PT guarding LUE, pt pulling trunk up to sitting with RUE with bed rail  Transfers  Overall transfer level: Needs assistance  Equipment used: Ambulation equipment used  Transfer via Lift Equipment: Stedy  Transfers: Sit to/from Estée LauderStand  Sit to Stand: Min assist, +2 safety/equipment, +2 physical assistance  Stand pivot transfers: +2 physical assistance, Max assist, From elevated surface  General transfer comment: MinA +2 to pull up to sitting with RUE, good initiation and hip extension activation  Ambulation/Gait  General Gait Details: unable   ADL:  ADL  Overall ADL's : Needs assistance/impaired  Eating/Feeding: NPO  Grooming: Wash/dry hands, Minimal assistance, Bed level  Grooming Details (indicate cue type and reason): pt terminates task prematurely. pt leaving wash cloth over eyes. pt states "okay" when asked to remove. Pt hears phone ring and spontaneously pulls wrap off face  Upper Body Bathing: Maximal assistance  Lower Body Bathing: Total assistance  Upper Body Dressing : Maximal assistance  Lower Body Dressing: Total assistance  Toilet Transfer: +2 for physical assistance, Maximal assistance, +2 for safety/equipment  General ADL Comments: pt with L lateral lean and lacks awarness to L deficits  Cognition:  Cognition  Overall Cognitive Status: Impaired/Different from baseline  Arousal/Alertness: Lethargic  Orientation Level: Oriented X4  Attention: Sustained  Sustained Attention: Appears intact  Memory: Impaired  Memory Impairment: Decreased short term memory  Decreased Short Term Memory: Verbal basic  Immediate Memory Recall: Sock, Blue, Bed  Memory Recall Sock: Without Cue    Memory Recall Blue: With Cue  Memory Recall Bed: With Cue  Awareness: Appears intact  Problem Solving: Impaired  Problem Solving Impairment: Verbal complex  Safety/Judgment: Appears intact  Cognition  Arousal/Alertness: Lethargic  Behavior During Therapy: Flat affect  Overall Cognitive Status: Impaired/Different from baseline  Area of Impairment: Awareness, Safety/judgement  Safety/Judgement: Decreased awareness of safety, Decreased awareness of deficits  Awareness: Emergent  General Comments: Pt A&Ox4 today, still mildly drowsy, but able to follow all 1 step commands. Emerging awareness of deficits, asking, "how did this happen to someone my age?"  Difficult to assess due to: Level of arousal  Physical Exam:  Blood pressure 119/71, pulse 69, temperature 98.7 F (37.1 C), temperature source Oral, resp. rate (!) 21, height 6\' 2"  (1.88 m), weight 99.8 kg, SpO2 99 %.  Physical Exam  Nursing note and vitals reviewed.  Constitutional:  Awake, alert, sitting up in recliner at bedside, irritable about insurance information/slow insurance approval, NAD  HENT:  Head: Normocephalic and atraumatic.  Nose: Nose normal.  Mouth/Throat: Oropharynx is clear and moist. No oropharyngeal exudate.  L facial droop noted; L tongue deviation L face decreased sensation/numb  Eyes: Conjunctivae are normal. Right eye exhibits no discharge. Left eye exhibits no discharge.  EOMs intact; a little nystagmus to L, not to R  Neck: No tracheal deviation present.  Cardiovascular: Normal rate, regular rhythm and normal heart sounds.  No murmur heard.  Respiratory: No stridor.  CTA B/L- no W/R/R  GI:  Soft, NT, ND, (+)BS hypoactive  Musculoskeletal:  Cervical back: Normal range of motion and neck  supple.  Comments: RUE and RLE Deltoid, biceps, triceps, WE, grip, finger abd, HF, KE, KF, DF and PF- all 5/5  LUE and LLE- pt refused to try and move- said 0/5- didn't see any spontaneous movement of L side while  in room.  Neurological: He has normal reflexes. He exhibits normal muscle tone. Coordination abnormal.  Patient is alert sitting up in bed. Mood is a bit flat but appropriate. Makes eye contact with examiner. Follows commands. Provides his name and age. Fair insight and awareness of deficits.  Skin: Skin is warm and dry.  No skin breakdown seen  Psychiatric:  Irritable- refused to answer questions since "already has before".   Lab Results Last 48 Hours        Results for orders placed or performed during the hospital encounter of 01/07/20 (from the past 48 hour(s))  Lupus anticoagulant panel Status: None   Collection Time: 01/08/20 10:28 AM  Result Value Ref Range   PTT Lupus Anticoagulant 32.7 0.0 - 51.9 sec   DRVVT 36.7 0.0 - 47.0 sec   Lupus Anticoag Interp Comment:     Comment: (NOTE)  No lupus anticoagulant was detected.  Performed At: [facility]  [address] [place], [place] [id number]  Jolene Schimke MD OF:[id number]   Beta-2-glycoprotein i abs, IgG/M/A Status: None   Collection Time: 01/08/20 10:28 AM  Result Value Ref Range   Beta-2 Glyco I IgG <9 0 - 20 GPI IgG units    Comment: (NOTE)  The reference interval reflects a 3SD or 99th percentile interval,  which is thought to represent a potentially clinically significant  result in accordance with the International Consensus Statement on  the classification criteria for definitive antiphospholipid  syndrome (APS). J Thromb Haem 2006;4:295-306.    Beta-2-Glycoprotein I IgM <9 0 - 32 GPI IgM units    Comment: (NOTE)  The reference interval reflects a 3SD or 99th percentile interval,  which is thought to represent a potentially clinically significant  result in accordance with the International Consensus Statement on  the classification criteria for definitive antiphospholipid  syndrome (APS). J Thromb Haem 2006;4:295-306.  Performed At: [facility]  [address] [place], [place] [id number]   Jolene Schimke MD RW:[id number]    Beta-2-Glycoprotein I IgA <9 0 - 25 GPI IgA units    Comment: (NOTE)  The reference interval reflects a 3SD or 99th percentile interval,  which is thought to represent a potentially clinically significant  result in accordance with the International Consensus Statement on  the classification criteria for definitive antiphospholipid  syndrome (APS). J Thromb Haem 2006;4:295-306.   Homocysteine, serum Status: None   Collection Time: 01/08/20 10:28 AM  Result Value Ref Range   Homocysteine 13.9 0.0 - 14.5 umol/L    Comment: (NOTE)  Performed At: [facility]  [address] [place], [place] [id number]  Jolene Schimke MD ZT:[id number]   Cardiolipin antibodies, IgG, IgM, IgA Status: None   Collection Time: 01/08/20 10:28 AM  Result Value Ref Range   Anticardiolipin IgG <9 0 - 14 GPL U/mL    Comment: (NOTE)  Negative: <15  Indeterminate: 15 - 20  Low-Med Positive: >20 - 80  High Positive: >80    Anticardiolipin IgM <9 0 - 12 MPL U/mL    Comment: (NOTE)  Negative: <13  Indeterminate: 13 - 20  Low-Med Positive: >20 - 80  High Positive: >80    Anticardiolipin IgA <9 0 - 11 APL U/mL    Comment: (NOTE)  Negative: <12  Indeterminate: 12 - 20  Low-Med Positive: >20 - 80  High Positive: >80  Performed At: [facility]  [address] [place], [place] [id number]  Jolene Schimke MD YB:[id number]   ANA, IFA (with reflex) Status: None   Collection Time: 01/08/20 10:28 AM  Result Value Ref Range   ANA Ab, IFA Negative     Comment: (NOTE)  Negative <1:80  Borderline 1:80  Positive >1:80  Performed At: [facility]  [address] [place], [place] [id number]  Jolene Schimke MD TI:[id number]   Vitamin B12 Status: None   Collection Time: 01/08/20 10:28 AM  Result Value Ref Range   Vitamin B-12 362 180 - 914 pg/mL    Comment: (NOTE)  This assay is not validated for testing neonatal or  myeloproliferative syndrome specimens  for Vitamin B12 levels.  Performed at [facility], [address] [place], [place]  [address]   RPR Status: None   Collection Time: 01/08/20 10:28 AM  Result Value Ref Range   RPR Ser Ql NON REACTIVE NON REACTIVE    Comment: Performed at [facility], [address], [place], [place] [address]  CBC Status: Abnormal   Collection Time: 01/09/20 4:29 AM  Result Value Ref Range   WBC 9.2 4.0 - 10.5 K/uL   RBC 4.81 4.22 - 5.81 MIL/uL   Hemoglobin 14.2 13.0 - 17.0 g/dL   HCT 67.1 24.5 - 80.9 %   MCV 88.1 80.0 - 100.0 fL   MCH 29.5 26.0 - 34.0 pg   MCHC 33.5 30.0 - 36.0 g/dL   RDW 98.3 (H) 38.2 - 50.5 %   Platelets 256 150 - 400 K/uL   nRBC 0.0 0.0 - 0.2 %    Comment: Performed at [facility], [address] [place], [place] [address]  Basic metabolic panel Status: Abnormal   Collection Time: 01/09/20 4:29 AM  Result Value Ref Range   Sodium 140 135 - 145 mmol/L   Potassium 3.4 (L) 3.5 - 5.1 mmol/L   Chloride 107 98 - 111 mmol/L   CO2 24 22 - 32 mmol/L   Glucose, Bld 98 70 - 99 mg/dL    Comment: Glucose reference range applies only to samples taken after fasting for at least 8 hours.   BUN 9 6 - 20 mg/dL   Creatinine, Ser 3.41 0.61 - 1.24 mg/dL   Calcium 8.9 8.9 - 93.7 mg/dL   GFR calc non Af Amer >60 >60 mL/min   GFR calc Af Amer >60 >60 mL/min   Anion gap 9 5 - 15    Comment: Performed at [facility], [address], [place], [place] [address]  CBC with Differential/Platelet Status: Abnormal   Collection Time: 01/10/20 5:17 AM  Result Value Ref Range   WBC 9.0 4.0 - 10.5 K/uL   RBC 4.67 4.22 - 5.81 MIL/uL   Hemoglobin 13.8 13.0 - 17.0 g/dL   HCT 97.3 53.2 - 99.2 %   MCV 87.8 80.0 - 100.0 fL   MCH 29.6 26.0 - 34.0 pg   MCHC 33.7 30.0 - 36.0 g/dL   RDW 42.6 (H) 83.4 - 19.6 %   Platelets 233 150 - 400 K/uL   nRBC 0.0 0.0 - 0.2 %   Neutrophils Relative % 65 %   Neutro Abs 5.9 1.7 - 7.7 K/uL   Lymphocytes Relative 21 %   Lymphs Abs 1.9 0.7 -  4.0 K/uL   Monocytes Relative  11 %   Monocytes Absolute 1.0 0.1 - 1.0 K/uL   Eosinophils Relative 1 %   Eosinophils Absolute 0.1 0.0 - 0.5 K/uL   Basophils Relative 1 %   Basophils Absolute 0.1 0.0 - 0.1 K/uL   Immature Granulocytes 1 %   Abs Immature Granulocytes 0.05 0.00 - 0.07 K/uL    Comment: Performed at [facility] [address] [place], [place] [address]   Imaging Results (Last 48 hours)    Medical Problem List and Plan:  1. 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 Status post TEE 01/10/2020  -patient may shower  -ELOS/Goals: 2-3 weeks; supervision to min assist  2. Antithrombotics:  -DVT/anticoagulation: Lovenox initiated 01/10/2020  -antiplatelet therapy: Aspirin 325 mg daily  3. Pain Management: Lidoderm patch, oxycodone 5 mg every 6 hours as needed  4. Mood: Advised emotional support  -antipsychotic agents: N/A  5. Neuropsych: This patient is capable of making decisions on his own behalf.  6. Skin/Wound Care: Routine skin checks  7. Fluids/Electrolytes/Nutrition: Routine in and outs with follow-up chemistries  8. Dysphagia. Dysphagia #2 nectar liquids. Follow-up speech therapy  9. Permissive hypertension. Norvasc 10 mg daily. Monitor with increased mobility  10. Hyperlipidemia. Lipitor  11. History of tobacco/alcohol/marijuana use. NicoDerm patch. Monitor for any signs of withdrawal provide counseling  Cathlyn Parsons, PA-C  01/10/2020  I have personally performed a face to face diagnostic evaluation of this patient and formulated the key components of the plan. Additionally, I have personally reviewed laboratory data, imaging studies, as well as relevant notes and concur with the physician assistant's documentation above.  The patient's status has not changed from the original H&P. Any changes in documentation from the acute care chart have been noted above.

## 2020-01-17 ENCOUNTER — Inpatient Hospital Stay (HOSPITAL_COMMUNITY): Payer: BC Managed Care – PPO | Admitting: Occupational Therapy

## 2020-01-17 ENCOUNTER — Inpatient Hospital Stay (HOSPITAL_COMMUNITY): Payer: BC Managed Care – PPO | Admitting: Physical Therapy

## 2020-01-17 ENCOUNTER — Inpatient Hospital Stay (HOSPITAL_COMMUNITY): Payer: BC Managed Care – PPO | Admitting: Speech Pathology

## 2020-01-17 ENCOUNTER — Other Ambulatory Visit: Payer: Self-pay | Admitting: Cardiology

## 2020-01-17 DIAGNOSIS — I63311 Cerebral infarction due to thrombosis of right middle cerebral artery: Secondary | ICD-10-CM

## 2020-01-17 NOTE — Progress Notes (Signed)
[place] PHYSICAL MEDICINE & REHABILITATION PROGRESS NOTE   Subjective/Complaints: Had a fair night. Still with spasms in left leg. Didn't wear PRAFO as he thought he was supposed to wear in day time  ROS: Patient denies fever, rash, sore throat, blurred vision, nausea, vomiting, diarrhea, cough, shortness of breath or chest pain, joint or back pain, headache, or mood change.     Objective:   No results found. Recent Labs    01/15/20 2128 01/16/20 0512  WBC 6.6 6.1  HGB 14.7 14.6  HCT 43.3 44.2  PLT 311 313   Recent Labs    01/15/20 2128 01/16/20 0512  NA  --  141  K  --  3.7  CL  --  103  CO2  --  26  GLUCOSE  --  97  BUN  --  16  CREATININE 1.05 1.07  CALCIUM  --  9.3    Intake/Output Summary (Last 24 hours) at 01/17/2020 1003 Last data filed at 01/17/2020 0900 Gross per 24 hour  Intake 720 ml  Output 750 ml  Net -30 ml     Physical Exam: Vital Signs Blood pressure 109/81, pulse 72, temperature 98.8 F (37.1 C), resp. rate 19, height 6\' 1"  (1.854 m), weight 90.2 kg, SpO2 99 %.   Constitutional: No distress . Vital signs reviewed. HEENT: EOMI, oral membranes moist Neck: supple Cardiovascular: RRR without murmur. No JVD    Respiratory/Chest: CTA Bilaterally without wheezes or rales. Normal effort    GI/Abdomen: BS +, non-tender, non-distended Ext: no clubbing, cyanosis, or edema Skin: Clean and intact without signs of breakdown Neuro: left inattention, left cn7, LUE and LLE 0/5. Senses pain on left side. No resting tone LUE. LLE with extensor spasms and clonus with PROM again, somewhat painful. Fair insight and awareness. Alert and oriented x 3.  Musculoskeletal: Full PROM, No pain PROM in the neck, trunk, or extremities. Posture appropriate Psych: Pt's affect is flat but cooperative     Assessment/Plan: 1. Functional deficits secondary to right MCA infarct which require 3+ hours per day of interdisciplinary therapy in a comprehensive inpatient rehab  setting.  Physiatrist is providing close team supervision and 24 hour management of active medical problems listed below.  Physiatrist and rehab team continue to assess barriers to discharge/monitor patient progress toward functional and medical goals  Care Tool:  Bathing    Body parts bathed by patient: Right arm, Chest, Abdomen, Front perineal area, Buttocks, Right upper leg, Left upper leg, Face   Body parts bathed by helper: Left arm, Right lower leg     Bathing assist Assist Level: Maximal Assistance - Patient 24 - 49%     Upper Body Dressing/Undressing Upper body dressing   What is the patient wearing?: Pull over shirt    Upper body assist Assist Level: Moderate Assistance - Patient 50 - 74%    Lower Body Dressing/Undressing Lower body dressing      What is the patient wearing?: Pants, Underwear/pull up     Lower body assist Assist for lower body dressing: Maximal Assistance - Patient 25 - 49%     Toileting Toileting    Toileting assist Assist for toileting: Set up assist(with urinal)     Transfers Chair/bed transfer  Transfers assist     Chair/bed transfer assist level: Maximal Assistance - Patient 25 - 49%     Locomotion Ambulation   Ambulation assist   Ambulation activity did not occur: Safety/medical concerns  Walk 10 feet activity   Assist  Walk 10 feet activity did not occur: Safety/medical concerns        Walk 50 feet activity   Assist Walk 50 feet with 2 turns activity did not occur: Safety/medical concerns         Walk 150 feet activity   Assist Walk 150 feet activity did not occur: Safety/medical concerns         Walk 10 feet on uneven surface  activity   Assist Walk 10 feet on uneven surfaces activity did not occur: Safety/medical concerns         Wheelchair     Assist Will patient use wheelchair at discharge?: Yes Type of Wheelchair: Manual    Wheelchair assist level: Moderate  Assistance - Patient 50 - 74% Max wheelchair distance: 150 ft    Wheelchair 50 feet with 2 turns activity    Assist        Assist Level: Moderate Assistance - Patient 50 - 74%   Wheelchair 150 feet activity     Assist      Assist Level: Moderate Assistance - Patient 50 - 74%   Blood pressure 109/81, pulse 72, temperature 98.8 F (37.1 C), resp. rate 19, height 6\' 1"  (1.854 m), weight 90.2 kg, SpO2 99 %.  Medical Problem List and Plan: 1.  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  Status post TEE 01/10/2020             -patient may shower             -ELOS/Goals: 2-3 weeks; supervision to min assist  -Patient continues CIR therapies including PT and OT SLP  -PRAFO/WHO ordered---educated him to wear at night 2.  Antithrombotics: -DVT/anticoagulation: Lovenox initiated 01/10/2020  -recent dopplers negative             -antiplatelet therapy: Aspirin 325 mg daily 3. Pain Management: Lidoderm patch, oxycodone 5 mg every 6 hours as needed 4. Mood: Advised emotional support  -neuropsych             -antipsychotic agents: N/A 5. Neuropsych: This patient is capable of making decisions on his own behalf. 6. Skin/Wound Care: Routine skin checks 7. Fluids/Electrolytes/Nutrition: encourage PO  -continue to monitor 8.  Dysphagia.  Dysphagia #2 nectar liquids.  Follow-up speech therapy 9.  Permissive hypertension.  Norvasc 10 mg daily.  Monitor with increased mobility 10.  Hyperlipidemia.  Lipitor 11.  History of tobacco/alcohol/marijuana use.  NicoDerm patch.  Monitor for any signs of withdrawal provide counseling 12. Early extensor tone LLE:  -begin trial of baclofen 5mg  bid---tolerating thus far   -PRAFO  LOS: 2 days A FACE TO FACE EVALUATION WAS PERFORMED  03/11/2020 01/17/2020, 10:03 AM

## 2020-01-17 NOTE — Progress Notes (Signed)
Occupational Therapy Session Note  Patient Details  Name: Derek [name] MRN: [id number] Date of Birth: Aug 26, 1976  Today's Date: 01/17/2020 OT Individual Time: 1130-1200 OT Individual Time Calculation (min): 30 min    Short Term Goals: Week 1:  OT Short Term Goal 1 (Week 1): Patient will don UB clothing with Min A and 1-3 cues for sequencing. OT Short Term Goal 2 (Week 1): Patient will complete supine <> EOB with Min A in prep for completion of ADLs OT Short Term Goal 3 (Week 1): Patient will complete sit to stand transfers with Mod A and DME as needed in prep for completion of ADLs.  Skilled Therapeutic Interventions/Progress Updates:    1:1. Pt received in w/c with no pain reported. Pt educated on estim saebo use and NMR/incorporation of functional activies. Pt able to feel stimulation from saebo. Pt completes gross grasp/release with total A to facilitate/coordinate on cup for NMR with edu re visualization of LUE for L attention. Pt completes towel glides with total A and scapular input for tactile input/weight bearing through LUE on tabletop. Exited session with saebo on in w/c with belt alarm on and call light in reach  (30 min attended with activity-20 min unattended) Saebo Stim One 330 pulse width 35 Hz pulse rate On 8 sec/ off 8 sec Ramp up/ down 2 sec Symmetrical Biphasic wave form  Max intensity at 500 Ohm load   Therapy Documentation Precautions:  Precautions Precautions: Fall Precaution Comments: L neglect, hemiparesis Required Braces or Orthoses: Other Brace Other Brace: L knee immobilizer Restrictions Weight Bearing Restrictions: No General:   Vital Signs:   Pain:   ADL: ADL Grooming: Minimal assistance Where Assessed-Grooming: Sitting at sink Upper Body Bathing: Moderate assistance Where Assessed-Upper Body Bathing: Edge of bed Lower Body Bathing: Maximal assistance Where Assessed-Lower Body Bathing: Edge of bed Upper Body Dressing: Moderate  assistance Where Assessed-Upper Body Dressing: Edge of bed Lower Body Dressing: Maximal assistance Where Assessed-Lower Body Dressing: Edge of bed Toileting: Maximal assistance Where Assessed-Toileting: Teacher, adult education: Maximal Dentist Method: Surveyor, minerals: Tourist information centre manager    Praxis   Exercises:   Other Treatments:     Therapy/Group: Individual Therapy  Shon Hale 01/17/2020, 12:02 PM

## 2020-01-17 NOTE — Progress Notes (Signed)
.  ev

## 2020-01-17 NOTE — Progress Notes (Addendum)
Physical Therapy Session Note  Patient Details  Name: Derek [name] MRN: [id number] Date of Birth: 1975-11-12  Today's Date: 01/17/2020 PT Individual Time: 5366-4403 AND 1500-1527 PT Individual Time Calculation (min): 70 min AND 27 min  Short Term Goals: Week 1:  PT Short Term Goal 1 (Week 1): Pt will consistently complete bed<>w/c transfers with mod assist. PT Short Term Goal 2 (Week 1): Pt will propel w/c 75 ft with min assist. PT Short Term Goal 3 (Week 1): Pt will initiate pre-gait activities with max assist +1.  Skilled Therapeutic Interventions/Progress Updates:   Session 1:  Pt in supine upon arrival, agreeable to therapy and denies pain. Supine>sit w/ mod assist and max assist stand pivot to w/c. Instructed pt in w/c mobility via R hemi technique. Therapist provided min assist to allow to pt to coordinate movements w/ RUE and RLE w/o having to steer w/c. Worked on sit<>stands to rail w/ mod assist to boost and mod-max assist to maintain static standing while working on L quad activation. Therapist providing total assist to keep LLE into extension while pt preformed R forward and backward stepping, and lateral weight shifting. Trace palpable quad contraction felt ~10% of the time. Transitioned to working on LLE activation in supine, performing supine bridge and assisted knee-to-chest in attempts to activate any musculature in LLE w/o success. Applied e-stim to L quad musculature in seated w/ palpable muscle contraction and slight LLE lift off floor, therapist assisting remainder of way to reach full LAQ. Performed x10 min w/ verbal and tactile cues for pt to actively attempt contraction for neuromuscular re-education. Pt able to sense contraction "coming on" but unable to feel stimulation. Pt tolerated e-stim well, skin intact, see parameters below. Worked on dynamic sitting balance remainder of session w/ LUE and LLE in WB while in seated. Used RUE to reach to fair R and L sides w/  CGA-close supervision while pinning/unpinning clothespins. Verbal and tactile cues to avoid trunk compensations for balance. Pt self-propelled w/c back to room w/ supervision-min assist, good carryover from beginning of session. Ended session in w/c, all needs in reach.   Saebo Stim One 330 pulse width 35 Hz pulse rate On 8 sec/ off 8 sec Ramp up/ down 2 sec Symmetrical Biphasic wave form  Max intensity at 500 Ohm load  Session 2:  Pt in w/c and agreeable to therapy, denies pain. Total assist w/c transport to/from therapy gym. Stand pivot to NuStep w/ max assist. Pt w/ LOB to L, max assist to correct and safely sit in NuStep seat. Performed NuStep 8 min @ level 1 w/ BLEs and RUE to work on LLE muscle activation and reciprocal movement pattern. Emphasized visualizing LLE moving to facilitate neuroplastic changes. Therapist fixed pt's w/c as it was dumped posteriorly, while pt was on NuStep. Returned to room via w/c, mod assist stand pivot to EOB. Ended session in supine, all needs in reach.   Therapy Documentation Precautions:  Precautions Precautions: Fall Precaution Comments: L neglect, hemiparesis Required Braces or Orthoses: Other Brace Other Brace: L knee immobilizer Restrictions Weight Bearing Restrictions: No Pain: Pain Assessment Pain Scale: 0-10 Pain Score: 5  Pain Type: Chronic pain Pain Location: Back Pain Orientation: Lower Pain Descriptors / Indicators: Aching  Therapy/Group: Individual Therapy  Aidaly Cordner [name] 01/17/2020, 11:53 AM

## 2020-01-17 NOTE — Progress Notes (Signed)
Speech Language Pathology Daily Session Note  Patient Details  Name: Derek [name] MRN: [id number] Date of Birth: 08/21/1976  Today's Date: 01/17/2020 SLP Individual Time: 9447-3958 SLP Individual Time Calculation (min): 25 min  Short Term Goals: Week 1: SLP Short Term Goal 1 (Week 1): Patient will consume regular textures with thin liquids with minimal overt s/s of aspiration and Mod I for use of swallowing compensatory strategies. SLP Short Term Goal 2 (Week 1): Patient will demonstrate sustained attention to functional tasks for 15 minutes with Min verbal cues for redirection. SLP Short Term Goal 3 (Week 1): Patient will recall new, daily information with Mod verbal cues. SLP Short Term Goal 4 (Week 1): Patient will identify 2 cognitive deficits with Mod verbal cues. SLP Short Term Goal 5 (Week 1): Patient will demonstrate functional problem solving for basic and familiar tasks with Min verbal cues.  Skilled Therapeutic Interventions: Skilled treatment session focused on cognitive goals. SLP facilitated session by providing Max A verbal cues for problem solving while attempting to use the urinal. Patient was continent of urine but required Mod verbal cues for sustained attention to task. SLP also provided Min verbal cues for problem solving with tray set-up. Patient left upright in bed with alarm on and all needs within reach. Continue with current plan of care.       Pain No/Denies Pain   Therapy/Group: Individual Therapy  Fischer Halley 01/17/2020, 3:00 PM

## 2020-01-17 NOTE — Progress Notes (Signed)
Occupational Therapy Session Note  Patient Details  Name: Derek [name] MRN: [id number] Date of Birth: Jul 22, 1976  Today's Date: 01/17/2020 OT Individual Time: 1300-1400 OT Individual Time Calculation (min): 60 min    Short Term Goals: Week 1:  OT Short Term Goal 1 (Week 1): Patient will don UB clothing with Min A and 1-3 cues for sequencing. OT Short Term Goal 2 (Week 1): Patient will complete supine <> EOB with Min A in prep for completion of ADLs OT Short Term Goal 3 (Week 1): Patient will complete sit to stand transfers with Mod A and DME as needed in prep for completion of ADLs.  Skilled Therapeutic Interventions/Progress Updates:  Patient met seated in wc in agreement with OT treatment session. 0/10 pain at rest and with activity. Skilled OT treatment session with focus on functional transfers and self-care re-education with use of LUE as a stabilizer during ADLs as detailed below. Patient engaged in UB/LB bathing seated at sink level with vc's for recall of hemi-dressing techniques. Sit to stand from wc at sink level with multimodal cues for hand placement, pushing from base of support, power negotiation, and L attention requiring L knee block and  Mod A. With assistance, patient able to achieve figure-4 position for LB bathing/dressing with additional assistance required for maintaining position. Patient states that his L leg doesn't feel like it belongs to his body. Oral hygiene seated at sink level with Min A and education with hand over hand assist for stabilizing hemiparetic LUE on sink surface. Patient left seated in wc with call bell within reach and belt alarm activated.   Therapy Documentation Precautions:  Precautions Precautions: Fall Precaution Comments: L neglect, hemiparesis Required Braces or Orthoses: Other Brace Other Brace: L knee immobilizer Restrictions Weight Bearing Restrictions: No  Therapy/Group: Individual Therapy  Haani Bakula [name] 01/17/2020,  1:58 PM

## 2020-01-18 ENCOUNTER — Inpatient Hospital Stay (HOSPITAL_COMMUNITY): Payer: BC Managed Care – PPO | Admitting: Occupational Therapy

## 2020-01-18 ENCOUNTER — Inpatient Hospital Stay (HOSPITAL_COMMUNITY): Payer: BC Managed Care – PPO

## 2020-01-18 ENCOUNTER — Inpatient Hospital Stay (HOSPITAL_COMMUNITY): Payer: BC Managed Care – PPO | Admitting: Speech Pathology

## 2020-01-18 ENCOUNTER — Inpatient Hospital Stay (HOSPITAL_COMMUNITY): Payer: BC Managed Care – PPO | Admitting: Physical Therapy

## 2020-01-18 NOTE — Progress Notes (Signed)
Speech Language Pathology Daily Session Note  Patient Details  Name: RAMIL EDGINGTON MRN: [id number] Date of Birth: [birth year]  Today's Date: 01/18/2020 SLP Individual Time: 1119-1200 SLP Individual Time Calculation (min): 41 min  Short Term Goals: Week 1: SLP Short Term Goal 1 (Week 1): Patient will consume regular textures with thin liquids with minimal overt s/s of aspiration and Mod I for use of swallowing compensatory strategies. SLP Short Term Goal 2 (Week 1): Patient will demonstrate sustained attention to functional tasks for 15 minutes with Min verbal cues for redirection. SLP Short Term Goal 3 (Week 1): Patient will recall new, daily information with Mod verbal cues. SLP Short Term Goal 4 (Week 1): Patient will identify 2 cognitive deficits with Mod verbal cues. SLP Short Term Goal 5 (Week 1): Patient will demonstrate functional problem solving for basic and familiar tasks with Min verbal cues.  Skilled Therapeutic Interventions: Pt was seen for skilled ST targeting cognitive skills. Pt required Max A for details of encounter with ST yesterday, and identification of cognitive goals. Mod A verbal and visual cues were required for problem solving and error awareness within basic money management tasks from the [facility]. Pt's accuracy increased when implementing strategy to verbally problem solve prior to manipulating the money, however consistent cues required for pt to continue to implement strategy. Pt did demonstrate good insight in the level of difficulty this task presented him today.  Pt left sitting in wheelchair with alarm set and needs within reach. Continue per current plan of care.        Pain Pain Assessment Pain Scale: 0-10 Pain Score: 0-No pain  Therapy/Group: Individual Therapy  Little Ishikawa 01/18/2020, 7:26 AM

## 2020-01-18 NOTE — Progress Notes (Signed)
Occupational Therapy Session Note  Patient Details  Name: Derek [name] MRN: [id number] Date of Birth: 1976/04/12  Today's Date: 01/18/2020 OT Individual Time: 9485-4627 OT Individual Time Calculation (min): 30 min    Short Term Goals: Week 1:  OT Short Term Goal 1 (Week 1): Patient will don UB clothing with Min A and 1-3 cues for sequencing. OT Short Term Goal 2 (Week 1): Patient will complete supine <> EOB with Min A in prep for completion of ADLs OT Short Term Goal 3 (Week 1): Patient will complete sit to stand transfers with Mod A and DME as needed in prep for completion of ADLs.  Skilled Therapeutic Interventions/Progress Updates:    1:1. Pt received in bed agreeable to OT. No pain reported but needs to toilet. Pt completes sit to stand in stedy with CGA and VC for weight shifting to R. Stedy to transfer to toilet and ottal A for toileting but able to maintain balance with no physical touching. Pt completes hand washing with HOH A to incorporate LUE. Saebo applied for 1 hour unattended to stimulate L deltoid with good visible contraction. Exited session with pt seated in w/c, call light tin reach and belt alarm on  Saebo Stim One 330 pulse width 35 Hz pulse rate On 8 sec/ off 8 sec Ramp up/ down 2 sec Symmetrical Biphasic wave form  Max intensity at 500 Ohm load Skin in tact at end of session. Pt tolerated well.   Therapy Documentation Precautions:  Precautions Precautions: Fall Precaution Comments: L neglect, hemiparesis Required Braces or Orthoses: Other Brace Other Brace: L knee immobilizer Restrictions Weight Bearing Restrictions: No General:   Vital Signs:   Pain: Pain Assessment Pain Scale: 0-10 Pain Score: 0-No pain ADL: ADL Grooming: Minimal assistance Where Assessed-Grooming: Sitting at sink Upper Body Bathing: Moderate assistance Where Assessed-Upper Body Bathing: Edge of bed Lower Body Bathing: Maximal assistance Where Assessed-Lower Body  Bathing: Edge of bed Upper Body Dressing: Moderate assistance Where Assessed-Upper Body Dressing: Edge of bed Lower Body Dressing: Maximal assistance Where Assessed-Lower Body Dressing: Edge of bed Toileting: Maximal assistance Where Assessed-Toileting: Teacher, adult education: Maximal Dentist Method: Surveyor, minerals: Tourist information centre manager    Praxis   Exercises:   Other Treatments:     Therapy/Group: Individual Therapy  Shon Hale 01/18/2020, 10:45 AM

## 2020-01-18 NOTE — Progress Notes (Signed)
[facility] PHYSICAL MEDICINE & REHABILITATION PROGRESS NOTE   Subjective/Complaints: Up at sink shaving. Says that spasms are better in left leg. Slept well.   ROS: Patient denies fever, rash, sore throat, blurred vision, nausea, vomiting, diarrhea, cough, shortness of breath or chest pain, joint or back pain, headache, or mood change.    Objective:   No results found. Recent Labs    01/15/20 2128 01/16/20 0512  WBC 6.6 6.1  HGB 14.7 14.6  HCT 43.3 44.2  PLT 311 313   Recent Labs    01/15/20 2128 01/16/20 0512  NA  --  141  K  --  3.7  CL  --  103  CO2  --  26  GLUCOSE  --  97  BUN  --  16  CREATININE 1.05 1.07  CALCIUM  --  9.3    Intake/Output Summary (Last 24 hours) at 01/18/2020 1207 Last data filed at 01/18/2020 0837 Gross per 24 hour  Intake 580 ml  Output --  Net 580 ml     Physical Exam: Vital Signs Blood pressure 122/75, pulse 67, temperature 97.8 F (36.6 C), resp. rate 17, height 6\' 1"  (1.854 m), weight 90.2 kg, SpO2 99 %.   Constitutional: No distress . Vital signs reviewed. HEENT: EOMI, oral membranes moist Neck: supple Cardiovascular: RRR without murmur. No JVD    Respiratory/Chest: CTA Bilaterally without wheezes or rales. Normal effort    GI/Abdomen: BS +, non-tender, non-distended Ext: no clubbing, cyanosis, or edema Skin: Clean and intact without signs of breakdown Neuro: left inattention, left cn7, LUE and LLE 0/5. Senses pain on left side. No resting tone LUE. LLE with extensor spasms and sl tone, improved today. Fair insight and awareness. Alert and oriented x 3.  Musculoskeletal: Full PROM, No pain PROM in the neck, trunk, or extremities. Posture appropriate Psych: flat, did smile, laugh some this am     Assessment/Plan: 1. Functional deficits secondary to right MCA infarct which require 3+ hours per day of interdisciplinary therapy in a comprehensive inpatient rehab setting.  Physiatrist is providing close team supervision and 24  hour management of active medical problems listed below.  Physiatrist and rehab team continue to assess barriers to discharge/monitor patient progress toward functional and medical goals  Care Tool:  Bathing    Body parts bathed by patient: Right arm, Chest, Abdomen, Front perineal area, Right upper leg, Left upper leg, Face, Right lower leg, Left lower leg   Body parts bathed by helper: Buttocks, Right arm     Bathing assist Assist Level: Maximal Assistance - Patient 24 - 49%     Upper Body Dressing/Undressing Upper body dressing   What is the patient wearing?: Pull over shirt    Upper body assist Assist Level: Moderate Assistance - Patient 50 - 74%    Lower Body Dressing/Undressing Lower body dressing      What is the patient wearing?: Pants, Underwear/pull up     Lower body assist Assist for lower body dressing: Maximal Assistance - Patient 25 - 49%     Toileting Toileting    Toileting assist Assist for toileting: Set up assist Assistive Device Comment: urinal   Transfers Chair/bed transfer  Transfers assist     Chair/bed transfer assist level: Moderate Assistance - Patient 50 - 74%(Squat-pivot transfer to R)     Locomotion Ambulation   Ambulation assist   Ambulation activity did not occur: Safety/medical concerns          Walk 10 feet  activity   Assist  Walk 10 feet activity did not occur: Safety/medical concerns        Walk 50 feet activity   Assist Walk 50 feet with 2 turns activity did not occur: Safety/medical concerns         Walk 150 feet activity   Assist Walk 150 feet activity did not occur: Safety/medical concerns         Walk 10 feet on uneven surface  activity   Assist Walk 10 feet on uneven surfaces activity did not occur: Safety/medical concerns         Wheelchair     Assist Will patient use wheelchair at discharge?: Yes Type of Wheelchair: Manual    Wheelchair assist level: Minimal Assistance -  Patient > 75% Max wheelchair distance: 150 ft    Wheelchair 50 feet with 2 turns activity    Assist        Assist Level: Minimal Assistance - Patient > 75%   Wheelchair 150 feet activity     Assist      Assist Level: Minimal Assistance - Patient > 75%   Blood pressure 122/75, pulse 67, temperature 97.8 F (36.6 C), resp. rate 17, height 6\' 1"  (1.854 m), weight 90.2 kg, SpO2 99 %.  Medical Problem List and Plan: 1.  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  Status post TEE 01/10/2020             -patient may shower             -ELOS/Goals: 2-3 weeks; supervision to min assist  -Patient continues CIR therapies including PT and OT SLP  -PRAFO/WHO at HS 2.  Antithrombotics: -DVT/anticoagulation: Lovenox initiated 01/10/2020  -recent dopplers negative             -antiplatelet therapy: Aspirin 325 mg daily 3. Pain Management: Lidoderm patch, oxycodone 5 mg every 6 hours as needed 4. Mood: Advised emotional support  -neuropsych consults  -pt denies depression at this time             -antipsychotic agents: N/A 5. Neuropsych: This patient is capable of making decisions on his own behalf. 6. Skin/Wound Care: Routine skin checks 7. Fluids/Electrolytes/Nutrition: encourage PO  -continue to monitor 8.  Dysphagia.  Dysphagia #2 nectar liquids.  Follow-up speech therapy 9.  Permissive hypertension.  Norvasc 10 mg daily.   -avoid over-treatment of bp 10.  Hyperlipidemia.  Lipitor 11.  History of tobacco/alcohol/marijuana use.  NicoDerm patch.  Monitor for any signs of withdrawal provide counseling 12. Early extensor tone LLE:  -continue trial of baclofen 5mg  bid---tolerating thus far. No need to increase yet   -PRAFO  LOS: 3 days A FACE TO FACE EVALUATION WAS PERFORMED  03/11/2020 01/18/2020, 12:07 PM

## 2020-01-18 NOTE — Progress Notes (Addendum)
Physical Therapy Session Note  Patient Details  Name: Derek [name] MRN: [id number] Date of Birth: 1976-05-26  Today's Date: 01/18/2020 PT Individual Time: 1350-1445 PT Individual Time Calculation (min): 55 min   Short Term Goals: Week 1:  PT Short Term Goal 1 (Week 1): Pt will consistently complete bed<>w/c transfers with mod assist. PT Short Term Goal 2 (Week 1): Pt will propel w/c 75 ft with min assist. PT Short Term Goal 3 (Week 1): Pt will initiate pre-gait activities with max assist +1.  Skilled Therapeutic Interventions/Progress Updates:   Pt in w/c and agreeable to therapy, no c/o pain. Pt self-propelled w/c to day room w/ supervision via R hemi technique. Much improved ability to self-propel today, min verbal/visual cues for technique. Session focused on LLE NMR w/ emphasis on quad and hip musculature activation. Performed kinetron in sitting, 3 min @ level 90 cm/sec, verbal and tactile cues to visualize LLE muscles activating for motor imagery. Sit<>stands at rail w/ mod assist. Blocked practice of R forward/backward stepping, lateral weight shifting, and mini-squats. Tactile and verbal cues for L quad activation during all bouts of weight acceptance, needed total assist to block L knee in neutral extension. Pt w/ no palpable quad activation in standing, however suspect he is compensating to maintain upright w/ RLE as LLE buckled only intermittently. Therapist encouraged him to keep RUE support off rail. Performed 8-10 bouts of standing. Transitioned to supine and worked on B supine bridge w/ tactile and verbal cues for L glut activation, trace activation palpated. Performed NMES to L quad x5 min in supine w/ towel roll under knee. Tactile and verbal cues for L quad contraction during each cycle w/ palpable and strong contraction felt, not enough force to lift L foot off mat. Pt tolerated NMES well, skin remained intact, see parameters below Returned to room total assist via w/c, ended  session in w/c and all needs in reach.  Saebo Stim One 330 pulse width 35 Hz pulse rate On 8 sec/ off 8 sec Ramp up/ down 2 sec Symmetrical Biphasic wave form  Max intensity at 500 Ohm load  Therapy Documentation Precautions:  Precautions Precautions: Fall Precaution Comments: L neglect, hemiparesis Required Braces or Orthoses: Other Brace Other Brace: L knee immobilizer Restrictions Weight Bearing Restrictions: No  Therapy/Group: Individual Therapy  Jurnei Latini [name] 01/18/2020, 7:29 PM

## 2020-01-18 NOTE — Plan of Care (Signed)
  Problem: Consults Goal: RH STROKE PATIENT EDUCATION Description: See Patient Education module for education specifics  Outcome: Progressing   Problem: RH BOWEL ELIMINATION Goal: RH STG MANAGE BOWEL WITH ASSISTANCE Description: STG Manage Bowel with Assistance. Outcome: Progressing Goal: RH STG MANAGE BOWEL W/MEDICATION W/ASSISTANCE Description: STG Manage Bowel with Medication with Assistance. Outcome: Progressing   Problem: RH BLADDER ELIMINATION Goal: RH STG MANAGE BLADDER WITH ASSISTANCE Description: STG Manage Bladder With Assistance Outcome: Progressing Goal: RH STG MANAGE BLADDER WITH MEDICATION WITH ASSISTANCE Description: STG Manage Bladder With Medication With Assistance. Outcome: Progressing   Problem: RH SKIN INTEGRITY Goal: RH STG SKIN FREE OF INFECTION/BREAKDOWN Outcome: Progressing   Problem: RH SAFETY Goal: RH STG ADHERE TO SAFETY PRECAUTIONS W/ASSISTANCE/DEVICE Description: STG Adhere to Safety Precautions With Assistance/Device. Outcome: Progressing   Problem: RH PAIN MANAGEMENT Goal: RH STG PAIN MANAGED AT OR BELOW PT'S PAIN GOAL Outcome: Progressing   Problem: RH KNOWLEDGE DEFICIT Goal: RH STG INCREASE KNOWLEDGE OF DIABETES Outcome: Progressing Goal: RH STG INCREASE KNOWLEDGE OF HYPERTENSION Outcome: Progressing Goal: RH STG INCREASE KNOWLEDGE OF STROKE PROPHYLAXIS Outcome: Progressing

## 2020-01-18 NOTE — Care Management (Signed)
[facility] Individual Statement of Services  Patient Name:  Derek [name]  Date:  01/18/2020  Welcome to the [facility].  Our goal is to provide you with an individualized program based on your diagnosis and situation, designed to meet your specific needs.  With this comprehensive rehabilitation program, you will be expected to participate in at least 3 hours of rehabilitation therapies Monday-Friday, with modified therapy programming on the weekends.  Your rehabilitation program will include the following services:  Physical Therapy (PT), Occupational Therapy (OT), Speech Therapy (ST), 24 hour per day rehabilitation nursing, Therapeutic Recreaction (TR), Psychology, Neuropsychology, Case Management (Social Worker), Rehabilitation Medicine, Nutrition Services, Pharmacy Services and Other  Weekly team conferences will be held on Tuesdays to discuss your progress.  Your Social Worker will talk with you frequently to get your input and to update you on team discussions.  Team conferences with you and your family in attendance may also be held.  Expected length of stay: 19-21 days  Overall anticipated outcome: Minimal Assistance  Depending on your progress and recovery, your program may change. Your Social Worker will coordinate services and will keep you informed of any changes. Your Social Worker's name and contact numbers are listed  below.  The following services may also be recommended but are not provided by the [facility]:   Driving Evaluations  Home Health Rehabiltiation Services  Outpatient Rehabilitation Services  Vocational Rehabilitation   Arrangements will be made to provide these services after discharge if needed.  Arrangements include referral to agencies that provide these services.  Your insurance has been verified to be:  [employer]  Your primary doctor is:  Aliene Beams  Pertinent  information will be shared with your doctor and your insurance company.  Social Worker:  [name], LCSWA  Information discussed with and copy given to patient by: Gretchen Short, 01/18/2020, 2:12 PM

## 2020-01-18 NOTE — Progress Notes (Signed)
Occupational Therapy Session Note  Patient Details  Name: Derek [name] MRN: [id number] Date of Birth: [birth year]  Today's Date: 01/18/2020 OT Individual Time: 2233-6122 OT Individual Time Calculation (min): 75 min    Short Term Goals: Week 1:  OT Short Term Goal 1 (Week 1): Patient will don UB clothing with Min A and 1-3 cues for sequencing. OT Short Term Goal 2 (Week 1): Patient will complete supine <> EOB with Min A in prep for completion of ADLs OT Short Term Goal 3 (Week 1): Patient will complete sit to stand transfers with Mod A and DME as needed in prep for completion of ADLs.  Skilled Therapeutic Interventions/Progress Updates:  Patient met lying supine in bed in agreement with OT treatment session. 0/10 pain reported at rest and with activity. Noted swelling in RLE at ankle and toes. Patient reports that his L leg fell out of bed last night for an extended period of time and that he was unable to bring it from EOB back to bed surface. OT provided education on use of call bell for help when needed with patient expressing verbal understanding. Supine to EOB transfer with Mod A. Sit to stand with Stedy and Min A. Shower transfer with use of Sedy with multimodal cueing for L attention and hand over hand assist to maintain position of LUE on Stedy. Patient completed shower with Mod A and multimodal cueing for L attention with fewer cues needed to wash/dry L side. Patient able to sit EOB for dressing this date requiring CGA to maintain static sitting balance and Mod A to maintain dynamic sitting balance with LB dressing. Patient completed UB dressing with Mod A and use of hemi-dressing techniques, and LB dressing with Max A for maintaining standing balance. Patient continues to demonstrate heavy L lateral lean in standing requiring L knee block and Max multimodal cueing for weight shift to midline. Patient transported to sink for oral hygiene and grooming with total assist for time management.  Use of hemiparetic LUE on sink surface as stabilizer with Max A and hand over hand assist. Patient requested to shave with ability to direct therapist to gather needed items including razor, shaving cream, and washcloth. Patient able to sequence task with moderate cueing for shaving L side of face. Patient left seated in wc with call bell within reach, all needs met and belt alarm activated.  Therapy Documentation Precautions:  Precautions Precautions: Fall Precaution Comments: L neglect, hemiparesis Required Braces or Orthoses: Other Brace Other Brace: L knee immobilizer Restrictions Weight Bearing Restrictions: No   Therapy/Group: Individual Therapy  Keyasha Miah [name] 01/18/2020, 7:42 AM

## 2020-01-18 NOTE — IPOC Note (Signed)
Overall Plan of Care [facility]) Patient Details Name: Derek [name] MRN: [id number] DOB: October 25, 1976  Admitting Diagnosis: Right middle cerebral artery stroke [facility])  Hospital Problems: Principal Problem:   Right middle cerebral artery stroke [facility])     Functional Problem List: Nursing Bladder, Bowel, Behavior, Medication Management, Pain, Safety, Skin Integrity  PT Balance, Perception, Behavior, Safety, Sensory, Endurance, Motor  OT Balance, Cognition, Endurance, Motor, Pain  SLP Cognition, Nutrition  TR         Basic ADL's: OT Grooming, Bathing, Dressing, Toileting, Eating     Advanced  ADL's: OT       Transfers: PT Bed Mobility, Bed to Chair, Car, Occupational psychologist, Research scientist (life sciences): PT Stairs, Psychologist, prison and probation services, Ambulation     Additional Impairments: OT Fuctional Use of Upper Extremity  SLP Swallowing, Social Cognition   Problem Solving, Social Interaction, Memory, Attention, Awareness  TR      Anticipated Outcomes Item Anticipated Outcome  Self Feeding Mod I  Swallowing  Mod I   Basic self-care  Supervision A to Min A  Toileting  Min A   Bathroom Transfers Min A  Bowel/Bladder  Patient will manage B/B w/ min assistance  Transfers  min assist with LRAD  Locomotion  supervision w/c level, mod assist gait with PT  Communication     Cognition  Min A  Pain  Patient has chronic back pain, will manage pain with min assistance  Safety/Judgment  Patient will adhere to saftey plan on floor   Therapy Plan: PT Intensity: Minimum of 1-2 x/day ,45 to 90 minutes PT Frequency: 5 out of 7 days PT Duration Estimated Length of Stay: 3.5-4 weeks OT Intensity: Minimum of 1-2 x/day, 45 to 90 minutes OT Frequency: 5 out of 7 days OT Duration/Estimated Length of Stay: 19-21 days SLP Intensity: Minumum of 1-2 x/day, 30 to 90 minutes SLP Frequency: 3 to 5 out of 7 days SLP Duration/Estimated Length of Stay: 19-21 days   Due to the current state of  emergency, patients may not be receiving their 3-hours of Medicare-mandated therapy.   Team Interventions: Nursing Interventions Patient/Family Education, Bladder Management, Bowel Management, Disease Management/Prevention, Pain Management, Medication Management, Cognitive Remediation/Compensation, Discharge Planning, Psychosocial Support  PT interventions Ambulation/gait training, Community reintegration, DME/adaptive equipment instruction, Neuromuscular re-education, Psychosocial support, Stair training, UE/LE Strength taining/ROM, Wheelchair propulsion/positioning, UE/LE Coordination activities, Therapeutic Activities, Skin care/wound management, Pain management, Functional electrical stimulation, Discharge planning, Balance/vestibular training, Cognitive remediation/compensation, Disease management/prevention, Patient/family education, Functional mobility training, Splinting/orthotics, Therapeutic Exercise, Visual/perceptual remediation/compensation  OT Interventions Warden/ranger, Community reintegration, Cognitive remediation/compensation, Discharge planning, DME/adaptive equipment instruction, Functional electrical stimulation, Functional mobility training, Neuromuscular re-education, Pain management, Patient/family education, Psychosocial support, Self Care/advanced ADL retraining, Therapeutic Activities, Therapeutic Exercise, UE/LE Strength taining/ROM, UE/LE Coordination activities, Visual/perceptual remediation/compensation, Wheelchair propulsion/positioning  SLP Interventions Cognitive remediation/compensation, Dysphagia/aspiration precaution training, Internal/external aids, Therapeutic Activities, Environmental controls, Cueing hierarchy, Functional tasks, Multimodal communication approach, Patient/family education  TR Interventions    SW/CM Interventions Discharge Planning, Psychosocial Support, Patient/Family Education   Barriers to Discharge MD  Medical stability  Nursing  Medical stability, Home environment access/layout, Incontinence, Medication compliance, Behavior    PT Decreased caregiver support, Home environment access/layout, Inaccessible home environment, Lack of/limited family support unsure if family can provide physical assist at d/c, unsure if house is w/c acessible if necessary, steps to enter home & full flight to bed/bath on 2nd level  OT      SLP      SW  Team Discharge Planning: Destination: PT-Home ,OT- Home , SLP-Home Projected Follow-up: PT-Home health PT, 24 hour supervision/assistance, OT-  Home health OT, 24 hour supervision/assistance, SLP-Home Health SLP, Outpatient SLP, 24 hour supervision/assistance Projected Equipment Needs: PT-Wheelchair (measurements), Wheelchair cushion (measurements), OT-  , SLP-None recommended by SLP Equipment Details: PT-18x18 hemi height with L half lap tray, OT-TBD Patient/family involved in discharge planning: PT- Patient,  OT-Patient, SLP-Patient  MD ELOS: 21-23 days Medical Rehab Prognosis:  Excellent Assessment: The patient has been admitted for CIR therapies with the diagnosis of right MCA infarct with left hemiparesis and cognitive perceptual deficits. The team will be addressing functional mobility, strength, stamina, balance, safety, adaptive techniques and equipment, self-care, bowel and bladder mgt, patient and caregiver education, NMR, visual-spatial awareness, pain mgt, spasticity. Goals have been set at min assist for self-care and basic mobility and min assist to supervision for cognition and communication.   Due to the current state of emergency, patients may not be receiving their 3 hours per day of Medicare-mandated therapy.    Meredith Staggers, MD, FAAPMR      See Team Conference Notes for weekly updates to the plan of care

## 2020-01-19 ENCOUNTER — Inpatient Hospital Stay (HOSPITAL_COMMUNITY): Payer: BC Managed Care – PPO

## 2020-01-19 ENCOUNTER — Inpatient Hospital Stay (HOSPITAL_COMMUNITY): Payer: BC Managed Care – PPO | Admitting: Speech Pathology

## 2020-01-19 NOTE — Progress Notes (Signed)
Speech Language Pathology Daily Session Note  Patient Details  Name: AFNAN EMBERTON MRN: [id number] Date of Birth: Mar 10, 1976  Today's Date: 01/19/2020 SLP Individual Time: 1445-1545 SLP Individual Time Calculation (min): 60 min  Short Term Goals: Week 1: SLP Short Term Goal 1 (Week 1): Patient will consume regular textures with thin liquids with minimal overt s/s of aspiration and Mod I for use of swallowing compensatory strategies. SLP Short Term Goal 2 (Week 1): Patient will demonstrate sustained attention to functional tasks for 15 minutes with Min verbal cues for redirection. SLP Short Term Goal 3 (Week 1): Patient will recall new, daily information with Mod verbal cues. SLP Short Term Goal 4 (Week 1): Patient will identify 2 cognitive deficits with Mod verbal cues. SLP Short Term Goal 5 (Week 1): Patient will demonstrate functional problem solving for basic and familiar tasks with Min verbal cues.  Skilled Therapeutic Interventions:  Pt was seen for skilled ST targeting goals for cognition.  SLP facilitated the session with a novel card game to address goals for problem solving and sustained attention.  Pt was able to plan and execute a problem solving strategy within task with supervision verbal cues and he sustained attention to task for its duration with no cues needed for redirection.  Pt did need mild redirection to task when transferring back to bed at the end of session as he appeared to be slightly internally distracted.  Pt was left in bed with bed alarm set and call bell within reach.  Continue per current plan of care.    Pain Pain Assessment Pain Scale: 0-10 Pain Score: 0-No pain  Therapy/Group: Individual Therapy  Nailani Full, Melanee Spry 01/19/2020, 4:38 PM

## 2020-01-19 NOTE — Progress Notes (Signed)
[place] PHYSICAL MEDICINE & REHABILITATION PROGRESS NOTE   Subjective/Complaints:  MIldly elevated LDL, hx smoking, no HTN Pt asking about Stroke etiology   ROS: Patient denies CP, SOB, N/V/D Objective:   No results found. No results for input(s): WBC, HGB, HCT, PLT in the last 72 hours. No results for input(s): NA, K, CL, CO2, GLUCOSE, BUN, CREATININE, CALCIUM in the last 72 hours.  Intake/Output Summary (Last 24 hours) at 01/19/2020 1031 Last data filed at 01/19/2020 0455 Gross per 24 hour  Intake 360 ml  Output 650 ml  Net -290 ml     Physical Exam: Vital Signs Blood pressure 102/69, pulse 81, temperature 98.3 F (36.8 C), temperature source Oral, resp. rate 17, height 6\' 1"  (1.854 m), weight 90.2 kg, SpO2 96 %.    General: No acute distress Mood and affect are appropriate Heart: Regular rate and rhythm no rubs murmurs or extra sounds Lungs: Clear to auscultation, breathing unlabored, no rales or wheezes Abdomen: Positive bowel sounds, soft nontender to palpation, nondistended Extremities: No clubbing, cyanosis, or edema  Motor 0/5 RUE and RLE  Musculoskeletal: Full PROM, No pain PROM in the neck, trunk, or extremities. Posture appropriate Psych: flat, did smile, laugh some this am     Assessment/Plan: 1. Functional deficits secondary to right MCA infarct which require 3+ hours per day of interdisciplinary therapy in a comprehensive inpatient rehab setting.  Physiatrist is providing close team supervision and 24 hour management of active medical problems listed below.  Physiatrist and rehab team continue to assess barriers to discharge/monitor patient progress toward functional and medical goals  Care Tool:  Bathing    Body parts bathed by patient: Right arm, Chest, Abdomen, Front perineal area, Right upper leg, Left upper leg, Face, Right lower leg, Left lower leg   Body parts bathed by helper: Buttocks, Right arm     Bathing assist Assist Level:  Maximal Assistance - Patient 24 - 49%     Upper Body Dressing/Undressing Upper body dressing   What is the patient wearing?: Pull over shirt    Upper body assist Assist Level: Moderate Assistance - Patient 50 - 74%    Lower Body Dressing/Undressing Lower body dressing      What is the patient wearing?: Pants, Underwear/pull up     Lower body assist Assist for lower body dressing: Maximal Assistance - Patient 25 - 49%     Toileting Toileting    Toileting assist Assist for toileting: Moderate Assistance - Patient 50 - 74% Assistive Device Comment: urinal   Transfers Chair/bed transfer  Transfers assist     Chair/bed transfer assist level: Maximal Assistance - Patient 25 - 49%     Locomotion Ambulation   Ambulation assist   Ambulation activity did not occur: Safety/medical concerns          Walk 10 feet activity   Assist  Walk 10 feet activity did not occur: Safety/medical concerns        Walk 50 feet activity   Assist Walk 50 feet with 2 turns activity did not occur: Safety/medical concerns         Walk 150 feet activity   Assist Walk 150 feet activity did not occur: Safety/medical concerns         Walk 10 feet on uneven surface  activity   Assist Walk 10 feet on uneven surfaces activity did not occur: Safety/medical concerns         Wheelchair     Assist Will patient use  wheelchair at discharge?: Yes Type of Wheelchair: Manual    Wheelchair assist level: Supervision/Verbal cueing Max wheelchair distance: 100'    Wheelchair 50 feet with 2 turns activity    Assist        Assist Level: Supervision/Verbal cueing   Wheelchair 150 feet activity     Assist      Assist Level: Minimal Assistance - Patient > 75%   Blood pressure 102/69, pulse 81, temperature 98.3 F (36.8 C), temperature source Oral, resp. rate 17, height 6\' 1"  (1.854 m), weight 90.2 kg, SpO2 96 %.  Medical Problem List and Plan: 1.  Left  hemiplegia with facial droop/dysphagia secondary to right MCA infarction with hemorrhagic transformation, infarct embolic pattern.  Status post TEE 01/10/2020             -patient may shower             -ELOS/Goals: 2-3 weeks; supervision to min assist  -Patient continues CIR therapies including PT and OT SLP  -PRAFO/WHO at HS 2.  Antithrombotics: -DVT/anticoagulation: Lovenox initiated 01/10/2020  -recent dopplers negative             -antiplatelet therapy: Aspirin 325 mg daily 3. Pain Management: Lidoderm patch, oxycodone 5 mg every 6 hours as needed 4. Mood: Advised emotional support  -neuropsych consults  -pt denies depression at this time             -antipsychotic agents: N/A 5. Neuropsych: This patient is capable of making decisions on his own behalf. 6. Skin/Wound Care: Routine skin checks 7. Fluids/Electrolytes/Nutrition: encourage PO  -continue to monitor 8.  Dysphagia.  Dysphagia #2 nectar liquids.  Follow-up speech therapy 9. Normal BP off BP meds - monitor   10.  Hyperlipidemia.  Lipitor 11.  History of tobacco/alcohol/marijuana use.  NicoDerm patch.  Monitor for any signs of withdrawal provide counseling 12. Early extensor tone LLE:  -continue trial of baclofen 5mg  bid---tolerating thus far. No need to increase yet   -PRAFO  LOS: 4 days A FACE TO FACE EVALUATION WAS PERFORMED  Charlett Blake 01/19/2020, 10:31 AM

## 2020-01-19 NOTE — Progress Notes (Signed)
Occupational Therapy Session Note  Patient Details  Name: MARIO CORONADO MRN: [id number] Date of Birth: [birth year]  Today's Date: 01/19/2020 OT Individual Time: 1045-1200 OT Individual Time Calculation (min): 75 min    Short Term Goals: Week 1:  OT Short Term Goal 1 (Week 1): Patient will don UB clothing with Min A and 1-3 cues for sequencing. OT Short Term Goal 2 (Week 1): Patient will complete supine <> EOB with Min A in prep for completion of ADLs OT Short Term Goal 3 (Week 1): Patient will complete sit to stand transfers with Mod A and DME as needed in prep for completion of ADLs.  Skilled Therapeutic Interventions/Progress Updates:    1;1 pt received in bed agreeable to bathing and dressing pt with no pain reported. Edu re compensatory stratgeies for Suping>sitting EOB by hooking RLE under LLE. Pt requires MIN A to sit EOB for trunk steadying. Pt transfers in stedy with S for sit to stand to [facility] and then shower chair in shower. Total A toileting and Min A for bathing inshower with VC for strategy to wash RUE, A to hold LLE in figure 4 and MOD A for stanidng balance to wash buttocks. Pt completes dressing with MOD VC for hemi strategies and S. LB dressing with A to thread LLE in figure 4 and Pt able to thread RLE. OT advances pants past hips. A to place LUE In WB position for grooming/use as stabilizer.Estim applied to L deltoid for 60 min unattended. Exited session with pt seated in w/c, call light inreach and belt alarm on  [place]o Stim One 330 pulse width 35 Hz pulse rate On 8 sec/ off 8 sec Ramp up/ down 2 sec Symmetrical Biphasic wave form  Max intensity at 500 Ohm load Skin in tact, pt tolerated well  Therapy Documentation Precautions:  Precautions Precautions: Fall Precaution Comments: L neglect, hemiparesis Required Braces or Orthoses: Other Brace Other Brace: L knee immobilizer Restrictions Weight Bearing Restrictions: No General:   Vital Signs:   Pain:    ADL: ADL Grooming: Minimal assistance Where Assessed-Grooming: Sitting at sink Upper Body Bathing: Moderate assistance Where Assessed-Upper Body Bathing: Edge of bed Lower Body Bathing: Maximal assistance Where Assessed-Lower Body Bathing: Edge of bed Upper Body Dressing: Moderate assistance Where Assessed-Upper Body Dressing: Edge of bed Lower Body Dressing: Maximal assistance Where Assessed-Lower Body Dressing: Edge of bed Toileting: Maximal assistance Where Assessed-Toileting: Teacher, adult education: Maximal Dentist Method: Surveyor, minerals: Tourist information centre manager    Praxis   Exercises:   Other Treatments:     Therapy/Group: Individual Therapy  Shon Hale 01/19/2020, 11:36 AM

## 2020-01-20 ENCOUNTER — Inpatient Hospital Stay (HOSPITAL_COMMUNITY): Payer: BC Managed Care – PPO | Admitting: Physical Therapy

## 2020-01-20 MED ORDER — TIZANIDINE HCL 2 MG PO TABS
2.0000 mg | ORAL_TABLET | Freq: Every day | ORAL | Status: DC
  Administered 2020-01-20 – 2020-01-30 (×12): 2 mg via ORAL
  Filled 2020-01-20 (×12): qty 1

## 2020-01-20 NOTE — Progress Notes (Signed)
Physical Therapy Session Note  Patient Details  Name: Derek [name] MRN: [id number] Date of Birth: 08-25-1976  Today's Date: 01/20/2020 PT Individual Time: 1103-1200 PT Individual Time Calculation (min): 57 min   Short Term Goals: Week 1:  PT Short Term Goal 1 (Week 1): Pt will consistently complete bed<>w/c transfers with mod assist. PT Short Term Goal 2 (Week 1): Pt will propel w/c 75 ft with min assist. PT Short Term Goal 3 (Week 1): Pt will initiate pre-gait activities with max assist +1.  Skilled Therapeutic Interventions/Progress Updates: Pt presents in bed , agreeable to participate in therapy.  Pt states left LE feels tight.  PT performed passive stretches to left LE, all joints and planes w/ long hold at end-range.  Pt states "feeling sensation" in dorsum of foot w/ calf stretch.  Pt able to perform unilateral bridge to move hips to left side of bed,and required mod A for sup to sit using side rail.  Pt sat EOB w/o UE support x 5-6', nursing called to place lidoderm patch to LB.  Pt performed sit to stand transfer w/ mod A and blocking of left knee.  Pt able to take 2-3 small steps to pivot to w/c w/ left knee blocked.  Pt negotiated w/c in room and hallways to gym using right UE/LE, requiring 1 assist to avoid hitting left foot on doorframe w/ turn to gym.  Pt scooted to edge of seat w/ verbal cues for weight shift and mod A for left side.  Pt performed multiple sit to stand transfers in // bars w/ mod A and blocking left knee.  Education on forward lean, foot placement and right UE placement to increase independence.  Pt left hand placed on bar and facilitation given for weight-bearing to left, including weight shift.  Pt able to step right foot forward and back x 3-4 trials per stand.  Verbal cues for hand placement and initiation for stand to sit.  Pt negotiated w/c back to room w/ supervision and right UE/LE.  Pt states back feels much better out of bed.  Pt left sitting in w/c w/ alarm  on and all needs in reach.     Therapy Documentation Precautions:  Precautions Precautions: Fall Precaution Comments: L neglect, hemiparesis Required Braces or Orthoses: Other Brace Other Brace: L knee immobilizer Restrictions Weight Bearing Restrictions: No General:   Vital Signs:   Pain:  Pt states some discomfort w/ LB in bed.    Therapy/Group: Individual Therapy  Lucio Edward 01/20/2020, 12:36 PM

## 2020-01-20 NOTE — Progress Notes (Signed)
[facility] PHYSICAL MEDICINE & REHABILITATION PROGRESS NOTE   Subjective/Complaints:  Asking about CVA etiology again   ROS: Patient denies CP, SOB, N/V/D Objective:   No results found. No results for input(s): WBC, HGB, HCT, PLT in the last 72 hours. No results for input(s): NA, K, CL, CO2, GLUCOSE, BUN, CREATININE, CALCIUM in the last 72 hours.  Intake/Output Summary (Last 24 hours) at 01/20/2020 1027 Last data filed at 01/20/2020 0700 Gross per 24 hour  Intake 240 ml  Output 900 ml  Net -660 ml     Physical Exam: Vital Signs Blood pressure 102/76, pulse 75, temperature 98 F (36.7 C), temperature source Oral, resp. rate 19, height 6\' 1"  (1.854 m), weight 90.2 kg, SpO2 97 %.     General: No acute distress Mood and affect are appropriate Heart: Regular rate and rhythm no rubs murmurs or extra sounds Lungs: Clear to auscultation, breathing unlabored, no rales or wheezes Abdomen: Positive bowel sounds, soft nontender to palpation, nondistended Extremities: No clubbing, cyanosis, or edema Skin: No evidence of breakdown, no evidence of rash  Motor 0/5 RUE and RLE  Tone normal  Musculoskeletal: Full PROM, No pain PROM in the neck, trunk, or extremities. Posture appropriate Psych: flat, did smile, laugh some this am     Assessment/Plan: 1. Functional deficits secondary to right MCA infarct which require 3+ hours per day of interdisciplinary therapy in a comprehensive inpatient rehab setting.  Physiatrist is providing close team supervision and 24 hour management of active medical problems listed below.  Physiatrist and rehab team continue to assess barriers to discharge/monitor patient progress toward functional and medical goals  Care Tool:  Bathing    Body parts bathed by patient: Right arm, Chest, Abdomen, Front perineal area, Right upper leg, Left upper leg, Face, Right lower leg, Left lower leg   Body parts bathed by helper: Buttocks, Right arm     Bathing  assist Assist Level: Maximal Assistance - Patient 24 - 49%     Upper Body Dressing/Undressing Upper body dressing   What is the patient wearing?: Pull over shirt    Upper body assist Assist Level: Moderate Assistance - Patient 50 - 74%    Lower Body Dressing/Undressing Lower body dressing      What is the patient wearing?: Pants, Underwear/pull up     Lower body assist Assist for lower body dressing: Maximal Assistance - Patient 25 - 49%     Toileting Toileting    Toileting assist Assist for toileting: Moderate Assistance - Patient 50 - 74% Assistive Device Comment: urinal   Transfers Chair/bed transfer  Transfers assist     Chair/bed transfer assist level: Maximal Assistance - Patient 25 - 49%     Locomotion Ambulation   Ambulation assist   Ambulation activity did not occur: Safety/medical concerns          Walk 10 feet activity   Assist  Walk 10 feet activity did not occur: Safety/medical concerns        Walk 50 feet activity   Assist Walk 50 feet with 2 turns activity did not occur: Safety/medical concerns         Walk 150 feet activity   Assist Walk 150 feet activity did not occur: Safety/medical concerns         Walk 10 feet on uneven surface  activity   Assist Walk 10 feet on uneven surfaces activity did not occur: Safety/medical concerns         Wheelchair  Assist Will patient use wheelchair at discharge?: Yes Type of Wheelchair: Manual    Wheelchair assist level: Supervision/Verbal cueing Max wheelchair distance: 100'    Wheelchair 50 feet with 2 turns activity    Assist        Assist Level: Supervision/Verbal cueing   Wheelchair 150 feet activity     Assist      Assist Level: Minimal Assistance - Patient > 75%   Blood pressure 102/76, pulse 75, temperature 98 F (36.7 C), temperature source Oral, resp. rate 19, height 6\' 1"  (1.854 m), weight 90.2 kg, SpO2 97 %.  Medical Problem List  and Plan: 1.  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  Status post TEE 01/10/2020 Hypercoag w/u looks neg, risk factor of smoking , mild LDL elevation              -patient may shower             -ELOS/Goals: 2-3 weeks; supervision to min assist  -Patient continues CIR therapies including PT and OT SLP  -PRAFO/WHO at HS 2.  Antithrombotics: -DVT/anticoagulation: Lovenox initiated 01/10/2020  -recent dopplers negative             -antiplatelet therapy: Aspirin 325 mg daily 3. Pain Management: Lidoderm patch, oxycodone 5 mg every 6 hours as needed 4. Mood: Advised emotional support  -neuropsych consults  -pt denies depression at this time             -antipsychotic agents: N/A 5. Neuropsych: This patient is capable of making decisions on his own behalf. 6. Skin/Wound Care: Routine skin checks 7. Fluids/Electrolytes/Nutrition: encourage PO  -continue to monitor 8.  Dysphagia.  Dysphagia #2 nectar liquids.  Follow-up speech therapy 9. Normal BP off BP meds - monitor   10.  Hyperlipidemia.  Lipitor 11.  History of tobacco/alcohol/marijuana use.  NicoDerm patch.  Monitor for any signs of withdrawal provide counseling 12. Early extensor tone LLE:  -continue trial of baclofen 5mg  bid---tolerating thus far. No need to increase yet   -PRAFO  LOS: 5 days A FACE TO FACE EVALUATION WAS PERFORMED  Charlett Blake 01/20/2020, 10:27 AM

## 2020-01-21 ENCOUNTER — Inpatient Hospital Stay (HOSPITAL_COMMUNITY): Payer: BC Managed Care – PPO | Admitting: Physical Therapy

## 2020-01-21 ENCOUNTER — Inpatient Hospital Stay (HOSPITAL_COMMUNITY): Payer: BC Managed Care – PPO | Admitting: Occupational Therapy

## 2020-01-21 ENCOUNTER — Inpatient Hospital Stay (HOSPITAL_COMMUNITY): Payer: BC Managed Care – PPO | Admitting: Speech Pathology

## 2020-01-21 MED ORDER — BACLOFEN 10 MG PO TABS
10.0000 mg | ORAL_TABLET | Freq: Two times a day (BID) | ORAL | Status: DC
  Administered 2020-01-21 – 2020-01-22 (×2): 10 mg via ORAL
  Filled 2020-01-21 (×2): qty 1

## 2020-01-21 NOTE — Progress Notes (Signed)
Physical Therapy Session Note  Patient Details  Name: Derek [name] MRN: [id number] Date of Birth: September 04, 1976  Today's Date: 01/21/2020 PT Individual Time: 0915-1010 PT Individual Time Calculation (min): 55 min   Short Term Goals: Week 1:  PT Short Term Goal 1 (Week 1): Pt will consistently complete bed<>w/c transfers with mod assist. PT Short Term Goal 2 (Week 1): Pt will propel w/c 75 ft with min assist. PT Short Term Goal 3 (Week 1): Pt will initiate pre-gait activities with max assist +1.  Skilled Therapeutic Interventions/Progress Updates:   Pt received in supine, agreeable to therapy but reports increased L-sided muscle spasms. Pt also c/o increased "swelling" along L pec and shoulder area. Pt reports "I thought I was having a heart attack it hurt so much last night". Discussed w/ MD and PA, no immediate concern and pt safe to participate in therapy session. Supine>sit w/ min assist and max assist squat pivot to w/c. Pt self-propelled w/c to/from therapy gym w/ supervision via R hemi technique, verbal cues for obstacle avoidance and technique. Worked on sit<>stands at [employer]	[place]. Min-mod assist to standing and maintain static stance. Max assist for all dynamic tasks. Worked on lateral weight shifting and R forward and backwards stepping w/ emphasis on L quad activation during weight acceptance. Pt needed only max assist (vs total assist last session with this therapist) to maintain upright w/ L WB, even w/o RUE support on rail. No palpable quad contraction felt, however pt w/ improved ability to maintain L knee extension w/ no observable compensations made. Performed multiple bouts of standing. Stand pivot to mat w/ mod assist. Worked on L quad activation and NMR w/ NMES. Attempted various parameters for NMES unit. Palpable and visible contraction with all, however most successful with the parameters listed below. Performed for 10 min w/ active assist from therapist to achieve full LAQ, verbal  cues for pt to observe himself performing the movement for neuroplastic changes. Also had pt kick ball w/ LLE 50% of cycles. Also notable that pt was able to sense therapist donning/doffing e-stim pads. Returned to room via w/c, ended session in w/c and all needs in reach.   NMES parameters: -asymmetric waveform -3 sec ramp, 5 sec on and 10 sec off  -waveform 400 -intensity 36-38 Hz  Therapy Documentation Precautions:  Precautions Precautions: Fall Precaution Comments: L neglect, hemiparesis Required Braces or Orthoses: Other Brace Other Brace: L knee immobilizer Restrictions Weight Bearing Restrictions: No  Therapy/Group: Individual Therapy  Norwood Quezada [name] 01/21/2020, 10:16 AM

## 2020-01-21 NOTE — Progress Notes (Signed)
Physical Therapy Session Note  Patient Details  Name: Derek [name] MRN: [id number] Date of Birth: August 04, 1976  Today's Date: 01/21/2020 PT Individual Time: 1415-1445 PT Individual Time Calculation (min): 30 min   Short Term Goals: Week 1:  PT Short Term Goal 1 (Week 1): Pt will consistently complete bed<>w/c transfers with mod assist. PT Short Term Goal 2 (Week 1): Pt will propel w/c 75 ft with min assist. PT Short Term Goal 3 (Week 1): Pt will initiate pre-gait activities with max assist +1.  Skilled Therapeutic Interventions/Progress Updates:    Pt received seated in w/c in room, reports feeling fatigued but agreeable to PT session. No complaints of pain. Pt is at Supervision level for w/c mobility to/from gym with cues for obstacles on L side and doorway navigation. Sit to stand in // bars x 2 reps with mod A to stand with use of RUE on w/c armrest and L knee blocked. Upon initially standing pt in staggered stance with LLE forwards. With mod A for standing balance and RUE support on // bar pt able to advance RLE. Pt struggles to weight shift onto LLE. Focus on standing lateral weight shift onto LLE with multimodal cueing. Manual w/c propulsion back to room at Supervision level. Squat pivot transfer back to bed with max A. Sit to supine mod A for BLE management. Pt left semi-reclined in bed with needs in reach at end of session.  Therapy Documentation Precautions:  Precautions Precautions: Fall Precaution Comments: L neglect, hemiparesis Required Braces or Orthoses: Other Brace Other Brace: L knee immobilizer Restrictions Weight Bearing Restrictions: No    Therapy/Group: Individual Therapy   Peter Congo, PT, DPT  01/21/2020, 3:27 PM

## 2020-01-21 NOTE — Progress Notes (Signed)
Social Work Assessment and Plan   Patient Details  Name: Derek [name] MRN: [id number] Date of Birth: 01-Jun-1976  Today's Date: 01/21/2020  Problem List:  Patient Active Problem List   Diagnosis Date Noted  . Right middle cerebral artery stroke ([place]) 01/15/2020  . Leukocytosis 01/12/2020  . Cerebrovascular accident (CVA) due to thrombosis of right middle cerebral artery ([place])   . Chronic pain syndrome   . Hypokalemia   . Dysphagia, post-stroke   . Acute ischemic right MCA stroke ([place]) 01/07/2020  . Tobacco dependence   . Marijuana dependence ([place])   . Alcohol dependence ([place])    Past Medical History:  Past Medical History:  Diagnosis Date  . Acute ischemic right MCA stroke ([place]) 01/07/2020  . Alcohol dependence ([place])   . Back pain   . Marijuana dependence ([place])   . Tobacco dependence    Past Surgical History:  Past Surgical History:  Procedure Laterality Date  . BUBBLE STUDY  01/10/2020   Procedure: BUBBLE STUDY;  Surgeon: Geralynn Rile, MD;  Location: [facility];  Service: Cardiovascular;;  . TEE WITHOUT CARDIOVERSION N/A 01/10/2020   Procedure: TRANSESOPHAGEAL ECHOCARDIOGRAM (TEE);  Surgeon: Geralynn Rile, MD;  Location: [facility];  Service: Cardiovascular;  Laterality: N/A;   Social History:  reports that he has been smoking cigarettes. He has been smoking about 0.20 packs per day. He has never used smokeless tobacco. He reports current alcohol use. He reports current drug use. Drug: Marijuana.  Family / Support Systems Marital Status: Single Patient Roles: Partner, Parent Spouse/Significant Other: [name] [phone] Children: 65 y.o. son; 66 y.o. son Other Supports: Pt primary support will be his s/o Anticipated Caregiver: Pt primary caregiver will be his s/o and other family members Ability/Limitations of Caregiver: None reported. 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 Return to Work Plans: Would like to return to work Public relations account executive Issues: Denies Guardian/Conservator: N/A   Abuse/Neglect Abuse/Neglect Assessment Can Be Completed: Yes Physical Abuse: Denies Verbal Abuse: Denies Sexual Abuse: Denies Exploitation of patient/patient's resources: Denies Self-Neglect: Denies  Emotional Status Pt's affect, behavior and adjustment status: Pt appears to be adjusting to his condition Recent Psychosocial Issues: Denies Psychiatric History: Denies Substance Abuse History: Pt admits that he is a curernt smoker and smokes atleast 1pk per day; admits to a couple of beers during teh weekend and a "little more on the weekend." Pt admits to marijuana use on the weekend only.  Patient / Family Perceptions, Expectations & Goals Pt/Family understanding of illness & functional limitations: Pt and family have a general understanding of pt condition Premorbid pt/family roles/activities: Pt was independent with ADLs/IADLs Anticipated changes in roles/activities/participation: Pt will require assistance with ADLs/IADLs Pt/family expectations/goals: Pt goal is to work on his left side and walking  [employer]: None Premorbid Home Care/DME Agencies: None Transportation available at discharge: Pt s/o will transport to home Resource referrals recommended: Neuropsychology  Discharge Planning Living Arrangements: Spouse/significant other Support Systems: Spouse/significant other, Other relatives, Parent, Friends/neighbors Type of Residence: Private residence Insurance Resources: Multimedia programmer (specify)(BCBS) Financial Resources: Employment Financial Screen Referred: No Living  Expenses: Education officer, community Management: Patient, Significant Other Does the patient have any problems obtaining your medications?: No Social Work Anticipated Follow Up Needs:  HH/OP  Clinical Impression SW met with pt in room to introduce self, explain role, and discuss discharge process.   Lindon Kiel A Jannet Calip 01/21/2020, 6:03 PM

## 2020-01-21 NOTE — Progress Notes (Signed)
Occupational Therapy Session Note  Patient Details  Name: Derek [name] MRN: [id number] Date of Birth: 01-Dec-1975  Today's Date: 01/21/2020 OT Individual Time: 1030-1130 OT Individual Time Calculation (min): 60 min    Short Term Goals: Week 1:  OT Short Term Goal 1 (Week 1): Patient will don UB clothing with Min A and 1-3 cues for sequencing. OT Short Term Goal 2 (Week 1): Patient will complete supine <> EOB with Min A in prep for completion of ADLs OT Short Term Goal 3 (Week 1): Patient will complete sit to stand transfers with Mod A and DME as needed in prep for completion of ADLs.  Skilled Therapeutic Interventions/Progress Updates:  Patient met seated in wc with fiance present at bedside. Patient in agreement with skilled OT treatment session with focus on functional transfers, self-care re-education, and NMR as detailed below. Home set-up confirmed by fiance noting single-level home with 6 STE and bilateral rails although unable to reach both at the same time. Fiance confirmed presence of tub/shower combo with grab bars and hand-held shower head and standard commode with sink nearby. Bathroom is wc accessible. Patient indicated need for BM with ability to complete sit to stand from wc to [place] and CGA for maintaining position of L hand on [place]. Patient requires Mod multimodal cues for trunk alignment to midline demonstrating R lateral lean.Toileting/hygiene/clothing management with Max A. OT provided patient/family education on sitting on patients L side to encourage L attention when visiting. Hand hygiene and grooming seated at sink level in [place] with visual feedback for posture to midline with mirror. Patient completed oral hygiene at sink level with Min A and multimodal cues for use of LUE as a stabilizer and hand over hand assist to manipulate faucets, hold cup for rinsing, and holding toothbrush during application of toothpaste. Session concluded with patient seated in wc with fiance  present at bedside, call bell within reach, belt alarm activated, and all needs met.     Therapy Documentation Precautions:  Precautions Precautions: Fall Precaution Comments: L neglect, hemiparesis Required Braces or Orthoses: Other Brace Other Brace: L knee immobilizer Restrictions Weight Bearing Restrictions: No   Therapy/Group: Individual Therapy  Jaydin Boniface [name] 01/21/2020, 7:50 AM

## 2020-01-21 NOTE — Progress Notes (Addendum)
[facility] PHYSICAL MEDICINE & REHABILITATION PROGRESS NOTE   Subjective/Complaints:  Worked with SLP early this morning. No new complaints today other than increased spasms in left leg.   ROS: Patient denies fever, rash, sore throat, blurred vision, nausea, vomiting, diarrhea, cough, shortness of breath or chest pain, joint or back pain, headache, or mood change.  Objective:   No results found. No results for input(s): WBC, HGB, HCT, PLT in the last 72 hours. No results for input(s): NA, K, CL, CO2, GLUCOSE, BUN, CREATININE, CALCIUM in the last 72 hours.  Intake/Output Summary (Last 24 hours) at 01/21/2020 1010 Last data filed at 01/20/2020 1320 Gross per 24 hour  Intake 120 ml  Output --  Net 120 ml     Physical Exam: Vital Signs Blood pressure 123/82, pulse 81, temperature 98.2 F (36.8 C), temperature source Oral, resp. rate 18, height 6\' 1"  (1.854 m), weight 90.2 kg, SpO2 98 %.   Constitutional: No distress . Vital signs reviewed. HEENT: EOMI, oral membranes moist Neck: supple Cardiovascular: RRR without murmur. No JVD    Respiratory/Chest: CTA Bilaterally without wheezes or rales. Normal effort    GI/Abdomen: BS +, non-tender, non-distended Ext: no clubbing, cyanosis, or edema Psych: pleasant and cooperative, flat   Motor 0/5 RUE and RLE  Significant extensor tone LLE, ankle and knee Musculoskeletal: Full PROM, No pain PROM in the neck, trunk, or extremities. Posture fair       Assessment/Plan: 1. Functional deficits secondary to right MCA infarct which require 3+ hours per day of interdisciplinary therapy in a comprehensive inpatient rehab setting.  Physiatrist is providing close team supervision and 24 hour management of active medical problems listed below.  Physiatrist and rehab team continue to assess barriers to discharge/monitor patient progress toward functional and medical goals  Care Tool:  Bathing    Body parts bathed by patient: Right arm,  Chest, Abdomen, Front perineal area, Right upper leg, Left upper leg, Face, Right lower leg, Left lower leg   Body parts bathed by helper: Buttocks, Right arm     Bathing assist Assist Level: Maximal Assistance - Patient 24 - 49%     Upper Body Dressing/Undressing Upper body dressing   What is the patient wearing?: Pull over shirt    Upper body assist Assist Level: Moderate Assistance - Patient 50 - 74%    Lower Body Dressing/Undressing Lower body dressing      What is the patient wearing?: Pants, Underwear/pull up     Lower body assist Assist for lower body dressing: Maximal Assistance - Patient 25 - 49%     Toileting Toileting    Toileting assist Assist for toileting: Moderate Assistance - Patient 50 - 74% Assistive Device Comment: urinal   Transfers Chair/bed transfer  Transfers assist     Chair/bed transfer assist level: Moderate Assistance - Patient 50 - 74%     Locomotion Ambulation   Ambulation assist   Ambulation activity did not occur: Safety/medical concerns          Walk 10 feet activity   Assist  Walk 10 feet activity did not occur: Safety/medical concerns        Walk 50 feet activity   Assist Walk 50 feet with 2 turns activity did not occur: Safety/medical concerns         Walk 150 feet activity   Assist Walk 150 feet activity did not occur: Safety/medical concerns         Walk 10 feet on uneven surface  activity   Assist Walk 10 feet on uneven surfaces activity did not occur: Safety/medical concerns         Wheelchair     Assist Will patient use wheelchair at discharge?: Yes Type of Wheelchair: Manual    Wheelchair assist level: Supervision/Verbal cueing Max wheelchair distance: 100    Wheelchair 50 feet with 2 turns activity    Assist        Assist Level: Supervision/Verbal cueing   Wheelchair 150 feet activity     Assist      Assist Level: Minimal Assistance - Patient > 75%    Blood pressure 123/82, pulse 81, temperature 98.2 F (36.8 C), temperature source Oral, resp. rate 18, height 6\' 1"  (1.854 m), weight 90.2 kg, SpO2 98 %.  Medical Problem List and Plan: 1.  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  Status post TEE 01/10/2020 Hypercoag w/u looks neg, risk factor of smoking , mild LDL elevation              -patient may shower             -ELOS/Goals: 2-3 weeks; supervision to min assist  -Patient continues CIR therapies including PT and OT SLP  -PRAFO/WHO at HS--he's wearing 2.  Antithrombotics: -DVT/anticoagulation: Lovenox initiated 01/10/2020  -recent dopplers negative             -antiplatelet therapy: Aspirin 325 mg daily 3. Pain Management: Lidoderm patch, oxycodone 5 mg every 6 hours as needed 4. Mood: Advised emotional support  -neuropsych consults  -pt denies depression at this time             -antipsychotic agents: N/A 5. Neuropsych: This patient is capable of making decisions on his own behalf. 6. Skin/Wound Care: Routine skin checks 7. Fluids/Electrolytes/Nutrition: encourage PO  -continue to monitor  -recheck labs this week 8.  Dysphagia.  Dysphagia #2 nectar liquids.  Follow-up speech therapy 9. Normal BP off BP meds - monitor   10.  Hyperlipidemia.  Lipitor 11.  History of tobacco/alcohol/marijuana use.  NicoDerm patch.  Monitor for any signs of withdrawal provide counseling 12. Early extensor tone LLE:  -increase baclofen to 10mg  bid  -tizanidine also started by call MD  -aggressive ROM with therapy   -PRAFO  LOS: 6 days A FACE TO FACE EVALUATION WAS PERFORMED  Meredith Staggers 01/21/2020, 10:10 AM

## 2020-01-21 NOTE — Progress Notes (Signed)
Social Work Patient ID: Derek [name], male   DOB: 11-14-1975, 44 y.o.  MRN: [id number]   SW returned phone call to pt s/o [name] [phone]) who reported pt says SW asked for me to be contacted. SW explained this was suggested. SW discussed discharge plan and process. She reported concerns related to pt in which pt informed her that his sister came to the hospital to have him sign POA forms, and pt states that he was not aware of what he was signing and he has asked that she be the primary contact. SW explained not being knowledgeable on this incident, however, if pt desires to have only her contacted going forward he will need to communicate this to SW/staff. SW informed there will be updates tomorrow following team conference on goals and a discharge date.    [name], MSW, LCSWA Office: [phone] Cell: [phone] Fax: [phone]

## 2020-01-21 NOTE — Progress Notes (Signed)
Speech Language Pathology Daily Session Note  Patient Details  Name: Derek [name] MRN: [id number] Date of Birth: 09-19-1976  Today's Date: 01/21/2020 SLP Individual Time: 0815-0900 SLP Individual Time Calculation (min): 45 min  Short Term Goals: Week 1: SLP Short Term Goal 1 (Week 1): Patient will consume regular textures with thin liquids with minimal overt s/s of aspiration and Mod I for use of swallowing compensatory strategies. SLP Short Term Goal 2 (Week 1): Patient will demonstrate sustained attention to functional tasks for 15 minutes with Min verbal cues for redirection. SLP Short Term Goal 3 (Week 1): Patient will recall new, daily information with Mod verbal cues. SLP Short Term Goal 4 (Week 1): Patient will identify 2 cognitive deficits with Mod verbal cues. SLP Short Term Goal 5 (Week 1): Patient will demonstrate functional problem solving for basic and familiar tasks with Min verbal cues.  Skilled Therapeutic Interventions: Skilled treatment session focused on cognitive goals. SLP facilitated session by providing overall Mod A verbal cues for sustained attention and Min A verbal cues for problem solving and emergent awareness of errors during a mildly complex calendar organization task. Patient left upright in bed with all needs within reach and alarm on. Continue with current plan of care.      Pain Pain Assessment Pain Scale: 0-10 Pain Score: 0-No pain  Therapy/Group: Individual Therapy  Taher Vannote 01/21/2020, 2:36 PM

## 2020-01-22 ENCOUNTER — Inpatient Hospital Stay (HOSPITAL_COMMUNITY): Payer: BC Managed Care – PPO | Admitting: Occupational Therapy

## 2020-01-22 ENCOUNTER — Inpatient Hospital Stay (HOSPITAL_COMMUNITY): Payer: BC Managed Care – PPO | Admitting: Speech Pathology

## 2020-01-22 ENCOUNTER — Inpatient Hospital Stay (HOSPITAL_COMMUNITY): Payer: BC Managed Care – PPO | Admitting: Physical Therapy

## 2020-01-22 LAB — CREATININE, SERUM
Creatinine, Ser: 0.99 mg/dL (ref 0.61–1.24)
GFR calc Af Amer: >60 mL/min (ref >60)
GFR calc non Af Amer: >60 mL/min (ref >60)

## 2020-01-22 MED ORDER — BACLOFEN 10 MG PO TABS
10.0000 mg | ORAL_TABLET | Freq: Three times a day (TID) | ORAL | Status: DC
  Administered 2020-01-22 – 2020-02-10 (×57): 10 mg via ORAL
  Filled 2020-01-22 (×57): qty 1

## 2020-01-22 NOTE — Progress Notes (Signed)
Speech Language Pathology Daily Session Note  Patient Details  Name: Derek [name] MRN: [id number] Date of Birth: June 12, 1976  Today's Date: 01/22/2020 SLP Individual Time: 1100-1140 SLP Individual Time Calculation (min): 40 min  Short Term Goals: Week 1: SLP Short Term Goal 1 (Week 1): Patient will consume regular textures with thin liquids with minimal overt s/s of aspiration and Mod I for use of swallowing compensatory strategies. SLP Short Term Goal 2 (Week 1): Patient will demonstrate sustained attention to functional tasks for 15 minutes with Min verbal cues for redirection. SLP Short Term Goal 3 (Week 1): Patient will recall new, daily information with Mod verbal cues. SLP Short Term Goal 4 (Week 1): Patient will identify 2 cognitive deficits with Mod verbal cues. SLP Short Term Goal 5 (Week 1): Patient will demonstrate functional problem solving for basic and familiar tasks with Min verbal cues.  Skilled Therapeutic Interventions: Skilled treatment session focused on cognitive goals. SLP facilitated session by providing Min A verbal cues for organization during a complex calendar making task. Max A verbal cues were required for recall of his current medications with overall Min A verbal cues needed for problem solving while organizing a BID pill box. Patient's girlfriend present and educated in regards to patient's current cognitive function and goals of skilled SLP intervention, she verbalized understanding. Patient left upright in wheelchair with alarm on and all needs within reach. Continue with current plan of care.      Pain No/Denies Pain  Therapy/Group: Individual Therapy  Lilliemae Fruge 01/22/2020, 12:16 PM

## 2020-01-22 NOTE — Progress Notes (Addendum)
Physical Therapy Session Note  Patient Details  Name: SABIN GIBEAULT MRN: [id number] Date of Birth: July 05, 1976  Today's Date: 01/22/2020 PT Individual Time: 1435-1530 PT Individual Time Calculation (min): 55 min   Short Term Goals: Week 1:  PT Short Term Goal 1 (Week 1): Pt will consistently complete bed<>w/c transfers with mod assist. PT Short Term Goal 2 (Week 1): Pt will propel w/c 75 ft with min assist. PT Short Term Goal 3 (Week 1): Pt will initiate pre-gait activities with max assist +1.  Skilled Therapeutic Interventions/Progress Updates:   Pt received in w/c, agreeable to therapy and denies pain. Pt self-propelled w/c to/from therapy gym w/ supervision via R hemi technique. Session focused on LLE muscle activation. Performed kinetron 2 min x2 @ level 90 cm/sec to work on BLE reciprocal movement pattern and LLE muscle activation. Pt reports he feels some muscle activation in his L hip and pt able to push against very light resistance on pedal. Sit<>stands at rail w/ min assist, min assist for static standing balance, mod assist for balance while performing R forward and backward stepping and lateral weight shifting. Emphasis on achieving L quad activation w/ weight acceptance. Continues to need max-total assist at L knee to prevent buckling, trace quad activation palpated 2-3 instances. Performed multiple bouts of standing. Transitioned to supine on mat w/ mod assist to transfer and for sit>supine. Performed BLE supine bridge w/ tactile and verbal cues for L glut activation, performed 5 reps, no palpable contraction noted. Supine to R sidelying w/ min assist. Worked on L quad activation w/ LLE on powder board in gravity eliminated position. Performed NMES using Saebo to L quad x10 min w/ tactile and verbal cues to extend LLE w/ each stimulation. Pt tolerated NMES well, skin remained intact, see parameters below. Returned to room via w/c, ended session in w/c and all needs in reach.   Saebo  Stim One 330 pulse width 35 Hz pulse rate On 8 sec/ off 8 sec Ramp up/ down 2 sec Symmetrical Biphasic wave form  Max intensity at 500 Ohm load  Therapy Documentation Precautions:  Precautions Precautions: Fall Precaution Comments: L neglect, hemiparesis Required Braces or Orthoses: Other Brace Other Brace: L knee immobilizer Restrictions Weight Bearing Restrictions: No  Therapy/Group: Individual Therapy  Emmajean Ratledge [name] 01/22/2020, 3:37 PM

## 2020-01-22 NOTE — Plan of Care (Signed)
  Problem: Consults Goal: RH STROKE PATIENT EDUCATION Description: See Patient Education module for education specifics  Outcome: Progressing   Problem: RH BOWEL ELIMINATION Goal: RH STG MANAGE BOWEL WITH ASSISTANCE Description: STG Manage Bowel with Assistance. Outcome: Progressing Goal: RH STG MANAGE BOWEL W/MEDICATION W/ASSISTANCE Description: STG Manage Bowel with Medication with Assistance. Outcome: Progressing   Problem: RH BLADDER ELIMINATION Goal: RH STG MANAGE BLADDER WITH ASSISTANCE Description: STG Manage Bladder With Assistance Outcome: Progressing Goal: RH STG MANAGE BLADDER WITH MEDICATION WITH ASSISTANCE Description: STG Manage Bladder With Medication With Assistance. Outcome: Progressing   Problem: RH SKIN INTEGRITY Goal: RH STG SKIN FREE OF INFECTION/BREAKDOWN Outcome: Progressing   Problem: RH SAFETY Goal: RH STG ADHERE TO SAFETY PRECAUTIONS W/ASSISTANCE/DEVICE Description: STG Adhere to Safety Precautions With Assistance/Device. Outcome: Progressing   Problem: RH PAIN MANAGEMENT Goal: RH STG PAIN MANAGED AT OR BELOW PT'S PAIN GOAL Outcome: Progressing   Problem: RH KNOWLEDGE DEFICIT Goal: RH STG INCREASE KNOWLEDGE OF DIABETES Outcome: Progressing Goal: RH STG INCREASE KNOWLEDGE OF HYPERTENSION Outcome: Progressing Goal: RH STG INCREASE KNOWLEDGE OF STROKE PROPHYLAXIS Outcome: Progressing   

## 2020-01-22 NOTE — Patient Care Conference (Signed)
Inpatient RehabilitationTeam Conference and Plan of Care Update Date: 01/22/2020   Time: 10:10 AM   Patient Name: Derek [name]      Medical Record Number: [id number]  Date of Birth: 02/01/1976 Sex: Male         Room/Bed: 4W15C/4W15C-01 Payor Info: Payor: BLUE CROSS BLUE SHIELD / Plan: BCBS COMM PPO / Product Type: *No Product type* /    Admit Date/Time:  01/15/2020  6:30 PM  Primary Diagnosis:  Right middle cerebral artery stroke [facility])  Patient Active Problem List   Diagnosis Date Noted  . 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 Leukocytosis 01/12/2020  . Cerebrovascular accident (CVA) due to thrombosis of right middle cerebral artery (HCC)   . Chronic pain syndrome   . Hypokalemia   . Dysphagia, post-stroke   . 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 Tobacco dependence   . Marijuana dependence (HCC)   . Alcohol dependence [facility])     Expected Discharge Date: Expected Discharge Date: 02/13/20  Team Members Present: Physician leading conference: Dr. Faith Rogue Care Coodinator Present: [name], LCSWA;Genie Tyreka Henneke, RN, MSN Nurse Present: Doran Durand, LPN PT Present: Carlynn Purl, PT OT Present: Lina Sayre, OT SLP Present: Feliberto Gottron, SLP PPS Coordinator present : Edson Snowball, Park Breed, SLP     Current Status/Progress Goal Weekly Team Focus  Bowel/Bladder   continent of B&B LMB-3/23  remain continent  asses qshift and prn   Swallow/Nutrition/ Hydration   Regular textures with thin liquids, Intermittent Supervision-Mod I  Mod I  tolerance of diet upgrade   ADL's   dense hemiplegia in UE/LE tolerating Estim, MOD A bathing/LB dressing, total A toileting, MOD A sit to stand and up to MAX A for transfers, S UB dressing  MIN A-S overall  L NMR, hemi dressing, hip flexibility-LB dressing strategies, L attention   Mobility   dense L hemi, only muscle activation w/ NMES, mod-max assist transfers, gait unable,  supervision-min assist w/c mobility  min assist transfers, mod assist gait  LLE NMR, initiating gait, standing/WB, postural control and balance   Communication             Safety/Cognition/ Behavioral Observations  Mod-Max A  Min A  complex problem solving, recall with use of strategies, selective attention, emergent awareness   Pain   no c/o of pain  remain free of pain  assess pain qshift and prn   Skin   no skin issues  remain free of any new breakdown  assess skin qshift and prn    Rehab Goals Patient on target to meet rehab goals: Yes *See Care Plan and progress notes for long and short-term goals.     Barriers to Discharge  Current Status/Progress Possible Resolutions Date Resolved   Nursing                  PT                    OT                  SLP                SW                Discharge Planning/Teaching Needs:  D/c to home with s/o [name] who will provide assistance to pt. Pt also states his 70 y.o,. son can help him as well.  Family education as recommended by  therapy.   Team Discussion: MD R MCA infarct 3/8, monitoring BP, L hemi, spasticity LLE, med adjustments, may need OP botox.  RN cont B/B, low back pain, lidocaine patch on.  OT estim, min/S to steady, tot A toileting, mod/max transfers, has finace, educating her.  PT mod/max transfers, goals S/min A, no gait at home recommended, may need ramp at home.  Has 6 steps at home.  SLP regular/thin liquids diet, decreased attention, focusing on basic cognition.   Revisions to Treatment Plan: N/A     Medical Summary Current Status: right MCA infarct with left hemiparesis. significant spasticity. bp better controlled Weekly Focus/Goal: improved spasticity mgt, orthotics, pt education  Barriers to Discharge: Behavior;Other (comments)  Barriers to Discharge Comments: spasticity Possible Resolutions to Barriers: see medical progress list   Continued Need for Acute Rehabilitation Level of Care: The patient  requires daily medical management by a physician with specialized training in physical medicine and rehabilitation for the following reasons: Direction of a multidisciplinary physical rehabilitation program to maximize functional independence : Yes Medical management of patient stability for increased activity during participation in an intensive rehabilitation regime.: Yes Analysis of laboratory values and/or radiology reports with any subsequent need for medication adjustment and/or medical intervention. : Yes   I attest that I was present, lead the team conference, and concur with the assessment and plan of the team.   Retta Diones 01/22/2020, 2:22 PM   Team conference was held via web/ teleconference due to [place] - 19

## 2020-01-22 NOTE — Progress Notes (Signed)
[place] PHYSICAL MEDICINE & REHABILITATION PROGRESS NOTE   Subjective/Complaints:  Upset that he's not gotten his breakfast yet. NT went to go heat it up. Still having spasms LLE  ROS: Patient denies fever, rash, sore throat, blurred vision, nausea, vomiting, diarrhea, cough, shortness of breath or chest pain,  headache, or mood change.     Objective:   No results found. No results for input(s): WBC, HGB, HCT, PLT in the last 72 hours. Recent Labs    01/22/20 0513  CREATININE 0.99    Intake/Output Summary (Last 24 hours) at 01/22/2020 1213 Last data filed at 01/21/2020 2056 Gross per 24 hour  Intake 220 ml  Output 1 ml  Net 219 ml     Physical Exam: Vital Signs Blood pressure 110/83, pulse 77, temperature 98.2 F (36.8 C), temperature source Oral, resp. rate 20, height 6\' 1"  (1.854 m), weight 90.2 kg, SpO2 96 %.   Constitutional: No distress . Vital signs reviewed. HEENT: EOMI, oral membranes moist Neck: supple Cardiovascular: RRR without murmur. No JVD    Respiratory/Chest: CTA Bilaterally without wheezes or rales. Normal effort    GI/Abdomen: BS +, non-tender, non-distended Ext: no clubbing, cyanosis, or edema Psych: pleasant and cooperative for the most part Motor 0/5 RUE and RLE  Significant extensor tone LLE, ankle and knee--2/4 Musculoskeletal: Full PROM, No pain PROM in the neck, trunk, or extremities. Posture fair       Assessment/Plan: 1. Functional deficits secondary to right MCA infarct which require 3+ hours per day of interdisciplinary therapy in a comprehensive inpatient rehab setting.  Physiatrist is providing close team supervision and 24 hour management of active medical problems listed below.  Physiatrist and rehab team continue to assess barriers to discharge/monitor patient progress toward functional and medical goals  Care Tool:  Bathing    Body parts bathed by patient: Right arm, Right lower leg, Left arm, Left lower leg, Chest,  Face, Abdomen, Front perineal area, Buttocks, Right upper leg, Left upper leg   Body parts bathed by helper: Buttocks, Right arm     Bathing assist Assist Level: Moderate Assistance - Patient 50 - 74%     Upper Body Dressing/Undressing Upper body dressing   What is the patient wearing?: Pull over shirt    Upper body assist Assist Level: Moderate Assistance - Patient 50 - 74%    Lower Body Dressing/Undressing Lower body dressing      What is the patient wearing?: Pants, Underwear/pull up     Lower body assist Assist for lower body dressing: Moderate Assistance - Patient 50 - 74%     Toileting Toileting    Toileting assist Assist for toileting: Maximal Assistance - Patient 25 - 49% Assistive Device Comment: BSC over toilet in bathroom   Transfers Chair/bed transfer  Transfers assist     Chair/bed transfer assist level: Moderate Assistance - Patient 50 - 74%     Locomotion Ambulation   Ambulation assist   Ambulation activity did not occur: Safety/medical concerns          Walk 10 feet activity   Assist  Walk 10 feet activity did not occur: Safety/medical concerns        Walk 50 feet activity   Assist Walk 50 feet with 2 turns activity did not occur: Safety/medical concerns         Walk 150 feet activity   Assist Walk 150 feet activity did not occur: Safety/medical concerns         Walk 10  feet on uneven surface  activity   Assist Walk 10 feet on uneven surfaces activity did not occur: Safety/medical concerns         Wheelchair     Assist Will patient use wheelchair at discharge?: Yes Type of Wheelchair: Manual    Wheelchair assist level: Supervision/Verbal cueing Max wheelchair distance: 150'    Wheelchair 50 feet with 2 turns activity    Assist        Assist Level: Supervision/Verbal cueing   Wheelchair 150 feet activity     Assist      Assist Level: Supervision/Verbal cueing   Blood pressure  110/83, pulse 77, temperature 98.2 F (36.8 C), temperature source Oral, resp. rate 20, height 6\' 1"  (1.854 m), weight 90.2 kg, SpO2 96 %.  Medical Problem List and Plan: 1.  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  Status post TEE 01/10/2020 Hypercoag w/u looks neg, risk factor of smoking , mild LDL elevation              -patient may shower             -ELOS/Goals: 2-3 weeks; supervision to min assist  -team conference today  -PRAFO/WHO at HS--he's wearing 2.  Antithrombotics: -DVT/anticoagulation: Lovenox initiated 01/10/2020  -recent dopplers negative             -antiplatelet therapy: Aspirin 325 mg daily 3. Pain Management: Lidoderm patch, oxycodone 5 mg every 6 hours as needed 4. Mood: Advised emotional support  -neuropsych consults  -pt denies depression at this time             -antipsychotic agents: N/A 5. Neuropsych: This patient is capable of making decisions on his own behalf. 6. Skin/Wound Care: Routine skin checks 7. Fluids/Electrolytes/Nutrition: encourage PO  -continue to monitor  3/23 -recheck labs THursday 8.  Dysphagia.  on regular/thins diet and tolerating 9.  HTN:  off BP meds - monitor   BP controlled 10.  Hyperlipidemia.  Lipitor 11.  History of tobacco/alcohol/marijuana use.  NicoDerm patch.  Monitor for any signs of withdrawal provide counseling 12. Extensor tone LLE > LUE  3/23 -increase baclofen to 10mg  TID---observe for tolerance  -tizanidine also started by call MD over weekend  -aggressive ROM with therapy   -PRAFO  LOS: 7 days A FACE TO FACE EVALUATION WAS PERFORMED  4/23 01/22/2020, 12:13 PM

## 2020-01-22 NOTE — Progress Notes (Addendum)
Occupational Therapy Session Note  Patient Details  Name: Derek [name] MRN: [id number] Date of Birth: 11-17-1975  Today's Date: 01/22/2020 OT Individual Time: 0900-1000 OT Individual Time Calculation (min): 60 min   OT Individual Time: 1300-1345 OT Individual Time Calculation (min): 45 min   Short Term Goals: Week 1:  OT Short Term Goal 1 (Week 1): Patient will don UB clothing with Min A and 1-3 cues for sequencing. OT Short Term Goal 2 (Week 1): Patient will complete supine <> EOB with Min A in prep for completion of ADLs OT Short Term Goal 3 (Week 1): Patient will complete sit to stand transfers with Mod A and DME as needed in prep for completion of ADLs.  Skilled Therapeutic Interventions/Progress Updates:  Session 1: Patient met lying supine in bed at conclusion of breakfast and in agreement with OT treatment session. 0/10 pain present at rest. Supine to EOB with HOB flat and cueing for hemi- technique. Patients fiance present for portion of treatment session. Request to shower requiring supervision A for sit to stand using Stedy. Patient demonstrates increased attention to L, positioning LUE without cueing on Stedy in prep for sit to stand. Patient completed UB bath seated on BSC and LB bath in sitting/standing requiring Max A to maintain standing for washing buttocks with L knee block. Patient also notes discomfort in L hip with figure-4 position requiring assist to maintain. UB dressing seated EOB and LB dressing in sitting standing with Max A and multimodal cueing for orientation to midline, weight shifting to R demonstrating heavy posterolateral lean to L. Patient left seated in wc with call bell within reach, belt alarm activated, and fiance present at bedside.    Session 2: Patient met seated in wc with fiance present at bedside. Self-care red-education, NMR, and therapeutic activity as detailed below. Squat pivot transfer from wc to mat table to L with Mod A and back to wc on R with  Mod A. Education and application of shoe button on patients shoes to increase independence with donning/doffing footwear. Visual aid with use of tape on laces. Multiple sit to stands from mat table with Mod A and visual feedback from mirror. Patient continues to require Max A for L knee block. Patient required multimodal cues for weight shift to R and foot placement prior to standing. Patient notes increased pain in back with number unspecified and pain felt in L hip with crossing RLE over LLE to don footwear. Cat/cow stretching for back, chest and neck stretching with use of yoga ball, and BLE stretching at hamstrings and hips to increase flexibility and independence with LB dressing. Patient transported back to room. Session concluded with patient seated in wc with call bell within reach, belt alarm activated, and fiance present at bedside.   Therapy Documentation Precautions:  Precautions Precautions: Fall Precaution Comments: L neglect, hemiparesis Required Braces or Orthoses: Other Brace Other Brace: L knee immobilizer Restrictions Weight Bearing Restrictions: No   Therapy/Group: Individual Therapy  Sana Tessmer [name] 01/22/2020, 7:45 AM

## 2020-01-23 ENCOUNTER — Inpatient Hospital Stay (HOSPITAL_COMMUNITY): Payer: BC Managed Care – PPO | Admitting: Physical Therapy

## 2020-01-23 ENCOUNTER — Inpatient Hospital Stay (HOSPITAL_COMMUNITY): Payer: BC Managed Care – PPO | Admitting: Speech Pathology

## 2020-01-23 ENCOUNTER — Inpatient Hospital Stay (HOSPITAL_COMMUNITY): Payer: BC Managed Care – PPO | Admitting: Occupational Therapy

## 2020-01-23 NOTE — Progress Notes (Signed)
Speech Language Pathology Weekly Progress and Session Note  Patient Details  Name: Derek [name] MRN: [id number] Date of Birth: 10-24-1976  Beginning of progress report period: January 16, 2020 End of progress report period: January 23, 2020  Today's Date: 01/23/2020 SLP Individual Time: 1405-1500 SLP Individual Time Calculation (min): 55 min  Short Term Goals: Week 1: SLP Short Term Goal 1 (Week 1): Patient will consume regular textures with thin liquids with minimal overt s/s of aspiration and Mod I for use of swallowing compensatory strategies. SLP Short Term Goal 1 - Progress (Week 1): Met SLP Short Term Goal 2 (Week 1): Patient will demonstrate sustained attention to functional tasks for 15 minutes with Min verbal cues for redirection. SLP Short Term Goal 2 - Progress (Week 1): Met SLP Short Term Goal 3 (Week 1): Patient will recall new, daily information with Mod verbal cues. SLP Short Term Goal 3 - Progress (Week 1): Met SLP Short Term Goal 4 (Week 1): Patient will identify 2 cognitive deficits with Mod verbal cues. SLP Short Term Goal 4 - Progress (Week 1): Met SLP Short Term Goal 5 (Week 1): Patient will demonstrate functional problem solving for basic and familiar tasks with Min verbal cues. SLP Short Term Goal 5 - Progress (Week 1): Met    New Short Term Goals: Week 2: SLP Short Term Goal 1 (Week 2): Patient will demonstrate sustained attention to functional tasks for 45 minutes with Min verbal cues for redirection. SLP Short Term Goal 2 (Week 2): Patient will recall new, daily information with Min verbal cues. SLP Short Term Goal 3 (Week 2): Patient will self-monitor and correct errors during functional tasks with Min verbal cues. SLP Short Term Goal 4 (Week 2): Patient will demonstrate functional problem solving for mildly complex tasks with Min verbal cues.  Weekly Progress Updates: Patient has made excellent gains and has met 5 of 5 STGs this reporting period. Currently,  patient is consuming regular textures with thin liquids with minimal overt s/s of aspiration and overall Mod I for use of swallowing compensatory strategies. Patient demonstrates improved cognitive functioning and requires overall Min-Mod A verbal cues for basic problem solving, sustained attention, recall of functional information and intellectual awareness. Patient and family education ongoing. Patient would benefit from continued skilled SLP intervention to maximize his cognitive functioning and overall functional independence prior to discharge.      Intensity: Minumum of 1-2 x/day, 30 to 90 minutes Frequency: 3 to 5 out of 7 days Duration/Length of Stay: 4/14 Treatment/Interventions: Cognitive remediation/compensation;Dysphagia/aspiration precaution training;Internal/external aids;Therapeutic Activities;Environmental controls;Cueing hierarchy;Functional tasks;Multimodal communication approach;Patient/family education   Daily Session  Skilled Therapeutic Interventions: Skilled treatment session focused on cognitive goals. SLP facilitated session by providing overall Max A multimodal cues and extra time for complex problem solving and organization during a daily scheduling task.  Patient demonstrated selective attention to task for ~45 minutes with supervision level verbal cues. Patient demonstrated increased difficulty with task due to decreased working memory of information. Patient left upright in the recliner with alarm on and all needs within reach. Continue with current plan of care.      Pain No/Denies Pain   Therapy/Group: Individual Therapy  Eliza Green 01/23/2020, 6:42 AM

## 2020-01-23 NOTE — Progress Notes (Signed)
Physical Therapy Weekly Progress Note  Patient Details  Name: Derek [name] MRN: [id number] Date of Birth: Jun 09, 1976  Beginning of progress report period: January 16, 2020 End of progress report period: January 23, 2020  Today's Date: 01/23/2020 PT Individual Time: 1015-1110 PT Individual Time Calculation (min): 55 min   Patient has met 3 of 3 short term goals. Pt has made steady progress towards LTGs over last week. Postural control in standing is improved and he compensates well w/ unaffected RUE/RLE. He performs bed mobility and transfers w/ mod assist, w/c mobility w/ supervision via R hemi technique, and pregait activities w/ max assist. Dense L hemiplegia remains, although strong L quad and ant tib activation is elicited w/ NMES.   Patient continues to demonstrate the following deficits muscle weakness and muscle paralysis, decreased cardiorespiratoy endurance, abnormal tone, unbalanced muscle activation, decreased coordination and decreased motor planning, decreased attention to right, decreased problem solving, decreased safety awareness and delayed processing and decreased sitting balance, decreased standing balance, decreased postural control, hemiplegia and decreased balance strategies and therefore will continue to benefit from skilled PT intervention to increase functional independence with mobility.  Patient progressing toward long term goals..  Continue plan of care.  PT Short Term Goals Week 1:  PT Short Term Goal 1 (Week 1): Pt will consistently complete bed<>w/c transfers with mod assist. PT Short Term Goal 1 - Progress (Week 1): Met PT Short Term Goal 2 (Week 1): Pt will propel w/c 75 ft with min assist. PT Short Term Goal 2 - Progress (Week 1): Met PT Short Term Goal 3 (Week 1): Pt will initiate pre-gait activities with max assist +1. PT Short Term Goal 3 - Progress (Week 1): Met Week 2:  PT Short Term Goal 1 (Week 2): Pt will initiate gait training PT Short Term Goal 2  (Week 2): Pt will maintain static standing balance w/ min assist consistently PT Short Term Goal 3 (Week 2): Pt will maintain dynamic sitting balance w/ supervision  Skilled Therapeutic Interventions/Progress Updates:   Pt received in w/c and agreeable to therapy, no c/o pain. Pt self-propelled w/c to/from therapy gym w/ supervision via R hemi technique. Verbal cues for obstacle avoidance. Mod assist stand pivot to/from NuStep and performed x10 min @ level 1. Had pt alternate between BLE/RUE use and BLE only use. 1-2 min for each and alternating. Emphasis on LLE activation w/ tactile and verbal cues at L quad and hamstring musculature throughout. Needs min assist to perform task w/o RUE self-assist. Emphasis on achieving reciprocal movement pattern and limb dissociation as well. Performed sit<>stands at rail w/ pre-gait tasks including lateral weight shifting, R forward and backward stepping, and partial knee bends. Tactile and verbal cues for L quad activation with weight acceptance, no contraction felt with multiple bouts of standing. Needed mod-max assist for dynamic standing balance, min-mod manual facilitation of lateral weight shifting. Added on L DF ACE wrap for DF control and ambulated at rail, working on LLE muscle activation, facilitating gait, and WB of LLE. Ambulated 5' and 10' x2 w/ mod-max assist for upright balance and total assist for LLE management. No palpable muscle contraction. Returned to room via w/c, ended session in w/c and all needs in reach.   Therapy Documentation Precautions:  Precautions Precautions: Fall Precaution Comments: L neglect, hemiparesis Required Braces or Orthoses: Other Brace Other Brace: L knee immobilizer Restrictions Weight Bearing Restrictions: No Vital Signs: Therapy Vitals Temp: 98.7 F (37.1 C) Pulse Rate: 90 Resp: 18 BP: 111/73  Patient Position (if appropriate): Sitting Oxygen Therapy SpO2: 98 % O2 Device: Room Air  Therapy/Group:  Individual Therapy  Halim Surrette Clent Demark 01/23/2020, 6:26 PM

## 2020-01-23 NOTE — Progress Notes (Signed)
Social Work Patient ID: Derek [name], male   DOB: [birth year], 44 y.o.   MRN: [id number]   SW met with pt in room to provide updates from team conference, and anticipated d/c date 4/14. Pt mentioned being concerned about not being able to return to work. SW asked pt if he is eligible for short term/long term disability through his employer, and he stated his sister was working on this. SW informed pt he could always consider unemployment and/or SSDI. SW informed pt this SW is able to provide documents he will need to file SSDI but does not process the application. Pt aware SW will follow-up with his s/o.   SW received phone call from pt s/o Derek [name]. SW discussed above. SW explained how to apply for SSDI and scheduling a phone conference. She reports she is working on finding a company that is able to build a ramp for the home since she has 7 steps to enter. SW informed will continue to follow-up with updates as provided. She reports she should be here tomorrow to visit pt.   Derek [name], MSW, [place] Office: [phone] Cell: [phone] Fax: [phone]

## 2020-01-23 NOTE — Plan of Care (Signed)
  Problem: RH SKIN INTEGRITY Goal: RH STG SKIN FREE OF INFECTION/BREAKDOWN Outcome: Progressing   Problem: RH SAFETY Goal: RH STG ADHERE TO SAFETY PRECAUTIONS W/ASSISTANCE/DEVICE Description: STG Adhere to Safety Precautions With Assistance/Device. Outcome: Progressing   Problem: RH KNOWLEDGE DEFICIT Goal: RH STG INCREASE KNOWLEDGE OF HYPERTENSION Outcome: Progressing

## 2020-01-23 NOTE — Progress Notes (Signed)
[facility] PHYSICAL MEDICINE & REHABILITATION PROGRESS NOTE   Subjective/Complaints: [name] is up in his chair eating his breakfast this morning. Has a good appetite.  Showered during OT session this morning. Denies constipation, pain. Sleeping so-so.   ROS: Patient denies fever, rash, sore throat, blurred vision, nausea, vomiting, diarrhea, cough, shortness of breath or chest pain,  headache, or mood change.   Objective:   No results found. No results for input(s): WBC, HGB, HCT, PLT in the last 72 hours. Recent Labs    01/22/20 0513  CREATININE 0.99    Intake/Output Summary (Last 24 hours) at 01/23/2020 0929 Last data filed at 01/23/2020 2836 Gross per 24 hour  Intake 120 ml  Output 950 ml  Net -830 ml     Physical Exam: Vital Signs Blood pressure 102/64, pulse 72, temperature 97.7 F (36.5 C), temperature source Oral, resp. rate 18, height 6\' 1"  (1.854 m), weight 91.4 kg, SpO2 97 %. Constitutional: No distress . Vital signs reviewed. Sitting up in bed eating breakfast. Managing to feed himself very well.  HEENT: EOMI, oral membranes moist Neck: supple Cardiovascular: RRR without murmur. No JVD    Respiratory/Chest: CTA Bilaterally without wheezes or rales. Normal effort    GI/Abdomen: BS +, non-tender, non-distended Ext: no clubbing, cyanosis, or edema Psych: pleasant and cooperative for the most part Motor 0/5 RUE and RLE  Significant extensor tone LLE, ankle and knee--2/4 Musculoskeletal: Full PROM, No pain PROM in the neck, trunk, or extremities. Posture fair   Assessment/Plan: 1. Functional deficits secondary to right MCA infarct which require 3+ hours per day of interdisciplinary therapy in a comprehensive inpatient rehab setting.  Physiatrist is providing close team supervision and 24 hour management of active medical problems listed below.  Physiatrist and rehab team continue to assess barriers to discharge/monitor patient progress toward functional and  medical goals  Care Tool:  Bathing    Body parts bathed by patient: Right arm, Right lower leg, Left arm, Left lower leg, Chest, Face, Abdomen, Front perineal area, Buttocks, Right upper leg, Left upper leg   Body parts bathed by helper: Buttocks, Right arm     Bathing assist Assist Level: Moderate Assistance - Patient 50 - 74%     Upper Body Dressing/Undressing Upper body dressing   What is the patient wearing?: Pull over shirt    Upper body assist Assist Level: Moderate Assistance - Patient 50 - 74%    Lower Body Dressing/Undressing Lower body dressing      What is the patient wearing?: Pants, Underwear/pull up     Lower body assist Assist for lower body dressing: Moderate Assistance - Patient 50 - 74%     Toileting Toileting    Toileting assist Assist for toileting: Maximal Assistance - Patient 25 - 49% Assistive Device Comment: BSC over toilet in bathroom   Transfers Chair/bed transfer  Transfers assist     Chair/bed transfer assist level: Moderate Assistance - Patient 50 - 74%     Locomotion Ambulation   Ambulation assist   Ambulation activity did not occur: Safety/medical concerns          Walk 10 feet activity   Assist  Walk 10 feet activity did not occur: Safety/medical concerns        Walk 50 feet activity   Assist Walk 50 feet with 2 turns activity did not occur: Safety/medical concerns         Walk 150 feet activity   Assist Walk 150 feet activity did  not occur: Safety/medical concerns         Walk 10 feet on uneven surface  activity   Assist Walk 10 feet on uneven surfaces activity did not occur: Safety/medical concerns         Wheelchair     Assist Will patient use wheelchair at discharge?: Yes Type of Wheelchair: Manual    Wheelchair assist level: Supervision/Verbal cueing Max wheelchair distance: 150'    Wheelchair 50 feet with 2 turns activity    Assist        Assist Level:  Supervision/Verbal cueing   Wheelchair 150 feet activity     Assist      Assist Level: Supervision/Verbal cueing   Blood pressure 102/64, pulse 72, temperature 97.7 F (36.5 C), temperature source Oral, resp. rate 18, height 6\' 1"  (1.854 m), weight 91.4 kg, SpO2 97 %.  Medical Problem List and Plan: 1.  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  Status post TEE 01/10/2020 Hypercoag w/u looks neg, risk factor of smoking , mild LDL elevation              -patient may shower             -ELOS/Goals: 2-3 weeks; supervision to min assist  -Continue CIR  -PRAFO/WHO at HS--he's wearing  -Continue L knee immobilizer.  2.  Antithrombotics: -DVT/anticoagulation: Lovenox initiated 01/10/2020  -recent dopplers negative             -antiplatelet therapy: Aspirin 325 mg daily 3. Pain Management: Lidoderm patch, oxycodone 5 mg every 6 hours as needed 4. Mood: Advised emotional support  -neuropsych consults  -pt denies depression at this time             -antipsychotic agents: N/A 5. Neuropsych: This patient is capable of making decisions on his own behalf. 6. Skin/Wound Care: Routine skin checks 7. Fluids/Electrolytes/Nutrition: encourage PO  -continue to monitor  3/23 -recheck labs THursday 8.  Dysphagia.  on regular/thins diet and tolerating well, good appetite.  9.  HTN:  off BP meds - monitor   BP controlled, slightly low this morning 3/24.  10.  Hyperlipidemia.  Lipitor 11.  History of tobacco/alcohol/marijuana use.  NicoDerm patch.  Monitor for any signs of withdrawal provide counseling 12. Extensor tone LLE > LUE  3/23 -increase baclofen to 10mg  TID---observe for tolerance. 3/24: tolerating medication change well.   -tizanidine also started by call MD over weekend  -aggressive ROM with therapy   -PRAFO  LOS: 8 days A FACE TO FACE EVALUATION WAS PERFORMED   Derek [name] 01/23/2020, 9:29 AM

## 2020-01-23 NOTE — Progress Notes (Addendum)
Occupational Therapy Weekly Progress Note  Patient Details  Name: Derek [name] MRN: [id number] Date of Birth: 04/27/1976  Beginning of progress report period: January 16, 2020 End of progress report period: January 23, 2020  Today's Date: 01/23/2020 OT Individual Time: 7124-5809 OT Individual Time Calculation (min): 72 min    Patient has met 1 of 3 short term goals. Patient is making steady progress with BADLs, bed mobility and functional transfers with increased attention to L side requiring Min to Mod cueing for positioning of LUE and LLE prior to supine <> EOB and functional transfers (improvement from Max A upon initial eval). Patient currently able to complete squat pivot transfers with Mod A, however still requiring Max A for L knee block with sit to stand transfers unsupported. Patient completed sit to stand transfers in [place] with supervision A. Patient able to recall hemi-dressing techniques for UB/LB dressing with 65%-75% accuracy, requiring Mod A (UB) Max A (LB) and Min to Mod cueing for sequencing.   Patient continues to demonstrate the following deficits: muscle weakness, decreased cardiorespiratoy endurance, impaired timing and sequencing, abnormal tone, unbalanced muscle activation, ataxia, decreased coordination and decreased motor planning, decreased visual motor skills and decreased initiation, decreased attention, decreased awareness, decreased problem solving, decreased safety awareness, decreased memory and delayed processing and therefore will continue to benefit from skilled OT intervention to enhance overall performance with BADL and Reduce care partner burden.  Patient progressing toward long term goals..  Continue plan of care.  OT Short Term Goals Week 1:  OT Short Term Goal 1 (Week 1): Patient will don UB clothing with Min A and 1-3 cues for sequencing. OT Short Term Goal 1 - Progress (Week 1): Progressing toward goal OT Short Term Goal 2 (Week 1): Patient will  complete supine <> EOB with Min A in prep for completion of ADLs OT Short Term Goal 2 - Progress (Week 1): Progressing toward goal OT Short Term Goal 3 (Week 1): Patient will complete sit to stand transfers with Mod A and DME as needed in prep for completion of ADLs. OT Short Term Goal 3 - Progress (Week 1): Met Week 2:  OT Short Term Goal 1 (Week 2): Patient will don UB clothing with Min A and 1-3 cues for sequencing. OT Short Term Goal 2 (Week 2): Patient will complete supine <> EOB with Min A in prep for completion of ADLs. OT Short Term Goal 3 (Week 2): Pt will use RUE as stabilizer during grooming with Min multimodal cueing.   Skilled Therapeutic Interventions/Progress Updates:  Patient met lying supine in bed in agreement with OT treatment session. Skilled OT services with focus on self-care re-education and NMR as detailed below. Supine to EOB with Mod A to initiate bringing trunk upright. Patient completed sit to stand in [place] with supervision A. UB bathing with Min A and LB bathing with Max A in standing with use of grab bar. Patient education on placement of LLE prior to sit to stand transfers with ability to use RUE and RLE to assist. Patient demonstrates increased dynamic sitting balance with ability to bend forward to wash legs. Patient reports feeling looser in B hips with figure 4 position although still requires assistance to maintain. UB dressing seated EOB with Mod A and 3-4 cues for sequencing with use of hemi-dressing techniques, and LOB dressing seated EOB with Max A and L knee block to maintain standing. Patient able to don footwear seated EOB with Mod A and use of shoe  button. Session concluded with patient seated in wc with call bell within reach, chair alarm activated, and all needs met.   Therapy Documentation Precautions:  Precautions Precautions: Fall Precaution Comments: L neglect, hemiparesis Required Braces or Orthoses: Other Brace Other Brace: L knee  immobilizer Restrictions Weight Bearing Restrictions: No   Therapy/Group: Individual Therapy  Ronica Vivian [name] 01/23/2020, 7:39 AM

## 2020-01-24 ENCOUNTER — Inpatient Hospital Stay (HOSPITAL_COMMUNITY): Payer: BC Managed Care – PPO | Admitting: Speech Pathology

## 2020-01-24 ENCOUNTER — Inpatient Hospital Stay (HOSPITAL_COMMUNITY): Payer: BC Managed Care – PPO | Admitting: Physical Therapy

## 2020-01-24 ENCOUNTER — Inpatient Hospital Stay (HOSPITAL_COMMUNITY): Payer: BC Managed Care – PPO | Admitting: Occupational Therapy

## 2020-01-24 LAB — BASIC METABOLIC PANEL
Anion gap: 9 (ref 5–15)
BUN: 7 mg/dL (ref 6–20)
CO2: 25 mmol/L (ref 22–32)
Calcium: 9.2 mg/dL (ref 8.9–10.3)
Chloride: 109 mmol/L (ref 98–111)
Creatinine, Ser: 0.9 mg/dL (ref 0.61–1.24)
GFR calc Af Amer: >60 mL/min (ref >60)
GFR calc non Af Amer: >60 mL/min (ref >60)
Glucose, Bld: 96 mg/dL (ref 70–99)
Potassium: 4 mmol/L (ref 3.5–5.1)
Sodium: 143 mmol/L (ref 135–145)

## 2020-01-24 LAB — CBC
HCT: 41 % (ref 39.0–52.0)
Hemoglobin: 13.5 g/dL (ref 13.0–17.0)
MCH: 29.3 pg (ref 26.0–34.0)
MCHC: 32.9 g/dL (ref 30.0–36.0)
MCV: 88.9 fL (ref 80.0–100.0)
Platelets: 359 10*3/uL (ref 150–400)
RBC: 4.61 MIL/uL (ref 4.22–5.81)
RDW: 16.3 % — ABNORMAL HIGH (ref 11.5–15.5)
WBC: 5.7 10*3/uL (ref 4.0–10.5)
nRBC: 0 % (ref 0.0–0.2)

## 2020-01-24 NOTE — Progress Notes (Signed)
Occupational Therapy Session Note  Patient Details  Name: Derek [name] MRN: [id number] Date of Birth: 11/04/1975  Today's Date: 01/24/2020 OT Individual Time: 0853-1001 OT Individual Time Calculation (min): 68 min    Short Term Goals: Week 2:  OT Short Term Goal 1 (Week 2): Patient will don UB clothing with Min A and 1-3 cues for sequencing. OT Short Term Goal 2 (Week 2): Patient will complete supine <> EOB with Min A in prep for completion of ADLs. OT Short Term Goal 3 (Week 2): Pt will use RUE as stabilizer during grooming with Min multimodal cueing.  Skilled Therapeutic Interventions/Progress Updates:  Session 1: Patient met lying in supine in agreement with OT treatment session. Noted poor position with patient's trunk pushed up against L bed rail and BLE nearly off the bed. OT provided eduction on self-repositioning as patient is able to bridge with RLE and can reposition trunk with RUE. Patient able to reposition self in bed with supervision A and Mod-Max verbal/tactile cues for sequencing and use of RLE to reposition LLE. Patient with questions about the kind of stroke he had, why it happened and rehab outcomes. With patient seated EOB, OT provided education with handouts in Stroke recovery book located in patients room. All questions answered. OT provided education for writing down questions in prep for MD's morning visit with patient expressing verbal understanding. Squat-pivot transfer to L with mod A and cueing for foot placement, hand placement and attention to L prior to transfers.   Session 2: Wc mobility to therapy gym with supervision A and cueing for navigating around obstacles and through doorways. Squat-pivot transfer to and from mat table with mod A . OT education on walker orthotic to support L hand on RW. Patient completed 3 sit to stand transfers from mat table to RW with Max A progressing to Mod A and Mod-Max cueing for weight shifting, midline orientation, and foot  placement. Patient transported back to room with total A for time management. Session concluded with patient seated in wc with call bell within reach, belt alarm activated, and all needs met.   Therapy Documentation Precautions:  Precautions Precautions: Fall Precaution Comments: L neglect, hemiparesis Required Braces or Orthoses: Other Brace Other Brace: L knee immobilizer Restrictions Weight Bearing Restrictions: No   Therapy/Group: Individual Therapy  Shafin Pollio [name] 01/24/2020, 7:57 AM

## 2020-01-24 NOTE — Progress Notes (Signed)
Speech Language Pathology Daily Session Note  Patient Details  Name: Derek [name] MRN: [id number] Date of Birth: [birth year]  Today's Date: 01/24/2020 SLP Individual Time: 1400-1455 SLP Individual Time Calculation (min): 55 min  Short Term Goals: Week 2: SLP Short Term Goal 1 (Week 2): Patient will demonstrate sustained attention to functional tasks for 45 minutes with Min verbal cues for redirection. SLP Short Term Goal 2 (Week 2): Patient will recall new, daily information with Min verbal cues. SLP Short Term Goal 3 (Week 2): Patient will self-monitor and correct errors during functional tasks with Min verbal cues. SLP Short Term Goal 4 (Week 2): Patient will demonstrate functional problem solving for mildly complex tasks with Min verbal cues.  Skilled Therapeutic Interventions: Skilled treatment session focused on cognitive goals. SLP facilitated session by extra time and Min-Mod A verbal cues for complex problem solving and organization during a scheduling task.  Patient demonstrated increased accuracy with task due to increased ability to organize information into a list. Patient left upright in wheelchair with alarm on and all needs within reach. Continue with current plan of care.      Pain No/Denies Pain   Therapy/Group: Individual Therapy  Grizel Vesely 01/24/2020, 3:08 PM

## 2020-01-24 NOTE — Progress Notes (Signed)
[place] PHYSICAL MEDICINE & REHABILITATION PROGRESS NOTE   Subjective/Complaints: In bed. Had a fair night. Doesn't like to wear PRAFO as it hurts/keeps him awake. Spasms better when NOT in PRAFO. Asked about cause of stroke  ROS: Patient denies fever, rash, sore throat, blurred vision, nausea, vomiting, diarrhea, cough, shortness of breath or chest pain, joint or back pain, headache, or mood change. .   Objective:   No results found. Recent Labs    01/24/20 0532  WBC 5.7  HGB 13.5  HCT 41.0  PLT 359   Recent Labs    01/22/20 0513 01/24/20 0532  NA  --  143  K  --  4.0  CL  --  109  CO2  --  25  GLUCOSE  --  96  BUN  --  7  CREATININE 0.99 0.90  CALCIUM  --  9.2    Intake/Output Summary (Last 24 hours) at 01/24/2020 1015 Last data filed at 01/24/2020 0827 Gross per 24 hour  Intake 620 ml  Output --  Net 620 ml     Physical Exam: Vital Signs Blood pressure 116/79, pulse 75, temperature 97.9 F (36.6 C), resp. rate 19, height 6\' 1"  (1.854 m), weight 91.4 kg, SpO2 98 %. Constitutional: No distress . Vital signs reviewed. HEENT: EOMI, oral membranes moist Neck: supple Cardiovascular: RRR without murmur. No JVD    Respiratory/Chest: CTA Bilaterally without wheezes or rales. Normal effort    GI/Abdomen: BS +, non-tender, non-distended Ext: no clubbing, cyanosis, or edema Psych: flat, cooperative Motor 0/5 RUE and RLE  Significant extensor tone LLE, ankle and knee, a little better but still-MAS 2/4 Musculoskeletal: Full PROM, No pain PROM in the neck, trunk, or extremities. Able to passively range left ankle to neutral.    Assessment/Plan: 1. Functional deficits secondary to right MCA infarct which require 3+ hours per day of interdisciplinary therapy in a comprehensive inpatient rehab setting.  Physiatrist is providing close team supervision and 24 hour management of active medical problems listed below.  Physiatrist and rehab team continue to assess barriers  to discharge/monitor patient progress toward functional and medical goals  Care Tool:  Bathing    Body parts bathed by patient: Right arm, Right lower leg, Left arm, Left lower leg, Chest, Face, Abdomen, Front perineal area, Buttocks, Right upper leg, Left upper leg   Body parts bathed by helper: Buttocks, Right arm     Bathing assist Assist Level: Moderate Assistance - Patient 50 - 74%     Upper Body Dressing/Undressing Upper body dressing   What is the patient wearing?: Pull over shirt    Upper body assist Assist Level: Moderate Assistance - Patient 50 - 74%    Lower Body Dressing/Undressing Lower body dressing      What is the patient wearing?: Pants, Underwear/pull up     Lower body assist Assist for lower body dressing: Moderate Assistance - Patient 50 - 74%     Toileting Toileting    Toileting assist Assist for toileting: Maximal Assistance - Patient 25 - 49% Assistive Device Comment: BSC over toilet in bathroom   Transfers Chair/bed transfer  Transfers assist     Chair/bed transfer assist level: Moderate Assistance - Patient 50 - 74%     Locomotion Ambulation   Ambulation assist   Ambulation activity did not occur: Safety/medical concerns          Walk 10 feet activity   Assist  Walk 10 feet activity did not occur: Safety/medical concerns  Walk 50 feet activity   Assist Walk 50 feet with 2 turns activity did not occur: Safety/medical concerns         Walk 150 feet activity   Assist Walk 150 feet activity did not occur: Safety/medical concerns         Walk 10 feet on uneven surface  activity   Assist Walk 10 feet on uneven surfaces activity did not occur: Safety/medical concerns         Wheelchair     Assist Will patient use wheelchair at discharge?: Yes Type of Wheelchair: Manual    Wheelchair assist level: Supervision/Verbal cueing Max wheelchair distance: 150'    Wheelchair 50 feet with 2 turns  activity    Assist        Assist Level: Supervision/Verbal cueing   Wheelchair 150 feet activity     Assist      Assist Level: Supervision/Verbal cueing   Blood pressure 116/79, pulse 75, temperature 97.9 F (36.6 C), resp. rate 19, height 6\' 1"  (1.854 m), weight 91.4 kg, SpO2 98 %.  Medical Problem List and Plan: 1.  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  Status post negative TEE 01/10/2020  -discussed with patient that work up was negative for embolic source. He will need to modify risk factors of smoking , mild LDL elevation, further assessment of stroke as outpt              -patient may shower             -ELOS/Goals: 4/14  -Continue CIR  -PRAFO/WHO at HS--he's wearing-can remove PRAFO for sleep  -Continue L knee immobilizer.  2.  Antithrombotics: -DVT/anticoagulation: Lovenox initiated 01/10/2020  -recent dopplers negative             -antiplatelet therapy: Aspirin 325 mg daily 3. Pain Management: Lidoderm patch, oxycodone 5 mg every 6 hours as needed 4. Mood: Advised emotional support  -neuropsych consults  -pt denies depression at this time             -antipsychotic agents: N/A 5. Neuropsych: This patient is capable of making decisions on his own behalf. 6. Skin/Wound Care: Routine skin checks 7. Fluids/Electrolytes/Nutrition: encourage PO  -continue to monitor  3/25 labs reviewed and WNL today.  8.  Dysphagia.  on regular/thins diet and tolerating well, good appetite.  9.  HTN:  off BP meds - monitor   BP controlled, 3/25.  10.  Hyperlipidemia.  Lipitor 11.  History of tobacco/alcohol/marijuana use.  NicoDerm patch.  reviewed tobacco/drug use in regard to stroke risk 12. Extensor tone LLE > LUE  3/23 -increase baclofen to 10mg  TID---observe for tolerance. 3/24: tolerating medication change well.   -tizanidine also started by call MD over weekend  -aggressive  ROM with therapy   -PRAFO---can take off for sleep  LOS: 9 days A FACE TO FACE EVALUATION WAS PERFORMED  01/24/2020, 10:15 AM

## 2020-01-25 ENCOUNTER — Inpatient Hospital Stay (HOSPITAL_COMMUNITY): Payer: BC Managed Care – PPO | Admitting: Physical Therapy

## 2020-01-25 ENCOUNTER — Inpatient Hospital Stay (HOSPITAL_COMMUNITY): Payer: BC Managed Care – PPO | Admitting: Occupational Therapy

## 2020-01-25 ENCOUNTER — Inpatient Hospital Stay (HOSPITAL_COMMUNITY): Payer: BC Managed Care – PPO

## 2020-01-25 ENCOUNTER — Encounter (HOSPITAL_COMMUNITY): Payer: BC Managed Care – PPO | Admitting: Psychology

## 2020-01-25 NOTE — Progress Notes (Signed)
Occupational Therapy Session Note  Patient Details  Name: Derek [name] MRN: [id number] Date of Birth: 1976/10/16  1445-1515 30 min  Short Term Goals: Week 2:  OT Short Term Goal 1 (Week 2): Patient will don UB clothing with Min A and 1-3 cues for sequencing. OT Short Term Goal 2 (Week 2): Patient will complete supine <> EOB with Min A in prep for completion of ADLs. OT Short Term Goal 3 (Week 2): Pt will use RUE as stabilizer during grooming with Min multimodal cueing.  Skilled Therapeutic Interventions/Progress Updates:    1:1. Pt received in recliner agreeable to tx. Squat pivot transfer recliner<>w/c<>EOM with MIN-MOD A. MOD A only returning to recliner without L knee blocked and begins to buckle. Pt completes 10 sit ups from wedge, 10 lateral taps to R for L trunk strengthening, and WB through L forearm>press up to midline with no visible LUE activation. Pt completes 5 sit to stand and 5 mini squats with standard chair back in front of pt and L knee block for quad activation, equal weight shifting and LE strengthening required for functional transfers. Exited session with pt seated in recline, exit alarm on and call light inreach Therapy Documentation Precautions:  Precautions Precautions: Fall Precaution Comments: L neglect, hemiparesis Required Braces or Orthoses: Other Brace Other Brace: L knee immobilizer Restrictions Weight Bearing Restrictions: No General:   Vital Signs: Therapy Vitals Temp: 98.4 F (36.9 C) Temp Source: Oral Pulse Rate: 79 Resp: 18 BP: 108/76 Patient Position (if appropriate): Sitting Oxygen Therapy SpO2: 98 % O2 Device: Room Air Pain: Pain Assessment Pain Scale: 0-10 Pain Score: 2  ADL: ADL Grooming: Minimal assistance Where Assessed-Grooming: Sitting at sink Upper Body Bathing: Moderate assistance Where Assessed-Upper Body Bathing: Edge of bed Lower Body Bathing: Maximal assistance Where Assessed-Lower Body Bathing: Edge of bed Upper  Body Dressing: Moderate assistance Where Assessed-Upper Body Dressing: Edge of bed Lower Body Dressing: Maximal assistance Where Assessed-Lower Body Dressing: Edge of bed Toileting: Maximal assistance Where Assessed-Toileting: Teacher, adult education: Maximal Dentist Method: Surveyor, minerals: Tourist information centre manager    Praxis   Exercises:   Other Treatments:     Therapy/Group: Individual Therapy  Shon Hale 01/25/2020, 3:18 PM

## 2020-01-25 NOTE — Progress Notes (Signed)
Physical Therapy Session Note  Patient Details  Name: Derek [name] MRN: [id number] Date of Birth: 30-Nov-1975  Today's Date: 01/24/2020 PT Individual Time: 1110-1205   55 min   Short Term Goals: Week 2:  PT Short Term Goal 1 (Week 2): Pt will initiate gait training PT Short Term Goal 2 (Week 2): Pt will maintain static standing balance w/ min assist consistently PT Short Term Goal 3 (Week 2): Pt will maintain dynamic sitting balance w/ supervision Week 3:     Skilled Therapeutic Interventions/Progress Updates:    Pt received sitting on toilet and agreeable to PT. Pt had completed bowel movement. Sit<>stand in stedy with CGA for PT to perform pericare and assist with clothing management, while pt maintained RUE support.   PT applied Tband wrap to LLE. Sit<>stand at rail in hall x 3 wqith mod assist and approximation forces to force WB through LLE. pregait stepping with RLE to force WB through LLE. Max assist and max cues initially to engage LE extensors, but noted to have improved activation with increased time. Gait training at rail in hall 71f+12ft with max assist+2 with Tband wrap for ankle DF/knee flexion/hip flexion in swing and improve proprioceptive feedback to the LLE. intermittient activation in the LLE in stance noted.   nustep BLE only with x 8 min. Pt initially using heel of RLE to control LLE. PT positioned RLE to prevent compensation. Pt able to extend LLE with PT to assist with initiation 75% ot the time and mod assisst to improve hip alignment  [facility] to [facility] and all needs met.   .       Therapy Documentation Precautions:  Precautions Precautions: Fall Precaution Comments: L neglect, hemiparesis Required Braces or Orthoses: Other Brace Other Brace: L knee immobilizer Restrictions Weight Bearing Restrictions: No    Vital Signs: Therapy Vitals Temp: 97.8 F (36.6 C) Pulse Rate: 85 Resp: 18 BP:  106/78 Patient Position (if appropriate): Lying Oxygen Therapy SpO2: 98 % O2 Device: Room Air Pain:   denies    Therapy/Group: Individual Therapy  ALorie Phenix3/26/2021, 8:52 AM

## 2020-01-25 NOTE — Progress Notes (Signed)
Occupational Therapy Session Note  Patient Details  Name: Derek [name] MRN: [id number] Date of Birth: 1976/07/07  Today's Date: 01/25/2020 OT Individual Time: 0700-0800 OT Individual Time Calculation (min): 60 min    Short Term Goals: Week 1:  OT Short Term Goal 1 (Week 1): Patient will don UB clothing with Min A and 1-3 cues for sequencing. OT Short Term Goal 1 - Progress (Week 1): Progressing toward goal OT Short Term Goal 2 (Week 1): Patient will complete supine <> EOB with Min A in prep for completion of ADLs OT Short Term Goal 2 - Progress (Week 1): Progressing toward goal OT Short Term Goal 3 (Week 1): Patient will complete sit to stand transfers with Mod A and DME as needed in prep for completion of ADLs. OT Short Term Goal 3 - Progress (Week 1): Met  Skilled Therapeutic Interventions/Progress Updates:    1;1. Pt received in bed agreeable to tx requesting to shower. Pt stands in stedy to void into urinal with CGA for standing balance iwht LUE in WB position. Pt complete bathing with MIN A to wash buttocks and sit to stand with RUE on grab bar from [facility] in shower. Pt able to use compensatory strategies to wash RUE. Pt dons shirt with mod VC for orientation of shirt and thread BLE into pants/underwear crossing LLE at ankle to initially get pant over LLE. Sit to stand iwht MIN A as OT advances pants past hips. Grooming at sink with 1 handed tooth brushing technique demo to apply toothpaste. Pt completes socks donning with A to hold LE in figure 4. Exited session with saebo stim applied for 60 min unattended. Skin in tact, call light in reach and all needs met  Saebo Stim One 330 pulse width 35 Hz pulse rate On 8 sec/ off 8 sec Ramp up/ down 2 sec Symmetrical Biphasic wave form  Max intensity 143m at 500 Ohm load   Therapy Documentation Precautions:  Precautions Precautions: Fall Precaution Comments: L neglect, hemiparesis Required Braces or Orthoses: Other Brace Other Brace:  L knee immobilizer Restrictions Weight Bearing Restrictions: No General:   Vital Signs: Therapy Vitals Temp: 97.8 F (36.6 C) Pulse Rate: 85 Resp: 18 BP: 106/78 Patient Position (if appropriate): Lying Oxygen Therapy SpO2: 98 % O2 Device: Room Air Pain:   ADL: ADL Grooming: Minimal assistance Where Assessed-Grooming: Sitting at sink Upper Body Bathing: Moderate assistance Where Assessed-Upper Body Bathing: Edge of bed Lower Body Bathing: Maximal assistance Where Assessed-Lower Body Bathing: Edge of bed Upper Body Dressing: Moderate assistance Where Assessed-Upper Body Dressing: Edge of bed Lower Body Dressing: Maximal assistance Where Assessed-Lower Body Dressing: Edge of bed Toileting: Maximal assistance Where Assessed-Toileting: TGlass blower/designer Maximal aPrint production plannerMethod: SArts development officer BArt gallery manager   Praxis   Exercises:   Other Treatments:     Therapy/Group: Individual Therapy  STonny Branch3/26/2021, 7:58 AM

## 2020-01-25 NOTE — Progress Notes (Signed)
Physical Therapy Session Note  Patient Details  Name: Derek [name] MRN: [id number] Date of Birth: [birth year]  Today's Date: 01/25/2020 PT Individual Time: 0850-0930 PT Individual Time Calculation (min): 40 min   Short Term Goals: Week 2:  PT Short Term Goal 1 (Week 2): Pt will initiate gait training PT Short Term Goal 2 (Week 2): Pt will maintain static standing balance w/ min assist consistently PT Short Term Goal 3 (Week 2): Pt will maintain dynamic sitting balance w/ supervision  Skilled Therapeutic Interventions/Progress Updates:   Pt in w/c and agreeable to therapy, no c/o pain. Therapist donned shoes w/ total assist. Pt self-propelled w/c to therapy gym w/ supervision via R hemi technique. Sit<>stand at rail w/ min-mod assist to stand and maintain static balance. Worked on pre-gait tasks and LLE NMR. Performed R forward and backward stepping, 20+ reps w/ mod assist for lateral weight shifting and max assist to block L knee. Palpable L quad activation with this task. Performed lateral weight shifting in static stance w/ less activation noted, suspect pt is compensating w/ weight on RLE. Added DF ACE wrap to assist w/ DF control and worked on hip flexor activation while kicking ball for visual feedback. Able to kick ball 20+ reps in standing w/o assist, mild compensations at trunk but palpable hip flexion activation. Ambulated 15' w/ max assist for LLE management. Poor carryover of L hip flexion for swing limb advancement. Performed kinetron in seated @ 90 cm/sec, 1 min x3 reps for reciprocal movement pattern and to assess for LLE muscle activation. Pt able to perform task w/o physical assist, improved from previous sessions with this therapist. Returned to room total assist, ended session in w/c and all needs in reach.   Therapy Documentation Precautions:  Precautions Precautions: Fall Precaution Comments: L neglect, hemiparesis Required Braces or Orthoses: Other Brace Other Brace: L  knee immobilizer Restrictions Weight Bearing Restrictions: No  Therapy/Group: Individual Therapy  Derek [name] [name] 01/25/2020, 11:56 AM

## 2020-01-25 NOTE — Progress Notes (Signed)
Speech Language Pathology Daily Session Note  Patient Details  Name: Derek [name] MRN: [id number] Date of Birth: 1976/07/15  Today's Date: 01/25/2020 SLP Individual Time: 1225-1240 SLP Individual Time Calculation (min): 15 min  Short Term Goals: Week 2: SLP Short Term Goal 1 (Week 2): Patient will demonstrate sustained attention to functional tasks for 45 minutes with Min verbal cues for redirection. SLP Short Term Goal 2 (Week 2): Patient will recall new, daily information with Min verbal cues. SLP Short Term Goal 3 (Week 2): Patient will self-monitor and correct errors during functional tasks with Min verbal cues. SLP Short Term Goal 4 (Week 2): Patient will demonstrate functional problem solving for mildly complex tasks with Min verbal cues.  Skilled Therapeutic Interventions: Skilled treatment session focused on cognitive goals. When SLP was walking by the patient's room, the food ambassador was having difficulty setting up the patient with his lunch meal. SLP facilitated session by providing optimal positioning for PO intake. However, patient was Mod I to open all containers and for problem solving with tray set-up. Patient completed his lunch meal of regular textures with thin liquids without overt s/s of aspiration and overall Mod I. Patient left upright in wheelchair with all needs within reach. Continue with current plan of care.      Pain No/Denies Pain  Therapy/Group: Individual Therapy  Dalbert Stillings 01/25/2020, 3:39 PM

## 2020-01-25 NOTE — Progress Notes (Signed)
Occupational Therapy Session Note  Patient Details  Name: Derek [name] MRN: [id number] Date of Birth: Jul 11, 1976  Today's Date: 01/25/2020 OT Individual Time: 1300-1415 OT Individual Time Calculation (min): 75 min    Short Term Goals: Week 2:  OT Short Term Goal 1 (Week 2): Patient will don UB clothing with Min A and 1-3 cues for sequencing. OT Short Term Goal 2 (Week 2): Patient will complete supine <> EOB with Min A in prep for completion of ADLs. OT Short Term Goal 3 (Week 2): Pt will use RUE as stabilizer during grooming with Min multimodal cueing.  Skilled Therapeutic Interventions/Progress Updates:  Patient met seated in wc in agreement with OT treatment session. Skilled OT services provided with focus on functional transfers, bed mobility, and NMR as detailed below. Wc mobility to therapy gym with supervision A and Min vc's for navigating around obstacles. Patient able to complete squat-pivot transfers to R <> L from wc to mat table with Min-Mod A and L knee block. Sit to stand transfers from mat table x4 trials with multimodal cueing for normalized movement patterns, orientation to midline, power negotiation, and pushing from base of support with yoga block positioned between feet to maintain shoulder width apart.  OT provided education on self-ROM to facilitate elongation of soft tissue. Patient completed 1 set x 10 reps each for shoulder flexion, elbow flex/ext, shoulder abd/add, wrist flex, ext, and radial/ulnar deviation. OT to provide HEP in print next treatment session. Therapeutic activity for NMR with weight bearing through LUE and LLE seated at edge of mat. Total assist for transport back to room for time management. Squat-pivot transfer to recliner on R with Min A. Session concluded with patient seated in recliner with call bell within reach, belt alarm activated, and all needs met.   Therapy Documentation Precautions:  Precautions Precautions: Fall Precaution Comments: L  neglect, hemiparesis Required Braces or Orthoses: Other Brace Other Brace: L knee immobilizer Restrictions Weight Bearing Restrictions: No  Therapy/Group: Individual Therapy  Deloss Amico [name] 01/25/2020, 12:09 PM

## 2020-01-25 NOTE — Consult Note (Signed)
Neuropsychological Consultation   Patient:   Derek [name]   DOB:   11-05-1975  MR Number:  [id number]  Location:  [facility] [address] CENTER A [address] [id number] [place] [address] Dept: [phone] Loc: [phone]           Date of Service:   01/25/2020  Start Time:   10 AM End Time:   11 AM  Provider/Observer:  Arley Phenix, Psy.D.       Clinical Neuropsychologist       Billing Code/Service: [phone] 4 Units  Chief Complaint:    Jameek Bruntz is a 44 year old male with a history of alcohol/tobacco/marijuana use as well as chronic back pain.  Patient has been working as a Agricultural consultant.  The patient presented on 01/07/2020 with left hemiparesis.  Elevated blood pressure was noted.  Cranial CT scan showed right MCA infarction with mass-effect possible petechial hemorrhage in portions of the right basal ganglia.  Infarcts extended to the posterior cerebral right frontal and adjacent right parietal lobe.  Thrombosis/embolus in the M1 segment of right middle cerebral artery noted.  MRI showed acute right MCA infarction involving basal ganglia and right parietal lobe.  Hemorrhagic transformation of the right basal ganglia infarction with mass-effect on the lateral ventricle.  Follow-up repeat CT scan on 3/10 involving right MCA infarction again with hemorrhagic transformation, 3 mm right to left shift.  By 3/11 there was no new hemorrhage hydrocephalus or worsening mass-effect.  Once therapy evaluations were completed the patient was admitted for the comprehensive rehabilitation program.  Reason for Service:  The patient was referred for neuropsychological consultation due to coping and adjustment issues.  Below is the HPI for the current mission.  HPI: Derek [name] is a 44 year old right-handed male with history of alcohol/tobacco/marijuana use as well as chronic back pain. History taken from chart  review. Patient lives with girlfriend and independent prior to admission. He is employed as a Agricultural consultant. Presented 01/07/2020 with left hemiparesis. Blood pressure 184/110. Cranial CT scan showed right MCA infarction with mass-effect possible petechial hemorrhage in portions of the right basal ganglia. Infarcts extending to the posterior superior right frontal and adjacent right parietal lobe. Suspect thrombus/embolus in the M1 segment of right middle cerebral artery. MRI showed acute right MCA infarction involving basal ganglia and right parietal lobe. Hemorrhagic transformation of the right basal ganglia infarction with mass-effect on the lateral ventricle. Echocardiogram with ejection fraction 65%. Lower extremity Dopplers negative for DVT. Admission chemistries unremarkable except glucose 134, WBC 14,300, CK 787, blood cultures no growth to date, lactic acid within normal limits 1.9, urine culture no growth, urine drug screen positive marijuana. Neurology consulted with TEE completed 01/10/2020 showing no PFO /Thrombus. and currently maintained on aspirin 325 mg daily for CVA prophylaxis. Hold on DAPT at this time given hemorrhagic transformation. Follow-up repeat cranial CT scan 01/09/2020 evolving right MCA infarction again with hemorrhagic transformation, 3 mm right to left shift. No plan for 3% saline at this time and scan was again completed 01/10/2020 showing no new hemorrhage hydrocephalus or worsening mass-effect. Lovenox was initiated for DVT prophylaxis 01/10/2020. Patient did have a low-grade fever 100.5 leukocytosis 14,300 improved to 7.0. Urine culture no growth urinalysis negative blood cultures no growth to date. Therapy evaluations completed and patient was admitted for a comprehensive rehab program   Current Status:  The patient was very pleasant and cooperative and well oriented today but was not  able to give a any depth to his understanding about what happened beyond him having a  stroke.  The patient was oriented with a rather flat affect today.  He denied any significant symptoms of visual neglect but did describe motor deficits and some sensation of deficits on left side.  The patient reports that he is coping adequately with the sudden change.  The patient does report that he had some significant stress initially when he was developing symptoms.  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  The patient was there was some when he was having a massive heart attack and died while he was there.  He reports that when he started feeling sensations in his left arm that initially feared that he may be having a heart attack.  He had recently had a DOT evaluation where they noted elevated blood pressure.  He had also been having some medical work-ups for iron deficiency in the were concerned for internal bleeding of some type but because of insurance issues he had not had any type of endoscopy or colonoscopy to determine the culprit.  Behavioral Observation: [name]  presents as a 44 y.o.-year-old Right African American Male who appeared his stated age. his dress was Appropriate and he was Well Groomed and his manners were Appropriate to the situation.  his participation was indicative of Appropriate and Attentive behaviors.  There were any physical disabilities noted.  he displayed an appropriate level of cooperation and motivation.     Interactions:    Active Appropriate and Attentive  Attention:   within normal limits and attention span and concentration were age appropriate  Memory:   within normal limits; recent and remote memory intact  Visuo-spatial:  not examined  Speech (Volume):  low  Speech:   normal; normal  Thought Process:  Coherent and Relevant  Though Content:  WNL; not suicidal and not homicidal  Orientation:   person,  place, time/date and situation  Judgment:   Good  Planning:   Good  Affect:    Blunted and Lethargic  Mood:    Dysphoric  Insight:   Fair  Intelligence:   normal  Substance Use:  There is a documented history of alcohol, marijuana and tobacco abuse confirmed by the patient.    Medical History:   Past Medical History:  Diagnosis Date  . Acute ischemic right MCA stroke ([place]) 01/07/2020  . Alcohol dependence ([place])   . Back pain   . Marijuana dependence ([place])   . Tobacco dependence        Abuse/Trauma History: The patient did talk about an event that happened at work some time ago in which one of his coworkers developed chest pain and the patient was there to try to help out.  The patient called 911 and had the coworker sit down.  The patient's coworker was having a massive heart attack and died before EMS was able to arrive at the scene with the patient they are taking care of him.  Psychiatric History:  No prior psychiatric symptoms noted  Family Med/Psych History:  Family History  Problem Relation Age of Onset  . Stroke Neg Hx   . CAD Neg Hx     Risk of Suicide/Violence: virtually non-existent the patient denies any suicidal or homicidal ideation.  Impression/DX:  Alvira Philips is  a 44 year old male with a history of alcohol/tobacco/marijuana use as well as chronic back pain.  Patient has been working as a Agricultural consultant.  The patient presented on 01/07/2020 with left hemiparesis.  Elevated blood pressure was noted.  Cranial CT scan showed right MCA infarction with mass-effect possible petechial hemorrhage in portions of the right basal ganglia.  Infarcts extended to the posterior cerebral right frontal and adjacent right parietal lobe.  Thrombosis/embolus in the M1 segment of right middle cerebral artery noted.  MRI showed acute right MCA infarction involving basal ganglia and right parietal lobe.  Hemorrhagic transformation of the right basal ganglia infarction  with mass-effect on the lateral ventricle.  Follow-up repeat CT scan on 3/10 involving right MCA infarction again with hemorrhagic transformation, 3 mm right to left shift.  By 3/11 there was no new hemorrhage hydrocephalus or worsening mass-effect.  Once therapy evaluations were completed the patient was admitted for the comprehensive rehabilitation program.  The patient was very pleasant and cooperative and well oriented today but was not able to give a any depth to his understanding about what happened beyond him having a stroke.  The patient was oriented with a rather flat affect today.  He denied any significant symptoms of visual neglect but did describe motor deficits and some sensation of deficits on left side.  The patient reports that he is coping adequately with the sudden change.  The patient does report that he had some significant stress initially when he was developing symptoms.  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  The patient was there was some when he was having a massive heart attack and died while he was there.  He reports that when he started feeling sensations in his left arm that initially feared that he may be having a heart attack.  He had recently had a DOT evaluation where they noted elevated blood pressure.  He had also been having some medical work-ups for iron deficiency in the were concerned for internal bleeding of some type but because of insurance issues he had not had any type of endoscopy or colonoscopy to determine the culprit.  Disposition/Plan:  Will follow up with the patient next week and continue to work on coping and adjustment issues with her residual motor deficits and coping with residual effects of his stroke.  Diagnosis:    Right middle cerebral artery stroke [facility]) - Plan: Ambulatory referral to Neurology          Electronically Signed   _______________________ Arley Phenix, Psy.D.

## 2020-01-25 NOTE — Progress Notes (Signed)
[place] PHYSICAL MEDICINE & REHABILITATION PROGRESS NOTE   Subjective/Complaints: Had a better night. Wore PRAFO in the evening b/f bed. Spasms seem a little better  ROS: Patient denies fever, rash, sore throat, blurred vision, nausea, vomiting, diarrhea, cough, shortness of breath or chest pain, joint or back pain, headache, or mood change.    Objective:   No results found. Recent Labs    01/24/20 0532  WBC 5.7  HGB 13.5  HCT 41.0  PLT 359   Recent Labs    01/24/20 0532  NA 143  K 4.0  CL 109  CO2 25  GLUCOSE 96  BUN 7  CREATININE 0.90  CALCIUM 9.2    Intake/Output Summary (Last 24 hours) at 01/25/2020 1027 Last data filed at 01/24/2020 2000 Gross per 24 hour  Intake 420 ml  Output 450 ml  Net -30 ml     Physical Exam: Vital Signs Blood pressure 106/78, pulse 85, temperature 97.8 F (36.6 C), resp. rate 18, height 6\' 1"  (1.854 m), weight 91.4 kg, SpO2 98 %. Constitutional: No distress . Vital signs reviewed. HEENT: EOMI, oral membranes moist Neck: supple Cardiovascular: RRR without murmur. No JVD    Respiratory/Chest: CTA Bilaterally without wheezes or rales. Normal effort    GI/Abdomen: BS +, non-tender, non-distended Ext: no clubbing, cyanosis, or edema Psych: a little more engaging Motor 0/5 RUE and RLE  Significant extensor tone LLE, MAS 2/4 Musculoskeletal: Full PROM, No pain PROM in the neck, trunk, or extremities. Able to passively range left ankle to neutral.    Assessment/Plan: 1. Functional deficits secondary to right MCA infarct which require 3+ hours per day of interdisciplinary therapy in a comprehensive inpatient rehab setting.  Physiatrist is providing close team supervision and 24 hour management of active medical problems listed below.  Physiatrist and rehab team continue to assess barriers to discharge/monitor patient progress toward functional and medical goals  Care Tool:  Bathing    Body parts bathed by patient: Right arm,  Right lower leg, Left arm, Left lower leg, Chest, Face, Abdomen, Front perineal area, Buttocks, Right upper leg, Left upper leg   Body parts bathed by helper: Buttocks, Right arm     Bathing assist Assist Level: Moderate Assistance - Patient 50 - 74%     Upper Body Dressing/Undressing Upper body dressing   What is the patient wearing?: Pull over shirt    Upper body assist Assist Level: Moderate Assistance - Patient 50 - 74%    Lower Body Dressing/Undressing Lower body dressing      What is the patient wearing?: Pants, Underwear/pull up     Lower body assist Assist for lower body dressing: Moderate Assistance - Patient 50 - 74%     Toileting Toileting    Toileting assist Assist for toileting: Maximal Assistance - Patient 25 - 49% Assistive Device Comment: BSC over toilet in bathroom   Transfers Chair/bed transfer  Transfers assist     Chair/bed transfer assist level: Moderate Assistance - Patient 50 - 74%     Locomotion Ambulation   Ambulation assist   Ambulation activity did not occur: Safety/medical concerns  Assist level: Maximal Assistance - Patient 25 - 49% Assistive device: Other (comment)(rail in hallway) Max distance: 15'   Walk 10 feet activity   Assist  Walk 10 feet activity did not occur: Safety/medical concerns  Assist level: Maximal Assistance - Patient 25 - 49% Assistive device: Other (comment)(rail in hallway)   Walk 50 feet activity   Assist Walk 50  feet with 2 turns activity did not occur: Safety/medical concerns         Walk 150 feet activity   Assist Walk 150 feet activity did not occur: Safety/medical concerns         Walk 10 feet on uneven surface  activity   Assist Walk 10 feet on uneven surfaces activity did not occur: Safety/medical concerns         Wheelchair     Assist Will patient use wheelchair at discharge?: Yes Type of Wheelchair: Manual    Wheelchair assist level: Supervision/Verbal  cueing Max wheelchair distance: 150'    Wheelchair 50 feet with 2 turns activity    Assist        Assist Level: Supervision/Verbal cueing   Wheelchair 150 feet activity     Assist      Assist Level: Supervision/Verbal cueing   Blood pressure 106/78, pulse 85, temperature 97.8 F (36.6 C), resp. rate 18, height 6\' 1"  (1.854 m), weight 91.4 kg, SpO2 98 %.  Medical Problem List and Plan: 1.  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  Status post negative TEE 01/10/2020  -have reviewed with patient that work up was negative for embolic source. He will need to modify risk factors of smoking , mild LDL elevation, further assessment of stroke as outpt              -patient may shower             -ELOS/Goals: 4/14  -Continue CIR  -PRAFO/WHO at HS-- -can remove PRAFO prior to sleep     2.  Antithrombotics: -DVT/anticoagulation: Lovenox initiated 01/10/2020  -recent dopplers negative             -antiplatelet therapy: Aspirin 325 mg daily 3. Pain Management: Lidoderm patch, oxycodone 5 mg every 6 hours as needed 4. Mood: Advised emotional support  -neuropsych consult  -pt denies depression at this time             -antipsychotic agents: N/A 5. Neuropsych: This patient is capable of making decisions on his own behalf. 6. Skin/Wound Care: Routine skin checks 7. Fluids/Electrolytes/Nutrition: encourage PO  -continue to monitor  3/25 labs reviewed and WNL  .  8.  Dysphagia.  on regular/thins diet and tolerating well, good appetite.  9.  HTN:  off BP meds - monitor   BP controlled, 3/26 10.  Hyperlipidemia.  Lipitor 11.  History of tobacco/alcohol/marijuana use.  NicoDerm patch.  reviewed tobacco/drug use in regard to stroke risk 12. 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 Continue current regeimen  LOS: 10 days A FACE TO FACE EVALUATION WAS PERFORMED  01/25/2020, 10:27 AM

## 2020-01-26 ENCOUNTER — Inpatient Hospital Stay (HOSPITAL_COMMUNITY): Payer: BC Managed Care – PPO | Admitting: Physical Therapy

## 2020-01-26 ENCOUNTER — Inpatient Hospital Stay (HOSPITAL_COMMUNITY): Payer: BC Managed Care – PPO

## 2020-01-26 DIAGNOSIS — Z8679 Personal history of other diseases of the circulatory system: Secondary | ICD-10-CM

## 2020-01-26 DIAGNOSIS — G8110 Spastic hemiplegia affecting unspecified side: Secondary | ICD-10-CM

## 2020-01-26 DIAGNOSIS — E785 Hyperlipidemia, unspecified: Secondary | ICD-10-CM

## 2020-01-26 NOTE — Progress Notes (Signed)
Occupational Therapy Session Note  Patient Details  Name: Derek [name] MRN: [id number] Date of Birth: [birth year]  Today's Date: 01/26/2020 OT Individual Time: 7253-6644 OT Individual Time Calculation (min): 57 min    Short Term Goals: Week 2:  OT Short Term Goal 1 (Week 2): Patient will don UB clothing with Min A and 1-3 cues for sequencing. OT Short Term Goal 2 (Week 2): Patient will complete supine <> EOB with Min A in prep for completion of ADLs. OT Short Term Goal 3 (Week 2): Pt will use RUE as stabilizer during grooming with Min multimodal cueing.  Skilled Therapeutic Interventions/Progress Updates:    1:1. Pt received in recliner agreeable ot OT. Pt dons long sleeve requiring MIN A and mod VC for hemi dressing techniques. Pt completes squat pivot transfers throughotu session with MOD-MIN A to R with L knee block. Pt completes supine AAROM with total A to move LUE on mat and no activation noted in supine positioning. Pt beginning to c/o anterior shoulder pain occasionally, but not constantly. Pt completes standing dynavision task with MOD A overall and VC for weight shfiting R to combat L lean in standing. Pt returned to room to and reviewed self ROM handout with mod VC for technique. Exited session with pt seated in recliner, saebo stim on L deltoid and all needs in reach/belt alarm on  Saebo Stim One 330 pulse width 35 Hz pulse rate On 8 sec/ off 8 sec Ramp up/ down 2 sec Symmetrical Biphasic wave form  Max intensity at 500 Ohm load Skin in tact after 60 min unattended  Therapy Documentation Precautions:  Precautions Precautions: Fall Precaution Comments: L neglect, hemiparesis Required Braces or Orthoses: Other Brace Other Brace: L knee immobilizer Restrictions Weight Bearing Restrictions: No General:   Vital Signs: Therapy Vitals Temp: 98.1 F (36.7 C) Temp Source: Oral Pulse Rate: 79 Resp: 18 BP: 103/74 Patient Position (if appropriate): Sitting Oxygen  Therapy SpO2: 100 % O2 Device: Room Air Pain: Pain Assessment Pain Scale: 0-10 Pain Score: 0-No pain ADL: ADL Grooming: Minimal assistance Where Assessed-Grooming: Sitting at sink Upper Body Bathing: Moderate assistance Where Assessed-Upper Body Bathing: Edge of bed Lower Body Bathing: Maximal assistance Where Assessed-Lower Body Bathing: Edge of bed Upper Body Dressing: Moderate assistance Where Assessed-Upper Body Dressing: Edge of bed Lower Body Dressing: Maximal assistance Where Assessed-Lower Body Dressing: Edge of bed Toileting: Maximal assistance Where Assessed-Toileting: Teacher, adult education: Maximal Dentist Method: Surveyor, minerals: Tourist information centre manager    Praxis   Exercises:   Other Treatments:     Therapy/Group: Individual Therapy  Shon Hale 01/26/2020, 12:18 PM

## 2020-01-26 NOTE — Progress Notes (Addendum)
Physical Therapy Session Note  Patient Details  Name: Derek [name] MRN: [id number] Date of Birth: [birth year]  Today's Date: 01/26/2020 PT Individual Time: 0900-0945 PT Individual Time Calculation (min): 45 min   Short Term Goals: Week 2:  PT Short Term Goal 1 (Week 2): Pt will initiate gait training PT Short Term Goal 2 (Week 2): Pt will maintain static standing balance w/ min assist consistently PT Short Term Goal 3 (Week 2): Pt will maintain dynamic sitting balance w/ supervision  Skilled Therapeutic Interventions/Progress Updates:   Pt in supine and agreeable to therapy, no c/o pain. Supine>sit w/ min assist. Pt w/ significantly increased L hamstring tone at beginning of session, did not limit him functionally, but needed some time to reach full standing and to stretch LLE into knee extension. Stand pivot to w/c w/ mod assist. Total assist w/c transport to/from therapy gym. Performed NuStep @ level 1 for LLE muscle activation, reciprocal movement pattern, and LLE tone management. Performed 3 min w/ BLEs and RUE assist, 3 min w/ BLEs only. Pt needed mod assist to maintain LLE on pedal 2/2 hamstring tone, but able to utilize LLE well within RUE assist. Performed supine bridges on mat w/ tactile and verbal cues for L glut and hamstring activation, 2x10 reps. Very trace muscle activation palpated. Returned to room via w/c. Set-up w/ unattended e-stim to L quad for 30 min. See details below. Ended session in w/c, all needs in reach. Pt tolerated e-stim well, skin intact.   Saebo Stim One 330 pulse width 35 Hz pulse rate On 8 sec/ off 8 sec Ramp up/ down 2 sec Symmetrical Biphasic wave form  Max intensity at 500 Ohm load  Therapy Documentation Precautions:  Precautions Precautions: Fall Precaution Comments: L neglect, hemiparesis Required Braces or Orthoses: Other Brace Other Brace: L knee immobilizer Restrictions Weight Bearing Restrictions: No  Therapy/Group: Individual  Therapy  Rafan Sanders [name] 01/26/2020, 9:48 AM

## 2020-01-26 NOTE — Progress Notes (Signed)
[facility] PHYSICAL MEDICINE & REHABILITATION PROGRESS NOTE   Subjective/Complaints: Patient seen laying in bed this morning.  He states he slept well overnight.  He states he is ready breakfast and asks me to set up his tray for him.  He states he wore his orthoses overnight.  ROS: Denies CP, SOB, N/V/D  Objective:   No results found. Recent Labs    01/24/20 0532  WBC 5.7  HGB 13.5  HCT 41.0  PLT 359   Recent Labs    01/24/20 0532  NA 143  K 4.0  CL 109  CO2 25  GLUCOSE 96  BUN 7  CREATININE 0.90  CALCIUM 9.2    Intake/Output Summary (Last 24 hours) at 01/26/2020 1241 Last data filed at 01/26/2020 0730 Gross per 24 hour  Intake 360 ml  Output 1425 ml  Net -1065 ml     Physical Exam: Vital Signs Blood pressure 103/74, pulse 79, temperature 98.1 F (36.7 C), temperature source Oral, resp. rate 18, height 6\' 1"  (1.854 m), weight 91.4 kg, SpO2 100 %. Constitutional: No distress . Vital signs reviewed. HENT: Normocephalic.  Atraumatic. Eyes: EOMI. No discharge. Cardiovascular: No JVD. Respiratory: Normal effort.  No stridor. GI: Non-distended. Skin: Warm and dry.  Intact. Psych: Normal mood.  Normal behavior. Musc: No edema in extremities.  No tenderness in extremities. Neuro: Alert Motor: RUE/RLE: 5/5 proximal distal LUE/LLE: 0/5 proximal distal Sensation intact to light touch No increase in tone appreciated in left upper extremity.   Assessment/Plan: 1. Functional deficits secondary to right MCA infarct which require 3+ hours per day of interdisciplinary therapy in a comprehensive inpatient rehab setting.  Physiatrist is providing close team supervision and 24 hour management of active medical problems listed below.  Physiatrist and rehab team continue to assess barriers to discharge/monitor patient progress toward functional and medical goals  Care Tool:  Bathing    Body parts bathed by patient: Right arm, Right lower leg, Left arm, Left lower leg,  Chest, Face, Abdomen, Front perineal area, Buttocks, Right upper leg, Left upper leg   Body parts bathed by helper: Buttocks, Right arm     Bathing assist Assist Level: Moderate Assistance - Patient 50 - 74%     Upper Body Dressing/Undressing Upper body dressing   What is the patient wearing?: Pull over shirt    Upper body assist Assist Level: Moderate Assistance - Patient 50 - 74%    Lower Body Dressing/Undressing Lower body dressing      What is the patient wearing?: Pants, Underwear/pull up     Lower body assist Assist for lower body dressing: Moderate Assistance - Patient 50 - 74%     Toileting Toileting    Toileting assist Assist for toileting: Maximal Assistance - Patient 25 - 49% Assistive Device Comment: BSC over toilet in bathroom   Transfers Chair/bed transfer  Transfers assist     Chair/bed transfer assist level: Moderate Assistance - Patient 50 - 74%     Locomotion Ambulation   Ambulation assist   Ambulation activity did not occur: Safety/medical concerns  Assist level: Maximal Assistance - Patient 25 - 49% Assistive device: Other (comment)(rail in hallway) Max distance: 15'   Walk 10 feet activity   Assist  Walk 10 feet activity did not occur: Safety/medical concerns  Assist level: Maximal Assistance - Patient 25 - 49% Assistive device: Other (comment)(rail in hallway)   Walk 50 feet activity   Assist Walk 50 feet with 2 turns activity did not occur: Safety/medical  concerns         Walk 150 feet activity   Assist Walk 150 feet activity did not occur: Safety/medical concerns         Walk 10 feet on uneven surface  activity   Assist Walk 10 feet on uneven surfaces activity did not occur: Safety/medical concerns         Wheelchair     Assist Will patient use wheelchair at discharge?: Yes Type of Wheelchair: Manual    Wheelchair assist level: Supervision/Verbal cueing Max wheelchair distance: 150'     Wheelchair 50 feet with 2 turns activity    Assist        Assist Level: Supervision/Verbal cueing   Wheelchair 150 feet activity     Assist      Assist Level: Supervision/Verbal cueing   Blood pressure 103/74, pulse 79, temperature 98.1 F (36.7 C), temperature source Oral, resp. rate 18, height 6\' 1"  (1.854 m), weight 91.4 kg, SpO2 100 %.  Medical Problem List and Plan: 1.  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  Status post negative TEE 01/10/2020  Continue CIR  -PRAFO/WHO at HS 2.  Antithrombotics: -DVT/anticoagulation: Lovenox initiated 01/10/2020  -recent dopplers negative             -antiplatelet therapy: Aspirin 325 mg daily 3. Pain Management: Lidoderm patch, oxycodone 5 mg every 6 hours as needed  Controlled on 3/27 4. Mood: Advised emotional support  -neuropsych consult  -pt denies depression at this time             -antipsychotic agents: N/A 5. Neuropsych: This patient is capable of making decisions on his own behalf. 6. Skin/Wound Care: Routine skin checks 7. Fluids/Electrolytes/Nutrition: encourage PO  -continue to monitor  BMP within normal limits on 3/25.  8.  Dysphagia.  on regular/thins diet and tolerating well, good appetite.  9.  HTN:  off BP meds - monitor   Controlled on 3/27 10.  Hyperlipidemia: Continue Lipitor 11.  History of tobacco/alcohol/marijuana use.  NicoDerm patch.  reviewed tobacco/drug use in regard to stroke risk 12. Spastic hemiparesis LLE  Baclofen 10mg  TID--   -tizanidine also at HS  -aggressive ROM with therapy   -PRAFO  Stable  LOS: 11 days A FACE TO FACE EVALUATION WAS PERFORMED  Derek [name] Lorie Phenix 01/26/2020, 12:41 PM

## 2020-01-27 DIAGNOSIS — R52 Pain, unspecified: Secondary | ICD-10-CM

## 2020-01-27 NOTE — Plan of Care (Signed)
  Problem: Consults Goal: RH STROKE PATIENT EDUCATION Description: See Patient Education module for education specifics  Outcome: Progressing   Problem: RH BOWEL ELIMINATION Goal: RH STG MANAGE BOWEL WITH ASSISTANCE Description: STG Manage Bowel with Assistance. Outcome: Progressing Goal: RH STG MANAGE BOWEL W/MEDICATION W/ASSISTANCE Description: STG Manage Bowel with Medication with Assistance. Outcome: Progressing   Problem: RH BLADDER ELIMINATION Goal: RH STG MANAGE BLADDER WITH ASSISTANCE Description: STG Manage Bladder With Assistance Outcome: Progressing Goal: RH STG MANAGE BLADDER WITH MEDICATION WITH ASSISTANCE Description: STG Manage Bladder With Medication With Assistance. Outcome: Progressing   Problem: RH SKIN INTEGRITY Goal: RH STG SKIN FREE OF INFECTION/BREAKDOWN Outcome: Progressing   Problem: RH SAFETY Goal: RH STG ADHERE TO SAFETY PRECAUTIONS W/ASSISTANCE/DEVICE Description: STG Adhere to Safety Precautions With Assistance/Device. Outcome: Progressing   Problem: RH PAIN MANAGEMENT Goal: RH STG PAIN MANAGED AT OR BELOW PT'S PAIN GOAL Outcome: Progressing   Problem: RH KNOWLEDGE DEFICIT Goal: RH STG INCREASE KNOWLEDGE OF DIABETES Outcome: Progressing Goal: RH STG INCREASE KNOWLEDGE OF HYPERTENSION Outcome: Progressing Goal: RH STG INCREASE KNOWLEDGE OF STROKE PROPHYLAXIS Outcome: Progressing   

## 2020-01-27 NOTE — Progress Notes (Signed)
[facility] PHYSICAL MEDICINE & REHABILITATION PROGRESS NOTE   Subjective/Complaints: Patient seen laying in bed this morning.  He states he slept well overnight.  Asks me again to set up his food in his bed for him.  He is wearing his orthoses.  ROS: Denies CP, SOB, N/V/D  Objective:   No results found. No results for input(s): WBC, HGB, HCT, PLT in the last 72 hours. No results for input(s): NA, K, CL, CO2, GLUCOSE, BUN, CREATININE, CALCIUM in the last 72 hours.  Intake/Output Summary (Last 24 hours) at 01/27/2020 1314 Last data filed at 01/27/2020 1313 Gross per 24 hour  Intake 120 ml  Output 1450 ml  Net -1330 ml     Physical Exam: Vital Signs Blood pressure 100/69, pulse 78, temperature 98.1 F (36.7 C), resp. rate 17, height 6\' 1"  (1.854 m), weight 91.4 kg, SpO2 96 %.  Constitutional: No distress . Vital signs reviewed. HENT: Normocephalic.  Atraumatic. Eyes: EOMI. No discharge. Cardiovascular: No JVD. Respiratory: Normal effort.  No stridor. GI: Non-distended. Skin: Warm and dry.  Intact. Psych: Normal mood.  Normal behavior. Musc: No edema in extremities.  No tenderness in extremities. Neuro: Alert Motor: LUE/LLE: 0/5 proximal distal, unchanged Synergistic movements noted   Assessment/Plan: 1. Functional deficits secondary to right MCA infarct which require 3+ hours per day of interdisciplinary therapy in a comprehensive inpatient rehab setting.  Physiatrist is providing close team supervision and 24 hour management of active medical problems listed below.  Physiatrist and rehab team continue to assess barriers to discharge/monitor patient progress toward functional and medical goals  Care Tool:  Bathing    Body parts bathed by patient: Right arm, Right lower leg, Left arm, Left lower leg, Chest, Face, Abdomen, Front perineal area, Buttocks, Right upper leg, Left upper leg   Body parts bathed by helper: Buttocks, Right arm     Bathing assist Assist Level:  Moderate Assistance - Patient 50 - 74%     Upper Body Dressing/Undressing Upper body dressing   What is the patient wearing?: Pull over shirt    Upper body assist Assist Level: Moderate Assistance - Patient 50 - 74%    Lower Body Dressing/Undressing Lower body dressing      What is the patient wearing?: Pants, Underwear/pull up     Lower body assist Assist for lower body dressing: Moderate Assistance - Patient 50 - 74%     Toileting Toileting    Toileting assist Assist for toileting: Maximal Assistance - Patient 25 - 49% Assistive Device Comment: BSC over toilet in bathroom   Transfers Chair/bed transfer  Transfers assist     Chair/bed transfer assist level: Moderate Assistance - Patient 50 - 74%     Locomotion Ambulation   Ambulation assist   Ambulation activity did not occur: Safety/medical concerns  Assist level: Maximal Assistance - Patient 25 - 49% Assistive device: Other (comment)(rail in hallway) Max distance: 15'   Walk 10 feet activity   Assist  Walk 10 feet activity did not occur: Safety/medical concerns  Assist level: Maximal Assistance - Patient 25 - 49% Assistive device: Other (comment)(rail in hallway)   Walk 50 feet activity   Assist Walk 50 feet with 2 turns activity did not occur: Safety/medical concerns         Walk 150 feet activity   Assist Walk 150 feet activity did not occur: Safety/medical concerns         Walk 10 feet on uneven surface  activity   Assist Walk  10 feet on uneven surfaces activity did not occur: Safety/medical concerns         Wheelchair     Assist Will patient use wheelchair at discharge?: Yes Type of Wheelchair: Manual    Wheelchair assist level: Supervision/Verbal cueing Max wheelchair distance: 150'    Wheelchair 50 feet with 2 turns activity    Assist        Assist Level: Supervision/Verbal cueing   Wheelchair 150 feet activity     Assist      Assist Level:  Supervision/Verbal cueing   Blood pressure 100/69, pulse 78, temperature 98.1 F (36.7 C), resp. rate 17, height 6\' 1"  (1.854 m), weight 91.4 kg, SpO2 96 %.  Medical Problem List and Plan: 1.  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  Status post negative TEE 01/10/2020  Continue CIR  -Continue PRAFO/WHO at HS 2.  Antithrombotics: -DVT/anticoagulation: Lovenox initiated 01/10/2020  -recent dopplers negative             -antiplatelet therapy: Aspirin 325 mg daily 3. Pain Management: Lidoderm patch, oxycodone 5 mg every 6 hours as needed  Controlled on 3/28 4. Mood: Advised emotional support  -neuropsych consult  -pt denies depression at this time             -antipsychotic agents: N/A 5. Neuropsych: This patient is capable of making decisions on his own behalf. 6. Skin/Wound Care: Routine skin checks 7. Fluids/Electrolytes/Nutrition: encourage PO  -continue to monitor  BMP within normal limits on 3/25.  8.  Dysphagia.  on regular/thins diet   Eating 75-100% of meals.. 9.  HTN:  off BP meds - monitor   Relatively controlled/soft on 3/28 10.  Hyperlipidemia: Continue Lipitor 11.  History of tobacco/alcohol/marijuana use.  NicoDerm patch.  reviewed tobacco/drug use in regard to stroke risk 12. Spastic hemiparesis LLE  Baclofen 10mg  TID--   -tizanidine also at HS  -aggressive ROM with therapy   -PRAFO  Stable  LOS: 12 days A FACE TO FACE EVALUATION WAS PERFORMED  Khai Torbert 4/28 01/27/2020, 1:14 PM

## 2020-01-28 ENCOUNTER — Inpatient Hospital Stay (HOSPITAL_COMMUNITY): Payer: BC Managed Care – PPO | Admitting: Speech Pathology

## 2020-01-28 ENCOUNTER — Inpatient Hospital Stay (HOSPITAL_COMMUNITY): Payer: BC Managed Care – PPO | Admitting: Physical Therapy

## 2020-01-28 ENCOUNTER — Inpatient Hospital Stay (HOSPITAL_COMMUNITY): Payer: BC Managed Care – PPO | Admitting: Occupational Therapy

## 2020-01-28 NOTE — Progress Notes (Signed)
Physical Therapy Session Note  Patient Details  Name: Derek [name] MRN: [id number] Date of Birth: 1976-01-10  Today's Date: 01/28/2020 PT Individual Time: 1100-1140 PT Individual Time Calculation (min): 40 min   Short Term Goals: Week 2:  PT Short Term Goal 1 (Week 2): Pt will initiate gait training PT Short Term Goal 2 (Week 2): Pt will maintain static standing balance w/ min assist consistently PT Short Term Goal 3 (Week 2): Pt will maintain dynamic sitting balance w/ supervision  Skilled Therapeutic Interventions/Progress Updates:   Pt received in w/c and agreeable to therapy, no c/o pain. Total assist w/c transport to therapy gym. Worked on standing/WB through LLE at rail while performing pre-gait tasks including lateral weight shifting, R forward and backwards stepping, and partial knee bends. Emphasis on L quad activation w/ trace activation palpated ~25% of the time, verbal/tactile cues for L quad. Needs max assist overall to block L knee to prevent buckling. Added L DF ACE wrap to assist w/ foot clearance and kicked beach ball w/ LLE. Verbal and tactile cues to initiate w/ strong hip flexor activation palpated. Ambulated at rail (30') w/ some carryover of hip flexion for swing limb advancement, otherwise needing max-total assist for LLE management. Min-mod assist needed for support at waist for upright balance and lateral weight shifting. Pt self-propelled w/c back to room via R hemi technique. Set-up w/ NMES to L quad, unattended x30 min (see parameters below, pt tolerated well w/ skin intact afterwards). Instructed pt to visualize L quad contraction to facilitate increased neuroplastic changes. Pt ended session in w/c, all needs in reach.   Saebo Stim One 330 pulse width 35 Hz pulse rate On 8 sec/ off 8 sec Ramp up/ down 2 sec Symmetrical Biphasic wave form  Max intensity at 500 Ohm load  Therapy Documentation Precautions:  Precautions Precautions: Fall Precaution  Comments: L neglect, hemiparesis Required Braces or Orthoses: Other Brace Other Brace: L knee immobilizer Restrictions Weight Bearing Restrictions: No Vital Signs:    Therapy/Group: Individual Therapy  Romar Woodrick [name] 01/28/2020, 12:15 PM

## 2020-01-28 NOTE — Progress Notes (Signed)
Occupational Therapy Session Note  Patient Details  Name: Derek [name] MRN: [id number] Date of Birth: 10-14-1976  Today's Date: 01/28/2020 OT Individual Time: 3735-7897 OT Individual Time Calculation (min): 75 min    Short Term Goals: Week 2:  OT Short Term Goal 1 (Week 2): Patient will don UB clothing with Min A and 1-3 cues for sequencing. OT Short Term Goal 2 (Week 2): Patient will complete supine <> EOB with Min A in prep for completion of ADLs. OT Short Term Goal 3 (Week 2): Pt will use RUE as stabilizer during grooming with Min multimodal cueing.  Skilled Therapeutic Interventions/Progress Updates:  Patient met lying supine in bed in agreement with OT treatment session. 0/10 pain at rest. Skilled OT with focus on sef-care re-education and NMR as detailed below. Supine to EOB transfer with patient able to recall hook technique to advance LLE from bed level to EOB. Patient able to push trunk from lying to sitting with Min A. Toilet transfer with use of Stedy and supervision A. Patient with increased static standing balance this date requiring Max A for hygiene and Mod A for clothing management. Patient's functional transfers continue to improve with progression to transferring from wc to commode/shower in subsequent treatment sessions. Patient engaged in UB/LB bathing seated on BSC in shower with Mod cueing for L attention LLE>LUE. UB dressing seated EOB with Min A and Mod cues for hemi-dressing techniques. Patient able to thread BLE through underwear/pants in sitting with assistance to maintain figure-4 position with LLE crossed over RLE. Sit to stand from elevated EOB with Mod A with patient able to hike pants over hips with Mod A L knee block. Squat-pivot transfer to wc on R with Min A. Session concluded with patient seated in wc with call bell within reach, belt alarm activated, and all needs met.   Therapy Documentation Precautions:  Precautions Precautions: Fall Precaution Comments:  L neglect, hemiparesis Required Braces or Orthoses: Other Brace Other Brace: L knee immobilizer Restrictions Weight Bearing Restrictions: No   Therapy/Group: Individual Therapy  Jeanene Mena [name] 01/28/2020, 7:50 AM

## 2020-01-28 NOTE — Progress Notes (Signed)
Speech Language Pathology Daily Session Note  Patient Details  Name: Derek [name] MRN: [id number] Date of Birth: 1976-01-01  Today's Date: 01/28/2020 SLP Individual Time: 2197-5883 SLP Individual Time Calculation (min): 55 min  Short Term Goals: Week 2: SLP Short Term Goal 1 (Week 2): Patient will demonstrate sustained attention to functional tasks for 45 minutes with Min verbal cues for redirection. SLP Short Term Goal 2 (Week 2): Patient will recall new, daily information with Min verbal cues. SLP Short Term Goal 3 (Week 2): Patient will self-monitor and correct errors during functional tasks with Min verbal cues. SLP Short Term Goal 4 (Week 2): Patient will demonstrate functional problem solving for mildly complex tasks with Min verbal cues.  Skilled Therapeutic Interventions: Skilled treatment session focused on cognitive goals. SLP facilitated session by providing overall Max A verbal and visual cues for complex problem solving during map/navigation task. SLP also facilitated session by administering the Diller-[place]in[place] Visual Cancellation Test-Single Stimuli. Patient missed 19 occurences, 11 of which were within his left visual field. Patient able to utilize visual scanning strategies during a basic puzzle task with Min verbal cues. Patient left upright in wheelchair with alarm on and all needs within reach. Continue with current plan of care.      Pain No/Denies Pain   Therapy/Group: Individual Therapy  Walaa Carel 01/28/2020, 3:02 PM

## 2020-01-28 NOTE — Progress Notes (Signed)
Physical Therapy Session Note  Patient Details  Name: Derek [name] MRN: [id number] Date of Birth: Apr 08, 1976  Today's Date: 01/28/2020 PT Individual Time: 1335-1400 PT Individual Time Calculation (min): 25 min   Short Term Goals: Week 2:  PT Short Term Goal 1 (Week 2): Pt will initiate gait training PT Short Term Goal 2 (Week 2): Pt will maintain static standing balance w/ min assist consistently PT Short Term Goal 3 (Week 2): Pt will maintain dynamic sitting balance w/ supervision  Skilled Therapeutic Interventions/Progress Updates: Pt presents sitting in w/c , agreeable to therapy.  Pt left LE placed on rest and pt negotiated w/c out of room and through hallway to gym.  Pt required min A for sit to stand and step-pivot to Nu-step w/ mod A and blocking of left knee to allow for weight-bearing.  Pt performed Nu-step at level 1 x 4' w/ bilateral LEs and right UE, 3' w/ LEs only.  Min assist and verbal cues to maintain neutral left hip rotation during session.  Pt required min A for sit to stand and step-pivot Nu-step > w/c w/ blocking of left knee.  Pt negotiated hallway approx. 24' using right ext.  Chair alarm on and needs in reach, left w/ ST.     Therapy Documentation Precautions:  Precautions Precautions: Fall Precaution Comments: L neglect, hemiparesis Required Braces or Orthoses: Other Brace Other Brace: L knee immobilizer Restrictions Weight Bearing Restrictions: No General:   Vital Signs: Therapy Vitals Temp: 98 F (36.7 C) Pulse Rate: 78 Resp: 18 BP: 104/68 Patient Position (if appropriate): Sitting Oxygen Therapy SpO2: 98 % O2 Device: Room Air Pain:  no c/o pain     Therapy/Group: Individual Therapy  Lucio Edward 01/28/2020, 3:34 PM

## 2020-01-28 NOTE — Plan of Care (Signed)
  Problem: Consults Goal: RH STROKE PATIENT EDUCATION Description: See Patient Education module for education specifics  Outcome: Progressing   Problem: RH BOWEL ELIMINATION Goal: RH STG MANAGE BOWEL WITH ASSISTANCE Description: STG Manage Bowel with Assistance. Outcome: Progressing Goal: RH STG MANAGE BOWEL W/MEDICATION W/ASSISTANCE Description: STG Manage Bowel with Medication with Assistance. Outcome: Progressing   Problem: RH BLADDER ELIMINATION Goal: RH STG MANAGE BLADDER WITH ASSISTANCE Description: STG Manage Bladder With Assistance Outcome: Progressing Goal: RH STG MANAGE BLADDER WITH MEDICATION WITH ASSISTANCE Description: STG Manage Bladder With Medication With Assistance. Outcome: Progressing   Problem: RH SKIN INTEGRITY Goal: RH STG SKIN FREE OF INFECTION/BREAKDOWN Outcome: Progressing   Problem: RH SAFETY Goal: RH STG ADHERE TO SAFETY PRECAUTIONS W/ASSISTANCE/DEVICE Description: STG Adhere to Safety Precautions With Assistance/Device. Outcome: Progressing   Problem: RH PAIN MANAGEMENT Goal: RH STG PAIN MANAGED AT OR BELOW PT'S PAIN GOAL Outcome: Progressing   Problem: RH KNOWLEDGE DEFICIT Goal: RH STG INCREASE KNOWLEDGE OF DIABETES Outcome: Progressing Goal: RH STG INCREASE KNOWLEDGE OF HYPERTENSION Outcome: Progressing Goal: RH STG INCREASE KNOWLEDGE OF STROKE PROPHYLAXIS Outcome: Progressing   

## 2020-01-28 NOTE — Progress Notes (Signed)
[facility] PHYSICAL MEDICINE & REHABILITATION PROGRESS NOTE   Subjective/Complaints: Up in bed. No new complaints. Working on stretching his left knee. Sleeping ok  ROS: Patient denies fever, rash, sore throat, blurred vision, nausea, vomiting, diarrhea, cough, shortness of breath or chest pain, joint or back pain, headache, or mood change.   Objective:   No results found. No results for input(s): WBC, HGB, HCT, PLT in the last 72 hours. No results for input(s): NA, K, CL, CO2, GLUCOSE, BUN, CREATININE, CALCIUM in the last 72 hours.  Intake/Output Summary (Last 24 hours) at 01/28/2020 1102 Last data filed at 01/28/2020 0900 Gross per 24 hour  Intake 360 ml  Output 800 ml  Net -440 ml     Physical Exam: Vital Signs Blood pressure 129/87, pulse 82, temperature 98 F (36.7 C), resp. rate 18, height 6\' 1"  (1.854 m), weight 91.4 kg, SpO2 99 %.  Constitutional: No distress . Vital signs reviewed. HEENT: EOMI, oral membranes moist Neck: supple Cardiovascular: RRR without murmur. No JVD    Respiratory/Chest: CTA Bilaterally without wheezes or rales. Normal effort    GI/Abdomen: BS +, non-tender, non-distended Ext: no clubbing, cyanosis, or edema Psych: flat. Musc: No edema in extremities.  No tenderness in extremities. Neuro: Alert Motor: LUE/LLE: 0/5 proximal distal,  2/4 MAS left hamstrings, gastroc RUE and RLE 5/5.   Assessment/Plan: 1. Functional deficits secondary to right MCA infarct which require 3+ hours per day of interdisciplinary therapy in a comprehensive inpatient rehab setting.  Physiatrist is providing close team supervision and 24 hour management of active medical problems listed below.  Physiatrist and rehab team continue to assess barriers to discharge/monitor patient progress toward functional and medical goals  Care Tool:  Bathing    Body parts bathed by patient: Right arm, Right lower leg, Left arm, Left lower leg, Chest, Face, Abdomen, Front perineal  area, Buttocks, Right upper leg, Left upper leg   Body parts bathed by helper: Buttocks, Right arm     Bathing assist Assist Level: Moderate Assistance - Patient 50 - 74%     Upper Body Dressing/Undressing Upper body dressing   What is the patient wearing?: Pull over shirt    Upper body assist Assist Level: Moderate Assistance - Patient 50 - 74%    Lower Body Dressing/Undressing Lower body dressing      What is the patient wearing?: Pants, Underwear/pull up     Lower body assist Assist for lower body dressing: Moderate Assistance - Patient 50 - 74%     Toileting Toileting    Toileting assist Assist for toileting: Maximal Assistance - Patient 25 - 49% Assistive Device Comment: BSC over toilet in bathroom   Transfers Chair/bed transfer  Transfers assist     Chair/bed transfer assist level: Moderate Assistance - Patient 50 - 74%     Locomotion Ambulation   Ambulation assist   Ambulation activity did not occur: Safety/medical concerns  Assist level: Maximal Assistance - Patient 25 - 49% Assistive device: Other (comment)(rail in hallway) Max distance: 15'   Walk 10 feet activity   Assist  Walk 10 feet activity did not occur: Safety/medical concerns  Assist level: Maximal Assistance - Patient 25 - 49% Assistive device: Other (comment)(rail in hallway)   Walk 50 feet activity   Assist Walk 50 feet with 2 turns activity did not occur: Safety/medical concerns         Walk 150 feet activity   Assist Walk 150 feet activity did not occur: Safety/medical concerns  Walk 10 feet on uneven surface  activity   Assist Walk 10 feet on uneven surfaces activity did not occur: Safety/medical concerns         Wheelchair     Assist Will patient use wheelchair at discharge?: Yes Type of Wheelchair: Manual    Wheelchair assist level: Supervision/Verbal cueing Max wheelchair distance: 150'    Wheelchair 50 feet with 2 turns  activity    Assist        Assist Level: Supervision/Verbal cueing   Wheelchair 150 feet activity     Assist      Assist Level: Supervision/Verbal cueing   Blood pressure 129/87, pulse 82, temperature 98 F (36.7 C), resp. rate 18, height 6\' 1"  (1.854 m), weight 91.4 kg, SpO2 99 %.  Medical Problem List and Plan: 1.  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  Status post negative TEE 01/10/2020  Continue CIR  -Continue PRAFO/WHO at HS   -reviewed RLE stretches 2.  Antithrombotics: -DVT/anticoagulation: Lovenox initiated 01/10/2020  -recent dopplers negative             -antiplatelet therapy: Aspirin 325 mg daily 3. Pain Management: Lidoderm patch, oxycodone 5 mg every 6 hours as needed  Controlled on 3/29 4. Mood: Advised emotional support  -neuropsych consult  -pt denies depression at this time             -antipsychotic agents: N/A 5. Neuropsych: This patient is capable of making decisions on his own behalf. 6. Skin/Wound Care: Routine skin checks 7. Fluids/Electrolytes/Nutrition: encourage PO  -continue to monitor  BMP within normal limits on 3/25.  8.  Dysphagia.  on regular/thins diet   Eating 75-100% of meals.. 9.  HTN:  off BP meds - monitor   Relatively controlled 3/29 10.  Hyperlipidemia: Continue Lipitor 11.  History of tobacco/alcohol/marijuana use.  NicoDerm patch.  reviewed tobacco/drug use in regard to stroke risk 12. Spastic hemiparesis LLE  Baclofen 10mg  TID--   -tizanidine also at HS  -aggressive ROM with therapy   -PRAFO, KI when up  LOS: 13 days A FACE TO FACE EVALUATION WAS PERFORMED  4/29 01/28/2020, 11:02 AM

## 2020-01-29 ENCOUNTER — Inpatient Hospital Stay (HOSPITAL_COMMUNITY): Payer: BC Managed Care – PPO | Admitting: Occupational Therapy

## 2020-01-29 ENCOUNTER — Inpatient Hospital Stay (HOSPITAL_COMMUNITY): Payer: BC Managed Care – PPO | Admitting: Physical Therapy

## 2020-01-29 ENCOUNTER — Inpatient Hospital Stay (HOSPITAL_COMMUNITY): Payer: BC Managed Care – PPO | Admitting: Speech Pathology

## 2020-01-29 NOTE — Progress Notes (Signed)
Occupational Therapy Session Note  Patient Details  Name: Derek [name] MRN: [id number] Date of Birth: [birth year]  Today's Date: 01/29/2020 OT Individual Time: 1033-1100 OT Individual Time Calculation (min): 27 min    Short Term Goals: Week 3:  OT Short Term Goal 1 (Week 3): Pt will use LUE as stabilizer during grooming with Min multimodal cueing. OT Short Term Goal 2 (Week 3): Patient will don LB clothing with Mod A at sit to stand level. OT Short Term Goal 3 (Week 3): Pt will attend to left environment during grooming tasks with 2-3 cues. OT Short Term Goal 4 (Week 3): Patient will complete toilet transfer to [facility] with Mod A.  Skilled Therapeutic Interventions/Progress Updates:    Upon entering the room, pt seated in wheelchair with caregiver present in room. Pt with no c/o pain and agreeable to OT intervention. Pt requesting to wash UB and change shirt. Pt propelled wheelchair to sink with supervision and hemiplegic technique. Pt doffing shirt by pulling over head first and needing min cuing to attend to L UE and pull shirt fully off. Pt washing UB with hand over hand assistance to utilize L UE in task. Pt cued to place L UE between legs in order to thread L shirt sleeve on with less difficulty. Pt needing min cuing for technique and assistance to pull down over L shoulder. OT recommended trying L UE> head> then R UE to fix this next time. Pt remaining in wheelchair and chair alarm belt donned with L UE trough placed. OT providing CVA education as well in regards to therapeutic intervention and continued to encourage pt. All needs within reach.   Therapy Documentation Precautions:  Precautions Precautions: Fall Precaution Comments: L neglect, hemiparesis Required Braces or Orthoses: Other Brace Other Brace: L knee immobilizer Restrictions Weight Bearing Restrictions: No General:   Vital Signs:   Pain: Pain Assessment Pain Scale: 0-10 Pain Score: 0-No pain ADL: ADL Grooming:  Minimal assistance Where Assessed-Grooming: Sitting at sink Upper Body Bathing: Moderate assistance Where Assessed-Upper Body Bathing: Edge of bed Lower Body Bathing: Maximal assistance Where Assessed-Lower Body Bathing: Edge of bed Upper Body Dressing: Moderate assistance Where Assessed-Upper Body Dressing: Edge of bed Lower Body Dressing: Maximal assistance Where Assessed-Lower Body Dressing: Edge of bed Toileting: Maximal assistance Where Assessed-Toileting: Teacher, adult education: Maximal Dentist Method: Surveyor, minerals: Bedside commode   Therapy/Group: Individual Therapy  Alen Bleacher 01/29/2020, 11:30 AM

## 2020-01-29 NOTE — Progress Notes (Signed)
[place] PHYSICAL MEDICINE & REHABILITATION PROGRESS NOTE   Subjective/Complaints: No new complaints. Noticed some tingling in his right foot last night. Sleeping better.   ROS: Patient denies fever, rash, sore throat, blurred vision, nausea, vomiting, diarrhea, cough, shortness of breath or chest pain, joint or back pain, headache, or mood change.   Objective:   No results found. No results for input(s): WBC, HGB, HCT, PLT in the last 72 hours. No results for input(s): NA, K, CL, CO2, GLUCOSE, BUN, CREATININE, CALCIUM in the last 72 hours.  Intake/Output Summary (Last 24 hours) at 01/29/2020 0947 Last data filed at 01/29/2020 0730 Gross per 24 hour  Intake 736 ml  Output 1350 ml  Net -614 ml     Physical Exam: Vital Signs Blood pressure 131/75, pulse 77, temperature 97.8 F (36.6 C), temperature source Oral, resp. rate 16, height 6\' 1"  (1.854 m), weight 91.4 kg, SpO2 98 %.  Constitutional: No distress . Vital signs reviewed. HEENT: EOMI, oral membranes moist Neck: supple Cardiovascular: RRR without murmur. No JVD    Respiratory/Chest: CTA Bilaterally without wheezes or rales. Normal effort    GI/Abdomen: BS +, non-tender, non-distended Ext: no clubbing, cyanosis, or edema Psych: pleasant seems more up beat today Musc: No edema in extremities.  No tenderness in extremities. Neuro: Alert Motor: LUE/LLE: 0/5 proximal distal--see no movement today,  2/4 MAS left hamstrings, gastroc, 1/4 LUE RUE and RLE 5/5.   Assessment/Plan: 1. Functional deficits secondary to right MCA infarct which require 3+ hours per day of interdisciplinary therapy in a comprehensive inpatient rehab setting.  Physiatrist is providing close team supervision and 24 hour management of active medical problems listed below.  Physiatrist and rehab team continue to assess barriers to discharge/monitor patient progress toward functional and medical goals  Care Tool:  Bathing    Body parts bathed by  patient: Right arm, Right lower leg, Left arm, Left lower leg, Chest, Face, Abdomen, Front perineal area, Buttocks, Right upper leg, Left upper leg   Body parts bathed by helper: Buttocks, Right arm     Bathing assist Assist Level: Moderate Assistance - Patient 50 - 74%     Upper Body Dressing/Undressing Upper body dressing   What is the patient wearing?: Pull over shirt    Upper body assist Assist Level: Moderate Assistance - Patient 50 - 74%    Lower Body Dressing/Undressing Lower body dressing      What is the patient wearing?: Pants, Underwear/pull up     Lower body assist Assist for lower body dressing: Moderate Assistance - Patient 50 - 74%     Toileting Toileting    Toileting assist Assist for toileting: Maximal Assistance - Patient 25 - 49% Assistive Device Comment: BSC over toilet in bathroom   Transfers Chair/bed transfer  Transfers assist     Chair/bed transfer assist level: Moderate Assistance - Patient 50 - 74%     Locomotion Ambulation   Ambulation assist   Ambulation activity did not occur: Safety/medical concerns  Assist level: Maximal Assistance - Patient 25 - 49% Assistive device: Other (comment)(rail in hallway) Max distance: 30'   Walk 10 feet activity   Assist  Walk 10 feet activity did not occur: Safety/medical concerns  Assist level: Maximal Assistance - Patient 25 - 49% Assistive device: Other (comment)(rail in hallway)   Walk 50 feet activity   Assist Walk 50 feet with 2 turns activity did not occur: Safety/medical concerns         Walk 150 feet  activity   Assist Walk 150 feet activity did not occur: Safety/medical concerns         Walk 10 feet on uneven surface  activity   Assist Walk 10 feet on uneven surfaces activity did not occur: Safety/medical concerns         Wheelchair     Assist Will patient use wheelchair at discharge?: Yes Type of Wheelchair: Manual    Wheelchair assist level:  Supervision/Verbal cueing Max wheelchair distance: 150'    Wheelchair 50 feet with 2 turns activity    Assist        Assist Level: Supervision/Verbal cueing   Wheelchair 150 feet activity     Assist      Assist Level: Supervision/Verbal cueing   Blood pressure 131/75, pulse 77, temperature 97.8 F (36.6 C), temperature source Oral, resp. rate 16, height 6\' 1"  (1.854 m), weight 91.4 kg, SpO2 98 %.  Medical Problem List and Plan: 1.  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  Status post negative TEE 01/10/2020  Continue CIR  -Continue PRAFO/WHO at HS    -KI for hamstring tone as needed  -Interdisciplinary Team Conference today   2.  Antithrombotics: -DVT/anticoagulation: Lovenox initiated 01/10/2020  -recent dopplers negative             -antiplatelet therapy: Aspirin 325 mg daily 3. Pain Management: Lidoderm patch, oxycodone 5 mg every 6 hours as needed  Controlled on 3/30 4. Mood: Advised emotional support  -neuropsych consult  -pt denies depression at this time             -antipsychotic agents: N/A 5. Neuropsych: This patient is capable of making decisions on his own behalf. 6. Skin/Wound Care: Routine skin checks 7. Fluids/Electrolytes/Nutrition: encourage PO  -continue to monitor  BMP within normal limits on 3/25--recheck Monday.  8.  Dysphagia.  on regular/thins diet   Eating 75-100% of meals.. 9.  HTN:  off BP meds - monitor   Relatively controlled 3/29 10.  Hyperlipidemia: Continue Lipitor 11.  History of tobacco/alcohol/marijuana use.  NicoDerm patch.  reviewed tobacco/drug use in regard to stroke risk 12. Spastic hemiparesis LLE  Baclofen 10mg  TID--   -tizanidine also at HS  -aggressive ROM with therapy   -PRAFO, KI when up  LOS: 14 days A FACE TO [place] 01/29/2020, 9:47 AM

## 2020-01-29 NOTE — Plan of Care (Signed)
  Problem: Consults Goal: RH STROKE PATIENT EDUCATION Description: See Patient Education module for education specifics  Outcome: Progressing   Problem: RH BOWEL ELIMINATION Goal: RH STG MANAGE BOWEL WITH ASSISTANCE Description: STG Manage Bowel with Assistance. Outcome: Progressing Goal: RH STG MANAGE BOWEL W/MEDICATION W/ASSISTANCE Description: STG Manage Bowel with Medication with Assistance. Outcome: Progressing   Problem: RH BLADDER ELIMINATION Goal: RH STG MANAGE BLADDER WITH ASSISTANCE Description: STG Manage Bladder With Assistance Outcome: Progressing Goal: RH STG MANAGE BLADDER WITH MEDICATION WITH ASSISTANCE Description: STG Manage Bladder With Medication With Assistance. Outcome: Progressing   Problem: RH SKIN INTEGRITY Goal: RH STG SKIN FREE OF INFECTION/BREAKDOWN Outcome: Progressing   Problem: RH SAFETY Goal: RH STG ADHERE TO SAFETY PRECAUTIONS W/ASSISTANCE/DEVICE Description: STG Adhere to Safety Precautions With Assistance/Device. Outcome: Progressing   Problem: RH PAIN MANAGEMENT Goal: RH STG PAIN MANAGED AT OR BELOW PT'S PAIN GOAL Outcome: Progressing   Problem: RH KNOWLEDGE DEFICIT Goal: RH STG INCREASE KNOWLEDGE OF DIABETES Outcome: Progressing Goal: RH STG INCREASE KNOWLEDGE OF HYPERTENSION Outcome: Progressing Goal: RH STG INCREASE KNOWLEDGE OF STROKE PROPHYLAXIS Outcome: Progressing   

## 2020-01-29 NOTE — Progress Notes (Signed)
Speech Language Pathology Daily Session Note  Patient Details  Name: Derek [name] MRN: [id number] Date of Birth: Nov 22, 1975  Today's Date: 01/29/2020 SLP Individual Time: 1335-1415 SLP Individual Time Calculation (min): 40 min  Short Term Goals: Week 2: SLP Short Term Goal 1 (Week 2): Patient will demonstrate sustained attention to functional tasks for 45 minutes with Min verbal cues for redirection. SLP Short Term Goal 2 (Week 2): Patient will recall new, daily information with Min verbal cues. SLP Short Term Goal 3 (Week 2): Patient will self-monitor and correct errors during functional tasks with Min verbal cues. SLP Short Term Goal 4 (Week 2): Patient will demonstrate functional problem solving for mildly complex tasks with Min verbal cues.  Skilled Therapeutic Interventions: Skilled treatment session focused on cognitive goals. SLP facilitated session by providing supervision level verbal cues for recall of procedures to a previously learned task. However, patient was overall Mod I for complex problem solving and selective attention for ~30 minutes. Patient left upright in wheelchair with alarm on and all needs within reach. Continue with current plan of care.     Pain No/Denies Pain   Therapy/Group: Individual Therapy  Iva Montelongo 01/29/2020, 3:12 PM

## 2020-01-29 NOTE — Progress Notes (Signed)
Occupational Therapy Weekly Progress Note  Patient Details  Name: Derek [name] MRN: [id number] Date of Birth: 02/28/1976  Beginning of progress report period: January 16, 2020 End of progress report period: January 29, 2020  Today's Date: 01/29/2020 OT Individual Time: 0916-1000 OT Individual Time Calculation (min): 44 min    Patient has met 2 of 3 short term goals. Patient demonstrates improved bed mobility requiring Min A for supine to EOB transfers. Patient able to don UB clothing with Min A and Min cues for hemi-dressing techniques. LB clothing with Mod A for threading BLE and for sit to stand, but requires Max A to maintain standing balance to hike pants over hips with L knee block. Patient demonstrates increased hip flexibility, requiring less assistance to maintain figure-4 position when threading underwear/pants and donning footwear.       Patient continues to demonstrate the following deficits: muscle weakness,decreased cardiorespiratoy endurance,impaired timing and sequencing, abnormal tone, unbalanced muscle activation, ataxia, decreased coordination and decreased motor planning,decreased visual motor skillsand decreased initiation, decreased attention, decreased awareness, decreased problem solving, decreased safety awareness, decreased memory and delayed processing and therefore will continue to benefit from skilled OT intervention to enhance overall performance with BADL and Reduce care partner burden.  Patient progressing toward long term goals..  Continue plan of care.  OT Short Term Goals Week 2:  OT Short Term Goal 1 (Week 2): Patient will don UB clothing with Min A and 1-3 cues for sequencing. OT Short Term Goal 1 - Progress (Week 2): Met OT Short Term Goal 2 (Week 2): Patient will complete supine <> EOB with Min A in prep for completion of ADLs. OT Short Term Goal 2 - Progress (Week 2): Met OT Short Term Goal 3 (Week 2): Pt will use LUE as stabilizer during grooming with  Min multimodal cueing. OT Short Term Goal 3 - Progress (Week 2): Progressing toward goal Week 3:  OT Short Term Goal 1 (Week 3): Pt will use LUE as stabilizer during grooming with Min multimodal cueing. OT Short Term Goal 2 (Week 3): Patient will don LB clothing with Mod A at sit to stand level. OT Short Term Goal 3 (Week 3): Pt will attend to left environment during grooming tasks with 2-3 cues. OT Short Term Goal 4 (Week 3): Patient will complete toilet transfer to [facility] with Mod A.  Skilled Therapeutic Interventions/Progress Updates:  Patient met lying supine in bed in agreement with OT treatment session. Supine to EOB with Min A for trunk elevation with patient able to advance LLE from bed surface to EOB with use of hook technique with RLE. Toileting in standing with use of Stedy and LUE in weight bearing position on bar. Patient engaged in oral hygiene seated at sink level with use of LUE on sink surface as a stabilizer requiring hand over hand assist. Shaving seated at sink level with Mod multimodal cues for shaving L side of face and hand over hand assist to engage LUE in activity. Session concluded with patient seated in wc with belt alarm activated, call bell within reach and all needs met.   Therapy Documentation Precautions:  Precautions Precautions: Fall Precaution Comments: L neglect, hemiparesis Required Braces or Orthoses: Other Brace Other Brace: L knee immobilizer Restrictions Weight Bearing Restrictions: No  Therapy/Group: Individual Therapy  Derek [name] 01/29/2020, 7:40 AM

## 2020-01-29 NOTE — Progress Notes (Signed)
Social Work Patient ID: Derek [name], male   DOB: 03-21-1976, 44 y.o.   MRN: [id number]    SW met with pt in room to provide updates from team conference, and informed pt d/c date remains the same. SW asked pt if there were any updates on ramp status. Pt stated his friend is expected to assess if he can install the ramp. SW discussed with pt his thoughts/feelings about his current condition as he was perseverating on completing unemployment despite being aware that [name] will help. Pt just became silent and did not respond. SW discussed with pt meeting with neuropsych again. Pt agreeable.   *SW called pt d/c Derek [name] to provide updates from team conference, and get updates on if able to get a ramp. Reports that she is waiting for the patient's friend to assess the home for a ramp. She is concerned about expenses. SW informed there were no other resources to offer than what has been provided.  SW will continue to follow and assess pt for d/c needs.   Loralee Pacas, MSW, [place] Office: [phone] Cell: [phone] Fax: [phone]

## 2020-01-29 NOTE — Progress Notes (Signed)
Physical Therapy Session Note  Patient Details  Name: Derek [name] MRN: [id number] Date of Birth: 1976-05-12  Today's Date: 01/29/2020 PT Individual Time: 1425-1524 PT Individual Time Calculation (min): 59 min   Short Term Goals: Week 2:  PT Short Term Goal 1 (Week 2): Pt will initiate gait training PT Short Term Goal 2 (Week 2): Pt will maintain static standing balance w/ min assist consistently PT Short Term Goal 3 (Week 2): Pt will maintain dynamic sitting balance w/ supervision  Skilled Therapeutic Interventions/Progress Updates:   Pt on phone upon arrival, requesting therapist return in a few minutes as he was on phone with his apartment complex. Missed 16 min of skilled PT. Pt received in w/c and agreeable to therapy, no c/o pain. Total assist w/c transport to/from therapy gym. Performed gait training in Litegait over treadmill this session. Sit<>stands w/ min assist to don/doff sling. Ambulated at 0.8 mph, 2 min x5 reps in total. Therapist providing total assist for LLE management including step placement, knee extension in mid/terminal stance, and for knee flexion during swing phase. Pt also wearing GRAFO on L side for DF control and fabric sock over shoe for decreased friction w/ L foot clearance. 2nd therapist providing manual assist for lateral weight shifting at pelvis and for neutral pelvic rotation. Performed pregait tasks in standing in between gait bouts including R forward/backward stepping to facilitate increased L quad activation in stance/WB. Palpable L quad activation w/ all activities in litegait including during gait and pre-gait. Returned to room via w/c. Ended session in w/c and all needs in reach.   Therapy Documentation Precautions:  Precautions Precautions: Fall Precaution Comments: L neglect, hemiparesis Required Braces or Orthoses: Other Brace Other Brace: L knee immobilizer Restrictions Weight Bearing Restrictions: No Vital Signs:    Therapy/Group:  Individual Therapy  Dawnell Bryant [name] 01/29/2020, 3:25 PM

## 2020-01-29 NOTE — Patient Care Conference (Signed)
Inpatient RehabilitationTeam Conference and Plan of Care Update Date: 01/29/2020   Time: 10:05 AM    Patient Name: Derek [name]      Medical Record Number: [id number]  Date of Birth: September 14, 1976 Sex: Male         Room/Bed: 4W15C/4W15C-01 Payor Info: Payor: BLUE [place] / Plan: BCBS COMM PPO / Product Type: *No Product type* /    Admit Date/Time:  01/15/2020  6:30 PM  Primary Diagnosis:  Right middle cerebral artery stroke [facility])  Patient Active Problem List   Diagnosis Date Noted  . Pain   . Spastic hemiparesis ([place])   . History of hypertension   . Dyslipidemia   . Right middle cerebral artery stroke ([place]) 01/15/2020  . Leukocytosis 01/12/2020  . Cerebrovascular accident (CVA) due to thrombosis of right middle cerebral artery ([place])   . Chronic pain syndrome   . Hypokalemia   . Dysphagia, post-stroke   . Acute ischemic right MCA stroke ([place]) 01/07/2020  . Tobacco dependence   . Marijuana dependence ([place])   . Alcohol dependence [facility])     Expected Discharge Date: Expected Discharge Date: 02/13/20  Team Members Present: Physician leading conference: Dr. Alger Simons Care Coodinator Present: Loralee Pacas, LCSWA;Genie Zierra Laroque, RN, MSN Nurse Present: Debroah Loop, RN PT Present: Burnard Bunting, PT OT Present: Turner Daniels, OT SLP Present: Weston Anna, SLP PPS Coordinator present : Gunnar Fusi, SLP     Current Status/Progress Goal Weekly Team Focus  Bowel/Bladder   Continent of b/b; LBM: 03/28  remain continent of b/b  assist with toileting needs prn   Swallow/Nutrition/ Hydration   Regular textures with thin liquids, Mod I  Mod I  Goal Met   ADL's   continued dense hemiplegia with no activation noteds; cont tol estim, min-mod A LB bathing, max A toileting, min-mod A sit to stand; up to MOD A transfers, S UB dressing  MIN A-S overall  NMR, BADL retraining, WB strategies, L attention/UE management, functional transfer training   Mobility   very trace L quad/hip flexor activation, mod assist transfers, max assist gait at rail x10', supervision w/c mobility  min assist transfers, mod assist gait  LLE NMR, progressing gait, postural control/balance   Communication             Safety/Cognition/ Behavioral Observations  Mod A  Min A  complex problem solving, recall with use of strategies   Pain   no c/o pain  remain pain free  assess pain QS and prn   Skin   skin intact  maintain skin integrity  assess skin QS and prn    Rehab Goals Patient on target to meet rehab goals: Yes *See Care Plan and progress notes for long and short-term goals.     Barriers to Discharge  Current Status/Progress Possible Resolutions Date Resolved   Nursing                  PT                    OT                  SLP                SW                Discharge Planning/Teaching Needs:  D/c to home with s/o [name] who will provide assistance to pt. Pt also states his 6 y.o,. son can  help him as well.  Family education as recommended by therapy.   Team Discussion: MD ongoing spasticity L side, less pain, seems depressed, but patient says not depressed.  RN cont B/B, BM 3/28, L side flaccid.  OT S UB dressing, mod LB dressing, max/total toileting, mod A transfers.  PT min/mod transfers, gait max/total at rail, min A w/c level goals.  SLP more engaged, min A complex prob solving.  Will need a ramp at DC for home.   Revisions to Treatment Plan: N/A      Medical Summary Current Status: spastic left hemiparesis, left lower>upper. flat affect may be CVA related. he denies depression Weekly Focus/Goal: tone, orthotics, bp, nutrition  Barriers to Discharge: Medical stability       Continued Need for Acute Rehabilitation Level of Care: The patient requires daily medical management by a physician with specialized training in physical medicine and rehabilitation for the following reasons: Direction of a multidisciplinary physical rehabilitation  program to maximize functional independence : Yes Medical management of patient stability for increased activity during participation in an intensive rehabilitation regime.: Yes Analysis of laboratory values and/or radiology reports with any subsequent need for medication adjustment and/or medical intervention. : Yes   I attest that I was present, lead the team conference, and concur with the assessment and plan of the team.   Retta Diones 01/29/2020, 1:21 PM   Team conference was held via web/ teleconference due to [place] - 19

## 2020-01-30 ENCOUNTER — Encounter (HOSPITAL_COMMUNITY): Payer: BC Managed Care – PPO | Admitting: Psychology

## 2020-01-30 ENCOUNTER — Inpatient Hospital Stay (HOSPITAL_COMMUNITY): Payer: BC Managed Care – PPO | Admitting: Physical Therapy

## 2020-01-30 ENCOUNTER — Inpatient Hospital Stay (HOSPITAL_COMMUNITY): Payer: BC Managed Care – PPO | Admitting: Speech Pathology

## 2020-01-30 ENCOUNTER — Inpatient Hospital Stay (HOSPITAL_COMMUNITY): Payer: BC Managed Care – PPO | Admitting: Occupational Therapy

## 2020-01-30 NOTE — Progress Notes (Signed)
Occupational Therapy Session Note  Patient Details  Name: Derek [name] MRN: [id number] Date of Birth: 1976-01-29  Today's Date: 01/30/2020 OT Individual Time: 5465-6812 OT Individual Time Calculation (min): 55 min    Short Term Goals: Week 3:  OT Short Term Goal 1 (Week 3): Pt will use LUE as stabilizer during grooming with Min multimodal cueing. OT Short Term Goal 2 (Week 3): Patient will don LB clothing with Mod A at sit to stand level. OT Short Term Goal 3 (Week 3): Pt will attend to left environment during grooming tasks with 2-3 cues. OT Short Term Goal 4 (Week 3): Patient will complete toilet transfer to [facility] with Mod A.  Skilled Therapeutic Interventions/Progress Updates:  Session 1: Patient met lying supine in bed in agreement with OT treatment session. Patient notes slight discomfort in hamstrings of RLE this date. Supine to EOB transfer with Min A and Min cueing for sequencing. Patient able to complete lateral scoot to R with Min A and squat-pivot transfer to wc on R with Min A. Stand-pivot transfer to [facility] over toilet in bathroom with Mod A and cueing for foot placement and proximity to commode. Toileting with Max A for hygiene/clothing management in standing with R knee block and BUE supported on RW. Patient completed squat-pivot transfer to wc on R with Min A and use of grab bar. Shower transfer to [facility] via stand-pivot transfer with Mod A and cues for foot placement. Patient able to bathe UB with Min A and LB with Mod A with exclusion of buttocks. UB dressing in supported sitting in wc with Min A and Min cueing for hemi-dressing techniques. LB dressing in sitting/standing with Mod A and BUE supported on RW to hike pants over hips in standing. Session concluded with patient seated in wc with call bell within reach, belt alarm activated, and all needs met.   Session 2: Patient met seated in wc in agreement with OT treatment session. 0/10 pain at rest and with activity. Patient able to  use RLE and RUE to self-propel wc to sink. Patient able to manage breaks on wc with Min cues for locking/unlocking throughout treatment session. Patient completed shaving task in sitting with increased attention to L environment and L side of face. Self-ROM with Mod cues for pace and form. Reaching task with cones positioned on bedside table with incorporation of Sa[place]o placed on L anterior and middle deltoid to facilitate shoulder flexion. Patient requiring total A secondary to flaccidity. Session concluded with patient seated in wc with call bell within reach, belt alarm activated, all needs met, and Saebo on.   Saebo Stim One 330 pulse width 35 Hz pulse rate On 8 sec/ off 8 sec Ramp up/ down 2 sec Symmetrical Biphasic wave form  Max intensity 113m at 500 Ohm load Skin in tact after 60 min unattended  Therapy Documentation Precautions:  Precautions Precautions: Fall Precaution Comments: L neglect, hemiparesis Required Braces or Orthoses: Other Brace Other Brace: L knee immobilizer Restrictions Weight Bearing Restrictions: No   Therapy/Group: Individual Therapy  [place]ia Mincey [name] 01/30/2020, 7:56 AM

## 2020-01-30 NOTE — Progress Notes (Signed)
[place] PHYSICAL MEDICINE & REHABILITATION PROGRESS NOTE   Subjective/Complaints: Up at EOB. Getting up to go to BR  ROS: Patient denies fever, rash, sore throat, blurred vision, nausea, vomiting, diarrhea, cough, shortness of breath or chest pain, joint or back pain, headache, or mood change.   Objective:   No results found. No results for input(s): WBC, HGB, HCT, PLT in the last 72 hours. No results for input(s): NA, K, CL, CO2, GLUCOSE, BUN, CREATININE, CALCIUM in the last 72 hours.  Intake/Output Summary (Last 24 hours) at 01/30/2020 0824 Last data filed at 01/30/2020 0802 Gross per 24 hour  Intake 440 ml  Output 2025 ml  Net -1585 ml     Physical Exam: Vital Signs Blood pressure 107/73, pulse 75, temperature 98.3 F (36.8 C), resp. rate 18, height 6\' 1"  (1.854 m), weight 91.4 kg, SpO2 97 %.  Constitutional: No distress . Vital signs reviewed. HEENT: EOMI, oral membranes moist Neck: supple Cardiovascular: RRR without murmur. No JVD    Respiratory/Chest: CTA Bilaterally without wheezes or rales. Normal effort    GI/Abdomen: BS +, non-tender, non-distended Ext: no clubbing, cyanosis, or edema Psych: flat Musc: No edema in extremities.  No tenderness in extremities. Neuro: Alert Motor: LUE/LLE: 0/5 proximal distal--see no movement today,  2/4 MAS left hamstrings, gastroc, 1/4 LUE RUE and RLE 5/5. Very strong   Assessment/Plan: 1. Functional deficits secondary to right MCA infarct which require 3+ hours per day of interdisciplinary therapy in a comprehensive inpatient rehab setting.  Physiatrist is providing close team supervision and 24 hour management of active medical problems listed below.  Physiatrist and rehab team continue to assess barriers to discharge/monitor patient progress toward functional and medical goals  Care Tool:  Bathing    Body parts bathed by patient: Right arm, Chest, Abdomen(UB only this session)   Body parts bathed by helper: Left arm      Bathing assist Assist Level: Minimal Assistance - Patient > 75%     Upper Body Dressing/Undressing Upper body dressing   What is the patient wearing?: Pull over shirt    Upper body assist Assist Level: Minimal Assistance - Patient > 75%    Lower Body Dressing/Undressing Lower body dressing      What is the patient wearing?: Pants, Underwear/pull up     Lower body assist Assist for lower body dressing: Moderate Assistance - Patient 50 - 74%     Toileting Toileting    Toileting assist Assist for toileting: Maximal Assistance - Patient 25 - 49% Assistive Device Comment: BSC over toilet in bathroom   Transfers Chair/bed transfer  Transfers assist     Chair/bed transfer assist level: Moderate Assistance - Patient 50 - 74%     Locomotion Ambulation   Ambulation assist   Ambulation activity did not occur: Safety/medical concerns  Assist level: Maximal Assistance - Patient 25 - 49% Assistive device: Other (comment)(rail in hallway) Max distance: 30'   Walk 10 feet activity   Assist  Walk 10 feet activity did not occur: Safety/medical concerns  Assist level: Maximal Assistance - Patient 25 - 49% Assistive device: Other (comment)(rail in hallway)   Walk 50 feet activity   Assist Walk 50 feet with 2 turns activity did not occur: Safety/medical concerns         Walk 150 feet activity   Assist Walk 150 feet activity did not occur: Safety/medical concerns         Walk 10 feet on uneven surface  activity  Assist Walk 10 feet on uneven surfaces activity did not occur: Safety/medical concerns         Wheelchair     Assist Will patient use wheelchair at discharge?: Yes Type of Wheelchair: Manual    Wheelchair assist level: Supervision/Verbal cueing Max wheelchair distance: 33'    Wheelchair 50 feet with 2 turns activity    Assist        Assist Level: Supervision/Verbal cueing   Wheelchair 150 feet activity      Assist      Assist Level: Supervision/Verbal cueing   Blood pressure 107/73, pulse 75, temperature 98.3 F (36.8 C), resp. rate 18, height 6\' 1"  (1.854 m), weight 91.4 kg, SpO2 97 %.  Medical Problem List and Plan: 1.  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  Status post negative TEE 01/10/2020  Continue CIR  -Continue PRAFO/WHO at HS    -KI for hamstring tone as needed      2.  Antithrombotics: -DVT/anticoagulation: Lovenox initiated 01/10/2020  -recent dopplers negative             -antiplatelet therapy: Aspirin 325 mg daily 3. Pain Management: Lidoderm patch, oxycodone 5 mg every 6 hours as needed  Controlled on 3/31 4. Mood: Advised emotional support  -neuropsych consult  -pt denies depression at this time             -antipsychotic agents: N/A 5. Neuropsych: This patient is capable of making decisions on his own behalf. 6. Skin/Wound Care: Routine skin checks 7. Fluids/Electrolytes/Nutrition: encourage PO  -continue to monitor  BMP within normal limits on 3/25--recheck Monday.  8.  Dysphagia.  on regular/thins diet   Eating well 9.  HTN:  off BP meds - monitor   Relatively controlled 3/31 10.  Hyperlipidemia: Continue Lipitor 11.  History of tobacco/alcohol/marijuana use.  NicoDerm patch.  reviewed tobacco/drug use in regard to stroke risk 12. Spastic hemiparesis LLE  -fairly maintained at present  Baclofen 10mg  TID--   -tizanidine also at HS  -aggressive ROM with therapy   -PRAFO, KI when up  -?botox as outpt  LOS: 15 days A FACE TO FACE EVALUATION WAS PERFORMED  4/31 01/30/2020, 8:24 AM

## 2020-01-30 NOTE — Plan of Care (Signed)
  Problem: Consults Goal: RH STROKE PATIENT EDUCATION Description: See Patient Education module for education specifics  Outcome: Progressing   Problem: RH BOWEL ELIMINATION Goal: RH STG MANAGE BOWEL WITH ASSISTANCE Description: STG Manage Bowel with Assistance. Outcome: Progressing Goal: RH STG MANAGE BOWEL W/MEDICATION W/ASSISTANCE Description: STG Manage Bowel with Medication with Assistance. Outcome: Progressing   Problem: RH BLADDER ELIMINATION Goal: RH STG MANAGE BLADDER WITH ASSISTANCE Description: STG Manage Bladder With Assistance Outcome: Progressing Goal: RH STG MANAGE BLADDER WITH MEDICATION WITH ASSISTANCE Description: STG Manage Bladder With Medication With Assistance. Outcome: Progressing   Problem: RH SKIN INTEGRITY Goal: RH STG SKIN FREE OF INFECTION/BREAKDOWN Outcome: Progressing   Problem: RH SAFETY Goal: RH STG ADHERE TO SAFETY PRECAUTIONS W/ASSISTANCE/DEVICE Description: STG Adhere to Safety Precautions With Assistance/Device. Outcome: Progressing   Problem: RH PAIN MANAGEMENT Goal: RH STG PAIN MANAGED AT OR BELOW PT'S PAIN GOAL Outcome: Progressing   Problem: RH KNOWLEDGE DEFICIT Goal: RH STG INCREASE KNOWLEDGE OF DIABETES Outcome: Progressing Goal: RH STG INCREASE KNOWLEDGE OF HYPERTENSION Outcome: Progressing Goal: RH STG INCREASE KNOWLEDGE OF STROKE PROPHYLAXIS Outcome: Progressing   

## 2020-01-30 NOTE — Progress Notes (Signed)
Speech Language Pathology Weekly Progress and Session Note  Patient Details  Name: STOY FENN MRN: [id number] Date of Birth: 08/20/1976  Beginning of progress report period: January 23, 2020 End of progress report period: January 30, 2020  Today's Date: 01/30/2020 SLP Individual Time: 1030-1100 SLP Individual Time Calculation (min): 30 min  Short Term Goals: Week 2: SLP Short Term Goal 1 (Week 2): Patient will demonstrate sustained attention to functional tasks for 45 minutes with Min verbal cues for redirection. SLP Short Term Goal 1 - Progress (Week 2): Met SLP Short Term Goal 2 (Week 2): Patient will recall new, daily information with Min verbal cues. SLP Short Term Goal 2 - Progress (Week 2): Met SLP Short Term Goal 3 (Week 2): Patient will self-monitor and correct errors during functional tasks with Min verbal cues. SLP Short Term Goal 3 - Progress (Week 2): Met SLP Short Term Goal 4 (Week 2): Patient will demonstrate functional problem solving for mildly complex tasks with Min verbal cues. SLP Short Term Goal 4 - Progress (Week 2): Met    New Short Term Goals: Week 3: SLP Short Term Goal 1 (Week 3): Patient will demonstrate selective attention to functional tasks for 45 minutes with Min verbal cues for redirection in a mildly distracting enviornment. SLP Short Term Goal 2 (Week 3): Patient will recall new, daily information with supervision verbal cues. SLP Short Term Goal 3 (Week 3): Patient will self-monitor and correct errors during functional tasks with supervision verbal cues. SLP Short Term Goal 4 (Week 3): Patient will demonstrate functional problem solving for mildly complex tasks with supervision verbal cues.  Weekly Progress Updates: Patient continues to make functional gains and has met 4 of 4 STGs this reporting period. Currently, patient requires overall Min A verbal cues to complete functional and mildly complex tasks safely in regards to problem solving, emergent  awareness, attention and recall. Patient and family education is ongoing. Patient would benefit from continued skilled SLP intervention to maximize his cognitive functioning and overall functional independence prior to discharge.    Intensity: Minumum of 1-2 x/day, 30 to 90 minutes Frequency: 3 to 5 out of 7 days Duration/Length of Stay: 4/14 Treatment/Interventions: Cognitive remediation/compensation;Dysphagia/aspiration precaution training;Internal/external aids;Therapeutic Activities;Environmental controls;Cueing hierarchy;Functional tasks;Multimodal communication approach;Patient/family education   Daily Session  Skilled Therapeutic Interventions: Skilled treatment session focused on cognitive goals. Upon arrival, patient requested to brush his teeth. Patient completed task with overall Mod I without assistance for set-up. SLP also facilitated session by providing extra time and overall Min-Mod A verbal cues for problem solving during a mildly complex visual-spatial task. Patient left upright in the wheelchair with alarm on and all needs within reach. Continue with current plan of care.      Pain No/Denies Pain   Therapy/Group: Individual Therapy  Unique Searfoss 01/30/2020, 6:28 AM

## 2020-01-30 NOTE — Consult Note (Signed)
Neuropsychological Consultation   Patient:   Derek [name]   DOB:   February 18, 1976  MR Number:  [id number]  Location:  [facility] [place] [id number] [place] [address] Dept: [place]: [phone]           Date of Service:   01/30/2020  Start Time:   1 PM End Time:   2 PM  Provider/Observer:  Ilean Skill, Psy.D.       Clinical Neuropsychologist       Billing Code/Service: 96158/96159  Chief Complaint:    Derek [name] is a 44 year old male with a history of alcohol/tobacco/marijuana use as well as chronic back pain.  Patient has been working as a Programmer, systems.  The patient presented on 01/07/2020 with left hemiparesis.  Elevated blood pressure was noted.  Cranial CT scan showed right MCA infarction with mass-effect possible petechial hemorrhage in portions of the right basal ganglia.  Infarcts extended to the posterior cerebral right frontal and adjacent right parietal lobe.  Thrombosis/embolus in the M1 segment of right middle cerebral artery noted.  MRI showed acute right MCA infarction involving basal ganglia and right parietal lobe.  Hemorrhagic transformation of the right basal ganglia infarction with mass-effect on the lateral ventricle.  Follow-up repeat CT scan on 3/10 involving right MCA infarction again with hemorrhagic transformation, 3 mm right to left shift.  By 3/11 there was no new hemorrhage hydrocephalus or worsening mass-effect.  Once therapy evaluations were completed the patient was admitted for the comprehensive rehabilitation program.  Reason for Service:  The patient was referred for neuropsychological consultation due to coping and adjustment issues.  Below is the HPI for the current mission.  HPI: Derek [name] is a 44 year old right-handed male with history of alcohol/tobacco/marijuana use as well as chronic back pain. History taken from chart review.  Patient lives with girlfriend and independent prior to admission. He is employed as a Programmer, systems. Presented 01/07/2020 with left hemiparesis. Blood pressure 184/110. Cranial CT scan showed right MCA infarction with mass-effect possible petechial hemorrhage in portions of the right basal ganglia. Infarcts extending to the posterior superior right frontal and adjacent right parietal lobe. Suspect thrombus/embolus in the M1 segment of right middle cerebral artery. MRI showed acute right MCA infarction involving basal ganglia and right parietal lobe. Hemorrhagic transformation of the right basal ganglia infarction with mass-effect on the lateral ventricle. Echocardiogram with ejection fraction 65%. Lower extremity Dopplers negative for DVT. Admission chemistries unremarkable except glucose 134, WBC 14,300, CK 787, blood cultures no growth to date, lactic acid within normal limits 1.9, urine culture no growth, urine drug screen positive marijuana. Neurology consulted with TEE completed 01/10/2020 showing no PFO /Thrombus. and currently maintained on aspirin 325 mg daily for CVA prophylaxis. Hold on DAPT at this time given hemorrhagic transformation. Follow-up repeat cranial CT scan 01/09/2020 evolving right MCA infarction again with hemorrhagic transformation, 3 mm right to left shift. No plan for 3% saline at this time and scan was again completed 01/10/2020 showing no new hemorrhage hydrocephalus or worsening mass-effect. Lovenox was initiated for DVT prophylaxis 01/10/2020. Patient did have a low-grade fever 100.5 leukocytosis 14,300 improved to 7.0. Urine culture no growth urinalysis negative blood cultures no growth to date. Therapy evaluations completed and patient was admitted for a comprehensive rehab program   Current Status:  The patient continued to be well oriented today and was able to verbalize some of his  questions or concerns with deficiency.  Patient describes continuing and ongoing  hemiparesis on his left side concerns about how this will impact his ability to work in the future.  The patient reports that he is not identifying significant changes over the past couple of days in motor functioning but is making some functional gains in therapy.  The patient denied any significant depression or anxiety becoming exacerbated and in fact requested that PT and OT become more challenging and push him harder than is happening right now.  Behavioral Observation: Derek [name]  presents as a 44 y.o.-year-old Right African American Male who appeared his stated age. his dress was Appropriate and he was Well Groomed and his manners were Appropriate to the situation.  his participation was indicative of Appropriate and Attentive behaviors.  There were any physical disabilities noted.  he displayed an appropriate level of cooperation and motivation.     Interactions:    Active Appropriate and Attentive  Attention:   within normal limits and attention span and concentration were age appropriate  Memory:   within normal limits; recent and remote memory intact  Visuo-spatial:  not examined  Speech (Volume):  low  Speech:   normal; normal  Thought Process:  Coherent and Relevant  Though Content:  WNL; not suicidal and not homicidal  Orientation:   person, place, time/date and situation  Judgment:   Good  Planning:   Good  Affect:    Blunted and Lethargic  Mood:    Dysphoric  Insight:   Fair  Intelligence:   normal  Substance Use:  There is a documented history of alcohol, marijuana and tobacco abuse confirmed by the patient.    Medical History:   Past Medical History:  Diagnosis Date  . 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 Alcohol dependence (HCC)   . Back pain   . Marijuana dependence (HCC)   . Tobacco dependence        Abuse/Trauma History: The patient did talk about an event that happened at work some time ago in which one of his coworkers  developed chest pain and the patient was there to try to help out.  The patient called 911 and had the coworker sit down.  The patient's coworker was having a massive heart attack and died before EMS was able to arrive at the scene with the patient they are taking care of him.  Psychiatric History:  No prior psychiatric symptoms noted  Family Med/Psych History:  Family History  Problem Relation Age of Onset  . Stroke Neg Hx   . CAD Neg Hx     Risk of Suicide/Violence: virtually non-existent the patient denies any suicidal or homicidal ideation.  Impression/DX:  Derek [name] is a 44 year old male with a history of alcohol/tobacco/marijuana use as well as chronic back pain.  Patient has been working as a Agricultural consultant.  The patient presented on 01/07/2020 with left hemiparesis.  Elevated blood pressure was noted.  Cranial CT scan showed right MCA infarction with mass-effect possible petechial hemorrhage in portions of the right basal ganglia.  Infarcts extended to the posterior cerebral right frontal and adjacent right parietal lobe.  Thrombosis/embolus in the M1 segment of right middle cerebral artery noted.  MRI showed acute right MCA infarction involving basal ganglia and right parietal lobe.  Hemorrhagic transformation of the right basal ganglia infarction with mass-effect on the lateral ventricle.  Follow-up repeat CT scan on 3/10 involving right MCA infarction again with  hemorrhagic transformation, 3 mm right to left shift.  By 3/11 there was no new hemorrhage hydrocephalus or worsening mass-effect.  Once therapy evaluations were completed the patient was admitted for the comprehensive rehabilitation program.  The patient continued to be well oriented today and was able to verbalize some of his questions or concerns with deficiency.  Patient describes continuing and ongoing hemiparesis on his left side concerns about how this will impact his ability to work in the future.  The  patient reports that he is not identifying significant changes over the past couple of days in motor functioning but is making some functional gains in therapy.  The patient denied any significant depression or anxiety becoming exacerbated and in fact requested that PT and OT become more challenging and push him harder than is happening right now.  Disposition/Plan:  Will follow up with the patient next week and continue to work on coping and adjustment issues with her residual motor deficits and coping with residual effects of his stroke.  Diagnosis:    Right middle cerebral artery stroke [facility]) - Plan: Ambulatory referral to Neurology         Electronically Signed   _______________________ Arley Phenix, Psy.D.

## 2020-01-30 NOTE — Progress Notes (Signed)
Physical Therapy Weekly Progress Note  Patient Details  Name: Derek [name] MRN: [id number] Date of Birth: [birth year]  Beginning of progress report period: January 23, 2020 End of progress report period: January 30, 2020  Today's Date: 01/30/2020 PT Individual Time: 4627-0350 PT Individual Time Calculation (min): 69 min   Patient has met 3 of 3 short term goals. Pt continues to make steady progress towards LTGs. He is consistently performing sit<>stands and transfers towards R side w/ min assist, and self-propelling w/c w/ supervision via R hemi technique. He compensates well for L hemiplegia and demonstrates improved attention to L side during functional mobility and managing LUE and LLE with positioning. He continues to demonstrate minimal muscle activation on L side w/ inconsistent L quad and hip flexor activation palpated during WB/standing activities. This limits gait abilities as gait remains max-total assist and is non-functional at this time.   Patient continues to demonstrate the following deficits muscle weakness and muscle paralysis, decreased cardiorespiratoy endurance, abnormal tone, unbalanced muscle activation, decreased coordination and decreased motor planning, decreased problem solving and delayed processing and decreased sitting balance, decreased standing balance, decreased postural control, hemiplegia and decreased balance strategies and therefore will continue to benefit from skilled PT intervention to increase functional independence with mobility.  Patient progressing toward long term goals..  Continue plan of care.  PT Short Term Goals Week 2:  PT Short Term Goal 1 (Week 2): Pt will initiate gait training PT Short Term Goal 1 - Progress (Week 2): Met PT Short Term Goal 2 (Week 2): Pt will maintain static standing balance w/ min assist consistently PT Short Term Goal 2 - Progress (Week 2): Met PT Short Term Goal 3 (Week 2): Pt will maintain dynamic sitting balance w/  supervision PT Short Term Goal 3 - Progress (Week 2): Met Week 3:  PT Short Term Goal 1 (Week 3): Pt will ambulated 30' w/ max assist w/ LRAD consistently PT Short Term Goal 2 (Week 3): Pt will transfer towards L side w/ mod assist PT Short Term Goal 3 (Week 3): Pt will demonstrate trace muscle activation in L quad consistently during functional mobility  Skilled Therapeutic Interventions/Progress Updates:   Pt received in w/c, agreeable to therapy and denies pain. Pt self-propelled w/c to/from therapy gym and around unit w/ supervision via R hemi technique. Session focused on LLE muscle activation/NMR. Performed multiple reps of sit<>stands both at rail and w/o UE support from mat surface. Performed R forward and backward stepping w/ min manual facilitation of L lateral weight shifting and tactile cues at L quad for activation during weight acceptance. Palpable activation felt ~25% of the time. Sit<>stands from mat surface w/ min assist and mod-max assist to stabilize and maintain midline in static stance, decreased assistance needed w/ mirror for visual feedback for equal weight distribution. Tactile and verbal cues for L quad activation w/ mod-max manual assist for neutral L knee extension. Performed multiple reps of standing for 10-20 sec at a time. Additionally worked on static standing w/o UE support from prolonged periods of time, 2+ min. Performed static stance w/ min-mod assist while matching playing cards, verbal and manual assist to maintain full L knee extension w/ weight acceptance. Performed NuStep 5 min @ level 1 w/ BLEs only to work on reciprocal movement pattern, LLE muscle activation, and limb dissociation. Tactile and verbal cues throughout for L quad and hamstring activation. Pt able to maintain fluid and reciprocal movement pattern w/ only intermittent manual assist from therapist. Returned  to room total assist via w/c. Ended session in w/c, all needs in reach.   Therapy  Documentation Precautions:  Precautions Precautions: Fall Precaution Comments: L neglect, hemiparesis Required Braces or Orthoses: Other Brace Other Brace: L knee immobilizer Restrictions Weight Bearing Restrictions: No Vital Signs: Therapy Vitals Temp: 97.7 F (36.5 C) Temp Source: Oral Pulse Rate: 81 Resp: 19 BP: 114/73 Patient Position (if appropriate): Sitting Oxygen Therapy SpO2: 99 % O2 Device: Room Air   Therapy/Group: Individual Therapy  Iran Rowe Clent Demark 01/30/2020, 3:25 PM

## 2020-01-30 NOTE — Progress Notes (Signed)
Social Work Patient ID: Derek [name], male   DOB: 1975/12/18, 44 y.o.   MRN: [id number]   SW received call from [employer]ie[employer] ([phone]/f:[phone]) stating she did receive notes on 3/30 but needs notes for 3/23 so they can complete the review. States these notes are needed before they can approve an extension. SW relayed information to appropriate staff.   [name], MSW, LCSWA Office: [phone] Cell: [phone] Fax: [phone]

## 2020-01-31 ENCOUNTER — Inpatient Hospital Stay (HOSPITAL_COMMUNITY): Payer: BC Managed Care – PPO | Admitting: Physical Therapy

## 2020-01-31 ENCOUNTER — Inpatient Hospital Stay (HOSPITAL_COMMUNITY): Payer: BC Managed Care – PPO | Admitting: Occupational Therapy

## 2020-01-31 ENCOUNTER — Inpatient Hospital Stay (HOSPITAL_COMMUNITY): Payer: BC Managed Care – PPO | Admitting: Speech Pathology

## 2020-01-31 MED ORDER — TIZANIDINE HCL 2 MG PO TABS
2.0000 mg | ORAL_TABLET | Freq: Three times a day (TID) | ORAL | Status: DC
  Administered 2020-01-31 – 2020-02-13 (×39): 2 mg via ORAL
  Filled 2020-01-31 (×39): qty 1

## 2020-01-31 NOTE — Progress Notes (Signed)
Speech Language Pathology Daily Session Note  Patient Details  Name: Derek [name] MRN: [id number] Date of Birth: [birth year]  Today's Date: 01/31/2020 SLP Individual Time: 5217-4715 SLP Individual Time Calculation (min): 40 min  Short Term Goals: Week 3: SLP Short Term Goal 1 (Week 3): Patient will demonstrate selective attention to functional tasks for 45 minutes with Min verbal cues for redirection in a mildly distracting enviornment. SLP Short Term Goal 2 (Week 3): Patient will recall new, daily information with supervision verbal cues. SLP Short Term Goal 3 (Week 3): Patient will self-monitor and correct errors during functional tasks with supervision verbal cues. SLP Short Term Goal 4 (Week 3): Patient will demonstrate functional problem solving for mildly complex tasks with supervision verbal cues.  Skilled Therapeutic Interventions: Skilled treatment session focused on cognitive goals. SLP facilitated session by providing extra time and Mod A verbal cues for left visual scanning during creation of a grocery list, as well as searching within the grocery ad for items on his list. Patient required supervision level verbal cues for selective attention to task for ~30 minutes. Patient self-propelled his wheelchair to and from the SLP office with Mod I. Patient left upright in wheelchair with alarm on and all needs within reach. Continue with current plan of care.      Pain No/Denies Pain   Therapy/Group: Individual Therapy  Legna Mausolf 01/31/2020, 3:46 PM

## 2020-01-31 NOTE — Progress Notes (Signed)
[facility] PHYSICAL MEDICINE & REHABILITATION PROGRESS NOTE   Subjective/Complaints: Up in bed. In good spirits. Left leg still quite tight  ROS: Patient denies fever, rash, sore throat, blurred vision, nausea, vomiting, diarrhea, cough, shortness of breath or chest pain, joint or back pain, headache, or mood change.    Objective:   No results found. No results for input(s): WBC, HGB, HCT, PLT in the last 72 hours. No results for input(s): NA, K, CL, CO2, GLUCOSE, BUN, CREATININE, CALCIUM in the last 72 hours.  Intake/Output Summary (Last 24 hours) at 01/31/2020 1118 Last data filed at 01/31/2020 1000 Gross per 24 hour  Intake 716 ml  Output 1251 ml  Net -535 ml     Physical Exam: Vital Signs Blood pressure 110/70, pulse 78, temperature 98.1 F (36.7 C), temperature source Oral, resp. rate 20, height 6\' 1"  (1.854 m), weight 91.4 kg, SpO2 98 %.  Constitutional: No distress . Vital signs reviewed. HEENT: EOMI, oral membranes moist Neck: supple Cardiovascular: RRR without murmur. No JVD    Respiratory/Chest: CTA Bilaterally without wheezes or rales. Normal effort    GI/Abdomen: BS +, non-tender, non-distended Ext: no clubbing, cyanosis, or edema Psych: pleasant and cooperative. More engaging today Musc: No edema in extremities.  No tenderness in extremities. Neuro: Alert Motor: LUE/LLE: 0/5 proximal distal--see no movement today,  2-3/4 MAS left quads today, gastroc, 2/4 LUE RUE and RLE 5/5. Very strong   Assessment/Plan: 1. Functional deficits secondary to right MCA infarct which require 3+ hours per day of interdisciplinary therapy in a comprehensive inpatient rehab setting.  Physiatrist is providing close team supervision and 24 hour management of active medical problems listed below.  Physiatrist and rehab team continue to assess barriers to discharge/monitor patient progress toward functional and medical goals  Care Tool:  Bathing    Body parts bathed by patient:  Right arm, Left arm, Chest, Abdomen, Front perineal area, Right upper leg, Left upper leg, Left lower leg, Face, Right lower leg   Body parts bathed by helper: Buttocks     Bathing assist Assist Level: Moderate Assistance - Patient 50 - 74%     Upper Body Dressing/Undressing Upper body dressing   What is the patient wearing?: Pull over shirt    Upper body assist Assist Level: Minimal Assistance - Patient > 75%    Lower Body Dressing/Undressing Lower body dressing      What is the patient wearing?: Pants, Underwear/pull up     Lower body assist Assist for lower body dressing: Moderate Assistance - Patient 50 - 74%     Toileting Toileting    Toileting assist Assist for toileting: Moderate Assistance - Patient 50 - 74% Assistive Device Comment: BSC over toilet in bathroom   Transfers Chair/bed transfer  Transfers assist     Chair/bed transfer assist level: Moderate Assistance - Patient 50 - 74%     Locomotion Ambulation   Ambulation assist   Ambulation activity did not occur: Safety/medical concerns  Assist level: Maximal Assistance - Patient 25 - 49% Assistive device: Other (comment)(rail in hallway) Max distance: 30'   Walk 10 feet activity   Assist  Walk 10 feet activity did not occur: Safety/medical concerns  Assist level: Maximal Assistance - Patient 25 - 49% Assistive device: Other (comment)(rail in hallway)   Walk 50 feet activity   Assist Walk 50 feet with 2 turns activity did not occur: Safety/medical concerns         Walk 150 feet activity   Assist  Walk 150 feet activity did not occur: Safety/medical concerns         Walk 10 feet on uneven surface  activity   Assist Walk 10 feet on uneven surfaces activity did not occur: Safety/medical concerns         Wheelchair     Assist Will patient use wheelchair at discharge?: Yes Type of Wheelchair: Manual    Wheelchair assist level: Supervision/Verbal cueing Max  wheelchair distance: 150'    Wheelchair 50 feet with 2 turns activity    Assist        Assist Level: Supervision/Verbal cueing   Wheelchair 150 feet activity     Assist      Assist Level: Supervision/Verbal cueing   Blood pressure 110/70, pulse 78, temperature 98.1 F (36.7 C), temperature source Oral, resp. rate 20, height 6\' 1"  (1.854 m), weight 91.4 kg, SpO2 98 %.  Medical Problem List and Plan: 1.  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  Status post negative TEE 01/10/2020  Continue CIR  -Continue PRAFO/WHO at HS    -KI for hamstring tone as needed      2.  Antithrombotics: -DVT/anticoagulation: Lovenox initiated 01/10/2020  -recent dopplers negative             -antiplatelet therapy: Aspirin 325 mg daily 3. Pain Management: Lidoderm patch, oxycodone 5 mg every 6 hours as needed  Controlled on 4/1 4. Mood: Advised emotional support  -neuropsych consult  -pt denies depression              -antipsychotic agents: N/A 5. Neuropsych: This patient is capable of making decisions on his own behalf. 6. Skin/Wound Care: Routine skin checks 7. Fluids/Electrolytes/Nutrition: encourage PO  -continue to monitor  BMP within normal limits on 3/25--recheck Monday.  8.  Dysphagia.  on regular/thins diet   Eating well 9.  HTN:  off BP meds - monitor   Relatively controlled 4/1 10.  Hyperlipidemia: Continue Lipitor 11.  History of tobacco/alcohol/marijuana use.  NicoDerm patch.  reviewed tobacco/drug use in regard to stroke risk 12. Spastic hemiparesis LLE  -fairly maintained at present  Baclofen 10mg  TID--   -4/1: increase tizanidine to 2mg  tid  -aggressive ROM with therapy   -PRAFO, KI when up  -?botox as outpt  LOS: 16 days A FACE TO FACE EVALUATION WAS PERFORMED  01/31/2020, 11:18 AM

## 2020-01-31 NOTE — Progress Notes (Signed)
Occupational Therapy Session Note  Patient Details  Name: Derek [name] MRN: [id number] Date of Birth: [birth year]  Today's Date: 01/31/2020 OT Individual Time: 7425-9563 OT Individual Time Calculation (min): 55 min   OT Individual Time: 1300-1330 OT Individual Time Calculation (min):  30 min   Short Term Goals: Week 3:  OT Short Term Goal 1 (Week 3): Pt will use LUE as stabilizer during grooming with Min multimodal cueing. OT Short Term Goal 2 (Week 3): Patient will don LB clothing with Mod A at sit to stand level. OT Short Term Goal 3 (Week 3): Pt will attend to left environment during grooming tasks with 2-3 cues. OT Short Term Goal 4 (Week 3): Patient will complete toilet transfer to [facility] with Mod A.  Skilled Therapeutic Interventions/Progress Updates:  Session 1: Patient met lying supine in bed in agreement with OT treatment session. Patient c/o tightness in LLE at hamstring and pain in LUE at shoulder. Supine to EOB with Min A and Min cueing for attention to LUE. Gentle stretching of hamstring seated EOB with forward bending x5 reps with 10 second hold. Squat-pivot transfer to wc on R with Min A. Wc mobility in room to gather UB clothing from drawers with supervision A. Seated in front of mirror for visual feedback, patient able to don shirt with Min A and Mod cues for attention to L. Oral hygiene seated at sink level with supervision A and cues for sequencing. Hand over hand and total A for using LUE as stabilizer on sink surface secondary to flaccidity. Patient indicated need to void with ability to complete sit to stand at toilet level with Mod A and use of R grab bar. Mod A required for toileting/hygiene/clothing management and placement of urinal. Patient transported to and from therapy gym with total assist for time management. Manual therapy and scapular mobilization for LUE with patient in supine. AAROM/PROM with bilateral hands on red boomwhacker and total A for shoulder press 2 sets  x10 reps each to facilitate elongation of soft tissue, prevent contractures, and promote protraction/retraction of scapulae. Session concluded with patient seated in wc with call bell within reach, belt alarm activated and all needs met.   Session 2: Patient met seated in wc in agreement with OT treatment session. 0/10 pain reported at rest and with activity. Wc mobility to therapy gym with hemi technique and RUE/LUE with supervision A and cueing for locking/unlocking breaks. Mirror box therapy with focus on improvement of motor function in prep for ADLs, nerve pain reduction, reduction of L neglect, and improvement of sensory impairment with wrist flexion/extension, flexion/extension of digits, finger opposition, and grasp/release of foam blocks with non-affected UE. Patient transported back to room with total assist for time management.    Therapy Documentation Precautions:  Precautions Precautions: Fall Precaution Comments: L neglect, hemiparesis Required Braces or Orthoses: Other Brace Other Brace: L knee immobilizer Restrictions Weight Bearing Restrictions: No  Therapy/Group: Individual Therapy  Dwight Adamczak [name] 01/31/2020, 7:50 AM

## 2020-01-31 NOTE — Progress Notes (Signed)
Physical Therapy Session Note  Patient Details  Name: Derek [name] MRN: [id number] Date of Birth: [birth year]  Today's Date: 01/31/2020 PT Individual Time: 1400-1440 PT Individual Time Calculation (min): 40 min   Short Term Goals: Week 3:  PT Short Term Goal 1 (Week 3): Pt will ambulated 30' w/ max assist w/ LRAD consistently PT Short Term Goal 2 (Week 3): Pt will transfer towards L side w/ mod assist PT Short Term Goal 3 (Week 3): Pt will demonstrate trace muscle activation in L quad consistently during functional mobility  Skilled Therapeutic Interventions/Progress Updates:   Pt in w/c upon arrival and agreeable to therapy, no c/o pain. Pt self-propelled w/c to/from therapy gym w/ supervision via R hemi technique. Pt set-up transfer to edge of mat w/o assist from therapist. Performed stand pivot to edge of mat w/ mod assist. Worked on sit<>stands from mat surface w/o UE support to stabilize in stance. Stood w/ min-mod assist w/ tactile and verbal cues for L quad activation. Performed lateral weight shifting w/ mod assist, palpable L quad contraction during weight acceptance ~25% of the time. Pt asking about assistance w/ bed mobility as he is having trouble sleeping. Practiced rolling to both sides and into prone w/ mod assist. Pt most comfortable in sidelying on either side. Instructed on how to direct his care w/ nursing staff to help him reposition into sidelying w/ pillows to support L hemi side. Pt self-propelled w/c back to room and pt practiced this in hospital bed as well. Pt reports it was more comfortable. Placed education sheet for nursing staff at [facility] as well as written reminder that pt does not need to wear PRAFO at night as therapist is beginning to implement LAFO use during the day. Provided pt w/ passive LLE stretching, knee-to-chest with 30 sec hold x10 reps. Pt w/ increased L hamstring/quad tone today and pt appreciative as it relieved some pain/tightness. Ended session in supine,  all needs in reach.   Therapy Documentation Precautions:  Precautions Precautions: Fall Precaution Comments: L neglect, hemiparesis Required Braces or Orthoses: Other Brace Other Brace: L knee immobilizer Restrictions Weight Bearing Restrictions: No  Therapy/Group: Individual Therapy  Rorik Vespa [name] 01/31/2020, 3:36 PM

## 2020-02-01 ENCOUNTER — Inpatient Hospital Stay (HOSPITAL_COMMUNITY): Payer: BC Managed Care – PPO | Admitting: Occupational Therapy

## 2020-02-01 ENCOUNTER — Inpatient Hospital Stay (HOSPITAL_COMMUNITY): Payer: BC Managed Care – PPO | Admitting: Physical Therapy

## 2020-02-01 ENCOUNTER — Inpatient Hospital Stay (HOSPITAL_COMMUNITY): Payer: BC Managed Care – PPO | Admitting: Speech Pathology

## 2020-02-01 NOTE — Progress Notes (Signed)
Speech Language Pathology Daily Session Note  Patient Details  Name: Derek [name] MRN: [id number] Date of Birth: 08/28/1976  Today's Date: 02/01/2020 SLP Individual Time: 0730-0830 SLP Individual Time Calculation (min): 60 min  Short Term Goals: Week 3: SLP Short Term Goal 1 (Week 3): Patient will demonstrate selective attention to functional tasks for 45 minutes with Min verbal cues for redirection in a mildly distracting enviornment. SLP Short Term Goal 2 (Week 3): Patient will recall new, daily information with supervision verbal cues. SLP Short Term Goal 3 (Week 3): Patient will self-monitor and correct errors during functional tasks with supervision verbal cues. SLP Short Term Goal 4 (Week 3): Patient will demonstrate functional problem solving for mildly complex tasks with supervision verbal cues.  Skilled Therapeutic Interventions: Skilled treatment session focused on cognitive goals. SLP facilitated session by providing Min verbal cues for left visual scanning and Mod verbal and visual cues for functional problem solving and emergent awareness of errors during a complex money management task. Patient also performed basic self-care tasks at the sink with supervision and donned his shoes with assist to hold his LLE in the figure four position. Patient left upright in wheelchair with alarm on and all needs within reach. Continue with current plan of care      Pain No/Denies Pain   Therapy/Group: Individual Therapy  Derek [name] 02/01/2020, 2:13 PM

## 2020-02-01 NOTE — Progress Notes (Signed)
Occupational Therapy Session Note  Patient Details  Name: Derek [name] MRN: [id number] Date of Birth: [birth year]  Today's Date: 02/01/2020 OT Individual Time: 1303-1400 OT Individual Time Calculation (min): 57 min    Short Term Goals: Week 3:  OT Short Term Goal 1 (Week 3): Pt will use LUE as stabilizer during grooming with Min multimodal cueing. OT Short Term Goal 2 (Week 3): Patient will don LB clothing with Mod A at sit to stand level. OT Short Term Goal 3 (Week 3): Pt will attend to left environment during grooming tasks with 2-3 cues. OT Short Term Goal 4 (Week 3): Patient will complete toilet transfer to [facility] with Mod A.  Skilled Therapeutic Interventions/Progress Updates:  Patient met seated in wc in agreement with OT treatment session. 0/10 pain at rest and with activity. Wc mobility to dayroom with supervision A and cues for locking/unlocking breaks. Mirror box therapy (30 min) with focus on improvement of motor function in prep for ADLs, nerve pain reduction, reduction of L neglect, and improvement of sensory impairment with wrist flexion/extension, flexion/extension of digits, finger opposition, and grasp/release of foam blocks with non-affected UE. Self-ROM 1 set x 10 reps each to facilitate soft tissue elongation and reduce risk of contractures. Patient with difficulty self-ranging in forearm supination/pronation with education on diligence with exercises a few time each day. Patient expressed verbal understanding. Sit to stand from wc to RW x4 with Mod-Min A, L knee block and visual feedback from mirror with cues for orientation to midline. Wc mobility back to room with supervision A and squat-pivot transfer from wc to bed with Mod A and cues for foot placement. Session concluded with patient lying supine in bed with call bell within reach, bed alarm activated, and all needs met.    Therapy Documentation Precautions:  Precautions Precautions: Fall Precaution Comments: L neglect,  hemiparesis Required Braces or Orthoses: Other Brace Other Brace: L knee immobilizer Restrictions Weight Bearing Restrictions: No   Therapy/Group: Individual Therapy  [name] 02/01/2020, 12:59 PM

## 2020-02-01 NOTE — Progress Notes (Signed)
[place] PHYSICAL MEDICINE & REHABILITATION PROGRESS NOTE   Subjective/Complaints: Had a pretty good night. Spasms better. No new complaints this morning  ROS: Patient denies fever, rash, sore throat, blurred vision, nausea, vomiting, diarrhea, cough, shortness of breath or chest pain, joint or back pain, headache, or mood change.    Objective:   No results found. No results for input(s): WBC, HGB, HCT, PLT in the last 72 hours. No results for input(s): NA, K, CL, CO2, GLUCOSE, BUN, CREATININE, CALCIUM in the last 72 hours.  Intake/Output Summary (Last 24 hours) at 02/01/2020 1032 Last data filed at 02/01/2020 0730 Gross per 24 hour  Intake 700 ml  Output 1550 ml  Net -850 ml     Physical Exam: Vital Signs Blood pressure 112/74, pulse 66, temperature 98.2 F (36.8 C), resp. rate 18, height 6\' 1"  (1.854 m), weight 91.4 kg, SpO2 99 %.  Constitutional: No distress . Vital signs reviewed. HEENT: EOMI, oral membranes moist Neck: supple Cardiovascular: RRR without murmur. No JVD    Respiratory/Chest: CTA Bilaterally without wheezes or rales. Normal effort    GI/Abdomen: BS +, non-tender, non-distended Ext: no clubbing, cyanosis, or edema Psych: pleasant, but a little flatter today, slower processing Musc: No edema in extremities.  No tenderness in extremities. Neuro: Alert Motor: LUE/LLE: 0/5 proximal distal--see no movement today,  1-2/4 MAS left quads today, gastroc, 1/4 LUE RUE and RLE 5/5. Very strong on right   Assessment/Plan: 1. Functional deficits secondary to right MCA infarct which require 3+ hours per day of interdisciplinary therapy in a comprehensive inpatient rehab setting.  Physiatrist is providing close team supervision and 24 hour management of active medical problems listed below.  Physiatrist and rehab team continue to assess barriers to discharge/monitor patient progress toward functional and medical goals  Care Tool:  Bathing    Body parts bathed by  patient: Right arm, Left arm, Chest, Abdomen, Front perineal area, Right upper leg, Left upper leg, Left lower leg, Face, Right lower leg   Body parts bathed by helper: Buttocks     Bathing assist Assist Level: Moderate Assistance - Patient 50 - 74%     Upper Body Dressing/Undressing Upper body dressing   What is the patient wearing?: Pull over shirt    Upper body assist Assist Level: Minimal Assistance - Patient > 75%    Lower Body Dressing/Undressing Lower body dressing      What is the patient wearing?: Pants, Underwear/pull up     Lower body assist Assist for lower body dressing: Moderate Assistance - Patient 50 - 74%     Toileting Toileting    Toileting assist Assist for toileting: Moderate Assistance - Patient 50 - 74% Assistive Device Comment: BSC over toilet in bathroom   Transfers Chair/bed transfer  Transfers assist     Chair/bed transfer assist level: Moderate Assistance - Patient 50 - 74%     Locomotion Ambulation   Ambulation assist   Ambulation activity did not occur: Safety/medical concerns  Assist level: Maximal Assistance - Patient 25 - 49% Assistive device: Other (comment)(rail in hallway) Max distance: 20'   Walk 10 feet activity   Assist  Walk 10 feet activity did not occur: Safety/medical concerns  Assist level: Maximal Assistance - Patient 25 - 49% Assistive device: Other (comment)(rail in hallway)   Walk 50 feet activity   Assist Walk 50 feet with 2 turns activity did not occur: Safety/medical concerns         Walk 150 feet activity  Assist Walk 150 feet activity did not occur: Safety/medical concerns         Walk 10 feet on uneven surface  activity   Assist Walk 10 feet on uneven surfaces activity did not occur: Safety/medical concerns         Wheelchair     Assist Will patient use wheelchair at discharge?: Yes Type of Wheelchair: Manual    Wheelchair assist level: Supervision/Verbal cueing Max  wheelchair distance: 150'    Wheelchair 50 feet with 2 turns activity    Assist        Assist Level: Supervision/Verbal cueing   Wheelchair 150 feet activity     Assist      Assist Level: Supervision/Verbal cueing   Blood pressure 112/74, pulse 66, temperature 98.2 F (36.8 C), resp. rate 18, height 6\' 1"  (1.854 m), weight 91.4 kg, SpO2 99 %.  Medical Problem List and Plan: 1.  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  Status post negative TEE 01/10/2020  Continue CIR  -Continue PRAFO/WHO at HS    -KI for hamstring tone as needed      2.  Antithrombotics: -DVT/anticoagulation: Lovenox initiated 01/10/2020  -recent dopplers negative             -antiplatelet therapy: Aspirin 325 mg daily 3. Pain Management: Lidoderm patch, oxycodone 5 mg every 6 hours as needed  Controlled on 4/2 4. Mood: Advised emotional support  -neuropsych consult  -pt denies depression              -antipsychotic agents: N/A 5. Neuropsych: This patient is capable of making decisions on his own behalf. 6. Skin/Wound Care: Routine skin checks 7. Fluids/Electrolytes/Nutrition: encourage PO  -continue to monitor  BMP within normal limits on 3/25--recheck Monday.  8.  Dysphagia.  on regular/thins diet   Eating well 9.  HTN:  off BP meds - monitor   Relatively controlled 4/2 10.  Hyperlipidemia: Continue Lipitor 11.  History of tobacco/alcohol/marijuana use.  NicoDerm patch.  reviewed tobacco/drug use in regard to stroke risk 12. Spastic hemiparesis LLE  -fairly maintained at present  Baclofen 10mg  TID--   Tizanidine 2mg  TID  -orthotics, ROM  -need to make sure he can tolerate increase tizanidine dosing. Looks a little delayed this morning   LOS: 17 days A FACE TO FACE EVALUATION WAS PERFORMED  Meredith Staggers 02/01/2020, 10:32 AM

## 2020-02-01 NOTE — Progress Notes (Signed)
Physical Therapy Session Note  Patient Details  Name: Derek [name] MRN: [id number] Date of Birth: 1976/04/21  Today's Date: 02/01/2020 PT Individual Time: 0900-1010 PT Individual Time Calculation (min): 70 min   Short Term Goals: Week 3:  PT Short Term Goal 1 (Week 3): Pt will ambulated 30' w/ max assist w/ LRAD consistently PT Short Term Goal 2 (Week 3): Pt will transfer towards L side w/ mod assist PT Short Term Goal 3 (Week 3): Pt will demonstrate trace muscle activation in L quad consistently during functional mobility  Skilled Therapeutic Interventions/Progress Updates:   Pt received in w/c and agreeable to therapy, no c/o pain. Pt self-propelled w/c to/from therapy gym w/ supervision via R hemi technique. Session focused on LLE NMR w/ emphasis on L quad activation. Donned L GRAFO and ambulated 20' at rail w/ max assist overall however improved L quad activation in single leg stance, only needing mod-max assist to block and prevent buckling. Worked on sit<>stands at rail and lateral weight shifting and forward/backward stepping w/o UE support w/ max assist to block L knee during weight acceptance, however palpable L quad contraction felt ~50% of the time. Tactile and verbal cues throughout for quad activation. Performed NMES to L quad as detailed below w/ verbal and visual cues to kick ball in seated (performing LAQ motion). Pt able to kick ~50% of the time and encouraged him to maintain visualization of LLE moving for increased neuroplastic response. Pt very encouraged by this and educated him on benefits of visualizing the movement. Performed NuStep 5 min @ level 1 w/ BLEs only, tactile and verbal cues throughout for L quad activation. Returned to room and ended session in w/c, all needs in reach. Pt w/ increased L hamstring tone this AM. Therapist provided passive hamstring stretching during seated rest breaks in 20-30 sec bouts. Provided w/ hot packs to L distal hamstring at end of session.    Therapy Documentation Precautions:  Precautions Precautions: Fall Precaution Comments: L neglect, hemiparesis Required Braces or Orthoses: Other Brace Other Brace: L knee immobilizer Restrictions Weight Bearing Restrictions: No  Therapy/Group: Individual Therapy  Lakiya Cottam K Mehreen Azizi 02/01/2020, 10:14 AM

## 2020-02-02 ENCOUNTER — Inpatient Hospital Stay (HOSPITAL_COMMUNITY): Payer: BC Managed Care – PPO

## 2020-02-02 ENCOUNTER — Inpatient Hospital Stay (HOSPITAL_COMMUNITY): Payer: BC Managed Care – PPO | Admitting: Physical Therapy

## 2020-02-02 NOTE — Progress Notes (Signed)
Occupational Therapy Session Note  Patient Details  Name: Derek [name] MRN: [id number] Date of Birth: 1976-04-04  Today's Date: 02/02/2020 OT Individual Time: 1300-1345 OT Individual Time Calculation (min): 45 min    Short Term Goals: Week 3:  OT Short Term Goal 1 (Week 3): Pt will use LUE as stabilizer during grooming with Min multimodal cueing. OT Short Term Goal 2 (Week 3): Patient will don LB clothing with Mod A at sit to stand level. OT Short Term Goal 3 (Week 3): Pt will attend to left environment during grooming tasks with 2-3 cues. OT Short Term Goal 4 (Week 3): Patient will complete toilet transfer to [facility] with Mod A.  Skilled Therapeutic Interventions/Progress Updates:    1;1. Pt received in w/c requesting to work on RUE. Pt completes squat pivot transfers to EOM with MIN-MOD A and knee block. Pt completes total A UE ranger with scapular mobilization horixontal ab/adduct, pro/retraction, elbow flex/ext and shoulder flex/ext. Pt completes mirror therapy for RUE looking at reflection of RUE in mirror for NMR completeing elbow/wrist movements 1x25 in all planes for NMR. Exited session with pt seated in w/c, exit alarm on and saebow stim on L deltoid to approximate the shoulder joint and decrease risk of subluxation  Saebo Stim One 330 pulse width 35 Hz pulse rate On 8 sec/ off 8 sec Ramp up/ down 2 sec Symmetrical Biphasic wave form  Max intensity at 500 Ohm load Skin in tact at end of 60 min unattended  Therapy Documentation Precautions:  Precautions Precautions: Fall Precaution Comments: L neglect, hemiparesis Required Braces or Orthoses: Other Brace Other Brace: L knee immobilizer Restrictions Weight Bearing Restrictions: No General:   Vital Signs:   Pain:   ADL: ADL Grooming: Minimal assistance Where Assessed-Grooming: Sitting at sink Upper Body Bathing: Moderate assistance Where Assessed-Upper Body Bathing: Edge of bed Lower Body Bathing: Maximal  assistance Where Assessed-Lower Body Bathing: Edge of bed Upper Body Dressing: Moderate assistance Where Assessed-Upper Body Dressing: Edge of bed Lower Body Dressing: Maximal assistance Where Assessed-Lower Body Dressing: Edge of bed Toileting: Maximal assistance Where Assessed-Toileting: Teacher, adult education: Maximal Dentist Method: Surveyor, minerals: Tourist information centre manager    Praxis   Exercises:   Other Treatments:     Therapy/Group: Individual Therapy  Shon Hale 02/02/2020, 1:44 PM

## 2020-02-02 NOTE — Progress Notes (Signed)
[place] PHYSICAL MEDICINE & REHABILITATION PROGRESS NOTE   Subjective/Complaints: Slept well. Feels that spasms are better. Says he didn't feel to tired from recent med increases. Left shoulder sore from dangling at his side  ROS: Patient denies fever, rash, sore throat, blurred vision, nausea, vomiting, diarrhea, cough, shortness of breath or chest pain, joint or back pain, headache, or mood change.   Objective:   No results found. No results for input(s): WBC, HGB, HCT, PLT in the last 72 hours. No results for input(s): NA, K, CL, CO2, GLUCOSE, BUN, CREATININE, CALCIUM in the last 72 hours.  Intake/Output Summary (Last 24 hours) at 02/02/2020 1123 Last data filed at 02/02/2020 0730 Gross per 24 hour  Intake 510 ml  Output 1550 ml  Net -1040 ml     Physical Exam: Vital Signs Blood pressure 100/71, pulse 67, temperature 97.7 F (36.5 C), resp. rate 14, height 6\' 1"  (1.854 m), weight 91.4 kg, SpO2 97 %.  Constitutional: No distress . Vital signs reviewed. HEENT: EOMI, oral membranes moist Neck: supple Cardiovascular: RRR without murmur. No JVD    Respiratory/Chest: CTA Bilaterally without wheezes or rales. Normal effort    GI/Abdomen: BS +, non-tender, non-distended Ext: no clubbing, cyanosis, or edema Psych: pleasant but flat   Musc: No edema in extremities.  Left shoulder with 1cm sublux, tender with PROM. Neuro: Alert Motor: LUE/LLE: 0/5 proximal distal--see no movement today,  1-2/4 MAS left quads today, gastroc, 1/4 LUE--tone steady today RUE and RLE 5/5. Very strong on right   Assessment/Plan: 1. Functional deficits secondary to right MCA infarct which require 3+ hours per day of interdisciplinary therapy in a comprehensive inpatient rehab setting.  Physiatrist is providing close team supervision and 24 hour management of active medical problems listed below.  Physiatrist and rehab team continue to assess barriers to discharge/monitor patient progress toward  functional and medical goals  Care Tool:  Bathing    Body parts bathed by patient: Right arm, Left arm, Chest, Abdomen, Front perineal area, Right upper leg, Left upper leg, Left lower leg, Face, Right lower leg   Body parts bathed by helper: Buttocks     Bathing assist Assist Level: Moderate Assistance - Patient 50 - 74%     Upper Body Dressing/Undressing Upper body dressing   What is the patient wearing?: Pull over shirt    Upper body assist Assist Level: Minimal Assistance - Patient > 75%    Lower Body Dressing/Undressing Lower body dressing      What is the patient wearing?: Pants, Underwear/pull up     Lower body assist Assist for lower body dressing: Moderate Assistance - Patient 50 - 74%     Toileting Toileting    Toileting assist Assist for toileting: Moderate Assistance - Patient 50 - 74% Assistive Device Comment: BSC over toilet in bathroom   Transfers Chair/bed transfer  Transfers assist     Chair/bed transfer assist level: Moderate Assistance - Patient 50 - 74%     Locomotion Ambulation   Ambulation assist   Ambulation activity did not occur: Safety/medical concerns  Assist level: Maximal Assistance - Patient 25 - 49% Assistive device: Other (comment)(rail in hallway) Max distance: 20'   Walk 10 feet activity   Assist  Walk 10 feet activity did not occur: Safety/medical concerns  Assist level: Maximal Assistance - Patient 25 - 49% Assistive device: Other (comment)(rail in hallway)   Walk 50 feet activity   Assist Walk 50 feet with 2 turns activity did not occur:  Safety/medical concerns         Walk 150 feet activity   Assist Walk 150 feet activity did not occur: Safety/medical concerns         Walk 10 feet on uneven surface  activity   Assist Walk 10 feet on uneven surfaces activity did not occur: Safety/medical concerns         Wheelchair     Assist Will patient use wheelchair at discharge?: Yes Type of  Wheelchair: Manual    Wheelchair assist level: Supervision/Verbal cueing Max wheelchair distance: 150'    Wheelchair 50 feet with 2 turns activity    Assist        Assist Level: Supervision/Verbal cueing   Wheelchair 150 feet activity     Assist      Assist Level: Supervision/Verbal cueing   Blood pressure 100/71, pulse 67, temperature 97.7 F (36.5 C), resp. rate 14, height 6\' 1"  (1.854 m), weight 91.4 kg, SpO2 97 %.  Medical Problem List and Plan: 1.  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  Status post negative TEE 01/10/2020  Continue CIR  -Continue PRAFO/WHO at HS    -KI for hamstring tone as needed      2.  Antithrombotics: -DVT/anticoagulation: Lovenox initiated 01/10/2020  -recent dopplers negative             -antiplatelet therapy: Aspirin 325 mg daily 3. Pain Management: Lidoderm patch, oxycodone 5 mg every 6 hours as needed  Controlled on 4/3  -needs to keep hemiplegic shoulder elevated, supported while in bed/chair. 4. Mood: Advised emotional support  -neuropsych consult  -pt denies depression              -antipsychotic agents: N/A 5. Neuropsych: This patient is capable of making decisions on his own behalf. 6. Skin/Wound Care: Routine skin checks 7. Fluids/Electrolytes/Nutrition: encourage PO  -continue to monitor  BMP within normal limits on 3/25--recheck Monday.  8.  Dysphagia.  on regular/thins diet   Eating well 9.  HTN:  off BP meds - monitor   Controlled 4/3 10.  Hyperlipidemia: Continue Lipitor 11.  History of tobacco/alcohol/marijuana use.  NicoDerm patch.  reviewed tobacco/drug use in regard to stroke risk 12. Spastic hemiparesis LLE  -fairly maintained at present  Baclofen 10mg  TID--   Tizanidine 2mg  TID  -orthotics, ROM  -perhaps a little more subdued on meds above, but seems to be tolerating. Continue to observe.   -discussed outpt  botox with him today   LOS: 18 days A FACE TO [place] 02/02/2020, 11:23 AM

## 2020-02-02 NOTE — Progress Notes (Signed)
Physical Therapy Session Note  Patient Details  Name: Derek [name] MRN: [id number] Date of Birth: 1976-04-02  Today's Date: 02/02/2020 PT Individual Time: 0867-6195 PT Individual Time Calculation (min): 53 min   Short Term Goals: Week 3:  PT Short Term Goal 1 (Week 3): Pt will ambulated 30' w/ max assist w/ LRAD consistently PT Short Term Goal 2 (Week 3): Pt will transfer towards L side w/ mod assist PT Short Term Goal 3 (Week 3): Pt will demonstrate trace muscle activation in L quad consistently during functional mobility  Skilled Therapeutic Interventions/Progress Updates:   Pt received toileting in care of RN, agreeable to therapy and no c/o pain. Pt finished toileting and performed sit<>stand in stedy w/ supervision. Total assist for pericare and LE garment management. Stedy transfer to w/c. Donned shoes and LAFO total assist. Set-up assist to brush teeth and wash face at sink. Verbal cues for adaptive strategies to put toothpaste on toothbrush. Pt doffed and donned shirt w/ supervision and verbal cues for hemi technique. Pt briefly washed up underarms w/ total assist on R side. Pt self-propelled w/c to/from therapy gym. Worked on LLE NMR in standing at rail. Performed multiple bouts of standing w/ min-mod assist for balance while pt performed R forward and backward stepping w/ and w/o UE support, tactile, verbal, and manual cues for L quad activation during weight acceptance. Min manual facilitation of lateral weight shifting. Also performed L beach ball kicks in standing to work on L hip flexor activation. Strong kick each time w/ no trunk compensations, also performed w/ and w/o UE support on rail to challenge balance and postural control. Performed passive LLE stretching and gentle ROM for hamstring and quad tone management. Pt w/ notable decrease in HS tone in stance when stretching is performed prior. Returned to room via w/c, ended session in w/c and all needs in reach. Heat applied to L  upper trap and L distal hamstring, per pt's request for soreness relief.   Therapy Documentation Precautions:  Precautions Precautions: Fall Precaution Comments: L neglect, hemiparesis Required Braces or Orthoses: Other Brace Other Brace: L knee immobilizer Restrictions Weight Bearing Restrictions: No  Therapy/Group: Individual Therapy  Myna Freimark [name] 02/02/2020, 8:57 AM

## 2020-02-02 NOTE — Plan of Care (Signed)
  Problem: Consults Goal: RH STROKE PATIENT EDUCATION Description: See Patient Education module for education specifics  Outcome: Progressing   Problem: RH BOWEL ELIMINATION Goal: RH STG MANAGE BOWEL WITH ASSISTANCE Description: STG Manage Bowel with Assistance. Outcome: Progressing Goal: RH STG MANAGE BOWEL W/MEDICATION W/ASSISTANCE Description: STG Manage Bowel with Medication with Assistance. Outcome: Progressing   Problem: RH BLADDER ELIMINATION Goal: RH STG MANAGE BLADDER WITH ASSISTANCE Description: STG Manage Bladder With Assistance Outcome: Progressing Goal: RH STG MANAGE BLADDER WITH MEDICATION WITH ASSISTANCE Description: STG Manage Bladder With Medication With Assistance. Outcome: Progressing   Problem: RH SKIN INTEGRITY Goal: RH STG SKIN FREE OF INFECTION/BREAKDOWN Outcome: Progressing   Problem: RH SAFETY Goal: RH STG ADHERE TO SAFETY PRECAUTIONS W/ASSISTANCE/DEVICE Description: STG Adhere to Safety Precautions With Assistance/Device. Outcome: Progressing   Problem: RH PAIN MANAGEMENT Goal: RH STG PAIN MANAGED AT OR BELOW PT'S PAIN GOAL Outcome: Progressing   Problem: RH KNOWLEDGE DEFICIT Goal: RH STG INCREASE KNOWLEDGE OF DIABETES Outcome: Progressing Goal: RH STG INCREASE KNOWLEDGE OF HYPERTENSION Outcome: Progressing Goal: RH STG INCREASE KNOWLEDGE OF STROKE PROPHYLAXIS Outcome: Progressing   

## 2020-02-03 ENCOUNTER — Inpatient Hospital Stay (HOSPITAL_COMMUNITY): Payer: BC Managed Care – PPO | Admitting: Speech Pathology

## 2020-02-03 NOTE — Progress Notes (Signed)
Speech Language Pathology Daily Session Note  Patient Details  Name: Derek [name] MRN: [id number] Date of Birth: 1976-05-05  Today's Date: 02/03/2020 SLP Individual Time: 1040-1110 SLP Individual Time Calculation (min): 30 min  Short Term Goals: Week 3: SLP Short Term Goal 1 (Week 3): Patient will demonstrate selective attention to functional tasks for 45 minutes with Min verbal cues for redirection in a mildly distracting enviornment. SLP Short Term Goal 2 (Week 3): Patient will recall new, daily information with supervision verbal cues. SLP Short Term Goal 3 (Week 3): Patient will self-monitor and correct errors during functional tasks with supervision verbal cues. SLP Short Term Goal 4 (Week 3): Patient will demonstrate functional problem solving for mildly complex tasks with supervision verbal cues.  Skilled Therapeutic Interventions:  Pt was seen for skilled ST targeting cognitive goals.  Pt requested to transfer to wheelchair from bed for therapy session and was able to recall and direct therapist in the appropriate method of transferring.  SLP facilitated the session with a novel deductive reasoning puzzle to address goals for functional problem solving.  Pt needed overall min assist verbal cues for task organization as well as for recognizing and correcting errors.  Pt selectively attended to task for its duration (15 min) in a mildly distracting environment (TV left on with volume low) with no cues needed for redirection.  Pt requested additional worksheets to work on in between sessions at the end of today's therapy session.  Pt was left in wheelchair with chair alarm set and call bell within reach.  Continue per current plan of care.    Pain Pain Assessment Pain Scale: 0-10 Pain Score: 0-No pain  Therapy/Group: Individual Therapy  Derek [name], Melanee Spry 02/03/2020, 12:44 PM

## 2020-02-03 NOTE — Plan of Care (Signed)
  Problem: Consults Goal: RH STROKE PATIENT EDUCATION Description: See Patient Education module for education specifics  Outcome: Progressing   Problem: RH BOWEL ELIMINATION Goal: RH STG MANAGE BOWEL WITH ASSISTANCE Description: STG Manage Bowel with Assistance. Outcome: Progressing Goal: RH STG MANAGE BOWEL W/MEDICATION W/ASSISTANCE Description: STG Manage Bowel with Medication with Assistance. Outcome: Progressing   Problem: RH BLADDER ELIMINATION Goal: RH STG MANAGE BLADDER WITH ASSISTANCE Description: STG Manage Bladder With Assistance Outcome: Progressing Goal: RH STG MANAGE BLADDER WITH MEDICATION WITH ASSISTANCE Description: STG Manage Bladder With Medication With Assistance. Outcome: Progressing   Problem: RH SKIN INTEGRITY Goal: RH STG SKIN FREE OF INFECTION/BREAKDOWN Outcome: Progressing   Problem: RH SAFETY Goal: RH STG ADHERE TO SAFETY PRECAUTIONS W/ASSISTANCE/DEVICE Description: STG Adhere to Safety Precautions With Assistance/Device. Outcome: Progressing   Problem: RH PAIN MANAGEMENT Goal: RH STG PAIN MANAGED AT OR BELOW PT'S PAIN GOAL Outcome: Progressing   Problem: RH KNOWLEDGE DEFICIT Goal: RH STG INCREASE KNOWLEDGE OF DIABETES Outcome: Progressing Goal: RH STG INCREASE KNOWLEDGE OF HYPERTENSION Outcome: Progressing Goal: RH STG INCREASE KNOWLEDGE OF STROKE PROPHYLAXIS Outcome: Progressing

## 2020-02-04 ENCOUNTER — Inpatient Hospital Stay (HOSPITAL_COMMUNITY): Payer: BC Managed Care – PPO | Admitting: Speech Pathology

## 2020-02-04 ENCOUNTER — Inpatient Hospital Stay (HOSPITAL_COMMUNITY): Payer: BC Managed Care – PPO | Admitting: Physical Therapy

## 2020-02-04 ENCOUNTER — Inpatient Hospital Stay (HOSPITAL_COMMUNITY): Payer: BC Managed Care – PPO | Admitting: Occupational Therapy

## 2020-02-04 NOTE — Progress Notes (Signed)
Occupational Therapy Session Note  Patient Details  Name: Derek [name] MRN: [id number] Date of Birth: 09/15/1976  Today's Date: 02/04/2020 OT Individual Time: 1002-1055 OT Individual Time Calculation (min): 53 min    Short Term Goals: Week 3:  OT Short Term Goal 1 (Week 3): Pt will use LUE as stabilizer during grooming with Min multimodal cueing. OT Short Term Goal 2 (Week 3): Patient will don LB clothing with Mod A at sit to stand level. OT Short Term Goal 3 (Week 3): Pt will attend to left environment during grooming tasks with 2-3 cues. OT Short Term Goal 4 (Week 3): Patient will complete toilet transfer to [facility] with Mod A.  Skilled Therapeutic Interventions/Progress Updates:  Patient met lying supine in bed in agreement with OT treatment session. Skilled services provided with focus on self-care re-education and therapeutic activity. Supine to EOB and anterior scoot toward EOB with Min A. Patient able to complete sit to stand from wc to commode with Min-Mod A and complete toileting in standing with Mod A for clothing management and use of R grab bar. Stand-pivot transfer to [facility] in bathroom with use of R grab bar and Mod A. Patient able to bathe UB with Min A and LB with Mod A in sitting requiring assistance for maintaining figure-4 position to wash bilateral lower legs and feet. UB dressing seated at sink with visual input from mirror and Min A with Min cues for sequencing. LB dress with Mod A for threading LLE and to maintain standing balance while hiking pants over hips. Total assist and hand over hand to use LUE as a stabilizer on sink secondary to flaccidity. Patient completed grooming seated at sink level with Min A and 1-2 cues for attention to L. Session concluded with patient seated in wc with belt alarm activated, call bell within reach and all needs met.   Therapy Documentation Precautions:  Precautions Precautions: Fall Precaution Comments: L neglect, hemiparesis Required  Braces or Orthoses: Other Brace Other Brace: L knee immobilizer Restrictions Weight Bearing Restrictions: No   Therapy/Group: Individual Therapy  Zianne Schubring [name] 02/04/2020, 7:43 AM

## 2020-02-04 NOTE — Progress Notes (Addendum)
Physical Therapy Session Note  Patient Details  Name: Derek [name] MRN: [id number] Date of Birth: 10/28/1976  Today's Date: 02/04/2020 PT Individual Time: 1300-1410 PT Individual Time Calculation (min): 70 min   Short Term Goals: Week 3:  PT Short Term Goal 1 (Week 3): Pt will ambulated 30' w/ max assist w/ LRAD consistently PT Short Term Goal 2 (Week 3): Pt will transfer towards L side w/ mod assist PT Short Term Goal 3 (Week 3): Pt will demonstrate trace muscle activation in L quad consistently during functional mobility  Skilled Therapeutic Interventions/Progress Updates:   Pt in w/c and agreeable to therapy, no c/o pain. Pt self-propelled w/c to/from therapy gym and around unit w/ w/c via R hemi technique. LAFO donned Performed sit<>stands at rail w/ min assist and performed pre-gait tasks including R forward and backward stepping for L quad activation in SLS and L forward and backward stepping to work on L hip flexor activation and step length. Pt w/ improved L quad and hip flexor activation today. Ambulated 30' at rail w/ min assist for upright balance and lateral weight shifting at pelvis. Mod assist for L swing limb advancement and for L terminal knee extension. Worked on gait training w/ RW and LUE orthosis, LAFO and L bootie on foot to assist w/ foot clearance. Ambulated 25' x3 reps w/ max assist overall, same cues needed for LLE management, however mod-max assist for upright balance, lateral weight shifting and for RW management. Pt very encouraged by this. Performed kinetron in seated w/ BLEs to work on reciprocal movement pattern and LLE muscle activation. Performed 4 min x2 reps @ 70 cm/sec resistance, tactile and verbal cues intermittently for L hamstring and quad activation, however pt maintaining fluid and reciprocal movement pattern. Returned to room via w/c. Ended session in w/c, all needs in reach.   Unattended e-stim applied to L quad musculature at end of session. Pt  tolerated well and skin remained intact.  Saebo Stim One 330 pulse width 35 Hz pulse rate On 8 sec/ off 8 sec Ramp up/ down 2 sec Symmetrical Biphasic wave form  Max intensity at 500 Ohm load   Therapy Documentation Precautions:  Precautions Precautions: Fall Precaution Comments: L neglect, hemiparesis Required Braces or Orthoses: Other Brace Other Brace: L knee immobilizer Restrictions Weight Bearing Restrictions: No Vital Signs: Therapy Vitals Temp: 97.9 F (36.6 C) Pulse Rate: 80 Resp: 16 BP: 122/72 Patient Position (if appropriate): Sitting Oxygen Therapy SpO2: 99 % O2 Device: Room Air  Therapy/Group: Individual Therapy  Loreli Debruler [name] 02/04/2020, 2:13 PM

## 2020-02-04 NOTE — Progress Notes (Signed)
Social Work Patient ID: Derek [name], male   DOB: 10/08/1976, 44 y.o.   MRN: [id number]   SW followed up with pt s/o [name] [phone]) to discuss if she has spoken with pt about SSDI as he continues to worry about this and reports concerns to medical team. States she has been working on his SSDI and he has a phone interview on 4/12 at 11am. She intends to be here with him for this appointment. SW also inquired about states of ramp. States someone will come out to the home to measure ramp today. SW to follow-up with updates from team conference.   [name], MSW, LCSWA Office: [phone] Cell: [phone] Fax: [phone]

## 2020-02-04 NOTE — Plan of Care (Signed)
  Problem: Consults Goal: RH STROKE PATIENT EDUCATION Description: See Patient Education module for education specifics  Outcome: Progressing   Problem: RH BOWEL ELIMINATION Goal: RH STG MANAGE BOWEL WITH ASSISTANCE Description: STG Manage Bowel with min Assistance. Outcome: Progressing Goal: RH STG MANAGE BOWEL W/MEDICATION W/ASSISTANCE Description: STG Manage Bowel with Medication with min Assistance. Outcome: Progressing   Problem: RH BLADDER ELIMINATION Goal: RH STG MANAGE BLADDER WITH ASSISTANCE Description: STG Manage Bladder With min Assistance Outcome: Progressing Goal: RH STG MANAGE BLADDER WITH MEDICATION WITH ASSISTANCE Description: STG Manage Bladder With Medication With min Assistance. Outcome: Progressing   Problem: RH SKIN INTEGRITY Goal: RH STG SKIN FREE OF INFECTION/BREAKDOWN Description: Patients skin will remain free from further breakdown or infection with min assist. Outcome: Progressing   Problem: RH SAFETY Goal: RH STG ADHERE TO SAFETY PRECAUTIONS W/ASSISTANCE/DEVICE Description: STG Adhere to Safety Precautions With min Assistance/Device. Outcome: Progressing   Problem: RH PAIN MANAGEMENT Goal: RH STG PAIN MANAGED AT OR BELOW PT'S PAIN GOAL Description: < 4 Outcome: Progressing   Problem: RH KNOWLEDGE DEFICIT Goal: RH STG INCREASE KNOWLEDGE OF DIABETES Description: Patient/caregiver will verbalize understanding of DM management including monitoring, diet, exercise, medications, and follow up care with min assist. Outcome: Progressing Goal: RH STG INCREASE KNOWLEDGE OF HYPERTENSION Description: Patient/caregiver will verbalize understanding of HTN management including monitoring, diet, exercise, medications, and follow up care with min assist. Outcome: Progressing Goal: RH STG INCREASE KNOWLEDGE OF STROKE PROPHYLAXIS Description: Patient/caregiver will verbalize understanding of stroke management including monitoring, diet, exercise, medications, and  follow up care with min assist. Outcome: Progressing   

## 2020-02-04 NOTE — Progress Notes (Signed)
Speech Language Pathology Daily Session Note  Patient Details  Name: Derek [name] MRN: [id number] Date of Birth: 09-Jan-1976  Today's Date: 02/04/2020 SLP Individual Time: 0929-5747 SLP Individual Time Calculation (min): 55 min  Short Term Goals: Week 3: SLP Short Term Goal 1 (Week 3): Patient will demonstrate selective attention to functional tasks for 45 minutes with Min verbal cues for redirection in a mildly distracting enviornment. SLP Short Term Goal 2 (Week 3): Patient will recall new, daily information with supervision verbal cues. SLP Short Term Goal 3 (Week 3): Patient will self-monitor and correct errors during functional tasks with supervision verbal cues. SLP Short Term Goal 4 (Week 3): Patient will demonstrate functional problem solving for mildly complex tasks with supervision verbal cues.   Skilled Therapeutic Interventions: Skilled treatment session focused on cognitive goals. SLP facilitated session by providing Min A verbal cues for problem solving and organization during complex deductive reasoning tasks. Patient demonstrated selective attention to task for ~45 minutes with Mod I in a mildly distracting environment. Patient left upright in bed with alarm on and all needs within reach. Continue with current plan of care.      Pain Pain Assessment Pain Scale: 0-10 Pain Score: 0-No pain  Therapy/Group: Individual Therapy  Derek [name] 02/04/2020, 12:31 PM

## 2020-02-04 NOTE — Progress Notes (Signed)
[place] PHYSICAL MEDICINE & REHABILITATION PROGRESS NOTE   Subjective/Complaints: Feels well this morning. Would like to speak with SW regarding disability paperwork. Discussed with Auria who says she has already discussed this with him and he forgets about this; his significant other is taking care of the paperwork.  Spasms are well controlled.   ROS: Patient denies fever, rash, sore throat, blurred vision, nausea, vomiting, diarrhea, cough, shortness of breath or chest pain, joint or back pain, headache, or mood change.   Objective:   No results found. No results for input(s): WBC, HGB, HCT, PLT in the last 72 hours. No results for input(s): NA, K, CL, CO2, GLUCOSE, BUN, CREATININE, CALCIUM in the last 72 hours.  Intake/Output Summary (Last 24 hours) at 02/04/2020 1207 Last data filed at 02/04/2020 0851 Gross per 24 hour  Intake 800 ml  Output 1475 ml  Net -675 ml     Physical Exam: Vital Signs Blood pressure 126/82, pulse 78, temperature 98 F (36.7 C), resp. rate 19, height 6\' 1"  (1.854 m), weight 91.4 kg, SpO2 98 %.  Constitutional: No distress . Vital signs reviewed. Sitting up in bed comfortably.  HEENT: EOMI, oral membranes moist Neck: supple Cardiovascular: RRR without murmur. No JVD    Respiratory/Chest: CTA Bilaterally without wheezes or rales. Normal effort    GI/Abdomen: BS +, non-tender, non-distended Ext: no clubbing, cyanosis, or edema Psych: pleasant but flat Musc: No edema in extremities.  Left shoulder with 1cm sublux, tender with PROM. Neuro: Alert Motor: LUE/LLE: 0/5 proximal distal--see no movement today,  1-2/4 MAS left quads today, gastroc, 1/4 LUE--tone steady today RUE and RLE 5/5. Very strong on right   Assessment/Plan: 1. Functional deficits secondary to right MCA infarct which require 3+ hours per day of interdisciplinary therapy in a comprehensive inpatient rehab setting.  Physiatrist is providing close team supervision and 24 hour management  of active medical problems listed below.  Physiatrist and rehab team continue to assess barriers to discharge/monitor patient progress toward functional and medical goals  Care Tool:  Bathing    Body parts bathed by patient: Right arm, Left arm, Chest, Abdomen, Front perineal area, Right upper leg, Left upper leg, Left lower leg, Face, Right lower leg, Buttocks   Body parts bathed by helper: Buttocks     Bathing assist Assist Level: Moderate Assistance - Patient 50 - 74%     Upper Body Dressing/Undressing Upper body dressing   What is the patient wearing?: Pull over shirt    Upper body assist Assist Level: Minimal Assistance - Patient > 75%    Lower Body Dressing/Undressing Lower body dressing      What is the patient wearing?: Pants, Underwear/pull up     Lower body assist Assist for lower body dressing: Moderate Assistance - Patient 50 - 74%     Toileting Toileting    Toileting assist Assist for toileting: Moderate Assistance - Patient 50 - 74% Assistive Device Comment: In standing with use of urinal   Transfers Chair/bed transfer  Transfers assist     Chair/bed transfer assist level: Moderate Assistance - Patient 50 - 74%     Locomotion Ambulation   Ambulation assist   Ambulation activity did not occur: Safety/medical concerns  Assist level: Maximal Assistance - Patient 25 - 49% Assistive device: Other (comment)(rail in hallway) Max distance: 20'   Walk 10 feet activity   Assist  Walk 10 feet activity did not occur: Safety/medical concerns  Assist level: Maximal Assistance - Patient 25 -  49% Assistive device: Other (comment)(rail in hallway)   Walk 50 feet activity   Assist Walk 50 feet with 2 turns activity did not occur: Safety/medical concerns         Walk 150 feet activity   Assist Walk 150 feet activity did not occur: Safety/medical concerns         Walk 10 feet on uneven surface  activity   Assist Walk 10 feet on  uneven surfaces activity did not occur: Safety/medical concerns         Wheelchair     Assist Will patient use wheelchair at discharge?: Yes Type of Wheelchair: Manual    Wheelchair assist level: Supervision/Verbal cueing Max wheelchair distance: 150'    Wheelchair 50 feet with 2 turns activity    Assist        Assist Level: Supervision/Verbal cueing   Wheelchair 150 feet activity     Assist      Assist Level: Supervision/Verbal cueing   Blood pressure 126/82, pulse 78, temperature 98 F (36.7 C), resp. rate 19, height 6\' 1"  (1.854 m), weight 91.4 kg, SpO2 98 %.  Medical Problem List and Plan: 1.  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  Status post negative TEE 01/10/2020  Continue CIR  -Continue PRAFO/WHO at HS    -KI for hamstring tone as needed 2.  Antithrombotics: -DVT/anticoagulation: Lovenox initiated 01/10/2020  -recent dopplers negative             -antiplatelet therapy: Aspirin 325 mg daily 3. Pain Management: Lidoderm patch, oxycodone 5 mg every 6 hours as needed  Controlled on 4/5  -needs to keep hemiplegic shoulder elevated, supported while in bed/chair. 4. Mood: Advised emotional support  -neuropsych consult  -pt denies depression              -antipsychotic agents: N/A 5. Neuropsych: This patient is capable of making decisions on his own behalf. 6. Skin/Wound Care: Routine skin checks 7. Fluids/Electrolytes/Nutrition: encourage PO  -continue to monitor  BMP within normal limits on 3/25--recheck Monday.  8.  Dysphagia.  on regular/thins diet   Eating well 9.  HTN:  off BP meds - monitor   Controlled 4/5 10.  Hyperlipidemia: Continue Lipitor 11.  History of tobacco/alcohol/marijuana use.  NicoDerm patch.  reviewed tobacco/drug use in regard to stroke risk 12. Spastic hemiparesis LLE  -fairly maintained at present  Baclofen 10mg  TID--    Tizanidine 2mg  TID  -orthotics, ROM  -perhaps a little more subdued on meds above, but seems to be tolerating. Continue to observe.   4/5: spasms well controlled.   -discussed outpt botox with him today   LOS: 20 days A FACE TO FACE EVALUATION WAS PERFORMED  Martha Clan P Michi Herrmann 02/04/2020, 12:07 PM

## 2020-02-05 ENCOUNTER — Inpatient Hospital Stay (HOSPITAL_COMMUNITY): Payer: BC Managed Care – PPO | Admitting: Physical Therapy

## 2020-02-05 ENCOUNTER — Inpatient Hospital Stay (HOSPITAL_COMMUNITY): Payer: BC Managed Care – PPO | Admitting: Occupational Therapy

## 2020-02-05 ENCOUNTER — Inpatient Hospital Stay (HOSPITAL_COMMUNITY): Payer: BC Managed Care – PPO | Admitting: Speech Pathology

## 2020-02-05 MED ORDER — TRAZODONE HCL 50 MG PO TABS: 50.0000 mg | ORAL_TABLET | Freq: Every evening | ORAL | Status: DC | PRN

## 2020-02-05 NOTE — Progress Notes (Signed)
Occupational Therapy Session Note  Patient Details  Name: Derek [name] MRN: [id number] Date of Birth: 1976/03/21  Today's Date: 02/05/2020 OT Individual Time: 7473-4037 OT Individual Time Calculation (min): 41 min    OT Individual Time: 0964-3838 OT Individual Time Calculation (min): 53 min   Short Term Goals: Week 3:  OT Short Term Goal 1 (Week 3): Pt will use LUE as stabilizer during grooming with Min multimodal cueing. OT Short Term Goal 2 (Week 3): Patient will don LB clothing with Mod A at sit to stand level. OT Short Term Goal 3 (Week 3): Pt will attend to left environment during grooming tasks with 2-3 cues. OT Short Term Goal 4 (Week 3): Patient will complete toilet transfer to [facility] with Mod A.  Skilled Therapeutic Interventions/Progress Updates:  Session 1: Patient met seated in wc in agreement with OT treatment session. 0/10 pain at rest and with activity. Skilled OT services provided with focus on self-care re-education as detailed below. Patient able to self-propel wc to sink level in prep for oral hygiene/grooming tasks. Patient completed face washing and shaving with set-up assist and good attention to L environment throughout with Minimal cueing. Patient able to doff/don UB clothing seated at sink level with Min A and doff/don LB clothing with Min A in sitting/standing. Patient demonstrates increased hip flexibility requiring less assistance to maintain figure-4 position. Session concluded with patient seated in wc with belt alarm activated, call bell within reach, and Saebo applied to LUE at deltoid to approximate the shoulder joint and decrease risk of subluxation. Saebo removed after 1 hour unattended. No s/s of skin breakdown.  Saebo Stim One 330 pulse width 35 Hz pulse rate On 8 sec/ off 8 sec Ramp up/ down 2 sec Symmetrical Biphasic wave form  Max intensity 14m at 500 Ohm load Skin in tact at end of 60 min unattended  Session 2: Patient met seated in wc in  agreement with OT treatment session. Wc mobility in community through doorways, around obstacles, and in elevators with supervision A. MNR with mirror box therapy using non-affected RUE with focus on improvement of motor function in prep for ADLs, nerve pain reduction, reduction of L neglect, and improvement of sensory impairmentwith wrist flexion/extension, flexion/extension of digits,finger opposition,and grasp/release of foam blockswith non-affected UE. Self-ROM 2 set x 10 reps each to facilitate soft tissue elongation and reduce risk of contractures. Patient transported back to room with total A for time management. Session concluded with patient seated in wc with call bell within reach, belt alarm activated, and all needs met.   Therapy Documentation Precautions:  Precautions Precautions: Fall Precaution Comments: L neglect, hemiparesis Required Braces or Orthoses: Other Brace Other Brace: L knee immobilizer Restrictions Weight Bearing Restrictions: No  Therapy/Group: Individual Therapy  Fenix Ruppe [name] 02/05/2020, 7:38 AM

## 2020-02-05 NOTE — Progress Notes (Addendum)
[place] PHYSICAL MEDICINE & REHABILITATION PROGRESS NOTE   Subjective/Complaints: Apparently didn't sleep too well last night (ongoing). Spasticity is better. kpad helped left shoulder  ROS: Patient denies fever, rash, sore throat, blurred vision, nausea, vomiting, diarrhea, cough, shortness of breath or chest pain,  headache, or mood change.    Objective:   No results found. No results for input(s): WBC, HGB, HCT, PLT in the last 72 hours. No results for input(s): NA, K, CL, CO2, GLUCOSE, BUN, CREATININE, CALCIUM in the last 72 hours.  Intake/Output Summary (Last 24 hours) at 02/05/2020 1246 Last data filed at 02/05/2020 0845 Gross per 24 hour  Intake 780 ml  Output 1400 ml  Net -620 ml     Physical Exam: Vital Signs Blood pressure 102/64, pulse 80, temperature 98.1 F (36.7 C), resp. rate 18, height 6\' 1"  (1.854 m), weight 91.4 kg, SpO2 96 %.  Constitutional: No distress . Vital signs reviewed. HEENT: EOMI, oral membranes moist Neck: supple Cardiovascular: RRR without murmur. No JVD    Respiratory/Chest: CTA Bilaterally without wheezes or rales. Normal effort    GI/Abdomen: BS +, non-tender, non-distended Ext: no clubbing, cyanosis, or edema Psych: pleasant and cooperative Musc: No edema in extremities.  Left shoulder with 1cm sublux,less tender with PROM. Neuro: Alert Motor: LUE/LLE: 0/5 proximal distal--see no movement today,  1/4 MAS left quads today, gastroc, 1/4 LUE RUE and RLE 5/5.    Assessment/Plan: 1. Functional deficits secondary to right MCA infarct which require 3+ hours per day of interdisciplinary therapy in a comprehensive inpatient rehab setting.  Physiatrist is providing close team supervision and 24 hour management of active medical problems listed below.  Physiatrist and rehab team continue to assess barriers to discharge/monitor patient progress toward functional and medical goals  Care Tool:  Bathing    Body parts bathed by patient: Right arm,  Left arm, Chest, Abdomen, Front perineal area, Right upper leg, Left upper leg, Left lower leg, Face, Right lower leg, Buttocks   Body parts bathed by helper: Buttocks     Bathing assist Assist Level: Moderate Assistance - Patient 50 - 74%     Upper Body Dressing/Undressing Upper body dressing   What is the patient wearing?: Pull over shirt    Upper body assist Assist Level: Minimal Assistance - Patient > 75%    Lower Body Dressing/Undressing Lower body dressing      What is the patient wearing?: Pants, Underwear/pull up     Lower body assist Assist for lower body dressing: Minimal Assistance - Patient > 75%     Toileting Toileting    Toileting assist Assist for toileting: Moderate Assistance - Patient 50 - 74% Assistive Device Comment: In standing with use of urinal   Transfers Chair/bed transfer  Transfers assist     Chair/bed transfer assist level: Minimal Assistance - Patient > 75%     Locomotion Ambulation   Ambulation assist   Ambulation activity did not occur: Safety/medical concerns  Assist level: Maximal Assistance - Patient 25 - 49% Assistive device: Walker-rolling Max distance: 25'   Walk 10 feet activity   Assist  Walk 10 feet activity did not occur: Safety/medical concerns  Assist level: Maximal Assistance - Patient 25 - 49% Assistive device: Walker-rolling   Walk 50 feet activity   Assist Walk 50 feet with 2 turns activity did not occur: Safety/medical concerns         Walk 150 feet activity   Assist Walk 150 feet activity did not occur: Safety/medical  concerns         Walk 10 feet on uneven surface  activity   Assist Walk 10 feet on uneven surfaces activity did not occur: Safety/medical concerns         Wheelchair     Assist Will patient use wheelchair at discharge?: Yes Type of Wheelchair: Manual    Wheelchair assist level: Supervision/Verbal cueing Max wheelchair distance: 150'    Wheelchair 50 feet  with 2 turns activity    Assist        Assist Level: Supervision/Verbal cueing   Wheelchair 150 feet activity     Assist      Assist Level: Supervision/Verbal cueing   Blood pressure 102/64, pulse 80, temperature 98.1 F (36.7 C), resp. rate 18, height 6\' 1"  (1.854 m), weight 91.4 kg, SpO2 96 %.  Medical Problem List and Plan: 1.  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  Status post negative TEE 01/10/2020  Continue CIR  -Continue PRAFO/WHO at HS    -KI for hamstring tone as needed    -pt with left HF, ?quads in standing. Used shelf AFO successfully yesterday with PT. Will order custom AFO per [facility].  2.  Antithrombotics: -DVT/anticoagulation: Lovenox initiated 01/10/2020  -recent dopplers negative             -antiplatelet therapy: Aspirin 325 mg daily 3. Pain Management: Lidoderm patch, oxycodone 5 mg every 6 hours as needed  -kpad  Controlled on 4/6  -needs to keep hemiplegic shoulder elevated, supported while in bed/chair. 4. Mood: Advised emotional support  -neuropsych consult  -pt denies depression              -antipsychotic agents: N/A 5. Neuropsych: This patient is capable of making decisions on his own behalf. 6. Skin/Wound Care: Routine skin checks 7. Fluids/Electrolytes/Nutrition: encourage PO  -continue to monitor  BMP within normal limits on 3/25--recheck labs tomorrow 4/7 8.  Dysphagia.  on regular/thins diet   Eating well 9.  HTN:  off BP meds - monitor   Controlled 4/3 10.  Hyperlipidemia: Continue Lipitor 11.  History of tobacco/alcohol/marijuana use.  NicoDerm patch.  reviewed tobacco/drug use in regard to stroke risk 12. Spastic hemiparesis LLE  -fairly maintained at present  Baclofen 10mg  TID--   Tizanidine 2mg  TID  -orthotics, ROM  -perhaps a little more subdued on meds above, but seems to be tolerating. Continue to observe.   -have discussed  outpt botox with him    LOS: 21 days A FACE TO FACE EVALUATION WAS PERFORMED  6/7 02/05/2020, 12:46 PM

## 2020-02-05 NOTE — Progress Notes (Signed)
Speech Language Pathology Daily Session Note  Patient Details  Name: DIMETRI ARMITAGE MRN: [id number] Date of Birth: 1976/09/28  Today's Date: 02/05/2020 SLP Individual Time: 1415-1455 SLP Individual Time Calculation (min): 40 min  Short Term Goals: Week 3: SLP Short Term Goal 1 (Week 3): Patient will demonstrate selective attention to functional tasks for 45 minutes with Min verbal cues for redirection in a mildly distracting enviornment. SLP Short Term Goal 2 (Week 3): Patient will recall new, daily information with supervision verbal cues. SLP Short Term Goal 3 (Week 3): Patient will self-monitor and correct errors during functional tasks with supervision verbal cues. SLP Short Term Goal 4 (Week 3): Patient will demonstrate functional problem solving for mildly complex tasks with supervision verbal cues.  Skilled Therapeutic Interventions: Skilled treatment session focused on cognitive goals. SLP facilitated session by providing Mod A verbal cues for problem solving and sequencing during a complex deductive reasoning task. Patient demonstrated selective attention to task in a moderately distracting environment for ~30 minutes. Patient independently recalled events from previous therapy sessions with Mod I. Patient left upright in the wheelchair with alarm on and all needs within reach. Continue with current plan of care.      Pain No/Denies Pain   Therapy/Group: Individual Therapy  Shatima Zalar 02/05/2020, 3:10 PM

## 2020-02-05 NOTE — Progress Notes (Signed)
Occupational Therapy Weekly Progress Note  Patient Details  Name: Derek [name] MRN: [id number] Date of Birth: 06/18/1976  Beginning of progress report period: January 29, 2020 End of progress report period: February 06, 2020  Today's Date: 02/06/2020 OT Individual Time: 5329-9242 OT Individual Time Calculation (min): 71 min    Patient has met 2 of 4 short term goals. Patient able to don UB clothing with S-Min A with ability to recall hemi-dressing techniques. Patient requires Mod A for LB dressing sitting/standing at sink level. Patient continues to demonstrate dense LUE flaccidity with no palpable activation making use of LUE as a stabilizer during grooming tasks a hardship. Increased awareness of L environment and LUE/LLE with self-care tasks and functional transfers requiring fewer cues. Stand-pivot transfers to [facility] over standard toilet with Mod A and use of grab bar and compensatory techniques.    Patient continues to demonstrate the following deficits:muscle weakness,decreased cardiorespiratoy endurance,impaired timing and sequencing, abnormal tone, unbalanced muscle activation, ataxia, decreased coordination and decreased motor planning, decreased attention although much improved since initial eval, decreased safety awareness requiring cues locking/unlocking breaks on wc, and delayed processingand therefore will continue to benefit from skilled OT intervention to enhance overall performance with BADL and reduce care partner burden.  Patient progressing toward long term goals..  Continue plan of care.  OT Short Term Goals Week 3:  OT Short Term Goal 1 (Week 3): Pt will use LUE as stabilizer during grooming with Min multimodal cueing. OT Short Term Goal 1 - Progress (Week 3): Progressing toward goal OT Short Term Goal 2 (Week 3): Patient will don LB clothing with Mod A at sit to stand level. OT Short Term Goal 2 - Progress (Week 3): Met OT Short Term Goal 3 (Week 3): Pt will attend to left  environment during grooming tasks with 2-3 cues. OT Short Term Goal 3 - Progress (Week 3): Met OT Short Term Goal 4 (Week 3): Patient will complete toilet transfer to [facility] with Mod A. OT Short Term Goal 4 - Progress (Week 3): Met Week 4:  OT Short Term Goal 1 (Week 4): LTG = STG 2/2 ELOS.    Skilled Therapeutic Interventions/Progress Updates:  Patient met lying supine in bed in agreement with OT treatment session. Skilled OT services provided with focus on self-care re-education and therapeutic activity as detailed below. Supine to EOB with Min A and squat-pivot transfer to wc with Min A. Patient completed toilet transfer and toileting/hygiene/clothing management with Mod A for hygiene/clothing management in standing. Patient demonstrates good compensatory techniques to maintain standing balance with use of grab bar and weight shifting on LLE in standing. Education on use of TTB for bathing tasks to increase independence, decrease need for assistance and decrease risk of falls in prep for d/c home. Wc to TTB tranfer via stand-pivot with Mod A with use of grab bar on R. UB/LB bath with Min-Mod A. Patient completed UB dressing seated at sink with visual input from mirror with supervision A and LB dress with Min-Mod A and cues for weight shifting in standing. Session concluded with patient seated in wc with call bell within reach, belt alarm activated and all needs met.    Therapy Documentation Precautions:  Precautions Precautions: Fall Precaution Comments: L neglect, hemiparesis Required Braces or Orthoses: Other Brace Other Brace: L knee immobilizer Restrictions Weight Bearing Restrictions: No    Therapy/Group: Individual Therapy  Tylerjames Hoglund [name] 02/05/2020, 12:48 PM

## 2020-02-05 NOTE — Progress Notes (Signed)
Orthopedic Tech Progress Note Patient Details:  Derek [name] April 22, 1976 [id number] Called in order to [facility] for an AFO CONSULT  Patient ID: Derek [name], male   DOB: 03-Jun-1976, 44 y.o.   MRN: [id number]   [name] 02/05/2020, 10:17 AM

## 2020-02-05 NOTE — Patient Care Conference (Signed)
Inpatient RehabilitationTeam Conference and Plan of Care Update Date: 02/05/2020   Time: 10:10 AM    Patient Name: Derek [name]      Medical Record Number: [id number]  Date of Birth: 1975-11-29 Sex: Male         Room/Bed: 4W15C/4W15C-01 Payor Info: Payor: BLUE CROSS BLUE SHIELD / Plan: BCBS COMM PPO / Product Type: *No Product type* /    Admit Date/Time:  01/15/2020  6:30 PM  Primary Diagnosis:  Right middle cerebral artery stroke [facility])  Patient Active Problem List   Diagnosis Date Noted  . Pain   . Spastic hemiparesis (HCC)   . History of hypertension   . Dyslipidemia   . 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 Leukocytosis 01/12/2020  . Cerebrovascular accident (CVA) due to thrombosis of right middle cerebral artery (HCC)   . Chronic pain syndrome   . Hypokalemia   . Dysphagia, post-stroke   . 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 Tobacco dependence   . Marijuana dependence (HCC)   . Alcohol dependence [facility])     Expected Discharge Date: Expected Discharge Date: 02/13/20  Team Members Present: Physician leading conference: Dr. Faith Rogue Care Coodinator Present: [name], LCSWA;Genie Delaynee Alred, RN, MSN Nurse Present: Doran Durand, LPN PT Present: Carlynn Purl, PT OT Present: Lina Sayre, OT SLP Present: Feliberto Gottron, SLP PPS Coordinator present : Edson Snowball, PT     Current Status/Progress Goal Weekly Team Focus  Bowel/Bladder   Pt is continent od B/B,LBM 4/4  remain continent of b/b  Assist with toileting as needed   Swallow/Nutrition/ Hydration             ADL's   Patient currently functioning at supervision A to Min A for UB bath/dress, Mod A for LB bath seated in shower and Mod A for LB dressing sitting/standing at sink level. Mod A toileting with increaed static standing balance with good compensation and use of grab bar. Continued dense hemiplegia with no activation noted in LUE. Min A for sit to stand  and Min-Mod for squat-pivot transfers to commode/shower.  MIN A-S overall  NMR, BADL retraining, WB strategies, L attention/UE management, functional transfer training, family education.   Mobility   very trace LLE activation (hip>knee), min-mod assist transfers, max assist gait at rail or in litegait, supervision w/c mobility  min assist transfers, mod assist gait  LLE NMR, progressing gait, consistency w/ transfers, family education and d/c planning   Communication             Safety/Cognition/ Behavioral Observations  Min-Mod A  Min A  complex problem solving, recall with use of strategies   Pain   pt c/o Lshoulder pain/discomfort, PRN Oxy effective  remain pain free  Assess pain Qshift/PRN   Skin   skin intact, no obvious signs of infection or breakdown  maintain skin integrity  Assess skin Qshift/PRN    Rehab Goals Patient on target to meet rehab goals: Yes *See Care Plan and progress notes for long and short-term goals.     Barriers to Discharge  Current Status/Progress Possible Resolutions Date Resolved   Nursing                  PT                    OT                  SLP  SW Other (comments) Uninsured. SSI phont interview on 4/12 at 11am.            Discharge Planning/Teaching Needs:  D/c to home with s/o [name] who will provide assistance to pt. Pt also states his 66 y.o,. son can help him as well. S/O [name] will have home measured for ramp today.  Family education as recommended by therapy.   Team Discussion: MD working on spasticity, BP better.  RN cont B/B, not sleeping well, restless, Kpad to shoulder for pain.  OT min UB B/D, mod LB B/D, min/mod transfers.  PT min/mod transfers, max A gait, amb walker max A, AFO consult tomorrow, goals S/min A.  SLP memory improving, prob solving improving.  Need fam ed end of this week.   Revisions to Treatment Plan: N/A     Medical Summary Current Status: ongoing dense left hemiparesis, tone better  controlled. left shoulder pain better with kpad Weekly Focus/Goal: improve use of left arm/leg, tone mgt, pain mgt  Barriers to Discharge: Medical stability       Continued Need for Acute Rehabilitation Level of Care: The patient requires daily medical management by a physician with specialized training in physical medicine and rehabilitation for the following reasons: Direction of a multidisciplinary physical rehabilitation program to maximize functional independence : Yes Medical management of patient stability for increased activity during participation in an intensive rehabilitation regime.: Yes Analysis of laboratory values and/or radiology reports with any subsequent need for medication adjustment and/or medical intervention. : Yes   I attest that I was present, lead the team conference, and concur with the assessment and plan of the team.   Retta Diones 02/06/2020, 3:44 PM   Team conference was held via web/ teleconference due to [place] - 19

## 2020-02-05 NOTE — Progress Notes (Signed)
Physical Therapy Session Note  Patient Details  Name: Derek [name] MRN: [id number] Date of Birth: 1976/08/19  Today's Date: 02/05/2020 PT Individual Time: 1300-1340 PT Individual Time Calculation (min): 40 min   Short Term Goals: Week 3:  PT Short Term Goal 1 (Week 3): Pt will ambulated 30' w/ max assist w/ LRAD consistently PT Short Term Goal 2 (Week 3): Pt will transfer towards L side w/ mod assist PT Short Term Goal 3 (Week 3): Pt will demonstrate trace muscle activation in L quad consistently during functional mobility  Skilled Therapeutic Interventions/Progress Updates:   Pt received in w/c and agreeable to therapy, no c/o pain. Pt self-propelled w/c to/from therapy gym w/ supervision via R hemi technique. Session focused on pre-gait and gait training. Therapist performed PROM and stretching at end range of knee extension/flexion and ankle DF for tone management in preparation for gait. Sit<>stands to RW w/ min assist and ambulated in bouts of 15-20' w/ mod-max assist overall. Mod assist for LLE management, needs tactile and verbal cues for increased L hip flexion w/ each step and min-mod manual assist to achieve L knee extension in terminal stance. Practiced ambulating w/ and w/o L ground reaction AFO in L shoe w/ orthotist present to assess AFO use. Pt some benefit w/ anterior support of AFO to prevent buckling, however pt w/ adequate LLE DF to clear foot w/ toe cap today. Pt w/ no volitional activation of L DF at rest, suspect pt w/ flexor synergy assisting w/ L foot clearance w/ swing limb advancement. Orthotist to add permanent toe cap to L shoe, will continue to assess for appropriate AFO use over next few treatment sessions. Returned to room and ended session in w/c, all needs in reach. Hot pack applied to distal posterior thigh for tightness relief.   Therapy Documentation Precautions:  Precautions Precautions: Fall Precaution Comments: L neglect, hemiparesis Required Braces or  Orthoses: Other Brace Other Brace: L knee immobilizer Restrictions Weight Bearing Restrictions: No  Therapy/Group: Individual Therapy  Rhett Najera [name] 02/05/2020, 2:37 PM

## 2020-02-06 ENCOUNTER — Inpatient Hospital Stay (HOSPITAL_COMMUNITY): Payer: BC Managed Care – PPO | Admitting: Speech Pathology

## 2020-02-06 ENCOUNTER — Inpatient Hospital Stay (HOSPITAL_COMMUNITY): Payer: BC Managed Care – PPO | Admitting: Occupational Therapy

## 2020-02-06 ENCOUNTER — Inpatient Hospital Stay (HOSPITAL_COMMUNITY): Payer: BC Managed Care – PPO | Admitting: *Deleted

## 2020-02-06 ENCOUNTER — Inpatient Hospital Stay (HOSPITAL_COMMUNITY): Payer: BC Managed Care – PPO | Admitting: Physical Therapy

## 2020-02-06 LAB — CBC
HCT: 40.7 % (ref 39.0–52.0)
Hemoglobin: 13.5 g/dL (ref 13.0–17.0)
MCH: 29.3 pg (ref 26.0–34.0)
MCHC: 33.2 g/dL (ref 30.0–36.0)
MCV: 88.5 fL (ref 80.0–100.0)
Platelets: 235 10*3/uL (ref 150–400)
RBC: 4.6 MIL/uL (ref 4.22–5.81)
RDW: 16.8 % — ABNORMAL HIGH (ref 11.5–15.5)
WBC: 5.7 10*3/uL (ref 4.0–10.5)
nRBC: 0 % (ref 0.0–0.2)

## 2020-02-06 LAB — BASIC METABOLIC PANEL
Anion gap: 9 (ref 5–15)
BUN: 10 mg/dL (ref 6–20)
CO2: 24 mmol/L (ref 22–32)
Calcium: 9.4 mg/dL (ref 8.9–10.3)
Chloride: 109 mmol/L (ref 98–111)
Creatinine, Ser: 0.93 mg/dL (ref 0.61–1.24)
GFR calc Af Amer: >60 mL/min (ref >60)
GFR calc non Af Amer: >60 mL/min (ref >60)
Glucose, Bld: 102 mg/dL — ABNORMAL HIGH (ref 70–99)
Potassium: 4.1 mmol/L (ref 3.5–5.1)
Sodium: 142 mmol/L (ref 135–145)

## 2020-02-06 NOTE — Progress Notes (Signed)
Speech Language Pathology Weekly Progress and Session Note  Patient Details  Name: Derek [name] MRN: [id number] Date of Birth: [birth year]  Beginning of progress report period: January 30, 2020 End of progress report period: February 06, 2020  Today's Date: 02/06/2020 SLP Individual Time: 1000-1055 SLP Individual Time Calculation (min): 55 min  Short Term Goals: Week 3: SLP Short Term Goal 1 (Week 3): Patient will demonstrate selective attention to functional tasks for 45 minutes with Min verbal cues for redirection in a mildly distracting enviornment. SLP Short Term Goal 1 - Progress (Week 3): Met SLP Short Term Goal 2 (Week 3): Patient will recall new, daily information with supervision verbal cues. SLP Short Term Goal 2 - Progress (Week 3): Met SLP Short Term Goal 3 (Week 3): Patient will self-monitor and correct errors during functional tasks with supervision verbal cues. SLP Short Term Goal 3 - Progress (Week 3): Not met SLP Short Term Goal 4 (Week 3): Patient will demonstrate functional problem solving for mildly complex tasks with supervision verbal cues. SLP Short Term Goal 4 - Progress (Week 3): Not met    New Short Term Goals: Week 4: SLP Short Term Goal 1 (Week 4): STGs=LTGs due to ELOS  Weekly Progress Updates: Patient continues to make functional gains and has met 2 of 4 STGs this reporting period. Currently, patient requires overall supervision-Min A verbal cues to complete functional and familiar tasks safely in regards to selective attention, recall of functional information, complex problem solving and error awareness. Patient and family education is ongoing. Patient would benefit from continued skilled SLP intervention to maximize his cognitive functioning and overall functional independence prior to discharge.      Intensity: Minumum of 1-2 x/day, 30 to 90 minutes Frequency: 3 to 5 out of 7 days Duration/Length of Stay: 4/14 Treatment/Interventions: Cognitive  remediation/compensation;Dysphagia/aspiration precaution training;Internal/external aids;Therapeutic Activities;Environmental controls;Cueing hierarchy;Functional tasks;Multimodal communication approach;Patient/family education   Daily Session  Skilled Therapeutic Interventions: Skilled treatment session focused on cognitive goals. SLP facilitated session by providing extra time and Min verbal cues for complex problem solving and error awareness during complex reasoning tasks. Patient requested assistance on donning his leg rest, therefore, SLP educated patient on how to complete task independently. Patient able to verbalize and demonstrate understanding with supervision verbal cues. Patient left upright in the wheelchair with alarm on and all needs within reach. Continue with current plan of care.      Pain No/Denies Pain   Therapy/Group: Individual Therapy  Joeleen Wortley 02/06/2020, 6:39 AM

## 2020-02-06 NOTE — Progress Notes (Signed)
[place] PHYSICAL MEDICINE & REHABILITATION PROGRESS NOTE   Subjective/Complaints: Feeling well today. Continues to have some left shoulder pain. Labs stable. Has discussed disability forms with SW and is content with plan.   ROS: Patient denies fever, rash, sore throat, blurred vision, nausea, vomiting, diarrhea, cough, shortness of breath or chest pain,  headache, or mood change.    Objective:   No results found. Recent Labs    02/06/20 0444  WBC 5.7  HGB 13.5  HCT 40.7  PLT 235   Recent Labs    02/06/20 0444  NA 142  K 4.1  CL 109  CO2 24  GLUCOSE 102*  BUN 10  CREATININE 0.93  CALCIUM 9.4    Intake/Output Summary (Last 24 hours) at 02/06/2020 1721 Last data filed at 02/06/2020 1300 Gross per 24 hour  Intake 300 ml  Output 2 ml  Net 298 ml     Physical Exam: Vital Signs Blood pressure 118/87, pulse 77, temperature 98.2 F (36.8 C), resp. rate 18, height 6\' 1"  (1.854 m), weight 91.4 kg, SpO2 98 %.  Constitutional: No distress . Vital signs reviewed. Lying comfortably in bed.  HEENT: EOMI, oral membranes moist Neck: supple Cardiovascular: RRR without murmur. No JVD    Respiratory/Chest: CTA Bilaterally without wheezes or rales. Normal effort    GI/Abdomen: BS +, non-tender, non-distended Ext: no clubbing, cyanosis, or edema Psych: pleasant and cooperative Musc: No edema in extremities.  Left shoulder with 1cm sublux,less tender with PROM. Neuro: Alert Motor: LUE/LLE: 0/5 proximal distal--see no movement today,  1/4 MAS left quads today, gastroc, 1/4 LUE RUE and RLE 5/5.    Assessment/Plan: 1. Functional deficits secondary to right MCA infarct which require 3+ hours per day of interdisciplinary therapy in a comprehensive inpatient rehab setting.  Physiatrist is providing close team supervision and 24 hour management of active medical problems listed below.  Physiatrist and rehab team continue to assess barriers to discharge/monitor patient progress  toward functional and medical goals  Care Tool:  Bathing    Body parts bathed by patient: Right arm, Left arm, Chest, Abdomen, Front perineal area, Right upper leg, Left upper leg, Left lower leg, Face, Right lower leg   Body parts bathed by helper: Buttocks     Bathing assist Assist Level: Moderate Assistance - Patient 50 - 74%     Upper Body Dressing/Undressing Upper body dressing   What is the patient wearing?: Pull over shirt    Upper body assist Assist Level: Supervision/Verbal cueing    Lower Body Dressing/Undressing Lower body dressing      What is the patient wearing?: Pants, Underwear/pull up     Lower body assist Assist for lower body dressing: Minimal Assistance - Patient > 75%     Toileting Toileting    Toileting assist Assist for toileting: Moderate Assistance - Patient 50 - 74%(Seated on BSC) Assistive Device Comment: In standing with use of urinal   Transfers Chair/bed transfer  Transfers assist     Chair/bed transfer assist level: Minimal Assistance - Patient > 75%     Locomotion Ambulation   Ambulation assist   Ambulation activity did not occur: Safety/medical concerns  Assist level: Maximal Assistance - Patient 25 - 49% Assistive device: Walker-rolling Max distance: 25'   Walk 10 feet activity   Assist  Walk 10 feet activity did not occur: Safety/medical concerns  Assist level: Maximal Assistance - Patient 25 - 49% Assistive device: Walker-rolling   Walk 50 feet activity   Assist  Walk 50 feet with 2 turns activity did not occur: Safety/medical concerns         Walk 150 feet activity   Assist Walk 150 feet activity did not occur: Safety/medical concerns         Walk 10 feet on uneven surface  activity   Assist Walk 10 feet on uneven surfaces activity did not occur: Safety/medical concerns         Wheelchair     Assist Will patient use wheelchair at discharge?: Yes Type of Wheelchair: Manual     Wheelchair assist level: Supervision/Verbal cueing Max wheelchair distance: 150'    Wheelchair 50 feet with 2 turns activity    Assist        Assist Level: Supervision/Verbal cueing   Wheelchair 150 feet activity     Assist      Assist Level: Supervision/Verbal cueing   Blood pressure 118/87, pulse 77, temperature 98.2 F (36.8 C), resp. rate 18, height 6\' 1"  (1.854 m), weight 91.4 kg, SpO2 98 %.  Medical Problem List and Plan: 1.  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  Status post negative TEE 01/10/2020  Continue CIR  -Continue PRAFO/WHO at HS    -KI for hamstring tone as needed    -pt with left HF, ?quads in standing. Used shelf AFO successfully yesterday with PT. Will order custom AFO per [employer].  2.  Antithrombotics: -DVT/anticoagulation: Lovenox initiated 01/10/2020  -recent dopplers negative             -antiplatelet therapy: Aspirin 325 mg daily 3. Pain Management: Lidoderm patch, oxycodone 5 mg every 6 hours as needed  -kpad  Controlled on 4/7  -needs to keep hemiplegic shoulder elevated, supported while in bed/chair. 4. Mood: Advised emotional support  -neuropsych consult  -pt denies depression              -antipsychotic agents: N/A 5. Neuropsych: This patient is capable of making decisions on his own behalf. 6. Skin/Wound Care: Routine skin checks 7. Fluids/Electrolytes/Nutrition: encourage PO  -continue to monitor  BMP within normal limits on 3/25, stable on 4/7 8.  Dysphagia.  on regular/thins diet   Eating well 9.  HTN:  off BP meds - monitor   Controlled 4/7 10.  Hyperlipidemia: Continue Lipitor 11.  History of tobacco/alcohol/marijuana use.  NicoDerm patch.  reviewed tobacco/drug use in regard to stroke risk 12. Spastic hemiparesis LLE  -fairly maintained at present  Baclofen 10mg  TID--   Tizanidine 2mg  TID  -orthotics, ROM  -perhaps a  little more subdued on meds above, but seems to be tolerating. Continue to observe.   -have discussed outpt botox with him   LOS: 22 days A FACE TO FACE EVALUATION WAS [place] Chuck Caban 02/06/2020, 5:21 PM

## 2020-02-06 NOTE — Progress Notes (Signed)
Physical Therapy Session Note  Patient Details  Name: Derek [name] MRN: [id number] Date of Birth: Dec 11, 1975  Today's Date: 02/06/2020 PT Individual Time: 1400-1510 PT Individual Time Calculation (min): 70 min   Short Term Goals: Week 3:  PT Short Term Goal 1 (Week 3): Pt will ambulated 30' w/ max assist w/ LRAD consistently PT Short Term Goal 2 (Week 3): Pt will transfer towards L side w/ mod assist PT Short Term Goal 3 (Week 3): Pt will demonstrate trace muscle activation in L quad consistently during functional mobility  Skilled Therapeutic Interventions/Progress Updates:   Pt received sitting in WC and agreeable to PT. Pt requesting to go outside. WC mobility in hall of rehab unit x 330f with supervision assist using hemi technique. Min cues for safety through doorways and improved positioning in seat. Pt performed additional WC mobility in simulated community environment with over cement side walk x 1524fwith supervision assist and min cues for technique to manage down hill grade and prevent loss of control.   Sit<>stand from [place] throughout treatment with min assist to improve symmetrical WB through BLE, and sustain contact with the ground in the LLE. Gait training over cement side walk x 2564fith mod assist overall for RW control and to increase knee flexion in stance to prevent buckle on the LLE. Noted to initiate knee extension in stance 75% of gait.   Additional gait training in rehab gym 2 x [id number] mod assist for safety, knee/hip flexion with advancement and activation of knee extension in stance; noted to require assist 50% of the time to initiate knee extension when in doors.   Pt returned to room and performed squat pivot transfer to bed with min assist to the L. Sit>supine completed with supervision assist, and left supine in bed with call bell in reach and all needs met.           Therapy Documentation Precautions:  Precautions Precautions: Fall Precaution  Comments: L neglect, hemiparesis Required Braces or Orthoses: Other Brace Other Brace: L knee immobilizer Restrictions Weight Bearing Restrictions: No Pain: denies   Therapy/Group: Individual Therapy  AustLorie Phenix/2021, 3:20 PM

## 2020-02-06 NOTE — Evaluation (Signed)
Recreational Therapy Assessment and Plan  Patient Details  Name: Derek [name] MRN: [id number] Date of Birth: 1976/08/28 Today's Date: 02/06/2020  Rehab Potential: Good ELOS: discharge 4/14  Assessment Problem List:      Patient Active Problem List   Diagnosis Date Noted  . Right middle cerebral artery stroke ([place]) 01/15/2020  . Leukocytosis 01/12/2020  . Cerebrovascular accident (CVA) due to thrombosis of right middle cerebral artery ([place])   . Chronic pain syndrome   . Hypokalemia   . Dysphagia, post-stroke   . Acute ischemic right MCA stroke ([place]) 01/07/2020  . Tobacco dependence   . Marijuana dependence ([place])   . Alcohol dependence ([place])     Past Medical History:      Past Medical History:  Diagnosis Date  . Acute ischemic right MCA stroke ([place]) 01/07/2020  . Alcohol dependence ([place])   . Back pain   . Marijuana dependence ([place])   . Tobacco dependence    Past Surgical History:       Past Surgical History:  Procedure Laterality Date  . BUBBLE STUDY  01/10/2020   Procedure: BUBBLE STUDY;  Surgeon: Geralynn Rile, MD;  Location: [facility];  Service: Cardiovascular;;  . TEE WITHOUT CARDIOVERSION N/A 01/10/2020   Procedure: TRANSESOPHAGEAL ECHOCARDIOGRAM (TEE);  Surgeon: Geralynn Rile, MD;  Location: [facility];  Service: Cardiovascular;  Laterality: N/A;    Assessment & Plan Clinical Impression: Patient is a 44 y.o. year old male with history of alcohol/tobacco/marijuana use as well as chronic back pain. History taken from chart review. Patient lives with girlfriend and independent prior to admission. He is employed as a Programmer, systems. Presented 01/07/2020 with left hemiparesis. Blood pressure 184/110. Cranial CT scan showed right MCA infarction with mass-effect possible petechial hemorrhage in portions of the right basal ganglia. Infarcts extending to the posterior superior right frontal and adjacent right parietal lobe.  Suspect thrombus/embolus in the M1 segment of right middle cerebral artery. MRI showed acute right MCA infarction involving basal ganglia and right parietal lobe. Hemorrhagic transformation of the right basal ganglia infarction with mass-effect on the lateral ventricle. Echocardiogram with ejection fraction 65%. Lower extremity Dopplers negative for DVT. Admission chemistries unremarkable except glucose 134, WBC 14,300, CK 787, blood cultures no growth to date, lactic acid within normal limits 1.9, urine culture no growth, urine drug screen positive marijuana. Neurology consulted with TEE completed 01/10/2020 showing no PFO /Thrombus. and currently maintained on aspirin 325 mg daily for CVA prophylaxis. Hold on DAPT at this time given hemorrhagic transformation. Follow-up repeat cranial CT scan 01/09/2020 evolving right MCA infarction again with hemorrhagic transformation, 3 mm right to left shift. No plan for 3% saline at this time and scan was again completed 01/10/2020 showing no new hemorrhage hydrocephalus or worsening mass-effect. Lovenox was initiated for DVT prophylaxis 01/10/2020. Patient did have a low-grade fever 100.5 leukocytosis 14,300 improved to 7.0. Urine culture no growth urinalysis negative blood cultures no growth to date. Therapy evaluations completed and patient was admitted for a comprehensive rehab program.  Patient transferred to CIR on 01/15/2020.   Pt presents with decreased activity tolerance, decreased functional mobility, decreased balance, decreased coordination,left inattention, decreased awareness, decreased problem solving, decreased safety awareness, decreased memory Limiting pt's independence with leisure/community pursuits.   Plan  Min 1 TR session >30 minutes for community reintegration  Recommendations for other services: None   Discharge Criteria: Patient will be discharged from TR if patient refuses treatment 3 consecutive times without medical reason.  If  treatment  goals not met, if there is a change in medical status, if patient makes no progress towards goals or if patient is discharged from hospital.  The above assessment, treatment plan, treatment alternatives and goals were discussed and mutually agreed upon: by patient  [place] 02/06/2020, 3:46 PM

## 2020-02-07 ENCOUNTER — Inpatient Hospital Stay (HOSPITAL_COMMUNITY): Payer: BC Managed Care – PPO | Admitting: Speech Pathology

## 2020-02-07 ENCOUNTER — Inpatient Hospital Stay (HOSPITAL_COMMUNITY): Payer: BC Managed Care – PPO | Admitting: Occupational Therapy

## 2020-02-07 ENCOUNTER — Inpatient Hospital Stay (HOSPITAL_COMMUNITY): Payer: BC Managed Care – PPO | Admitting: Physical Therapy

## 2020-02-07 NOTE — Progress Notes (Signed)
Occupational Therapy Session Note  Patient Details  Name: Derek [name] MRN: [id number] Date of Birth: 03/25/1976  Today's Date: 02/07/2020 OT Individual Time: 0801-0913 OT Individual Time Calculation (min): 72 min    Short Term Goals: Week 3:  OT Short Term Goal 1 (Week 3): Pt will use LUE as stabilizer during grooming with Min multimodal cueing. OT Short Term Goal 1 - Progress (Week 3): Progressing toward goal OT Short Term Goal 2 (Week 3): Patient will don LB clothing with Mod A at sit to stand level. OT Short Term Goal 2 - Progress (Week 3): Met OT Short Term Goal 3 (Week 3): Pt will attend to left environment during grooming tasks with 2-3 cues. OT Short Term Goal 3 - Progress (Week 3): Met OT Short Term Goal 4 (Week 3): Patient will complete toilet transfer to BSC with Mod A. OT Short Term Goal 4 - Progress (Week 3): Met Week 4:  OT Short Term Goal 1 (Week 4): LTG = STG 2/2 ELOS.  Skilled Therapeutic Interventions/Progress Updates:  Patient met lying supine in bed in agreement with OT treatment session. 0/10 pain reported at rest. Patient notes use of K-pad this a.m. that helped to "loosen" his shoulder. Patient expresses concern with tightness in LLE requesting stretching this session. Dorsiflexion/plantar flexion with knee extended and LLE eleated on therapists knee for stretching. Patient reports soiling 2 pairs of pants overnight on Wednesday as nursing staff took additional time to assist patient with toileting. Patient without clothing to change into this a.m. This OT washed patients soiled clothing in patient laundry room as patient reports family would be unable to do so until this weekend. Supine to EOB with Min A and use of bedrail. UB dressing in unsupported sitting at EOB with Sup A and Min verbal/tactile cues. Education on use of urinal in supine with patient able to return demonstrate with Min A for clothing management and placement of urinal. Wc mobility to ADL apartment  with supervision A and hemi technique. Focus on functional transfers to <> wc to bed with Min-Mod A and wc to TTB in tub/shower via squat-pivot transfer and Mod A. Education on safety with bathing seated on TTB with use of grab bars, LHSH, and non-slip surface on bottom of tub. Patient to request pictures of bathroom set-up from fiance as patient has difficulty explaining bathroom set-up this date. Session concluded with patient seated in wc with call bell within reach, belt alarm activated and all needs met.   Therapy Documentation Precautions:  Precautions Precautions: Fall Precaution Comments: L neglect, hemiparesis Required Braces or Orthoses: Other Brace Other Brace: L knee immobilizer Restrictions Weight Bearing Restrictions: No   Therapy/Group: Individual Therapy  Destanae [name] 02/07/2020, 7:37 AM  

## 2020-02-07 NOTE — Progress Notes (Signed)
Physical Therapy Weekly Progress Note  Patient Details  Name: Derek [name] MRN: [id number] Date of Birth: 01/13/1976  Beginning of progress report period: January 30, 2020 End of progress report period: February 07, 2020  Today's Date: 02/07/2020 PT Individual Time: 1000-1058 PT Individual Time Calculation (min): 58 min   Patient has met 3 of 3 short term goals. Pt has made good progress over last week as he is consistently demonstrating L hip flexor and L quad activation during functional activities including gait. He is ambulating ~25' w/ RW w/ mod-max assist consistently. Gait is aided by use of LUE orthosis and L toe cap. Pt w/ no volitional L DF activation, however is able to effectively clear L foot w/o AFO 2/2 suspected flexor synergy. He is performing bed mobility and transfers w/ min assist consistently and self-propelling w/c via R hemi technique w/ supervision.   Patient continues to demonstrate the following deficits muscle weakness and muscle joint tightness, decreased cardiorespiratoy endurance, impaired timing and sequencing and abnormal tone, decreased problem solving and decreased standing balance, decreased postural control, hemiplegia and decreased balance strategies and therefore will continue to benefit from skilled PT intervention to increase functional independence with mobility.  Patient progressing toward long term goals..  Continue plan of care.  PT Short Term Goals Week 3:  PT Short Term Goal 1 (Week 3): Pt will ambulated 30' w/ max assist w/ LRAD consistently PT Short Term Goal 1 - Progress (Week 3): Met PT Short Term Goal 2 (Week 3): Pt will transfer towards L side w/ mod assist PT Short Term Goal 2 - Progress (Week 3): Met PT Short Term Goal 3 (Week 3): Pt will demonstrate trace muscle activation in L quad consistently during functional mobility PT Short Term Goal 3 - Progress (Week 3): Met Week 4:  PT Short Term Goal 1 (Week 4): =LTGs due to ELOS  Skilled  Therapeutic Interventions/Progress Updates:   Pt received in w/c and agreeable to therapy, no c/o pain. Pt self-propelled w/c to/from therapy gym w/ supervision via R hemi technique. Worked on ambulating w/ RW x20' x2, mod-max assist overall. Manual facilitation of lateral weight shifting, occasional manual assist for L step placement, verbal and tactile cues for L hip flexor activation during swing and L quad activation during weight acceptance. Mod assist overall for L knee management. Attempted stairs as pt has stairs to access house (although family looking into ramp options). Negotiated 8, 3", steps w/ max assist overall using unilateral rail. Verbal and tactile cues for technique and step placement, max manual facilitation at pelvis for lateral weight shifting, and mod manual assist to block L knee. Pt w/ insufficient quad strength to maintain L knee extension for prolonged amount of time. Remainder of session focused on postural control in static stance w/o UE support. Sit<>stands from w/c w/ min-mod assist to boost and stabilize in stance. Max verbal, tactile, and manual cues for weight shifting and L quad activation in static stance. Standing starts min assist and fades to max assist after 10-20 sec w/ L lateral lean/LOB. Performed 4-5 stands in total for 1-3 min at a time. Incorporated card matching into a few bouts for distraction/dual task. Returned to room via w/c. Ended session in w/c, all needs in reach. Hot pack applied to posterior thigh for relief of stiffness.   Therapy Documentation Precautions:  Precautions Precautions: Fall Precaution Comments: L neglect, hemiparesis Required Braces or Orthoses: Other Brace Other Brace: L knee immobilizer Restrictions Weight Bearing Restrictions: No  Therapy/Group: Individual Therapy  Dayn Barich Clent Demark 02/07/2020, 10:58 AM

## 2020-02-07 NOTE — Progress Notes (Signed)
Social Work Patient ID: Derek [name], male   DOB: 29-Jan-1976, 44 y.o.   MRN: [id number]   SW left message for pt s/o [name] [phone]) to discuss DME recommendations: BSC, TTB, and w/c with left sided lap tray. SW informed on DME company making efforts to contact to discuss further. SW waiting on follow-up.    [name], MSW, LCSWA Office: [phone] Cell: [phone] Fax: [phone]

## 2020-02-07 NOTE — Progress Notes (Signed)
Speech Language Pathology Daily Session Note  Patient Details  Name: Derek [name] MRN: [id number] Date of Birth: August 20, 1976  Today's Date: 02/07/2020 SLP Individual Time: 1400-1453 SLP Individual Time Calculation (min): 53 min  Short Term Goals: Week 4: SLP Short Term Goal 1 (Week 4): STGs=LTGs due to ELOS  Skilled Therapeutic Interventions: Skilled treatment session focused on cognitive goals. Patient requested assistance utilizing a computer in order to register pertinent information on a specific website. SLP facilitated session by providing Mod A verbal cues for problem solving in regards to navigation and utilization of memory compensatory strategies in order to recall important information like username/password. Extra time was required for recall of biographical information. Patient left upright in wheelchair with alarm on and all needs within reach. Continue with current plan of care.      Pain No/Denies Pain  Therapy/Group: Individual Therapy  Walton Digilio 02/07/2020, 2:59 PM

## 2020-02-07 NOTE — Progress Notes (Signed)
Wife is requesting prescription for wheelchair so can get sales tax waived.

## 2020-02-08 ENCOUNTER — Inpatient Hospital Stay (HOSPITAL_COMMUNITY): Payer: BC Managed Care – PPO | Admitting: Speech Pathology

## 2020-02-08 ENCOUNTER — Inpatient Hospital Stay (HOSPITAL_COMMUNITY): Payer: BC Managed Care – PPO | Admitting: Occupational Therapy

## 2020-02-08 ENCOUNTER — Ambulatory Visit (HOSPITAL_COMMUNITY): Payer: BC Managed Care – PPO | Admitting: *Deleted

## 2020-02-08 ENCOUNTER — Inpatient Hospital Stay (HOSPITAL_COMMUNITY): Payer: BC Managed Care – PPO

## 2020-02-08 NOTE — Progress Notes (Signed)
[facility] PHYSICAL MEDICINE & REHABILITATION PROGRESS NOTE   Subjective/Complaints: Left leg is more tight in the evening and early morning.   ROS: Patient denies fever, rash, sore throat, blurred vision, nausea, vomiting, diarrhea, cough, shortness of breath or chest pain, joint or back pain, headache, or mood change.   Objective:   No results found. Recent Labs    02/06/20 0444  WBC 5.7  HGB 13.5  HCT 40.7  PLT 235   Recent Labs    02/06/20 0444  NA 142  K 4.1  CL 109  CO2 24  GLUCOSE 102*  BUN 10  CREATININE 0.93  CALCIUM 9.4    Intake/Output Summary (Last 24 hours) at 02/08/2020 1034 Last data filed at 02/08/2020 0422 Gross per 24 hour  Intake 360 ml  Output 1550 ml  Net -1190 ml     Physical Exam: Vital Signs Blood pressure 118/76, pulse 67, temperature 97.7 F (36.5 C), temperature source Oral, resp. rate 16, height 6\' 1"  (1.854 m), weight 91.4 kg, SpO2 96 %.  Constitutional: No distress . Vital signs reviewed. HEENT: EOMI, oral membranes moist Neck: supple Cardiovascular: RRR without murmur. No JVD    Respiratory/Chest: CTA Bilaterally without wheezes or rales. Normal effort    GI/Abdomen: BS +, non-tender, non-distended Ext: no clubbing, cyanosis, or edema Psych: pleasant and cooperative Musc: No edema in extremities.  Left shoulder with 1cm sublux still. Less tender with PROM. Neuro: Alert Motor: LUE/LLE: trace left HF,HE only.  2/4 MAS left quads today, gastroc, 1/4 LUE RUE and RLE 5/5.    Assessment/Plan: 1. Functional deficits secondary to right MCA infarct which require 3+ hours per day of interdisciplinary therapy in a comprehensive inpatient rehab setting.  Physiatrist is providing close team supervision and 24 hour management of active medical problems listed below.  Physiatrist and rehab team continue to assess barriers to discharge/monitor patient progress toward functional and medical goals  Care Tool:  Bathing    Body parts bathed  by patient: Right arm, Left arm, Chest, Abdomen, Front perineal area, Right upper leg, Left upper leg, Left lower leg, Face, Right lower leg, Buttocks   Body parts bathed by helper: Buttocks     Bathing assist Assist Level: Minimal Assistance - Patient > 75%     Upper Body Dressing/Undressing Upper body dressing   What is the patient wearing?: Pull over shirt    Upper body assist Assist Level: Supervision/Verbal cueing    Lower Body Dressing/Undressing Lower body dressing      What is the patient wearing?: Pants, Underwear/pull up     Lower body assist Assist for lower body dressing: Minimal Assistance - Patient > 75%     Toileting Toileting    Toileting assist Assist for toileting: Moderate Assistance - Patient 50 - 74%(Seated on BSC) Assistive Device Comment: In standing with use of urinal   Transfers Chair/bed transfer  Transfers assist     Chair/bed transfer assist level: Minimal Assistance - Patient > 75%     Locomotion Ambulation   Ambulation assist   Ambulation activity did not occur: Safety/medical concerns  Assist level: Maximal Assistance - Patient 25 - 49% Assistive device: Walker-rolling Max distance: 20'   Walk 10 feet activity   Assist  Walk 10 feet activity did not occur: Safety/medical concerns  Assist level: Maximal Assistance - Patient 25 - 49% Assistive device: Walker-rolling   Walk 50 feet activity   Assist Walk 50 feet with 2 turns activity did not occur: Safety/medical concerns  Walk 150 feet activity   Assist Walk 150 feet activity did not occur: Safety/medical concerns         Walk 10 feet on uneven surface  activity   Assist Walk 10 feet on uneven surfaces activity did not occur: Safety/medical concerns         Wheelchair     Assist Will patient use wheelchair at discharge?: Yes Type of Wheelchair: Manual    Wheelchair assist level: Supervision/Verbal cueing Max wheelchair distance: 150'     Wheelchair 50 feet with 2 turns activity    Assist        Assist Level: Supervision/Verbal cueing   Wheelchair 150 feet activity     Assist      Assist Level: Supervision/Verbal cueing   Blood pressure 118/76, pulse 67, temperature 97.7 F (36.5 C), temperature source Oral, resp. rate 16, height 6\' 1"  (1.854 m), weight 91.4 kg, SpO2 96 %.  Medical Problem List and Plan: 1.  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  Status post negative TEE 01/10/2020  Continue CIR  -Continue PRAFO/WHO at HS    -KI for hamstring tone as needed    -custom AFO per [employer].  2.  Antithrombotics: -DVT/anticoagulation: Lovenox initiated 01/10/2020  -recent dopplers negative             -antiplatelet therapy: Aspirin 325 mg daily 3. Pain Management: Lidoderm patch, oxycodone 5 mg every 6 hours as needed  -kpad  Controlled on 4/9, kpad helps shoulder a lot   -needs to keep hemiplegic shoulder elevated, supported while in bed/chair. Reviewed again with him today 4. Mood: Advised emotional support  -neuropsych consult  -pt denies depression              -antipsychotic agents: N/A 5. Neuropsych: This patient is capable of making decisions on his own behalf. 6. Skin/Wound Care: Routine skin checks 7. Fluids/Electrolytes/Nutrition: encourage PO  -continue to monitor  BMP within normal limits on 3/25, stable on 4/7 8.  Dysphagia.  on regular/thins diet   Eating well 9.  HTN:  off BP meds - monitor   Controlled 4/9 10.  Hyperlipidemia: Continue Lipitor 11.  History of tobacco/alcohol/marijuana use.  NicoDerm patch.  reviewed tobacco/drug use in regard to stroke risk 12. Spastic hemiparesis LLE  -fairly maintained at present, leg loosens as he gets moving. Tone helps somewhat with ambulation at present also  Baclofen 10mg  TID--   Tizanidine 2mg  TID  -orthotics, ROM  -perhaps a little more  subdued on meds above, but seems to be tolerating. Continue to observe.   -have discussed outpt botox with him   LOS: 24 days A FACE TO FACE EVALUATION WAS PERFORMED  6/9 02/08/2020, 10:34 AM

## 2020-02-08 NOTE — Progress Notes (Signed)
Occupational Therapy Session Note  Patient Details  Name: Derek [name] MRN: [id number] Date of Birth: 1976/01/08  Today's Date: 02/08/2020 OT Individual Time: 0802-0910 OT Individual Time Calculation (min): 68 min    Short Term Goals: Week 3:  OT Short Term Goal 1 (Week 3): Pt will use LUE as stabilizer during grooming with Min multimodal cueing. OT Short Term Goal 1 - Progress (Week 3): Progressing toward goal OT Short Term Goal 2 (Week 3): Patient will don LB clothing with Mod A at sit to stand level. OT Short Term Goal 2 - Progress (Week 3): Met OT Short Term Goal 3 (Week 3): Pt will attend to left environment during grooming tasks with 2-3 cues. OT Short Term Goal 3 - Progress (Week 3): Met OT Short Term Goal 4 (Week 3): Patient will complete toilet transfer to [facility] with Mod A. OT Short Term Goal 4 - Progress (Week 3): Met Week 4:  OT Short Term Goal 1 (Week 4): LTG = STG 2/2 ELOS.  Skilled Therapeutic Interventions/Progress Updates:  Patient met lying supine in bed in agreement with OT treatment session. Pain 7/10 in low back (premedicated). Patient indicating need to void. Return demonstration of voiding in supine with use of urinal and Min A for positioning. Supine to EOB with Min A and anterior scoot toward EOB with close supervision A. Patient able to manipulate wc parts with Min-Mod A and verbal cueing. Patient collected ADL items necessary for shower in ADL apartment with focus on tub/shower transfers. Total assist provided for transfer <> ADL apartment for time management. Patient completed squat-pivot transfer to and from wc to TTB with Min A and cues for sequencing. Bathing in sitting with Min A overall and use of [place]. Patient completed UB dressing seated at sink in wc with supervision A and LB dressing in sitting/standing with Min-Mod A and cues for weight shifting in standing with L knee block. Patient demonstrates impulsivity this date, attempting to stand at sink without  assistance. Oral hygiene seated at sink level with supervision A however patient continues to require total A for incorporation of LUE in functional tasks secondary to flaccidity. Session concluded with patient seated in wc with call bell within reach, belt alarm activated and all needs met. Saebo Estim unavailable at this time.     Therapy Documentation Precautions:  Precautions Precautions: Fall Precaution Comments: L neglect, hemiparesis Required Braces or Orthoses: Other Brace Other Brace: L knee immobilizer Restrictions Weight Bearing Restrictions: No  Therapy/Group: Individual Therapy  [name] [name] 02/08/2020, 7:51 AM

## 2020-02-08 NOTE — Progress Notes (Signed)
Physical Therapy Session Note  Patient Details  Name: Derek [name] MRN: [id number] Date of Birth: 1976/06/14  Today's Date: 02/08/2020 PT Individual Time: 1105-1200 PT Individual Time Calculation (min): 55 min   Short Term Goals: Week 4:  PT Short Term Goal 1 (Week 4): =LTGs due to ELOS  Skilled Therapeutic Interventions/Progress Updates:   Pt received in w/c and agreeable to therapy, no c/o pain. Session focused on simulated community outing to outside of hospital and [employer]. Pt self-propelled w/c in community environment w/ supervision, >1000'. Min assist needed for uphill slope negotiation however. Educated pt on importance of having someone with him in community at all times for this purpose and to assist w/ transfers. Problem-solved transferring to/from booth and to/from public restroom in handicap stall. Pt performed stand pivot to/from booth seat w/ min assist, pt also performing LLE set-up for transfers well and w/o assist from therapist. Problem solved moving about restaurant ([employer]) w/ w/c and ordering food. Pt ordered food w/o assist from therapist. Returned to unit w/ total assist for time management. Set-up w/ lunch. Ended session in w/c and all needs in reach.   Therapy Documentation Precautions:  Precautions Precautions: Fall Precaution Comments: L neglect, hemiparesis Required Braces or Orthoses: Other Brace Other Brace: L knee immobilizer Restrictions Weight Bearing Restrictions: No  Therapy/Group: Individual Therapy  Termaine Roupp [name] 02/08/2020, 12:52 PM

## 2020-02-08 NOTE — Progress Notes (Signed)
Speech Language Pathology Daily Session Note  Patient Details  Name: SHUBHAM THACKSTON MRN: [id number] Date of Birth: 13-Mar-1976  Today's Date: 02/08/2020 SLP Individual Time: 8372-9021 SLP Individual Time Calculation (min): 25 min  Short Term Goals: Week 4: SLP Short Term Goal 1 (Week 4): STGs=LTGs due to ELOS  Skilled Therapeutic Interventions: Skilled treatment session focused on cognitive goals. SLP facilitated session by providing supervision level verbal cues for use of memory compensatory strategies to maximize recall information during a novel card task. Patient also recalled events from previous therapy session independently. Patient left upright in the wheelchair with the alarm on and all needs within reach. Continue with current plan of care.      Pain No/Denies Pain   Therapy/Group: Individual Therapy  Dhruv Christina 02/08/2020, 12:41 PM

## 2020-02-08 NOTE — Progress Notes (Signed)
Social Work Patient ID: Derek [name], male   DOB: 11-16-1975, 44 y.o.   MRN: [id number]   SW made contact with pt s/o Derek [name] [phone]) to discuss DME, family education, and HHA. Will be here for family edu all day on Monday since she will be here for his SSDI phone interview. DME: w/c, BSC, TTB. SW to live [facility] list in room.  SW met with pt in room to provide HHA list and discuss family edu. SW confirmed DME in room: w/c, 3in1 BSC, and TTB.   *Hemiwlaker added. SW sent order to [place]. SW called pt s/o Derek [name] to inform on item being added.    Loralee Pacas, MSW, [place] Office: [phone] Cell: [phone] Fax: [phone]

## 2020-02-08 NOTE — Progress Notes (Signed)
Occupational Therapy Session Note  Patient Details  Name: Derek [name] MRN: [id number] Date of Birth: 11-22-1975  Today's Date: 02/08/2020 OT Individual Time: 1518-1600 OT Individual Time Calculation (min): 42 min    Short Term Goals: Week 1:  OT Short Term Goal 1 (Week 1): Patient will don UB clothing with Min A and 1-3 cues for sequencing. OT Short Term Goal 1 - Progress (Week 1): Progressing toward goal OT Short Term Goal 2 (Week 1): Patient will complete supine <> EOB with Min A in prep for completion of ADLs OT Short Term Goal 2 - Progress (Week 1): Progressing toward goal OT Short Term Goal 3 (Week 1): Patient will complete sit to stand transfers with Mod A and DME as needed in prep for completion of ADLs. OT Short Term Goal 3 - Progress (Week 1): Met  Skilled Therapeutic Interventions/Progress Updates:    1:1. Pt received in w/c agreeable to OT. Pt reporting need to toilet. Pt completes squat pivot transfer with MIN-MOD A in B directions to [facility]. OT completes CM and pt voids bladder and unable to void bowel in toilet. Pt and OT problem solve ability to use urinal independently in prep for community outings. Pt able to unbutton pants button and use boxers (instead of compression shorts) to use urinal. Edu re using velcro on inside of pants instead of button to improve ease of closing pants after urinating. Pt completes sit to stands with hemi walker in prep for CM with Pt able to transition to standing with MIN A and maintain balance with S while OT could complete CM, Exited session with pt setaed in bed, exit alarm on and call light in reach  Therapy Documentation Precautions:  Precautions Precautions: Fall Precaution Comments: L neglect, hemiparesis Required Braces or Orthoses: Other Brace Other Brace: L knee immobilizer Restrictions Weight Bearing Restrictions: No General:   Vital Signs:   Pain:   ADL: ADL Grooming: Minimal assistance Where Assessed-Grooming: Sitting  at sink Upper Body Bathing: Moderate assistance Where Assessed-Upper Body Bathing: Edge of bed Lower Body Bathing: Maximal assistance Where Assessed-Lower Body Bathing: Edge of bed Upper Body Dressing: Moderate assistance Where Assessed-Upper Body Dressing: Edge of bed Lower Body Dressing: Maximal assistance Where Assessed-Lower Body Dressing: Edge of bed Toileting: Maximal assistance Where Assessed-Toileting: Glass blower/designer: Maximal Print production planner Method: Arts development officer: Art gallery manager    Praxis   Exercises:   Other Treatments:     Therapy/Group: Individual Therapy  Tonny Branch 02/08/2020, 4:08 PM

## 2020-02-08 NOTE — Progress Notes (Signed)
Recreational Therapy Session Note  Patient Details  Name: Derek [name] MRN: [id number] Date of Birth: [birth year] Today's Date: 02/08/2020 Time:  7939-0300 Pain: no c/o  Skilled Therapeutic Interventions/Progress Updates:  Pt participated in simualted Community reintegration/outing to  [employer]  at overall supervision w/c level on indoor level surfaces and Min assist for outdoor w/c mobility.  Goals focused on safe community mobility, transfers to seating surfaces, identification & negotiation of obstacles, accessing public restroom, energy conservation techniques/education.    Therapy/Group: Co-TreatmentSIMPSON,Yaxiel Minnie 02/08/2020, 8:00 AM

## 2020-02-09 ENCOUNTER — Inpatient Hospital Stay (HOSPITAL_COMMUNITY): Payer: BC Managed Care – PPO | Admitting: Physical Therapy

## 2020-02-09 NOTE — Plan of Care (Signed)
  Problem: Consults Goal: RH STROKE PATIENT EDUCATION Description: See Patient Education module for education specifics  Outcome: Progressing   Problem: RH BOWEL ELIMINATION Goal: RH STG MANAGE BOWEL WITH ASSISTANCE Description: STG Manage Bowel with min Assistance. Outcome: Progressing Goal: RH STG MANAGE BOWEL W/MEDICATION W/ASSISTANCE Description: STG Manage Bowel with Medication with min Assistance. Outcome: Progressing   Problem: RH BLADDER ELIMINATION Goal: RH STG MANAGE BLADDER WITH ASSISTANCE Description: STG Manage Bladder With min Assistance Outcome: Progressing Goal: RH STG MANAGE BLADDER WITH MEDICATION WITH ASSISTANCE Description: STG Manage Bladder With Medication With min Assistance. Outcome: Progressing   Problem: RH SKIN INTEGRITY Goal: RH STG SKIN FREE OF INFECTION/BREAKDOWN Description: Patients skin will remain free from further breakdown or infection with min assist. Outcome: Progressing   Problem: RH SAFETY Goal: RH STG ADHERE TO SAFETY PRECAUTIONS W/ASSISTANCE/DEVICE Description: STG Adhere to Safety Precautions With min Assistance/Device. Outcome: Progressing   Problem: RH PAIN MANAGEMENT Goal: RH STG PAIN MANAGED AT OR BELOW PT'S PAIN GOAL Description: < 4 Outcome: Progressing   Problem: RH KNOWLEDGE DEFICIT Goal: RH STG INCREASE KNOWLEDGE OF DIABETES Description: Patient/caregiver will verbalize understanding of DM management including monitoring, diet, exercise, medications, and follow up care with min assist. Outcome: Progressing Goal: RH STG INCREASE KNOWLEDGE OF HYPERTENSION Description: Patient/caregiver will verbalize understanding of HTN management including monitoring, diet, exercise, medications, and follow up care with min assist. Outcome: Progressing Goal: RH STG INCREASE KNOWLEDGE OF STROKE PROPHYLAXIS Description: Patient/caregiver will verbalize understanding of stroke management including monitoring, diet, exercise, medications, and  follow up care with min assist. Outcome: Progressing

## 2020-02-09 NOTE — Progress Notes (Signed)
Physical Therapy Session Note  Patient Details  Name: Derek [name] MRN: [id number] Date of Birth: May 31, 1976  Today's Date: 02/09/2020 PT Individual Time: 0920-1000 and 7829-5621 PT Individual Time Calculation (min): 40 min and 43 min   Short Term Goals: Week 4:  PT Short Term Goal 1 (Week 4): =LTGs due to ELOS  Skilled Therapeutic Interventions/Progress Updates:    Session 1: Pt received supine in bed and agreeable to therapy session. Supine>sitting L EOB with mod cuing for sequencing to increase pt independence and CGA for steadying. Sitting EOB therapist donned B shoes max assist for time management. L squat pivot transfer EOB>w/c with light mod assist for lifting/pivoting hips and cuing for head/hips relationship and R UE placement. R hemi-technique w/c propulsion ~219ft to ortho gym with distant supervision targeting activity tolerance. Pt reports he will have a sedan to transfer into at D/C. Educated pt on proper set-up for car transfer with w/c positioning, arm rest and leg rest management. Blocked practice squat pivot transfers car<>w/c with initially mod assist for lifting/pivoting hips progressed to min assist; however, suggested it may be appropriate to perform real car transfer prior to discharge if possible because simulator is more open - with significantly increased time pt able to manage L LE in/out of vehicle using R UE to assist. R hemi-technique w/c propulsion ~141ft to ADL apartment with supervision. Discussed bed<>w/c squat pivot transfers with pt practicing pivoting L to get into the bed with light mod assist for lifting/pivoting hips and min assist for pivoting R back to w/c. R hemi-technique w/c propulsion back to room and left seated in w/c with needs in reach, L UE supported on w/c tray, and seat belt alarm on.  Session 2: Pt received sitting in w/c and agreeable to therapy session. R hemi-technique w/c propulsion ~124ft to therapy gym with distant supervision. Therapist  donned L LE GRAFO for ambulation. Sit>stand w/c>RW with min assist for balance due to minor lateral lean. Gait training ~71ft, ~78ft (seated break between and each walk consisting of 1-2 turns) using RW with mod assist for balance and therapist manually facilitating R/L lateral weight shifting during stance and facilitating increased L knee extension during stance (only slight buckle when stepping backwards and back of knee touching the mat) - with increased distance demonstrated minor worsening of L LE flexor tone requiring occasional assist to increased BOS width due to scissoring. R hemi-technique w/c propulsion ~155ft to ADL apartment. Problem solved best technique for w/c<>couch squat pivot transfers. Initially tried sitting on R end of couch to allow R UE to push and pull from arm rest but this made it more difficult to lift hips from low surface of couch during left transfer back to w/c - required heavier mod assist for lifting/pivoting hips. Tried sitting on L end of couch where L UE would be supported on armrest and pt with increased ease to transfer R back to w/c from the low couch seat height - lighter mod assist for lifting/pivoting hips. R hemi-technique w/c propulsion ~231ft back to room. Pt reports need to use bathroom. Sit>stand w/c>stedy with min assist for balance. Stedy transported into bathroom. Sit>stand from stedy seat with CGA/min assist - standing with UE support on stedy bar and CGA for steadying therapist performed total assist LB clothing management. Pt left seated on BSC over toilet with stedy in place and NT present to assume care of patient.   Therapy Documentation Precautions:  Precautions Precautions: Fall Precaution Comments: L neglect, hemiparesis Required Braces  or Orthoses: Other Brace Other Brace: L knee immobilizer Restrictions Weight Bearing Restrictions: No  Pain: Session 1: No reports of pain throughout session.  Session 2: No reports of pain throughout  session.  Therapy/Group: Individual Therapy  Ginny Forth, PT, DPT 02/09/2020, 7:50 AM

## 2020-02-09 NOTE — Progress Notes (Signed)
[place] PHYSICAL MEDICINE & REHABILITATION PROGRESS NOTE   Subjective/Complaints: Denies pain. Moving bowels regularly Sleeping well at night. Would like for his close friend to replace his aunt as visitor; have discussed with nursing staff  ROS: Patient denies fever, rash, sore throat, blurred vision, nausea, vomiting, diarrhea, cough, shortness of breath or chest pain, joint or back pain, headache, or mood change.   Objective:   No results found. No results for input(s): WBC, HGB, HCT, PLT in the last 72 hours. No results for input(s): NA, K, CL, CO2, GLUCOSE, BUN, CREATININE, CALCIUM in the last 72 hours.  Intake/Output Summary (Last 24 hours) at 02/09/2020 1630 Last data filed at 02/09/2020 1320 Gross per 24 hour  Intake 780 ml  Output 2250 ml  Net -1470 ml     Physical Exam: Vital Signs Blood pressure 110/66, pulse 76, temperature 98.2 F (36.8 C), temperature source Oral, resp. rate 17, height 6\' 1"  (1.854 m), weight 91.4 kg, SpO2 98 %.  Constitutional: No distress . Vital signs reviewed. Sitting up in [facility] comfortably HEENT: EOMI, oral membranes moist Neck: supple Cardiovascular: RRR without murmur. No JVD    Respiratory/Chest: CTA Bilaterally without wheezes or rales. Normal effort    GI/Abdomen: BS +, non-tender, non-distended Ext: no clubbing, cyanosis, or edema Psych: pleasant and cooperative Musc: No edema in extremities.  Left shoulder with 1cm sublux still. Less tender with PROM. Neuro: Alert Motor: LUE/LLE: trace left HF,HE only.  2/4 MAS left quads today, gastroc, 1/4 LUE RUE and RLE 5/5.    Assessment/Plan: 1. Functional deficits secondary to right MCA infarct which require 3+ hours per day of interdisciplinary therapy in a comprehensive inpatient rehab setting.  Physiatrist is providing close team supervision and 24 hour management of active medical problems listed below.  Physiatrist and rehab team continue to assess barriers to discharge/monitor  patient progress toward functional and medical goals  Care Tool:  Bathing    Body parts bathed by patient: Right arm, Left arm, Chest, Abdomen, Front perineal area, Right upper leg, Left upper leg, Left lower leg, Face, Right lower leg, Buttocks   Body parts bathed by helper: Buttocks     Bathing assist Assist Level: Minimal Assistance - Patient > 75%     Upper Body Dressing/Undressing Upper body dressing   What is the patient wearing?: Pull over shirt    Upper body assist Assist Level: Supervision/Verbal cueing    Lower Body Dressing/Undressing Lower body dressing      What is the patient wearing?: Pants, Underwear/pull up     Lower body assist Assist for lower body dressing: Minimal Assistance - Patient > 75%     Toileting Toileting    Toileting assist Assist for toileting: Moderate Assistance - Patient 50 - 74%(Seated on BSC) Assistive Device Comment: In standing with use of urinal   Transfers Chair/bed transfer  Transfers assist     Chair/bed transfer assist level: Minimal Assistance - Patient > 75%     Locomotion Ambulation   Ambulation assist   Ambulation activity did not occur: Safety/medical concerns  Assist level: Maximal Assistance - Patient 25 - 49% Assistive device: Walker-rolling Max distance: 20'   Walk 10 feet activity   Assist  Walk 10 feet activity did not occur: Safety/medical concerns  Assist level: Maximal Assistance - Patient 25 - 49% Assistive device: Walker-rolling   Walk 50 feet activity   Assist Walk 50 feet with 2 turns activity did not occur: Safety/medical concerns  Walk 150 feet activity   Assist Walk 150 feet activity did not occur: Safety/medical concerns         Walk 10 feet on uneven surface  activity   Assist Walk 10 feet on uneven surfaces activity did not occur: Safety/medical concerns         Wheelchair     Assist Will patient use wheelchair at discharge?: Yes Type of  Wheelchair: Manual    Wheelchair assist level: Supervision/Verbal cueing Max wheelchair distance: 150'    Wheelchair 50 feet with 2 turns activity    Assist        Assist Level: Supervision/Verbal cueing   Wheelchair 150 feet activity     Assist      Assist Level: Supervision/Verbal cueing   Blood pressure 110/66, pulse 76, temperature 98.2 F (36.8 C), temperature source Oral, resp. rate 17, height 6\' 1"  (1.854 m), weight 91.4 kg, SpO2 98 %.  Medical Problem List and Plan: 1.  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  Status post negative TEE 01/10/2020  Continue CIR  -Continue PRAFO/WHO at HS    -KI for hamstring tone as needed    -custom AFO per [employer].  2.  Antithrombotics: -DVT/anticoagulation: Lovenox initiated 01/10/2020  -recent dopplers negative             -antiplatelet therapy: Aspirin 325 mg daily 3. Pain Management: Lidoderm patch, oxycodone 5 mg every 6 hours as needed  -kpad  Controlled on 4/9, kpad helps shoulder a lot   -needs to keep hemiplegic shoulder elevated, supported while in bed/chair. Reviewed again with him today 4. Mood: Advised emotional support  -neuropsych consult  -pt denies depression              -antipsychotic agents: N/A 5. Neuropsych: This patient is capable of making decisions on his own behalf. 6. Skin/Wound Care: Routine skin checks 7. Fluids/Electrolytes/Nutrition: encourage PO  -continue to monitor  BMP within normal limits on 3/25, stable on 4/7 8.  Dysphagia.  on regular/thins diet   Eating well 9.  HTN:  off BP meds - monitor   Controlled 4/10 10.  Hyperlipidemia: Continue Lipitor 11.  History of tobacco/alcohol/marijuana use.  NicoDerm patch.  reviewed tobacco/drug use in regard to stroke risk 12. Spastic hemiparesis LLE  -fairly maintained at present, leg loosens as he gets moving. Tone helps somewhat with ambulation  at present also  Baclofen 10mg  TID--   Tizanidine 2mg  TID  -orthotics, ROM  -perhaps a little more subdued on meds above, but seems to be tolerating. Continue to observe.   -have discussed outpt botox with him   LOS: 25 days A FACE TO FACE EVALUATION WAS PERFORMED  Elanor Cale P Amiliana Foutz 02/09/2020, 4:30 PM

## 2020-02-10 ENCOUNTER — Inpatient Hospital Stay (HOSPITAL_COMMUNITY): Payer: BC Managed Care – PPO | Admitting: Speech Pathology

## 2020-02-10 MED ORDER — BACLOFEN 5 MG HALF TABLET
15.0000 mg | ORAL_TABLET | Freq: Three times a day (TID) | ORAL | Status: DC
  Administered 2020-02-10 – 2020-02-12 (×6): 15 mg via ORAL
  Filled 2020-02-10 (×6): qty 1

## 2020-02-10 NOTE — Plan of Care (Signed)
  Problem: Consults Goal: RH STROKE PATIENT EDUCATION Description: See Patient Education module for education specifics  Outcome: Progressing   Problem: RH BOWEL ELIMINATION Goal: RH STG MANAGE BOWEL WITH ASSISTANCE Description: STG Manage Bowel with min Assistance. Outcome: Progressing Goal: RH STG MANAGE BOWEL W/MEDICATION W/ASSISTANCE Description: STG Manage Bowel with Medication with min Assistance. Outcome: Progressing   Problem: RH BLADDER ELIMINATION Goal: RH STG MANAGE BLADDER WITH ASSISTANCE Description: STG Manage Bladder With min Assistance Outcome: Progressing Goal: RH STG MANAGE BLADDER WITH MEDICATION WITH ASSISTANCE Description: STG Manage Bladder With Medication With min Assistance. Outcome: Progressing   Problem: RH SKIN INTEGRITY Goal: RH STG SKIN FREE OF INFECTION/BREAKDOWN Description: Patients skin will remain free from further breakdown or infection with min assist. Outcome: Progressing   Problem: RH SAFETY Goal: RH STG ADHERE TO SAFETY PRECAUTIONS W/ASSISTANCE/DEVICE Description: STG Adhere to Safety Precautions With min Assistance/Device. Outcome: Progressing   Problem: RH PAIN MANAGEMENT Goal: RH STG PAIN MANAGED AT OR BELOW PT'S PAIN GOAL Description: < 4 Outcome: Progressing   Problem: RH KNOWLEDGE DEFICIT Goal: RH STG INCREASE KNOWLEDGE OF DIABETES Description: Patient/caregiver will verbalize understanding of DM management including monitoring, diet, exercise, medications, and follow up care with min assist. Outcome: Progressing Goal: RH STG INCREASE KNOWLEDGE OF HYPERTENSION Description: Patient/caregiver will verbalize understanding of HTN management including monitoring, diet, exercise, medications, and follow up care with min assist. Outcome: Progressing Goal: RH STG INCREASE KNOWLEDGE OF STROKE PROPHYLAXIS Description: Patient/caregiver will verbalize understanding of stroke management including monitoring, diet, exercise, medications, and  follow up care with min assist. Outcome: Progressing   

## 2020-02-10 NOTE — Progress Notes (Signed)
[place] PHYSICAL MEDICINE & REHABILITATION PROGRESS NOTE   Subjective/Complaints: Denies pain. Moving bowels regularly Sleeping well at night. Reports increased stiffness, particularly in left leg, despite stretching.   ROS: Patient denies fever, rash, sore throat, blurred vision, nausea, vomiting, diarrhea, cough, shortness of breath or chest pain, joint or back pain, headache, or mood change.   Objective:   No results found. No results for input(s): WBC, HGB, HCT, PLT in the last 72 hours. No results for input(s): NA, K, CL, CO2, GLUCOSE, BUN, CREATININE, CALCIUM in the last 72 hours.  Intake/Output Summary (Last 24 hours) at 02/10/2020 1204 Last data filed at 02/10/2020 0900 Gross per 24 hour  Intake 480 ml  Output 1300 ml  Net -820 ml     Physical Exam: Vital Signs Blood pressure 105/70, pulse 76, temperature 97.7 F (36.5 C), temperature source Oral, resp. rate 16, height 6\' 1"  (1.854 m), weight 91.4 kg, SpO2 95 %.  Constitutional: No distress . Vital signs reviewed. Sitting up in [facility] comfortably HEENT: EOMI, oral membranes moist Neck: supple Cardiovascular: RRR without murmur. No JVD    Respiratory/Chest: CTA Bilaterally without wheezes or rales. Normal effort    GI/Abdomen: BS +, non-tender, non-distended Ext: no clubbing, cyanosis, or edema Psych: pleasant and cooperative Musc: No edema in extremities.  Left shoulder with 1cm sublux still. Less tender with PROM. Neuro: Alert Motor: LUE/LLE: trace left HF,HE only.  2/4 MAS left quads today, gastroc, 1+/4 LUE RUE and RLE 5/5.    Assessment/Plan: 1. Functional deficits secondary to right MCA infarct which require 3+ hours per day of interdisciplinary therapy in a comprehensive inpatient rehab setting.  Physiatrist is providing close team supervision and 24 hour management of active medical problems listed below.  Physiatrist and rehab team continue to assess barriers to discharge/monitor patient progress toward  functional and medical goals  Care Tool:  Bathing    Body parts bathed by patient: Right arm, Left arm, Chest, Abdomen, Front perineal area, Right upper leg, Left upper leg, Left lower leg, Face, Right lower leg, Buttocks   Body parts bathed by helper: Buttocks     Bathing assist Assist Level: Minimal Assistance - Patient > 75%     Upper Body Dressing/Undressing Upper body dressing   What is the patient wearing?: Pull over shirt    Upper body assist Assist Level: Supervision/Verbal cueing    Lower Body Dressing/Undressing Lower body dressing      What is the patient wearing?: Pants, Underwear/pull up     Lower body assist Assist for lower body dressing: Minimal Assistance - Patient > 75%     Toileting Toileting    Toileting assist Assist for toileting: Moderate Assistance - Patient 50 - 74%(Seated on BSC) Assistive Device Comment: In standing with use of urinal   Transfers Chair/bed transfer  Transfers assist     Chair/bed transfer assist level: Minimal Assistance - Patient > 75%     Locomotion Ambulation   Ambulation assist   Ambulation activity did not occur: Safety/medical concerns  Assist level: Moderate Assistance - Patient 50 - 74% Assistive device: Walker-rolling Max distance: 91ft   Walk 10 feet activity   Assist  Walk 10 feet activity did not occur: Safety/medical concerns  Assist level: Moderate Assistance - Patient - 50 - 74% Assistive device: Walker-rolling   Walk 50 feet activity   Assist Walk 50 feet with 2 turns activity did not occur: Safety/medical concerns  Assist level: Moderate Assistance - Patient - 50 - 74%  Assistive device: Walker-rolling    Walk 150 feet activity   Assist Walk 150 feet activity did not occur: Safety/medical concerns         Walk 10 feet on uneven surface  activity   Assist Walk 10 feet on uneven surfaces activity did not occur: Safety/medical concerns          Wheelchair     Assist Will patient use wheelchair at discharge?: Yes Type of Wheelchair: Manual    Wheelchair assist level: Supervision/Verbal cueing Max wheelchair distance: 272ft    Wheelchair 50 feet with 2 turns activity    Assist        Assist Level: Supervision/Verbal cueing   Wheelchair 150 feet activity     Assist      Assist Level: Supervision/Verbal cueing   Blood pressure 105/70, pulse 76, temperature 97.7 F (36.5 C), temperature source Oral, resp. rate 16, height 6\' 1"  (1.854 m), weight 91.4 kg, SpO2 95 %.  Medical Problem List and Plan: 1.  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  Status post negative TEE 01/10/2020  Continue CIR  -Continue PRAFO/WHO at HS    -KI for hamstring tone as needed    -custom AFO per [employer].  2.  Antithrombotics: -DVT/anticoagulation: Lovenox initiated 01/10/2020  -recent dopplers negative             -antiplatelet therapy: Aspirin 325 mg daily 3. Pain Management: Lidoderm patch, oxycodone 5 mg every 6 hours as needed  -kpad  Controlled on 4/9, kpad helps shoulder a lot   -needs to keep hemiplegic shoulder elevated, supported while in bed/chair. Reviewed again with him today 4. Mood: Advised emotional support  -neuropsych consult  -pt denies depression              -antipsychotic agents: N/A 5. Neuropsych: This patient is capable of making decisions on his own behalf. 6. Skin/Wound Care: Routine skin checks 7. Fluids/Electrolytes/Nutrition: encourage PO  -continue to monitor  BMP within normal limits on 3/25, stable on 4/7 8.  Dysphagia.  on regular/thins diet   Eating well 9.  HTN:  off BP meds - monitor   4/11: Low at 105/70, asymptomatic. Monitor with increased Baclofen dose.  10.  Hyperlipidemia: Continue Lipitor 11.  History of tobacco/alcohol/marijuana use.  NicoDerm patch.  reviewed tobacco/drug use in  regard to stroke risk 12. Spastic hemiparesis LLE  -fairly maintained at present, leg loosens as he gets moving. Tone helps somewhat with ambulation at present also  Baclofen 10mg  TID-- 4/11: with increasing LLE stiffness despite stretching. Increased Baclofen to 15mg  TID monitor for oversedation  Tizanidine 2mg  TID  -orthotics, ROM  -perhaps a little more subdued on meds above, but seems to be tolerating. Continue to observe.   -have discussed outpt botox with him   LOS: 26 days A FACE TO FACE EVALUATION WAS PERFORMED  P Ciro Tashiro 02/10/2020, 12:04 PM

## 2020-02-10 NOTE — Progress Notes (Signed)
Speech Language Pathology Daily Session Note  Patient Details  Name: Derek [name] MRN: [id number] Date of Birth: 19-Feb-1976  Today's Date: 02/10/2020 SLP Individual Time: 1345-1425 SLP Individual Time Calculation (min): 40 min  Short Term Goals: Week 4: SLP Short Term Goal 1 (Week 4): STGs=LTGs due to ELOS  Skilled Therapeutic Interventions: Pt was seen for skilled ST targeting cognitive goals. Pt was Mod I for recall in order to locate familiar places around the unit during a functional memory scavenger hunt. He also demonstrated excellent recall of primary ST and last session he had with her  - he "taught" SLP how to play the semi-complex card game previously targeted with Supervision A question cues. Verbal cue X2 provided to reduce impulsivity during task. During functional conversation regarding upcoming discharge pt anticipated his needs for equipment Mod I (tub bench, wheelchair, comode, walker). Pt returned to room and left sitting in wheelchair with seatbelt alarm set and needs within reach. Continue per current plan of care.         Pain Pain Assessment Pain Scale: 0-10 Pain Score: 0-No pain  Therapy/Group: Individual Therapy  Derek [name] 02/10/2020, 2:26 PM

## 2020-02-11 ENCOUNTER — Inpatient Hospital Stay (HOSPITAL_COMMUNITY): Payer: BC Managed Care – PPO | Admitting: Physical Therapy

## 2020-02-11 ENCOUNTER — Inpatient Hospital Stay (HOSPITAL_COMMUNITY): Payer: BC Managed Care – PPO | Admitting: Occupational Therapy

## 2020-02-11 ENCOUNTER — Inpatient Hospital Stay (HOSPITAL_COMMUNITY): Payer: BC Managed Care – PPO | Admitting: Speech Pathology

## 2020-02-11 NOTE — Progress Notes (Signed)
Physical Therapy Session Note  Patient Details  Name: [name] MRN: [id number] Date of Birth: [birth year]  Today's Date: 02/11/2020 PT Individual Time: 1300-1409 PT Individual Time Calculation (min): 69 min   Short Term Goals: Week 4:  PT Short Term Goal 1 (Week 4): =LTGs due to ELOS  Skilled Therapeutic Interventions/Progress Updates:   Pt received in w/c with [name] (girlfriend) present. Session focused on caregiver education. Educated her on providing CGA-min assist for all standing mobility including sit<>stands and transfers. Pt is supervision for bed mobility and also demonstrated this on ADL apartment bed. Both pt and wife understand that he is not safe to ambulate at this time except with a therapist. Therapist demonstrated toilet transfer to/from [facility] and EOB, wife assisted x2-3 reps to/from with each transfer safely using gait belt and providing appropriate cues to pt for safety and set-up. Discussed use of HW for support in static stance and practiced this while [name] provided total assist for LE garment management simply for practice while supporting him in stance. Discussed that pt is supervision from seated level for all bathing/dressing with exception of LLE management which he can direct his care appropriately. Educated wife on w/c parts management including leg rests, armrests, and set-up of transfers. Pt is able to perform majority of this w/ supervision. Discussed performing all bathing/dressing and toileting on BSC in bedroom vs going into bathroom if w/c does not fit into bathroom. If w/c does fit into bathroom, practiced tub bench transfer w/ min assist as well. [name] did not provide hands-on for this practice, cautioned both to wait until an OT has formally trained [name] on showering [name] not present for OT education this AM). Practiced car transfer to SUV height w/ min assist, therapist performed 1 rep and [name] assisted safely and appropriately w/ 2nd rep. Discussed  practicing w/ real car prior to d/c on Wed. Both in agreement. Provided [name] w/ w/c stair boosting handout for d/c on Wednesday. She plans to have 2 male family members present to perform w/c bumping as ramp will not be built prior to d/c on Wednesday. Returned to room and ended session in w/c, all needs in reach.   Therapy Documentation Precautions:  Precautions Precautions: Fall Precaution Comments: L neglect, hemiparesis Required Braces or Orthoses: Other Brace Other Brace: L knee immobilizer Restrictions Weight Bearing Restrictions: No  Therapy/Group: Individual Therapy  Breya Cass [name] 02/11/2020, 2:10 PM

## 2020-02-11 NOTE — Progress Notes (Signed)
Social Work Patient ID: Derek [name], male   DOB: [birth year], 44 y.o.   MRN: [id number]    SW received return phone call from pt s/o [name] who reported preferred [facility] is [facility].   SW spoke with [facility] [phone]) to discuss HHPT/OT/ST referral. Referral declined.   SW spoke with [name]/[facility] [phone]) about [facility] referral. SW waiting on follow-up.   [name], MSW, LCSWA Office: [phone] Cell: [phone] Fax: [phone]

## 2020-02-11 NOTE — Discharge Summary (Signed)
Physician Discharge Summary  Patient ID: Derek [name] MRN: [id number] DOB/AGE: 1975-12-06 44 y.o.  Admit date: 01/15/2020 Discharge date: 02/13/2020  Discharge Diagnoses:  Principal Problem:   Right middle cerebral artery stroke [facility]) Active Problems:   Spastic hemiparesis (HCC)   History of hypertension   Dyslipidemia   Pain DVT prophylaxis History of tobacco alcohol and marijuana use  Discharged Condition: Stable  Significant Diagnostic Studies: DG Swallowing Func-Speech Pathology  Result Date: 01/14/2020 Objective Swallowing Evaluation: Type of Study: MBS-Modified Barium Swallow Study  Patient Details Name: Derek [name] MRN: [id number] Date of Birth: September 17, 1976 Today's Date: 01/14/2020 Time: SLP Start Time (ACUTE ONLY): 1225 -SLP Stop Time (ACUTE ONLY): 1236 SLP Time Calculation (min) (ACUTE ONLY): 11 min Past Medical History: Past Medical History: Diagnosis Date . 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/07/2020 . Alcohol dependence (HCC)  . Back pain  . Marijuana dependence (HCC)  . Tobacco dependence  Past Surgical History: Past Surgical History: Procedure Laterality Date . BUBBLE STUDY  01/10/2020  Procedure: BUBBLE STUDY;  Surgeon: Sande Rives, MD;  Location: [facility] ENDOSCOPY;  Service: Cardiovascular;; . TEE WITHOUT CARDIOVERSION N/A 01/10/2020  Procedure: TRANSESOPHAGEAL ECHOCARDIOGRAM (TEE);  Surgeon: Sande Rives, MD;  Location: [facility] ENDOSCOPY;  Service: Cardiovascular;  Laterality: N/A; HPI:  Derek [name] is a 44 y.o. male with no known medical history presenting as Code Stroke.  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 There is hemorrhagic transformation of the right basal ganglia  Subjective: Pt was lethargic Assessment / Plan / Recommendation CHL IP CLINICAL IMPRESSIONS 01/14/2020 Clinical Impression Pt demonstrates a mild dysphagia with instances of left anterior bolus loss and fairly consistent delay in swallow  initiation to pyriform sinuses. Despite these deficits, pt was able to orally manage thin liquids with a straw and masticate solids without oral residue. Pt only had instances of trace flash penetration, and one very trace frank penetration event with sensation, even when challenged with large consecutive sips.  Pt is able to upgrade diet to regular solids and thin liquids. Will f/u for tolerance and compensatory strategies as needed.  SLP Visit Diagnosis Dysphagia, oropharyngeal phase (R13.12) Attention and concentration deficit following -- Frontal lobe and executive function deficit following -- Impact on safety and function Mild aspiration risk   CHL IP TREATMENT RECOMMENDATION 01/14/2020 Treatment Recommendations Therapy as outlined in treatment plan below   Prognosis 01/14/2020 Prognosis for Safe Diet Advancement Good Barriers to Reach Goals -- Barriers/Prognosis Comment -- CHL IP DIET RECOMMENDATION 01/14/2020 SLP Diet Recommendations Regular solids;Thin liquid Liquid Administration via Cup;Straw Medication Administration Whole meds with puree Compensations Slow rate;Small sips/bites;Lingual sweep for clearance of pocketing Postural Changes Seated upright at 90 degrees   CHL IP OTHER RECOMMENDATIONS 01/14/2020 Recommended Consults -- Oral Care Recommendations Oral care BID Other Recommendations --   CHL IP FOLLOW UP RECOMMENDATIONS 01/14/2020 Follow up Recommendations Inpatient Rehab   CHL IP FREQUENCY AND DURATION 01/14/2020 Speech Therapy Frequency (ACUTE ONLY) min 2x/week Treatment Duration 2 weeks      CHL IP ORAL PHASE 01/14/2020 Oral Phase Impaired Oral - Pudding Teaspoon -- Oral - Pudding Cup -- Oral - Honey Teaspoon -- Oral - Honey Cup -- Oral - Nectar Teaspoon -- Oral - Nectar Cup -- Oral - Nectar Straw WFL Oral - Thin Teaspoon -- Oral - Thin Cup Left anterior bolus loss Oral - Thin Straw WFL Oral - Puree WFL Oral - Mech Soft -- Oral - Regular WFL Oral - Multi-Consistency --  Oral - Pill Decreased bolus  cohesion Oral Phase - Comment --  CHL IP PHARYNGEAL PHASE 01/14/2020 Pharyngeal Phase Impaired Pharyngeal- Pudding Teaspoon -- Pharyngeal -- Pharyngeal- Pudding Cup -- Pharyngeal -- Pharyngeal- Honey Teaspoon -- Pharyngeal -- Pharyngeal- Honey Cup -- Pharyngeal -- Pharyngeal- Nectar Teaspoon -- Pharyngeal -- Pharyngeal- Nectar Cup -- Pharyngeal -- Pharyngeal- Nectar Straw Delayed swallow initiation-vallecula Pharyngeal -- Pharyngeal- Thin Teaspoon -- Pharyngeal -- Pharyngeal- Thin Cup Delayed swallow initiation-pyriform sinuses Pharyngeal -- Pharyngeal- Thin Straw Delayed swallow initiation-pyriform sinuses Pharyngeal -- Pharyngeal- Puree Delayed swallow initiation-vallecula Pharyngeal -- Pharyngeal- Mechanical Soft -- Pharyngeal -- Pharyngeal- Regular Delayed swallow initiation-vallecula Pharyngeal -- Pharyngeal- Multi-consistency -- Pharyngeal -- Pharyngeal- Pill -- Pharyngeal -- Pharyngeal Comment --  No flowsheet data found. DeBlois, Riley Nearing 01/14/2020, 1:27 PM               Labs:  Basic Metabolic Panel: No results for input(s): NA, K, CL, CO2, GLUCOSE, BUN, CREATININE, CALCIUM, MG, PHOS in the last 168 hours.  CBC: No results for input(s): WBC, NEUTROABS, HGB, HCT, MCV, PLT in the last 168 hours.  CBG: No results for input(s): GLUCAP in the last 168 hours.  Family history.  Positive for hypertension and hyperlipidemia.  Negative for CVA CAD or rectal cancer  Brief HPI:   Derek [name] is a 44 y.o. right-handed male with history of alcohol tobacco and marijuana use as well as chronic back pain.  He lives with his girlfriend independent prior to admission.  He is employed as a Agricultural consultant.  Presented 01/07/2020 with left-sided weakness.  Blood pressure 184/110.  Cranial CT scan showed right MCA infarction with mass-effect possible petechial hemorrhage in portions of the right basal ganglia.  Infarcts extending to the posterior superior right frontal and adjacent right parietal  lobe.  Suspect thrombus embolus in the M1 segment of the right middle cerebral artery.  MRI showed acute right MCA infarction involving basal ganglia and right parietal lobe.  Hemorrhagic transformation of the right basal ganglia infarction with mass-effect on the lateral ventricle.  Echocardiogram with ejection fraction of 65%.  Lower extremity Dopplers negative for DVT.  Admission chemistries unremarkable except glucose 134, WBC 14,300, CK 787, blood cultures no growth to date, lactic acid within normal limits, urine culture no growth, urine drug screen positive marijuana.  Neurology consulted with TEE completed 01/10/2020 showing no PFO thrombus currently maintained on aspirin 325 mg for CVA prophylaxis.  Hold on DAPT at this time given hemorrhagic transformation.  Follow-up repeat cranial CT scan 01/09/2020 evolving right MCA infarction again with hemorrhagic transformation 3 mm right to left shift.  No plan for 3% saline at this time and scan was again completed 01/10/2020 showing no new hemorrhage hydrocephalus or worsening mass-effect.  Lovenox was initiated for DVT prophylaxis 01/10/2020.  Patient did have a low-grade fever 100.5 leukocytosis 14,300 improved to 7.0.  Urine cultures no growth blood cultures no growth to date.  Therapy evaluations completed and patient was admitted for a comprehensive rehab program   Hospital Course: Derek [name] was admitted to rehab 01/15/2020 for inpatient therapies to consist of PT, ST and OT at least three hours five days a week. Past admission physiatrist, therapy team and rehab RN have worked together to provide customized collaborative inpatient rehab.  Pertaining to patient's right MCA infarction with hemorrhagic transformation embolic pattern remained stable TEE negative.  Lovenox initiated for DVT prophylaxis 01/10/2020 recent Doppler studies negative patient did remain on aspirin 325 mg daily.  Chronic  back pain Lidoderm patch oxycodone as needed.  His diet was  advanced to regular.  Close monitoring of blood pressure currently off antihypertensive medications.  Lipitor for hyperlipidemia.  He did have a long history of tobacco alcohol marijuana use urine drug screen was positive marijuana he was on a NicoDerm patch as well as receiving counsel regards to cessation of illicit drug products it was questionable if he would be compliant with these request.  Patient with bouts of spasticity hemiparesis left lower extremity baclofen titrated as well as the addition of Zanaflex.   Blood pressures were monitored on TID basis and controlled  He/ is continent of bowel and bladder.  He/ has made gains during rehab stay and is attending therapies  He/ will continue to receive follow up therapies   after discharge  Rehab course: During patient's stay in rehab weekly team conferences were held to monitor patient's progress, set goals and discuss barriers to discharge. At admission, patient required +2 physical assist sit to stand, +2 physical assist stand pivot transfers.  Max assist upper body bathing total assist lower body bathing max assist upper body dressing total assist lower body dressing  Physical exam.  Blood pressure 119/71 pulse 69 temperature 98.7 respiration 21 oxygen saturation 99% room air HEENT Head.  Normocephalic and atraumatic Eyes.  Pupils round and reactive to light no discharge without nystagmus Neck.  Supple nontender no JVD without thyromegaly Cardiac regular rate rhythm without any extra sounds or murmur heard Respiratory effort normal no respiratory distress without wheeze Abdomen.  Soft nontender positive bowel sounds without rebound Musculoskeletal.  Normal range of motion Comments.  Right upper extremity and right lower extremity.  Deltoids biceps triceps wrist extension grip finger abduction hip flexion knee extension knee flexion dorsi flexion plantar flexion 5 out of 5 Left upper extremity left lower extremity patient refused to  try to move apparently 0 out of 5 did not see any spontaneous movement.  He exhibits normal muscle tone.  Coordination abnormal.  He does follow commands fair insight and awareness of his deficits.  He/  has had improvement in activity tolerance, balance, postural control as well as ability to compensate for deficits. He/ has had improvement in functional use RUE/LUE  and RLE/LLE as well as improvement in awareness.  Supine to sitting left edge of bed moderate cues for sequencing to increase patient independence and contact-guard for steadying.  Sitting edge of bed therapist donned bilateral shoes with assistance for time management.  Left squat pivot transfers edge of bed wheelchair with light moderate assist.  Propels his wheelchair distant supervision.  Blocked practice squat pivot transfers car to wheelchair with initially moderate assist for lifting pivoting hips progressed to minimal assist.  Ambulation 51 feet using rolling walker moderate assist.  ADLs patient complete squat pivot transfers with min mod assist in bilateral directions.  Patient able to unbutton pants button and use boxers to use urinal.  Patient was modified independent for recall in order to locate familiar places around the unit during a functional memory scavenger hunt.  Full family teaching completed plan discharge to home       Disposition: Discharge to home    Diet: Regular  Special Instructions: No driving smoking or alcohol  Medications at discharge 1.  Tylenol as needed 2.  Aspirin 325 mg p.o. daily 3.  Lipitor 40 mg p.o. daily 4.  Baclofen 20 mg p.o. 3 times daily 5.  Folic acid 1 mg p.o. daily 6.  Lidoderm patch  change as directed 7.  Multivitamin daily 8.  Oxycodone 5 mg every 6 hours as needed moderate pain 9.  Zanaflex 2 mg p.o. 3 times daily  Discharge Instructions    Ambulatory referral to Neurology   Complete by: As directed    An appointment is requested in approximately 4 weeks right MCA  infarction}   Ambulatory referral to Physical Medicine Rehab   Complete by: As directed    Moderate complexity follow up 1-2 weeks right MCA infarction      Follow-up Information    Ranelle Oyster, MD Follow up.   Specialty: Physical Medicine and Rehabilitation Why: As directed Contact information: [address] [place] [address] [phone]           Signed: Charlton Amor 02/13/2020, 5:12 AM

## 2020-02-11 NOTE — Progress Notes (Signed)
Speech Language Pathology Daily Session Note  Patient Details  Name: Derek [name] MRN: [id number] Date of Birth: 1976/01/01  Today's Date: 02/11/2020 SLP Individual Time: 1030-1055 SLP Individual Time Calculation (min): 25 min  Short Term Goals: Week 4: SLP Short Term Goal 1 (Week 4): STGs=LTGs due to ELOS  Skilled Therapeutic Interventions: Skilled treatment session focused on completion of patient and family education with the patient and his girlfriend. SLP facilitated session by providing education in regards to patient's current cognitive functioning and strategies to utilize at home to maximize attention, problem solving, recall and overall safety with use of compensatory strategies. Handouts were also given to reinforce information. All verbalized understanding. Patient left upright in wheelchair with alarm on and all needs within reach. Continue with current plan of care.      Pain No/Denies Pain   Therapy/Group: Individual Therapy  Christoffer Currier 02/11/2020, 12:29 PM

## 2020-02-11 NOTE — Progress Notes (Signed)
Occupational Therapy Session Note  Patient Details  Name: Derek [name] MRN: [id number] Date of Birth: [birth year]  Today's Date: 02/11/2020 OT Individual Time: 1000-1027 OT Individual Time Calculation (min): 27 min    Short Term Goals: Week 4:  OT Short Term Goal 1 (Week 4): LTG = STG 2/2 ELOS.  Skilled Therapeutic Interventions/Progress Updates:    Upon entering the room, pt seated in wheelchair with no c/o pain and agreeable to OT intervention. Pt propelled wheelchair with hemiplegic technique 100' to day room with supervision. Pt standing at high low table x 4 reps with min A and L knee blocked. Pt standing for card tasks to address standing balance, weight shifting, and L visual scanning. Pt matching several cards while L hand placed into weight bearing and L knee blocked. Pt initially needing mod A for standing balance but progressing to min A as pt stands for 2 minutes and then 8 minutes respectively. Pt returning to wheelchair for seated rest breaks. Pt propelling self back to room with chair alarm belt donned and activated and family member present in room. Call bell and all needed items within reach upon exiting the room.   Therapy Documentation Precautions:  Precautions Precautions: Fall Precaution Comments: L neglect, hemiparesis Required Braces or Orthoses: Other Brace Other Brace: L knee immobilizer Restrictions Weight Bearing Restrictions: No General:   Vital Signs:   Pain:   ADL: ADL Grooming: Minimal assistance Where Assessed-Grooming: Sitting at sink Upper Body Bathing: Moderate assistance Where Assessed-Upper Body Bathing: Edge of bed Lower Body Bathing: Maximal assistance Where Assessed-Lower Body Bathing: Edge of bed Upper Body Dressing: Moderate assistance Where Assessed-Upper Body Dressing: Edge of bed Lower Body Dressing: Maximal assistance Where Assessed-Lower Body Dressing: Edge of bed Toileting: Maximal assistance Where Assessed-Toileting:  Teacher, adult education: Maximal Dentist Method: Surveyor, minerals: Bedside commode   Therapy/Group: Individual Therapy  Alen Bleacher 02/11/2020, 12:26 PM

## 2020-02-11 NOTE — Progress Notes (Signed)
Occupational Therapy Session Note  Patient Details  Name: Derek [name] MRN: [id number] Date of Birth: Dec 22, 1975  Today's Date: 02/11/2020 OT Individual Time: 3818-2993 OT Individual Time Calculation (min): 61 min    Short Term Goals: Week 3:  OT Short Term Goal 1 (Week 3): Pt will use LUE as stabilizer during grooming with Min multimodal cueing. OT Short Term Goal 1 - Progress (Week 3): Progressing toward goal OT Short Term Goal 2 (Week 3): Patient will don LB clothing with Mod A at sit to stand level. OT Short Term Goal 2 - Progress (Week 3): Met OT Short Term Goal 3 (Week 3): Pt will attend to left environment during grooming tasks with 2-3 cues. OT Short Term Goal 3 - Progress (Week 3): Met OT Short Term Goal 4 (Week 3): Patient will complete toilet transfer to [facility] with Mod A. OT Short Term Goal 4 - Progress (Week 3): Met Week 4:  OT Short Term Goal 1 (Week 4): LTG = STG 2/2 ELOS.  Skilled Therapeutic Interventions/Progress Updates:  Patient met lying supine in bed in agreement with OT treatment session. Skilled OT services provided with focus on self-care re-education and therapeutic activity as detailed below. Kpad in place for pain management in shoulder but patient reporting 0/10 pain at rest this date. Patient's significant other not present for family education. Patient completes all functional transfers with Min-Mod A overall this date. Supine to EOB with light Min A and squat-pivot from EOB to wc. Patient able to self propel wc into bathroom with supervision A and Min A over slope to enter. Sit to stand from wc to hemi walker with Min A and toileting in standing with use of urinal with heavy Min A and L knee block to maintain standing balance. Bathing seated on TTB in shower with Min A overall and assistance to maintain figure-4 position with LLE over RLE. Patient demonstrates increased L attention with 0-2 cues for LUE placement throughout treatment session. UB dressing seated at  sink level with supervision A and 1-2 vc's, and LB dressing seated/standing at sink level with Min A overall. Patient able to don R shoe with Min A but required Max A to don L shoe with AFO. Patient left seated in wc with call bell within reach, belt alarm activated, and all needs met.   Therapy Documentation Precautions:  Precautions Precautions: Fall Precaution Comments: L neglect, hemiparesis Required Braces or Orthoses: Other Brace Other Brace: L knee immobilizer Restrictions Weight Bearing Restrictions: No   Therapy/Group: Individual Therapy  [name] 02/11/2020, 7:46 AM

## 2020-02-11 NOTE — Progress Notes (Signed)
[facility] PHYSICAL MEDICINE & REHABILITATION PROGRESS NOTE   Subjective/Complaints: No new problems. Slept funny on left arm and it hurt for awhile last night and this morning. Feels better now. Happy to see some return in his left leg  ROS: Patient denies fever, rash, sore throat, blurred vision, nausea, vomiting, diarrhea, cough, shortness of breath or chest pain,  back pain, headache, or mood change.    Objective:   No results found. No results for input(s): WBC, HGB, HCT, PLT in the last 72 hours. No results for input(s): NA, K, CL, CO2, GLUCOSE, BUN, CREATININE, CALCIUM in the last 72 hours.  Intake/Output Summary (Last 24 hours) at 02/11/2020 1306 Last data filed at 02/11/2020 0900 Gross per 24 hour  Intake 820 ml  Output 1175 ml  Net -355 ml     Physical Exam: Vital Signs Blood pressure 95/66, pulse 79, temperature 97.6 F (36.4 C), resp. rate 18, height 6\' 1"  (1.854 m), weight 91.4 kg, SpO2 96 %.  Constitutional: No distress . Vital signs reviewed. HEENT: EOMI, oral membranes moist Neck: supple Cardiovascular: RRR without murmur. No JVD    Respiratory/Chest: CTA Bilaterally without wheezes or rales. Normal effort    GI/Abdomen: BS +, non-tender, non-distended Ext: no clubbing, cyanosis, or edema Psych: pleasant and cooperative Musc: No edema in extremities.  Left shoulder with 1cm sublux still. Less tender with PROM. Neuro: Alert Motor: LUE/LLE: 1/5 left HF,HE only.  1-2/4 MAS left quads today, gastroc, 1+/4 LUE RUE and RLE 5/5.    Assessment/Plan: 1. Functional deficits secondary to right MCA infarct which require 3+ hours per day of interdisciplinary therapy in a comprehensive inpatient rehab setting.  Physiatrist is providing close team supervision and 24 hour management of active medical problems listed below.  Physiatrist and rehab team continue to assess barriers to discharge/monitor patient progress toward functional and medical goals  Care  Tool:  Bathing    Body parts bathed by patient: Right arm, Left arm, Chest, Abdomen, Front perineal area, Right upper leg, Left upper leg, Left lower leg, Face, Right lower leg, Buttocks   Body parts bathed by helper: Buttocks     Bathing assist Assist Level: Minimal Assistance - Patient > 75%     Upper Body Dressing/Undressing Upper body dressing   What is the patient wearing?: Pull over shirt    Upper body assist Assist Level: Supervision/Verbal cueing    Lower Body Dressing/Undressing Lower body dressing      What is the patient wearing?: Pants, Underwear/pull up     Lower body assist Assist for lower body dressing: Minimal Assistance - Patient > 75%     Toileting Toileting    Toileting assist Assist for toileting: Minimal Assistance - Patient > 75% Assistive Device Comment: In standing with use of urinal and hemi walker   Transfers Chair/bed transfer  Transfers assist     Chair/bed transfer assist level: Minimal Assistance - Patient > 75%     Locomotion Ambulation   Ambulation assist   Ambulation activity did not occur: Safety/medical concerns  Assist level: Moderate Assistance - Patient 50 - 74% Assistive device: Walker-rolling Max distance: 32ft   Walk 10 feet activity   Assist  Walk 10 feet activity did not occur: Safety/medical concerns  Assist level: Moderate Assistance - Patient - 50 - 74% Assistive device: Walker-rolling   Walk 50 feet activity   Assist Walk 50 feet with 2 turns activity did not occur: Safety/medical concerns  Assist level: Moderate Assistance - Patient -  50 - 74% Assistive device: Walker-rolling    Walk 150 feet activity   Assist Walk 150 feet activity did not occur: Safety/medical concerns         Walk 10 feet on uneven surface  activity   Assist Walk 10 feet on uneven surfaces activity did not occur: Safety/medical concerns         Wheelchair     Assist Will patient use wheelchair at  discharge?: Yes Type of Wheelchair: Manual    Wheelchair assist level: Supervision/Verbal cueing Max wheelchair distance: 100'    Wheelchair 50 feet with 2 turns activity    Assist        Assist Level: Supervision/Verbal cueing   Wheelchair 150 feet activity     Assist      Assist Level: Supervision/Verbal cueing   Blood pressure 95/66, pulse 79, temperature 97.6 F (36.4 C), resp. rate 18, height 6\' 1"  (1.854 m), weight 91.4 kg, SpO2 96 %.  Medical Problem List and Plan: 1.  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  Status post negative TEE 01/10/2020  Continue CIR  -Continue PRAFO/WHO at HS    -KI for hamstring tone as needed    -custom AFO per [facility] requested  2.  Antithrombotics: -DVT/anticoagulation: Lovenox initiated 01/10/2020  -recent dopplers negative             -antiplatelet therapy: Aspirin 325 mg daily 3. Pain Management: Lidoderm patch, oxycodone 5 mg every 6 hours as needed  -kpad  Controlled on 4/9, kpad helps shoulder a lot   -needs to keep hemiplegic shoulder elevated, supported while in bed/chair. He's doing a better job with this 4. Mood: Advised emotional support  -neuropsych consult  -pt denies depression              -antipsychotic agents: N/A 5. Neuropsych: This patient is capable of making decisions on his own behalf. 6. Skin/Wound Care: Routine skin checks 7. Fluids/Electrolytes/Nutrition: encourage PO  -continue to monitor  BMP within normal limits on 3/25, stable on 4/7 8.  Dysphagia.  on regular/thins diet   Eating well 9.  HTN:  off BP meds - monitor   4/12 bp's soft with baclofen 10.  Hyperlipidemia: Continue Lipitor 11.  History of tobacco/alcohol/marijuana use.  NicoDerm patch.  reviewed tobacco/drug use in regard to stroke risk 12. Spastic hemiparesis LLE  -fairly maintained at present, leg loosens as he gets moving. Tone helps  somewhat with ambulation at present also  Baclofen 10mg  TID-- 4/11: with increasing LLE stiffness despite stretching.   Increased Baclofen to 15mg  TID monitor for oversedation  Tizanidine 2mg  TID  -orthotics, ROM   -have discussed outpt botox with him   LOS: 27 days A FACE TO [place] 02/11/2020, 1:06 PM

## 2020-02-11 NOTE — Plan of Care (Signed)
  Problem: Consults Goal: RH STROKE PATIENT EDUCATION Description: See Patient Education module for education specifics  Outcome: Progressing   Problem: RH BOWEL ELIMINATION Goal: RH STG MANAGE BOWEL WITH ASSISTANCE Description: STG Manage Bowel with min Assistance. Outcome: Progressing Goal: RH STG MANAGE BOWEL W/MEDICATION W/ASSISTANCE Description: STG Manage Bowel with Medication with min Assistance. Outcome: Progressing   Problem: RH BLADDER ELIMINATION Goal: RH STG MANAGE BLADDER WITH ASSISTANCE Description: STG Manage Bladder With min Assistance Outcome: Progressing   Problem: RH SKIN INTEGRITY Goal: RH STG SKIN FREE OF INFECTION/BREAKDOWN Description: Patients skin will remain free from further breakdown or infection with min assist. Outcome: Progressing   Problem: RH SAFETY Goal: RH STG ADHERE TO SAFETY PRECAUTIONS W/ASSISTANCE/DEVICE Description: STG Adhere to Safety Precautions With min Assistance/Device. Outcome: Progressing   Problem: RH PAIN MANAGEMENT Goal: RH STG PAIN MANAGED AT OR BELOW PT'S PAIN GOAL Description: < 4 Outcome: Progressing   Problem: RH KNOWLEDGE DEFICIT Goal: RH STG INCREASE KNOWLEDGE OF HYPERTENSION Description: Patient/caregiver will verbalize understanding of HTN management including monitoring, diet, exercise, medications, and follow up care with min assist. Outcome: Progressing Goal: RH STG INCREASE KNOWLEDGE OF STROKE PROPHYLAXIS Description: Patient/caregiver will verbalize understanding of stroke management including monitoring, diet, exercise, medications, and follow up care with min assist. Outcome: Progressing

## 2020-02-12 ENCOUNTER — Inpatient Hospital Stay (HOSPITAL_COMMUNITY): Payer: BC Managed Care – PPO | Admitting: Occupational Therapy

## 2020-02-12 ENCOUNTER — Inpatient Hospital Stay (HOSPITAL_COMMUNITY): Payer: BC Managed Care – PPO | Admitting: Physical Therapy

## 2020-02-12 ENCOUNTER — Inpatient Hospital Stay (HOSPITAL_COMMUNITY): Payer: BC Managed Care – PPO | Admitting: Speech Pathology

## 2020-02-12 MED ORDER — TIZANIDINE HCL 2 MG PO TABS: 2.0000 mg | ORAL_TABLET | Freq: Three times a day (TID) | ORAL | 0 refills | Status: DC

## 2020-02-12 MED ORDER — BACLOFEN 20 MG PO TABS
20.0000 mg | ORAL_TABLET | Freq: Three times a day (TID) | ORAL | Status: DC
  Administered 2020-02-12 – 2020-02-13 (×3): 20 mg via ORAL
  Filled 2020-02-12 (×3): qty 1

## 2020-02-12 MED ORDER — OXYCODONE HCL 5 MG PO TABS: 5.0000 mg | ORAL_TABLET | Freq: Four times a day (QID) | ORAL | 0 refills | Status: DC | PRN

## 2020-02-12 MED ORDER — LIVING WELL WITH DIABETES BOOK
Freq: Once | Status: DC
  Filled 2020-02-12: qty 1

## 2020-02-12 MED ORDER — ACETAMINOPHEN 325 MG PO TABS: 650.0000 mg | ORAL_TABLET | ORAL | Status: DC | PRN

## 2020-02-12 MED ORDER — LIDOCAINE 5 % EX PTCH: 1.0000 | MEDICATED_PATCH | CUTANEOUS | 0 refills | Status: DC

## 2020-02-12 MED ORDER — BLOOD PRESSURE CONTROL BOOK
Freq: Once | Status: DC
  Filled 2020-02-12: qty 1

## 2020-02-12 MED ORDER — ATORVASTATIN CALCIUM 40 MG PO TABS: 40.0000 mg | ORAL_TABLET | Freq: Every day | ORAL | 0 refills | Status: DC

## 2020-02-12 MED ORDER — FOLIC ACID 1 MG PO TABS: 1.0000 mg | ORAL_TABLET | Freq: Every day | ORAL | 0 refills | Status: DC

## 2020-02-12 MED ORDER — BACLOFEN 20 MG PO TABS: 20.0000 mg | ORAL_TABLET | Freq: Three times a day (TID) | ORAL | 1 refills | Status: DC

## 2020-02-12 NOTE — Progress Notes (Addendum)
Social Work Patient ID: Derek [name], male   DOB: 11-15-1975, 44 y.o.   MRN: [id number]   Per therapy team, pt wife requested script for w/c ramp. SW informed medical team.   SW spoke with [name]/[facility] [phone]) about HH referral. States unable to accept referral due to OT not being able to start for atleast 1.5 weeks.   SW made efforts to get [facility] services with [facility], however referral declined due to no current ST.  SW submitted referral to [facility] at [facility] [phone]). SW waiting on follow-up. *Referral declined by branch.  Referral declined by [facility] as out of network ([place]/[phone]).  [name]/[facility] [phone]) about HHPT/OT/ST referral. Sw waiting on follow-up.   SW left message for pt s/o [name] to inform on challenges with obtaining HH. SW also informed on script for w/c ramp as requested. SW informed on follow-up once able to secure [facility]   [name], MSW, LCSWA Office: [phone] Cell: [phone] Fax: [phone]

## 2020-02-12 NOTE — Plan of Care (Signed)
  Problem: Consults Goal: RH STROKE PATIENT EDUCATION Description: See Patient Education module for education specifics  Outcome: Progressing   Problem: RH BOWEL ELIMINATION Goal: RH STG MANAGE BOWEL WITH ASSISTANCE Description: STG Manage Bowel with min Assistance. Outcome: Progressing   Problem: RH BLADDER ELIMINATION Goal: RH STG MANAGE BLADDER WITH ASSISTANCE Description: STG Manage Bladder With min Assistance Outcome: Progressing

## 2020-02-12 NOTE — Progress Notes (Signed)
[facility] PHYSICAL MEDICINE & REHABILITATION PROGRESS NOTE   Subjective/Complaints: Same issues, left leg tightness, intermittent left shoulder pain. Somewhat reasonable control of both this morning  ROS: Patient denies fever, rash, sore throat, blurred vision, nausea, vomiting, diarrhea, cough, shortness of breath or chest pain, joint or back pain, headache, or mood change.    Objective:   No results found. No results for input(s): WBC, HGB, HCT, PLT in the last 72 hours. No results for input(s): NA, K, CL, CO2, GLUCOSE, BUN, CREATININE, CALCIUM in the last 72 hours.  Intake/Output Summary (Last 24 hours) at 02/12/2020 1200 Last data filed at 02/12/2020 0900 Gross per 24 hour  Intake 780 ml  Output 1850 ml  Net -1070 ml     Physical Exam: Vital Signs Blood pressure 114/84, pulse 81, temperature 97.7 F (36.5 C), temperature source Oral, resp. rate 18, height 6\' 1"  (1.854 m), weight 91.4 kg, SpO2 97 %.  Constitutional: No distress . Vital signs reviewed. HEENT: EOMI, oral membranes moist Neck: supple Cardiovascular: RRR without murmur. No JVD    Respiratory/Chest: CTA Bilaterally without wheezes or rales. Normal effort    GI/Abdomen: BS +, non-tender, non-distended Ext: no clubbing, cyanosis, or edema Psych: pleasant and cooperative Musc: No edema in extremities.  Left shoulder with ongoing1cm sublux still. minimally tender with PROM. Neuro: Alert Motor: LUE/LLE: 1/5 left HF,HE only, no quads for me.  1-2/4 MAS left quads today, gastroc, 1+/4 LUE RUE and RLE 5/5.    Assessment/Plan: 1. Functional deficits secondary to right MCA infarct which require 3+ hours per day of interdisciplinary therapy in a comprehensive inpatient rehab setting.  Physiatrist is providing close team supervision and 24 hour management of active medical problems listed below.  Physiatrist and rehab team continue to assess barriers to discharge/monitor patient progress toward functional and medical  goals  Care Tool:  Bathing    Body parts bathed by patient: Right arm, Left arm, Chest, Abdomen, Front perineal area, Right upper leg, Left upper leg, Left lower leg, Face, Right lower leg, Buttocks   Body parts bathed by helper: Buttocks     Bathing assist Assist Level: Minimal Assistance - Patient > 75%     Upper Body Dressing/Undressing Upper body dressing   What is the patient wearing?: Pull over shirt    Upper body assist Assist Level: Supervision/Verbal cueing    Lower Body Dressing/Undressing Lower body dressing      What is the patient wearing?: Pants, Underwear/pull up     Lower body assist Assist for lower body dressing: Minimal Assistance - Patient > 75%     Toileting Toileting    Toileting assist Assist for toileting: Minimal Assistance - Patient > 75% Assistive Device Comment: In standing with use of urinal and hemi walker   Transfers Chair/bed transfer  Transfers assist     Chair/bed transfer assist level: Contact Guard/Touching assist     Locomotion Ambulation   Ambulation assist   Ambulation activity did not occur: Safety/medical concerns  Assist level: Moderate Assistance - Patient 50 - 74% Assistive device: Walker-rolling Max distance: 60ft   Walk 10 feet activity   Assist  Walk 10 feet activity did not occur: Safety/medical concerns  Assist level: Moderate Assistance - Patient - 50 - 74% Assistive device: Walker-rolling   Walk 50 feet activity   Assist Walk 50 feet with 2 turns activity did not occur: Safety/medical concerns  Assist level: Moderate Assistance - Patient - 50 - 74% Assistive device: Walker-rolling  Walk 150 feet activity   Assist Walk 150 feet activity did not occur: Safety/medical concerns         Walk 10 feet on uneven surface  activity   Assist Walk 10 feet on uneven surfaces activity did not occur: Safety/medical concerns         Wheelchair     Assist Will patient use wheelchair  at discharge?: Yes Type of Wheelchair: Manual    Wheelchair assist level: Supervision/Verbal cueing Max wheelchair distance: 150'    Wheelchair 50 feet with 2 turns activity    Assist        Assist Level: Supervision/Verbal cueing   Wheelchair 150 feet activity     Assist      Assist Level: Supervision/Verbal cueing   Blood pressure 114/84, pulse 81, temperature 97.7 F (36.5 C), temperature source Oral, resp. rate 18, height 6\' 1"  (1.854 m), weight 91.4 kg, SpO2 97 %.  Medical Problem List and Plan: 1.  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  Status post negative TEE 01/10/2020  Continue CIR  -Continue PRAFO/WHO at HS    -KI for hamstring tone as needed    -custom AFO per [employer]   -team conf today  -ELOS 4/14  -Patient to see me in the office for transitional care encounter in 1-2 weeks.  2.  Antithrombotics: -DVT/anticoagulation: Lovenox initiated 01/10/2020  -recent dopplers negative             -antiplatelet therapy: Aspirin 325 mg daily 3. Pain Management: Lidoderm patch, oxycodone 5 mg every 6 hours as needed  -kpad  Controlled on 4/9, kpad helps shoulder a lot   -needs to keep hemiplegic shoulder elevated, supported while in bed/chair. He's doing a better job with this 4. Mood: Advised emotional support  -neuropsych consult  -pt denies depression              -antipsychotic agents: N/A 5. Neuropsych: This patient is capable of making decisions on his own behalf. 6. Skin/Wound Care: Routine skin checks 7. Fluids/Electrolytes/Nutrition: encourage PO  -continue to monitor  BMP within normal limits on 3/25, stable on 4/7 8.  Dysphagia.  on regular/thins diet   Eating well 9.  HTN:  off BP meds - monitor   4/13 bp's under reasonable control 10.  Hyperlipidemia: Continue Lipitor 11.  History of tobacco/alcohol/marijuana use.  NicoDerm patch.  reviewed  tobacco/drug use in regard to stroke risk 12. Spastic hemiparesis LLE  -fairly maintained at present, leg loosens as he gets moving. Tone helps somewhat with ambulation at present also  Baclofen 10mg  TID-- 4/11: with increasing LLE stiffness despite stretching.   4/13 will try increasing baclofen to 20mg  tid (for tone and ease of administration at home). Can continue as long as he tolerates  Continue Tizanidine 2mg  TID  -orthotics, ROM   -have discussed outpt botox with him   LOS: 28 days A FACE TO FACE EVALUATION WAS PERFORMED  6/11 02/12/2020, 12:00 PM

## 2020-02-12 NOTE — Plan of Care (Signed)
  Problem: RH Functional Use of Upper Extremity Goal: LTG Patient will use RT/LT upper extremity as a (OT) Description: LTG: Patient will use right/left upper extremity as a stabilizer/gross assist/diminished/nondominant/dominant level with assist, with/without cues during functional activity (OT) 02/12/2020 1548 by Turner Daniels R, OT Outcome: Not Met (add Reason) 02/12/2020 1232 by Turner Daniels R, OT Outcome: Not Met (add Reason) 02/12/2020 1231 by Turner Daniels R, OT Flowsheets (Taken 02/12/2020 1231) LTG: Pt will use upper extremity in functional activity with assistance level of: Total Assistance - Patient < 25% Note: Patient unable to actively use LUE in functional activities secondary to flaccidity without activation.  02/12/2020 1229 by Turner Daniels R, OT Outcome: Not Met (add Reason)

## 2020-02-12 NOTE — Progress Notes (Signed)
Physical Therapy Session Note  Patient Details  Name: KOLTON KIENLE MRN: [id number] Date of Birth: 08-22-1976  Today's Date: 02/12/2020 PT Individual Time: 1245-1340 PT Individual Time Calculation (min): 55 min   Short Term Goals: Week 4:  PT Short Term Goal 1 (Week 4): =LTGs due to ELOS  Skilled Therapeutic Interventions/Progress Updates:   Pt received in w/c and agreeable to therapy, denies pain. Pt self-propelled w/c around unit via R hemi technique independently. Pt also independent w/ w/c parts management w/ increased time. Performed d/c testing as detailed in d/c summary including sensation, motor, coordination, and LE testing. Educated pt on LLE tone management including taking medications as prescribed and daily hamstring and gastroc stretching. Discussed importance of PRAFO at night use as pt not wearing AFO at this time. Demonstrated w/c bumping technique to pt w/ 2nd helper. Demonstrated how to direct 2 helpers to assist him with this. Pt more comfortable with this technique now that he was able to experience it. Ambulated 50' x2 w/ RW and LUE orthosis. Pt min assist fading to mod assist w/ fatigue. Manual assist for lateral weight shifting at pelvis, verbal cues for LLE step placement and RW management, tactile cues for posture. No manual assist needed for LLE management. Overall, much improved and more consistent activation of L quad and L hip flexor for more fluid and functional gait pattern. Will continue to recommend pt not ambulate at home unless with a skilled therapist. Returned to room and performed blocked practice of stand pivot transfers to/from EOB w/ CGA-min assist x4 reps. Ended session in w/c, all needs in reach.   Therapy Documentation Precautions:  Precautions Precautions: Fall Precaution Comments: L neglect, hemiparesis Restrictions Weight Bearing Restrictions: No  Therapy/Group: Individual Therapy  Erich Kochan [name] 02/12/2020, 3:48 PM

## 2020-02-12 NOTE — Progress Notes (Signed)
Speech Language Pathology Discharge Summary  Patient Details  Name: Derek [name] MRN: [id number] Date of Birth: 09/20/1976  Today's Date: 02/12/2020 SLP Individual Time: 3833-3832 SLP Individual Time Calculation (min): 55 min   Skilled Therapeutic Interventions:  Skilled treatment session focused on cognitive goals. SLP facilitated session by providing re-administering the Cognistat. Patient scored WFL on all subtests with the exception of visual-construction in which he scored mild improvements. Patient's scores demonstrate cognitive gains since initial evaluation. SLP also provided education in regards to s/s of stroke and stroke risk factors. Patient verbalized understanding. Patient left upright in bed with alarm on and all needs within reach.   Patient has met 5 of 5 long term goals.  Patient to discharge at overall Supervision;Min level.   Reasons goals not met: N/A   Clinical Impression/Discharge Summary: Patient has made excellent gains and has met 5 of 5 LTGs this admission. Currently, patient is consuming regular textures with thin liquids without overt s/s of aspiration and overall Mod I for use of swallowing compensatory strategies. Patient also requires overall supervision-Min A verbal cues to complete functional and familiar tasks safely in regards to selective attention, complex problem solving, recall and awareness. Patient and family education is complete and patient will discharge home with 24 hour supervision from family. Patient would benefit from f/u SLP services to maximize his cognitive functioning and overall functional independence.   Care Partner:  Caregiver Able to Provide Assistance: Yes  Type of Caregiver Assistance: Physical;Cognitive  Recommendation:  Home Health SLP;24 hour supervision/assistance  Rationale for SLP Follow Up: Maximize cognitive function and independence;Reduce caregiver burden   Equipment: N/A   Reasons for discharge: Discharged from  hospital;Treatment goals met   Patient/Family Agrees with Progress Made and Goals Achieved: Yes    Mileydi Milsap 02/12/2020, 6:24 AM

## 2020-02-12 NOTE — Patient Care Conference (Signed)
Inpatient RehabilitationTeam Conference and Plan of Care Update Date: 02/12/2020   Time: 10:10 AM    Patient Name: Derek [name]      Medical Record Number: [id number]  Date of Birth: July 21, 1976 Sex: Male         Room/Bed: 4W15C/4W15C-01 Payor Info: Payor: [employer] [place] / Plan: [employer] COMM PPO / Product Type: *No Product type* /    Admit Date/Time:  01/15/2020  6:30 PM  Primary Diagnosis:  Right middle cerebral artery stroke [facility])  Patient Active Problem List   Diagnosis Date Noted  . Pain   . Spastic hemiparesis ([place])   . History of hypertension   . Dyslipidemia   . 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 Leukocytosis 01/12/2020  . Cerebrovascular accident (CVA) due to thrombosis of right middle cerebral artery ([place])   . Chronic pain syndrome   . Hypokalemia   . Dysphagia, post-stroke   . Acute ischemic right MCA stroke ([place]) 01/07/2020  . Tobacco dependence   . Marijuana dependence ([place])   . Alcohol dependence [facility])     Expected Discharge Date: Expected Discharge Date: 02/13/20  Team Members Present: Physician leading conference: Dr. Alger Simons Care Coodinator Present: Karene Fry, RN, MSN;Christina Kai Levins, [place] Nurse Present: Ellison Carwin, LPN PT Present: Burnard Bunting, PT OT Present: Turner Daniels, OT SLP Present: Weston Anna, SLP PPS Coordinator present : Gunnar Fusi, SLP     Current Status/Progress Goal Weekly Team Focus  Bowel/Bladder   Pt is continent of b/b  Pt to continue being continent of b/b  Toilet as needed   Swallow/Nutrition/ Hydration             ADL's   Patient completes toileting in sitting on TTB with Min A overal. Functional transfers with Min A at best with use of hemi-walker. Bathing tasks with Min A grossly with patient functioning at supervision level for UB dressing and Min A for LB dressing in sitting/standing with use of hemi-walker and L knee block. Continued dense  hemiplegia with no activation noted in LUE.  MIN A-S overall  Family education, NMR, self-care re-education, functional transfers, and community re-integration.   Mobility   min assist transfers, mod-max assist gait w/ RW and LUE orthosis x25', more consistent volitional L knee/hip activation  min assist transfers, mod assist gait  discharge planning, education, progressing gait, LLE NMR   Communication             Safety/Cognition/ Behavioral Observations  Supervision-Min A  Min A  Family education complete, ready for D/C   Pain   Pt has no complaints of pain  Pt will remain pain free until discharge  Keep assessing pain q shift.   Skin   Skin is intact.  To keep skin intact  Assess skin q shift    Rehab Goals Patient on target to meet rehab goals: Yes *See Care Plan and progress notes for long and short-term goals.     Barriers to Discharge  Current Status/Progress Possible Resolutions Date Resolved   Nursing                  PT                    OT                  SLP                SW Other (comments)  Uninisured            Discharge Planning/Teaching Needs:  D/c to home with s/o [name] who will provide assistance to pt. Pt also states his 69 y.o,. son can help him as well.  Family education as recommended by therapy.   Team Discussion: MD on going LLE tone, adjusted meds, L shoulder pain.  RN A+O, cont B/B, BM 4/11, lidocaine lower back, Kpad to L shoulder.  OT met goals, min A transfers, S/min UB ADL, LB ADL min A.  PT fam ed yesterday with SO.  SLP fam ed done.   Revisions to Treatment Plan: N/A     Medical Summary Current Status: ongoing tone LLE>LUE. pain better for the most part. working on oral antispasmodics and titration. AFO for LLE Weekly Focus/Goal: orthotic fit, tone         Continued Need for Acute Rehabilitation Level of Care: The patient requires daily medical management by a physician with specialized training in physical medicine and  rehabilitation for the following reasons: Direction of a multidisciplinary physical rehabilitation program to maximize functional independence : Yes Medical management of patient stability for increased activity during participation in an intensive rehabilitation regime.: Yes Analysis of laboratory values and/or radiology reports with any subsequent need for medication adjustment and/or medical intervention. : Yes   I attest that I was present, lead the team conference, and concur with the assessment and plan of the team.   Retta Diones 02/12/2020, 2:58 PM   Team conference was held via web/ teleconference due to [place] - 19

## 2020-02-12 NOTE — Progress Notes (Signed)
Physical Therapy Discharge Summary  Patient Details  Name: Derek [name] MRN: [id number] Date of Birth: 05-Jul-1976  Today's Date: 02/13/2020 PT Individual Time: 1000-1015 PT Individual Time Calculation (min): 15 min   Pt received in w/c, ready for d/c from hospital. Total assist w/c transport to outside of hospital for time management. Pt's girlfriend [name]) present for d/c and for car transfer education. Practiced w/c<>real car transfer (SUV) with [name] providing min assist for both, appropriately and safely. Verbal reminder from therapist for [name] to hold into pt's pants belt for safety. Pt utilizing [employer] w/ RUE for pivoting steps. Pt also independent w/ BLE management to get in/out of car. Provided pt and [name] on written handout of bed positioning for comfort, written reminders to don L PRAFO and L UE splint at night, and written HEP. HEP already reviewed w/ pt regarding L gastroc and hamstring stretching. Instructed to perform 30 sec holds, 4-5x daily. Also discussed performing L LAQ to continue working on L quad activation, 3x10 reps. Pt is independent w/ this and is able to direct caregiver assistance w/ stretching appropriately. Pt ended session in car, ready for d/c.   Patient has met 10 of 10 long term goals due to improved activity tolerance, improved balance, improved postural control, increased strength, ability to compensate for deficits, functional use of  left lower extremity, improved attention, improved awareness and improved coordination.  Patient to discharge at a wheelchair level Modified Independent, min assist for transfers. Patient's care partner is independent to provide the necessary physical assistance at discharge. See details of family education on 02/11/20 daily progress note.   Reasons goals not met: n/a  Recommendation:  Patient will benefit from ongoing skilled PT services in home health setting to continue to advance safe functional mobility, address ongoing  impairments in postural control, LLE NMR/strength, tone/spasticity, endurance, and functional balance, and minimize fall risk.  Equipment: 18x18 w/c w/ half-lap tray, HW  Reasons for discharge: treatment goals met and discharge from hospital  Patient/family agrees with progress made and goals achieved: Yes  PT Discharge Precautions/Restrictions Precautions Precautions: Fall Precaution Comments: L neglect, hemiparesis Restrictions Weight Bearing Restrictions: No Pain Pain Assessment Pain Scale: 0-10 Pain Score: 0-No pain Vision/Perception  Perception Perception: Within Functional Limits Praxis Praxis: Intact  Cognition Overall Cognitive Status: Within Functional Limits for tasks assessed Arousal/Alertness: Awake/alert Orientation Level: Oriented X4 Attention: Sustained;Alternating;Selective Sustained Attention: Appears intact Sustained Attention Impairment: Verbal basic;Functional basic Selective Attention: Appears intact Selective Attention Impairment: Functional complex Alternating Attention: Impaired Alternating Attention Impairment: Functional basic;Functional complex Memory: Impaired Memory Impairment: Decreased recall of new information Awareness: Appears intact Problem Solving: Impaired Problem Solving Impairment: Functional complex Executive Function: Sequencing Sequencing: Impaired Sequencing Impairment: Functional basic Behaviors: Impulsive Safety/Judgment: Other (comment) Comments: Patient requires cueing for safety awareness with functional transfers. Sensation Sensation Light Touch: Impaired Detail Light Touch Impaired Details: Impaired LLE(LLE diminished sensation and hypersensitive, distal foot > proximal thigh) Hot/Cold: Impaired by gross assessment Proprioception: Impaired by gross assessment Proprioception Impaired Details: Impaired LUE Coordination Gross Motor Movements are Fluid and Coordinated: No Fine Motor Movements are Fluid and  Coordinated: No Coordination and Movement Description: Dense L hemi, UE>LE Motor  Motor Motor: Abnormal postural alignment and control;Abnormal tone;Hemiplegia Motor - Skilled Clinical Observations: L hemiparesis, generalized deconditioning Motor - Discharge Observations: Dense L hemi remains, UE>LE, LLE w/ increased hamstring and quad tone at rest (1/4 MAS) and pt w/ L lean tendency  Mobility Bed Mobility Bed Mobility: Supine to Sit;Rolling Right;Rolling Left;Sit to Supine  Rolling Right: Supervision/verbal cueing Rolling Left: Supervision/Verbal cueing Supine to Sit: Supervision/Verbal cueing Sit to Supine: Supervision/Verbal cueing Transfers Transfers: Stand to Sit;Sit to Stand;Stand Pivot Transfers Sit to Stand: Contact Guard/Touching assist Stand to Sit: Contact Guard/Touching assist Stand Pivot Transfers: Contact Guard/Touching assist  Stand Pivot Transfer Details: Verbal cues for precautions/safety;Tactile cues for posture;Tactile cues for placement Transfer (Assistive device): Other (Comment)(armrests) Locomotion  Gait Ambulation: Yes Gait Assistance: Moderate Assistance - Patient 50-74% Gait Distance (Feet): 25 Feet Assistive device: Rolling walker Gait Assistance Details: Manual facilitation for placement;Manual facilitation for weight shifting;Verbal cues for sequencing;Verbal cues for technique;Verbal cues for safe use of DME/AE;Tactile cues for placement;Tactile cues for posture Gait Assistance Details: needs min-mod manual assist for lateral weight shifting at pelvis and for upright posture Gait Gait: Yes Gait Pattern: Impaired Gait Pattern: Decreased dorsiflexion - left;Decreased weight shift to left;Left hip hike;Left flexed knee in stance;Poor foot clearance - left Gait velocity: decreased Stairs / Additional Locomotion Stairs: Yes Stairs Assistance: Maximal Assistance - Patient 25 - 49% Stair Management Technique: One rail Right Number of Stairs: 8 Height of  Stairs: 3 Wheelchair Mobility Wheelchair Mobility: Yes Wheelchair Assistance: Independent with assistive device Wheelchair Propulsion: Right upper extremity;Right lower extremity Wheelchair Parts Management: Independent Distance: 150'  Trunk/Postural Assessment  Cervical Assessment Cervical Assessment: Within Functional Limits Thoracic Assessment Thoracic Assessment: Within Functional Limits Lumbar Assessment Lumbar Assessment: Within Functional Limits Postural Control Postural Control: Deficits on evaluation Trunk Control: delayed and unable to correct LOB to L  Balance Balance Balance Assessed: Yes Static Sitting Balance Static Sitting - Balance Support: Feet supported;Right upper extremity supported Static Sitting - Level of Assistance: 5: Stand by assistance Dynamic Sitting Balance Dynamic Sitting - Balance Support: Feet supported;No upper extremity supported Dynamic Sitting - Level of Assistance: 5: Stand by assistance Static Standing Balance Static Standing - Balance Support: No upper extremity supported;During functional activity Static Standing - Level of Assistance: 4: Min assist Dynamic Standing Balance Dynamic Standing - Level of Assistance: 3: Mod assist Extremity Assessment  RLE Assessment RLE Assessment: Within Functional Limits LLE Assessment Passive Range of Motion (PROM) Comments: WFL General Strength Comments: Pt w/ 1/5 knee extension and hip flexion, no other movement palpated    Juandedios Dudash K Tin Engram 02/12/2020, 12:18 PM

## 2020-02-12 NOTE — Plan of Care (Signed)
Patient unable to actively incorporate LUE with functional activities secondary to flaccidity.

## 2020-02-12 NOTE — Progress Notes (Addendum)
Occupational Therapy Discharge Summary  Patient Details  Name: Derek [name] MRN: [id number] Date of Birth: 05/05/1976  Today's Date: 02/12/2020 OT Individual Time: 1884-1660 OT Individual Time Calculation (min): 44 min   OT Individual Time: 6301-6010 OT Individual Time Calculation (min): 32 min   Patient has met 12 of 13 long term goals due to improved balance, postural control, ability to compensate for deficits, improved attention and improved awareness. Patient to discharge at [facility] Assist level for BADLs including UB/LB bath/dress, functional transfers, and toileting with use of urinal and seated on commode.  Patient to d/c home with significant other who is currently working from home and is able to provide physical assistance with ADLs/IADLs and functional transfers. Patient's significant other Derek [name] is also able to provide transportation.   Reasons goals not met: Patient unable to actively use LUE in functional tasks secondary to continued LUE flaccidity without activation.  Recommendation:  Patient will benefit from ongoing skilled OT services in home health setting to continue to advance functional skills in the area of BADL and Reduce care partner burden.  Equipment: BSC, TTB  Reasons for discharge: treatment goals met and discharge from hospital  Patient/family agrees with progress made and goals achieved: Yes   Skilled Intervention  Session 1: Patient met seated on commode in bathroom in agreement with OT treatment session with focus on self-care re-education and therapeutic activity as detailed below. Sit to stand from commode with Min A and use of hemi-walker. Patient unable to complete hygiene in sitting requiring assistance in standing after BM. Stand-pivot transfer with Min A and use of hemi walker and stand to sit in wc with CGA and cues for hand placement. Patient completed oral hygiene/grooming seated at sink level with I and use of compensatory strategies.  Patient able to doff and don UB clothing with set-up assist. Session concluded with patient seated in wc with call bell within reach, belt alarm activated, and all needs met. Saebo applied to LUE at deltoid to approximate the shoulder joint and decrease risk of subluxation. Saebo removed after 1 hour unattended. No s/s of skin breakdown.   Saebo Stim One 330 [name] width 35 Hz [name] rate On 8 sec/ off 8 sec Ramp up/ down 2 sec Symmetrical Biphasic wave form  Max intensity 155m at 500 Ohm load Skin in tact at end of 60 min unattended  Session 2: Patient met seated in wc in agreement with OT treatment session. Request to complete session outside with focus on community reintegration and therapeutic activity. Patient able to manipulate wc in hospital hallways, around obstacles, through doorways, and over thresholds with minimal cueing for safety. Seated outside, patient completed 2 sets x10 reps each of self-ROM to facilitate soft tissue elongation and decrease risk of contracture. Patient transported back to room with total A for time management. Dycem provided to prevent LUE from sliding on new lap tray. Session concluded with patient seated in wc with call bell within reach, belt alarm activated and all needs met.   OT Discharge Precautions/Restrictions  Precautions Precautions: Fall Precaution Comments: L neglect, hemiparesis Restrictions Weight Bearing Restrictions: No Pain Pain Assessment Pain Scale: 0-10 Pain Score: 0-No pain ADL ADL Eating: Independent Grooming: Modified independent Where Assessed-Grooming: Sitting at sink Upper Body Bathing: Minimal assistance Where Assessed-Upper Body Bathing: Shower Lower Body Bathing: Minimal cueing, Minimal assistance Where Assessed-Lower Body Bathing: Shower Upper Body Dressing: Supervision/safety Where Assessed-Upper Body Dressing: Chair Lower Body Dressing: Minimal assistance Where Assessed-Lower Body Dressing: Chair Toileting:  Minimal assistance Where Assessed-Toileting: Toilet(w/ BSC over top) Toilet Transfer: Minimal assistance Toilet Transfer Method: Stand pivot(with use of hemi-walker) Toilet Transfer Equipment: Bedside commode Tub/Shower Transfer: Minimal assistance, Minimal cueing Tub/Shower Transfer Method: Stand pivot Tub/Shower Equipment: Transfer tub bench ADL Comments: Please refer above.  Vision Baseline Vision/History: No visual deficits Patient Visual Report: No change from baseline Vision Assessment?: No apparent visual deficits Perception  Perception: Within Functional Limits Praxis Praxis: Intact Cognition Overall Cognitive Status: Within Functional Limits for tasks assessed Arousal/Alertness: Awake/alert Orientation Level: Oriented X4 Attention: Sustained;Alternating;Selective Sustained Attention: Appears intact Sustained Attention Impairment: Verbal basic;Functional basic Selective Attention: Appears intact Selective Attention Impairment: Functional complex Alternating Attention: Impaired Alternating Attention Impairment: Functional basic;Functional complex Memory: Impaired Memory Impairment: Decreased recall of new information Awareness: Appears intact Problem Solving: Impaired Problem Solving Impairment: Functional complex Executive Function: Sequencing Sequencing: Impaired Sequencing Impairment: Functional basic Behaviors: Impulsive Safety/Judgment: Other (comment) Comments: Patient requires cueing for safety awareness with functional transfers. Sensation Sensation Light Touch: Impaired Detail(Patient with some sensation to ventral aspect of central palm. No light touch felt in L shoulder, elbow, wrist or digits on ventral/dorsal aspect.) Light Touch Impaired Details: Impaired LUE Hot/Cold: Impaired by gross assessment Proprioception: Impaired by gross assessment Proprioception Impaired Details: Impaired LUE Coordination Gross Motor Movements are Fluid and Coordinated:  No Fine Motor Movements are Fluid and Coordinated: No Coordination and Movement Description: L hemiparesis Motor  Motor Motor: Abnormal postural alignment and control Motor - Skilled Clinical Observations: L hemiparesis, generalized deconditioning Mobility  Bed Mobility Bed Mobility: Supine to Sit;Rolling Right;Rolling Left;Sit to Supine Rolling Right: Supervision/verbal cueing Rolling Left: Supervision/Verbal cueing Supine to Sit: Supervision/Verbal cueing Sit to Supine: Supervision/Verbal cueing Transfers Sit to Stand: Contact Guard/Touching assist Stand to Sit: Contact Guard/Touching assist  Trunk/Postural Assessment  Cervical Assessment Cervical Assessment: Within Functional Limits Thoracic Assessment Thoracic Assessment: Within Functional Limits Lumbar Assessment Lumbar Assessment: Within Functional Limits Postural Control Postural Control: Deficits on evaluation Trunk Control: delayed and unable to correct LOB to L Righting Reactions: delayed  Balance Balance Balance Assessed: Yes Static Sitting Balance Static Sitting - Balance Support: Feet supported Static Sitting - Level of Assistance: 5: Stand by assistance Dynamic Sitting Balance Dynamic Sitting - Balance Support: Feet supported;No upper extremity supported Dynamic Sitting - Level of Assistance: 5: Stand by assistance Static Standing Balance Static Standing - Balance Support: No upper extremity supported;During functional activity Static Standing - Level of Assistance: 4: Min assist Dynamic Standing Balance Dynamic Standing - Balance Support: Left upper extremity supported(Hemi-walker) Dynamic Standing - Level of Assistance: 3: Mod assist Extremity/Trunk Assessment RUE Assessment RUE Assessment: Within Functional Limits Active Range of Motion (AROM) Comments: WFL from 0-180 degrees General Strength Comments: MMT 5/5 LUE Assessment LUE Assessment: Exceptions to [facility] Passive Range of Motion (PROM) Comments:  WFL 0-180 degrees shoulder flexion. WFL elbow, wrist, and digits 1-5. Active Range of Motion (AROM) Comments: Flaccid General Strength Comments: MMT 0/5 LUE Body System: Neuro Brunstrum levels for arm and hand: Arm;Hand Brunstrum level for arm: Stage I Presynergy Brunstrum level for hand: Stage I Flaccidity   Reggie Welge [name] 02/12/2020, 12:21 PM

## 2020-02-12 NOTE — Plan of Care (Signed)
  Problem: Consults Goal: RH STROKE PATIENT EDUCATION Description: See Patient Education module for education specifics  Outcome: Progressing   Problem: RH BOWEL ELIMINATION Goal: RH STG MANAGE BOWEL WITH ASSISTANCE Description: STG Manage Bowel with min Assistance. Outcome: Progressing Goal: RH STG MANAGE BOWEL W/MEDICATION W/ASSISTANCE Description: STG Manage Bowel with Medication with min Assistance. Outcome: Progressing   Problem: RH BLADDER ELIMINATION Goal: RH STG MANAGE BLADDER WITH ASSISTANCE Description: STG Manage Bladder With min Assistance Outcome: Progressing   Problem: RH SKIN INTEGRITY Goal: RH STG SKIN FREE OF INFECTION/BREAKDOWN Description: Patients skin will remain free from further breakdown or infection with min assist. Outcome: Progressing   Problem: RH SAFETY Goal: RH STG ADHERE TO SAFETY PRECAUTIONS W/ASSISTANCE/DEVICE Description: STG Adhere to Safety Precautions With min Assistance/Device. Outcome: Progressing   Problem: RH PAIN MANAGEMENT Goal: RH STG PAIN MANAGED AT OR BELOW PT'S PAIN GOAL Description: < 4 Outcome: Progressing   Problem: RH KNOWLEDGE DEFICIT Goal: RH STG INCREASE KNOWLEDGE OF HYPERTENSION Description: Patient/caregiver will verbalize understanding of HTN management including monitoring, diet, exercise, medications, and follow up care with min assist. Outcome: Progressing Goal: RH STG INCREASE KNOWLEDGE OF STROKE PROPHYLAXIS Description: Patient/caregiver will verbalize understanding of stroke management including monitoring, diet, exercise, medications, and follow up care with min assist. Outcome: Progressing   

## 2020-02-13 ENCOUNTER — Inpatient Hospital Stay (HOSPITAL_COMMUNITY): Payer: BC Managed Care – PPO | Admitting: Physical Therapy

## 2020-02-13 NOTE — Progress Notes (Signed)
Social Work Patient ID: Derek [name], male   DOB: 04/21/1976, 43 y.o.   MRN: [id number]   SW met with pt and pt s/o Kathy in room to provide updates on challenges with HHA. SW indicated if unable to secure by discharge, will call when able to put in place. SW provided her with script for w/c ramp as requested.   SW followed up with Cory Burnette/[facility] ([phone]) about HHPT/OT/ST referral. Stated branch was unable to accept referral.   Cheryl/[facility] ([phone])- referral declined  Britney/[facility]- referral declined  SW left message for [facility] ([phone]) about referral. SW waiting on follow-up.   SW called [facility] ([phone])- referral declined due to staffing.   Bobbi/[facility] ([phone]) referral declined due to out of network.   SW waiting on follow-up from Carolyn Caldwell/[facility] ([phone]) if able to accept HHPT/OT/ST referral.   Auria Chamberlain, MSW, LCSWA Office: [phone] Cell: [phone] Fax: [phone]  

## 2020-02-13 NOTE — Plan of Care (Signed)
  Problem: Consults Goal: RH STROKE PATIENT EDUCATION Description: See Patient Education module for education specifics  Outcome: Completed/Met   Problem: RH BOWEL ELIMINATION Goal: RH STG MANAGE BOWEL WITH ASSISTANCE Description: STG Manage Bowel with min Assistance. Outcome: Completed/Met Goal: RH STG MANAGE BOWEL W/MEDICATION W/ASSISTANCE Description: STG Manage Bowel with Medication with min Assistance. Outcome: Completed/Met   Problem: RH BLADDER ELIMINATION Goal: RH STG MANAGE BLADDER WITH ASSISTANCE Description: STG Manage Bladder With min Assistance Outcome: Completed/Met   Problem: RH SKIN INTEGRITY Goal: RH STG SKIN FREE OF INFECTION/BREAKDOWN Description: Patients skin will remain free from further breakdown or infection with min assist. Outcome: Completed/Met   Problem: RH SAFETY Goal: RH STG ADHERE TO SAFETY PRECAUTIONS W/ASSISTANCE/DEVICE Description: STG Adhere to Safety Precautions With min Assistance/Device. Outcome: Completed/Met   Problem: RH PAIN MANAGEMENT Goal: RH STG PAIN MANAGED AT OR BELOW PT'S PAIN GOAL Description: < 4 Outcome: Completed/Met   Problem: RH KNOWLEDGE DEFICIT Goal: RH STG INCREASE KNOWLEDGE OF HYPERTENSION Description: Patient/caregiver will verbalize understanding of HTN management including monitoring, diet, exercise, medications, and follow up care with min assist. Outcome: Completed/Met Goal: RH STG INCREASE KNOWLEDGE OF STROKE PROPHYLAXIS Description: Patient/caregiver will verbalize understanding of stroke management including monitoring, diet, exercise, medications, and follow up care with min assist. Outcome: Completed/Met

## 2020-02-13 NOTE — Progress Notes (Signed)
Patient discharge home accompanied by girlfriend. Discharge instructions provided by Inez Pilgrim. No further questions/concerns at time. Kalman Shan, LPN

## 2020-02-13 NOTE — Progress Notes (Signed)
Recreational Therapy Discharge Summary Patient Details  Name: JOSELUIS ALESSIO MRN: [id number] Date of Birth: 09-05-1976 Today's Date: 02/13/2020  Long term goals set: 1  Long term goals met: 1  Comments on progress toward goals: PT has made good progress during LOS and is discharging home today with 24 hour assistance.  TR sessions focused on activity analysis with potential modifications and community reintegration w/c level.  Pt participated in simulated outing to hospital based [employer] at supervision w/c level on indoor level surfaces.  Outing included identification and negotiation of obstacles, accessing public restrooms, and safe mobility.  Goal met.  Reasons for discharge: discharge from hospitalPatient/family agrees with progress made and goals achieved: Yes  Kirin Pastorino 02/13/2020, 11:38 AM

## 2020-02-14 NOTE — Progress Notes (Signed)
Social Work Patient ID: [name], male   DOB: Apr 06, 1976, 44 y.o.   MRN: [id number]    02/14/2020- SW received updates from [place] [facility] [phone]) for HHPT/OT and referral was declined.   SW followed up with [name] [phone]) to inform on challenges with obtaining home health and not able to secure, and SW will follow-up once able to obtain a HHA.   [name], MSW, LCSWA Office: [phone] Cell: [phone] Fax: [phone]

## 2020-02-14 NOTE — Progress Notes (Signed)
Social Work Discharge Note   The overall goal for the admission was met for:   Discharge location: Yes. D/c to home with s/o [name].  Length of Stay: Yes. 29 days.  Discharge activity level: Yes. Minimal Assistance.  Home/community participation: Yes. Limited.   Services provided included: MD, RD, PT, OT, SLP, RN, CM, TR, Pharmacy, Neuropsych and SW  Financial Services: Private Insurance: [place]  Follow-up services arranged: DME: [facility] for w/c with left half lap tray, BSC, TTB, and hemiwalker. Home Health was unable to be secured at time of d/c. Pt s/o [name] is aware there will be follow-up once established.   Comments (or additional information): contact pt s/o [name] [phone]  Patient/Family verbalized understanding of follow-up arrangements: Yes  Individual responsible for coordination of the follow-up plan: Pt will have assistance from s/o [place] with coordinating care needs.   Confirmed correct DME delivered: Rana Snare 02/14/2020    Loralee Pacas, MSW, [place] Office: [phone] Cell: [phone] Fax: [phone]

## 2020-02-18 ENCOUNTER — Telehealth: Payer: Self-pay | Admitting: *Deleted

## 2020-02-18 NOTE — Telephone Encounter (Signed)
Call made to give Mr Brissett his appt to come back to the office and see Jacalyn Lefevre NP and Dr Riley Kill thereafter. Mr Baldinger was asking about the shot (Lovenox).  There was not an order at discharge, only aspirin 325 mg. I need clarification to make sure this is correct since Mr Helget was under the impression he would be getting shot.  Please advise.

## 2020-02-18 NOTE — Telephone Encounter (Signed)
I said we would address that as an outpt. I did not tell him he would be getting it at his first visit.

## 2020-02-18 NOTE — Telephone Encounter (Signed)
I let him know, and that there is still a wait on HH as well.

## 2020-02-21 ENCOUNTER — Telehealth: Payer: Self-pay

## 2020-02-21 NOTE — Telephone Encounter (Signed)
HHA accepted referral as of 4/21: [facility]  SW received  message from the patient s/o [name] who called her insurance to explain the challenges with HH. She states that they told her [facility] was a part of her PPO, and told her to call [facility]. She reports she called the branch and they said they would accept the referral. SW informed Tiffany/[facility] on above. SW sent referral for HHPT/OT/ST.  SW called [name] to discuss HH. SW provided contact information for [employer]. SW informed to expect follow-up within two days to schedule home visit.

## 2020-02-25 ENCOUNTER — Other Ambulatory Visit: Payer: Self-pay | Admitting: *Deleted

## 2020-02-26 DIAGNOSIS — Z87891 Personal history of nicotine dependence: Secondary | ICD-10-CM | POA: Diagnosis not present

## 2020-02-26 DIAGNOSIS — I69259 Hemiplegia and hemiparesis following other nontraumatic intracranial hemorrhage affecting unspecified side: Secondary | ICD-10-CM | POA: Diagnosis not present

## 2020-02-26 DIAGNOSIS — I6930 Unspecified sequelae of cerebral infarction: Secondary | ICD-10-CM | POA: Diagnosis not present

## 2020-02-26 DIAGNOSIS — F1911 Other psychoactive substance abuse, in remission: Secondary | ICD-10-CM | POA: Diagnosis not present

## 2020-02-27 ENCOUNTER — Encounter: Payer: BC Managed Care – PPO | Attending: Registered Nurse | Admitting: Registered Nurse

## 2020-02-27 ENCOUNTER — Telehealth: Payer: Self-pay

## 2020-02-27 ENCOUNTER — Other Ambulatory Visit: Payer: Self-pay

## 2020-02-27 ENCOUNTER — Encounter: Payer: Self-pay | Admitting: Registered Nurse

## 2020-02-27 VITALS — BP 138/91 | HR 92 | Ht 71.0 in | Wt 220.0 lb

## 2020-02-27 DIAGNOSIS — F17200 Nicotine dependence, unspecified, uncomplicated: Secondary | ICD-10-CM

## 2020-02-27 DIAGNOSIS — Z8679 Personal history of other diseases of the circulatory system: Secondary | ICD-10-CM | POA: Diagnosis not present

## 2020-02-27 DIAGNOSIS — F1220 Cannabis dependence, uncomplicated: Secondary | ICD-10-CM | POA: Insufficient documentation

## 2020-02-27 DIAGNOSIS — I63511 Cerebral infarction due to unspecified occlusion or stenosis of right middle cerebral artery: Secondary | ICD-10-CM | POA: Insufficient documentation

## 2020-02-27 DIAGNOSIS — E785 Hyperlipidemia, unspecified: Secondary | ICD-10-CM | POA: Insufficient documentation

## 2020-02-27 DIAGNOSIS — G8110 Spastic hemiplegia affecting unspecified side: Secondary | ICD-10-CM | POA: Diagnosis not present

## 2020-02-27 MED ORDER — OXYCODONE HCL 5 MG PO TABS: 5.0000 mg | ORAL_TABLET | Freq: Every day | ORAL | 0 refills | Status: DC | PRN

## 2020-02-27 MED ORDER — TIZANIDINE HCL 2 MG PO TABS: 2.0000 mg | ORAL_TABLET | Freq: Three times a day (TID) | ORAL | 1 refills | Status: DC

## 2020-02-27 NOTE — Telephone Encounter (Signed)
Patient and Domestic partner asked to Manufacturing engineer and Providers about DC  Rehab and failure of current system- sending email to [facility] and Dr. Riley Kill

## 2020-02-27 NOTE — Telephone Encounter (Signed)
Entered in error

## 2020-02-27 NOTE — Progress Notes (Signed)
Subjective:    Patient ID: Derek [name], male    DOB: [birth year], 44 y.o.   MRN: [id number]  HPI: Derek [name] is a 44 y.o. male who is here for HFU appointment in follow up of his Right Middle Cerebral Artery Stroke, Spastic Hemiparesis, History of Hypertension, Dyslipidemia, Tobacco Dependence and Marijuana Dependence. He was brought to [facility] on 01/07/2020 via EMS, Code Stroke. Neurology Consulted.  CT Head WO Contrast:  IMPRESSION: 1. Recent M1 segment, right middle cerebral artery infarct, with mass effect and possible petechial hemorrhage in portions of the right basal ganglia. Infarct extends to the posterosuperior right frontal and adjacent right parietal lobes. Suspect thrombus/embolus in the M1 segment of right middle cerebral artery. 2. No other acute or recent intracranial abnormality.  MR Angio Head WO Contrast:  IMPRESSION: Acute right MCA territory involving basal ganglia and right parietal lobe. There is hemorrhagic transformation of the right basal ganglia infarct with mass-effect on the lateral ventricle.  Right M1 segment widely patent. Moderate stenosis right M2 segment anteriorly. Presumably the thrombus has recanalized given the symptoms are greater than 48 hours old.  He was maintained on ASA 325 mg for CVA Prophylaxis.   CT ANGIO Neck W or WO Contrast: On 01/08/2020 IMPRESSION: No large vessel occlusion or hemodynamically significant stenosis  CT Head WO Contrast: IMPRESSION: 1. Evolving right cerebral infarct with evidence for hemorrhagic transformation at the right basal ganglia, similar in appearance and distribution as compared to previous MRI. Associated mass effect with 3 mm of right-to-left shift. No hydrocephalus or ventricular trapping. 2. No other new acute intracranial abnormality.  He was admitted to inpatient Rehabilitation on 01/15/2020 and discharged home on 02/13/2020. Ms. [name] Derek [name] significant other reports  difficulty with [facility] being set up, [name] LCSW note was reviewed. [facility] was contacted yesterday, she was instructed to call office in the morning if [facility] doesn't call her by this evening. She verbalizes understanding. They brought his hemi-walker but he hasn't really used it, we will walk with Derek [name] on his next appointment. They verbalize understanding. Derek [name] reports left arm and left hand pain he rates his pain 6. Also reports he has a good appetite.   Educated Derek [name] on smoking cessation and cessation of marijuana, he verbalize understanding. Ms. [name] reports he is decreasing his use of the above, he was educated on the narcotic policy. Ms. [name] dispensing the Oxycodone at [facility] only, he realizes if continues to use marijuana Oxycodone will not be prescribe, they verbalize understanding.   Derek [name] Morphine equivalent is 30.00  MME.  Educated on the [employer]. He verbalizes understanding. We will obtain UDS and contract  next month if Dr Riley Kill decides to continue to prescribe Oxycodone.   Pain Inventory Average Pain 7 Pain Right Now 6 My pain is sharp  In the last 24 hours, has pain interfered with the following? General activity 4 Relation with others 0 Enjoyment of life 4 What TIME of day is your pain at its worst? morning daytime and night Sleep (in general) Good  Pain is worse with: sitting and inactivity Pain improves with: rest Relief from Meds: 4  Mobility walk with assistance ability to climb steps?  no do you drive?  no use a wheelchair needs help with transfers  Function not employed: date last employed 01/07/2020 I need assistance with the following:  dressing, bathing, toileting, meal prep, household duties and shopping  Neuro/Psych weakness  Prior Studies transition care  Physicians involved in your care transition care   Family History  Problem Relation Age of Onset  . Stroke Neg Hx   . CAD Neg Hx    Social  History   Socioeconomic History  . Marital status: Single    Spouse name: Not on file  . Number of children: Not on file  . Years of education: Not on file  . Highest education level: Not on file  Occupational History  . Occupation: truck Geophysicist/field seismologist  Tobacco Use  . Smoking status: Current Every Day Smoker    Packs/day: 0.20    Types: Cigarettes  . Smokeless tobacco: Never Used  Substance and Sexual Activity  . Alcohol use: Yes    Comment: 2-3 times a week.   . Drug use: Yes    Types: Marijuana  . Sexual activity: Yes    Birth control/protection: Condom  Other Topics Concern  . Not on file  Social History Narrative  . Not on file   Social Determinants of Health   Financial Resource Strain:   . Difficulty of Paying Living Expenses:   Food Insecurity:   . Worried About Charity fundraiser in the Last Year:   . Arboriculturist in the Last Year:   Transportation Needs:   . Film/video editor (Medical):   Marland Kitchen Lack of Transportation (Non-Medical):   Physical Activity:   . Days of Exercise per Week:   . Minutes of Exercise per Session:   Stress:   . Feeling of Stress :   Social Connections:   . Frequency of Communication with Friends and Family:   . Frequency of Social Gatherings with Friends and Family:   . Attends Religious Services:   . Active Member of Clubs or Organizations:   . Attends Archivist Meetings:   Marland Kitchen Marital Status:    Past Surgical History:  Procedure Laterality Date  . BUBBLE STUDY  01/10/2020   Procedure: BUBBLE STUDY;  Surgeon: Geralynn Rile, MD;  Location: [place];  Service: Cardiovascular;;  . TEE WITHOUT CARDIOVERSION N/A 01/10/2020   Procedure: TRANSESOPHAGEAL ECHOCARDIOGRAM (TEE);  Surgeon: Geralynn Rile, MD;  Location: [place];  Service: Cardiovascular;  Laterality: N/A;   Past Medical History:  Diagnosis Date  . Acute ischemic right MCA stroke ([place]) 01/07/2020  . Alcohol dependence ([place])   . Back pain   .  Marijuana dependence ([place])   . Tobacco dependence    BP (!) 138/91   Pulse 92   Ht 5\' 11"  (1.803 m)   Wt 220 lb (99.8 kg)   SpO2 93%   BMI 30.68 kg/m   Opioid Risk Score:   Fall Risk Score:  `1  Depression screen PHQ 2/9  Depression screen PHQ 2/9 02/27/2020  Decreased Interest 0  Down, Depressed, Hopeless 0  PHQ - 2 Score 0  Altered sleeping 0  Tired, decreased energy 1  Change in appetite 0  Feeling bad or failure about yourself  0  Trouble concentrating 0  Moving slowly or fidgety/restless 0  Suicidal thoughts 0  PHQ-9 Score 1  Difficult doing work/chores Somewhat difficult    Review of Systems  Constitutional: Positive for chills. Negative for diaphoresis.  Neurological: Positive for weakness.  All other systems reviewed and are negative.      Objective:   Physical Exam Vitals and nursing note reviewed.  Constitutional:      Appearance: Normal appearance.  Cardiovascular:     Rate  and Rhythm: Normal rate and regular rhythm.     Pulses: Normal pulses.     Heart sounds: Normal heart sounds.  Pulmonary:     Effort: Pulmonary effort is normal.     Breath sounds: Normal breath sounds.  Musculoskeletal:     Cervical back: Normal range of motion and neck supple.     Comments: Normal Muscle Bulk and Muscle Testing Reveals:  Upper Extremities: Right: Full ROM and Muscle Strength 5/5 Left: Paralysis Lower Extremities: Right: Full ROM and Muscle Strength 5/5 Left: Paralysis Arrived in wheelchair   Skin:    General: Skin is warm and dry.  Neurological:     Mental Status: He is alert and oriented to person, place, and time.  Psychiatric:        Mood and Affect: Mood normal.        Behavior: Behavior normal.           Assessment & Plan:  1.Right Middle Cerebral Artery Stroke: Awaiting Home [facility] with [facility]. Continue HEP as Tolerated. Has an Appointment with Neurology.  2. Spastic Hemiparesis: Continue Baclofen and Tizanidine. 3.History of  Hypertension: PCP Following. Continue to Monitor.  4.Dyslipidemia: Continue current medication regimen. PCP Following.  5.Tobacco Dependence and Marijuana Dependence. Educated on Smoking Cessation and Illicit Drug Use: He and Ms. Val Eagle understanding.  6. Chronic Back Pain: Refilled Oxycodone 5 mg daily as needed for pain #30. Continue to Monitor.  We will continue the opioid monitoring program, this consists of regular clinic visits, examinations, urine drug screen, pill counts as well as use of [place] Controlled Substance Reporting system.  F/U in in 4-6 weeks with Dr Riley Kill.

## 2020-02-27 NOTE — Telephone Encounter (Signed)
PA done for Oxycodone

## 2020-02-27 NOTE — Telephone Encounter (Signed)
What is this in reference to?

## 2020-02-28 ENCOUNTER — Telehealth: Payer: Self-pay

## 2020-02-28 NOTE — Telephone Encounter (Signed)
[name] girlfriend of the patient called and said they have not heard from [facility].

## 2020-02-28 NOTE — Telephone Encounter (Signed)
This provider placed a call to [facility]: Spoke with Development worker, international aid: She reports someone was supposed to see Derek [name] today and wasn't able to reach him. I asked her to have the therapist call Derek [name] ( s/o) she verbalizes understanding. They will be calling Derek [name] this evening to schedule an appointment for 02/29/2020. Placed a call to Derek [name], she verbalizes understanding.

## 2020-02-29 ENCOUNTER — Telehealth: Payer: Self-pay | Admitting: Radiology

## 2020-02-29 DIAGNOSIS — I69354 Hemiplegia and hemiparesis following cerebral infarction affecting left non-dominant side: Secondary | ICD-10-CM | POA: Diagnosis not present

## 2020-02-29 DIAGNOSIS — Z7982 Long term (current) use of aspirin: Secondary | ICD-10-CM | POA: Diagnosis not present

## 2020-02-29 DIAGNOSIS — I69328 Other speech and language deficits following cerebral infarction: Secondary | ICD-10-CM | POA: Diagnosis not present

## 2020-02-29 DIAGNOSIS — G894 Chronic pain syndrome: Secondary | ICD-10-CM | POA: Diagnosis not present

## 2020-02-29 DIAGNOSIS — I10 Essential (primary) hypertension: Secondary | ICD-10-CM | POA: Diagnosis not present

## 2020-02-29 DIAGNOSIS — M545 Low back pain: Secondary | ICD-10-CM | POA: Diagnosis not present

## 2020-02-29 DIAGNOSIS — E785 Hyperlipidemia, unspecified: Secondary | ICD-10-CM | POA: Diagnosis not present

## 2020-02-29 DIAGNOSIS — F17210 Nicotine dependence, cigarettes, uncomplicated: Secondary | ICD-10-CM | POA: Diagnosis not present

## 2020-02-29 DIAGNOSIS — Z9181 History of falling: Secondary | ICD-10-CM | POA: Diagnosis not present

## 2020-02-29 DIAGNOSIS — R1312 Dysphagia, oropharyngeal phase: Secondary | ICD-10-CM | POA: Diagnosis not present

## 2020-02-29 DIAGNOSIS — I051 Rheumatic mitral insufficiency: Secondary | ICD-10-CM | POA: Diagnosis not present

## 2020-02-29 DIAGNOSIS — I69391 Dysphagia following cerebral infarction: Secondary | ICD-10-CM | POA: Diagnosis not present

## 2020-02-29 DIAGNOSIS — I69392 Facial weakness following cerebral infarction: Secondary | ICD-10-CM | POA: Diagnosis not present

## 2020-02-29 NOTE — Telephone Encounter (Signed)
Oxycodone approved.

## 2020-02-29 NOTE — Telephone Encounter (Signed)
Enrolled patient for a 30 day [employer] Event monitor to be mailed to patients home. Spoke with girlfriend and verified address and insurance. Brief instructions were gone over them and they know to expect the monitor to arrive in 5-7 days.

## 2020-03-03 ENCOUNTER — Telehealth: Payer: Self-pay

## 2020-03-03 NOTE — Telephone Encounter (Signed)
Sound good. Remind me to write an AFO script Wednesday

## 2020-03-03 NOTE — Telephone Encounter (Signed)
Byrd Hesselbach, PT form [facility] called requesting verbal orders for HHPT 1wk1, 2wk1, 1wk7. Also a brace for foot drop.

## 2020-03-04 ENCOUNTER — Telehealth: Payer: Self-pay

## 2020-03-04 ENCOUNTER — Other Ambulatory Visit: Payer: Self-pay | Admitting: *Deleted

## 2020-03-04 ENCOUNTER — Telehealth: Payer: Self-pay | Admitting: *Deleted

## 2020-03-04 DIAGNOSIS — G894 Chronic pain syndrome: Secondary | ICD-10-CM | POA: Diagnosis not present

## 2020-03-04 DIAGNOSIS — I639 Cerebral infarction, unspecified: Secondary | ICD-10-CM

## 2020-03-04 DIAGNOSIS — I69354 Hemiplegia and hemiparesis following cerebral infarction affecting left non-dominant side: Secondary | ICD-10-CM | POA: Diagnosis not present

## 2020-03-04 DIAGNOSIS — I4891 Unspecified atrial fibrillation: Secondary | ICD-10-CM

## 2020-03-04 DIAGNOSIS — I051 Rheumatic mitral insufficiency: Secondary | ICD-10-CM | POA: Diagnosis not present

## 2020-03-04 DIAGNOSIS — E785 Hyperlipidemia, unspecified: Secondary | ICD-10-CM | POA: Diagnosis not present

## 2020-03-04 DIAGNOSIS — I10 Essential (primary) hypertension: Secondary | ICD-10-CM | POA: Diagnosis not present

## 2020-03-04 DIAGNOSIS — J019 Acute sinusitis, unspecified: Secondary | ICD-10-CM | POA: Diagnosis not present

## 2020-03-04 DIAGNOSIS — R1312 Dysphagia, oropharyngeal phase: Secondary | ICD-10-CM | POA: Diagnosis not present

## 2020-03-04 DIAGNOSIS — I69391 Dysphagia following cerebral infarction: Secondary | ICD-10-CM | POA: Diagnosis not present

## 2020-03-04 DIAGNOSIS — I69328 Other speech and language deficits following cerebral infarction: Secondary | ICD-10-CM | POA: Diagnosis not present

## 2020-03-04 DIAGNOSIS — Z9181 History of falling: Secondary | ICD-10-CM | POA: Diagnosis not present

## 2020-03-04 DIAGNOSIS — I69392 Facial weakness following cerebral infarction: Secondary | ICD-10-CM | POA: Diagnosis not present

## 2020-03-04 DIAGNOSIS — R05 Cough: Secondary | ICD-10-CM | POA: Diagnosis not present

## 2020-03-04 DIAGNOSIS — M545 Low back pain: Secondary | ICD-10-CM | POA: Diagnosis not present

## 2020-03-04 DIAGNOSIS — Z7982 Long term (current) use of aspirin: Secondary | ICD-10-CM | POA: Diagnosis not present

## 2020-03-04 DIAGNOSIS — F17210 Nicotine dependence, cigarettes, uncomplicated: Secondary | ICD-10-CM | POA: Diagnosis not present

## 2020-03-04 MED ORDER — METOPROLOL TARTRATE 25 MG PO TABS: 25.0000 mg | ORAL_TABLET | Freq: Two times a day (BID) | ORAL | 11 refills | Status: DC

## 2020-03-04 NOTE — Telephone Encounter (Signed)
Byrd Hesselbach PT notified.

## 2020-03-04 NOTE — Telephone Encounter (Signed)
Mallory, OT from [facility] called stating patient P: 130 and BP: 142/108 today. Headache for the past 2 days.

## 2020-03-04 NOTE — Telephone Encounter (Signed)
I sent in rx for metoprolol 25mg  bid to his pharmacy which should help address both. Please make sure someone is following up his bp/hr over the next couple days.  thanks

## 2020-03-04 NOTE — Telephone Encounter (Signed)
Please call [employer] at [phone] to confirm shipping address for cardiac event monitor.

## 2020-03-04 NOTE — Telephone Encounter (Signed)
Mallory OT called back and she says that his BP and HR came down after session--BP was 128/80 and HR was 110.  She requested 2wk4 frequency for his OT and I gave her the ok. She is asking if it will be ok for her to tell the family to get him a sling for his left arm, there is still no movement. Insurance will not pay so they can get one OTC to keep it from moving around. I called and spoke with his girlfriend [name] and they checked his BP and it was 128/88.  I told her about the metoprolol order, but advised her I would send the lower pressures to you and we will let her know tomorrow if she should begin the metoprolol. Please advise. If she needs to be called back this evening because you want them to start the medication the # is [phone]

## 2020-03-05 NOTE — Telephone Encounter (Signed)
Attempted to reach Derek [name] or his girlfriend Derek [name]. I left a detailed message for him to take the metoprolol and monitor his pulse and BP's and to notify if systolic BP <100 or pulse rate less than 55-60. I will notify Mallory OT as well.

## 2020-03-05 NOTE — Telephone Encounter (Signed)
Go ahead and start medication.  otc sling is fine

## 2020-03-06 DIAGNOSIS — I10 Essential (primary) hypertension: Secondary | ICD-10-CM | POA: Diagnosis not present

## 2020-03-06 DIAGNOSIS — I69392 Facial weakness following cerebral infarction: Secondary | ICD-10-CM | POA: Diagnosis not present

## 2020-03-06 DIAGNOSIS — R1312 Dysphagia, oropharyngeal phase: Secondary | ICD-10-CM | POA: Diagnosis not present

## 2020-03-06 DIAGNOSIS — M545 Low back pain: Secondary | ICD-10-CM | POA: Diagnosis not present

## 2020-03-06 DIAGNOSIS — I69354 Hemiplegia and hemiparesis following cerebral infarction affecting left non-dominant side: Secondary | ICD-10-CM | POA: Diagnosis not present

## 2020-03-06 DIAGNOSIS — E785 Hyperlipidemia, unspecified: Secondary | ICD-10-CM | POA: Diagnosis not present

## 2020-03-06 DIAGNOSIS — Z9181 History of falling: Secondary | ICD-10-CM | POA: Diagnosis not present

## 2020-03-06 DIAGNOSIS — I69328 Other speech and language deficits following cerebral infarction: Secondary | ICD-10-CM | POA: Diagnosis not present

## 2020-03-06 DIAGNOSIS — Z7982 Long term (current) use of aspirin: Secondary | ICD-10-CM | POA: Diagnosis not present

## 2020-03-06 DIAGNOSIS — G894 Chronic pain syndrome: Secondary | ICD-10-CM | POA: Diagnosis not present

## 2020-03-06 DIAGNOSIS — I69391 Dysphagia following cerebral infarction: Secondary | ICD-10-CM | POA: Diagnosis not present

## 2020-03-06 DIAGNOSIS — I051 Rheumatic mitral insufficiency: Secondary | ICD-10-CM | POA: Diagnosis not present

## 2020-03-06 DIAGNOSIS — F17210 Nicotine dependence, cigarettes, uncomplicated: Secondary | ICD-10-CM | POA: Diagnosis not present

## 2020-03-06 NOTE — Telephone Encounter (Signed)
Discussed with both MD and Director of Rehab in person

## 2020-03-09 DIAGNOSIS — I63511 Cerebral infarction due to unspecified occlusion or stenosis of right middle cerebral artery: Secondary | ICD-10-CM | POA: Diagnosis not present

## 2020-03-11 DIAGNOSIS — I10 Essential (primary) hypertension: Secondary | ICD-10-CM | POA: Diagnosis not present

## 2020-03-11 DIAGNOSIS — I69392 Facial weakness following cerebral infarction: Secondary | ICD-10-CM | POA: Diagnosis not present

## 2020-03-11 DIAGNOSIS — F17210 Nicotine dependence, cigarettes, uncomplicated: Secondary | ICD-10-CM | POA: Diagnosis not present

## 2020-03-11 DIAGNOSIS — E785 Hyperlipidemia, unspecified: Secondary | ICD-10-CM | POA: Diagnosis not present

## 2020-03-11 DIAGNOSIS — I69354 Hemiplegia and hemiparesis following cerebral infarction affecting left non-dominant side: Secondary | ICD-10-CM | POA: Diagnosis not present

## 2020-03-11 DIAGNOSIS — G894 Chronic pain syndrome: Secondary | ICD-10-CM | POA: Diagnosis not present

## 2020-03-11 DIAGNOSIS — R1312 Dysphagia, oropharyngeal phase: Secondary | ICD-10-CM | POA: Diagnosis not present

## 2020-03-11 DIAGNOSIS — I051 Rheumatic mitral insufficiency: Secondary | ICD-10-CM | POA: Diagnosis not present

## 2020-03-11 DIAGNOSIS — M545 Low back pain: Secondary | ICD-10-CM | POA: Diagnosis not present

## 2020-03-11 DIAGNOSIS — Z9181 History of falling: Secondary | ICD-10-CM | POA: Diagnosis not present

## 2020-03-11 DIAGNOSIS — Z7982 Long term (current) use of aspirin: Secondary | ICD-10-CM | POA: Diagnosis not present

## 2020-03-11 DIAGNOSIS — I69328 Other speech and language deficits following cerebral infarction: Secondary | ICD-10-CM | POA: Diagnosis not present

## 2020-03-11 DIAGNOSIS — I69391 Dysphagia following cerebral infarction: Secondary | ICD-10-CM | POA: Diagnosis not present

## 2020-03-12 ENCOUNTER — Telehealth: Payer: Self-pay | Admitting: *Deleted

## 2020-03-12 DIAGNOSIS — I69392 Facial weakness following cerebral infarction: Secondary | ICD-10-CM | POA: Diagnosis not present

## 2020-03-12 DIAGNOSIS — Z9181 History of falling: Secondary | ICD-10-CM | POA: Diagnosis not present

## 2020-03-12 DIAGNOSIS — G894 Chronic pain syndrome: Secondary | ICD-10-CM | POA: Diagnosis not present

## 2020-03-12 DIAGNOSIS — I69328 Other speech and language deficits following cerebral infarction: Secondary | ICD-10-CM | POA: Diagnosis not present

## 2020-03-12 DIAGNOSIS — F17210 Nicotine dependence, cigarettes, uncomplicated: Secondary | ICD-10-CM | POA: Diagnosis not present

## 2020-03-12 DIAGNOSIS — R1312 Dysphagia, oropharyngeal phase: Secondary | ICD-10-CM | POA: Diagnosis not present

## 2020-03-12 DIAGNOSIS — M545 Low back pain: Secondary | ICD-10-CM | POA: Diagnosis not present

## 2020-03-12 DIAGNOSIS — E785 Hyperlipidemia, unspecified: Secondary | ICD-10-CM | POA: Diagnosis not present

## 2020-03-12 DIAGNOSIS — I051 Rheumatic mitral insufficiency: Secondary | ICD-10-CM | POA: Diagnosis not present

## 2020-03-12 DIAGNOSIS — I10 Essential (primary) hypertension: Secondary | ICD-10-CM | POA: Diagnosis not present

## 2020-03-12 DIAGNOSIS — Z7982 Long term (current) use of aspirin: Secondary | ICD-10-CM | POA: Diagnosis not present

## 2020-03-12 DIAGNOSIS — I69391 Dysphagia following cerebral infarction: Secondary | ICD-10-CM | POA: Diagnosis not present

## 2020-03-12 DIAGNOSIS — I69354 Hemiplegia and hemiparesis following cerebral infarction affecting left non-dominant side: Secondary | ICD-10-CM | POA: Diagnosis not present

## 2020-03-12 NOTE — Telephone Encounter (Signed)
Delice Lesch, Nurse case manager, [facility] left a message that she received a call from the patient requesting a refill on his oxycodone. She states patient complained about his pain.  Nurse advised patient that oxycodone is really not meant for nerve pain as he described. Patient complains that pain is preventing him from to doing his home exercise program.

## 2020-03-12 NOTE — Telephone Encounter (Signed)
Attempted to contact patient, LVM to call back and give details on his pain, locations, intensity, ie.Marland KitchenMarland KitchenMarland Kitchen

## 2020-03-12 NOTE — Telephone Encounter (Signed)
Pt needs to reach out to [place] about his pain location, quality and severity before we change or refill meds.

## 2020-03-13 DIAGNOSIS — E785 Hyperlipidemia, unspecified: Secondary | ICD-10-CM | POA: Diagnosis not present

## 2020-03-13 DIAGNOSIS — R1312 Dysphagia, oropharyngeal phase: Secondary | ICD-10-CM | POA: Diagnosis not present

## 2020-03-13 DIAGNOSIS — I69354 Hemiplegia and hemiparesis following cerebral infarction affecting left non-dominant side: Secondary | ICD-10-CM | POA: Diagnosis not present

## 2020-03-13 DIAGNOSIS — Z7982 Long term (current) use of aspirin: Secondary | ICD-10-CM | POA: Diagnosis not present

## 2020-03-13 DIAGNOSIS — I69391 Dysphagia following cerebral infarction: Secondary | ICD-10-CM | POA: Diagnosis not present

## 2020-03-13 DIAGNOSIS — I051 Rheumatic mitral insufficiency: Secondary | ICD-10-CM | POA: Diagnosis not present

## 2020-03-13 DIAGNOSIS — I69392 Facial weakness following cerebral infarction: Secondary | ICD-10-CM | POA: Diagnosis not present

## 2020-03-13 DIAGNOSIS — I10 Essential (primary) hypertension: Secondary | ICD-10-CM | POA: Diagnosis not present

## 2020-03-13 DIAGNOSIS — Z9181 History of falling: Secondary | ICD-10-CM | POA: Diagnosis not present

## 2020-03-13 DIAGNOSIS — F17210 Nicotine dependence, cigarettes, uncomplicated: Secondary | ICD-10-CM | POA: Diagnosis not present

## 2020-03-13 DIAGNOSIS — I69328 Other speech and language deficits following cerebral infarction: Secondary | ICD-10-CM | POA: Diagnosis not present

## 2020-03-13 DIAGNOSIS — G894 Chronic pain syndrome: Secondary | ICD-10-CM | POA: Diagnosis not present

## 2020-03-13 DIAGNOSIS — M545 Low back pain: Secondary | ICD-10-CM | POA: Diagnosis not present

## 2020-03-17 DIAGNOSIS — G894 Chronic pain syndrome: Secondary | ICD-10-CM | POA: Diagnosis not present

## 2020-03-17 DIAGNOSIS — I69392 Facial weakness following cerebral infarction: Secondary | ICD-10-CM | POA: Diagnosis not present

## 2020-03-17 DIAGNOSIS — R1312 Dysphagia, oropharyngeal phase: Secondary | ICD-10-CM | POA: Diagnosis not present

## 2020-03-17 DIAGNOSIS — M545 Low back pain: Secondary | ICD-10-CM | POA: Diagnosis not present

## 2020-03-17 DIAGNOSIS — I10 Essential (primary) hypertension: Secondary | ICD-10-CM | POA: Diagnosis not present

## 2020-03-17 DIAGNOSIS — I69328 Other speech and language deficits following cerebral infarction: Secondary | ICD-10-CM | POA: Diagnosis not present

## 2020-03-17 DIAGNOSIS — I69354 Hemiplegia and hemiparesis following cerebral infarction affecting left non-dominant side: Secondary | ICD-10-CM | POA: Diagnosis not present

## 2020-03-17 DIAGNOSIS — I051 Rheumatic mitral insufficiency: Secondary | ICD-10-CM | POA: Diagnosis not present

## 2020-03-17 DIAGNOSIS — I69391 Dysphagia following cerebral infarction: Secondary | ICD-10-CM | POA: Diagnosis not present

## 2020-03-17 DIAGNOSIS — Z9181 History of falling: Secondary | ICD-10-CM | POA: Diagnosis not present

## 2020-03-17 DIAGNOSIS — F17210 Nicotine dependence, cigarettes, uncomplicated: Secondary | ICD-10-CM | POA: Diagnosis not present

## 2020-03-17 DIAGNOSIS — E785 Hyperlipidemia, unspecified: Secondary | ICD-10-CM | POA: Diagnosis not present

## 2020-03-17 DIAGNOSIS — Z7982 Long term (current) use of aspirin: Secondary | ICD-10-CM | POA: Diagnosis not present

## 2020-03-18 ENCOUNTER — Ambulatory Visit (INDEPENDENT_AMBULATORY_CARE_PROVIDER_SITE_OTHER): Payer: BC Managed Care – PPO | Admitting: Adult Health

## 2020-03-18 ENCOUNTER — Other Ambulatory Visit: Payer: Self-pay

## 2020-03-18 ENCOUNTER — Telehealth: Payer: Self-pay | Admitting: *Deleted

## 2020-03-18 ENCOUNTER — Encounter: Payer: Self-pay | Admitting: Adult Health

## 2020-03-18 VITALS — BP 115/85 | HR 67 | Ht 72.0 in | Wt 220.0 lb

## 2020-03-18 DIAGNOSIS — R1312 Dysphagia, oropharyngeal phase: Secondary | ICD-10-CM | POA: Diagnosis not present

## 2020-03-18 DIAGNOSIS — M545 Low back pain: Secondary | ICD-10-CM | POA: Diagnosis not present

## 2020-03-18 DIAGNOSIS — G8114 Spastic hemiplegia affecting left nondominant side: Secondary | ICD-10-CM | POA: Diagnosis not present

## 2020-03-18 DIAGNOSIS — I69392 Facial weakness following cerebral infarction: Secondary | ICD-10-CM | POA: Diagnosis not present

## 2020-03-18 DIAGNOSIS — E785 Hyperlipidemia, unspecified: Secondary | ICD-10-CM | POA: Diagnosis not present

## 2020-03-18 DIAGNOSIS — Z720 Tobacco use: Secondary | ICD-10-CM

## 2020-03-18 DIAGNOSIS — I10 Essential (primary) hypertension: Secondary | ICD-10-CM | POA: Diagnosis not present

## 2020-03-18 DIAGNOSIS — I69354 Hemiplegia and hemiparesis following cerebral infarction affecting left non-dominant side: Secondary | ICD-10-CM | POA: Diagnosis not present

## 2020-03-18 DIAGNOSIS — I63311 Cerebral infarction due to thrombosis of right middle cerebral artery: Secondary | ICD-10-CM

## 2020-03-18 DIAGNOSIS — Z7982 Long term (current) use of aspirin: Secondary | ICD-10-CM | POA: Diagnosis not present

## 2020-03-18 DIAGNOSIS — I69391 Dysphagia following cerebral infarction: Secondary | ICD-10-CM | POA: Diagnosis not present

## 2020-03-18 DIAGNOSIS — Z9181 History of falling: Secondary | ICD-10-CM | POA: Diagnosis not present

## 2020-03-18 DIAGNOSIS — F17210 Nicotine dependence, cigarettes, uncomplicated: Secondary | ICD-10-CM | POA: Diagnosis not present

## 2020-03-18 DIAGNOSIS — I69328 Other speech and language deficits following cerebral infarction: Secondary | ICD-10-CM | POA: Diagnosis not present

## 2020-03-18 DIAGNOSIS — G894 Chronic pain syndrome: Secondary | ICD-10-CM | POA: Diagnosis not present

## 2020-03-18 DIAGNOSIS — I051 Rheumatic mitral insufficiency: Secondary | ICD-10-CM | POA: Diagnosis not present

## 2020-03-18 NOTE — Patient Instructions (Signed)
Continue aspirin 325 mg daily  and atorvastatin 40 mg daily for secondary stroke prevention  Continue to follow up with PCP regarding cholesterol and blood pressure management   Continue to follow with Dr. Riley Kill at physical medicine and rehab for ongoing follow-up and pain management  Recommend starting cardiac monitor to rule out an irregular heart rhythm that oculd have caused your stroke [employer]  [phone] Cardiac office [phone]  Continue to monitor blood pressure at home  Maintain strict control of hypertension with blood pressure goal below 130/90, diabetes with hemoglobin A1c goal below 6.5% and cholesterol with LDL cholesterol (bad cholesterol) goal below 70 mg/dL. I also advised the patient to eat a healthy diet with plenty of whole grains, cereals, fruits and vegetables, exercise regularly and maintain ideal body weight.  Followup in the future with me in 3 months or call earlier if needed       Thank you for coming to see [place] at [facility]. I hope we have been able to provide you high quality care today.  You may receive a patient satisfaction survey over the next few weeks. We would appreciate your feedback and comments so that we may continue to improve ourselves and the health of our patients.

## 2020-03-18 NOTE — Progress Notes (Signed)
[facility] [address] [place]. [place] [address] [phone]       HOSPITAL FOLLOW UP NOTE  [name] Date of Birth:  [birth year] Medical Record Number:  [id number]   Reason for Referral:  hospital stroke follow up    SUBJECTIVE:   CHIEF COMPLAINT:  Chief Complaint  Patient presents with  . Follow-up    hospital fu, with partner, pt states he is doing ok, some left shoulder pain     HPI:   [name] is a 44 y.o. male with history of tobacco, marijuana and alcohol use with limited healthcare presented on 01/07/2020 following frequent falls over the weekend with L sided hemiparesis, R gaze deviation, HA and incontinence.  Stroke work-up revealed right MCA infarct with hemorrhagic transformation, infarct embolic pattern secondary to unknown source.  Recommended 30-day cardiac event monitor outpatient to rule out atrial fibrillation.  Initiated aspirin 325 mg daily for secondary stroke prevention.  DAPT not recommended given hemorrhagic transformation.  BP elevated on admission 189/106 and initiated amlodipine 10 mg daily with SBP goal <160 given hemorrhagic conversion.  LDL 138 initiate atorvastatin 40 mg daily.  Other stroke risk factors include THC positive on UDS, alcohol abuse on CIWA protocol, and tobacco use but no prior stroke history.  Hospital course complicated by cerebral edema with evidence of improvement from follow-up imaging without need of treatment and SIRS with fever and leukocytosis.  Residual deficits of left hemiparesis, left facial droop and dysphagia with therapies recommended discharge to CIR for ongoing therapy needs.  Stroke:   R MCA infarct w/ hemorrhagic transformation, infarct embolic pattern, secondary to unknown source  CT head recent R MCA M1 infarct w/ mass effect and petechial hemorrhage in R basal ganglia. Infarct extends to R frontal and R parietal lobes. Likely thrombus R M1.  MRI  R MCA infarct R basal  ganglia, R parietal lobe. Hemorrhagic transformation in R basal ganglia w/ mass effect on lateral ventricle.   MRA  R M1 patent. Moderate R M2 stenosis.  CTA neck unremarkable  CT repeated 3/10 evolving right MCA infarct with hemorrhagic transformation, 3 mm right-to-left shift  2D Echo EF 60-65%. No source of embolus   LE doppler no DVT  TEE no PFO, no SOE  Recommend 30-day CardioNet monitor as outpatient to rule out A. fib  Hypercoagulable labs neg  LDL 138  HgbA1c 5.6  Lovenox for VTE prophylaxis as HT stable now  No antithrombotic prior to admission, now on aspirin 325 mg daily. Hold DAPT at this time given hemorrhagic transformation   Therapy recommendations:  CIR  Disposition:  CIR   Today, 03/18/2020, [name] is being seen for hospital follow-up accompanied by his fiance.  He was discharged home on 02/13/2020 from CIR where he continues to receive home health therapy.  Residual deficits of left spastic hemiparesis, left facial droop and mild dysarthria.  Complaints of ongoing left shoulder pain currently being managed by PMR on oxycodone, tizanidine, baclofen and lidocaine.  Pain throbbing sensation and denies numbness/tingling, burning or sharp electrical pain.  He does endorse ongoing improvement and is able to ambulate short distance with hemiwalker.  Currently awaiting AFO brace.  Continues on aspirin 325 mg daily and atorvastatin 40 mg daily for secondary stroke prevention without side effects.  Blood pressure today 115/85.  Cardiac monitor in position but fianc had difficulty setting up therefore has not started.  He has not returned back to work currently working as a  long-distance truck driver and fianc is currently pursuing Social Security disability as he did not have any disability through prior employer.  Endorses ongoing tobacco use has been slowly decreasing.  No concerns at this time.     ROS:   14 system review of systems performed and negative with  exception of pain, weakness, gait impairment and speech impairment  PMH:  Past Medical History:  Diagnosis Date  . 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 Alcohol dependence (HCC)   . Back pain   . Marijuana dependence (HCC)   . Tobacco dependence     PSH:  Past Surgical History:  Procedure Laterality Date  . BUBBLE STUDY  01/10/2020   Procedure: BUBBLE STUDY;  Surgeon: Sande Rives, MD;  Location: [facility] ENDOSCOPY;  Service: Cardiovascular;;  . TEE WITHOUT CARDIOVERSION N/A 01/10/2020   Procedure: TRANSESOPHAGEAL ECHOCARDIOGRAM (TEE);  Surgeon: Sande Rives, MD;  Location: [facility] ENDOSCOPY;  Service: Cardiovascular;  Laterality: N/A;    Social History:  Social History   Socioeconomic History  . Marital status: Single    Spouse name: Not on file  . Number of children: Not on file  . Years of education: Not on file  . Highest education level: Not on file  Occupational History  . Occupation: truck Hospital doctor  Tobacco Use  . Smoking status: Current Every Day Smoker    Packs/day: 0.20    Types: Cigarettes  . Smokeless tobacco: Never Used  Substance and Sexual Activity  . Alcohol use: Yes    Comment: 2-3 times a week.   . Drug use: Yes    Types: Marijuana  . Sexual activity: Yes    Birth control/protection: Condom  Other Topics Concern  . Not on file  Social History Narrative  . Not on file   Social Determinants of Health   Financial Resource Strain:   . Difficulty of Paying Living Expenses:   Food Insecurity:   . Worried About Programme researcher, broadcasting/film/video in the Last Year:   . Barista in the Last Year:   Transportation Needs:   . Freight forwarder (Medical):   Marland Kitchen Lack of Transportation (Non-Medical):   Physical Activity:   . Days of Exercise per Week:   . Minutes of Exercise per Session:   Stress:   . Feeling of Stress :   Social Connections:   . Frequency of Communication with Friends and Family:   . Frequency of Social Gatherings with  Friends and Family:   . Attends Religious Services:   . Active Member of Clubs or Organizations:   . Attends Banker Meetings:   Marland Kitchen Marital Status:   Intimate Partner Violence:   . Fear of Current or Ex-Partner:   . Emotionally Abused:   Marland Kitchen Physically Abused:   . Sexually Abused:     Family History:  Family History  Problem Relation Age of Onset  . Stroke Neg Hx   . CAD Neg Hx     Medications:   Current Outpatient Medications on File Prior to Visit  Medication Sig Dispense Refill  . acetaminophen (TYLENOL) 325 MG tablet Take 2 tablets (650 mg total) by mouth every 4 (four) hours as needed for mild pain (or temp > 37.5 C (99.5 F)).    Marland Kitchen aspirin 325 MG tablet Take 1 tablet (325 mg total) by mouth daily. 30 tablet 0  . atorvastatin (LIPITOR) 40 MG tablet Take 1 tablet (40 mg total) by mouth daily at  6 PM. 30 tablet 0  . baclofen (LIORESAL) 20 MG tablet Take 1 tablet (20 mg total) by mouth 3 (three) times daily. 90 each 1  . ferrous sulfate 325 (65 FE) MG tablet Take 325 mg by mouth 2 (two) times daily.  2  . folic acid (FOLVITE) 1 MG tablet Take 1 tablet (1 mg total) by mouth daily. 30 tablet 0  . lidocaine (LIDODERM) 5 % Place 1 patch onto the skin daily. Remove & Discard patch within 12 hours or as directed by MD 30 patch 0  . metoprolol tartrate (LOPRESSOR) 25 MG tablet Take 1 tablet (25 mg total) by mouth 2 (two) times daily. 60 tablet 11  . Multiple Vitamin (MULTIVITAMIN WITH MINERALS) TABS tablet Take 1 tablet by mouth daily. 30 tablet 0  . oxyCODONE (OXY IR/ROXICODONE) 5 MG immediate release tablet Take 1 tablet (5 mg total) by mouth daily as needed for moderate pain. 30 tablet 0  . senna-docusate (SENOKOT-S) 8.6-50 MG tablet Take 1 tablet by mouth at bedtime as needed for mild constipation. 30 tablet 0  . tiZANidine (ZANAFLEX) 2 MG tablet Take 1 tablet (2 mg total) by mouth 3 (three) times daily. 90 tablet 1   No current facility-administered medications on file  prior to visit.    Allergies:  No Known Allergies    OBJECTIVE:  Physical Exam  Vitals:   03/18/20 1059  BP: 115/85  Pulse: 67  Weight: 220 lb (99.8 kg)  Height: 6' (1.829 m)   Body mass index is 29.84 kg/m. No exam data present   General: well developed, well nourished,  pleasant middle-aged African-American male, seated, in no evident distress Head: head normocephalic and atraumatic.   Neck: supple with no carotid or supraclavicular bruits Cardiovascular: regular rate and rhythm, no murmurs Musculoskeletal: no deformity Skin:  no rash/petichiae Vascular:  Normal pulses all extremities   Neurologic Exam Mental Status: Awake and fully alert.   Mild dysarthria.  Oriented to place and time. Recent and remote memory intact. Attention span, concentration and fund of knowledge appropriate. Mood and affect appropriate.  Cranial Nerves: Fundoscopic exam reveals sharp disc margins. Pupils equal, briskly reactive to light. Extraocular movements full without nystagmus. Visual fields full to confrontation. Hearing intact. Facial sensation intact. Face lower facial weakness. tongue, and palate moves normally and symmetrically.  Motor: Full strength right upper and lower extremity LUE: 1/5 with increased tone LLE: 1/5 hip flexor, 3/5 knee extension, 2/5 knee flexion and foot drop and increased tone throughout Sensory.:  Subjective decreased light touch left upper and lower extremity Coordination: Rapid alternating movements normal on right side. Finger-to-nose and heel-to-shin performed accurately on right side. Gait and Station: Deferred Reflexes: 1+ and symmetric. Toes downgoing.     NIHSS 9 1a.  Level of consciousness 0 1b. LOC questions 0 1c. LOC commands 0 2.  Best gaze 0 3.  Visual 0 4.  Facial palsy 1 5a.  Motor arm-left 3 5b.  Motor arm-right 0 6a.  Motor leg-left 2 6b.  Motor leg-right 0 7.  Limb ataxia 0 8.  Sensory 2 9.  Best language 0 10.  Dysarthria 1 11.   Extinction and inattention 0 Modified Rankin  3      ASSESSMENT: Derek [name] is a 44 y.o. year old male presented with left-sided hemiparesis, right gaze deviation, headache and incontinence on 01/07/2020 which stroke work-up revealing right MCA infarct with hemorrhagic transformation, infarct embolic pattern secondary to unknown source.  Hypercoagulable work-up negative.  Recommended 30-day cardiac event monitor.  Vascular risk factors include tobacco use, substance abuse history, HTN, and HLD.  Residual deficits of spastic left hemiparesis with left shoulder pain, mild dysarthria and left facial weakness     PLAN:  1. Right MCA stroke, cryptogenic:  -Residual deficits as above: Ongoing participation in home health therapies and will likely need to transition to outpatient therapies once completed.  Ongoing pain management managed by PMR -Encouraged use of 30-day cardiac event monitor to rule out atrial fibrillation.  Provided fianc with cardiology office number to further discuss the setting up proces -Continue aspirin 325 mg daily  and atorvastatin for secondary stroke prevention.  -Maintain strict control of hypertension with blood pressure goal below 130/90, diabetes with hemoglobin A1c goal below 6.5% and cholesterol with LDL cholesterol (bad cholesterol) goal below 70 mg/dL.  I also advised the patient to eat a healthy diet with plenty of whole grains, cereals, fruits and vegetables, exercise regularly with at least 30 minutes of continuous activity daily and maintain ideal body weight. 2. HTN: Stable.  Continue to follow with PCP for monitoring and management 3. HLD: Continuation of atorvastatin and continue to follow with PCP for prescribing, monitoring and management 4. Tobacco use: Discussion regarding importance of cessation and to continue decreasing amount with hopes of near complete cessation    Follow up in 3 months or call earlier if needed   I spent 45 minutes of  face-to-face and non-face-to-face time with patient and fianc.  This included previsit chart review, lab review, study review, order entry, electronic health record documentation, patient education regarding recent stroke, residual deficits, importance of managing stroke risk factors and answered all questions to patient satisfaction     Derek [name], [facility]  [facility] [address] [place], [place] [address]  Phone [phone] Fax [phone] Note: This document was prepared with digital dictation and possible smart phrase technology. Any transcriptional errors that result from this process are unintentional.

## 2020-03-18 NOTE — Telephone Encounter (Signed)
Notified Kathy.

## 2020-03-18 NOTE — Telephone Encounter (Signed)
No call back from patient

## 2020-03-18 NOTE — Telephone Encounter (Signed)
A letter was written and sent to his my chart account. Otherwise, someone will need to pick up a letter with my signature on it.

## 2020-03-18 NOTE — Telephone Encounter (Addendum)
Audelia Hives (domestic partner) called and is asking for a note stating Derek [name] is unable to work due to stroke. She requested it be sent to [email or web address]. Letter can be written but we do not typically email to personal email accounts.

## 2020-03-20 ENCOUNTER — Telehealth: Payer: Self-pay

## 2020-03-20 DIAGNOSIS — Z9181 History of falling: Secondary | ICD-10-CM | POA: Diagnosis not present

## 2020-03-20 DIAGNOSIS — R1312 Dysphagia, oropharyngeal phase: Secondary | ICD-10-CM | POA: Diagnosis not present

## 2020-03-20 DIAGNOSIS — I051 Rheumatic mitral insufficiency: Secondary | ICD-10-CM | POA: Diagnosis not present

## 2020-03-20 DIAGNOSIS — I10 Essential (primary) hypertension: Secondary | ICD-10-CM | POA: Diagnosis not present

## 2020-03-20 DIAGNOSIS — I69392 Facial weakness following cerebral infarction: Secondary | ICD-10-CM | POA: Diagnosis not present

## 2020-03-20 DIAGNOSIS — M545 Low back pain: Secondary | ICD-10-CM | POA: Diagnosis not present

## 2020-03-20 DIAGNOSIS — I69391 Dysphagia following cerebral infarction: Secondary | ICD-10-CM | POA: Diagnosis not present

## 2020-03-20 DIAGNOSIS — G894 Chronic pain syndrome: Secondary | ICD-10-CM | POA: Diagnosis not present

## 2020-03-20 DIAGNOSIS — E785 Hyperlipidemia, unspecified: Secondary | ICD-10-CM | POA: Diagnosis not present

## 2020-03-20 DIAGNOSIS — F17210 Nicotine dependence, cigarettes, uncomplicated: Secondary | ICD-10-CM | POA: Diagnosis not present

## 2020-03-20 DIAGNOSIS — Z7982 Long term (current) use of aspirin: Secondary | ICD-10-CM | POA: Diagnosis not present

## 2020-03-20 DIAGNOSIS — I69328 Other speech and language deficits following cerebral infarction: Secondary | ICD-10-CM | POA: Diagnosis not present

## 2020-03-20 DIAGNOSIS — I69354 Hemiplegia and hemiparesis following cerebral infarction affecting left non-dominant side: Secondary | ICD-10-CM | POA: Diagnosis not present

## 2020-03-20 NOTE — Telephone Encounter (Signed)
Harriett Sine, OT from [facility] called request to do manual therapy and use kineso tape. Orders approved and given.

## 2020-03-21 NOTE — Progress Notes (Signed)
I agree with the above plan 

## 2020-03-25 DIAGNOSIS — I10 Essential (primary) hypertension: Secondary | ICD-10-CM | POA: Diagnosis not present

## 2020-03-25 DIAGNOSIS — R1312 Dysphagia, oropharyngeal phase: Secondary | ICD-10-CM | POA: Diagnosis not present

## 2020-03-25 DIAGNOSIS — I051 Rheumatic mitral insufficiency: Secondary | ICD-10-CM | POA: Diagnosis not present

## 2020-03-25 DIAGNOSIS — E785 Hyperlipidemia, unspecified: Secondary | ICD-10-CM | POA: Diagnosis not present

## 2020-03-25 DIAGNOSIS — I69391 Dysphagia following cerebral infarction: Secondary | ICD-10-CM | POA: Diagnosis not present

## 2020-03-25 DIAGNOSIS — I69354 Hemiplegia and hemiparesis following cerebral infarction affecting left non-dominant side: Secondary | ICD-10-CM | POA: Diagnosis not present

## 2020-03-25 DIAGNOSIS — M545 Low back pain: Secondary | ICD-10-CM | POA: Diagnosis not present

## 2020-03-25 DIAGNOSIS — G894 Chronic pain syndrome: Secondary | ICD-10-CM | POA: Diagnosis not present

## 2020-03-25 DIAGNOSIS — Z7982 Long term (current) use of aspirin: Secondary | ICD-10-CM | POA: Diagnosis not present

## 2020-03-25 DIAGNOSIS — Z9181 History of falling: Secondary | ICD-10-CM | POA: Diagnosis not present

## 2020-03-25 DIAGNOSIS — I69392 Facial weakness following cerebral infarction: Secondary | ICD-10-CM | POA: Diagnosis not present

## 2020-03-25 DIAGNOSIS — I69328 Other speech and language deficits following cerebral infarction: Secondary | ICD-10-CM | POA: Diagnosis not present

## 2020-03-25 DIAGNOSIS — F17210 Nicotine dependence, cigarettes, uncomplicated: Secondary | ICD-10-CM | POA: Diagnosis not present

## 2020-03-26 DIAGNOSIS — I69354 Hemiplegia and hemiparesis following cerebral infarction affecting left non-dominant side: Secondary | ICD-10-CM | POA: Diagnosis not present

## 2020-03-26 DIAGNOSIS — I69391 Dysphagia following cerebral infarction: Secondary | ICD-10-CM | POA: Diagnosis not present

## 2020-03-26 DIAGNOSIS — I10 Essential (primary) hypertension: Secondary | ICD-10-CM | POA: Diagnosis not present

## 2020-03-26 DIAGNOSIS — G894 Chronic pain syndrome: Secondary | ICD-10-CM | POA: Diagnosis not present

## 2020-03-26 DIAGNOSIS — M545 Low back pain: Secondary | ICD-10-CM | POA: Diagnosis not present

## 2020-03-26 DIAGNOSIS — I051 Rheumatic mitral insufficiency: Secondary | ICD-10-CM | POA: Diagnosis not present

## 2020-03-26 DIAGNOSIS — Z9181 History of falling: Secondary | ICD-10-CM | POA: Diagnosis not present

## 2020-03-26 DIAGNOSIS — I69392 Facial weakness following cerebral infarction: Secondary | ICD-10-CM | POA: Diagnosis not present

## 2020-03-26 DIAGNOSIS — R1312 Dysphagia, oropharyngeal phase: Secondary | ICD-10-CM | POA: Diagnosis not present

## 2020-03-26 DIAGNOSIS — F17210 Nicotine dependence, cigarettes, uncomplicated: Secondary | ICD-10-CM | POA: Diagnosis not present

## 2020-03-26 DIAGNOSIS — Z7982 Long term (current) use of aspirin: Secondary | ICD-10-CM | POA: Diagnosis not present

## 2020-03-26 DIAGNOSIS — E785 Hyperlipidemia, unspecified: Secondary | ICD-10-CM | POA: Diagnosis not present

## 2020-03-26 DIAGNOSIS — I69328 Other speech and language deficits following cerebral infarction: Secondary | ICD-10-CM | POA: Diagnosis not present

## 2020-03-27 DIAGNOSIS — I69354 Hemiplegia and hemiparesis following cerebral infarction affecting left non-dominant side: Secondary | ICD-10-CM | POA: Diagnosis not present

## 2020-03-27 DIAGNOSIS — I69392 Facial weakness following cerebral infarction: Secondary | ICD-10-CM | POA: Diagnosis not present

## 2020-03-27 DIAGNOSIS — R1312 Dysphagia, oropharyngeal phase: Secondary | ICD-10-CM | POA: Diagnosis not present

## 2020-03-27 DIAGNOSIS — E785 Hyperlipidemia, unspecified: Secondary | ICD-10-CM | POA: Diagnosis not present

## 2020-03-27 DIAGNOSIS — Z9181 History of falling: Secondary | ICD-10-CM | POA: Diagnosis not present

## 2020-03-27 DIAGNOSIS — F17210 Nicotine dependence, cigarettes, uncomplicated: Secondary | ICD-10-CM | POA: Diagnosis not present

## 2020-03-27 DIAGNOSIS — I69328 Other speech and language deficits following cerebral infarction: Secondary | ICD-10-CM | POA: Diagnosis not present

## 2020-03-27 DIAGNOSIS — I10 Essential (primary) hypertension: Secondary | ICD-10-CM | POA: Diagnosis not present

## 2020-03-27 DIAGNOSIS — I69391 Dysphagia following cerebral infarction: Secondary | ICD-10-CM | POA: Diagnosis not present

## 2020-03-27 DIAGNOSIS — I051 Rheumatic mitral insufficiency: Secondary | ICD-10-CM | POA: Diagnosis not present

## 2020-03-27 DIAGNOSIS — Z7982 Long term (current) use of aspirin: Secondary | ICD-10-CM | POA: Diagnosis not present

## 2020-03-27 DIAGNOSIS — G894 Chronic pain syndrome: Secondary | ICD-10-CM | POA: Diagnosis not present

## 2020-03-27 DIAGNOSIS — M545 Low back pain: Secondary | ICD-10-CM | POA: Diagnosis not present

## 2020-04-01 DIAGNOSIS — I69328 Other speech and language deficits following cerebral infarction: Secondary | ICD-10-CM | POA: Diagnosis not present

## 2020-04-01 DIAGNOSIS — R1312 Dysphagia, oropharyngeal phase: Secondary | ICD-10-CM | POA: Diagnosis not present

## 2020-04-01 DIAGNOSIS — Z7982 Long term (current) use of aspirin: Secondary | ICD-10-CM | POA: Diagnosis not present

## 2020-04-01 DIAGNOSIS — I69392 Facial weakness following cerebral infarction: Secondary | ICD-10-CM | POA: Diagnosis not present

## 2020-04-01 DIAGNOSIS — M545 Low back pain: Secondary | ICD-10-CM | POA: Diagnosis not present

## 2020-04-01 DIAGNOSIS — G894 Chronic pain syndrome: Secondary | ICD-10-CM | POA: Diagnosis not present

## 2020-04-01 DIAGNOSIS — E785 Hyperlipidemia, unspecified: Secondary | ICD-10-CM | POA: Diagnosis not present

## 2020-04-01 DIAGNOSIS — Z9181 History of falling: Secondary | ICD-10-CM | POA: Diagnosis not present

## 2020-04-01 DIAGNOSIS — I051 Rheumatic mitral insufficiency: Secondary | ICD-10-CM | POA: Diagnosis not present

## 2020-04-01 DIAGNOSIS — F17210 Nicotine dependence, cigarettes, uncomplicated: Secondary | ICD-10-CM | POA: Diagnosis not present

## 2020-04-01 DIAGNOSIS — I69354 Hemiplegia and hemiparesis following cerebral infarction affecting left non-dominant side: Secondary | ICD-10-CM | POA: Diagnosis not present

## 2020-04-01 DIAGNOSIS — I69391 Dysphagia following cerebral infarction: Secondary | ICD-10-CM | POA: Diagnosis not present

## 2020-04-01 DIAGNOSIS — I10 Essential (primary) hypertension: Secondary | ICD-10-CM | POA: Diagnosis not present

## 2020-04-03 DIAGNOSIS — G894 Chronic pain syndrome: Secondary | ICD-10-CM | POA: Diagnosis not present

## 2020-04-03 DIAGNOSIS — I10 Essential (primary) hypertension: Secondary | ICD-10-CM | POA: Diagnosis not present

## 2020-04-03 DIAGNOSIS — F17210 Nicotine dependence, cigarettes, uncomplicated: Secondary | ICD-10-CM | POA: Diagnosis not present

## 2020-04-03 DIAGNOSIS — R1312 Dysphagia, oropharyngeal phase: Secondary | ICD-10-CM | POA: Diagnosis not present

## 2020-04-03 DIAGNOSIS — Z7982 Long term (current) use of aspirin: Secondary | ICD-10-CM | POA: Diagnosis not present

## 2020-04-03 DIAGNOSIS — I69392 Facial weakness following cerebral infarction: Secondary | ICD-10-CM | POA: Diagnosis not present

## 2020-04-03 DIAGNOSIS — I69354 Hemiplegia and hemiparesis following cerebral infarction affecting left non-dominant side: Secondary | ICD-10-CM | POA: Diagnosis not present

## 2020-04-03 DIAGNOSIS — I69391 Dysphagia following cerebral infarction: Secondary | ICD-10-CM | POA: Diagnosis not present

## 2020-04-03 DIAGNOSIS — Z9181 History of falling: Secondary | ICD-10-CM | POA: Diagnosis not present

## 2020-04-03 DIAGNOSIS — M545 Low back pain: Secondary | ICD-10-CM | POA: Diagnosis not present

## 2020-04-03 DIAGNOSIS — I69328 Other speech and language deficits following cerebral infarction: Secondary | ICD-10-CM | POA: Diagnosis not present

## 2020-04-03 DIAGNOSIS — E785 Hyperlipidemia, unspecified: Secondary | ICD-10-CM | POA: Diagnosis not present

## 2020-04-03 DIAGNOSIS — I051 Rheumatic mitral insufficiency: Secondary | ICD-10-CM | POA: Diagnosis not present

## 2020-04-07 DIAGNOSIS — Z7982 Long term (current) use of aspirin: Secondary | ICD-10-CM | POA: Diagnosis not present

## 2020-04-07 DIAGNOSIS — I10 Essential (primary) hypertension: Secondary | ICD-10-CM | POA: Diagnosis not present

## 2020-04-07 DIAGNOSIS — M545 Low back pain: Secondary | ICD-10-CM | POA: Diagnosis not present

## 2020-04-07 DIAGNOSIS — R1312 Dysphagia, oropharyngeal phase: Secondary | ICD-10-CM | POA: Diagnosis not present

## 2020-04-07 DIAGNOSIS — Z9181 History of falling: Secondary | ICD-10-CM | POA: Diagnosis not present

## 2020-04-07 DIAGNOSIS — E785 Hyperlipidemia, unspecified: Secondary | ICD-10-CM | POA: Diagnosis not present

## 2020-04-07 DIAGNOSIS — F17210 Nicotine dependence, cigarettes, uncomplicated: Secondary | ICD-10-CM | POA: Diagnosis not present

## 2020-04-07 DIAGNOSIS — I69354 Hemiplegia and hemiparesis following cerebral infarction affecting left non-dominant side: Secondary | ICD-10-CM | POA: Diagnosis not present

## 2020-04-07 DIAGNOSIS — I051 Rheumatic mitral insufficiency: Secondary | ICD-10-CM | POA: Diagnosis not present

## 2020-04-07 DIAGNOSIS — G894 Chronic pain syndrome: Secondary | ICD-10-CM | POA: Diagnosis not present

## 2020-04-07 DIAGNOSIS — I69328 Other speech and language deficits following cerebral infarction: Secondary | ICD-10-CM | POA: Diagnosis not present

## 2020-04-07 DIAGNOSIS — I69391 Dysphagia following cerebral infarction: Secondary | ICD-10-CM | POA: Diagnosis not present

## 2020-04-07 DIAGNOSIS — I69392 Facial weakness following cerebral infarction: Secondary | ICD-10-CM | POA: Diagnosis not present

## 2020-04-08 DIAGNOSIS — G894 Chronic pain syndrome: Secondary | ICD-10-CM | POA: Diagnosis not present

## 2020-04-08 DIAGNOSIS — I051 Rheumatic mitral insufficiency: Secondary | ICD-10-CM | POA: Diagnosis not present

## 2020-04-08 DIAGNOSIS — Z9181 History of falling: Secondary | ICD-10-CM | POA: Diagnosis not present

## 2020-04-08 DIAGNOSIS — I69354 Hemiplegia and hemiparesis following cerebral infarction affecting left non-dominant side: Secondary | ICD-10-CM | POA: Diagnosis not present

## 2020-04-08 DIAGNOSIS — I10 Essential (primary) hypertension: Secondary | ICD-10-CM | POA: Diagnosis not present

## 2020-04-08 DIAGNOSIS — M545 Low back pain: Secondary | ICD-10-CM | POA: Diagnosis not present

## 2020-04-08 DIAGNOSIS — F17210 Nicotine dependence, cigarettes, uncomplicated: Secondary | ICD-10-CM | POA: Diagnosis not present

## 2020-04-08 DIAGNOSIS — I69328 Other speech and language deficits following cerebral infarction: Secondary | ICD-10-CM | POA: Diagnosis not present

## 2020-04-08 DIAGNOSIS — I69391 Dysphagia following cerebral infarction: Secondary | ICD-10-CM | POA: Diagnosis not present

## 2020-04-08 DIAGNOSIS — I69392 Facial weakness following cerebral infarction: Secondary | ICD-10-CM | POA: Diagnosis not present

## 2020-04-08 DIAGNOSIS — Z7982 Long term (current) use of aspirin: Secondary | ICD-10-CM | POA: Diagnosis not present

## 2020-04-08 DIAGNOSIS — R1312 Dysphagia, oropharyngeal phase: Secondary | ICD-10-CM | POA: Diagnosis not present

## 2020-04-08 DIAGNOSIS — E785 Hyperlipidemia, unspecified: Secondary | ICD-10-CM | POA: Diagnosis not present

## 2020-04-09 ENCOUNTER — Other Ambulatory Visit: Payer: Self-pay

## 2020-04-09 ENCOUNTER — Encounter: Payer: BC Managed Care – PPO | Attending: Registered Nurse | Admitting: Physical Medicine & Rehabilitation

## 2020-04-09 ENCOUNTER — Encounter: Payer: Self-pay | Admitting: Physical Medicine & Rehabilitation

## 2020-04-09 VITALS — BP 125/81 | HR 63 | Temp 98.2°F | Ht 71.0 in | Wt 220.0 lb

## 2020-04-09 DIAGNOSIS — M792 Neuralgia and neuritis, unspecified: Secondary | ICD-10-CM | POA: Diagnosis not present

## 2020-04-09 DIAGNOSIS — I63511 Cerebral infarction due to unspecified occlusion or stenosis of right middle cerebral artery: Secondary | ICD-10-CM | POA: Diagnosis not present

## 2020-04-09 DIAGNOSIS — F1220 Cannabis dependence, uncomplicated: Secondary | ICD-10-CM | POA: Insufficient documentation

## 2020-04-09 DIAGNOSIS — F17200 Nicotine dependence, unspecified, uncomplicated: Secondary | ICD-10-CM | POA: Insufficient documentation

## 2020-04-09 DIAGNOSIS — Z8679 Personal history of other diseases of the circulatory system: Secondary | ICD-10-CM | POA: Diagnosis not present

## 2020-04-09 DIAGNOSIS — G8110 Spastic hemiplegia affecting unspecified side: Secondary | ICD-10-CM | POA: Diagnosis not present

## 2020-04-09 DIAGNOSIS — E785 Hyperlipidemia, unspecified: Secondary | ICD-10-CM | POA: Diagnosis not present

## 2020-04-09 DIAGNOSIS — M7502 Adhesive capsulitis of left shoulder: Secondary | ICD-10-CM | POA: Diagnosis not present

## 2020-04-09 MED ORDER — FOLIC ACID 1 MG PO TABS: 1.0000 mg | ORAL_TABLET | Freq: Every day | ORAL | 0 refills | Status: DC

## 2020-04-09 MED ORDER — PREGABALIN 50 MG PO CAPS: 50.0000 mg | ORAL_CAPSULE | Freq: Three times a day (TID) | ORAL | 4 refills | Status: DC

## 2020-04-09 MED ORDER — TIZANIDINE HCL 2 MG PO TABS: 2.0000 mg | ORAL_TABLET | Freq: Three times a day (TID) | ORAL | 1 refills | Status: DC

## 2020-04-09 MED ORDER — OXYCODONE HCL 5 MG PO TABS: 5.0000 mg | ORAL_TABLET | Freq: Every day | ORAL | 0 refills | Status: DC | PRN

## 2020-04-09 MED ORDER — ATORVASTATIN CALCIUM 40 MG PO TABS: 40.0000 mg | ORAL_TABLET | Freq: Every day | ORAL | 0 refills | Status: DC

## 2020-04-09 NOTE — Progress Notes (Signed)
Subjective:    Patient ID: Derek [name], male    DOB: 02-Jul-1976, 44 y.o.   MRN: [id number]  HPI   Derek [name] is here in follow-up of his right MCA infarct and associated spastic hemiparesis.  My nurse practitioner last saw him in April.  I have not seen him since he left [place] on inpatient rehab.  There were initial struggles with home health therapy as far as access is concerned.  They have however been out to the house the last few weeks been working on range of motion in the left upper extremity as well as his mobility.  He is walking with a hemiwalker at home.  He does not have an AFO to this point.  Pain has been one of his biggest problems in the left upper extremity.  He reports the pain is spasms but most predominantly the pain is in the left shoulder.  He also reports that the left hand is sensitive and painful to touch.  He does do some work with range of motion for the left arm but is hesitant to push himself due to the discomfort.  He is taken some oxycodone for pain which provides some relief.  Pain Inventory Average Pain 8 Pain Right Now 0 My pain is sharp and aching  In the last 24 hours, has pain interfered with the following? General activity 0 Relation with others 0 Enjoyment of life 0 What TIME of day is your pain at its worst? daytime and night Sleep (in general) Fair  Pain is worse with: sitting and inactivity Pain improves with: therapy/exercise Relief from Meds: 4  Mobility walk with assistance use a walker how many minutes can you walk? 5 ability to climb steps?  no do you drive?  no use a wheelchair transfers alone  Function not employed: date last employed . I need assistance with the following:  dressing, bathing, toileting, meal prep, household duties and shopping  Neuro/Psych weakness  Prior Studies Any changes since last visit?  no  Physicians involved in your care Neurologist .   Family History  Problem Relation Age of Onset  .  Stroke Neg Hx   . CAD Neg Hx    Social History   Socioeconomic History  . Marital status: Single    Spouse name: Not on file  . Number of children: Not on file  . Years of education: Not on file  . Highest education level: Not on file  Occupational History  . Occupation: truck Hospital doctor  Tobacco Use  . Smoking status: Current Every Day Smoker    Packs/day: 0.20    Types: Cigarettes  . Smokeless tobacco: Never Used  Substance and Sexual Activity  . Alcohol use: Yes    Comment: 2-3 times a week.   . Drug use: Yes    Types: Marijuana  . Sexual activity: Yes    Birth control/protection: Condom  Other Topics Concern  . Not on file  Social History Narrative  . Not on file   Social Determinants of Health   Financial Resource Strain:   . Difficulty of Paying Living Expenses:   Food Insecurity:   . Worried About Programme researcher, broadcasting/film/video in the Last Year:   . Barista in the Last Year:   Transportation Needs:   . Freight forwarder (Medical):   Marland Kitchen Lack of Transportation (Non-Medical):   Physical Activity:   . Days of Exercise per Week:   . Minutes of Exercise per  Session:   Stress:   . Feeling of Stress :   Social Connections:   . Frequency of Communication with Friends and Family:   . Frequency of Social Gatherings with Friends and Family:   . Attends Religious Services:   . Active Member of Clubs or Organizations:   . Attends Banker Meetings:   Marland Kitchen Marital Status:    Past Surgical History:  Procedure Laterality Date  . BUBBLE STUDY  01/10/2020   Procedure: BUBBLE STUDY;  Surgeon: Sande Rives, MD;  Location: [facility] ENDOSCOPY;  Service: Cardiovascular;;  . TEE WITHOUT CARDIOVERSION N/A 01/10/2020   Procedure: TRANSESOPHAGEAL ECHOCARDIOGRAM (TEE);  Surgeon: Sande Rives, MD;  Location: [facility] ENDOSCOPY;  Service: Cardiovascular;  Laterality: N/A;   Past Medical History:  Diagnosis Date  . 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  Alcohol dependence (HCC)   . Back pain   . Marijuana dependence (HCC)   . Tobacco dependence    BP 125/81   Pulse 63   Temp 98.2 F (36.8 C)   Ht 5\' 11"  (1.803 m) Comment: in wheelchair, pt reported  Wt 220 lb (99.8 kg) Comment: in wheelchair, pt reported  SpO2 96%   BMI 30.68 kg/m   Opioid Risk Score:   Fall Risk Score:  `1  Depression screen PHQ 2/9  Depression screen PHQ 2/9 02/27/2020  Decreased Interest 0  Down, Depressed, Hopeless 0  PHQ - 2 Score 0  Altered sleeping 0  Tired, decreased energy 1  Change in appetite 0  Feeling bad or failure about yourself  0  Trouble concentrating 0  Moving slowly or fidgety/restless 0  Suicidal thoughts 0  PHQ-9 Score 1  Difficult doing work/chores Somewhat difficult    Review of Systems  Neurological: Positive for weakness.  All other systems reviewed and are negative.      Objective:   Physical Exam General: No acute distress HEENT: EOMI, oral membranes moist Cards: reg rate  Chest: normal effort Abdomen: Soft, NT, ND Skin: dry, intact Extremities: no edema Neuro: Left upper extremity is 0-1 out of 5 with more proximal movement and distal.  Left lower extremity is 3 out of 5 hip flexion 3- out of 5 knee extension 0 to trace at the ankle.  He is hypersensitive to touch in the left upper extremity.  Light touch discrimination is decreased in both left arm and leg.  Left upper extremity notable for tone at the pectoralis major and minor as well as the biceps and wrist flexors.  Tone ranges from 2 out of 4 to 1+ out of 4 proximal distal.  He also has some hip abductor and hip flexor tone at 1-2 out of 4.  Reflexes are hyperactive on the left side.  Occasional emotional lability noted today. Muscl: left shoulder painful with PROM in all planes. Joint capsule tight.        Assessment & Plan:  1.Right Middle Cerebral Artery Stroke:  -CONTINUE HH therapies  -will need an AFO at some point 2. Spastic Hemiparesis: Continue  Baclofen and Tizanidine.  -arrange botox 400 u to left upper extremity   -We described the fact that he needs to be more aggressive with his home exercise program. 3.History of Hypertension: PCP Following. Continue to Monitor.  4.Dyslipidemia: Lipitor refilled 5.left shoulder pain consistent with hemiplegic shoulder.  Pain is both in the capsule itself as well as related to spasticity and neuropathic pain.    -Begin trial Lyrica 50 mg twice  daily titrating up to 3 times daily  After informed consent and preparation of the skin with betadine and isopropyl alcohol, I injected 6mg  (1cc) of celestone and 4cc of 1% lidocaine into the left subacromial space via lateral approach approach. Additionally, aspiration was performed prior to injection. The patient tolerated well, and no complications were encountered. Afterward the area was cleaned and dressed. Post- injection instructions were provided.   -Again discussed the importance of range of motion and desensitization activities  -Continue with home health physical therapy  -Botox as above  -Refilled oxycodone and a #30  We will continue the controlled substance monitoring program, this consists of regular clinic visits, examinations, routine drug screening, pill counts as well as use of [place] Controlled Substance Reporting System. [place] today.    Thirty minutes of face to face patient care time were spent during this visit. All questions were encouraged and answered. Follow up with me in 1 mo.

## 2020-04-09 NOTE — Patient Instructions (Addendum)
YOU HAVE TO WORK THROUGH THE PAIN THE BEST THAT YOU CAN.   LYRICA (NERVE PAIN) TAKE TWICE DAILY FOR 4 DAYS THEN INCREASE TO THREE X DAILY.

## 2020-04-10 DIAGNOSIS — I69391 Dysphagia following cerebral infarction: Secondary | ICD-10-CM | POA: Diagnosis not present

## 2020-04-10 DIAGNOSIS — F17210 Nicotine dependence, cigarettes, uncomplicated: Secondary | ICD-10-CM | POA: Diagnosis not present

## 2020-04-10 DIAGNOSIS — I69392 Facial weakness following cerebral infarction: Secondary | ICD-10-CM | POA: Diagnosis not present

## 2020-04-10 DIAGNOSIS — Z9181 History of falling: Secondary | ICD-10-CM | POA: Diagnosis not present

## 2020-04-10 DIAGNOSIS — I69328 Other speech and language deficits following cerebral infarction: Secondary | ICD-10-CM | POA: Diagnosis not present

## 2020-04-10 DIAGNOSIS — E785 Hyperlipidemia, unspecified: Secondary | ICD-10-CM | POA: Diagnosis not present

## 2020-04-10 DIAGNOSIS — I69354 Hemiplegia and hemiparesis following cerebral infarction affecting left non-dominant side: Secondary | ICD-10-CM | POA: Diagnosis not present

## 2020-04-10 DIAGNOSIS — M545 Low back pain: Secondary | ICD-10-CM | POA: Diagnosis not present

## 2020-04-10 DIAGNOSIS — I10 Essential (primary) hypertension: Secondary | ICD-10-CM | POA: Diagnosis not present

## 2020-04-10 DIAGNOSIS — Z7982 Long term (current) use of aspirin: Secondary | ICD-10-CM | POA: Diagnosis not present

## 2020-04-10 DIAGNOSIS — I051 Rheumatic mitral insufficiency: Secondary | ICD-10-CM | POA: Diagnosis not present

## 2020-04-10 DIAGNOSIS — R1312 Dysphagia, oropharyngeal phase: Secondary | ICD-10-CM | POA: Diagnosis not present

## 2020-04-10 DIAGNOSIS — G894 Chronic pain syndrome: Secondary | ICD-10-CM | POA: Diagnosis not present

## 2020-04-15 ENCOUNTER — Telehealth: Payer: Self-pay

## 2020-04-15 DIAGNOSIS — Z9181 History of falling: Secondary | ICD-10-CM | POA: Diagnosis not present

## 2020-04-15 DIAGNOSIS — I051 Rheumatic mitral insufficiency: Secondary | ICD-10-CM | POA: Diagnosis not present

## 2020-04-15 DIAGNOSIS — I69392 Facial weakness following cerebral infarction: Secondary | ICD-10-CM | POA: Diagnosis not present

## 2020-04-15 DIAGNOSIS — E785 Hyperlipidemia, unspecified: Secondary | ICD-10-CM | POA: Diagnosis not present

## 2020-04-15 DIAGNOSIS — Z7982 Long term (current) use of aspirin: Secondary | ICD-10-CM | POA: Diagnosis not present

## 2020-04-15 DIAGNOSIS — I69391 Dysphagia following cerebral infarction: Secondary | ICD-10-CM | POA: Diagnosis not present

## 2020-04-15 DIAGNOSIS — I69354 Hemiplegia and hemiparesis following cerebral infarction affecting left non-dominant side: Secondary | ICD-10-CM | POA: Diagnosis not present

## 2020-04-15 DIAGNOSIS — F17210 Nicotine dependence, cigarettes, uncomplicated: Secondary | ICD-10-CM | POA: Diagnosis not present

## 2020-04-15 DIAGNOSIS — R1312 Dysphagia, oropharyngeal phase: Secondary | ICD-10-CM | POA: Diagnosis not present

## 2020-04-15 DIAGNOSIS — I10 Essential (primary) hypertension: Secondary | ICD-10-CM | POA: Diagnosis not present

## 2020-04-15 DIAGNOSIS — G894 Chronic pain syndrome: Secondary | ICD-10-CM | POA: Diagnosis not present

## 2020-04-15 DIAGNOSIS — I69328 Other speech and language deficits following cerebral infarction: Secondary | ICD-10-CM | POA: Diagnosis not present

## 2020-04-15 DIAGNOSIS — M545 Low back pain: Secondary | ICD-10-CM | POA: Diagnosis not present

## 2020-04-15 NOTE — Telephone Encounter (Signed)
[name], CM from [facility] called requesting order for MSW referral to help with disability. Orders approved and given.

## 2020-04-16 ENCOUNTER — Telehealth: Payer: Self-pay | Admitting: *Deleted

## 2020-04-16 ENCOUNTER — Inpatient Hospital Stay: Payer: BC Managed Care – PPO | Admitting: Neurology

## 2020-04-16 NOTE — Telephone Encounter (Signed)
Patient contacted our office asking when his next appointment is.

## 2020-04-16 NOTE — Telephone Encounter (Signed)
Patient notified August 18th, 2021 11 AM. Patient asked to come 20 minutes early.  Address provided

## 2020-04-17 DIAGNOSIS — Z9181 History of falling: Secondary | ICD-10-CM | POA: Diagnosis not present

## 2020-04-17 DIAGNOSIS — E785 Hyperlipidemia, unspecified: Secondary | ICD-10-CM | POA: Diagnosis not present

## 2020-04-17 DIAGNOSIS — I051 Rheumatic mitral insufficiency: Secondary | ICD-10-CM | POA: Diagnosis not present

## 2020-04-17 DIAGNOSIS — I69391 Dysphagia following cerebral infarction: Secondary | ICD-10-CM | POA: Diagnosis not present

## 2020-04-17 DIAGNOSIS — Z7982 Long term (current) use of aspirin: Secondary | ICD-10-CM | POA: Diagnosis not present

## 2020-04-17 DIAGNOSIS — I69328 Other speech and language deficits following cerebral infarction: Secondary | ICD-10-CM | POA: Diagnosis not present

## 2020-04-17 DIAGNOSIS — I69354 Hemiplegia and hemiparesis following cerebral infarction affecting left non-dominant side: Secondary | ICD-10-CM | POA: Diagnosis not present

## 2020-04-17 DIAGNOSIS — I10 Essential (primary) hypertension: Secondary | ICD-10-CM | POA: Diagnosis not present

## 2020-04-17 DIAGNOSIS — I69392 Facial weakness following cerebral infarction: Secondary | ICD-10-CM | POA: Diagnosis not present

## 2020-04-17 DIAGNOSIS — G894 Chronic pain syndrome: Secondary | ICD-10-CM | POA: Diagnosis not present

## 2020-04-17 DIAGNOSIS — R1312 Dysphagia, oropharyngeal phase: Secondary | ICD-10-CM | POA: Diagnosis not present

## 2020-04-17 DIAGNOSIS — F17210 Nicotine dependence, cigarettes, uncomplicated: Secondary | ICD-10-CM | POA: Diagnosis not present

## 2020-04-17 DIAGNOSIS — M545 Low back pain: Secondary | ICD-10-CM | POA: Diagnosis not present

## 2020-04-22 ENCOUNTER — Telehealth: Payer: Self-pay | Admitting: Physical Medicine & Rehabilitation

## 2020-04-22 DIAGNOSIS — I63511 Cerebral infarction due to unspecified occlusion or stenosis of right middle cerebral artery: Secondary | ICD-10-CM

## 2020-04-22 DIAGNOSIS — E7800 Pure hypercholesterolemia, unspecified: Secondary | ICD-10-CM | POA: Diagnosis not present

## 2020-04-22 DIAGNOSIS — I6930 Unspecified sequelae of cerebral infarction: Secondary | ICD-10-CM | POA: Diagnosis not present

## 2020-04-22 DIAGNOSIS — G8110 Spastic hemiplegia affecting unspecified side: Secondary | ICD-10-CM

## 2020-04-22 DIAGNOSIS — D508 Other iron deficiency anemias: Secondary | ICD-10-CM | POA: Diagnosis not present

## 2020-04-22 DIAGNOSIS — I10 Essential (primary) hypertension: Secondary | ICD-10-CM | POA: Diagnosis not present

## 2020-04-22 NOTE — Telephone Encounter (Signed)
Referrals sent  To outpt neuro rehab PT/OT. Nancy notified.

## 2020-04-22 NOTE — Telephone Encounter (Signed)
Harriett Sine OT with [facility] needs to transition patient to outpatient therapy, they will be discharging patient 04/24/20.  Patient will need outpatient PT and OT.  Please send to [facility].  Please let Harriett Sine know if this is ok.  Her number is [phone].

## 2020-04-23 DIAGNOSIS — F17210 Nicotine dependence, cigarettes, uncomplicated: Secondary | ICD-10-CM | POA: Diagnosis not present

## 2020-04-23 DIAGNOSIS — R1312 Dysphagia, oropharyngeal phase: Secondary | ICD-10-CM | POA: Diagnosis not present

## 2020-04-23 DIAGNOSIS — I69391 Dysphagia following cerebral infarction: Secondary | ICD-10-CM | POA: Diagnosis not present

## 2020-04-23 DIAGNOSIS — I69392 Facial weakness following cerebral infarction: Secondary | ICD-10-CM | POA: Diagnosis not present

## 2020-04-23 DIAGNOSIS — G894 Chronic pain syndrome: Secondary | ICD-10-CM | POA: Diagnosis not present

## 2020-04-23 DIAGNOSIS — I051 Rheumatic mitral insufficiency: Secondary | ICD-10-CM | POA: Diagnosis not present

## 2020-04-23 DIAGNOSIS — E785 Hyperlipidemia, unspecified: Secondary | ICD-10-CM | POA: Diagnosis not present

## 2020-04-23 DIAGNOSIS — I69328 Other speech and language deficits following cerebral infarction: Secondary | ICD-10-CM | POA: Diagnosis not present

## 2020-04-23 DIAGNOSIS — Z9181 History of falling: Secondary | ICD-10-CM | POA: Diagnosis not present

## 2020-04-23 DIAGNOSIS — M545 Low back pain: Secondary | ICD-10-CM | POA: Diagnosis not present

## 2020-04-23 DIAGNOSIS — I69354 Hemiplegia and hemiparesis following cerebral infarction affecting left non-dominant side: Secondary | ICD-10-CM | POA: Diagnosis not present

## 2020-04-23 DIAGNOSIS — Z7982 Long term (current) use of aspirin: Secondary | ICD-10-CM | POA: Diagnosis not present

## 2020-04-23 DIAGNOSIS — I10 Essential (primary) hypertension: Secondary | ICD-10-CM | POA: Diagnosis not present

## 2020-04-24 DIAGNOSIS — Z9181 History of falling: Secondary | ICD-10-CM | POA: Diagnosis not present

## 2020-04-24 DIAGNOSIS — M545 Low back pain: Secondary | ICD-10-CM | POA: Diagnosis not present

## 2020-04-24 DIAGNOSIS — I69391 Dysphagia following cerebral infarction: Secondary | ICD-10-CM | POA: Diagnosis not present

## 2020-04-24 DIAGNOSIS — I10 Essential (primary) hypertension: Secondary | ICD-10-CM | POA: Diagnosis not present

## 2020-04-24 DIAGNOSIS — I69354 Hemiplegia and hemiparesis following cerebral infarction affecting left non-dominant side: Secondary | ICD-10-CM | POA: Diagnosis not present

## 2020-04-24 DIAGNOSIS — G894 Chronic pain syndrome: Secondary | ICD-10-CM | POA: Diagnosis not present

## 2020-04-24 DIAGNOSIS — I051 Rheumatic mitral insufficiency: Secondary | ICD-10-CM | POA: Diagnosis not present

## 2020-04-24 DIAGNOSIS — R1312 Dysphagia, oropharyngeal phase: Secondary | ICD-10-CM | POA: Diagnosis not present

## 2020-04-24 DIAGNOSIS — E785 Hyperlipidemia, unspecified: Secondary | ICD-10-CM | POA: Diagnosis not present

## 2020-04-24 DIAGNOSIS — F17210 Nicotine dependence, cigarettes, uncomplicated: Secondary | ICD-10-CM | POA: Diagnosis not present

## 2020-04-24 DIAGNOSIS — I69328 Other speech and language deficits following cerebral infarction: Secondary | ICD-10-CM | POA: Diagnosis not present

## 2020-04-24 DIAGNOSIS — Z7982 Long term (current) use of aspirin: Secondary | ICD-10-CM | POA: Diagnosis not present

## 2020-04-24 DIAGNOSIS — I69392 Facial weakness following cerebral infarction: Secondary | ICD-10-CM | POA: Diagnosis not present

## 2020-04-25 DIAGNOSIS — E785 Hyperlipidemia, unspecified: Secondary | ICD-10-CM | POA: Diagnosis not present

## 2020-04-25 DIAGNOSIS — I051 Rheumatic mitral insufficiency: Secondary | ICD-10-CM | POA: Diagnosis not present

## 2020-04-25 DIAGNOSIS — I69391 Dysphagia following cerebral infarction: Secondary | ICD-10-CM | POA: Diagnosis not present

## 2020-04-25 DIAGNOSIS — F17210 Nicotine dependence, cigarettes, uncomplicated: Secondary | ICD-10-CM | POA: Diagnosis not present

## 2020-04-25 DIAGNOSIS — I69354 Hemiplegia and hemiparesis following cerebral infarction affecting left non-dominant side: Secondary | ICD-10-CM | POA: Diagnosis not present

## 2020-04-25 DIAGNOSIS — G894 Chronic pain syndrome: Secondary | ICD-10-CM | POA: Diagnosis not present

## 2020-04-25 DIAGNOSIS — M545 Low back pain: Secondary | ICD-10-CM | POA: Diagnosis not present

## 2020-04-25 DIAGNOSIS — I69392 Facial weakness following cerebral infarction: Secondary | ICD-10-CM | POA: Diagnosis not present

## 2020-04-25 DIAGNOSIS — I69328 Other speech and language deficits following cerebral infarction: Secondary | ICD-10-CM | POA: Diagnosis not present

## 2020-04-25 DIAGNOSIS — R1312 Dysphagia, oropharyngeal phase: Secondary | ICD-10-CM | POA: Diagnosis not present

## 2020-04-25 DIAGNOSIS — Z9181 History of falling: Secondary | ICD-10-CM | POA: Diagnosis not present

## 2020-04-25 DIAGNOSIS — I10 Essential (primary) hypertension: Secondary | ICD-10-CM | POA: Diagnosis not present

## 2020-04-25 DIAGNOSIS — Z7982 Long term (current) use of aspirin: Secondary | ICD-10-CM | POA: Diagnosis not present

## 2020-05-01 ENCOUNTER — Ambulatory Visit: Payer: BC Managed Care – PPO | Attending: Physical Medicine & Rehabilitation

## 2020-05-01 ENCOUNTER — Other Ambulatory Visit: Payer: Self-pay

## 2020-05-01 ENCOUNTER — Ambulatory Visit: Payer: BC Managed Care – PPO | Admitting: Occupational Therapy

## 2020-05-01 ENCOUNTER — Other Ambulatory Visit: Payer: Self-pay | Admitting: Physical Medicine & Rehabilitation

## 2020-05-01 DIAGNOSIS — M79602 Pain in left arm: Secondary | ICD-10-CM | POA: Insufficient documentation

## 2020-05-01 DIAGNOSIS — I63511 Cerebral infarction due to unspecified occlusion or stenosis of right middle cerebral artery: Secondary | ICD-10-CM

## 2020-05-01 DIAGNOSIS — R209 Unspecified disturbances of skin sensation: Secondary | ICD-10-CM | POA: Diagnosis not present

## 2020-05-01 DIAGNOSIS — R2681 Unsteadiness on feet: Secondary | ICD-10-CM | POA: Insufficient documentation

## 2020-05-01 DIAGNOSIS — G8110 Spastic hemiplegia affecting unspecified side: Secondary | ICD-10-CM | POA: Diagnosis not present

## 2020-05-01 DIAGNOSIS — R208 Other disturbances of skin sensation: Secondary | ICD-10-CM

## 2020-05-01 DIAGNOSIS — M6281 Muscle weakness (generalized): Secondary | ICD-10-CM | POA: Insufficient documentation

## 2020-05-01 DIAGNOSIS — I69354 Hemiplegia and hemiparesis following cerebral infarction affecting left non-dominant side: Secondary | ICD-10-CM

## 2020-05-01 DIAGNOSIS — R2689 Other abnormalities of gait and mobility: Secondary | ICD-10-CM | POA: Insufficient documentation

## 2020-05-01 NOTE — Therapy (Signed)
[facility] [address] [place], [place], [address] Phone: [phone]   Fax:  [phone]  Occupational Therapy Treatment  Patient Details  Name: Derek [name] MRN: [id number] Date of Birth: 1976-08-22 Referring Provider (OT): Dr. Jacklynn Lewis   Encounter Date: 05/01/2020   OT End of Session - 05/01/20 1716    Visit Number 1    Number of Visits 17    Date for OT Re-Evaluation 07/02/20    Authorization Type BC/BS - 60 combined visits for all disciplines    Authorization - Visit Number 1    Authorization - Number of Visits 30    OT Start Time 1615    OT Stop Time 1700    OT Time Calculation (min) 45 min    Activity Tolerance Patient limited by pain    Behavior During Therapy [facility] for tasks assessed/performed           Past Medical History:  Diagnosis Date  . 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 Alcohol dependence (HCC)   . Back pain   . Marijuana dependence (HCC)   . Tobacco dependence     Past Surgical History:  Procedure Laterality Date  . BUBBLE STUDY  01/10/2020   Procedure: BUBBLE STUDY;  Surgeon: Sande Rives, MD;  Location: [facility] ENDOSCOPY;  Service: Cardiovascular;;  . TEE WITHOUT CARDIOVERSION N/A 01/10/2020   Procedure: TRANSESOPHAGEAL ECHOCARDIOGRAM (TEE);  Surgeon: Sande Rives, MD;  Location: [facility] ENDOSCOPY;  Service: Cardiovascular;  Laterality: N/A;    There were no vitals filed for this visit.   Subjective Assessment - 05/01/20 1620    Pertinent History CVA 01/07/20 w/ residual Lt hemiparesis. PMH: HTN, ETOH abuse, smoker    Limitations fall risk    Currently in Pain? Yes    Pain Score 5    at rest, but up to 10/10 with attempted passive sh flexion   Pain Location Arm   shoulder and upper arm   Pain Orientation Left    Pain Descriptors / Indicators Aching    Pain Type Acute pain    Pain Onset More than a month ago    Pain Frequency Intermittent     Aggravating Factors  malpositioning    Pain Relieving Factors prescribed meds              [facility] OT Assessment - 05/01/20 0001      Assessment   Medical Diagnosis Rt MCA CVA    Referring Provider (OT) Dr. Jacklynn Lewis    Onset Date/Surgical Date 01/07/20    Hand Dominance Right    Next MD Visit 06/18/20    Prior Therapy CIR, HH therapy      Precautions   Precautions Fall      Home  Environment   Bathroom Shower/Tub Tub/Shower unit;Curtain    Bathroom Accessibility Yes    Home Equipment Tub bench;Bedside commode;Wheelchair - manual   hemi walker   Additional Comments Pt lives w/ girlfriend in 2 story house w/ ramp to enter (pt cannot go down to basement for laundry)     Lives With Significant other      Prior Function   Level of Independence Independent    Vocation Full time employment   filing for disability   Vocation Requirements long distance truck driver     Leisure traveling, walking dog      ADL   Eating/Feeding Needs assist with cutting food    Grooming Modified independent  Upper Body Bathing Moderate assistance    Lower Body Bathing Moderate assistance    Upper Body Dressing Moderate assistance    Lower Body Dressing Maximal assistance   dependent for tying shoes   Toilet Transfer Modified independent   with BSC over toilet to raise it   Toileting - Clothing Manipulation Maximal assistance    Toileting -  Hygiene Maximal assistance    Tub/Shower Transfer Moderate assistance   dependent for leg management   ADL comments dependent for all IADLS      Mobility   Mobility Status Needs assist    Mobility Status Comments Walks w/ hemi walker or uses w/c in house, w/c for community      Written Expression   Dominant Hand Right      Vision - History   Baseline Vision No visual deficits      Vision Assessment   Comment Pt reports decreased acuity      Cognition   Cognition Comments Pt reports memory changes      Observation/Other Assessments    Observations dense hemiplegia LUE      Posture/Postural Control   Posture/Postural Control Postural limitations    Postural Limitations Flexed trunk    Posture Comments anterior shoulder subluxation, little to no scapula movement LT      Sensation   Light Touch Impaired Detail    Additional Comments Pt unable to detect/localize light touch      Coordination   Gross Motor Movements are Fluid and Coordinated No    Fine Motor Movements are Fluid and Coordinated No    Coordination No use of LUE      Edema   Edema mild to moderate Lt hand      Tone   Assessment Location Left Upper Extremity      ROM / Strength   AROM / PROM / Strength AROM;PROM      AROM   Overall AROM Comments no A/ROM LUE      PROM   Overall PROM Comments Pt can tolerate 80% finger flex, full finger extension, wrist ext Lt hand, 75% passive elbow flex/ext, but pain limits any passive shoulder motion      LUE Tone   LUE Tone Moderate;Severe;Hypertonic   worse proximally at elbow and shoulder     LUE Tone   Hypertonic Details 2/4 fingers and wrist, 3/4 for elbow extension, only minimal passive tolerance to shoulder                    OT Treatments/Exercises (OP) - 05/01/20 0001      ADLs   Toileting Pt needed assist at end of evaluation with toileting and clothes management - pt could doff pants in standing but required max assist to don pants                  OT Education - 05/01/20 1732    Education Details bed positioning    Person(s) Educated Patient    Methods Explanation;Handout    Comprehension Verbalized understanding            OT Short Term Goals - 05/01/20 1723      OT SHORT TERM GOAL #1   Title Independent with HEP for LUE    Time 4    Period Weeks    Status New      OT SHORT TERM GOAL #2   Title Pt to be independent with splint wear and care prn    Time 4  Period Weeks    Status New      OT SHORT TERM GOAL #3   Title Pt to verbalize understanding with  pain management strategies and proper positioning of LUE    Time 4    Period Weeks    Status New      OT SHORT TERM GOAL #4   Title Pt to bathe self with min assist seated    Baseline mod assist seated    Time 4    Period Weeks    Status New      OT SHORT TERM GOAL #5   Title Pt to perform UE dressing with min assist and LE dressing with mod assist    Baseline mod assist/max assist    Time 4    Period Weeks    Status New      Additional Short Term Goals   Additional Short Term Goals Yes      OT SHORT TERM GOAL #6   Title Pt to tolerate passive shoulder flexion to 90* supine w/ pain 5/10 or under    Time 4    Period Weeks    Status New             OT Long Term Goals - 05/01/20 1728      OT LONG TERM GOAL #1   Title Pt to tolerate 75% PROM LUE with pain 3/10 or under    Time 8    Period Weeks    Status New      OT LONG TERM GOAL #2   Title Pt to use LUE as stabalizer for bilateral tasks    Time 8    Period Weeks    Status New      OT LONG TERM GOAL #3   Title Pt to verbalize understanding with potential A/E needs and task modifications to increase ease with ADLS and simple IADLS    Time 8    Period Weeks    Status New      OT LONG TERM GOAL #4   Title Pt to be mod I level with bathing and UE dressing and min assist for LE dressing    Time 8    Period Weeks    Status New      OT LONG TERM GOAL #5   Title Pt to perform toileting at mod I level    Time 8    Period Weeks    Status New      Long Term Additional Goals   Additional Long Term Goals Yes      OT LONG TERM GOAL #6   Title Pt to perform simple snack prep/sandwich prep from w/c level prn or w/ use of walker and walker tray    Time 8    Period Weeks    Status New                 Plan - 05/01/20 1717    Clinical Impression Statement Pt is a 44 y.o. male who presents to OPOT for evaluation s/p Rt MCA CVA on 01/07/20 with dense hemiplegia LUE greater than LE and spasticity. Pt with no  A/ROM and very little P/ROM limited by pain LUE, decreased sensation, spasticity, pain in shoulder, and decreased ability to perform ADLS. Pt would benefit from O.T. to address these deficits, help with pain management, and increase ease and independence with ADLS. Pt scheduled for botox injections LUE in August.    OT Occupational Profile and History Detailed  Assessment- Review of Records and additional review of physical, cognitive, psychosocial history related to current functional performance    Occupational performance deficits (Please refer to evaluation for details): ADL's;IADL's;Rest and Sleep;Leisure;Social Participation    Body Structure / Function / Physical Skills ADL;ROM;Dexterity;Edema;Balance;IADL;Body mechanics;Improper spinal/pelvic alignment;Sensation;Mobility;Strength;Muscle spasms;Coordination;Tone;Pain;UE functional use;Decreased knowledge of use of DME;Vision    Rehab Potential Fair   severity of deficits   Clinical Decision Making Several treatment options, min-mod task modification necessary    Comorbidities Affecting Occupational Performance: May have comorbidities impacting occupational performance    Modification or Assistance to Complete Evaluation  No modification of tasks or assist necessary to complete eval    OT Frequency 2x / week    OT Duration 8 weeks   plus eval   OT Treatment/Interventions Self-care/ADL training;Therapeutic exercise;Functional Mobility Training;Aquatic Therapy;Neuromuscular education;Manual Therapy;Splinting;Therapeutic activities;Coping strategies training;DME and/or AE instruction;Cognitive remediation/compensation;Electrical Stimulation;Visual/perceptual remediation/compensation;Moist Heat;Passive range of motion;Patient/family education    Plan initiate HEP    Consulted and Agree with Plan of Care Patient           Patient will benefit from skilled therapeutic intervention in order to improve the following deficits and impairments:   Body  Structure / Function / Physical Skills: ADL, ROM, Dexterity, Edema, Balance, IADL, Body mechanics, Improper spinal/pelvic alignment, Sensation, Mobility, Strength, Muscle spasms, Coordination, Tone, Pain, UE functional use, Decreased knowledge of use of DME, Vision       Visit Diagnosis: Hemiplegia and hemiparesis following cerebral infarction affecting left non-dominant side (HCC)  Spastic hemiplegia affecting nondominant side (HCC)  Pain in left arm  Unsteadiness on feet  Other disturbances of skin sensation    Problem List Patient Active Problem List   Diagnosis Date Noted  . Neuropathic pain 04/09/2020  . Pain   . Spastic hemiparesis (HCC)   . History of hypertension   . Dyslipidemia   . 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 Leukocytosis 01/12/2020  . Cerebrovascular accident (CVA) due to thrombosis of right middle cerebral artery (HCC)   . Chronic pain syndrome   . Hypokalemia   . Dysphagia, post-stroke   . 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 Tobacco dependence   . Marijuana dependence (HCC)   . Alcohol dependence (HCC)     Kelli Churn, OTR/L 05/01/2020, 5:36 PM  [facility] [address] [place], [place], [address] Phone: [phone]   Fax:  [phone]  Name: DEMETRION WESBY MRN: [id number] Date of Birth: January 25, 1976

## 2020-05-01 NOTE — Therapy (Signed)
[facility] [address] [place], [place], [address] Phone: [phone]   Fax:  [phone]  Physical Therapy Evaluation  Patient Details  Name: Derek [name] MRN: [id number] Date of Birth: September 14, 1976 Referring Provider (PT): Faith Rogue, MD   Encounter Date: 05/01/2020   PT End of Session - 05/01/20 1752    Visit Number 1    Number of Visits 17    Date for PT Re-Evaluation 07/30/20   POC for 8 weeks, Cert for 90 days   Authorization Type BCBS    PT Start Time 1703    PT Stop Time 1748    PT Time Calculation (min) 45 min    Equipment Utilized During Treatment Gait belt    Activity Tolerance Patient tolerated treatment well    Behavior During Therapy [facility] for tasks assessed/performed;Flat affect           Past Medical History:  Diagnosis Date  . 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 Alcohol dependence (HCC)   . Back pain   . Marijuana dependence (HCC)   . Tobacco dependence     Past Surgical History:  Procedure Laterality Date  . BUBBLE STUDY  01/10/2020   Procedure: BUBBLE STUDY;  Surgeon: Sande Rives, MD;  Location: [facility] ENDOSCOPY;  Service: Cardiovascular;;  . TEE WITHOUT CARDIOVERSION N/A 01/10/2020   Procedure: TRANSESOPHAGEAL ECHOCARDIOGRAM (TEE);  Surgeon: Sande Rives, MD;  Location: [facility] ENDOSCOPY;  Service: Cardiovascular;  Laterality: N/A;    There were no vitals filed for this visit.    Subjective Assessment - 05/01/20 1706    Subjective Patient reports that he went to hospital on 01/07/20  with left sided - weakness. Recieved Inpatient rehab services and was discharged home on 02/13/20. Recieved home health therapy services and those stopped last weak. Patient reports been using a hemiwalker in the house. Patient reports he has been using a bedside commode. Using manual wheelchair in the community and some in the house.    Pertinent History Alcohol and Tobacco Use      Limitations Standing;Walking;Sitting    How long can you stand comfortably? 3-5 minutes    Currently in Pain? Yes    Pain Score 5     Pain Location Arm    Pain Orientation Left    Pain Descriptors / Indicators Aching    Pain Type Acute pain    Pain Onset More than a month ago    Pain Frequency Intermittent    Aggravating Factors  certain positions    Pain Relieving Factors medications              [facility] PT Assessment - 05/01/20 1713      Assessment   Medical Diagnosis Rt MCA CVA    Referring Provider (PT) Faith Rogue, MD    Onset Date/Surgical Date 01/07/20    Hand Dominance Right    Next MD Visit 06/18/20    Prior Therapy Inpatient Rehab, [facility] Therapy      Precautions   Precautions Fall      Balance Screen   Has the patient fallen in the past 6 months No    Has the patient had a decrease in activity level because of a fear of falling?  No    Is the patient reluctant to leave their home because of a fear of falling?  No      Home [employer]ist information centre manager residence    Living Arrangements Spouse/significant  other    Available Help at Discharge Family    Type of Home House    Home Access Ramped entrance    Home Layout Multi-level   basement; does not go to basement at this time   Home Equipment Wheelchair - manual;Bedside commode;Tub bench;Grab bars - tub/shower      Prior Function   Level of Independence Independent    Vocation Full time employment    Vocation Requirements truck driver    Leisure traveling       Cognition   Overall Cognitive Status Within Functional Limits for tasks assessed      Sensation   Light Touch Impaired by gross assessment    Light Touch Impaired Details Impaired LLE    Additional Comments Pt reports dimished sensation on LLE.       Coordination   Gross Motor Movements are Fluid and Coordinated No      Tone   Assessment Location Left Lower Extremity      ROM / Strength   AROM / PROM / Strength Strength;AROM       AROM   Overall AROM  Deficits    Overall AROM Comments decreased ROM in LLE due to tone and strength deficits      Strength   Overall Strength Deficits    Strength Assessment Site Hip;Knee;Ankle    Right/Left Hip Right;Left    Right Hip Flexion 4+/5    Right Hip ABduction 4+/5    Right Hip ADduction 4+/5    Left Hip Flexion 2+/5    Left Hip ABduction 2+/5    Left Hip ADduction 2+/5    Right/Left Knee Right;Left    Right Knee Flexion 4+/5    Right Knee Extension 4+/5    Left Knee Flexion 2/5    Left Knee Extension 2/5    Right/Left Ankle Right;Left    Right Ankle Dorsiflexion 4+/5    Left Ankle Dorsiflexion 1/5      Transfers   Transfers Sit to Stand;Stand to Sit;Stand Pivot Transfers    Sit to Stand 4: Min guard;4: Min assist    Sit to Stand Details Verbal cues for technique    Stand to Sit 4: Min guard;4: Min assist    Stand to Sit Details (indicate cue type and reason) Verbal cues for technique;Verbal cues for safe use of DME/AE    Stand Pivot Transfers 4: Min guard;4: Min assist    Comments completed transfer from w/c <> mat with hemiwalker, patient require Min A intermittently, CGA throughout.       Ambulation/Gait   Ambulation/Gait Yes    Ambulation/Gait Assistance 4: Min assist    Ambulation Distance (Feet) 75 Feet   x 2   Assistive device Hemi-walker    Gait Pattern Step-through pattern;Decreased step length - left;Decreased stance time - left;Decreased step length - right;Decreased hip/knee flexion - left;Decreased dorsiflexion - left    Ambulation Surface Level;Indoor    Gait Comments With ambulation, patient able complete alternating gait pattern with hemiwalker, however excessive lean to R side with use of hemi-walker noted.       Balance   Balance Assessed Yes      Static Standing Balance   Static Standing - Balance Support No upper extremity supported    Static Standing - Level of Assistance 5: Stand by assistance    Static Standing - Comment/# of  Minutes <45 seconds standing w/o UE support, CGA for steadying required by PT.       Standardized Balance  Assessment   Standardized Balance Assessment Timed Up and Go Test      Timed Up and Go Test   TUG Normal TUG    Normal TUG (seconds) 58.66    TUG Comments completed w/ CGA and hemi-walker      LLE Tone   LLE Tone Moderate;Hypertonic;Modified Ashworth      LLE Tone   Modified Ashworth Scale for Grading Hypertonia LLE More marked increase in muscle tone through most of the ROM, but affected part(s) easily moved                      Objective measurements completed on examination: See above findings.               PT Education - 05/01/20 1754    Education Details Patient educated on evaluation findings, POC    Person(s) Educated Patient    Methods Explanation    Comprehension Verbalized understanding            PT Short Term Goals - 05/01/20 1948      PT SHORT TERM GOAL #1   Title Patient will be independent with Initial HEP with caregiver assitance (for BLE strengthening/stretching)    Time 4    Period Weeks    Status New    Target Date 05/29/20      PT SHORT TERM GOAL #2   Title Patient will demonstrate ability to complete transfer from w/c <> mat with LRAD, supervision level to demo improved independence with functional mobility    Baseline Min A    Time 4    Period Weeks    Status New    Target Date 05/29/20      PT SHORT TERM GOAL #3   Title Patient will demonstrate ability to ambulate at least 150 ft w/ LRAD and CGA to demonstrate improved functional mobility    Baseline Min A 75 ft    Time 4    Period Weeks    Status New    Target Date 05/29/20      PT SHORT TERM GOAL #4   Title Patient will improve TUG to </= 45 secs w/ LRAD to demo reduced risk for falls    Time 4    Period Weeks    Status New    Target Date 05/29/20             PT Long Term Goals - 05/01/20 1951      PT LONG TERM GOAL #1   Title Patient will be  independent with Final HEP    Time 8    Period Weeks    Status New    Target Date 06/26/20      PT LONG TERM GOAL #2   Title Patient will demonstrate ability to ambulate > 250 ft w/ LRAD and Supv to demo improved community mobility    Time 8    Period Weeks    Status New    Target Date 06/26/20      PT LONG TERM GOAL #3   Title Patient will improve TUG to </= 35 secs w/ LRAD to demo reduced risk for falls    Baseline 58.66 secs w/ hemiwalker    Time 8    Period Weeks    Status New      PT LONG TERM GOAL #4   Title Patient will demo ability to ambulate at gait speed of >/= 2.0 ft/sec to demo improved functional mobility  Time 8    Period Weeks    Status New    Target Date 06/26/20      PT LONG TERM GOAL #5   Title Patient will demo ability to complete all bed mobility and transfers, Mod I, for improved independence    Time 8    Period Weeks    Status New                  Plan - 05/01/20 1910    Clinical Impression Statement Pt is a 44 y.o. male that was referred to Neuro OPPT for services for R MCA CVA that occurred on 01/07/2020. Patient's PMH is significant for the following: HTN, ETOH abuse, and history of tobacco use. Patient presents with spastic hemiparesis of the LUE/LLE and the following deficits: abnormal gait, decreased functional mobility, decreased strength, decreased balance, abnormal tone, and increased risk for falls. Patient is currently ambulating with a hemi-walker and require Min A for functional mobility. Patient will benefit from skilled PT services to maximize functional mobility and reduce fall risk.    Personal Factors and Comorbidities Comorbidity 2    Comorbidities Chronic Back Pain,HTN, Prior History of Alcohol/Tobacco Use    Examination-Activity Limitations Bed Mobility;Stairs;Stand;Toileting;Transfers;Dressing    Examination-Participation Restrictions Community Activity;Driving;Yard Work;Other   Occupation   Stability/Clinical Decision  Making Evolving/Moderate complexity    Clinical Decision Making Moderate    Rehab Potential Good    PT Frequency 2x / week    PT Duration 8 weeks    PT Treatment/Interventions ADLs/Self Care Home Management;Electrical Stimulation;Aquatic Therapy;Moist Heat;DME Instruction;Gait training;Stair training;Functional mobility training;Therapeutic activities;Therapeutic exercise;Balance training;Neuromuscular re-education;Patient/family education;Orthotic Fit/Training;Wheelchair mobility training;Manual techniques;Passive range of motion    PT Next Visit Plan Initiate supine HEP. Gait training. Potentially attempt RW with hand grip?    Consulted and Agree with Plan of Care Patient           Patient will benefit from skilled therapeutic intervention in order to improve the following deficits and impairments:  Abnormal gait, Decreased balance, Decreased endurance, Decreased mobility, Difficulty walking, Impaired tone, Impaired sensation, Pain, Impaired UE functional use, Impaired flexibility, Decreased strength, Decreased knowledge of use of DME, Decreased activity tolerance, Decreased coordination, Decreased range of motion, Increased muscle spasms  Visit Diagnosis: Spastic hemiplegia of left nondominant 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 (HCC)  Other abnormalities of gait and mobility  Muscle weakness (generalized)  Unsteadiness on feet     Problem List Patient Active Problem List   Diagnosis Date Noted  . Neuropathic pain 04/09/2020  . Pain   . Spastic hemiparesis (HCC)   . History of hypertension   . Dyslipidemia   . 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 Leukocytosis 01/12/2020  . Cerebrovascular accident (CVA) due to thrombosis of right middle cerebral artery (HCC)   . Chronic pain syndrome   . Hypokalemia   . Dysphagia, post-stroke   . 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 Tobacco dependence   . Marijuana dependence (HCC)   . Alcohol  dependence (HCC)     Tempie Donning, PT, DPT 05/01/2020, 8:01 PM   [facility] [address] [place], [place], [address] Phone: [phone]   Fax:  [phone]  Name: Derek [name] MRN: [id number] Date of Birth: 01-Nov-1976

## 2020-05-07 ENCOUNTER — Other Ambulatory Visit: Payer: Self-pay | Admitting: Physical Medicine & Rehabilitation

## 2020-05-07 DIAGNOSIS — I63511 Cerebral infarction due to unspecified occlusion or stenosis of right middle cerebral artery: Secondary | ICD-10-CM

## 2020-05-08 ENCOUNTER — Ambulatory Visit: Payer: BC Managed Care – PPO

## 2020-05-08 ENCOUNTER — Other Ambulatory Visit: Payer: Self-pay

## 2020-05-08 DIAGNOSIS — M6281 Muscle weakness (generalized): Secondary | ICD-10-CM

## 2020-05-08 DIAGNOSIS — I69354 Hemiplegia and hemiparesis following cerebral infarction affecting left non-dominant side: Secondary | ICD-10-CM

## 2020-05-08 DIAGNOSIS — M79602 Pain in left arm: Secondary | ICD-10-CM | POA: Diagnosis not present

## 2020-05-08 DIAGNOSIS — R2689 Other abnormalities of gait and mobility: Secondary | ICD-10-CM

## 2020-05-08 DIAGNOSIS — R209 Unspecified disturbances of skin sensation: Secondary | ICD-10-CM | POA: Diagnosis not present

## 2020-05-08 DIAGNOSIS — R2681 Unsteadiness on feet: Secondary | ICD-10-CM | POA: Diagnosis not present

## 2020-05-08 DIAGNOSIS — G8110 Spastic hemiplegia affecting unspecified side: Secondary | ICD-10-CM | POA: Diagnosis not present

## 2020-05-08 NOTE — Therapy (Signed)
[facility] [address] [place], [place], [address] Phone: [phone]   Fax:  [phone]  Physical Therapy Treatment  Patient Details  Name: Derek [name] MRN: [id number] Date of Birth: May 30, 1976 Referring Provider (PT): Faith Rogue, MD   Encounter Date: 05/08/2020   PT End of Session - 05/08/20 1742    Visit Number 2    Number of Visits 17    Date for PT Re-Evaluation 07/30/20   POC for 8 weeks, Cert for 90 days   Authorization Type BCBS    PT Start Time 1736    PT Stop Time 1821    PT Time Calculation (min) 45 min    Equipment Utilized During Treatment Gait belt    Activity Tolerance Patient tolerated treatment well    Behavior During Therapy [facility] for tasks assessed/performed;Flat affect           Past Medical History:  Diagnosis Date  . 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 Alcohol dependence (HCC)   . Back pain   . Marijuana dependence (HCC)   . Tobacco dependence     Past Surgical History:  Procedure Laterality Date  . BUBBLE STUDY  01/10/2020   Procedure: BUBBLE STUDY;  Surgeon: Sande Rives, MD;  Location: [facility] ENDOSCOPY;  Service: Cardiovascular;;  . TEE WITHOUT CARDIOVERSION N/A 01/10/2020   Procedure: TRANSESOPHAGEAL ECHOCARDIOGRAM (TEE);  Surgeon: Sande Rives, MD;  Location: [facility] ENDOSCOPY;  Service: Cardiovascular;  Laterality: N/A;    There were no vitals filed for this visit.   Subjective Assessment - 05/08/20 1740    Subjective Patient reports that doing the same since last visit. No falls to report.    Pertinent History Alcohol and Tobacco Use    Limitations Standing;Walking;Sitting    How long can you stand comfortably? 3-5 minutes    Currently in Pain? Yes    Pain Score 8     Pain Location Shoulder    Pain Orientation Left    Pain Descriptors / Indicators --   feels like a bee sting   Pain Type Acute pain    Pain Onset More than a month ago                               [facility] Adult PT Treatment/Exercise - 05/08/20 0001      Transfers   Transfers Sit to Stand;Stand to Sit;Stand Pivot Transfers    Sit to Stand 4: Min guard    Sit to Stand Details Verbal cues for technique    Stand to Sit 4: Min guard    Stand to Sit Details (indicate cue type and reason) Verbal cues for technique;Verbal cues for safe use of DME/AE    Stand Pivot Transfers 4: Min guard    Comments completed transfer from w/c <> mat with hemiwalker. CGA. Patient able to complete x 5 reps of sit <> stands from mat and maintain balance without UE support by hemiwalker      Ambulation/Gait   Ambulation/Gait Yes    Ambulation/Gait Assistance 4: Min guard;4: Min assist    Ambulation/Gait Assistance Details completed gait training in today's session. Attempted gait training with RW with L hand grip attachment but patient reports pain in L arm with this AD. Conitnued gait training with patient's personal hemiwalker, completed x 65 ft, followed by x 195 ft. Patient require Min A intermittent for LLE placement.  Ambulation Distance (Feet) 195 Feet   65 x 1, 10 x 1   Assistive device Hemi-walker;Rolling walker    Gait Pattern Step-through pattern;Decreased step length - left;Decreased stance time - left;Decreased step length - right;Decreased hip/knee flexion - left;Decreased dorsiflexion - left    Ambulation Surface Level;Indoor          The following exercises were completed in the session and included on initial HEP. PT providing education on proper completion of each exercise for ability to complete at home properly.   Access Code: [email or web address] URL: [email or web address] Date: 05/08/2020 Prepared by: Jethro Bastos  Exercises Supine Heel Slide - 1 x daily - 5 x weekly - 3 sets - 5 reps Supine Bridge - 1 x daily - 5 x weekly - 3 sets - 5 reps Bent Knee Fallouts - 1 x daily - 5 x weekly - 3 sets - 5 reps Sit to Stand with Armchair - 1  x daily - 5 x weekly - 3 sets - 5 reps  With the sit <> stands, PT educating patient to have someone with him for safety concerns at this time.         PT Education - 05/08/20 1919    Education Details educated on initial HEP    Person(s) Educated Patient    Methods Explanation;Demonstration;Handout    Comprehension Verbalized understanding;Returned demonstration            PT Short Term Goals - 05/01/20 1948      PT SHORT TERM GOAL #1   Title Patient will be independent with Initial HEP with caregiver assitance (for BLE strengthening/stretching)    Time 4    Period Weeks    Status New    Target Date 05/29/20      PT SHORT TERM GOAL #2   Title Patient will demonstrate ability to complete transfer from w/c <> mat with LRAD, supervision level to demo improved independence with functional mobility    Baseline Min A    Time 4    Period Weeks    Status New    Target Date 05/29/20      PT SHORT TERM GOAL #3   Title Patient will demonstrate ability to ambulate at least 150 ft w/ LRAD and CGA to demonstrate improved functional mobility    Baseline Min A 75 ft    Time 4    Period Weeks    Status New    Target Date 05/29/20      PT SHORT TERM GOAL #4   Title Patient will improve TUG to </= 45 secs w/ LRAD to demo reduced risk for falls    Time 4    Period Weeks    Status New    Target Date 05/29/20             PT Long Term Goals - 05/01/20 1951      PT LONG TERM GOAL #1   Title Patient will be independent with Final HEP    Time 8    Period Weeks    Status New    Target Date 06/26/20      PT LONG TERM GOAL #2   Title Patient will demonstrate ability to ambulate > 250 ft w/ LRAD and Supv to demo improved community mobility    Time 8    Period Weeks    Status New    Target Date 06/26/20      PT LONG TERM GOAL #3   Title Patient will improve  TUG to </= 35 secs w/ LRAD to demo reduced risk for falls    Baseline 58.66 secs w/ hemiwalker    Time 8    Period  Weeks    Status New      PT LONG TERM GOAL #4   Title Patient will demo ability to ambulate at gait speed of >/= 2.0 ft/sec to demo improved functional mobility    Time 8    Period Weeks    Status New    Target Date 06/26/20      PT LONG TERM GOAL #5   Title Patient will demo ability to complete all bed mobility and transfers, Mod I, for improved independence    Time 8    Period Weeks    Status New                 Plan - 05/08/20 1922    Clinical Impression Statement Today's skilled PT session focused on initating supine HEP to allow for completion at home. Followed with gait training, attempted gait with RW with L hand grip attachment but walked <10 ft due pain in LUE. Continued gait training with hemiwalker, with patient able to ambulate 195 ft today with intermittent Min A.    Personal Factors and Comorbidities Comorbidity 2    Comorbidities Chronic Back Pain,HTN, Prior History of Alcohol/Tobacco Use    Examination-Activity Limitations Bed Mobility;Stairs;Stand;Toileting;Transfers;Dressing    Examination-Participation Restrictions Community Activity;Driving;Yard Work;Other   Occupation   Stability/Clinical Decision Making Evolving/Moderate complexity    Rehab Potential Good    PT Frequency 2x / week    PT Duration 8 weeks    PT Treatment/Interventions ADLs/Self Care Home Management;Electrical Stimulation;Aquatic Therapy;Moist Heat;DME Instruction;Gait training;Stair training;Functional mobility training;Therapeutic activities;Therapeutic exercise;Balance training;Neuromuscular re-education;Patient/family education;Orthotic Fit/Training;Wheelchair mobility training;Manual techniques;Passive range of motion    PT Next Visit Plan How is HEP? Strengthening Exercises for LLE. Gait training. Tall Kneeling, Activites for tone management.    Consulted and Agree with Plan of Care Patient           Patient will benefit from skilled therapeutic intervention in order to improve the  following deficits and impairments:  Abnormal gait, Decreased balance, Decreased endurance, Decreased mobility, Difficulty walking, Impaired tone, Impaired sensation, Pain, Impaired UE functional use, Impaired flexibility, Decreased strength, Decreased knowledge of use of DME, Decreased activity tolerance, Decreased coordination, Decreased range of motion, Increased muscle spasms  Visit Diagnosis: Hemiplegia and hemiparesis following cerebral infarction affecting left non-dominant side (HCC)  Unsteadiness on feet  Other abnormalities of gait and mobility  Muscle weakness (generalized)     Problem List Patient Active Problem List   Diagnosis Date Noted  . Neuropathic pain 04/09/2020  . Pain   . Spastic hemiparesis (HCC)   . History of hypertension   . Dyslipidemia   . 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 Leukocytosis 01/12/2020  . Cerebrovascular accident (CVA) due to thrombosis of right middle cerebral artery (HCC)   . Chronic pain syndrome   . Hypokalemia   . Dysphagia, post-stroke   . 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 Tobacco dependence   . Marijuana dependence (HCC)   . Alcohol dependence (HCC)     Tempie Donning, PT, DPT 05/08/2020, 7:24 PM  [facility] [address] [place], [place], [address] Phone: [phone]   Fax:  [phone]  Name: ONIX JUMPER MRN: [id number] Date of Birth: Feb 10, 1976

## 2020-05-08 NOTE — Patient Instructions (Signed)
Access Code: [email or web address] URL: [email or web address] Date: 05/08/2020 Prepared by: Jethro Bastos  Exercises Supine Heel Slide - 1 x daily - 5 x weekly - 3 sets - 5 reps Supine Bridge - 1 x daily - 5 x weekly - 3 sets - 5 reps Bent Knee Fallouts - 1 x daily - 5 x weekly - 3 sets - 5 reps Sit to Stand with Armchair - 1 x daily - 5 x weekly - 3 sets - 5 reps

## 2020-05-09 DIAGNOSIS — I63511 Cerebral infarction due to unspecified occlusion or stenosis of right middle cerebral artery: Secondary | ICD-10-CM | POA: Diagnosis not present

## 2020-05-14 ENCOUNTER — Ambulatory Visit: Payer: BC Managed Care – PPO | Admitting: Physical Medicine & Rehabilitation

## 2020-05-14 ENCOUNTER — Other Ambulatory Visit: Payer: Self-pay

## 2020-05-14 DIAGNOSIS — I63511 Cerebral infarction due to unspecified occlusion or stenosis of right middle cerebral artery: Secondary | ICD-10-CM

## 2020-05-15 ENCOUNTER — Other Ambulatory Visit: Payer: Self-pay

## 2020-05-15 ENCOUNTER — Ambulatory Visit: Payer: BC Managed Care – PPO

## 2020-05-15 DIAGNOSIS — I69354 Hemiplegia and hemiparesis following cerebral infarction affecting left non-dominant side: Secondary | ICD-10-CM | POA: Diagnosis not present

## 2020-05-15 DIAGNOSIS — R2689 Other abnormalities of gait and mobility: Secondary | ICD-10-CM | POA: Diagnosis not present

## 2020-05-15 DIAGNOSIS — M6281 Muscle weakness (generalized): Secondary | ICD-10-CM

## 2020-05-15 DIAGNOSIS — M79602 Pain in left arm: Secondary | ICD-10-CM | POA: Diagnosis not present

## 2020-05-15 DIAGNOSIS — R209 Unspecified disturbances of skin sensation: Secondary | ICD-10-CM | POA: Diagnosis not present

## 2020-05-15 DIAGNOSIS — R2681 Unsteadiness on feet: Secondary | ICD-10-CM | POA: Diagnosis not present

## 2020-05-15 DIAGNOSIS — G8110 Spastic hemiplegia affecting unspecified side: Secondary | ICD-10-CM | POA: Diagnosis not present

## 2020-05-15 NOTE — Therapy (Signed)
[facility] [address] [place], [place], [address] Phone: [phone]   Fax:  [phone]  Physical Therapy Treatment  Patient Details  Name: Derek [name] MRN: [id number] Date of Birth: 06/20/1976 Referring Provider (PT): Faith Rogue, MD   Encounter Date: 05/15/2020   PT End of Session - 05/15/20 1622    Visit Number 3    Number of Visits 17    Date for PT Re-Evaluation 07/30/20   POC for 8 weeks, Cert for 90 days   Authorization Type BCBS    PT Start Time 1620   pt arriving late   PT Stop Time 1659    PT Time Calculation (min) 39 min    Equipment Utilized During Treatment Gait belt    Activity Tolerance Patient tolerated treatment well    Behavior During Therapy [facility] for tasks assessed/performed;Flat affect           Past Medical History:  Diagnosis Date   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/07/2020   Alcohol dependence (HCC)    Back pain    Marijuana dependence (HCC)    Tobacco dependence     Past Surgical History:  Procedure Laterality Date   BUBBLE STUDY  01/10/2020   Procedure: BUBBLE STUDY;  Surgeon: Sande Rives, MD;  Location: [facility] ENDOSCOPY;  Service: Cardiovascular;;   TEE WITHOUT CARDIOVERSION N/A 01/10/2020   Procedure: TRANSESOPHAGEAL ECHOCARDIOGRAM (TEE);  Surgeon: Sande Rives, MD;  Location: [facility] ENDOSCOPY;  Service: Cardiovascular;  Laterality: N/A;    There were no vitals filed for this visit.   Subjective Assessment - 05/15/20 1623    Subjective Patient reports doing well since last visit. No new issues. Reports being doing exercises at home.    Pertinent History Alcohol and Tobacco Use    Limitations Standing;Walking;Sitting    How long can you stand comfortably? 3-5 minutes    Currently in Pain? No/denies    Pain Onset More than a month ago                             [facility] Adult PT Treatment/Exercise - 05/15/20 0001       Transfers   Transfers Sit to Stand;Stand to Sit    Sit to Stand 4: Min guard    Stand to Sit 4: Min guard    Comments completed x 5 reps from mat w/ hemiwalker. CGA intermittently       Ambulation/Gait   Ambulation/Gait Yes    Ambulation/Gait Assistance 4: Min guard;4: Min assist    Ambulation/Gait Assistance Details completed gait training today with hemiwalker, continued to provide verbal cues for improved step length.     Ambulation Distance (Feet) 230 Feet    Assistive device Hemi-walker    Gait Pattern Step-through pattern;Decreased step length - left;Decreased stance time - left;Decreased step length - right;Decreased hip/knee flexion - left;Decreased dorsiflexion - left    Ambulation Surface Level;Indoor      Neuro Re-ed    Neuro Re-ed Details  Seated at edge of mat with LLE on bolster, completed active knee extension with controlled, and focused on improved knee flexion, 1 x 15 reps. PT providing manual faciliation at knee for proper alignment with completion. In supine, with LLE completed diagonal PNF D1 pattern, foucsed on patietnt actively completing flexion/extended LLE with PT manual faciliation as needed x 15 reps.       Exercises   Exercises Other Exercises  Other Exercises  Completed entire review of HEP initiated at last session to ensure proper completion. Patient able to demo all exercises appropriately today.                     PT Short Term Goals - 05/01/20 1948      PT SHORT TERM GOAL #1   Title Patient will be independent with Initial HEP with caregiver assitance (for BLE strengthening/stretching)    Time 4    Period Weeks    Status New    Target Date 05/29/20      PT SHORT TERM GOAL #2   Title Patient will demonstrate ability to complete transfer from w/c <> mat with LRAD, supervision level to demo improved independence with functional mobility    Baseline Min A    Time 4    Period Weeks    Status New    Target Date 05/29/20      PT SHORT  TERM GOAL #3   Title Patient will demonstrate ability to ambulate at least 150 ft w/ LRAD and CGA to demonstrate improved functional mobility    Baseline Min A 75 ft    Time 4    Period Weeks    Status New    Target Date 05/29/20      PT SHORT TERM GOAL #4   Title Patient will improve TUG to </= 45 secs w/ LRAD to demo reduced risk for falls    Time 4    Period Weeks    Status New    Target Date 05/29/20             PT Long Term Goals - 05/01/20 1951      PT LONG TERM GOAL #1   Title Patient will be independent with Final HEP    Time 8    Period Weeks    Status New    Target Date 06/26/20      PT LONG TERM GOAL #2   Title Patient will demonstrate ability to ambulate > 250 ft w/ LRAD and Supv to demo improved community mobility    Time 8    Period Weeks    Status New    Target Date 06/26/20      PT LONG TERM GOAL #3   Title Patient will improve TUG to </= 35 secs w/ LRAD to demo reduced risk for falls    Baseline 58.66 secs w/ hemiwalker    Time 8    Period Weeks    Status New      PT LONG TERM GOAL #4   Title Patient will demo ability to ambulate at gait speed of >/= 2.0 ft/sec to demo improved functional mobility    Time 8    Period Weeks    Status New    Target Date 06/26/20      PT LONG TERM GOAL #5   Title Patient will demo ability to complete all bed mobility and transfers, Mod I, for improved independence    Time 8    Period Weeks    Status New                 Plan - 05/15/20 1841    Clinical Impression Statement Today's skilled session included review of HEP initiated at last visit to ensure proper completion at home. Continued gait training with hemiwalker, with continued focused on imporved endurance. Patient will continue to benefit from skilled PT services to progress toward all goals.  Personal Factors and Comorbidities Comorbidity 2    Comorbidities Chronic Back Pain,HTN, Prior History of Alcohol/Tobacco Use    Examination-Activity  Limitations Bed Mobility;Stairs;Stand;Toileting;Transfers;Dressing    Examination-Participation Restrictions Community Activity;Driving;Yard Work;Other   Occupation   Stability/Clinical Decision Making Evolving/Moderate complexity    Rehab Potential Good    PT Frequency 2x / week    PT Duration 8 weeks    PT Treatment/Interventions ADLs/Self Care Home Management;Electrical Stimulation;Aquatic Therapy;Moist Heat;DME Instruction;Gait training;Stair training;Functional mobility training;Therapeutic activities;Therapeutic exercise;Balance training;Neuromuscular re-education;Patient/family education;Orthotic Fit/Training;Wheelchair mobility training;Manual techniques;Passive range of motion    PT Next Visit Plan Strengthening Exercises for LLE. Gait training. Tall Kneeling, Activites for tone management. Update HEP as needed    Consulted and Agree with Plan of Care Patient           Patient will benefit from skilled therapeutic intervention in order to improve the following deficits and impairments:  Abnormal gait, Decreased balance, Decreased endurance, Decreased mobility, Difficulty walking, Impaired tone, Impaired sensation, Pain, Impaired UE functional use, Impaired flexibility, Decreased strength, Decreased knowledge of use of DME, Decreased activity tolerance, Decreased coordination, Decreased range of motion, Increased muscle spasms  Visit Diagnosis: Hemiplegia and hemiparesis following cerebral infarction affecting left non-dominant side (HCC)  Unsteadiness on feet  Other abnormalities of gait and mobility  Muscle weakness (generalized)     Problem List Patient Active Problem List   Diagnosis Date Noted   Neuropathic pain 04/09/2020   Pain    Spastic hemiparesis (HCC)    History of hypertension    Dyslipidemia    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 [place], [place], [address] Phone: [phone]   Fax:  [phone]  Name: Derek [name] MRN: [id number] Date of Birth: 1976-03-05

## 2020-05-17 MED ORDER — FOLIC ACID 1 MG PO TABS: 1.0000 mg | ORAL_TABLET | Freq: Every day | ORAL | 5 refills | Status: DC

## 2020-05-19 ENCOUNTER — Other Ambulatory Visit: Payer: Self-pay

## 2020-05-19 ENCOUNTER — Encounter: Payer: Self-pay | Admitting: Occupational Therapy

## 2020-05-19 ENCOUNTER — Ambulatory Visit: Payer: BC Managed Care – PPO | Admitting: Occupational Therapy

## 2020-05-19 ENCOUNTER — Ambulatory Visit: Payer: BC Managed Care – PPO

## 2020-05-19 DIAGNOSIS — I69354 Hemiplegia and hemiparesis following cerebral infarction affecting left non-dominant side: Secondary | ICD-10-CM

## 2020-05-19 DIAGNOSIS — R208 Other disturbances of skin sensation: Secondary | ICD-10-CM

## 2020-05-19 DIAGNOSIS — M79602 Pain in left arm: Secondary | ICD-10-CM | POA: Diagnosis not present

## 2020-05-19 DIAGNOSIS — R2681 Unsteadiness on feet: Secondary | ICD-10-CM

## 2020-05-19 DIAGNOSIS — M6281 Muscle weakness (generalized): Secondary | ICD-10-CM | POA: Diagnosis not present

## 2020-05-19 DIAGNOSIS — R2689 Other abnormalities of gait and mobility: Secondary | ICD-10-CM | POA: Diagnosis not present

## 2020-05-19 DIAGNOSIS — R209 Unspecified disturbances of skin sensation: Secondary | ICD-10-CM | POA: Diagnosis not present

## 2020-05-19 DIAGNOSIS — G8110 Spastic hemiplegia affecting unspecified side: Secondary | ICD-10-CM | POA: Diagnosis not present

## 2020-05-19 NOTE — Therapy (Signed)
[facility] [address] [place], [place], [address] Phone: [phone]   Fax:  [phone]  Occupational Therapy Treatment  Patient Details  Name: Derek [name] MRN: [id number] Date of Birth: 08/05/1976 Referring Provider (OT): Dr. Jacklynn Lewis   Encounter Date: 05/19/2020   OT End of Session - 05/19/20 1559    Visit Number 2    Number of Visits 17    Date for OT Re-Evaluation 07/02/20    Authorization Type BC/BS - 60 combined visits for all disciplines    Authorization - Visit Number 2    Authorization - Number of Visits 30    OT Start Time 1400    OT Stop Time 1445    OT Time Calculation (min) 45 min    Activity Tolerance Patient tolerated treatment well    Behavior During Therapy [facility] for tasks assessed/performed           Past Medical History:  Diagnosis Date  . 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 Alcohol dependence (HCC)   . Back pain   . Marijuana dependence (HCC)   . Tobacco dependence     Past Surgical History:  Procedure Laterality Date  . BUBBLE STUDY  01/10/2020   Procedure: BUBBLE STUDY;  Surgeon: Sande Rives, MD;  Location: [facility] ENDOSCOPY;  Service: Cardiovascular;;  . TEE WITHOUT CARDIOVERSION N/A 01/10/2020   Procedure: TRANSESOPHAGEAL ECHOCARDIOGRAM (TEE);  Surgeon: Sande Rives, MD;  Location: [facility] ENDOSCOPY;  Service: Cardiovascular;  Laterality: N/A;    There were no vitals filed for this visit.   Subjective Assessment - 05/19/20 1407    Subjective  My shoulder doesn't hurt now.    Patient is accompanied by: Family member    Pertinent History CVA 01/07/20 w/ residual Lt hemiparesis. PMH: HTN, ETOH abuse, smoker    Pain Score 0-No pain                        OT Treatments/Exercises (OP) - 05/19/20 0001      ADLs   ADL Comments Reviewed short term goals - patient in agreement      Neurological Re-education Exercises   Other Exercises  1 Neuromuscular reeducation to address muscle imblance in left shoulder and arm.  Patient very fearful of pain and fearful of weight shifting toward left side in sitting, standing, and even rolling.  This leads to increased tension in shoulder and throughout LE, and then ultimately pain.  Patient did well with slow, calm approach to allow gentle body on arm movement through rolling toward and away from left side.  Patient tolerating nearly full elbow extension and approximately 75 degrees of shoulder flexion without significant pain.  Able to facilitate elbow flexion (volitional) and wrist extension.                    OT Education - 05/19/20 1559    Education Details body on arm motion supine    Person(s) Educated Patient    Methods Explanation;Demonstration    Comprehension Need further instruction            OT Short Term Goals - 05/19/20 1408      OT SHORT TERM GOAL #1   Title Independent with HEP for LUE    Status On-going      OT SHORT TERM GOAL #2   Title Pt to be independent with splint wear and care prn    Status On-going  OT SHORT TERM GOAL #3   Title Pt to verbalize understanding with pain management strategies and proper positioning of LUE    Status On-going      OT SHORT TERM GOAL #4   Title Pt to bathe self with min assist seated    Status On-going      OT SHORT TERM GOAL #5   Title Pt to perform UE dressing with min assist and LE dressing with mod assist    Status On-going      OT SHORT TERM GOAL #6   Title Pt to tolerate passive shoulder flexion to 90* supine w/ pain 5/10 or under    Status On-going             OT Long Term Goals - 05/01/20 1728      OT LONG TERM GOAL #1   Title Pt to tolerate 75% PROM LUE with pain 3/10 or under    Time 8    Period Weeks    Status New      OT LONG TERM GOAL #2   Title Pt to use LUE as stabalizer for bilateral tasks    Time 8    Period Weeks    Status New      OT LONG TERM GOAL #3   Title Pt to  verbalize understanding with potential A/E needs and task modifications to increase ease with ADLS and simple IADLS    Time 8    Period Weeks    Status New      OT LONG TERM GOAL #4   Title Pt to be mod I level with bathing and UE dressing and min assist for LE dressing    Time 8    Period Weeks    Status New      OT LONG TERM GOAL #5   Title Pt to perform toileting at mod I level    Time 8    Period Weeks    Status New      Long Term Additional Goals   Additional Long Term Goals Yes      OT LONG TERM GOAL #6   Title Pt to perform simple snack prep/sandwich prep from w/c level prn or w/ use of walker and walker tray    Time 8    Period Weeks    Status New                 Plan - 05/19/20 1600    Clinical Impression Statement Patient with very tight and painful left Upper Extremity, seems to respond well to gentle slow motion.    OT Frequency 2x / week    OT Duration 8 weeks    OT Treatment/Interventions Self-care/ADL training;Therapeutic exercise;Functional Mobility Training;Aquatic Therapy;Neuromuscular education;Manual Therapy;Splinting;Therapeutic activities;Coping strategies training;DME and/or AE instruction;Cognitive remediation/compensation;Electrical Stimulation;Visual/perceptual remediation/compensation;Moist Heat;Passive range of motion;Patient/family education    Plan continue body on arm motion, needs weight shifting to left in sitting and standing    OT Home Exercise Plan Initiated supine body on arm with partial rolling - girlfriend not present    Consulted and Agree with Plan of Care Patient           Patient will benefit from skilled therapeutic intervention in order to improve the following deficits and impairments:           Visit Diagnosis: Hemiplegia and hemiparesis following cerebral infarction affecting left non-dominant side (HCC)  Unsteadiness on feet  Muscle weakness (generalized)  Pain in left arm  Other  disturbances of skin  sensation  Spastic hemiplegia of left nondominant 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 [facility])    Problem List Patient Active Problem List   Diagnosis Date Noted  . Neuropathic pain 04/09/2020  . Pain   . Spastic hemiparesis (HCC)   . History of hypertension   . Dyslipidemia   . 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 Leukocytosis 01/12/2020  . Cerebrovascular accident (CVA) due to thrombosis of right middle cerebral artery (HCC)   . Chronic pain syndrome   . Hypokalemia   . Dysphagia, post-stroke   . 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 Tobacco dependence   . Marijuana dependence (HCC)   . Alcohol dependence (HCC)     Collier Salina, OTR/L 05/19/2020, 4:03 PM  [facility] [address] [place], [place], [address] Phone: [phone]   Fax:  [phone]  Name: Derek [name] MRN: [id number] Date of Birth: 07-17-1976

## 2020-05-19 NOTE — Therapy (Signed)
[facility] [address] [place], [place], [address] Phone: [phone]   Fax:  [phone]  Physical Therapy Treatment  Patient Details  Name: Derek [name] MRN: [id number] Date of Birth: 14-May-1976 Referring Provider (PT): Faith Rogue, MD   Encounter Date: 05/19/2020   PT End of Session - 05/19/20 1621    Visit Number 4    Number of Visits 17    Date for PT Re-Evaluation 07/30/20   POC for 8 weeks, Cert for 90 days   Authorization Type BCBS    PT Start Time 1449    PT Stop Time 1530    PT Time Calculation (min) 41 min    Equipment Utilized During Treatment Gait belt    Activity Tolerance Patient tolerated treatment well    Behavior During Therapy [facility] for tasks assessed/performed;Flat affect           Past Medical History:  Diagnosis Date  . 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 Alcohol dependence (HCC)   . Back pain   . Marijuana dependence (HCC)   . Tobacco dependence     Past Surgical History:  Procedure Laterality Date  . BUBBLE STUDY  01/10/2020   Procedure: BUBBLE STUDY;  Surgeon: Sande Rives, MD;  Location: [facility] ENDOSCOPY;  Service: Cardiovascular;;  . TEE WITHOUT CARDIOVERSION N/A 01/10/2020   Procedure: TRANSESOPHAGEAL ECHOCARDIOGRAM (TEE);  Surgeon: Sande Rives, MD;  Location: [facility] ENDOSCOPY;  Service: Cardiovascular;  Laterality: N/A;    There were no vitals filed for this visit.   Subjective Assessment - 05/19/20 1453    Subjective Patient reports no new complaints/concerns since last visit. Slight pain in the shoulder. Patient reports continues to complete HEP.    Pertinent History Alcohol and Tobacco Use    Limitations Standing;Walking;Sitting    How long can you stand comfortably? 3-5 minutes    Currently in Pain? Yes    Pain Score 3     Pain Location Shoulder    Pain Orientation Left    Pain Descriptors / Indicators Aching    Pain Onset More than a  month ago                             [facility] Adult PT Treatment/Exercise - 05/19/20 0001      Transfers   Transfers Sit to Stand;Stand to Sit    Sit to Stand 4: Min guard;5: Supervision    Stand to Sit 4: Min guard;5: Supervision    Comments completed x 10 reps from mat with hemiwalker      Ambulation/Gait   Ambulation/Gait Yes    Ambulation/Gait Assistance 4: Min guard    Ambulation/Gait Assistance Details completed gait training with hemiwalker, PT providing verbal cues for improved step length and proper use of hemiwalker with turns    Ambulation Distance (Feet) 250 Feet   x 1 100 x 1   Assistive device Hemi-walker    Gait Pattern Step-through pattern;Decreased step length - left;Decreased stance time - left;Decreased step length - right;Decreased hip/knee flexion - left;Decreased dorsiflexion - left    Ambulation Surface Level;Indoor      Self-Care   Self-Care Other Self-Care Comments    Other Self-Care Comments  Due to patient reports of soreness on L foot, PT further assessed area. Patient does demo some tenderness to palpation along 5th metatarsal, however no bruising/redness noted. PT educating to continue to reassess at home.  Neuro Re-ed    Neuro Re-ed Details  completed diagonal PNF D1 with LLE in supine, with focus on improved flexion/extension of LLE x 5 minutes, intermittent rest breaks as needed.       Exercises   Exercises Other Exercises    Other Exercises  Completed entire review of HEP initiated at last session to ensure proper completion. Patient able to demo all exercises appropriately today. PT completed manual stretching to LLE gastroc, 2 x 30 seconds and LLE hamstrings 2 x 30 seconds as tolerated by patient.                     PT Short Term Goals - 05/01/20 1948      PT SHORT TERM GOAL #1   Title Patient will be independent with Initial HEP with caregiver assitance (for BLE strengthening/stretching)    Time 4    Period  Weeks    Status New    Target Date 05/29/20      PT SHORT TERM GOAL #2   Title Patient will demonstrate ability to complete transfer from w/c <> mat with LRAD, supervision level to demo improved independence with functional mobility    Baseline Min A    Time 4    Period Weeks    Status New    Target Date 05/29/20      PT SHORT TERM GOAL #3   Title Patient will demonstrate ability to ambulate at least 150 ft w/ LRAD and CGA to demonstrate improved functional mobility    Baseline Min A 75 ft    Time 4    Period Weeks    Status New    Target Date 05/29/20      PT SHORT TERM GOAL #4   Title Patient will improve TUG to </= 45 secs w/ LRAD to demo reduced risk for falls    Time 4    Period Weeks    Status New    Target Date 05/29/20             PT Long Term Goals - 05/01/20 1951      PT LONG TERM GOAL #1   Title Patient will be independent with Final HEP    Time 8    Period Weeks    Status New    Target Date 06/26/20      PT LONG TERM GOAL #2   Title Patient will demonstrate ability to ambulate > 250 ft w/ LRAD and Supv to demo improved community mobility    Time 8    Period Weeks    Status New    Target Date 06/26/20      PT LONG TERM GOAL #3   Title Patient will improve TUG to </= 35 secs w/ LRAD to demo reduced risk for falls    Baseline 58.66 secs w/ hemiwalker    Time 8    Period Weeks    Status New      PT LONG TERM GOAL #4   Title Patient will demo ability to ambulate at gait speed of >/= 2.0 ft/sec to demo improved functional mobility    Time 8    Period Weeks    Status New    Target Date 06/26/20      PT LONG TERM GOAL #5   Title Patient will demo ability to complete all bed mobility and transfers, Mod I, for improved independence    Time 8    Period Weeks    Status  New                 Plan - 05/19/20 1622    Clinical Impression Statement Today's skilled session focused on NMR and activites to promote improved hip/knee flexion and  reduction of tone. Patient tolerating all exercises well, and requiring intermittent Min A for improved postioning and completion of certain activites. Patient will continue to benefit from skilled PT services to progress toward goals and improve functional mobility.    Personal Factors and Comorbidities Comorbidity 2    Comorbidities Chronic Back Pain,HTN, Prior History of Alcohol/Tobacco Use    Examination-Activity Limitations Bed Mobility;Stairs;Stand;Toileting;Transfers;Dressing    Examination-Participation Restrictions Community Activity;Driving;Yard Work;Other   Occupation   Stability/Clinical Decision Making Evolving/Moderate complexity    Rehab Potential Good    PT Frequency 2x / week    PT Duration 8 weeks    PT Treatment/Interventions ADLs/Self Care Home Management;Electrical Stimulation;Aquatic Therapy;Moist Heat;DME Instruction;Gait training;Stair training;Functional mobility training;Therapeutic activities;Therapeutic exercise;Balance training;Neuromuscular re-education;Patient/family education;Orthotic Fit/Training;Wheelchair mobility training;Manual techniques;Passive range of motion    PT Next Visit Plan Strengthening Exercises for LLE. Gait training. Tall Kneeling, Activites for tone management. Update HEP as needed    Consulted and Agree with Plan of Care Patient           Patient will benefit from skilled therapeutic intervention in order to improve the following deficits and impairments:  Abnormal gait, Decreased balance, Decreased endurance, Decreased mobility, Difficulty walking, Impaired tone, Impaired sensation, Pain, Impaired UE functional use, Impaired flexibility, Decreased strength, Decreased knowledge of use of DME, Decreased activity tolerance, Decreased coordination, Decreased range of motion, Increased muscle spasms  Visit Diagnosis: Hemiplegia and hemiparesis following cerebral infarction affecting left non-dominant side (HCC)  Unsteadiness on feet  Muscle  weakness (generalized)     Problem List Patient Active Problem List   Diagnosis Date Noted  . Neuropathic pain 04/09/2020  . Pain   . Spastic hemiparesis (HCC)   . History of hypertension   . Dyslipidemia   . 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 Leukocytosis 01/12/2020  . Cerebrovascular accident (CVA) due to thrombosis of right middle cerebral artery (HCC)   . Chronic pain syndrome   . Hypokalemia   . Dysphagia, post-stroke   . 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 Tobacco dependence   . Marijuana dependence (HCC)   . Alcohol dependence (HCC)     Tempie Donning, PT, DPT 05/19/2020, 4:25 PM  [facility] [address] [place], [place], [address] Phone: [phone]   Fax:  [phone]  Name: DEREL MCGLASSON MRN: [id number] Date of Birth: 05-16-1976

## 2020-05-19 NOTE — Patient Instructions (Signed)
Lie on you back, allow your shoulder blades to sink downward toward bed, have left arm supported on pillow at 45 degrees from your body.   Bend your knees and gently rock back and forth from right to left, increasing the motion with each repetition.  You can turn your head in the same direction as your knees.   Complete every time you are in bed.   Spend 5 min each time.

## 2020-05-22 ENCOUNTER — Ambulatory Visit: Payer: BC Managed Care – PPO

## 2020-05-22 ENCOUNTER — Other Ambulatory Visit: Payer: Self-pay

## 2020-05-22 ENCOUNTER — Encounter: Payer: Self-pay | Admitting: Occupational Therapy

## 2020-05-22 ENCOUNTER — Ambulatory Visit: Payer: BC Managed Care – PPO | Admitting: Occupational Therapy

## 2020-05-22 DIAGNOSIS — I69354 Hemiplegia and hemiparesis following cerebral infarction affecting left non-dominant side: Secondary | ICD-10-CM

## 2020-05-22 DIAGNOSIS — R2681 Unsteadiness on feet: Secondary | ICD-10-CM

## 2020-05-22 DIAGNOSIS — G8110 Spastic hemiplegia affecting unspecified side: Secondary | ICD-10-CM | POA: Diagnosis not present

## 2020-05-22 DIAGNOSIS — R2689 Other abnormalities of gait and mobility: Secondary | ICD-10-CM

## 2020-05-22 DIAGNOSIS — M6281 Muscle weakness (generalized): Secondary | ICD-10-CM

## 2020-05-22 DIAGNOSIS — R209 Unspecified disturbances of skin sensation: Secondary | ICD-10-CM | POA: Diagnosis not present

## 2020-05-22 DIAGNOSIS — R208 Other disturbances of skin sensation: Secondary | ICD-10-CM

## 2020-05-22 DIAGNOSIS — M79602 Pain in left arm: Secondary | ICD-10-CM | POA: Diagnosis not present

## 2020-05-22 NOTE — Therapy (Signed)
[facility] [address] [place], [place], [address] Phone: [phone]   Fax:  [phone]  Physical Therapy Treatment  Patient Details  Name: Derek [name] MRN: [id number] Date of Birth: 1976-08-04 Referring Provider (PT): Faith Rogue, MD   Encounter Date: 05/22/2020   PT End of Session - 05/22/20 1624    Visit Number 5    Number of Visits 17    Date for PT Re-Evaluation 07/30/20   POC for 8 weeks, Cert for 90 days   Authorization Type BCBS    PT Start Time 1619   pt arriving few minutes late   PT Stop Time 1659    PT Time Calculation (min) 40 min    Equipment Utilized During Treatment Gait belt    Activity Tolerance Patient tolerated treatment well    Behavior During Therapy [facility] for tasks assessed/performed;Flat affect           Past Medical History:  Diagnosis Date  . 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 Alcohol dependence (HCC)   . Back pain   . Marijuana dependence (HCC)   . Tobacco dependence     Past Surgical History:  Procedure Laterality Date  . BUBBLE STUDY  01/10/2020   Procedure: BUBBLE STUDY;  Surgeon: Sande Rives, MD;  Location: [facility] ENDOSCOPY;  Service: Cardiovascular;;  . TEE WITHOUT CARDIOVERSION N/A 01/10/2020   Procedure: TRANSESOPHAGEAL ECHOCARDIOGRAM (TEE);  Surgeon: Sande Rives, MD;  Location: [facility] ENDOSCOPY;  Service: Cardiovascular;  Laterality: N/A;    There were no vitals filed for this visit.   Subjective Assessment - 05/22/20 1623    Subjective Patient reports mild pain in the shoulder. No new complaints since last visit.    Pertinent History Alcohol and Tobacco Use    Limitations Standing;Walking;Sitting    How long can you stand comfortably? 3-5 minutes    Currently in Pain? Yes    Pain Score 3     Pain Location Shoulder    Pain Orientation Left    Pain Descriptors / Indicators Aching    Pain Type Acute pain    Pain Onset More than a  month ago                             [facility] Adult PT Treatment/Exercise - 05/22/20 0001      Transfers   Transfers Sit to Stand;Stand to Sit    Sit to Stand 4: Min guard    Stand to Sit 4: Min guard    Comments completed 1 x 10 reps with 2" step placed under RLE to promote improved weight shift and stregnthening of LLE. PT providing manual faciliation for improved weight shift. Mirror placed in front for visual feedback regarding posture with standing.       Ambulation/Gait   Ambulation/Gait Yes    Ambulation/Gait Assistance 4: Min guard    Ambulation/Gait Assistance Details completed gait trianing initially with hemiwalker, continued focused on improved gait distance as tolerated by patient. Completed gait training x 330 ft w/o AD, with PT providing min A on R side, and CGA on L side from OT. Focused on improved step length and weight shift to L side with ambulation, as well as improved relaxation of LUE and focus on arm swing with ambulation.     Ambulation Distance (Feet) 235 Feet   330 x 1   Assistive device Hemi-walker    Gait Pattern  Step-through pattern;Decreased step length - left;Decreased stance time - left;Decreased step length - right;Decreased hip/knee flexion - left;Decreased dorsiflexion - left    Ambulation Surface Level;Indoor      Neuro Re-ed    Neuro Re-ed Details  In standing: completed forward/posterior steps with RLE to promote weight acceptance and weight shift onto LLE. Initially completed with RUE on hemiwalker, progressed to completing without UE support and PT providing manual faciliation at pelvis for improved weight shift and alignment. Completed 1 x 15 reps.                     PT Short Term Goals - 05/01/20 1948      PT SHORT TERM GOAL #1   Title Patient will be independent with Initial HEP with caregiver assitance (for BLE strengthening/stretching)    Time 4    Period Weeks    Status New    Target Date 05/29/20      PT  SHORT TERM GOAL #2   Title Patient will demonstrate ability to complete transfer from w/c <> mat with LRAD, supervision level to demo improved independence with functional mobility    Baseline Min A    Time 4    Period Weeks    Status New    Target Date 05/29/20      PT SHORT TERM GOAL #3   Title Patient will demonstrate ability to ambulate at least 150 ft w/ LRAD and CGA to demonstrate improved functional mobility    Baseline Min A 75 ft    Time 4    Period Weeks    Status New    Target Date 05/29/20      PT SHORT TERM GOAL #4   Title Patient will improve TUG to </= 45 secs w/ LRAD to demo reduced risk for falls    Time 4    Period Weeks    Status New    Target Date 05/29/20             PT Long Term Goals - 05/01/20 1951      PT LONG TERM GOAL #1   Title Patient will be independent with Final HEP    Time 8    Period Weeks    Status New    Target Date 06/26/20      PT LONG TERM GOAL #2   Title Patient will demonstrate ability to ambulate > 250 ft w/ LRAD and Supv to demo improved community mobility    Time 8    Period Weeks    Status New    Target Date 06/26/20      PT LONG TERM GOAL #3   Title Patient will improve TUG to </= 35 secs w/ LRAD to demo reduced risk for falls    Baseline 58.66 secs w/ hemiwalker    Time 8    Period Weeks    Status New      PT LONG TERM GOAL #4   Title Patient will demo ability to ambulate at gait speed of >/= 2.0 ft/sec to demo improved functional mobility    Time 8    Period Weeks    Status New    Target Date 06/26/20      PT LONG TERM GOAL #5   Title Patient will demo ability to complete all bed mobility and transfers, Mod I, for improved independence    Time 8    Period Weeks    Status New  Plan - 05/22/20 1921    Clinical Impression Statement Today's session focused on extensive gait training with and without hemiwalker. Patient able to ambulate without AD, and Min A from PT and CGA from OT.  Gait trianing focused on improved weight shift and step length. Continued activites focused on weight acceptance and weight shift to LLE throughout session. Patient will continue to benefit from skilled PT services.    Personal Factors and Comorbidities Comorbidity 2    Comorbidities Chronic Back Pain,HTN, Prior History of Alcohol/Tobacco Use    Examination-Activity Limitations Bed Mobility;Stairs;Stand;Toileting;Transfers;Dressing    Examination-Participation Restrictions Community Activity;Driving;Yard Work;Other   Occupation   Stability/Clinical Decision Making Evolving/Moderate complexity    Rehab Potential Good    PT Frequency 2x / week    PT Duration 8 weeks    PT Treatment/Interventions ADLs/Self Care Home Management;Electrical Stimulation;Aquatic Therapy;Moist Heat;DME Instruction;Gait training;Stair training;Functional mobility training;Therapeutic activities;Therapeutic exercise;Balance training;Neuromuscular re-education;Patient/family education;Orthotic Fit/Training;Wheelchair mobility training;Manual techniques;Passive range of motion    PT Next Visit Plan Strengthening Exercises for LLE. Gait training. Tall Kneeling, Activites for tone management. Update HEP as needed    Consulted and Agree with Plan of Care Patient           Patient will benefit from skilled therapeutic intervention in order to improve the following deficits and impairments:  Abnormal gait, Decreased balance, Decreased endurance, Decreased mobility, Difficulty walking, Impaired tone, Impaired sensation, Pain, Impaired UE functional use, Impaired flexibility, Decreased strength, Decreased knowledge of use of DME, Decreased activity tolerance, Decreased coordination, Decreased range of motion, Increased muscle spasms  Visit Diagnosis: Hemiplegia and hemiparesis following cerebral infarction affecting left non-dominant side (HCC)  Unsteadiness on feet  Muscle weakness (generalized)  Other abnormalities of gait  and mobility     Problem List Patient Active Problem List   Diagnosis Date Noted  . Neuropathic pain 04/09/2020  . Pain   . Spastic hemiparesis (HCC)   . History of hypertension   . Dyslipidemia   . 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 Leukocytosis 01/12/2020  . Cerebrovascular accident (CVA) due to thrombosis of right middle cerebral artery (HCC)   . Chronic pain syndrome   . Hypokalemia   . Dysphagia, post-stroke   . 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 Tobacco dependence   . Marijuana dependence (HCC)   . Alcohol dependence (HCC)     Tempie Donning, PT, DPT 05/22/2020, 7:23 PM  [facility] [address] [place], [place], [address] Phone: [phone]   Fax:  [phone]  Name: HERNANDO REALI MRN: [id number] Date of Birth: 1975-11-11

## 2020-05-22 NOTE — Therapy (Signed)
[facility] [address] [place], [place], [address] Phone: [phone]   Fax:  [phone]  Occupational Therapy Treatment  Patient Details  Name: Derek [name] MRN: [id number] Date of Birth: 1976/02/26 Referring Provider (OT): Dr. Jacklynn Lewis   Encounter Date: 05/22/2020   OT End of Session - 05/22/20 1842    Visit Number 3    Number of Visits 17    Date for OT Re-Evaluation 07/02/20    Authorization Type BC/BS - 60 combined visits for all disciplines    Authorization - Visit Number 3    Authorization - Number of Visits 30    OT Start Time 1700    OT Stop Time 1745    OT Time Calculation (min) 45 min    Activity Tolerance Patient tolerated treatment well    Behavior During Therapy [facility] for tasks assessed/performed;Flat affect           Past Medical History:  Diagnosis Date  . 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 Alcohol dependence (HCC)   . Back pain   . Marijuana dependence (HCC)   . Tobacco dependence     Past Surgical History:  Procedure Laterality Date  . BUBBLE STUDY  01/10/2020   Procedure: BUBBLE STUDY;  Surgeon: Sande Rives, MD;  Location: [facility] ENDOSCOPY;  Service: Cardiovascular;;  . TEE WITHOUT CARDIOVERSION N/A 01/10/2020   Procedure: TRANSESOPHAGEAL ECHOCARDIOGRAM (TEE);  Surgeon: Sande Rives, MD;  Location: [facility] ENDOSCOPY;  Service: Cardiovascular;  Laterality: N/A;    There were no vitals filed for this visit.   Subjective Assessment - 05/22/20 1701    Subjective  Patient indicates that his pian was a 2-3 when he started PT session - currently 0/10 left shoulder    Currently in Pain? No/denies    Pain Score 0-No pain                        OT Treatments/Exercises (OP) - 05/22/20 1829      Neurological Re-education Exercises   Other Exercises 1 Neuromuscular reeducation to address body on arm movement to address shoulder rnage of motion  and discomfort.  Patient remains very guarded in left shoulder, but with gentle motion and proper alignment, pain resolved quickly.  In sidelying workd on visually attending to left hand, and isolating pro/supination., elbow flex/ext, wrist flex ext.  Patient able to uses a lateral pinch to hold 1 inch block, maintain that hold, and bend and straighten elbow, or rotate forearm to release or grasp.  Patient very excited at isolated, controlled movement, although responses fairly delayed.  Repetition helpful for improving speed of response.      Other Exercises 2 In sitting on mobile surface (UE Ranger) with facilitation for improved upright sitting and postural control, began to work on arm on body movement in very small gentle ranges.  In standing worked on weight shifting toward left for sit to stand and to stand step teansfer to chair without hemi walker.                      OT Short Term Goals - 05/19/20 1408      OT SHORT TERM GOAL #1   Title Independent with HEP for LUE    Status On-going      OT SHORT TERM GOAL #2   Title Pt to be independent with splint wear and care prn    Status On-going  OT SHORT TERM GOAL #3   Title Pt to verbalize understanding with pain management strategies and proper positioning of LUE    Status On-going      OT SHORT TERM GOAL #4   Title Pt to bathe self with min assist seated    Status On-going      OT SHORT TERM GOAL #5   Title Pt to perform UE dressing with min assist and LE dressing with mod assist    Status On-going      OT SHORT TERM GOAL #6   Title Pt to tolerate passive shoulder flexion to 90* supine w/ pain 5/10 or under    Status On-going             OT Long Term Goals - 05/01/20 1728      OT LONG TERM GOAL #1   Title Pt to tolerate 75% PROM LUE with pain 3/10 or under    Time 8    Period Weeks    Status New      OT LONG TERM GOAL #2   Title Pt to use LUE as stabalizer for bilateral tasks    Time 8    Period  Weeks    Status New      OT LONG TERM GOAL #3   Title Pt to verbalize understanding with potential A/E needs and task modifications to increase ease with ADLS and simple IADLS    Time 8    Period Weeks    Status New      OT LONG TERM GOAL #4   Title Pt to be mod I level with bathing and UE dressing and min assist for LE dressing    Time 8    Period Weeks    Status New      OT LONG TERM GOAL #5   Title Pt to perform toileting at mod I level    Time 8    Period Weeks    Status New      Long Term Additional Goals   Additional Long Term Goals Yes      OT LONG TERM GOAL #6   Title Pt to perform simple snack prep/sandwich prep from w/c level prn or w/ use of walker and walker tray    Time 8    Period Weeks    Status New                 Plan - 05/22/20 1842    Clinical Impression Statement Patient with more tolerance of passive movement in LUE, and is beginning to demonstrate some isolated movement in Left arm.    OT Frequency 2x / week    OT Duration 8 weeks    OT Treatment/Interventions Self-care/ADL training;Therapeutic exercise;Functional Mobility Training;Aquatic Therapy;Neuromuscular education;Manual Therapy;Splinting;Therapeutic activities;Coping strategies training;DME and/or AE instruction;Cognitive remediation/compensation;Electrical Stimulation;Visual/perceptual remediation/compensation;Moist Heat;Passive range of motion;Patient/family education    Plan continue body on arm motion, needs weight shifting to left in sitting and standing    OT Home Exercise Plan Initiated supine body on arm with partial rolling - girlfriend not present    Consulted and Agree with Plan of Care Patient           Patient will benefit from skilled therapeutic intervention in order to improve the following deficits and impairments:           Visit Diagnosis: Hemiplegia and hemiparesis following cerebral infarction affecting left non-dominant side (HCC)  Unsteadiness on  feet  Muscle weakness (generalized)  Pain in  left arm  Other disturbances of skin sensation  Spastic hemiplegia of left nondominant 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 [facility])    Problem List Patient Active Problem List   Diagnosis Date Noted  . Neuropathic pain 04/09/2020  . Pain   . Spastic hemiparesis (HCC)   . History of hypertension   . Dyslipidemia   . 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 Leukocytosis 01/12/2020  . Cerebrovascular accident (CVA) due to thrombosis of right middle cerebral artery (HCC)   . Chronic pain syndrome   . Hypokalemia   . Dysphagia, post-stroke   . 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 Tobacco dependence   . Marijuana dependence (HCC)   . Alcohol dependence [facility])     Collier Salina, OTR/L 05/22/2020, 6:45 PM  [facility] [address] [place], [place], [address] Phone: [phone]   Fax:  [phone]  Name: ALEXANDRO LINE MRN: [id number] Date of Birth: 10-19-1976

## 2020-05-26 ENCOUNTER — Ambulatory Visit: Payer: BC Managed Care – PPO | Admitting: Physical Therapy

## 2020-05-26 ENCOUNTER — Ambulatory Visit: Payer: BC Managed Care – PPO | Admitting: Occupational Therapy

## 2020-05-26 ENCOUNTER — Other Ambulatory Visit: Payer: Self-pay

## 2020-05-26 ENCOUNTER — Encounter: Payer: Self-pay | Admitting: Physical Therapy

## 2020-05-26 DIAGNOSIS — R2689 Other abnormalities of gait and mobility: Secondary | ICD-10-CM | POA: Diagnosis not present

## 2020-05-26 DIAGNOSIS — R2681 Unsteadiness on feet: Secondary | ICD-10-CM

## 2020-05-26 DIAGNOSIS — R208 Other disturbances of skin sensation: Secondary | ICD-10-CM

## 2020-05-26 DIAGNOSIS — I69354 Hemiplegia and hemiparesis following cerebral infarction affecting left non-dominant side: Secondary | ICD-10-CM

## 2020-05-26 DIAGNOSIS — M79602 Pain in left arm: Secondary | ICD-10-CM | POA: Diagnosis not present

## 2020-05-26 DIAGNOSIS — M6281 Muscle weakness (generalized): Secondary | ICD-10-CM

## 2020-05-26 DIAGNOSIS — R209 Unspecified disturbances of skin sensation: Secondary | ICD-10-CM | POA: Diagnosis not present

## 2020-05-26 DIAGNOSIS — G8110 Spastic hemiplegia affecting unspecified side: Secondary | ICD-10-CM | POA: Diagnosis not present

## 2020-05-26 NOTE — Therapy (Signed)
[facility] [address] [place], [place], [address] Phone: [phone]   Fax:  [phone]  Occupational Therapy Treatment  Patient Details  Name: Derek [name] MRN: [id number] Date of Birth: May 16, 1976 Referring Provider (OT): Dr. Jacklynn Lewis   Encounter Date: 05/26/2020   OT End of Session - 05/26/20 1416    Visit Number 4    Number of Visits 17    Date for OT Re-Evaluation 07/02/20    Authorization Type BC/BS - 60 combined visits for all disciplines    Authorization - Visit Number 4    Authorization - Number of Visits 30    OT Start Time 1320    OT Stop Time 1400    OT Time Calculation (min) 40 min    Activity Tolerance Patient tolerated treatment well    Behavior During Therapy [facility] for tasks assessed/performed;Flat affect           Past Medical History:  Diagnosis Date  . 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 Alcohol dependence (HCC)   . Back pain   . Marijuana dependence (HCC)   . Tobacco dependence     Past Surgical History:  Procedure Laterality Date  . BUBBLE STUDY  01/10/2020   Procedure: BUBBLE STUDY;  Surgeon: Sande Rives, MD;  Location: [facility] ENDOSCOPY;  Service: Cardiovascular;;  . TEE WITHOUT CARDIOVERSION N/A 01/10/2020   Procedure: TRANSESOPHAGEAL ECHOCARDIOGRAM (TEE);  Surgeon: Sande Rives, MD;  Location: [facility] ENDOSCOPY;  Service: Cardiovascular;  Laterality: N/A;    There were no vitals filed for this visit.   Subjective Assessment - 05/26/20 1327    Pertinent History CVA 01/07/20 w/ residual Lt hemiparesis. PMH: HTN, ETOH abuse, smoker    Limitations fall risk    Currently in Pain? Yes    Pain Score 4     Pain Location Shoulder    Pain Orientation Left    Pain Descriptors / Indicators Aching    Pain Type Acute pain    Pain Onset More than a month ago    Pain Frequency Intermittent    Aggravating Factors  certain positions    Pain Relieving Factors  medications            Seated: body on arm movements with lateral trunk flexion bilaterally, then just Lt side w/ reaching RUE, followed by trunk rotation ex's w/ body on arm. Pt able to tolerate more passive sh flexion w/ trunk rotation and into gravity. Seated to supine w/ min assist and pillow placed under LUE (while waiting for P.T. session)                       OT Short Term Goals - 05/19/20 1408      OT SHORT TERM GOAL #1   Title Independent with HEP for LUE    Status On-going      OT SHORT TERM GOAL #2   Title Pt to be independent with splint wear and care prn    Status On-going      OT SHORT TERM GOAL #3   Title Pt to verbalize understanding with pain management strategies and proper positioning of LUE    Status On-going      OT SHORT TERM GOAL #4   Title Pt to bathe self with min assist seated    Status On-going      OT SHORT TERM GOAL #5   Title Pt to perform UE dressing with min  assist and LE dressing with mod assist    Status On-going      OT SHORT TERM GOAL #6   Title Pt to tolerate passive shoulder flexion to 90* supine w/ pain 5/10 or under    Status On-going             OT Long Term Goals - 05/01/20 1728      OT LONG TERM GOAL #1   Title Pt to tolerate 75% PROM LUE with pain 3/10 or under    Time 8    Period Weeks    Status New      OT LONG TERM GOAL #2   Title Pt to use LUE as stabalizer for bilateral tasks    Time 8    Period Weeks    Status New      OT LONG TERM GOAL #3   Title Pt to verbalize understanding with potential A/E needs and task modifications to increase ease with ADLS and simple IADLS    Time 8    Period Weeks    Status New      OT LONG TERM GOAL #4   Title Pt to be mod I level with bathing and UE dressing and min assist for LE dressing    Time 8    Period Weeks    Status New      OT LONG TERM GOAL #5   Title Pt to perform toileting at mod I level    Time 8    Period Weeks    Status New       Long Term Additional Goals   Additional Long Term Goals Yes      OT LONG TERM GOAL #6   Title Pt to perform simple snack prep/sandwich prep from w/c level prn or w/ use of walker and walker tray    Time 8    Period Weeks    Status New                 Plan - 05/26/20 1417    Clinical Impression Statement Patient with more tolerance of passive movement in LUE, and is beginning to demonstrate some isolated movement in Left arm.    Occupational performance deficits (Please refer to evaluation for details): ADL's;IADL's;Rest and Sleep;Leisure;Social Participation    Body Structure / Function / Physical Skills ADL;ROM;Dexterity;Edema;Balance;IADL;Body mechanics;Improper spinal/pelvic alignment;Sensation;Mobility;Strength;Muscle spasms;Coordination;Tone;Pain;UE functional use;Decreased knowledge of use of DME;Vision    Rehab Potential Fair   severity of deficits, pain   OT Frequency 2x / week    OT Duration 8 weeks    OT Treatment/Interventions Self-care/ADL training;Therapeutic exercise;Functional Mobility Training;Aquatic Therapy;Neuromuscular education;Manual Therapy;Splinting;Therapeutic activities;Coping strategies training;DME and/or AE instruction;Cognitive remediation/compensation;Electrical Stimulation;Visual/perceptual remediation/compensation;Moist Heat;Passive range of motion;Patient/family education    Plan continue body on arm motion, needs weight shifting to left in sitting and standing    Consulted and Agree with Plan of Care Patient           Patient will benefit from skilled therapeutic intervention in order to improve the following deficits and impairments:   Body Structure / Function / Physical Skills: ADL, ROM, Dexterity, Edema, Balance, IADL, Body mechanics, Improper spinal/pelvic alignment, Sensation, Mobility, Strength, Muscle spasms, Coordination, Tone, Pain, UE functional use, Decreased knowledge of use of DME, Vision       Visit Diagnosis: Hemiplegia and  hemiparesis following cerebral infarction affecting left non-dominant side (HCC)  Pain in left arm  Unsteadiness on feet    Problem List Patient Active Problem List  Diagnosis Date Noted  . Neuropathic pain 04/09/2020  . Pain   . Spastic hemiparesis (HCC)   . History of hypertension   . Dyslipidemia   . 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 Leukocytosis 01/12/2020  . Cerebrovascular accident (CVA) due to thrombosis of right middle cerebral artery (HCC)   . Chronic pain syndrome   . Hypokalemia   . Dysphagia, post-stroke   . 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 Tobacco dependence   . Marijuana dependence (HCC)   . Alcohol dependence (HCC)     Kelli Churn, OTR/L 05/26/2020, 2:18 PM  [facility] [address] [place], [place], [address] Phone: [phone]   Fax:  [phone]  Name: Derek [name] MRN: [id number] Date of Birth: 03/15/1976

## 2020-05-26 NOTE — Therapy (Signed)
[facility] [address] [place], [place], [address] Phone: [phone]   Fax:  [phone]  Physical Therapy Treatment  Patient Details  Name: Derek [name] MRN: [id number] Date of Birth: 07-26-1976 Referring Provider (PT): Alger Simons, MD   Encounter Date: 05/26/2020   PT End of Session - 05/26/20 1536    Visit Number 6    Number of Visits 17    Date for PT Re-Evaluation 07/30/20   POC for 8 weeks, Cert for 90 days   Authorization Type BCBS    PT Start Time 1453   pt needing to use restroom at start of session   PT Stop Time 1531    PT Time Calculation (min) 38 min    Equipment Utilized During Treatment Gait belt    Activity Tolerance Patient tolerated treatment well    Behavior During Therapy [facility] for tasks assessed/performed;Flat affect           Past Medical History:  Diagnosis Date  . Acute ischemic right MCA stroke ([place]) 01/07/2020  . Alcohol dependence ([place])   . Back pain   . Marijuana dependence ([place])   . Tobacco dependence     Past Surgical History:  Procedure Laterality Date  . BUBBLE STUDY  01/10/2020   Procedure: BUBBLE STUDY;  Surgeon: Geralynn Rile, MD;  Location: [facility];  Service: Cardiovascular;;  . TEE WITHOUT CARDIOVERSION N/A 01/10/2020   Procedure: TRANSESOPHAGEAL ECHOCARDIOGRAM (TEE);  Surgeon: Geralynn Rile, MD;  Location: [place];  Service: Cardiovascular;  Laterality: N/A;    There were no vitals filed for this visit.   Subjective Assessment - 05/26/20 1450    Subjective Having some mild pain in the shoulder. No changes since he was last here. Exercises are going alright.    Pertinent History Alcohol and Tobacco Use    Limitations Standing;Walking;Sitting    How long can you stand comfortably? 3-5 minutes    Currently in Pain? Yes    Pain Score 4     Pain Location Shoulder    Pain Orientation Left    Pain Descriptors / Indicators Aching    Pain  Type Acute pain    Pain Onset More than a month ago    Pain Relieving Factors pain medications              [facility] PT Assessment - 05/26/20 1503      Timed Up and Go Test   Normal TUG (seconds) 43.65   with hemiwalker                         [facility] Adult PT Treatment/Exercise - 05/26/20 0001      Transfers   Transfers Sit to Stand;Stand to Sit    Sit to Stand 4: Min guard;4: Min assist    Stand to Sit 4: Min guard    Comments 5 reps sit <> stands with no UE support, cues for nose over toes and then once in standing, verbal and tactile cues for pt to bring hips towards therapist on his L for incr weight shifting towards LLE, cues to remain shifted to L with focus on eccentric control when lowering, min A to maintain weight shift. Additional x5 reps staggered stance with LLE posteriorly for incr weight shift/bearing, use of RUE to push to stand and no UE support when descending.       Ambulation/Gait   Ambulation/Gait Yes    Ambulation/Gait Assistance 4:  Min guard    Ambulation/Gait Assistance Details performed from bathroom back into treatment room, cues for hemiwalker placement as pt with tendency to place in front of RLE at times    Ambulation Distance (Feet) 115 Feet    Assistive device Hemi-walker    Gait Pattern Step-through pattern;Decreased step length - left;Decreased stance time - left;Decreased step length - right;Decreased hip/knee flexion - left;Decreased dorsiflexion - left    Ambulation Surface Level;Indoor      Neuro Re-ed    Neuro Re-ed Details  With single UE support on chair to pt's R: 20 reps of forward stepping with RLE and back to midline, with therapist providing min A for weight shifting towards L and maintaining weight shift towards L before stepping, verbal and tactile cues for quad and glute activation when shifting weight - progressing to only using fingertip support, x15 reps of toe taps with RLE to 4" step with single UE support for incr  weight shifting towards LLE. Cues for posture when performing, min guard/min A for balance. Seated at edge of mat x10 reps AAROM knee flexion with use of pillow case to decr friction against floor.                     PT Short Term Goals - 05/26/20 1504      PT SHORT TERM GOAL #1   Title Patient will be independent with Initial HEP with caregiver assitance (for BLE strengthening/stretching)    Time 4    Period Weeks    Status New    Target Date 05/29/20      PT SHORT TERM GOAL #2   Title Patient will demonstrate ability to complete transfer from w/c <> mat with LRAD, supervision level to demo improved independence with functional mobility    Baseline min guard with hemiwalker    Time 4    Period Weeks    Status Partially Met    Target Date 05/29/20      PT SHORT TERM GOAL #3   Title Patient will demonstrate ability to ambulate at least 150 ft w/ LRAD and CGA to demonstrate improved functional mobility    Baseline Min A 75 ft    Time 4    Period Weeks    Status New    Target Date 05/29/20      PT SHORT TERM GOAL #4   Title Patient will improve TUG to </= 45 secs w/ LRAD to demo reduced risk for falls    Baseline 43.65 seconds with hemiwalker on 05/26/20    Time 4    Period Weeks    Status Achieved    Target Date 05/29/20             PT Long Term Goals - 05/01/20 1951      PT LONG TERM GOAL #1   Title Patient will be independent with Final HEP    Time 8    Period Weeks    Status New    Target Date 06/26/20      PT LONG TERM GOAL #2   Title Patient will demonstrate ability to ambulate > 250 ft w/ LRAD and Supv to demo improved community mobility    Time 8    Period Weeks    Status New    Target Date 06/26/20      PT LONG TERM GOAL #3   Title Patient will improve TUG to </= 35 secs w/ LRAD to demo reduced risk for falls  Baseline 58.66 secs w/ hemiwalker    Time 8    Period Weeks    Status New      PT LONG TERM GOAL #4   Title Patient will demo  ability to ambulate at gait speed of >/= 2.0 ft/sec to demo improved functional mobility    Time 8    Period Weeks    Status New    Target Date 06/26/20      PT LONG TERM GOAL #5   Title Patient will demo ability to complete all bed mobility and transfers, Mod I, for improved independence    Time 8    Period Weeks    Status New                 Plan - 05/26/20 1545    Clinical Impression Statement Began to assess pt's STGs today. Pt partially met STG #2 - able to perform stand step transfers from mat table to w/c with min guard with use of hemi walker. Pt met STG #4 in regards to the TUG, pt able to perform today in 43.65 seconds with hemiwalker (previously 58.66), indicating pt is still at an incr risk for falls. Remainder of session focused on LLE>RLE strengthening and weight shifting activities to LLE, pt improving with incr reps. Will continue to progress towards LTGs.    Personal Factors and Comorbidities Comorbidity 2    Comorbidities Chronic Back Pain,HTN, Prior History of Alcohol/Tobacco Use    Examination-Activity Limitations Bed Mobility;Stairs;Stand;Toileting;Transfers;Dressing    Examination-Participation Restrictions Community Activity;Driving;Yard Work;Other   Occupation   Stability/Clinical Decision Making Evolving/Moderate complexity    Rehab Potential Good    PT Frequency 2x / week    PT Duration 8 weeks    PT Treatment/Interventions ADLs/Self Care Home Management;Electrical Stimulation;Aquatic Therapy;Moist Heat;DME Instruction;Gait training;Stair training;Functional mobility training;Therapeutic activities;Therapeutic exercise;Balance training;Neuromuscular re-education;Patient/family education;Orthotic Fit/Training;Wheelchair mobility training;Manual techniques;Passive range of motion    PT Next Visit Plan check remainder of STGs. AFO brace for LLE? Strengthening Exercises for LLE. Gait training. Tall Kneeling, Activites for tone management. Update HEP as needed     Consulted and Agree with Plan of Care Patient           Patient will benefit from skilled therapeutic intervention in order to improve the following deficits and impairments:  Abnormal gait, Decreased balance, Decreased endurance, Decreased mobility, Difficulty walking, Impaired tone, Impaired sensation, Pain, Impaired UE functional use, Impaired flexibility, Decreased strength, Decreased knowledge of use of DME, Decreased activity tolerance, Decreased coordination, Decreased range of motion, Increased muscle spasms  Visit Diagnosis: Hemiplegia and hemiparesis following cerebral infarction affecting left non-dominant side (HCC)  Unsteadiness on feet  Muscle weakness (generalized)  Other disturbances of skin sensation     Problem List Patient Active Problem List   Diagnosis Date Noted  . Neuropathic pain 04/09/2020  . Pain   . Spastic hemiparesis ([place])   . History of hypertension   . Dyslipidemia   . Right middle cerebral artery stroke ([place]) 01/15/2020  . Leukocytosis 01/12/2020  . Cerebrovascular accident (CVA) due to thrombosis of right middle cerebral artery ([place])   . Chronic pain syndrome   . Hypokalemia   . Dysphagia, post-stroke   . Acute ischemic right MCA stroke ([place]) 01/07/2020  . Tobacco dependence   . Marijuana dependence ([place])   . Alcohol dependence ([place])     Arliss Journey, PT, DPT  05/26/2020, 3:46 PM  [facility] [address] [place], [place], [address]  Phone: [phone]   Fax:  [phone]  Name: Derek [name] MRN: [id number] Date of Birth: 02/23/1976

## 2020-05-28 ENCOUNTER — Encounter: Payer: Self-pay | Admitting: Physical Therapy

## 2020-05-28 ENCOUNTER — Other Ambulatory Visit: Payer: Self-pay

## 2020-05-28 ENCOUNTER — Ambulatory Visit: Payer: BC Managed Care – PPO | Admitting: Occupational Therapy

## 2020-05-28 ENCOUNTER — Ambulatory Visit: Payer: BC Managed Care – PPO | Admitting: Physical Therapy

## 2020-05-28 ENCOUNTER — Encounter: Payer: Self-pay | Admitting: Occupational Therapy

## 2020-05-28 DIAGNOSIS — R2689 Other abnormalities of gait and mobility: Secondary | ICD-10-CM | POA: Diagnosis not present

## 2020-05-28 DIAGNOSIS — M6281 Muscle weakness (generalized): Secondary | ICD-10-CM

## 2020-05-28 DIAGNOSIS — R2681 Unsteadiness on feet: Secondary | ICD-10-CM

## 2020-05-28 DIAGNOSIS — I69354 Hemiplegia and hemiparesis following cerebral infarction affecting left non-dominant side: Secondary | ICD-10-CM

## 2020-05-28 DIAGNOSIS — R208 Other disturbances of skin sensation: Secondary | ICD-10-CM

## 2020-05-28 DIAGNOSIS — G8110 Spastic hemiplegia affecting unspecified side: Secondary | ICD-10-CM | POA: Diagnosis not present

## 2020-05-28 DIAGNOSIS — R209 Unspecified disturbances of skin sensation: Secondary | ICD-10-CM | POA: Diagnosis not present

## 2020-05-28 DIAGNOSIS — M79602 Pain in left arm: Secondary | ICD-10-CM | POA: Diagnosis not present

## 2020-05-28 NOTE — Therapy (Signed)
[facility] [address] [place], [place], [address] Phone: [phone]   Fax:  [phone]  Occupational Therapy Treatment  Patient Details  Name: Derek [name] MRN: [id number] Date of Birth: 1976-08-18 Referring Provider (OT): Dr. Jacklynn Lewis   Encounter Date: 05/28/2020   OT End of Session - 05/28/20 1322    Visit Number 5    Number of Visits 17    Date for OT Re-Evaluation 07/02/20    Authorization Type BC/BS - 60 combined visits for all disciplines    Authorization - Visit Number 5    Authorization - Number of Visits 30    OT Start Time 1230    OT Stop Time 1315    OT Time Calculation (min) 45 min    Activity Tolerance Patient tolerated treatment well    Behavior During Therapy [facility] for tasks assessed/performed;Flat affect           Past Medical History:  Diagnosis Date  . 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 Alcohol dependence (HCC)   . Back pain   . Marijuana dependence ([facility])   . Tobacco dependence     Past Surgical History:  Procedure Laterality Date  . BUBBLE STUDY  01/10/2020   Procedure: BUBBLE STUDY;  Surgeon: Sande Rives, MD;  Location: [facility] ENDOSCOPY;  Service: Cardiovascular;;  . TEE WITHOUT CARDIOVERSION N/A 01/10/2020   Procedure: TRANSESOPHAGEAL ECHOCARDIOGRAM (TEE);  Surgeon: Sande Rives, MD;  Location: [facility] ENDOSCOPY;  Service: Cardiovascular;  Laterality: N/A;    There were no vitals filed for this visit.   Subjective Assessment - 05/28/20 1238    Subjective  Pain 4/10 in shoulder    Currently in Pain? Yes    Pain Score 4     Pain Location Shoulder    Pain Orientation Left    Pain Descriptors / Indicators Aching    Pain Type Acute pain    Pain Onset More than a month ago    Pain Frequency Intermittent    Aggravating Factors  positions                        OT Treatments/Exercises (OP) - 05/28/20 0001      Neurological  Re-education Exercises   Other Exercises 1 Patient with pain 4/10 at start of session.  Guarding in left arm with all transitional movements.  Worked on scooting, to edge of chair, sit to stand with emphasis on lower trunk intiated movement, and some simple trunk rotation to break up stiffness.  In sidelying worked on body on arm to reduce tension in left arm.  Slight traction to pull humeral head out from under acromion, then gentle movement of GH joint.  Patient with reduction in symptoms.  Beginning to work on isolated elbow, forearm, and shoulder motion in sidelying then sitting.,  Patient indicates he is sheduled for Botox shot 8/18.                    OT Short Term Goals - 05/19/20 1408      OT SHORT TERM GOAL #1   Title Independent with HEP for LUE    Status On-going      OT SHORT TERM GOAL #2   Title Pt to be independent with splint wear and care prn    Status On-going      OT SHORT TERM GOAL #3   Title Pt to verbalize understanding with pain management  strategies and proper positioning of LUE    Status On-going      OT SHORT TERM GOAL #4   Title Pt to bathe self with min assist seated    Status On-going      OT SHORT TERM GOAL #5   Title Pt to perform UE dressing with min assist and LE dressing with mod assist    Status On-going      OT SHORT TERM GOAL #6   Title Pt to tolerate passive shoulder flexion to 90* supine w/ pain 5/10 or under    Status On-going             OT Long Term Goals - 05/01/20 1728      OT LONG TERM GOAL #1   Title Pt to tolerate 75% PROM LUE with pain 3/10 or under    Time 8    Period Weeks    Status New      OT LONG TERM GOAL #2   Title Pt to use LUE as stabalizer for bilateral tasks    Time 8    Period Weeks    Status New      OT LONG TERM GOAL #3   Title Pt to verbalize understanding with potential A/E needs and task modifications to increase ease with ADLS and simple IADLS    Time 8    Period Weeks    Status New       OT LONG TERM GOAL #4   Title Pt to be mod I level with bathing and UE dressing and min assist for LE dressing    Time 8    Period Weeks    Status New      OT LONG TERM GOAL #5   Title Pt to perform toileting at mod I level    Time 8    Period Weeks    Status New      Long Term Additional Goals   Additional Long Term Goals Yes      OT LONG TERM GOAL #6   Title Pt to perform simple snack prep/sandwich prep from w/c level prn or w/ use of walker and walker tray    Time 8    Period Weeks    Status New                 Plan - 05/28/20 1323    Clinical Impression Statement Patient with significant hypertonicity leading to consistent shoulder pain - plan for botox next month    OT Frequency 2x / week    OT Duration 8 weeks    OT Treatment/Interventions Self-care/ADL training;Therapeutic exercise;Functional Mobility Training;Aquatic Therapy;Neuromuscular education;Manual Therapy;Splinting;Therapeutic activities;Coping strategies training;DME and/or AE instruction;Cognitive remediation/compensation;Electrical Stimulation;Visual/perceptual remediation/compensation;Moist Heat;Passive range of motion;Patient/family education    Plan continue body on arm motion, needs weight shifting to left in sitting and standing    OT Home Exercise Plan Initiated supine body on arm with partial rolling - girlfriend not present    Consulted and Agree with Plan of Care Patient           Patient will benefit from skilled therapeutic intervention in order to improve the following deficits and impairments:           Visit Diagnosis: Hemiplegia and hemiparesis following cerebral infarction affecting left non-dominant side (HCC)  Unsteadiness on feet  Muscle weakness (generalized)  Other disturbances of skin sensation  Pain in left arm  Spastic hemiplegia of left nondominant 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 (  [facility])    Problem List Patient Active Problem List   Diagnosis Date  Noted  . Neuropathic pain 04/09/2020  . Pain   . Spastic hemiparesis (HCC)   . History of hypertension   . Dyslipidemia   . 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 Leukocytosis 01/12/2020  . Cerebrovascular accident (CVA) due to thrombosis of right middle cerebral artery (HCC)   . Chronic pain syndrome   . Hypokalemia   . Dysphagia, post-stroke   . 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 Tobacco dependence   . Marijuana dependence (HCC)   . Alcohol dependence (HCC)     Collier Salina, OTR/L 05/28/2020, 1:24 PM  [facility] [address] [place], [place], [address] Phone: [phone]   Fax:  [phone]  Name: Derek [name] MRN: [id number] Date of Birth: 1976-06-05

## 2020-05-28 NOTE — Therapy (Signed)
[facility] [address] [place], [place], [address] Phone: [phone]   Fax:  [phone]  Physical Therapy Treatment  Patient Details  Name: Derek [name] MRN: [id number] Date of Birth: [birth year] Referring Provider (PT): Alger Simons, MD   Encounter Date: 05/28/2020   PT End of Session - 05/28/20 1600    Visit Number 7    Number of Visits 17    Date for PT Re-Evaluation 07/30/20   POC for 8 weeks, Cert for 90 days   Authorization Type BCBS    PT Start Time 1601    PT Stop Time 1400    PT Time Calculation (min) 43 min    Equipment Utilized During Treatment Gait belt    Activity Tolerance Patient tolerated treatment well    Behavior During Therapy [facility] for tasks assessed/performed;Flat affect           Past Medical History:  Diagnosis Date  . Acute ischemic right MCA stroke ([place]) 01/07/2020  . Alcohol dependence ([place])   . Back pain   . Marijuana dependence ([place])   . Tobacco dependence     Past Surgical History:  Procedure Laterality Date  . BUBBLE STUDY  01/10/2020   Procedure: BUBBLE STUDY;  Surgeon: Geralynn Rile, MD;  Location: [facility];  Service: Cardiovascular;;  . TEE WITHOUT CARDIOVERSION N/A 01/10/2020   Procedure: TRANSESOPHAGEAL ECHOCARDIOGRAM (TEE);  Surgeon: Geralynn Rile, MD;  Location: [facility];  Service: Cardiovascular;  Laterality: N/A;    There were no vitals filed for this visit.   Subjective Assessment - 05/28/20 1320    Subjective No falls. Leg was a little sore after last time.    Pertinent History Alcohol and Tobacco Use    Limitations Standing;Walking;Sitting    How long can you stand comfortably? 3-5 minutes    Currently in Pain? Yes    Pain Score 3     Pain Location Shoulder    Pain Orientation Left    Pain Descriptors / Indicators Aching    Pain Type Acute pain    Pain Onset More than a month ago    Pain Relieving Factors loosened up a little  bit with OT session                             [facility] Adult PT Treatment/Exercise - 05/28/20 0001      Transfers   Transfers Sit to Stand;Stand to Sit    Sit to Stand 4: Min guard;With upper extremity assist    Stand to Sit 4: Min guard    Comments x5 reps sit <> stands with use of 2" block under RLE for incr weight bearing and weight shifting towards LLE, use of mirror as visual cue for weight shifting towards L in standing and maintaining weight shift to L while sitting with eccentric control with no UE support      Neuro Re-ed    Neuro Re-ed Details  With single UE support on chair to pt's R: use of mirror as visual feedback for weight shifting towards L, x10 reps of shifting weight from R/L beginning with single UE support and progressing to none and then 20 reps of forward stepping with RLE and back to midline, with therapist providing min A for weight shifting towards L and maintaining weight shift towards L before stepping, verbal and tactile cues for quad and glute activation when shifting weight - needing UE support  on chair for balance.       Exercises   Exercises Other Exercises    Other Exercises  performed x10 reps supine heel slides on LLE on mat table with AAROM from therapist, pt with incr spasticity at times when performing              Access Code: [email or web address] URL: [email or web address] Date: 05/28/2020 Prepared by: Janann August  Reviewed HEP and instead of supine heel slides, revised to include seated heel slides   Exercises Supine Bridge - 1 x daily - 5 x weekly - 3 sets - 5 reps - cues to lift up first with L ASIS for incr LLE activation  Bent Knee Fallouts - 1 x daily - 5 x weekly - 3 sets - 5 reps - progressed to using red theraband  Sit to Stand with Armchair - 1 x daily - 5 x weekly - 3 sets - 5 reps Seated Heel Slide - 1 x daily - 5 x weekly - 2 sets - 5 reps - in sitting with use of towel under LLE to decr friction, need  for family support to help extend LLE when performing      PT Education - 05/28/20 1559    Education Details reviewed HEP and revisions as appropriate.    Person(s) Educated Patient    Methods Explanation;Demonstration;Handout    Comprehension Verbalized understanding;Returned demonstration;Need further instruction            PT Short Term Goals - 05/28/20 1601      PT SHORT TERM GOAL #1   Title Patient will be independent with Initial HEP with caregiver assitance (for BLE strengthening/stretching)    Baseline needed review for proper technique of HEP    Time 4    Period Weeks    Status Partially Met    Target Date 05/29/20      PT SHORT TERM GOAL #2   Title Patient will demonstrate ability to complete transfer from w/c <> mat with LRAD, supervision level to demo improved independence with functional mobility    Baseline min guard with hemiwalker    Time 4    Period Weeks    Status Partially Met    Target Date 05/29/20      PT SHORT TERM GOAL #3   Title Patient will demonstrate ability to ambulate at least 150 ft w/ LRAD and CGA to demonstrate improved functional mobility    Baseline Min A 75 ft    Time 4    Period Weeks    Status New    Target Date 05/29/20      PT SHORT TERM GOAL #4   Title Patient will improve TUG to </= 45 secs w/ LRAD to demo reduced risk for falls    Baseline 43.65 seconds with hemiwalker on 05/26/20    Time 4    Period Weeks    Status Achieved    Target Date 05/29/20             PT Long Term Goals - 05/01/20 1951      PT LONG TERM GOAL #1   Title Patient will be independent with Final HEP    Time 8    Period Weeks    Status New    Target Date 06/26/20      PT LONG TERM GOAL #2   Title Patient will demonstrate ability to ambulate > 250 ft w/ LRAD and Supv to demo improved community mobility    Time 8  Period Weeks    Status New    Target Date 06/26/20      PT LONG TERM GOAL #3   Title Patient will improve TUG to </= 35 secs  w/ LRAD to demo reduced risk for falls    Baseline 58.66 secs w/ hemiwalker    Time 8    Period Weeks    Status New      PT LONG TERM GOAL #4   Title Patient will demo ability to ambulate at gait speed of >/= 2.0 ft/sec to demo improved functional mobility    Time 8    Period Weeks    Status New    Target Date 06/26/20      PT LONG TERM GOAL #5   Title Patient will demo ability to complete all bed mobility and transfers, Mod I, for improved independence    Time 8    Period Weeks    Status New                 Plan - 05/28/20 1601    Clinical Impression Statement Reviewed HEP today with pt reporting not performing all exercises at home. Pt unable to perform supine heel slides on LLE - instead modified to perform in seated with towel under LLE to decr friction and needing assist from family to slide leg out. Pt with incr spasticity today with supine exercises, especially when performing heel slides. Remainder of session focused on NMR for weight shifting towards LLE, pt did well with use of mirror as visual cue. Will continue to progress towards LTGs.    Personal Factors and Comorbidities Comorbidity 2    Comorbidities Chronic Back Pain,HTN, Prior History of Alcohol/Tobacco Use    Examination-Activity Limitations Bed Mobility;Stairs;Stand;Toileting;Transfers;Dressing    Examination-Participation Restrictions Community Activity;Driving;Yard Work;Other   Occupation   Stability/Clinical Decision Making Evolving/Moderate complexity    Rehab Potential Good    PT Frequency 2x / week    PT Duration 8 weeks    PT Treatment/Interventions ADLs/Self Care Home Management;Electrical Stimulation;Aquatic Therapy;Moist Heat;DME Instruction;Gait training;Stair training;Functional mobility training;Therapeutic activities;Therapeutic exercise;Balance training;Neuromuscular re-education;Patient/family education;Orthotic Fit/Training;Wheelchair mobility training;Manual techniques;Passive range of  motion    PT Next Visit Plan check last goal for STG (gait goal). AFO brace for LLE? Strengthening Exercises for LLE. Gait training. Tall Kneeling, Activites for tone management. Update HEP as needed    Consulted and Agree with Plan of Care Patient           Patient will benefit from skilled therapeutic intervention in order to improve the following deficits and impairments:  Abnormal gait, Decreased balance, Decreased endurance, Decreased mobility, Difficulty walking, Impaired tone, Impaired sensation, Pain, Impaired UE functional use, Impaired flexibility, Decreased strength, Decreased knowledge of use of DME, Decreased activity tolerance, Decreased coordination, Decreased range of motion, Increased muscle spasms  Visit Diagnosis: Hemiplegia and hemiparesis following cerebral infarction affecting left non-dominant side (HCC)  Unsteadiness on feet  Muscle weakness (generalized)     Problem List Patient Active Problem List   Diagnosis Date Noted  . Neuropathic pain 04/09/2020  . Pain   . Spastic hemiparesis ([place])   . History of hypertension   . Dyslipidemia   . 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 Leukocytosis 01/12/2020  . Cerebrovascular accident (CVA) due to thrombosis of right middle cerebral artery ([place])   . Chronic pain syndrome   . Hypokalemia   . Dysphagia, post-stroke   . Acute ischemic right MCA stroke ([place]) 01/07/2020  . Tobacco dependence   .  Marijuana dependence ([place])   . Alcohol dependence ([place])     Arliss Journey , PT, DPT  05/28/2020, 4:07 PM  [facility] [address] [place], [place], [address] Phone: [phone]   Fax:  [phone]  Name: Derek [name] MRN: [id number] Date of Birth: Aug 13, 1976

## 2020-05-28 NOTE — Patient Instructions (Signed)
Access Code: [email or web address] URL: [email or web address] Date: 05/28/2020 Prepared by: Sherlie Ban  Exercises Supine Bridge - 1 x daily - 5 x weekly - 3 sets - 5 reps Bent Knee Fallouts - 1 x daily - 5 x weekly - 3 sets - 5 reps Sit to Stand with Armchair - 1 x daily - 5 x weekly - 3 sets - 5 reps Seated Heel Slide - 1 x daily - 5 x weekly - 2 sets - 5 reps

## 2020-06-03 ENCOUNTER — Encounter: Payer: Self-pay | Admitting: Occupational Therapy

## 2020-06-03 ENCOUNTER — Ambulatory Visit: Payer: BC Managed Care – PPO

## 2020-06-03 ENCOUNTER — Ambulatory Visit: Payer: BC Managed Care – PPO | Attending: Physical Medicine & Rehabilitation | Admitting: Occupational Therapy

## 2020-06-03 ENCOUNTER — Other Ambulatory Visit: Payer: Self-pay

## 2020-06-03 DIAGNOSIS — I69354 Hemiplegia and hemiparesis following cerebral infarction affecting left non-dominant side: Secondary | ICD-10-CM | POA: Insufficient documentation

## 2020-06-03 DIAGNOSIS — R2689 Other abnormalities of gait and mobility: Secondary | ICD-10-CM

## 2020-06-03 DIAGNOSIS — R208 Other disturbances of skin sensation: Secondary | ICD-10-CM

## 2020-06-03 DIAGNOSIS — M6281 Muscle weakness (generalized): Secondary | ICD-10-CM

## 2020-06-03 DIAGNOSIS — R209 Unspecified disturbances of skin sensation: Secondary | ICD-10-CM | POA: Diagnosis not present

## 2020-06-03 DIAGNOSIS — R2681 Unsteadiness on feet: Secondary | ICD-10-CM | POA: Insufficient documentation

## 2020-06-03 DIAGNOSIS — G8110 Spastic hemiplegia affecting unspecified side: Secondary | ICD-10-CM | POA: Diagnosis not present

## 2020-06-03 DIAGNOSIS — M79602 Pain in left arm: Secondary | ICD-10-CM | POA: Insufficient documentation

## 2020-06-03 NOTE — Therapy (Signed)
[facility] [address] [place], [place], [address] Phone: [phone]   Fax:  [phone]  Occupational Therapy Treatment  Patient Details  Name: Derek [name] MRN: [id number] Date of Birth: [birth year] Referring Provider (OT): Dr. Jacklynn Lewis   Encounter Date: 06/03/2020   OT End of Session - 06/03/20 1844    Visit Number 6    Number of Visits 17    Date for OT Re-Evaluation 07/02/20    Authorization Type BC/BS - 60 combined visits for all disciplines    Authorization - Visit Number 6    Authorization - Number of Visits 30    OT Start Time 1700    OT Stop Time 1750    OT Time Calculation (min) 50 min    Activity Tolerance Patient tolerated treatment well    Behavior During Therapy [facility] for tasks assessed/performed           Past Medical History:  Diagnosis Date  . 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 Alcohol dependence ([facility])   . Back pain   . Marijuana dependence ([facility])   . Tobacco dependence     Past Surgical History:  Procedure Laterality Date  . BUBBLE STUDY  01/10/2020   Procedure: BUBBLE STUDY;  Surgeon: Sande Rives, MD;  Location: [facility] ENDOSCOPY;  Service: Cardiovascular;;  . TEE WITHOUT CARDIOVERSION N/A 01/10/2020   Procedure: TRANSESOPHAGEAL ECHOCARDIOGRAM (TEE);  Surgeon: Sande Rives, MD;  Location[facility] ENDOSCOPY;  Service: Cardiovascular;  Laterality: N/A;    There were no vitals filed for this visit.   Subjective Assessment - 06/03/20 1838    Subjective  Shoulder pain at 4/10    Currently in Pain? Yes    Pain Score 4     Pain Location Shoulder    Pain Orientation Left    Pain Descriptors / Indicators Aching    Pain Type Chronic pain    Pain Onset More than a month ago    Pain Frequency Constant    Aggravating Factors  movement    Pain Relieving Factors rest/reposition                        OT Treatments/Exercises (OP) - 06/03/20 1839       ADLs   Toileting Patient ambulated to toilet from bathroom door without hemiwalker and with min assistance.  Patient stood to urinate and able to manage clothing and hygiene with supervision.        Neurological Re-education Exercises   Other Exercises 1 Pain 4/10 at start of session, guarding left shoulder.  Worked supine moist heat on shoulder with gentle range of motion and relaxation.  Worked in sidelying on left side to address isolated elbow flex/ext, pro/supination.  Today patient able to sustain contraction and change direction on demand.  Patient scheduled for Botox later this month - hypertonicity and unbalanced muscle activity is believed to be contributing to consistent shoulder pain and stiffness.      Other Exercises 2 In sitting worked to allow body to rotate over stable arm in gradual increasing ranges.  Pain 0/10 at end of session                  OT Education - 06/03/20 1843    Education Details need to reduc guarding, and maintaining dependent arm position to avoid frozen shoulder    Person(s) Educated Patient    Methods Explanation    Comprehension Need  further instruction            OT Short Term Goals - 05/19/20 1408      OT SHORT TERM GOAL #1   Title Independent with HEP for LUE    Status On-going      OT SHORT TERM GOAL #2   Title Pt to be independent with splint wear and care prn    Status On-going      OT SHORT TERM GOAL #3   Title Pt to verbalize understanding with pain management strategies and proper positioning of LUE    Status On-going      OT SHORT TERM GOAL #4   Title Pt to bathe self with min assist seated    Status On-going      OT SHORT TERM GOAL #5   Title Pt to perform UE dressing with min assist and LE dressing with mod assist    Status On-going      OT SHORT TERM GOAL #6   Title Pt to tolerate passive shoulder flexion to 90* supine w/ pain 5/10 or under    Status On-going             OT Long Term Goals - 05/01/20  1728      OT LONG TERM GOAL #1   Title Pt to tolerate 75% PROM LUE with pain 3/10 or under    Time 8    Period Weeks    Status New      OT LONG TERM GOAL #2   Title Pt to use LUE as stabalizer for bilateral tasks    Time 8    Period Weeks    Status New      OT LONG TERM GOAL #3   Title Pt to verbalize understanding with potential A/E needs and task modifications to increase ease with ADLS and simple IADLS    Time 8    Period Weeks    Status New      OT LONG TERM GOAL #4   Title Pt to be mod I level with bathing and UE dressing and min assist for LE dressing    Time 8    Period Weeks    Status New      OT LONG TERM GOAL #5   Title Pt to perform toileting at mod I level    Time 8    Period Weeks    Status New      Long Term Additional Goals   Additional Long Term Goals Yes      OT LONG TERM GOAL #6   Title Pt to perform simple snack prep/sandwich prep from w/c level prn or w/ use of walker and walker tray    Time 8    Period Weeks    Status New                 Plan - 06/03/20 1846    Clinical Impression Statement Patient with consistent report of shoulder pain.  Pain reduces with gentle mobility, but patient guarded and developing stiffness in left shoulder girdle.    OT Frequency 2x / week    OT Duration 8 weeks    OT Treatment/Interventions Self-care/ADL training;Therapeutic exercise;Functional Mobility Training;Aquatic Therapy;Neuromuscular education;Manual Therapy;Splinting;Therapeutic activities;Coping strategies training;DME and/or AE instruction;Cognitive remediation/compensation;Electrical Stimulation;Visual/perceptual remediation/compensation;Moist Heat;Passive range of motion;Patient/family education    Plan bed positioning, lying on right side, continue body on arm motion in sidelying, seated, weight shift left in sitting and standing    OT Home  Exercise Plan Initiated supine body on arm with partial rolling - girlfriend not present    Consulted and  Agree with Plan of Care Patient           Patient will benefit from skilled therapeutic intervention in order to improve the following deficits and impairments:           Visit Diagnosis: Hemiplegia and hemiparesis following cerebral infarction affecting left non-dominant side (HCC)  Unsteadiness on feet  Muscle weakness (generalized)  Other disturbances of skin sensation  Pain in left arm  Spastic hemiplegia of left nondominant 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 [facility])    Problem List Patient Active Problem List   Diagnosis Date Noted  . Neuropathic pain 04/09/2020  . Pain   . Spastic hemiparesis (HCC)   . History of hypertension   . Dyslipidemia   . 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 Leukocytosis 01/12/2020  . Cerebrovascular accident (CVA) due to thrombosis of right middle cerebral artery (HCC)   . Chronic pain syndrome   . Hypokalemia   . Dysphagia, post-stroke   . 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 Tobacco dependence   . Marijuana dependence (HCC)   . Alcohol dependence [facility])     Collier Salina, OTR/L 06/03/2020, 6:47 PM  [facility] [address] [place], [place], [address] Phone: [phone]   Fax:  [phone]  Name: Derek [name] MRN: [id number] Date of Birth: 02-12-1976

## 2020-06-03 NOTE — Therapy (Signed)
[facility] [address] [place], [place], [address] Phone: [phone]   Fax:  [phone]  Physical Therapy Treatment  Patient Details  Name: Derek [name] MRN: [id number] Date of Birth: 03-24-1976 Referring Provider (PT): Alger Simons, MD   Encounter Date: 06/03/2020   PT End of Session - 06/03/20 1617    Visit Number 8    Number of Visits 17    Date for PT Re-Evaluation 07/30/20   POC for 8 weeks, Cert for 90 days   Authorization Type BCBS    PT Start Time 1612    PT Stop Time 1656    PT Time Calculation (min) 44 min    Equipment Utilized During Treatment Gait belt    Activity Tolerance Patient tolerated treatment well    Behavior During Therapy [facility] for tasks assessed/performed;Flat affect           Past Medical History:  Diagnosis Date  . Acute ischemic right MCA stroke ([place]) 01/07/2020  . Alcohol dependence ([place])   . Back pain   . Marijuana dependence ([place])   . Tobacco dependence     Past Surgical History:  Procedure Laterality Date  . BUBBLE STUDY  01/10/2020   Procedure: BUBBLE STUDY;  Surgeon: Geralynn Rile, MD;  Location: [facility];  Service: Cardiovascular;;  . TEE WITHOUT CARDIOVERSION N/A 01/10/2020   Procedure: TRANSESOPHAGEAL ECHOCARDIOGRAM (TEE);  Surgeon: Geralynn Rile, MD;  Location: [facility];  Service: Cardiovascular;  Laterality: N/A;    There were no vitals filed for this visit.   Subjective Assessment - 06/03/20 1615    Subjective Patient reports some mild pain in the shoulder today. No falls.    Pertinent History Alcohol and Tobacco Use    Limitations Standing;Walking;Sitting    How long can you stand comfortably? 3-5 minutes    Currently in Pain? Yes    Pain Score 4     Pain Location Shoulder    Pain Orientation Left    Pain Descriptors / Indicators Aching    Pain Type Acute pain    Pain Onset More than a month ago    Pain Frequency Intermittent                              [facility] Adult PT Treatment/Exercise - 06/03/20 0001      Transfers   Transfers Sit to Stand;Stand to Sit    Sit to Stand 4: Min guard    Stand to Sit 4: Min guard    Comments Seated on edge of mat, completed sit <> stands with staggered stance (LLE positioned slightly posterior) for improved weight shift and strengthening, completed 1 x 10 reps with rest breaks between sets. Visual feedback provided via mirror. PT providing manual faciliation for improved weight shift to LLE.        Ambulation/Gait   Ambulation/Gait Yes    Ambulation/Gait Assistance 4: Min guard    Ambulation/Gait Assistance Details completed gait training with hemiwalker, patient able to ambulate 230 ft with CGA from PT. Completed gait training without AD x 115 ft with PT providing Min A on L side and PT tech provided HHA on R side. PT providing verbal cues for improved step length.     Ambulation Distance (Feet) 230 Feet    x 1, 115 ft x 1   Assistive device Hemi-walker    Gait Pattern Step-through pattern;Decreased step length - left;Decreased stance time -  left;Decreased step length - right;Decreased hip/knee flexion - left;Decreased dorsiflexion - left    Ambulation Surface Level;Indoor      Self-Care   Self-Care Other Self-Care Comments    Other Self-Care Comments  Patient lost HEP print out, PT provided new copy for patient.       Neuro Re-ed    Neuro Re-ed Details  Standing without UE support and mirror for visual feedback, completed forward and backward stepping with RLE x 15 reps. PT providing manual faciliation for improved weight shift to LLE with completion. Verbal cues for improved step height and length with RLE. Standing without UE support, completing step to 4" step and hold for 5 seconds to further promote balance and LLE strength, completed x 10 reps. PT providing min A at times for maintaining balance.                     PT Short Term Goals -  06/03/20 1619      PT SHORT TERM GOAL #1   Title Patient will be independent with Initial HEP with caregiver assitance (for BLE strengthening/stretching)    Baseline needed review for proper technique of HEP    Time 4    Period Weeks    Status Partially Met    Target Date 05/29/20      PT SHORT TERM GOAL #2   Title Patient will demonstrate ability to complete transfer from w/c <> mat with LRAD, supervision level to demo improved independence with functional mobility    Baseline min guard with hemiwalker    Time 4    Period Weeks    Status Partially Met    Target Date 05/29/20      PT SHORT TERM GOAL #3   Title Patient will demonstrate ability to ambulate at least 150 ft w/ LRAD and CGA to demonstrate improved functional mobility    Baseline Patient able to ambulate 230 ft CGA ([place]) with Hemiwalker    Time 4    Period Weeks    Status Achieved    Target Date 05/29/20      PT SHORT TERM GOAL #4   Title Patient will improve TUG to </= 45 secs w/ LRAD to demo reduced risk for falls    Baseline 43.65 seconds with hemiwalker on 05/26/20    Time 4    Period Weeks    Status Achieved    Target Date 05/29/20             PT Long Term Goals - 05/01/20 1951      PT LONG TERM GOAL #1   Title Patient will be independent with Final HEP    Time 8    Period Weeks    Status New    Target Date 06/26/20      PT LONG TERM GOAL #2   Title Patient will demonstrate ability to ambulate > 250 ft w/ LRAD and Supv to demo improved community mobility    Time 8    Period Weeks    Status New    Target Date 06/26/20      PT LONG TERM GOAL #3   Title Patient will improve TUG to </= 35 secs w/ LRAD to demo reduced risk for falls    Baseline 58.66 secs w/ hemiwalker    Time 8    Period Weeks    Status New      PT LONG TERM GOAL #4   Title Patient will demo ability to  ambulate at gait speed of >/= 2.0 ft/sec to demo improved functional mobility    Time 8    Period Weeks    Status New     Target Date 06/26/20      PT LONG TERM GOAL #5   Title Patient will demo ability to complete all bed mobility and transfers, Mod I, for improved independence    Time 8    Period Weeks    Status New                 Plan - 06/03/20 1654    Clinical Impression Statement Today's skilled PT session included assessment of patient's progress toward STG #3, with patient demo ability to ambulate 230 ft with hemi walker and CGA. Continued rest of session focused on activities including improved weight shift to LLE and continued gait training without AD and Min A from PT. Patient is demonstrating progress toward goals with PT services and will continue to benefit from skilled PT services.    Personal Factors and Comorbidities Comorbidity 2    Comorbidities Chronic Back Pain,HTN, Prior History of Alcohol/Tobacco Use    Examination-Activity Limitations Bed Mobility;Stairs;Stand;Toileting;Transfers;Dressing    Examination-Participation Restrictions Community Activity;Driving;Yard Work;Other   Occupation   Stability/Clinical Decision Making Evolving/Moderate complexity    Rehab Potential Good    PT Frequency 2x / week    PT Duration 8 weeks    PT Treatment/Interventions ADLs/Self Care Home Management;Electrical Stimulation;Aquatic Therapy;Moist Heat;DME Instruction;Gait training;Stair training;Functional mobility training;Therapeutic activities;Therapeutic exercise;Balance training;Neuromuscular re-education;Patient/family education;Orthotic Fit/Training;Wheelchair mobility training;Manual techniques;Passive range of motion    PT Next Visit Plan AFO brace for LLE? Strengthening Exercises for LLE. Gait training. Tall Kneeling, Activites for tone management. Update HEP as needed    Consulted and Agree with Plan of Care Patient           Patient will benefit from skilled therapeutic intervention in order to improve the following deficits and impairments:  Abnormal gait, Decreased balance,  Decreased endurance, Decreased mobility, Difficulty walking, Impaired tone, Impaired sensation, Pain, Impaired UE functional use, Impaired flexibility, Decreased strength, Decreased knowledge of use of DME, Decreased activity tolerance, Decreased coordination, Decreased range of motion, Increased muscle spasms  Visit Diagnosis: Hemiplegia and hemiparesis following cerebral infarction affecting left non-dominant side (HCC)  Unsteadiness on feet  Muscle weakness (generalized)  Other abnormalities of gait and mobility     Problem List Patient Active Problem List   Diagnosis Date Noted  . Neuropathic pain 04/09/2020  . Pain   . Spastic hemiparesis ([place])   . History of hypertension   . Dyslipidemia   . 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 Leukocytosis 01/12/2020  . Cerebrovascular accident (CVA) due to thrombosis of right middle cerebral artery ([place])   . Chronic pain syndrome   . Hypokalemia   . Dysphagia, post-stroke   . Acute ischemic right MCA stroke ([place]) 01/07/2020  . Tobacco dependence   . Marijuana dependence ([place])   . Alcohol dependence ([place])     Jones Bales, PT, DPT 06/03/2020, 4:58 PM  [facility] [address] [place], [place], [address] Phone: [phone]   Fax:  [phone]  Name: Derek [name] MRN: [id number] Date of Birth: Sep 19, 1976

## 2020-06-05 ENCOUNTER — Other Ambulatory Visit: Payer: Self-pay

## 2020-06-05 ENCOUNTER — Ambulatory Visit: Payer: BC Managed Care – PPO | Admitting: Occupational Therapy

## 2020-06-05 ENCOUNTER — Encounter: Payer: Self-pay | Admitting: Occupational Therapy

## 2020-06-05 ENCOUNTER — Ambulatory Visit: Payer: BC Managed Care – PPO

## 2020-06-05 DIAGNOSIS — R2681 Unsteadiness on feet: Secondary | ICD-10-CM

## 2020-06-05 DIAGNOSIS — R208 Other disturbances of skin sensation: Secondary | ICD-10-CM

## 2020-06-05 DIAGNOSIS — M6281 Muscle weakness (generalized): Secondary | ICD-10-CM

## 2020-06-05 DIAGNOSIS — G8110 Spastic hemiplegia affecting unspecified side: Secondary | ICD-10-CM

## 2020-06-05 DIAGNOSIS — R209 Unspecified disturbances of skin sensation: Secondary | ICD-10-CM | POA: Diagnosis not present

## 2020-06-05 DIAGNOSIS — I69354 Hemiplegia and hemiparesis following cerebral infarction affecting left non-dominant side: Secondary | ICD-10-CM

## 2020-06-05 DIAGNOSIS — R2689 Other abnormalities of gait and mobility: Secondary | ICD-10-CM | POA: Diagnosis not present

## 2020-06-05 DIAGNOSIS — M79602 Pain in left arm: Secondary | ICD-10-CM

## 2020-06-05 NOTE — Therapy (Signed)
[facility] [address] [place], [place], [address] Phone: [phone]   Fax:  [phone]  Occupational Therapy Treatment  Patient Details  Name: Derek [name] MRN: [id number] Date of Birth: [birth year] Referring Provider (OT): Dr. Jacklynn Lewis   Encounter Date: 06/05/2020   OT End of Session - 06/05/20 1845    Visit Number 7    Number of Visits 17    Date for OT Re-Evaluation 07/02/20    Authorization Type BC/BS - 60 combined visits for all disciplines    Authorization - Visit Number 7    Authorization - Number of Visits 30    OT Start Time 1705    OT Stop Time 1745    OT Time Calculation (min) 40 min    Activity Tolerance Patient limited by pain    Behavior During Therapy Anxious           Past Medical History:  Diagnosis Date  . 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 Alcohol dependence ([facility])   . Back pain   . Marijuana dependence ([facility])   . Tobacco dependence     Past Surgical History:  Procedure Laterality Date  . BUBBLE STUDY  01/10/2020   Procedure: BUBBLE STUDY;  Surgeon: Sande Rives, MD;  Location: [facility] ENDOSCOPY;  Service: Cardiovascular;;  . TEE WITHOUT CARDIOVERSION N/A 01/10/2020   Procedure: TRANSESOPHAGEAL ECHOCARDIOGRAM (TEE);  Surgeon: Sande Rives, MD;  Location: [facility] ENDOSCOPY;  Service: Cardiovascular;  Laterality: N/A;    There were no vitals filed for this visit.                 OT Treatments/Exercises (OP) - 06/05/20 1842      Neurological Re-education Exercises   Other Exercises 1 Relaxation techniques to address significant shoulder pain this session.  Patient with muscle spasming with all attempts at movement- specifically transitional movements.  Patient unable to transition from sideliying to sitting toward right side today due to pain and spasm.        Ultrasound   Ultrasound Location Shoulder left    Ultrasound Parameters ,  continuous, 0.8 w/cm2, x 12 min    Ultrasound Goals Pain                  OT Education - 06/05/20 1845    Education Details patient inyerested in trying topical treatment for left shoulder - advised to talk with doctor first    Person(s) Educated Patient    Methods Explanation    Comprehension Verbalized understanding            OT Short Term Goals - 06/05/20 1846      OT SHORT TERM GOAL #1   Title Independent with HEP for LUE    Status On-going      OT SHORT TERM GOAL #2   Title Pt to be independent with splint wear and care prn    Status On-going      OT SHORT TERM GOAL #3   Title Pt to verbalize understanding with pain management strategies and proper positioning of LUE    Status On-going      OT SHORT TERM GOAL #4   Title Pt to bathe self with min assist seated    Status On-going      OT SHORT TERM GOAL #5   Title Pt to perform UE dressing with min assist and LE dressing with mod assist    Status On-going  OT Long Term Goals - 05/01/20 1728      OT LONG TERM GOAL #1   Title Pt to tolerate 75% PROM LUE with pain 3/10 or under    Time 8    Period Weeks    Status New      OT LONG TERM GOAL #2   Title Pt to use LUE as stabalizer for bilateral tasks    Time 8    Period Weeks    Status New      OT LONG TERM GOAL #3   Title Pt to verbalize understanding with potential A/E needs and task modifications to increase ease with ADLS and simple IADLS    Time 8    Period Weeks    Status New      OT LONG TERM GOAL #4   Title Pt to be mod I level with bathing and UE dressing and min assist for LE dressing    Time 8    Period Weeks    Status New      OT LONG TERM GOAL #5   Title Pt to perform toileting at mod I level    Time 8    Period Weeks    Status New      Long Term Additional Goals   Additional Long Term Goals Yes      OT LONG TERM GOAL #6   Title Pt to perform simple snack prep/sandwich prep from w/c level prn or w/ use of  walker and walker tray    Time 8    Period Weeks    Status New                 Plan - 06/05/20 1847    Clinical Impression Statement Patient with consistent report of shoulder pain.  Pain reduces with gentle mobility, but patient guarded and developing stiffness in left shoulder girdle.    OT Frequency 2x / week    OT Duration 8 weeks    OT Treatment/Interventions Self-care/ADL training;Therapeutic exercise;Functional Mobility Training;Aquatic Therapy;Neuromuscular education;Manual Therapy;Splinting;Therapeutic activities;Coping strategies training;DME and/or AE instruction;Cognitive remediation/compensation;Electrical Stimulation;Visual/perceptual remediation/compensation;Moist Heat;Passive range of motion;Patient/family education;Ultrasound    Plan start looking at STG'S, bed positioning, lying on right side, continue body on arm motion in sidelying, seated, weight shift left in sitting and standing    OT Home Exercise Plan Initiated supine body on arm with partial rolling - girlfriend not present    Consulted and Agree with Plan of Care Patient           Patient will benefit from skilled therapeutic intervention in order to improve the following deficits and impairments:           Visit Diagnosis: Hemiplegia and hemiparesis following cerebral infarction affecting left non-dominant side (HCC)  Unsteadiness on feet  Muscle weakness (generalized)  Other disturbances of skin sensation  Pain in left arm  Spastic hemiplegia of left nondominant 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 (HCC)  Spastic hemiplegia affecting nondominant side (HCC)    Problem List Patient Active Problem List   Diagnosis Date Noted  . Neuropathic pain 04/09/2020  . Pain   . Spastic hemiparesis (HCC)   . History of hypertension   . Dyslipidemia   . 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 Leukocytosis 01/12/2020  . Cerebrovascular accident (CVA) due to thrombosis of  right middle cerebral artery (HCC)   . Chronic pain syndrome   . Hypokalemia   . Dysphagia, post-stroke   . Acute  ischemic right MCA stroke (HCC) 01/07/2020  . Tobacco dependence   . Marijuana dependence (HCC)   . Alcohol dependence [facility])     Collier Salina, OTR/L 06/05/2020, 6:48 PM  [facility] [address] [place], [place], [address] Phone: [phone]   Fax:  [phone]  Name: Derek [name] MRN: [id number] Date of Birth: 1976/03/04

## 2020-06-05 NOTE — Therapy (Signed)
[facility] [address] [place], [place], [address] Phone: [phone]   Fax:  [phone]  Physical Therapy Treatment  Patient Details  Name: Derek [name] MRN: [id number] Date of Birth: 20-Oct-1976 Referring Provider (PT): Alger Simons, MD   Encounter Date: 06/05/2020   PT End of Session - 06/05/20 1622    Visit Number 9    Number of Visits 17    Date for PT Re-Evaluation 07/30/20   POC for 8 weeks, Cert for 90 days   Authorization Type BCBS    PT Start Time 1616    PT Stop Time 1658    PT Time Calculation (min) 42 min    Equipment Utilized During Treatment Gait belt    Activity Tolerance Patient tolerated treatment well    Behavior During Therapy [facility] for tasks assessed/performed;Flat affect           Past Medical History:  Diagnosis Date  . Acute ischemic right MCA stroke ([place]) 01/07/2020  . Alcohol dependence ([place])   . Back pain   . Marijuana dependence ([place])   . Tobacco dependence     Past Surgical History:  Procedure Laterality Date  . BUBBLE STUDY  01/10/2020   Procedure: BUBBLE STUDY;  Surgeon: Geralynn Rile, MD;  Location: [facility];  Service: Cardiovascular;;  . TEE WITHOUT CARDIOVERSION N/A 01/10/2020   Procedure: TRANSESOPHAGEAL ECHOCARDIOGRAM (TEE);  Surgeon: Geralynn Rile, MD;  Location: [facility];  Service: Cardiovascular;  Laterality: N/A;    There were no vitals filed for this visit.   Subjective Assessment - 06/05/20 1621    Subjective Patient reports has to go to another funeral, his uncle passed away. No falls. Mild pain in the shoulder    Pertinent History Alcohol and Tobacco Use    Limitations Standing;Walking;Sitting    How long can you stand comfortably? 3-5 minutes    Currently in Pain? Yes    Pain Score 4     Pain Location Shoulder    Pain Orientation Left    Pain Descriptors / Indicators Aching    Pain Onset More than a month ago                              [facility] Adult PT Treatment/Exercise - 06/05/20 0001      Transfers   Transfers Sit to Stand;Stand to Sit    Sit to Stand 4: Min guard    Stand to Sit 4: Min guard      Ambulation/Gait   Ambulation/Gait Yes    Ambulation/Gait Assistance 4: Min guard    Ambulation/Gait Assistance Details completed gait training with hemiwalker during today's session. PT providing manual faciliation for improved weight shift to L side and to promote improved step length. Completed 115 ft, 247f. PT providing verbal cues for improved step length on RLE to promote weight shift and acceptance on LLE.     Ambulation Distance (Feet) 115 Feet   x 1, 230 x 1   Assistive device Hemi-walker    Gait Pattern Step-through pattern;Decreased step length - left;Decreased stance time - left;Decreased step length - right;Decreased hip/knee flexion - left;Decreased dorsiflexion - left    Ambulation Surface Level;Indoor    Ramp Other (comment)   CGA   Ramp Details (indicate cue type and reason) practiced ascending/descending ramp x 2 times with hemiwalker. PT providing verbal cues for sequencing. Also provided verbal cues to keep AD turned proper  as patient tend to turn it sideways with ascending ramp. PT providing CGA throughout.       Neuro Re-ed    Neuro Re-ed Details  Standing without UE support, completing step to 4" step and hold for 5 seconds to further promote balance and LLE strength, completed 2 x 10 reps. PT providing manual faciliation at pelvis/hips for weight shift and verbal cues to stand tall for improved posture with completion.       Exercises   Exercises Other Exercises    Other Exercises  Completed seated heel slides on LLE with pillow case under foot for reduced friction, with working on improved flexion/extension with completion. completed 1 x 15 reps, with PT providing Min A at times for improved movement, focus on control throughout.                     PT  Short Term Goals - 06/03/20 1619      PT SHORT TERM GOAL #1   Title Patient will be independent with Initial HEP with caregiver assitance (for BLE strengthening/stretching)    Baseline needed review for proper technique of HEP    Time 4    Period Weeks    Status Partially Met    Target Date 05/29/20      PT SHORT TERM GOAL #2   Title Patient will demonstrate ability to complete transfer from w/c <> mat with LRAD, supervision level to demo improved independence with functional mobility    Baseline min guard with hemiwalker    Time 4    Period Weeks    Status Partially Met    Target Date 05/29/20      PT SHORT TERM GOAL #3   Title Patient will demonstrate ability to ambulate at least 150 ft w/ LRAD and CGA to demonstrate improved functional mobility    Baseline Patient able to ambulate 230 ft CGA ([place]) with Hemiwalker    Time 4    Period Weeks    Status Achieved    Target Date 05/29/20      PT SHORT TERM GOAL #4   Title Patient will improve TUG to </= 45 secs w/ LRAD to demo reduced risk for falls    Baseline 43.65 seconds with hemiwalker on 05/26/20    Time 4    Period Weeks    Status Achieved    Target Date 05/29/20             PT Long Term Goals - 05/01/20 1951      PT LONG TERM GOAL #1   Title Patient will be independent with Final HEP    Time 8    Period Weeks    Status New    Target Date 06/26/20      PT LONG TERM GOAL #2   Title Patient will demonstrate ability to ambulate > 250 ft w/ LRAD and Supv to demo improved community mobility    Time 8    Period Weeks    Status New    Target Date 06/26/20      PT LONG TERM GOAL #3   Title Patient will improve TUG to </= 35 secs w/ LRAD to demo reduced risk for falls    Baseline 58.66 secs w/ hemiwalker    Time 8    Period Weeks    Status New      PT LONG TERM GOAL #4   Title Patient will demo ability to ambulate at gait speed of >/=  2.0 ft/sec to demo improved functional mobility    Time 8    Period  Weeks    Status New    Target Date 06/26/20      PT LONG TERM GOAL #5   Title Patient will demo ability to complete all bed mobility and transfers, Mod I, for improved independence    Time 8    Period Weeks    Status New                 Plan - 06/05/20 1757    Clinical Impression Statement Continued gait training today with focus on improved weight shifting to LLE and step length, PT also educating on proper ascending/descending ramp with AD for improved entry into home. Patient will continue to benefit from skilled PT services to progress toward all goals.    Personal Factors and Comorbidities Comorbidity 2    Comorbidities Chronic Back Pain,HTN, Prior History of Alcohol/Tobacco Use    Examination-Activity Limitations Bed Mobility;Stairs;Stand;Toileting;Transfers;Dressing    Examination-Participation Restrictions Community Activity;Driving;Yard Work;Other   Occupation   Stability/Clinical Decision Making Evolving/Moderate complexity    Rehab Potential Good    PT Frequency 2x / week    PT Duration 8 weeks    PT Treatment/Interventions ADLs/Self Care Home Management;Electrical Stimulation;Aquatic Therapy;Moist Heat;DME Instruction;Gait training;Stair training;Functional mobility training;Therapeutic activities;Therapeutic exercise;Balance training;Neuromuscular re-education;Patient/family education;Orthotic Fit/Training;Wheelchair mobility training;Manual techniques;Passive range of motion    PT Next Visit Plan AFO brace for LLE? Strengthening Exercises for LLE. Gait training. Tall Kneeling, Activites for tone management. Update HEP as needed    Consulted and Agree with Plan of Care Patient           Patient will benefit from skilled therapeutic intervention in order to improve the following deficits and impairments:  Abnormal gait, Decreased balance, Decreased endurance, Decreased mobility, Difficulty walking, Impaired tone, Impaired sensation, Pain, Impaired UE functional use,  Impaired flexibility, Decreased strength, Decreased knowledge of use of DME, Decreased activity tolerance, Decreased coordination, Decreased range of motion, Increased muscle spasms  Visit Diagnosis: Hemiplegia and hemiparesis following cerebral infarction affecting left non-dominant side (HCC)  Unsteadiness on feet  Muscle weakness (generalized)  Other abnormalities of gait and mobility     Problem List Patient Active Problem List   Diagnosis Date Noted  . Neuropathic pain 04/09/2020  . Pain   . Spastic hemiparesis ([place])   . History of hypertension   . Dyslipidemia   . 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 Leukocytosis 01/12/2020  . Cerebrovascular accident (CVA) due to thrombosis of right middle cerebral artery ([place])   . Chronic pain syndrome   . Hypokalemia   . Dysphagia, post-stroke   . Acute ischemic right MCA stroke ([place]) 01/07/2020  . Tobacco dependence   . Marijuana dependence ([place])   . Alcohol dependence ([place])     Jones Bales, PT, DPT 06/05/2020, 5:59 PM  [facility] [address] [place], [place], [address] Phone: [phone]   Fax:  [phone]  Name: Derek [name] MRN: [id number] Date of Birth: 02-03-1976

## 2020-06-09 DIAGNOSIS — I63511 Cerebral infarction due to unspecified occlusion or stenosis of right middle cerebral artery: Secondary | ICD-10-CM | POA: Diagnosis not present

## 2020-06-09 DIAGNOSIS — Z0271 Encounter for disability determination: Secondary | ICD-10-CM

## 2020-06-10 ENCOUNTER — Ambulatory Visit: Payer: BC Managed Care – PPO

## 2020-06-10 ENCOUNTER — Ambulatory Visit: Payer: BC Managed Care – PPO | Admitting: Occupational Therapy

## 2020-06-10 ENCOUNTER — Other Ambulatory Visit: Payer: Self-pay

## 2020-06-10 ENCOUNTER — Encounter: Payer: Self-pay | Admitting: Occupational Therapy

## 2020-06-10 DIAGNOSIS — R2681 Unsteadiness on feet: Secondary | ICD-10-CM | POA: Diagnosis not present

## 2020-06-10 DIAGNOSIS — M6281 Muscle weakness (generalized): Secondary | ICD-10-CM | POA: Diagnosis not present

## 2020-06-10 DIAGNOSIS — M79602 Pain in left arm: Secondary | ICD-10-CM

## 2020-06-10 DIAGNOSIS — I69354 Hemiplegia and hemiparesis following cerebral infarction affecting left non-dominant side: Secondary | ICD-10-CM

## 2020-06-10 DIAGNOSIS — G8110 Spastic hemiplegia affecting unspecified side: Secondary | ICD-10-CM | POA: Diagnosis not present

## 2020-06-10 DIAGNOSIS — R209 Unspecified disturbances of skin sensation: Secondary | ICD-10-CM | POA: Diagnosis not present

## 2020-06-10 DIAGNOSIS — R208 Other disturbances of skin sensation: Secondary | ICD-10-CM

## 2020-06-10 DIAGNOSIS — R2689 Other abnormalities of gait and mobility: Secondary | ICD-10-CM

## 2020-06-10 NOTE — Therapy (Signed)
[facility] [address] [place], [place], [address] Phone: [phone]   Fax:  [phone]  Occupational Therapy Treatment  Patient Details  Name: Derek [name] MRN: [id number] Date of Birth: 1976/10/02 Referring Provider (OT): Dr. Jacklynn Lewis   Encounter Date: 06/10/2020   OT End of Session - 06/10/20 1854    Visit Number 8    Number of Visits 17    Date for OT Re-Evaluation 07/02/20    Authorization Type BC/BS - 60 combined visits for all disciplines    Authorization - Visit Number 8    Authorization - Number of Visits 30    OT Start Time 1700    OT Stop Time 1745    OT Time Calculation (min) 45 min    Activity Tolerance Patient tolerated treatment well    Behavior During Therapy [facility] for tasks assessed/performed           Past Medical History:  Diagnosis Date  . 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 Alcohol dependence (HCC)   . Back pain   . Marijuana dependence (HCC)   . Tobacco dependence     Past Surgical History:  Procedure Laterality Date  . BUBBLE STUDY  01/10/2020   Procedure: BUBBLE STUDY;  Surgeon: Sande Rives, MD;  Location: [facility] ENDOSCOPY;  Service: Cardiovascular;;  . TEE WITHOUT CARDIOVERSION N/A 01/10/2020   Procedure: TRANSESOPHAGEAL ECHOCARDIOGRAM (TEE);  Surgeon: Sande Rives, MD;  Location: [facility] ENDOSCOPY;  Service: Cardiovascular;  Laterality: N/A;    There were no vitals filed for this visit.   Subjective Assessment - 06/10/20 1708    Subjective  No shoulder pain - I took my last pain pill right before I came    Currently in Pain? No/denies    Pain Score 0-No pain                        OT Treatments/Exercises (OP) - 06/10/20 1850      ADLs   ADL Comments Discussed potential option for aquatic therapy to improve coordination and reduce tone overall.  Patient in agreement.        Neurological Re-education Exercises   Other  Exercises 1 Patient without shoulder pain today.  Neuromuscular reeducation to address midline orientation, weight shift forward during transitional movements.  Worked with BUE to reduce tension activation in arms with weight shift through LE's.  Worked on assisted reach patterns in sitting - patient guarded with pattern of shoulder extension with elbow extension - but doing well with forward translation of arm toward target.                    OT Education - 06/10/20 1853    Education Details discussed potential for aquatic therapy    Person(s) Educated Patient;Other (comment)    Methods Explanation    Comprehension Verbalized understanding            OT Short Term Goals - 06/10/20 1857      OT SHORT TERM GOAL #1   Title Independent with HEP for LUE    Status On-going      OT SHORT TERM GOAL #2   Title Pt to be independent with splint wear and care prn    Status On-going      OT SHORT TERM GOAL #3   Title Pt to verbalize understanding with pain management strategies and proper positioning of LUE    Status On-going  OT SHORT TERM GOAL #4   Title Pt to bathe self with min assist seated    Status On-going      OT SHORT TERM GOAL #5   Title Pt to perform UE dressing with min assist and LE dressing with mod assist    Status On-going             OT Long Term Goals - 05/01/20 1728      OT LONG TERM GOAL #1   Title Pt to tolerate 75% PROM LUE with pain 3/10 or under    Time 8    Period Weeks    Status New      OT LONG TERM GOAL #2   Title Pt to use LUE as stabalizer for bilateral tasks    Time 8    Period Weeks    Status New      OT LONG TERM GOAL #3   Title Pt to verbalize understanding with potential A/E needs and task modifications to increase ease with ADLS and simple IADLS    Time 8    Period Weeks    Status New      OT LONG TERM GOAL #4   Title Pt to be mod I level with bathing and UE dressing and min assist for LE dressing    Time 8     Period Weeks    Status New      OT LONG TERM GOAL #5   Title Pt to perform toileting at mod I level    Time 8    Period Weeks    Status New      Long Term Additional Goals   Additional Long Term Goals Yes      OT LONG TERM GOAL #6   Title Pt to perform simple snack prep/sandwich prep from w/c level prn or w/ use of walker and walker tray    Time 8    Period Weeks    Status New                 Plan - 06/10/20 1855    Clinical Impression Statement Patient without report of shoulder pain today after taking prescribed medication. Patient agreeable to aquatic therapy if covered.  Patient scheduled for botox injections next week.    OT Occupational Profile and History Detailed Assessment- Review of Records and additional review of physical, cognitive, psychosocial history related to current functional performance    Occupational performance deficits (Please refer to evaluation for details): ADL's;IADL's;Rest and Sleep;Leisure;Social Participation    Body Structure / Function / Physical Skills ADL;ROM;Dexterity;Edema;Balance;IADL;Body mechanics;Improper spinal/pelvic alignment;Sensation;Mobility;Strength;Muscle spasms;Coordination;Tone;Pain;UE functional use;Decreased knowledge of use of DME;Vision    Rehab Potential Fair    Clinical Decision Making Several treatment options, min-mod task modification necessary    Comorbidities Affecting Occupational Performance: May have comorbidities impacting occupational performance    Modification or Assistance to Complete Evaluation  No modification of tasks or assist necessary to complete eval    OT Frequency 2x / week    OT Duration 8 weeks    OT Treatment/Interventions Self-care/ADL training;Therapeutic exercise;Functional Mobility Training;Aquatic Therapy;Neuromuscular education;Manual Therapy;Splinting;Therapeutic activities;Coping strategies training;DME and/or AE instruction;Cognitive remediation/compensation;Electrical  Stimulation;Visual/perceptual remediation/compensation;Moist Heat;Passive range of motion;Patient/family education;Ultrasound    Plan If approved for aqautics - review requirments - start looking at STG'S, bed positioning, lying on right side, continue body on arm motion in sidelying, seated, weight shift left in sitting and standing    OT Home Exercise Plan Initiated supine body on arm  with partial rolling - girlfriend not present    Consulted and Agree with Plan of Care Patient           Patient will benefit from skilled therapeutic intervention in order to improve the following deficits and impairments:   Body Structure / Function / Physical Skills: ADL, ROM, Dexterity, Edema, Balance, IADL, Body mechanics, Improper spinal/pelvic alignment, Sensation, Mobility, Strength, Muscle spasms, Coordination, Tone, Pain, UE functional use, Decreased knowledge of use of DME, Vision       Visit Diagnosis: Unsteadiness on feet  Hemiplegia and hemiparesis following cerebral infarction affecting left non-dominant side (HCC)  Muscle weakness (generalized)  Other disturbances of skin sensation  Pain in left arm  Spastic hemiplegia of left nondominant 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 [facility])    Problem List Patient Active Problem List   Diagnosis Date Noted  . Neuropathic pain 04/09/2020  . Pain   . Spastic hemiparesis (HCC)   . History of hypertension   . Dyslipidemia   . 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 Leukocytosis 01/12/2020  . Cerebrovascular accident (CVA) due to thrombosis of right middle cerebral artery (HCC)   . Chronic pain syndrome   . Hypokalemia   . Dysphagia, post-stroke   . 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 Tobacco dependence   . Marijuana dependence (HCC)   . Alcohol dependence [facility])     Collier Salina, OTR/L 06/10/2020, 6:58 PM  [facility] [address]  [address] [place], [place], [address] Phone: [phone]   Fax:  [phone]  Name: Derek [name] MRN: [id number] Date of Birth: [birth year]

## 2020-06-10 NOTE — Therapy (Signed)
[facility] [address] [place], [place], [address] Phone: [phone]   Fax:  [phone]  Physical Therapy Treatment  Patient Details  Name: Derek [name] MRN: [id number] Date of Birth: Apr 04, 1976 Referring Provider (PT): Alger Simons, MD   Encounter Date: 06/10/2020   PT End of Session - 06/10/20 1621    Visit Number 10    Number of Visits 17    Date for PT Re-Evaluation 07/30/20   POC for 8 weeks, Cert for 90 days   Authorization Type BCBS    PT Start Time 1616    PT Stop Time 1701    PT Time Calculation (min) 45 min    Equipment Utilized During Treatment Gait belt    Activity Tolerance Patient tolerated treatment well    Behavior During Therapy [facility] for tasks assessed/performed;Flat affect           Past Medical History:  Diagnosis Date  . Acute ischemic right MCA stroke ([place]) 01/07/2020  . Alcohol dependence ([place])   . Back pain   . Marijuana dependence ([place])   . Tobacco dependence     Past Surgical History:  Procedure Laterality Date  . BUBBLE STUDY  01/10/2020   Procedure: BUBBLE STUDY;  Surgeon: Geralynn Rile, MD;  Location: [facility];  Service: Cardiovascular;;  . TEE WITHOUT CARDIOVERSION N/A 01/10/2020   Procedure: TRANSESOPHAGEAL ECHOCARDIOGRAM (TEE);  Surgeon: Geralynn Rile, MD;  Location: [place];  Service: Cardiovascular;  Laterality: N/A;    There were no vitals filed for this visit.   Subjective Assessment - 06/10/20 1620    Subjective Patient reports decent weekend. Reports he is not sleeping well, wants to potentially get a recliner. Patient reports took pain medication prior to coming to therapy session.    Pertinent History Alcohol and Tobacco Use    Limitations Standing;Walking;Sitting    How long can you stand comfortably? 3-5 minutes    Currently in Pain? No/denies    Pain Onset More than a month ago                              [facility] Adult PT Treatment/Exercise - 06/10/20 0001      Transfers   Transfers Sit to Stand;Stand to Sit    Sit to Stand 5: Supervision    Sit to Stand Details Manual facilitation for weight shifting    Stand to Sit 5: Supervision    Stand to Sit Details (indicate cue type and reason) Verbal cues for technique;Verbal cues for safe use of DME/AE    Stand Pivot Transfers 4: Min guard      Ambulation/Gait   Ambulation/Gait Yes    Ambulation/Gait Assistance 4: Min guard    Ambulation/Gait Assistance Details Completed gait training with hemiwalker, PT providing manual faciliation for improved weight shift on LLE as well as verbal cues for improved step length with RLE to promote weight acceptance onto LLE. PT providing faciliation through pelvis. Patient able to ambulate x 345 ft today prior to requiring rest break.     Ambulation Distance (Feet) 345 Feet    Assistive device Hemi-walker    Gait Pattern Step-through pattern;Decreased step length - left;Decreased stance time - left;Decreased step length - right;Decreased hip/knee flexion - left;Decreased dorsiflexion - left    Ambulation Surface Level;Indoor      Self-Care   Self-Care Other Self-Care Comments    Other Self-Care Comments  Educated on importance  of completing HEP and doing AROM at home to help with tone mangement and stiffness, also encouraging paitent to ambualte at least 10 times per day for improved overall mobility.       Therapeutic Activites    Therapeutic Activities --      Neuro Re-ed    Neuro Re-ed Details  Completed standing without UE support, completed forward/backward stepping with RLE x 15 reps. PT providing manual facilitation for weight shift at pelvis with completion. In supine position, PT completed PROM for improved tone mangagement, progressed to completed PNF D1 on LLE, with patient moving against resistance. Educated and updated PROM/AROM exercise to HEP with caregiver/family member assistance for improved  motion. Standing without UE support, completed 10 reps of mini squats with focus on improved knee flexion and return to upright posture.                   PT Education - 06/10/20 1654    Education Details Educated on HEP Update (see patient instructions) and walking more frequently within the home    Person(s) Educated Patient    Methods Explanation;Demonstration;Handout    Comprehension Verbalized understanding;Returned demonstration;Need further instruction            PT Short Term Goals - 06/03/20 1619      PT SHORT TERM GOAL #1   Title Patient will be independent with Initial HEP with caregiver assitance (for BLE strengthening/stretching)    Baseline needed review for proper technique of HEP    Time 4    Period Weeks    Status Partially Met    Target Date 05/29/20      PT SHORT TERM GOAL #2   Title Patient will demonstrate ability to complete transfer from w/c <> mat with LRAD, supervision level to demo improved independence with functional mobility    Baseline min guard with hemiwalker    Time 4    Period Weeks    Status Partially Met    Target Date 05/29/20      PT SHORT TERM GOAL #3   Title Patient will demonstrate ability to ambulate at least 150 ft w/ LRAD and CGA to demonstrate improved functional mobility    Baseline Patient able to ambulate 230 ft CGA ([place]) with Hemiwalker    Time 4    Period Weeks    Status Achieved    Target Date 05/29/20      PT SHORT TERM GOAL #4   Title Patient will improve TUG to </= 45 secs w/ LRAD to demo reduced risk for falls    Baseline 43.65 seconds with hemiwalker on 05/26/20    Time 4    Period Weeks    Status Achieved    Target Date 05/29/20             PT Long Term Goals - 05/01/20 1951      PT LONG TERM GOAL #1   Title Patient will be independent with Final HEP    Time 8    Period Weeks    Status New    Target Date 06/26/20      PT LONG TERM GOAL #2   Title Patient will demonstrate ability to  ambulate > 250 ft w/ LRAD and Supv to demo improved community mobility    Time 8    Period Weeks    Status New    Target Date 06/26/20      PT LONG TERM GOAL #3   Title Patient will  improve TUG to </= 35 secs w/ LRAD to demo reduced risk for falls    Baseline 58.66 secs w/ hemiwalker    Time 8    Period Weeks    Status New      PT LONG TERM GOAL #4   Title Patient will demo ability to ambulate at gait speed of >/= 2.0 ft/sec to demo improved functional mobility    Time 8    Period Weeks    Status New    Target Date 06/26/20      PT LONG TERM GOAL #5   Title Patient will demo ability to complete all bed mobility and transfers, Mod I, for improved independence    Time 8    Period Weeks    Status New                 Plan - 06/10/20 1722    Clinical Impression Statement Today's session focused on continued gait training with patient able to ambualte improved distances today. Rest of session spent of activites to promote AROM and tone management with patient tolerating well. Patietn will continue to benefit from skilled PT services to progress toward all goals.    Personal Factors and Comorbidities Comorbidity 2    Comorbidities Chronic Back Pain,HTN, Prior History of Alcohol/Tobacco Use    Examination-Activity Limitations Bed Mobility;Stairs;Stand;Toileting;Transfers;Dressing    Examination-Participation Restrictions Community Activity;Driving;Yard Work;Other   Occupation   Stability/Clinical Decision Making Evolving/Moderate complexity    Rehab Potential Good    PT Frequency 2x / week    PT Duration 8 weeks    PT Treatment/Interventions ADLs/Self Care Home Management;Electrical Stimulation;Aquatic Therapy;Moist Heat;DME Instruction;Gait training;Stair training;Functional mobility training;Therapeutic activities;Therapeutic exercise;Balance training;Neuromuscular re-education;Patient/family education;Orthotic Fit/Training;Wheelchair mobility training;Manual techniques;Passive  range of motion    PT Next Visit Plan AFO brace for LLE? Strengthening Exercises for LLE. Gait training. Tall Kneeling, Activites for tone management. Update HEP as needed    Consulted and Agree with Plan of Care Patient           Patient will benefit from skilled therapeutic intervention in order to improve the following deficits and impairments:  Abnormal gait, Decreased balance, Decreased endurance, Decreased mobility, Difficulty walking, Impaired tone, Impaired sensation, Pain, Impaired UE functional use, Impaired flexibility, Decreased strength, Decreased knowledge of use of DME, Decreased activity tolerance, Decreased coordination, Decreased range of motion, Increased muscle spasms  Visit Diagnosis: Hemiplegia and hemiparesis following cerebral infarction affecting left non-dominant side (HCC)  Unsteadiness on feet  Muscle weakness (generalized)  Other abnormalities of gait and mobility     Problem List Patient Active Problem List   Diagnosis Date Noted  . Neuropathic pain 04/09/2020  . Pain   . Spastic hemiparesis ([place])   . History of hypertension   . Dyslipidemia   . Right middle cerebral artery stroke ([place]) 01/15/2020  . Leukocytosis 01/12/2020  . Cerebrovascular accident (CVA) due to thrombosis of right middle cerebral artery ([place])   . Chronic pain syndrome   . Hypokalemia   . Dysphagia, post-stroke   . Acute ischemic right MCA stroke ([place]) 01/07/2020  . Tobacco dependence   . Marijuana dependence ([place])   . Alcohol dependence ([place])     Jones Bales, PT, DPT 06/10/2020, 5:24 PM  [facility] [address] [place], [place], [address] Phone: [phone]   Fax:  [phone]  Name: Derek [name] MRN: [id number] Date of Birth: 01-07-1976

## 2020-06-10 NOTE — Patient Instructions (Signed)
Access Code: [email or web address] URL: [email or web address] Date: 06/10/2020 Prepared by: Jethro Bastos  Exercises Supine Bridge - 1 x daily - 5 x weekly - 3 sets - 5 reps Bent Knee Fallouts - 1 x daily - 5 x weekly - 3 sets - 5 reps Sit to Stand with Armchair - 1 x daily - 5 x weekly - 3 sets - 5 reps Seated Heel Slide - 1 x daily - 5 x weekly - 2 sets - 5 reps Supine Hip and Knee Flexion PROM with Caregiver - 1 x daily - 7 x weekly - 3 sets - 10 reps

## 2020-06-12 ENCOUNTER — Encounter: Payer: Self-pay | Admitting: Physical Therapy

## 2020-06-12 ENCOUNTER — Other Ambulatory Visit: Payer: Self-pay

## 2020-06-12 ENCOUNTER — Encounter: Payer: Self-pay | Admitting: Occupational Therapy

## 2020-06-12 ENCOUNTER — Ambulatory Visit: Payer: BC Managed Care – PPO | Admitting: Physical Therapy

## 2020-06-12 ENCOUNTER — Ambulatory Visit: Payer: BC Managed Care – PPO | Admitting: Occupational Therapy

## 2020-06-12 DIAGNOSIS — R208 Other disturbances of skin sensation: Secondary | ICD-10-CM

## 2020-06-12 DIAGNOSIS — I69354 Hemiplegia and hemiparesis following cerebral infarction affecting left non-dominant side: Secondary | ICD-10-CM | POA: Diagnosis not present

## 2020-06-12 DIAGNOSIS — R209 Unspecified disturbances of skin sensation: Secondary | ICD-10-CM | POA: Diagnosis not present

## 2020-06-12 DIAGNOSIS — G8110 Spastic hemiplegia affecting unspecified side: Secondary | ICD-10-CM | POA: Diagnosis not present

## 2020-06-12 DIAGNOSIS — M79602 Pain in left arm: Secondary | ICD-10-CM | POA: Diagnosis not present

## 2020-06-12 DIAGNOSIS — M6281 Muscle weakness (generalized): Secondary | ICD-10-CM | POA: Diagnosis not present

## 2020-06-12 DIAGNOSIS — R2689 Other abnormalities of gait and mobility: Secondary | ICD-10-CM

## 2020-06-12 DIAGNOSIS — R2681 Unsteadiness on feet: Secondary | ICD-10-CM

## 2020-06-12 NOTE — Therapy (Signed)
[facility] [address] [place], [place], [address] Phone: [phone]   Fax:  [phone]  Occupational Therapy Treatment  Patient Details  Name: Derek [name] MRN: [id number] Date of Birth: 07-27-1976 Referring Provider (OT): Dr. Jacklynn Lewis   Encounter Date: 06/12/2020   OT End of Session - 06/12/20 1354    Visit Number 9    Number of Visits 17    Date for OT Re-Evaluation 07/02/20    Authorization Type BC/BS - 60 combined visits for all disciplines    Authorization - Visit Number 9    Authorization - Number of Visits 30    OT Start Time 1145    OT Stop Time 1230    OT Time Calculation (min) 45 min    Activity Tolerance Patient tolerated treatment well    Behavior During Therapy [facility] for tasks assessed/performed;Flat affect           Past Medical History:  Diagnosis Date  . 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 Alcohol dependence (HCC)   . Back pain   . Marijuana dependence (HCC)   . Tobacco dependence     Past Surgical History:  Procedure Laterality Date  . BUBBLE STUDY  01/10/2020   Procedure: BUBBLE STUDY;  Surgeon: Sande Rives, MD;  Location: [facility] ENDOSCOPY;  Service: Cardiovascular;;  . TEE WITHOUT CARDIOVERSION N/A 01/10/2020   Procedure: TRANSESOPHAGEAL ECHOCARDIOGRAM (TEE);  Surgeon: Sande Rives, MD;  Location: [facility] ENDOSCOPY;  Service: Cardiovascular;  Laterality: N/A;    There were no vitals filed for this visit.   Subjective Assessment - 06/12/20 1347    Subjective  Patient indicates pain in shoulder 3/10 - out of pain pills    Currently in Pain? Yes    Pain Score 3     Pain Location Shoulder    Pain Orientation Left    Pain Descriptors / Indicators Aching    Pain Type Chronic pain    Pain Onset More than a month ago    Pain Frequency Constant    Aggravating Factors  fast movement    Pain Relieving Factors rest, medication                         OT Treatments/Exercises (OP) - 06/12/20 1349      ADLs   Toileting Patient ambulated to bathroom with min assist to facilitate equal weight shift left/right and equal step length left and right, no use of hemi walker.  Patient able to manage clothing up and down, needing cueing only to alter his position on toilet seat to void.  Patient able to complete hygiene without assistance, and even reach to floor in standing to pull up clothing.  Patient needed intermittent assistance only to adjust clothing over left hip.  Patient is not dressing himself at home - despite encouragement to do so.  Patient's homework this weekend was to attempt to don underwear, elastic waist shorts and tshirt - and report back amount of time this took.      ADL Comments Patient to start aquatic therapy 8/23.  Patient issued written instructions - see patient instructions.                    OT Education - 06/12/20 1353    Education Details recommendations and requirements for aquatic therapy.    Person(s) Educated Patient    Methods Explanation;Handout    Comprehension Verbalized understanding  OT Short Term Goals - 06/12/20 1357      OT SHORT TERM GOAL #1   Title Independent with HEP for LUE    Time 4    Period Weeks    Status On-going      OT SHORT TERM GOAL #2   Title Pt to be independent with splint wear and care prn    Time 4    Period Weeks    Status On-going      OT SHORT TERM GOAL #3   Title Pt to verbalize understanding with pain management strategies and proper positioning of LUE    Time 4    Period Weeks    Status On-going      OT SHORT TERM GOAL #4   Title Pt to bathe self with min assist seated    Baseline mod assist seated    Time 4    Period Weeks    Status On-going      OT SHORT TERM GOAL #5   Title Pt to perform UE dressing with min assist and LE dressing with mod assist    Baseline mod assist/max assist    Time 4    Period  Weeks    Status On-going             OT Long Term Goals - 06/12/20 1358      OT LONG TERM GOAL #1   Title Pt to tolerate 75% PROM LUE with pain 3/10 or under    Time 8    Period Weeks    Status On-going      OT LONG TERM GOAL #2   Title Pt to use LUE as stabalizer for bilateral tasks    Time 8    Status On-going      OT LONG TERM GOAL #3   Title Pt to verbalize understanding with potential A/E needs and task modifications to increase ease with ADLS and simple IADLS    Period Weeks    Status On-going      OT LONG TERM GOAL #4   Title Pt to be mod I level with bathing and UE dressing and min assist for LE dressing    Time 8    Period Weeks    Status On-going      OT LONG TERM GOAL #5   Title Pt to perform toileting at mod I level    Time 8    Period Weeks    Status On-going      OT LONG TERM GOAL #6   Title Pt to perform simple snack prep/sandwich prep from w/c level prn or w/ use of walker and walker tray    Time 8    Period Weeks                 Plan - 06/12/20 1354    Clinical Impression Statement Patient encouraged to speak with MD regarding pain medication refill.  Patient has had consistent shoulder pain, until last session when he took medication before therapy - and was much better able to participate.    OT Occupational Profile and History Detailed Assessment- Review of Records and additional review of physical, cognitive, psychosocial history related to current functional performance    Occupational performance deficits (Please refer to evaluation for details): ADL's;IADL's;Rest and Sleep;Leisure;Social Participation    Body Structure / Function / Physical Skills ADL;ROM;Dexterity;Edema;Balance;IADL;Body mechanics;Improper spinal/pelvic alignment;Sensation;Mobility;Strength;Muscle spasms;Coordination;Tone;Pain;UE functional use;Decreased knowledge of use of DME;Vision    Rehab Potential Fair  Clinical Decision Making Several treatment options,  min-mod task modification necessary    Comorbidities Affecting Occupational Performance: May have comorbidities impacting occupational performance    Modification or Assistance to Complete Evaluation  No modification of tasks or assist necessary to complete eval    OT Frequency 2x / week    OT Duration 8 weeks    OT Treatment/Interventions Self-care/ADL training;Therapeutic exercise;Functional Mobility Training;Aquatic Therapy;Neuromuscular education;Manual Therapy;Splinting;Therapeutic activities;Coping strategies training;DME and/or AE instruction;Cognitive remediation/compensation;Electrical Stimulation;Visual/perceptual remediation/compensation;Moist Heat;Passive range of motion;Patient/family education;Ultrasound    Plan Ask if he dressed himself this weekend (underwear/shorts and tshirt) and time it took, NMR LUE, Please remind him (and family member) of pool appt 8/23 at 345.    OT Home Exercise Plan Initiated supine body on arm with partial rolling - girlfriend not present           Patient will benefit from skilled therapeutic intervention in order to improve the following deficits and impairments:   Body Structure / Function / Physical Skills: ADL, ROM, Dexterity, Edema, Balance, IADL, Body mechanics, Improper spinal/pelvic alignment, Sensation, Mobility, Strength, Muscle spasms, Coordination, Tone, Pain, UE functional use, Decreased knowledge of use of DME, Vision       Visit Diagnosis: Hemiplegia and hemiparesis following cerebral infarction affecting left non-dominant side (HCC)  Unsteadiness on feet  Muscle weakness (generalized)  Pain in left arm  Spastic 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 Neuropathic pain 04/09/2020  . Pain   . Spastic hemiparesis (HCC)   . History of hypertension   . Dyslipidemia   . 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 Leukocytosis 01/12/2020  . Cerebrovascular accident (CVA) due to thrombosis of right middle cerebral artery (HCC)   . Chronic pain syndrome   . Hypokalemia   . Dysphagia, post-stroke   . 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 Tobacco dependence   . Marijuana dependence (HCC)   . Alcohol dependence (HCC)     Collier Salina, OTR/L 06/12/2020, 2:01 PM  [facility] [address] [place], [place], [address] Phone: [phone]   Fax:  [phone]  Name: Derek [name] MRN: [id number] Date of Birth: 06/10/1976

## 2020-06-12 NOTE — Therapy (Signed)
[facility] [address] [place], [place], [address] Phone: [phone]   Fax:  [phone]  Physical Therapy Treatment  Patient Details  Name: Derek [name] MRN: [id number] Date of Birth: 1976/03/26 Referring Provider (PT): Alger Simons, MD   Encounter Date: 06/12/2020   PT End of Session - 06/12/20 1157    Visit Number 11    Number of Visits 17    Date for PT Re-Evaluation 07/30/20   POC for 8 weeks, Cert for 90 days   Authorization Type BCBS    PT Start Time 1104    PT Stop Time 1145    PT Time Calculation (min) 41 min    Equipment Utilized During Treatment Gait belt    Activity Tolerance Patient tolerated treatment well    Behavior During Therapy [facility] for tasks assessed/performed;Flat affect           Past Medical History:  Diagnosis Date  . Acute ischemic right MCA stroke ([place]) 01/07/2020  . Alcohol dependence ([place])   . Back pain   . Marijuana dependence ([place])   . Tobacco dependence     Past Surgical History:  Procedure Laterality Date  . BUBBLE STUDY  01/10/2020   Procedure: BUBBLE STUDY;  Surgeon: Geralynn Rile, MD;  Location: [place];  Service: Cardiovascular;;  . TEE WITHOUT CARDIOVERSION N/A 01/10/2020   Procedure: TRANSESOPHAGEAL ECHOCARDIOGRAM (TEE);  Surgeon: Geralynn Rile, MD;  Location: [place];  Service: Cardiovascular;  Laterality: N/A;    There were no vitals filed for this visit.   Subjective Assessment - 06/12/20 1107    Subjective Doing good - no changes. Has been doing more walking at home.    Pertinent History Alcohol and Tobacco Use    Limitations Standing;Walking;Sitting    How long can you stand comfortably? 3-5 minutes    Currently in Pain? Yes    Pain Score 3     Pain Location Shoulder    Pain Orientation Left    Pain Descriptors / Indicators Aching    Pain Type Chronic pain    Pain Onset More than a month ago    Aggravating Factors  movement     Pain Relieving Factors after pain medication                             [facility] Adult PT Treatment/Exercise - 06/12/20 0001      Transfers   Transfers Sit to Stand;Stand to Sit    Sit to Stand 5: Supervision;4: Min guard;4: Min assist    Five time sit to stand comments  with use of mirror in front for visual cue - sit <> stands from slightly elevated mat table with no UE support, and then focus on weight shifting to L in standing and then eccentric control when lowering with no UE support    Stand to Sit 5: Supervision;4: Min guard;4: Min assist    Comments x6 reps sit <> stands from elevated mat table with RLE on floor bubble for incr weight shift/weight bearing to L, need of RUE support to stand and holding onto chair for balance, then letting go intermittently for balance, focus on slow eccentric control down to mat, min A at times for balance, takes incr time when performing      Ambulation/Gait   Ambulation/Gait Yes    Ambulation/Gait Assistance 4: Min guard    Ambulation/Gait Assistance Details PT providing manual facilitation for  weight shifting to LLE for improved step length with RLE    Ambulation Distance (Feet) 115 Feet    Assistive device Hemi-walker    Gait Pattern Step-through pattern;Decreased step length - left;Decreased stance time - left;Decreased step length - right;Decreased hip/knee flexion - left;Decreased dorsiflexion - left    Ambulation Surface Level;Indoor      Neuro Re-ed    Neuro Re-ed Details  Completed standing without UE support, completed forward/backward stepping with RLE x 15 reps - with therapist providing min A for weight shifting to L. standing with single UE support on chair for balance: weight shifting to L and then tapping R toe to 4" step, verbal and manual cues for L quad activation and weight shift       Exercises   Exercises Other Exercises    Other Exercises  NuStep with BLE only x5 minutes at gear 1 for improved ROM and to  help decr tone, therapist providing min A at LLE for full ROM as pt with difficulty initiating the movement at times                    PT Short Term Goals - 06/03/20 1619      PT SHORT TERM GOAL #1   Title Patient will be independent with Initial HEP with caregiver assitance (for BLE strengthening/stretching)    Baseline needed review for proper technique of HEP    Time 4    Period Weeks    Status Partially Met    Target Date 05/29/20      PT SHORT TERM GOAL #2   Title Patient will demonstrate ability to complete transfer from w/c <> mat with LRAD, supervision level to demo improved independence with functional mobility    Baseline min guard with hemiwalker    Time 4    Period Weeks    Status Partially Met    Target Date 05/29/20      PT SHORT TERM GOAL #3   Title Patient will demonstrate ability to ambulate at least 150 ft w/ LRAD and CGA to demonstrate improved functional mobility    Baseline Patient able to ambulate 230 ft CGA (Min Guard) with Hemiwalker    Time 4    Period Weeks    Status Achieved    Target Date 05/29/20      PT SHORT TERM GOAL #4   Title Patient will improve TUG to </= 45 secs w/ LRAD to demo reduced risk for falls    Baseline 43.65 seconds with hemiwalker on 05/26/20    Time 4    Period Weeks    Status Achieved    Target Date 05/29/20             PT Long Term Goals - 05/01/20 1951      PT LONG TERM GOAL #1   Title Patient will be independent with Final HEP    Time 8    Period Weeks    Status New    Target Date 06/26/20      PT LONG TERM GOAL #2   Title Patient will demonstrate ability to ambulate > 250 ft w/ LRAD and Supv to demo improved community mobility    Time 8    Period Weeks    Status New    Target Date 06/26/20      PT LONG TERM GOAL #3   Title Patient will improve TUG to </= 35 secs w/ LRAD to demo reduced risk for falls  Baseline 58.66 secs w/ hemiwalker    Time 8    Period Weeks    Status New      PT LONG  TERM GOAL #4   Title Patient will demo ability to ambulate at gait speed of >/= 2.0 ft/sec to demo improved functional mobility    Time 8    Period Weeks    Status New    Target Date 06/26/20      PT LONG TERM GOAL #5   Title Patient will demo ability to complete all bed mobility and transfers, Mod I, for improved independence    Time 8    Period Weeks    Status New                 Plan - 06/12/20 1158    Clinical Impression Statement Today's skilled session continued to focus on LLE ROM to decr tone and weight shifting/strengthening exercises for LLE. Pt responded well to visual cue from mirror for weight shifting over to L for sit <> stands. Needing min A for balance with no UE support. Will continue to progress towards LTGs.    Personal Factors and Comorbidities Comorbidity 2    Comorbidities Chronic Back Pain,HTN, Prior History of Alcohol/Tobacco Use    Examination-Activity Limitations Bed Mobility;Stairs;Stand;Toileting;Transfers;Dressing    Examination-Participation Restrictions Community Activity;Driving;Yard Work;Other   Occupation   Stability/Clinical Decision Making Evolving/Moderate complexity    Rehab Potential Good    PT Frequency 2x / week    PT Duration 8 weeks    PT Treatment/Interventions ADLs/Self Care Home Management;Electrical Stimulation;Aquatic Therapy;Moist Heat;DME Instruction;Gait training;Stair training;Functional mobility training;Therapeutic activities;Therapeutic exercise;Balance training;Neuromuscular re-education;Patient/family education;Orthotic Fit/Training;Wheelchair mobility training;Manual techniques;Passive range of motion    PT Next Visit Plan AFO brace for LLE? Strengthening Exercises for LLE. Gait training. Tall Kneeling, Activites for tone management. Update HEP as needed    Consulted and Agree with Plan of Care Patient           Patient will benefit from skilled therapeutic intervention in order to improve the following deficits and  impairments:  Abnormal gait, Decreased balance, Decreased endurance, Decreased mobility, Difficulty walking, Impaired tone, Impaired sensation, Pain, Impaired UE functional use, Impaired flexibility, Decreased strength, Decreased knowledge of use of DME, Decreased activity tolerance, Decreased coordination, Decreased range of motion, Increased muscle spasms  Visit Diagnosis: Muscle weakness (generalized)  Unsteadiness on feet  Other abnormalities of gait and mobility  Hemiplegia and hemiparesis following cerebral infarction affecting left non-dominant side [facility])     Problem List Patient Active Problem List   Diagnosis Date Noted  . Neuropathic pain 04/09/2020  . Pain   . Spastic hemiparesis ([place])   . History of hypertension   . Dyslipidemia   . Right middle cerebral artery stroke ([place]) 01/15/2020  . Leukocytosis 01/12/2020  . Cerebrovascular accident (CVA) due to thrombosis of right middle cerebral artery ([place])   . Chronic pain syndrome   . Hypokalemia   . Dysphagia, post-stroke   . Acute ischemic right MCA stroke ([place]) 01/07/2020  . Tobacco dependence   . Marijuana dependence ([place])   . Alcohol dependence ([place])     Arliss Journey, PT, DPT  06/12/2020, 12:09 PM  [facility] [address] [place], [place], [address] Phone: [phone]   Fax:  [phone]  Name: Derek [name] MRN: [id number] Date of Birth: 12/31/1975

## 2020-06-12 NOTE — Patient Instructions (Addendum)
°  Aquatic Therapy: What to Expect! ° °Where:  °[facility] °[address] °[place], [place]  [address] °[phone] ° °NOTE:  You will receive an automated phone message reminding you of your appointment and it will say the appointment is at the Rehab Center on [address]  We are working to fix this- just know that you will meet us at the pool! ° °How to Prepare: °• Please make sure you drink 8 ounces of water about one hour prior to your pool session °• A caregiver MUST attend the entire session with the patient.  The caregiver will be responsible for assisting with dressing as well as any toileting needs.  If the patient will be doing a home program this should likely be the person who will assist as well.  °• Patients must wear either their street shoes or pool shoes until they are ready to enter the pool with the therapist.  Patients must also wear either street shoes or pool shoes once exiting the pool to walk to the locker room.  This will helps us prevent slips and falls.  °• Please arrive 15 minutes early to prepare for your pool therapy session °• Sign in at the front desk on the clipboard marked for [place] °• You may use the locker rooms on your right and then enter directly into the recreation pool (NOT the competition pool) °• Please make sure to attend to any toileting needs prior to entering the pool °• Please be dressed in your swim suit and on the pool deck at least 5 minutes before your appointment °• Once on the pool deck your therapist will ask you to sign the Patient  Consent and Assignment of Benefits form °• Your therapist may take your blood pressure prior to, during and after your session if indicated ° °About the pool  and parking: °1. Entering the pool °Your therapist will assist you; there are multiple ways to enter including stairs with railings, a walk in ramp, a roll in chair and a mechanical lift. Your therapist will determine the most appropriate way for  you. °2. Water temperature is usually between 86-87 degrees °3. There may be other swimmers in the pool at the same time °4. Parking is free: if you arrive and there is a parking attendant please inform them you are there for therapy and do not pay to park. Handicapped parking is located at the front entrance. ° °Contact Info:     Appointments: °[facility]  All sessions are 45 minutes   °[address]  [address]     Please call the [facility] if   °, [place]   [address]    you need to cancel or reschedule an appointment.  °[phone]     ° ° ° ° °

## 2020-06-13 ENCOUNTER — Telehealth: Payer: Self-pay | Admitting: *Deleted

## 2020-06-13 DIAGNOSIS — G8110 Spastic hemiplegia affecting unspecified side: Secondary | ICD-10-CM

## 2020-06-13 DIAGNOSIS — M792 Neuralgia and neuritis, unspecified: Secondary | ICD-10-CM

## 2020-06-13 MED ORDER — OXYCODONE HCL 5 MG PO TABS: 5.0000 mg | ORAL_TABLET | Freq: Every day | ORAL | 0 refills | Status: DC | PRN

## 2020-06-13 NOTE — Telephone Encounter (Signed)
A call came in for Derek [name] by a male caller requesting a refill on his oxycodone. Per the PMP the last refill was 04/09/20 #30.  HIs next appt is 06/18/20 for Botox.

## 2020-06-13 NOTE — Telephone Encounter (Signed)
Oxycodone refilled.

## 2020-06-16 ENCOUNTER — Other Ambulatory Visit: Payer: Self-pay

## 2020-06-16 NOTE — Telephone Encounter (Signed)
Notified. 

## 2020-06-16 NOTE — Telephone Encounter (Signed)
Ptn  called requesting oxy IR refill 08.16.21-8:18am

## 2020-06-16 NOTE — Telephone Encounter (Signed)
This has been done.

## 2020-06-18 ENCOUNTER — Encounter: Payer: Self-pay | Admitting: Physical Medicine & Rehabilitation

## 2020-06-18 ENCOUNTER — Ambulatory Visit: Payer: BC Managed Care – PPO | Admitting: Occupational Therapy

## 2020-06-18 ENCOUNTER — Other Ambulatory Visit: Payer: Self-pay

## 2020-06-18 ENCOUNTER — Ambulatory Visit: Payer: BC Managed Care – PPO

## 2020-06-18 ENCOUNTER — Encounter: Payer: BC Managed Care – PPO | Attending: Registered Nurse | Admitting: Physical Medicine & Rehabilitation

## 2020-06-18 VITALS — BP 120/84 | HR 70 | Temp 98.4°F | Ht 71.0 in | Wt 220.0 lb

## 2020-06-18 DIAGNOSIS — I63511 Cerebral infarction due to unspecified occlusion or stenosis of right middle cerebral artery: Secondary | ICD-10-CM | POA: Diagnosis not present

## 2020-06-18 DIAGNOSIS — F1220 Cannabis dependence, uncomplicated: Secondary | ICD-10-CM | POA: Insufficient documentation

## 2020-06-18 DIAGNOSIS — I69354 Hemiplegia and hemiparesis following cerebral infarction affecting left non-dominant side: Secondary | ICD-10-CM

## 2020-06-18 DIAGNOSIS — Z8679 Personal history of other diseases of the circulatory system: Secondary | ICD-10-CM | POA: Insufficient documentation

## 2020-06-18 DIAGNOSIS — R209 Unspecified disturbances of skin sensation: Secondary | ICD-10-CM | POA: Diagnosis not present

## 2020-06-18 DIAGNOSIS — F17200 Nicotine dependence, unspecified, uncomplicated: Secondary | ICD-10-CM | POA: Diagnosis not present

## 2020-06-18 DIAGNOSIS — M792 Neuralgia and neuritis, unspecified: Secondary | ICD-10-CM | POA: Diagnosis not present

## 2020-06-18 DIAGNOSIS — R2681 Unsteadiness on feet: Secondary | ICD-10-CM | POA: Diagnosis not present

## 2020-06-18 DIAGNOSIS — E785 Hyperlipidemia, unspecified: Secondary | ICD-10-CM | POA: Diagnosis not present

## 2020-06-18 DIAGNOSIS — M6281 Muscle weakness (generalized): Secondary | ICD-10-CM | POA: Diagnosis not present

## 2020-06-18 DIAGNOSIS — M79602 Pain in left arm: Secondary | ICD-10-CM

## 2020-06-18 DIAGNOSIS — G8110 Spastic hemiplegia affecting unspecified side: Secondary | ICD-10-CM

## 2020-06-18 DIAGNOSIS — R2689 Other abnormalities of gait and mobility: Secondary | ICD-10-CM

## 2020-06-18 NOTE — Therapy (Signed)
[facility] [address] [place], [place], [address] Phone: [phone]   Fax:  [phone]  Physical Therapy Treatment  Patient Details  Name: Derek [name] MRN: [id number] Date of Birth: [birth year] Referring Provider (PT): Alger Simons, MD   Encounter Date: 06/18/2020   PT End of Session - 06/18/20 1407    Visit Number 12    Number of Visits 17    Date for PT Re-Evaluation 07/30/20   POC for 8 weeks, Cert for 90 days   Authorization Type BCBS    PT Start Time 1402    PT Stop Time 1445    PT Time Calculation (min) 43 min    Equipment Utilized During Treatment Gait belt    Activity Tolerance Patient tolerated treatment well    Behavior During Therapy [facility] for tasks assessed/performed;Flat affect           Past Medical History:  Diagnosis Date  . Acute ischemic right MCA stroke ([place]) 01/07/2020  . Alcohol dependence ([place])   . Back pain   . Marijuana dependence ([place])   . Tobacco dependence     Past Surgical History:  Procedure Laterality Date  . BUBBLE STUDY  01/10/2020   Procedure: BUBBLE STUDY;  Surgeon: Geralynn Rile, MD;  Location: [facility];  Service: Cardiovascular;;  . TEE WITHOUT CARDIOVERSION N/A 01/10/2020   Procedure: TRANSESOPHAGEAL ECHOCARDIOGRAM (TEE);  Surgeon: Geralynn Rile, MD;  Location: [facility];  Service: Cardiovascular;  Laterality: N/A;    There were no vitals filed for this visit.   Subjective Assessment - 06/18/20 1405    Subjective Recieved botox shots in the arm this morning. No other changes to report. Having some mild pain in the shoulder today.    Pertinent History Alcohol and Tobacco Use    Limitations Standing;Walking;Sitting    How long can you stand comfortably? 3-5 minutes    Currently in Pain? Yes    Pain Score 3     Pain Location Shoulder    Pain Orientation Left    Pain Descriptors / Indicators Aching    Pain Type Chronic pain    Pain  Onset More than a month ago                             [facility] Adult PT Treatment/Exercise - 06/18/20 0001      Transfers   Transfers Sit to Stand;Stand to Sit    Sit to Stand 5: Supervision    Stand to Sit 5: Supervision      Ambulation/Gait   Ambulation/Gait Yes    Ambulation/Gait Assistance 4: Min guard    Ambulation/Gait Assistance Details PT providing manual faciliation for weight shift onto LLE. Also provided verbal cues for improved step length with RLE.     Ambulation Distance (Feet) 230 Feet    Assistive device Hemi-walker    Gait Pattern Step-through pattern;Decreased step length - left;Decreased stance time - left;Decreased step length - right;Decreased hip/knee flexion - left;Decreased dorsiflexion - left    Ambulation Surface Level;Indoor      Self-Care   Self-Care Other Self-Care Comments    Other Self-Care Comments  Patient unaware of when appointments where scheduled for aquatic. PT spending time printing off updated schedule and educating on this and ensuring patient reports to [facility] for those specific scheduled appts.       Neuro Re-ed    Neuro Re-ed Details  Completed standing  without UE support, including reaching for small cones on table with RUE and bring across body to hand to PT to promote weight shift onto LLE x 12 cones. Then positioned cones anteriorly to patient right and worked on forward and lateral weight shifting x 12 cones. CGA as needed, PT providing verbal/tactile cues for weight shift. Patietn demonstrates hesistancy to shift weight onto L side.       Exercises   Exercises Other Exercises    Other Exercises  On the SciFit with BLE's and RUE, completed on resistance 1.0 x 5 minutes with focus on improved reciproca movement to help reduce tone. PT providing intermittent Min A to LLE for placement and providing verbal cues to maintain pace. Patient overall tolerating well.                     PT Short Term Goals -  06/03/20 1619      PT SHORT TERM GOAL #1   Title Patient will be independent with Initial HEP with caregiver assitance (for BLE strengthening/stretching)    Baseline needed review for proper technique of HEP    Time 4    Period Weeks    Status Partially Met    Target Date 05/29/20      PT SHORT TERM GOAL #2   Title Patient will demonstrate ability to complete transfer from w/c <> mat with LRAD, supervision level to demo improved independence with functional mobility    Baseline min guard with hemiwalker    Time 4    Period Weeks    Status Partially Met    Target Date 05/29/20      PT SHORT TERM GOAL #3   Title Patient will demonstrate ability to ambulate at least 150 ft w/ LRAD and CGA to demonstrate improved functional mobility    Baseline Patient able to ambulate 230 ft CGA ([place]) with Hemiwalker    Time 4    Period Weeks    Status Achieved    Target Date 05/29/20      PT SHORT TERM GOAL #4   Title Patient will improve TUG to </= 45 secs w/ LRAD to demo reduced risk for falls    Baseline 43.65 seconds with hemiwalker on 05/26/20    Time 4    Period Weeks    Status Achieved    Target Date 05/29/20             PT Long Term Goals - 05/01/20 1951      PT LONG TERM GOAL #1   Title Patient will be independent with Final HEP    Time 8    Period Weeks    Status New    Target Date 06/26/20      PT LONG TERM GOAL #2   Title Patient will demonstrate ability to ambulate > 250 ft w/ LRAD and Supv to demo improved community mobility    Time 8    Period Weeks    Status New    Target Date 06/26/20      PT LONG TERM GOAL #3   Title Patient will improve TUG to </= 35 secs w/ LRAD to demo reduced risk for falls    Baseline 58.66 secs w/ hemiwalker    Time 8    Period Weeks    Status New      PT LONG TERM GOAL #4   Title Patient will demo ability to ambulate at gait speed of >/= 2.0 ft/sec to demo  improved functional mobility    Time 8    Period Weeks    Status New     Target Date 06/26/20      PT LONG TERM GOAL #5   Title Patient will demo ability to complete all bed mobility and transfers, Mod I, for improved independence    Time 8    Period Weeks    Status New                 Plan - 06/18/20 1457    Clinical Impression Statement Patient recieved Botox today, however none in the LLE. Continued session focused on improved weight shift to LLE with activites and during ambulation, and worked on exercises to further decrease tone in the LLE. Will continue to progress toward all goals.    Personal Factors and Comorbidities Comorbidity 2    Comorbidities Chronic Back Pain,HTN, Prior History of Alcohol/Tobacco Use    Examination-Activity Limitations Bed Mobility;Stairs;Stand;Toileting;Transfers;Dressing    Examination-Participation Restrictions Community Activity;Driving;Yard Work;Other   Occupation   Stability/Clinical Decision Making Evolving/Moderate complexity    Rehab Potential Good    PT Frequency 2x / week    PT Duration 8 weeks    PT Treatment/Interventions ADLs/Self Care Home Management;Electrical Stimulation;Aquatic Therapy;Moist Heat;DME Instruction;Gait training;Stair training;Functional mobility training;Therapeutic activities;Therapeutic exercise;Balance training;Neuromuscular re-education;Patient/family education;Orthotic Fit/Training;Wheelchair mobility training;Manual techniques;Passive range of motion    PT Next Visit Plan AFO brace for LLE? Strengthening Exercises for LLE. Gait training. Tall Kneeling, Activites for tone management. Update HEP as needed    Consulted and Agree with Plan of Care Patient           Patient will benefit from skilled therapeutic intervention in order to improve the following deficits and impairments:  Abnormal gait, Decreased balance, Decreased endurance, Decreased mobility, Difficulty walking, Impaired tone, Impaired sensation, Pain, Impaired UE functional use, Impaired flexibility, Decreased  strength, Decreased knowledge of use of DME, Decreased activity tolerance, Decreased coordination, Decreased range of motion, Increased muscle spasms  Visit Diagnosis: Hemiplegia and hemiparesis following cerebral infarction affecting left non-dominant side (HCC)  Muscle weakness (generalized)  Unsteadiness on feet  Other abnormalities of gait and mobility     Problem List Patient Active Problem List   Diagnosis Date Noted  . Neuropathic pain 04/09/2020  . Pain   . Spastic hemiparesis ([place])   . History of hypertension   . Dyslipidemia   . 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 Leukocytosis 01/12/2020  . Cerebrovascular accident (CVA) due to thrombosis of right middle cerebral artery ([place])   . Chronic pain syndrome   . Hypokalemia   . Dysphagia, post-stroke   . Acute ischemic right MCA stroke ([place]) 01/07/2020  . Tobacco dependence   . Marijuana dependence ([place])   . Alcohol dependence ([place])     Jones Bales, PT, DPT 06/18/2020, 3:00 PM  [facility] [address] [place], [place], [address] Phone: [phone]   Fax:  [phone]  Name: CORDERRO KOLOSKI MRN: [id number] Date of Birth: [birth year]

## 2020-06-18 NOTE — Patient Instructions (Signed)
PLEASE FEEL FREE TO CALL OUR OFFICE WITH ANY PROBLEMS OR QUESTIONS ([phone])      

## 2020-06-18 NOTE — Progress Notes (Signed)
Botox Injection for spasticity of upper extremity using needle EMG guidance Indication: Spastic hemiparesis (HCC)   Dilution: 100 Units/ml        Total Units Injected: 400 Indication: Severe spasticity which interferes with ADL,mobility and/or  hygiene and is unresponsive to medication management and other conservative care Informed consent was obtained after describing risks and benefits of the procedure with the patient. This includes bleeding, bruising, infection, excessive weakness, or medication side effects. A REMS form is on file and signed.  Needle: 60mm injectable monopolar needle electrode  left Number of units per muscle Pectoralis Major 50 units Pectoralis Minor 50  units Biceps 250 units Brachioradialis 50 units FCR 0 units FCU 0 units FDS 0 units FDP 0 units FPL 0 units Palmaris Longus 0 units Pronator Teres 0 units Pronator Quadratus 0 units Lumbricals 0 units All injections were done after obtaining appropriate EMG activity and after negative drawback for blood. The patient tolerated the procedure well. Post procedure instructions were given. Return in about 2 months (around 08/18/2020).

## 2020-06-18 NOTE — Therapy (Signed)
[facility] [address] [place], [place], [address] Phone: [phone]   Fax:  [phone]  Occupational Therapy Treatment  Patient Details  Name: Derek [name] MRN: [id number] Date of Birth: 1976/02/09 Referring Provider (OT): Dr. Jacklynn Lewis   Encounter Date: 06/18/2020   OT End of Session - 06/18/20 1410    Visit Number 10    Number of Visits 17    Date for OT Re-Evaluation 07/02/20    Authorization Type BC/BS - 60 combined visits for all disciplines    Authorization - Visit Number 10    Authorization - Number of Visits 30    OT Start Time 1315    OT Stop Time 1400    OT Time Calculation (min) 45 min    Activity Tolerance Patient tolerated treatment well    Behavior During Therapy [facility] for tasks assessed/performed;Flat affect           Past Medical History:  Diagnosis Date  . 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 Alcohol dependence (HCC)   . Back pain   . Marijuana dependence (HCC)   . Tobacco dependence     Past Surgical History:  Procedure Laterality Date  . BUBBLE STUDY  01/10/2020   Procedure: BUBBLE STUDY;  Surgeon: Sande Rives, MD;  Location: [facility] ENDOSCOPY;  Service: Cardiovascular;;  . TEE WITHOUT CARDIOVERSION N/A 01/10/2020   Procedure: TRANSESOPHAGEAL ECHOCARDIOGRAM (TEE);  Surgeon: Sande Rives, MD;  Location: [facility] ENDOSCOPY;  Service: Cardiovascular;  Laterality: N/A;    There were no vitals filed for this visit.   Subjective Assessment - 06/18/20 1321    Subjective  I did dress myself (tshirt, underwear, and pants) but it took about 10 minutes for each. I just got botox today too    Pertinent History CVA 01/07/20 w/ residual Lt hemiparesis. PMH: HTN, ETOH abuse, smoker    Limitations fall risk    Currently in Pain? Yes    Pain Score 3     Pain Location Shoulder    Pain Orientation Left    Pain Descriptors / Indicators Aching    Pain Type Chronic pain     Pain Onset More than a month ago    Pain Frequency Constant    Aggravating Factors  movement, rest, medication, gentle low stretching          Discussed upcoming pool appointment and getting there early to get ready. Discussed goal for dressing and review hemi-techniques. Therapist explained that it will take longer initially but he will get faster as he practices.  Closed chain body on arm movements w/ lateral trunk flexion, trunk rotation, and wt shifts - pt would begin to disassociate shoulder from trunk movements w/o pain. However, when moving to low range open chain AA/ROM, pt would become painful again (pt also appears to anticipate pain and pull back).                         OT Short Term Goals - 06/12/20 1357      OT SHORT TERM GOAL #1   Title Independent with HEP for LUE    Time 4    Period Weeks    Status On-going      OT SHORT TERM GOAL #2   Title Pt to be independent with splint wear and care prn    Time 4    Period Weeks    Status On-going  OT SHORT TERM GOAL #3   Title Pt to verbalize understanding with pain management strategies and proper positioning of LUE    Time 4    Period Weeks    Status On-going      OT SHORT TERM GOAL #4   Title Pt to bathe self with min assist seated    Baseline mod assist seated    Time 4    Period Weeks    Status On-going      OT SHORT TERM GOAL #5   Title Pt to perform UE dressing with min assist and LE dressing with mod assist    Baseline mod assist/max assist    Time 4    Period Weeks    Status On-going             OT Long Term Goals - 06/12/20 1358      OT LONG TERM GOAL #1   Title Pt to tolerate 75% PROM LUE with pain 3/10 or under    Time 8    Period Weeks    Status On-going      OT LONG TERM GOAL #2   Title Pt to use LUE as stabalizer for bilateral tasks    Time 8    Status On-going      OT LONG TERM GOAL #3   Title Pt to verbalize understanding with potential A/E needs and  task modifications to increase ease with ADLS and simple IADLS    Period Weeks    Status On-going      OT LONG TERM GOAL #4   Title Pt to be mod I level with bathing and UE dressing and min assist for LE dressing    Time 8    Period Weeks    Status On-going      OT LONG TERM GOAL #5   Title Pt to perform toileting at mod I level    Time 8    Period Weeks    Status On-going      OT LONG TERM GOAL #6   Title Pt to perform simple snack prep/sandwich prep from w/c level prn or w/ use of walker and walker tray    Time 8    Period Weeks                 Plan - 06/18/20 1411    Clinical Impression Statement Pt reported dressing tshirt, underwear and pants but with extra time needed (to be expected until further practice). Pt received botox to LUE proximally today. Pt continues to be limited by spasticity and pain - pt does well in closed chain body on arm movements but difficulty transitioning to any open chain low range AA/ROM.    OT Occupational Profile and History Detailed Assessment- Review of Records and additional review of physical, cognitive, psychosocial history related to current functional performance    Occupational performance deficits (Please refer to evaluation for details): ADL's;IADL's;Rest and Sleep;Leisure;Social Participation    Body Structure / Function / Physical Skills ADL;ROM;Dexterity;Edema;Balance;IADL;Body mechanics;Improper spinal/pelvic alignment;Sensation;Mobility;Strength;Muscle spasms;Coordination;Tone;Pain;UE functional use;Decreased knowledge of use of DME;Vision    Rehab Potential Fair    Clinical Decision Making Several treatment options, min-mod task modification necessary    Comorbidities Affecting Occupational Performance: May have comorbidities impacting occupational performance    Modification or Assistance to Complete Evaluation  No modification of tasks or assist necessary to complete eval    OT Frequency 2x / week    OT Duration 8 weeks  OT Treatment/Interventions Self-care/ADL training;Therapeutic exercise;Functional Mobility Training;Aquatic Therapy;Neuromuscular education;Manual Therapy;Splinting;Therapeutic activities;Coping strategies training;DME and/or AE instruction;Cognitive remediation/compensation;Electrical Stimulation;Visual/perceptual remediation/compensation;Moist Heat;Passive range of motion;Patient/family education;Ultrasound    Plan Remind pt again of pool appointment this coming Monday 8/23, continue NMR    OT Home Exercise Plan Initiated supine body on arm with partial rolling - girlfriend not present    Consulted and Agree with Plan of Care Patient           Patient will benefit from skilled therapeutic intervention in order to improve the following deficits and impairments:   Body Structure / Function / Physical Skills: ADL, ROM, Dexterity, Edema, Balance, IADL, Body mechanics, Improper spinal/pelvic alignment, Sensation, Mobility, Strength, Muscle spasms, Coordination, Tone, Pain, UE functional use, Decreased knowledge of use of DME, Vision       Visit Diagnosis: Hemiplegia and hemiparesis following cerebral infarction affecting left non-dominant side (HCC)  Pain in left arm    Problem List Patient Active Problem List   Diagnosis Date Noted  . Neuropathic pain 04/09/2020  . Pain   . Spastic hemiparesis (HCC)   . History of hypertension   . Dyslipidemia   . 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 Leukocytosis 01/12/2020  . Cerebrovascular accident (CVA) due to thrombosis of right middle cerebral artery (HCC)   . Chronic pain syndrome   . Hypokalemia   . Dysphagia, post-stroke   . 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 Tobacco dependence   . Marijuana dependence (HCC)   . Alcohol dependence (HCC)     Kelli Churn, OTR/L 06/18/2020, 2:13 PM  [facility] [address] [place], [place],  [address] Phone: [phone]   Fax:  [phone]  Name: Derek [name] MRN: [id number] Date of Birth: 1976-05-16

## 2020-06-19 ENCOUNTER — Ambulatory Visit: Payer: BC Managed Care – PPO | Admitting: Occupational Therapy

## 2020-06-19 ENCOUNTER — Ambulatory Visit: Payer: BC Managed Care – PPO

## 2020-06-19 DIAGNOSIS — R209 Unspecified disturbances of skin sensation: Secondary | ICD-10-CM | POA: Diagnosis not present

## 2020-06-19 DIAGNOSIS — R2681 Unsteadiness on feet: Secondary | ICD-10-CM | POA: Diagnosis not present

## 2020-06-19 DIAGNOSIS — I69354 Hemiplegia and hemiparesis following cerebral infarction affecting left non-dominant side: Secondary | ICD-10-CM | POA: Diagnosis not present

## 2020-06-19 DIAGNOSIS — G8110 Spastic hemiplegia affecting unspecified side: Secondary | ICD-10-CM | POA: Diagnosis not present

## 2020-06-19 DIAGNOSIS — M6281 Muscle weakness (generalized): Secondary | ICD-10-CM | POA: Diagnosis not present

## 2020-06-19 DIAGNOSIS — M79602 Pain in left arm: Secondary | ICD-10-CM | POA: Diagnosis not present

## 2020-06-19 DIAGNOSIS — R2689 Other abnormalities of gait and mobility: Secondary | ICD-10-CM | POA: Diagnosis not present

## 2020-06-19 NOTE — Therapy (Signed)
[facility] [address] [place], [place], [address] Phone: [phone]   Fax:  [phone]  Occupational Therapy Treatment  Patient Details  Name: Derek [name] MRN: [id number] Date of Birth: December 02, 1975 Referring Provider (OT): Dr. Jacklynn Lewis   Encounter Date: 06/19/2020   OT End of Session - 06/19/20 1655    Visit Number 11    Number of Visits 17    Date for OT Re-Evaluation 07/02/20    Authorization Type BC/BS - 60 combined visits for all disciplines    Authorization - Visit Number 11    Authorization - Number of Visits 30    OT Start Time 1530    OT Stop Time 1615    OT Time Calculation (min) 45 min    Activity Tolerance Patient tolerated treatment well    Behavior During Therapy [facility] for tasks assessed/performed;Flat affect           Past Medical History:  Diagnosis Date  . 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 Alcohol dependence (HCC)   . Back pain   . Marijuana dependence (HCC)   . Tobacco dependence     Past Surgical History:  Procedure Laterality Date  . BUBBLE STUDY  01/10/2020   Procedure: BUBBLE STUDY;  Surgeon: Sande Rives, MD;  Location: [facility] ENDOSCOPY;  Service: Cardiovascular;;  . TEE WITHOUT CARDIOVERSION N/A 01/10/2020   Procedure: TRANSESOPHAGEAL ECHOCARDIOGRAM (TEE);  Surgeon: Sande Rives, MD;  Location: [facility] ENDOSCOPY;  Service: Cardiovascular;  Laterality: N/A;    There were no vitals filed for this visit.   Subjective Assessment - 06/19/20 1538    Pertinent History CVA 01/07/20 w/ residual Lt hemiparesis. PMH: HTN, ETOH abuse, smoker    Limitations fall risk    Currently in Pain? Yes    Pain Score 3     Pain Location Shoulder    Pain Orientation Left    Pain Descriptors / Indicators Aching    Pain Onset More than a month ago    Pain Frequency Constant    Aggravating Factors  movement    Pain Relieving Factors rest, medication, low gentle body  on arm stretching          Supine to sidelying onto Rt side to begin AA/ROM LUE in gravity elim plane w/ moderate pain. Worked on scapula mobility and pt able to get sh flex with scapula protraction and retraction w/o pain, but no saggital plane movement. Worked on supine to Lt side to get weight over Lt shoulder and back to supine to stretch LUE in abduction (pillow under LUE) and spinal twist. Applied hot pack to Lt shoulder during this time as well as performing PROM to Lt UE in elbow ext, supination, and wrist/finger extension. Pt then able to perform AA/ROM in sh flex/ext (saggital plane) with mod assist while sidelying w/o pain.   O.T. also assisted during P.T. session for getting in tall kneeling with use of K bench for UE's during A/P wt shifts, and from short to tall kneeling - pt not billed under O.T. for this time                         OT Short Term Goals - 06/12/20 1357      OT SHORT TERM GOAL #1   Title Independent with HEP for LUE    Time 4    Period Weeks    Status On-going  OT SHORT TERM GOAL #2   Title Pt to be independent with splint wear and care prn    Time 4    Period Weeks    Status On-going      OT SHORT TERM GOAL #3   Title Pt to verbalize understanding with pain management strategies and proper positioning of LUE    Time 4    Period Weeks    Status On-going      OT SHORT TERM GOAL #4   Title Pt to bathe self with min assist seated    Baseline mod assist seated    Time 4    Period Weeks    Status On-going      OT SHORT TERM GOAL #5   Title Pt to perform UE dressing with min assist and LE dressing with mod assist    Baseline mod assist/max assist    Time 4    Period Weeks    Status On-going             OT Long Term Goals - 06/12/20 1358      OT LONG TERM GOAL #1   Title Pt to tolerate 75% PROM LUE with pain 3/10 or under    Time 8    Period Weeks    Status On-going      OT LONG TERM GOAL #2   Title Pt to use  LUE as stabalizer for bilateral tasks    Time 8    Status On-going      OT LONG TERM GOAL #3   Title Pt to verbalize understanding with potential A/E needs and task modifications to increase ease with ADLS and simple IADLS    Period Weeks    Status On-going      OT LONG TERM GOAL #4   Title Pt to be mod I level with bathing and UE dressing and min assist for LE dressing    Time 8    Period Weeks    Status On-going      OT LONG TERM GOAL #5   Title Pt to perform toileting at mod I level    Time 8    Period Weeks    Status On-going      OT LONG TERM GOAL #6   Title Pt to perform simple snack prep/sandwich prep from w/c level prn or w/ use of walker and walker tray    Time 8    Period Weeks                 Plan - 06/19/20 1655    Clinical Impression Statement Pt progressing with AA/ROM today after light weight bearing over LUE from supine to sidelying    OT Occupational Profile and History Detailed Assessment- Review of Records and additional review of physical, cognitive, psychosocial history related to current functional performance    Occupational performance deficits (Please refer to evaluation for details): ADL's;IADL's;Rest and Sleep;Leisure;Social Participation    Body Structure / Function / Physical Skills ADL;ROM;Dexterity;Edema;Balance;IADL;Body mechanics;Improper spinal/pelvic alignment;Sensation;Mobility;Strength;Muscle spasms;Coordination;Tone;Pain;UE functional use;Decreased knowledge of use of DME;Vision    Rehab Potential Fair    Clinical Decision Making Several treatment options, min-mod task modification necessary    Comorbidities Affecting Occupational Performance: May have comorbidities impacting occupational performance    Modification or Assistance to Complete Evaluation  No modification of tasks or assist necessary to complete eval    OT Frequency 2x / week    OT Duration 8 weeks    OT Treatment/Interventions Self-care/ADL  training;Therapeutic  exercise;Functional Mobility Training;Aquatic Therapy;Neuromuscular education;Manual Therapy;Splinting;Therapeutic activities;Coping strategies training;DME and/or AE instruction;Cognitive remediation/compensation;Electrical Stimulation;Visual/perceptual remediation/compensation;Moist Heat;Passive range of motion;Patient/family education;Ultrasound    Plan aquatic therapy, continue NMR and pain management    OT Home Exercise Plan Initiated supine body on arm with partial rolling - girlfriend not present    Consulted and Agree with Plan of Care Patient           Patient will benefit from skilled therapeutic intervention in order to improve the following deficits and impairments:   Body Structure / Function / Physical Skills: ADL, ROM, Dexterity, Edema, Balance, IADL, Body mechanics, Improper spinal/pelvic alignment, Sensation, Mobility, Strength, Muscle spasms, Coordination, Tone, Pain, UE functional use, Decreased knowledge of use of DME, Vision       Visit Diagnosis: Hemiplegia and hemiparesis following cerebral infarction affecting left non-dominant side [facility])    Problem List Patient Active Problem List   Diagnosis Date Noted  . Neuropathic pain 04/09/2020  . Pain   . Spastic hemiparesis (HCC)   . History of hypertension   . Dyslipidemia   . 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 Leukocytosis 01/12/2020  . Cerebrovascular accident (CVA) due to thrombosis of right middle cerebral artery (HCC)   . Chronic pain syndrome   . Hypokalemia   . Dysphagia, post-stroke   . 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 Tobacco dependence   . Marijuana dependence (HCC)   . Alcohol dependence (HCC)     Kelli Churn, OTR/L 06/19/2020, 4:57 PM  [facility] [address] [place], [place], [address] Phone: [phone]   Fax:  [phone]  Name: Derek [name] MRN: [id number] Date of Birth:  May 03, 1976

## 2020-06-19 NOTE — Therapy (Signed)
[facility] [address] [place], [place], [address] Phone: [phone]   Fax:  [phone]  Physical Therapy Treatment  Patient Details  Name: Derek [name] MRN: [id number] Date of Birth: 1976-10-14 Referring Provider (PT): Alger Simons, MD   Encounter Date: 06/19/2020   PT End of Session - 06/19/20 1618    Visit Number 13    Number of Visits 17    Date for PT Re-Evaluation 07/30/20   POC for 8 weeks, Cert for 90 days   Authorization Type BCBS    PT Start Time 1615    PT Stop Time 1659    PT Time Calculation (min) 44 min    Equipment Utilized During Treatment Gait belt    Activity Tolerance Patient tolerated treatment well    Behavior During Therapy [facility] for tasks assessed/performed;Flat affect           Past Medical History:  Diagnosis Date  . Acute ischemic right MCA stroke ([place]) 01/07/2020  . Alcohol dependence ([place])   . Back pain   . Marijuana dependence ([place])   . Tobacco dependence     Past Surgical History:  Procedure Laterality Date  . BUBBLE STUDY  01/10/2020   Procedure: BUBBLE STUDY;  Surgeon: Geralynn Rile, MD;  Location: [place];  Service: Cardiovascular;;  . TEE WITHOUT CARDIOVERSION N/A 01/10/2020   Procedure: TRANSESOPHAGEAL ECHOCARDIOGRAM (TEE);  Surgeon: Geralynn Rile, MD;  Location: [place];  Service: Cardiovascular;  Laterality: N/A;    There were no vitals filed for this visit.   Subjective Assessment - 06/19/20 1850    Subjective Patient doing well today. No new changes/complaints.    Pertinent History Alcohol and Tobacco Use    Limitations Standing;Walking;Sitting    How long can you stand comfortably? 3-5 minutes    Currently in Pain? Yes    Pain Score 3     Pain Location Shoulder    Pain Orientation Left    Pain Descriptors / Indicators Aching    Pain Onset More than a month ago                             [facility] Adult PT  Treatment/Exercise - 06/19/20 0001      Bed Mobility   Bed Mobility Supine to Sit    Supine to Sit Minimal Assistance - Patient > 75%   require assistance with LLE during session     Transfers   Transfers Sit to Stand;Stand to Sit    Sit to Stand 5: Supervision    Stand to Sit 5: Supervision      Ambulation/Gait   Ambulation/Gait Yes    Ambulation/Gait Assistance 4: Min guard    Ambulation/Gait Assistance Details completed ambulation with to SciFit machine with hemiwalker    Ambulation Distance (Feet) 25 Feet    Assistive device Hemi-walker    Gait Pattern Step-through pattern;Decreased step length - left;Decreased stance time - left;Decreased step length - right;Decreased hip/knee flexion - left;Decreased dorsiflexion - left    Ambulation Surface Level;Indoor      Neuro Re-ed    Neuro Re-ed Details  With patient on mat table in R sidelying, PT and OT worked simultaneously with patient to roll into quadruped position. Patient demo difficulty initially with coordinating movements, often demo apraxia. PT providing tactile cues and min A to get BLE in proper hip/knee flexion and OT providing assistance to get BUE in proper position.  Once in quadruped position, PT providing manual facilitation at pelvis to help with maintain neutral alignment and proper weight shifting ant/post and laterally.  Completed facilitation in quadruped position x 3 minutes. Progressed to modified tall kneeling onto bench, with patient weight bearing through B elbows. In this position PT working to promote ant/posterior weight shift and diagonal weight shift to L. PT often providing tactile and Min A for repositioning of LLE due to spasticity OT also providing tactile input and facilitation to LUE. Completed hip extension and working on coming into full tall kneeling with BUE support, with PT providing facilitation and tactile cues for improved hip extension and posture. Completed x 10 reps with slow control. With increased  time in position, notable decreased in spasticity/tone noted in LLE/LUE. Overall, patient tolerating well. Require min ?" mod A to get out of positioning and back into sidelying on mat. Standing without support and mirror placed in front for visual feedback, completed stepping forward/backward to target with RLE to promote weight shift/acceptance on LLE. PT providing manual faciliation to pelvis for improved weight shift, completed x 15 reps. Verbal cues for posture.       Exercises   Exercises Other Exercises    Other Exercises  On the SciFit, completed with BLE's and RUE. Completed on 1.0 Level x 5 1/2 minutes with PT providing min A and tactile cues for improved hip/knee flexion and pace.                     PT Short Term Goals - 06/03/20 1619      PT SHORT TERM GOAL #1   Title Patient will be independent with Initial HEP with caregiver assitance (for BLE strengthening/stretching)    Baseline needed review for proper technique of HEP    Time 4    Period Weeks    Status Partially Met    Target Date 05/29/20      PT SHORT TERM GOAL #2   Title Patient will demonstrate ability to complete transfer from w/c <> mat with LRAD, supervision level to demo improved independence with functional mobility    Baseline min guard with hemiwalker    Time 4    Period Weeks    Status Partially Met    Target Date 05/29/20      PT SHORT TERM GOAL #3   Title Patient will demonstrate ability to ambulate at least 150 ft w/ LRAD and CGA to demonstrate improved functional mobility    Baseline Patient able to ambulate 230 ft CGA ([place]) with Hemiwalker    Time 4    Period Weeks    Status Achieved    Target Date 05/29/20      PT SHORT TERM GOAL #4   Title Patient will improve TUG to </= 45 secs w/ LRAD to demo reduced risk for falls    Baseline 43.65 seconds with hemiwalker on 05/26/20    Time 4    Period Weeks    Status Achieved    Target Date 05/29/20             PT Long Term  Goals - 05/01/20 1951      PT LONG TERM GOAL #1   Title Patient will be independent with Final HEP    Time 8    Period Weeks    Status New    Target Date 06/26/20      PT LONG TERM GOAL #2   Title Patient will demonstrate ability to  ambulate > 250 ft w/ LRAD and Supv to demo improved community mobility    Time 8    Period Weeks    Status New    Target Date 06/26/20      PT LONG TERM GOAL #3   Title Patient will improve TUG to </= 35 secs w/ LRAD to demo reduced risk for falls    Baseline 58.66 secs w/ hemiwalker    Time 8    Period Weeks    Status New      PT LONG TERM GOAL #4   Title Patient will demo ability to ambulate at gait speed of >/= 2.0 ft/sec to demo improved functional mobility    Time 8    Period Weeks    Status New    Target Date 06/26/20      PT LONG TERM GOAL #5   Title Patient will demo ability to complete all bed mobility and transfers, Mod I, for improved independence    Time 8    Period Weeks    Status New                 Plan - 06/19/20 1859    Clinical Impression Statement Completed NMR in the quadruped and modified tall kneeling position today with asssitance of OT, working on improved weight shift to LUE/LLE, tone management, and improved functional mobility. Patient tolerating well but require Min -  Mod A to obtain position. Patient will continue to benefit from skilled PT services to progress toward all goals.    Personal Factors and Comorbidities Comorbidity 2    Comorbidities Chronic Back Pain,HTN, Prior History of Alcohol/Tobacco Use    Examination-Activity Limitations Bed Mobility;Stairs;Stand;Toileting;Transfers;Dressing    Examination-Participation Restrictions Community Activity;Driving;Yard Work;Other   Occupation   Stability/Clinical Decision Making Evolving/Moderate complexity    Rehab Potential Good    PT Frequency 2x / week    PT Duration 8 weeks    PT Treatment/Interventions ADLs/Self Care Home Management;Electrical  Stimulation;Aquatic Therapy;Moist Heat;DME Instruction;Gait training;Stair training;Functional mobility training;Therapeutic activities;Therapeutic exercise;Balance training;Neuromuscular re-education;Patient/family education;Orthotic Fit/Training;Wheelchair mobility training;Manual techniques;Passive range of motion    PT Next Visit Plan AFO brace for LLE? Strengthening Exercises for LLE. Gait training. Tall Kneeling, Activites for tone management. 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 Neuropathic pain 04/09/2020  . Pain   . Spastic hemiparesis ([place])   . History of hypertension   . Dyslipidemia   . Right middle cerebral artery stroke ([place]) 01/15/2020  . Leukocytosis 01/12/2020  . Cerebrovascular accident (CVA) due to thrombosis of right middle cerebral artery ([place])   . Chronic pain syndrome   . Hypokalemia   . Dysphagia, post-stroke   . Acute ischemic right MCA stroke ([place]) 01/07/2020  . Tobacco dependence   . Marijuana dependence ([place])   .  Alcohol dependence ([place])     Jones Bales, PT, DPT 06/19/2020, 7:02 PM  [facility] [address]  [place], [place], [address] Phone: [phone]   Fax:  [phone]  Name: Derek [name] MRN: [id number] Date of Birth: August 25, 1976

## 2020-06-20 ENCOUNTER — Other Ambulatory Visit: Payer: Self-pay | Admitting: *Deleted

## 2020-06-20 MED ORDER — BACLOFEN 20 MG PO TABS: 20.0000 mg | ORAL_TABLET | Freq: Three times a day (TID) | ORAL | 3 refills | Status: DC

## 2020-06-23 ENCOUNTER — Encounter: Payer: Self-pay | Admitting: Occupational Therapy

## 2020-06-23 ENCOUNTER — Ambulatory Visit: Payer: BC Managed Care – PPO | Admitting: Occupational Therapy

## 2020-06-23 DIAGNOSIS — M6281 Muscle weakness (generalized): Secondary | ICD-10-CM | POA: Diagnosis not present

## 2020-06-23 DIAGNOSIS — I69354 Hemiplegia and hemiparesis following cerebral infarction affecting left non-dominant side: Secondary | ICD-10-CM | POA: Diagnosis not present

## 2020-06-23 DIAGNOSIS — R209 Unspecified disturbances of skin sensation: Secondary | ICD-10-CM | POA: Diagnosis not present

## 2020-06-23 DIAGNOSIS — G8110 Spastic hemiplegia affecting unspecified side: Secondary | ICD-10-CM | POA: Diagnosis not present

## 2020-06-23 DIAGNOSIS — R2681 Unsteadiness on feet: Secondary | ICD-10-CM | POA: Diagnosis not present

## 2020-06-23 DIAGNOSIS — R2689 Other abnormalities of gait and mobility: Secondary | ICD-10-CM | POA: Diagnosis not present

## 2020-06-23 DIAGNOSIS — M79602 Pain in left arm: Secondary | ICD-10-CM | POA: Diagnosis not present

## 2020-06-23 NOTE — Therapy (Signed)
[facility] [address] [place], [place], [address] Phone: [phone]   Fax:  [phone]  Occupational Therapy Treatment  Patient Details  Name: Derek [name] MRN: [id number] Date of Birth: 02/24/1976 Referring Provider (OT): Dr. Jacklynn Lewis   Encounter Date: 06/23/2020   OT End of Session - 06/23/20 2006    Visit Number 12    Number of Visits 17    Date for OT Re-Evaluation 07/02/20    Authorization Type BC/BS - 60 combined visits for all disciplines    Authorization - Visit Number 12    Authorization - Number of Visits 30    OT Start Time 1550    OT Stop Time 1645    OT Time Calculation (min) 55 min    Activity Tolerance Patient tolerated treatment well    Behavior During Therapy [facility] for tasks assessed/performed;Flat affect           Past Medical History:  Diagnosis Date  . 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 Alcohol dependence (HCC)   . Back pain   . Marijuana dependence (HCC)   . Tobacco dependence     Past Surgical History:  Procedure Laterality Date  . BUBBLE STUDY  01/10/2020   Procedure: BUBBLE STUDY;  Surgeon: Sande Rives, MD;  Location: [facility] ENDOSCOPY;  Service: Cardiovascular;;  . TEE WITHOUT CARDIOVERSION N/A 01/10/2020   Procedure: TRANSESOPHAGEAL ECHOCARDIOGRAM (TEE);  Surgeon: Sande Rives, MD;  Location: [facility] ENDOSCOPY;  Service: Cardiovascular;  Laterality: N/A;    There were no vitals filed for this visit.   Subjective Assessment - 06/23/20 2004    Subjective  Patient expressed a bit of nervousness at pool session today, but eager to try it.    Pertinent History CVA 01/07/20 w/ residual Lt hemiparesis. PMH: HTN, ETOH abuse, smoker    Limitations fall risk    Currently in Pain? Yes    Pain Score 3     Pain Location Shoulder    Pain Orientation Left    Pain Descriptors / Indicators Aching    Pain Type Chronic pain    Pain Onset More than a month  ago    Pain Frequency Constant    Aggravating Factors  range of motion    Pain Relieving Factors rest, medication, gentle body on arm stretching    Multiple Pain Sites No           Patient brought into pool by his brother.  Patient walking with hemi-walker and close supervision. Patient entered and exited the pool via ramp and moderate to minimal assistance.  Upon exiting used left hand on hand rail with mod assist.   Worked initially on foot contact with floor during walking.  Patient had difficulty initially with knee flexion and heel rise to allow swing of left leg.  With alignment, facilitation of weight shift, and cueing, patient able to demonstrate partially knee flexion prior to swing.   Provided floatation support at head/neck, hips and knees to allow supine exercise in the water.  Initially worked on passive elongation and shortening of lateral trunk/ neck.  Progressed to passive stretch to shoulders.  Patient able to achieve nearly 90 degrees of shoulder abduction today with only minimal report of discomfort that resolved with repetition.                        OT Education - 06/23/20 2005    Education Details impact of  water on hemiplegic limbs    Person(s) Educated Patient    Methods Explanation    Comprehension Need further instruction            OT Short Term Goals - 06/23/20 2008      OT SHORT TERM GOAL #1   Title Independent with HEP for LUE    Time 4    Period Weeks    Status On-going      OT SHORT TERM GOAL #2   Title Pt to be independent with splint wear and care prn    Time 4    Period Weeks    Status On-going      OT SHORT TERM GOAL #3   Title Pt to verbalize understanding with pain management strategies and proper positioning of LUE    Time 4    Period Weeks    Status On-going      OT SHORT TERM GOAL #4   Title Pt to bathe self with min assist seated    Baseline mod assist seated    Time 4    Period Weeks    Status On-going       OT SHORT TERM GOAL #5   Title Pt to perform UE dressing with min assist and LE dressing with mod assist    Baseline mod assist/max assist    Time 4    Period Weeks    Status On-going             OT Long Term Goals - 06/23/20 2008      OT LONG TERM GOAL #1   Title Pt to tolerate 75% PROM LUE with pain 3/10 or under    Time 8    Period Weeks    Status On-going      OT LONG TERM GOAL #2   Title Pt to use LUE as stabalizer for bilateral tasks    Time 8    Status On-going      OT LONG TERM GOAL #3   Title Pt to verbalize understanding with potential A/E needs and task modifications to increase ease with ADLS and simple IADLS    Period Weeks    Status On-going      OT LONG TERM GOAL #4   Title Pt to be mod I level with bathing and UE dressing and min assist for LE dressing    Time 8    Period Weeks    Status On-going      OT LONG TERM GOAL #5   Title Pt to perform toileting at mod I level    Time 8    Period Weeks    Status On-going      OT LONG TERM GOAL #6   Title Pt to perform simple snack prep/sandwich prep from w/c level prn or w/ use of walker and walker tray    Time 8    Period Weeks                 Plan - 06/23/20 2006    Clinical Impression Statement Patient had first aquatic therapy session today. He reports recently seeing MD and getting refill on Baclofen (which he reportedly has not been taking) and pain medicine.    OT Occupational Profile and History Detailed Assessment- Review of Records and additional review of physical, cognitive, psychosocial history related to current functional performance    Occupational performance deficits (Please refer to evaluation for details): ADL's;IADL's;Rest and Sleep;Leisure;Social Participation    Body Structure /  Function / Physical Skills ADL;ROM;Dexterity;Edema;Balance;IADL;Body mechanics;Improper spinal/pelvic alignment;Sensation;Mobility;Strength;Muscle spasms;Coordination;Tone;Pain;UE functional  use;Decreased knowledge of use of DME;Vision    Rehab Potential Fair    Clinical Decision Making Several treatment options, min-mod task modification necessary    Comorbidities Affecting Occupational Performance: May have comorbidities impacting occupational performance    Modification or Assistance to Complete Evaluation  No modification of tasks or assist necessary to complete eval    OT Frequency 2x / week    OT Duration 8 weeks    OT Treatment/Interventions Self-care/ADL training;Therapeutic exercise;Functional Mobility Training;Aquatic Therapy;Neuromuscular education;Manual Therapy;Splinting;Therapeutic activities;Coping strategies training;DME and/or AE instruction;Cognitive remediation/compensation;Electrical Stimulation;Visual/perceptual remediation/compensation;Moist Heat;Passive range of motion;Patient/family education;Ultrasound    Plan aquatic therapy, continue NMR and pain management    OT Home Exercise Plan Initiated supine body on arm with partial rolling - girlfriend not present    Consulted and Agree with Plan of Care Patient           Patient will benefit from skilled therapeutic intervention in order to improve the following deficits and impairments:   Body Structure / Function / Physical Skills: ADL, ROM, Dexterity, Edema, Balance, IADL, Body mechanics, Improper spinal/pelvic alignment, Sensation, Mobility, Strength, Muscle spasms, Coordination, Tone, Pain, UE functional use, Decreased knowledge of use of DME, Vision       Visit Diagnosis: Hemiplegia and hemiparesis following cerebral infarction affecting left non-dominant side (HCC)  Muscle weakness (generalized)  Unsteadiness on feet  Other abnormalities of gait and mobility  Pain in left arm  Spastic 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 Neuropathic pain  04/09/2020  . Pain   . Spastic hemiparesis (HCC)   . History of hypertension   . Dyslipidemia   . 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 Leukocytosis 01/12/2020  . Cerebrovascular accident (CVA) due to thrombosis of right middle cerebral artery (HCC)   . Chronic pain syndrome   . Hypokalemia   . Dysphagia, post-stroke   . 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 Tobacco dependence   . Marijuana dependence (HCC)   . Alcohol dependence (HCC)     Collier Salina, OTR/L 06/23/2020, 8:09 PM  [facility] [address] [place], [place], [address] Phone: [phone]   Fax:  [phone]  Name: TREYSON AXEL MRN: [id number] Date of Birth: 26-May-1976

## 2020-06-24 ENCOUNTER — Encounter: Payer: BC Managed Care – PPO | Admitting: Occupational Therapy

## 2020-06-24 ENCOUNTER — Ambulatory Visit: Payer: BC Managed Care – PPO

## 2020-06-26 ENCOUNTER — Ambulatory Visit: Payer: BC Managed Care – PPO

## 2020-06-26 ENCOUNTER — Other Ambulatory Visit: Payer: Self-pay

## 2020-06-26 ENCOUNTER — Encounter: Payer: Self-pay | Admitting: Occupational Therapy

## 2020-06-26 ENCOUNTER — Ambulatory Visit: Payer: BC Managed Care – PPO | Admitting: Occupational Therapy

## 2020-06-26 DIAGNOSIS — M6281 Muscle weakness (generalized): Secondary | ICD-10-CM

## 2020-06-26 DIAGNOSIS — I69354 Hemiplegia and hemiparesis following cerebral infarction affecting left non-dominant side: Secondary | ICD-10-CM

## 2020-06-26 DIAGNOSIS — R2681 Unsteadiness on feet: Secondary | ICD-10-CM | POA: Diagnosis not present

## 2020-06-26 DIAGNOSIS — R2689 Other abnormalities of gait and mobility: Secondary | ICD-10-CM

## 2020-06-26 DIAGNOSIS — G8110 Spastic hemiplegia affecting unspecified side: Secondary | ICD-10-CM | POA: Diagnosis not present

## 2020-06-26 DIAGNOSIS — R209 Unspecified disturbances of skin sensation: Secondary | ICD-10-CM | POA: Diagnosis not present

## 2020-06-26 DIAGNOSIS — R208 Other disturbances of skin sensation: Secondary | ICD-10-CM

## 2020-06-26 DIAGNOSIS — M79602 Pain in left arm: Secondary | ICD-10-CM | POA: Diagnosis not present

## 2020-06-26 NOTE — Therapy (Signed)
[facility] [address] [place], [place], [address] Phone: [phone]   Fax:  [phone]  Physical Therapy Treatment  Patient Details  Name: Derek [name] MRN: [id number] Date of Birth: 1976/06/05 Referring Provider (PT): Alger Simons, MD   Encounter Date: 06/26/2020   PT End of Session - 06/26/20 1621    Visit Number 14    Number of Visits 26    Date for PT Re-Evaluation 09/24/20   POC for 6 weeks, Cert for 90 days   Authorization Type BCBS    PT Start Time 1616    PT Stop Time 1658    PT Time Calculation (min) 42 min    Equipment Utilized During Treatment Gait belt    Activity Tolerance Patient tolerated treatment well    Behavior During Therapy [facility] for tasks assessed/performed;Flat affect           Past Medical History:  Diagnosis Date  . Acute ischemic right MCA stroke ([place]) 01/07/2020  . Alcohol dependence ([place])   . Back pain   . Marijuana dependence ([place])   . Tobacco dependence     Past Surgical History:  Procedure Laterality Date  . BUBBLE STUDY  01/10/2020   Procedure: BUBBLE STUDY;  Surgeon: Geralynn Rile, MD;  Location: [place];  Service: Cardiovascular;;  . TEE WITHOUT CARDIOVERSION N/A 01/10/2020   Procedure: TRANSESOPHAGEAL ECHOCARDIOGRAM (TEE);  Surgeon: Geralynn Rile, MD;  Location: [place];  Service: Cardiovascular;  Laterality: N/A;    There were no vitals filed for this visit.   Subjective Assessment - 06/26/20 1620    Subjective Patient reports pool session went well. No other new changes/complaints.    Pertinent History Alcohol and Tobacco Use    Limitations Standing;Walking;Sitting    How long can you stand comfortably? 3-5 minutes    Currently in Pain? Yes    Pain Score 5     Pain Location Shoulder    Pain Orientation Left    Pain Descriptors / Indicators Aching    Pain Type Chronic pain    Pain Onset More than a month ago                  [facility] PT Assessment - 06/26/20 1935      Assessment   Medical Diagnosis Rt MCA CVA    Referring Provider (PT) Alger Simons, MD               [facility] Adult PT Treatment/Exercise - 06/26/20 1935      Bed Mobility   Bed Mobility Supine to Sit;Sit to Supine;Rolling Left;Rolling Right    Rolling Right Contact Guard/Touching assist    Rolling Left Contact Guard/Touching assist    Supine to Sit Contact Guard/Touching assist    Sit to Supine Supervision/Verbal cueing      Transfers   Transfers Stand Pivot Transfers;Sit to Stand;Stand to Sit    Sit to Stand 5: Supervision    Sit to Stand Details Verbal cues for precautions/safety;Verbal cues for technique;Manual facilitation for weight shifting    Stand to Sit 5: Supervision    Stand to Sit Details (indicate cue type and reason) Verbal cues for precautions/safety;Verbal cues for technique    Stand Pivot Transfers 4: Min guard    Comments all transfers completed with hemiwalker, and from w/c <> mat. CGA throughout with completion.       Ambulation/Gait   Ambulation/Gait Yes    Ambulation/Gait Assistance 4: Min guard  Ambulation/Gait Assistance Details completd ambulation with hemiwalker, patient able to ambulate x 400 ft but require CGA throughout with ambulation. Patient require verbal cues to keep hemiwalker properly aligned with ambulation    Ambulation Distance (Feet) 400 Feet    Assistive device Hemi-walker    Gait Pattern Step-through pattern;Decreased step length - left;Decreased stance time - left;Decreased step length - right;Decreased hip/knee flexion - left;Decreased dorsiflexion - left    Ambulation Surface Level;Indoor    Gait velocity 25.88 secs = 1.26 ft/sec      Standardized Balance Assessment   Standardized Balance Assessment Timed Up and Go Test      Timed Up and Go Test   TUG Normal TUG    Normal TUG (seconds) 31.19   w/ hemiwalker     Self-Care   Self-Care Other Self-Care Comments    Other Self-Care  Comments  Due to patient reporting only completing HEP 1x/week and not walking frequent. PT had frank conversation with patient about importance of movement daily for mangaement of tone as well as to further improve functional mobility. PT educating importance ot continue to achieve gains and make progess with PT services if being sedentary at home. Patient verbalizing agreement.       Exercises   Exercises Other Exercises    Other Exercises  Completed Heel Slides x 5 reps in seated position with towel placed under LLE. PT providing manual PROM to LLE to promote motion, increased tone noted today.                     PT Short Term Goals - 06/03/20 1619      PT SHORT TERM GOAL #1   Title Patient will be independent with Initial HEP with caregiver assitance (for BLE strengthening/stretching)    Baseline needed review for proper technique of HEP    Time 4    Period Weeks    Status Partially Met    Target Date 05/29/20      PT SHORT TERM GOAL #2   Title Patient will demonstrate ability to complete transfer from w/c <> mat with LRAD, supervision level to demo improved independence with functional mobility    Baseline min guard with hemiwalker    Time 4    Period Weeks    Status Partially Met    Target Date 05/29/20      PT SHORT TERM GOAL #3   Title Patient will demonstrate ability to ambulate at least 150 ft w/ LRAD and CGA to demonstrate improved functional mobility    Baseline Patient able to ambulate 230 ft CGA ([place]) with Hemiwalker    Time 4    Period Weeks    Status Achieved    Target Date 05/29/20      PT SHORT TERM GOAL #4   Title Patient will improve TUG to </= 45 secs w/ LRAD to demo reduced risk for falls    Baseline 43.65 seconds with hemiwalker on 05/26/20    Time 4    Period Weeks    Status Achieved    Target Date 05/29/20             PT Long Term Goals - 06/26/20 1622      PT LONG TERM GOAL #1   Title Patient will be independent with Final  HEP    Baseline Patient reports completing HEP 1x/week, patient reports difficulty getting assistance to complete exercises.    Time 8    Period Weeks  Status Not Met      PT LONG TERM GOAL #2   Title Patient will demonstrate ability to ambulate > 250 ft w/ LRAD and Supv to demo improved community mobility    Baseline 400 ft w/ hemiwalker CGA    Time 8    Period Weeks    Status Partially Met      PT LONG TERM GOAL #3   Title Patient will improve TUG to </= 35 secs w/ LRAD to demo reduced risk for falls    Baseline 58.66 secs w/ hemiwalker, 31.19 secs w/ hemiwalker    Time 8    Period Weeks    Status Achieved      PT LONG TERM GOAL #4   Title Patient will demo ability to ambulate at gait speed of >/= 2.0 ft/sec to demo improved functional mobility    Baseline 1.26 ft/sec    Time 8    Period Weeks    Status Not Met      PT LONG TERM GOAL #5   Title Patient will demo ability to complete all bed mobility and transfers, Mod I, for improved independence    Baseline CGA for bed mobility and transfers    Time 8    Period Weeks    Status Not Met              PT Short Term Goals - 06/26/20 1946      PT SHORT TERM GOAL #1   Title Patient will report completing walking daily at home 2x/day (ALL STGs Due: 07/24/2020)    Baseline not completing daily    Time 3    Period Weeks    Status New    Target Date 07/24/20      PT SHORT TERM GOAL #2   Time --           PT Long Term Goals - 06/26/20 1622      PT LONG TERM GOAL #1   Title Patient will be independent and complaint with Final HEP and walking program with caregiver assistance (ALL LTGs: 08/07/20)    Baseline Patient reports completing HEP 1x/week, patient reports difficulty getting assistance to complete exercises.    Time 6    Period Weeks    Status Revised    Target Date 08/07/20      PT LONG TERM GOAL #2   Title Patient will demonstrate ability to ambulate > 500 ft w/ LRAD and Supv to demo improved community  mobility    Baseline 400 ft w/ hemiwalker CGA    Time 6    Period Weeks    Status Revised      PT LONG TERM GOAL #3   Title Patient will improve TUG to </= 25 secs w/ LRAD to demo reduced risk for falls    Baseline 58.66 secs w/ hemiwalker, 31.19 secs w/ hemiwalker    Time 6    Period Weeks    Status Revised      PT LONG TERM GOAL #4   Title Patient will demo ability to ambulate at gait speed of >/= 2.0 ft/sec to demo improved functional mobility    Baseline 1.26 ft/sec    Time 8    Period Weeks    Status On-going      PT LONG TERM GOAL #5   Title Patient will demo ability to complete all bed mobility and transfers, Mod I, for improved independence    Baseline CGA for bed mobility and transfers  Time 8    Period Weeks    Status On-going                Plan - 06/26/20 1942    Clinical Impression Statement Today's skilled session included assessment of patient's progress toward LTGs. Patient able to meet TUG goal, improving TUG time to 31.19 secs with hemiwalker. Patient unable to meet any other goals today, but has demonstrated progress with PT services, requiring less assistance for bed mobility and improved ambulation distances, but still requiring CGA throughout at this time with functional mobility. Patient will continue to benefit from skilled PT services to maximize functional mobility.    Personal Factors and Comorbidities Comorbidity 2    Comorbidities Chronic Back Pain,HTN, Prior History of Alcohol/Tobacco Use    Examination-Activity Limitations Bed Mobility;Stairs;Stand;Toileting;Transfers;Dressing    Examination-Participation Restrictions Community Activity;Driving;Yard Work;Other   Occupation   Stability/Clinical Decision Making Evolving/Moderate complexity    Rehab Potential Good    PT Frequency 2x / week    PT Duration 6 weeks    PT Treatment/Interventions ADLs/Self Care Home Management;Electrical Stimulation;Aquatic Therapy;Moist Heat;DME Instruction;Gait  training;Stair training;Functional mobility training;Therapeutic activities;Therapeutic exercise;Balance training;Neuromuscular re-education;Patient/family education;Orthotic Fit/Training;Wheelchair mobility training;Manual techniques;Passive range of motion    PT Next Visit Plan Trial AFO braces for LLE.    Consulted and Agree with Plan of Care Patient           Patient will benefit from skilled therapeutic intervention in order to improve the following deficits and impairments:  Abnormal gait, Decreased balance, Decreased endurance, Decreased mobility, Difficulty walking, Impaired tone, Impaired sensation, Pain, Impaired UE functional use, Impaired flexibility, Decreased strength, Decreased knowledge of use of DME, Decreased activity tolerance, Decreased coordination, Decreased range of motion, Increased muscle spasms  Visit Diagnosis: Muscle weakness (generalized)  Unsteadiness on feet  Other abnormalities of gait and mobility  Hemiplegia and hemiparesis following cerebral infarction affecting left non-dominant side [facility])     Problem List Patient Active Problem List   Diagnosis Date Noted  . Neuropathic pain 04/09/2020  . Pain   . Spastic hemiparesis ([place])   . History of hypertension   . Dyslipidemia   . Right middle cerebral artery stroke ([place]) 01/15/2020  . Leukocytosis 01/12/2020  . Cerebrovascular accident (CVA) due to thrombosis of right middle cerebral artery ([place])   . Chronic pain syndrome   . Hypokalemia   . Dysphagia, post-stroke   . Acute ischemic right MCA stroke ([place]) 01/07/2020  . Tobacco dependence   . Marijuana dependence ([place])   . Alcohol dependence ([place])     Jones Bales, PT, DPT 06/26/2020, 7:45 PM  [place] [address] [place], [place], [address] Phone: [phone]   Fax:  [phone]  Name: Derek [name] MRN: [id number] Date of Birth: February 07, 1976

## 2020-06-26 NOTE — Therapy (Signed)
[facility] [address] [place], [place], [address] Phone: [phone]   Fax:  [phone]  Occupational Therapy Treatment  Patient Details  Name: Derek [name] MRN: [id number] Date of Birth: Oct 03, 1976 Referring Provider (OT): Dr. Jacklynn Lewis   Encounter Date: 06/26/2020   OT End of Session - 06/26/20 1842    Visit Number 13    Number of Visits 17    Date for OT Re-Evaluation 07/02/20    Authorization Type BC/BS - 60 combined visits for all disciplines    Authorization - Visit Number 13    Authorization - Number of Visits 30    OT Start Time 1700    OT Stop Time 1745    OT Time Calculation (min) 45 min    Activity Tolerance Patient limited by pain    Behavior During Therapy Anxious           Past Medical History:  Diagnosis Date  . 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 Alcohol dependence (HCC)   . Back pain   . Marijuana dependence (HCC)   . Tobacco dependence     Past Surgical History:  Procedure Laterality Date  . BUBBLE STUDY  01/10/2020   Procedure: BUBBLE STUDY;  Surgeon: Sande Rives, MD;  Location: [facility] ENDOSCOPY;  Service: Cardiovascular;;  . TEE WITHOUT CARDIOVERSION N/A 01/10/2020   Procedure: TRANSESOPHAGEAL ECHOCARDIOGRAM (TEE);  Surgeon: Sande Rives, MD;  Location: [facility] ENDOSCOPY;  Service: Cardiovascular;  Laterality: N/A;    There were no vitals filed for this visit.   Subjective Assessment - 06/26/20 1704    Subjective  I thought I would have more mobility (after first pool session)    Patient is accompanied by: Family member    Pertinent History CVA 01/07/20 w/ residual Lt hemiparesis. PMH: HTN, ETOH abuse, smoker    Limitations fall risk    Currently in Pain? Yes    Pain Score 4     Pain Location Shoulder    Pain Orientation Left    Pain Descriptors / Indicators Aching    Pain Type Chronic pain    Pain Onset More than a month ago    Pain Frequency  Intermittent                        OT Treatments/Exercises (OP) - 06/26/20 0001      Neurological Re-education Exercises   Other Exercises 1 Followed mobilization with active assisted motion in left arm in sidelying - arm supported on ball, working in pain free rnages.        Manual Therapy   Manual Therapy Scapular mobilization    Scapular Mobilization Patient with increased pain and stiffness today.  Body on arm motion less effective than in past sessions.  Transitioned from supine to sidelying to mobilize scapula.  Patient felt immediate relief of pain with mobilization.                      OT Short Term Goals - 06/23/20 2008      OT SHORT TERM GOAL #1   Title Independent with HEP for LUE    Time 4    Period Weeks    Status On-going      OT SHORT TERM GOAL #2   Title Pt to be independent with splint wear and care prn    Time 4    Period Weeks    Status On-going  OT SHORT TERM GOAL #3   Title Pt to verbalize understanding with pain management strategies and proper positioning of LUE    Time 4    Period Weeks    Status On-going      OT SHORT TERM GOAL #4   Title Pt to bathe self with min assist seated    Baseline mod assist seated    Time 4    Period Weeks    Status On-going      OT SHORT TERM GOAL #5   Title Pt to perform UE dressing with min assist and LE dressing with mod assist    Baseline mod assist/max assist    Time 4    Period Weeks    Status On-going             OT Long Term Goals - 06/23/20 2008      OT LONG TERM GOAL #1   Title Pt to tolerate 75% PROM LUE with pain 3/10 or under    Time 8    Period Weeks    Status On-going      OT LONG TERM GOAL #2   Title Pt to use LUE as stabalizer for bilateral tasks    Time 8    Status On-going      OT LONG TERM GOAL #3   Title Pt to verbalize understanding with potential A/E needs and task modifications to increase ease with ADLS and simple IADLS    Period Weeks     Status On-going      OT LONG TERM GOAL #4   Title Pt to be mod I level with bathing and UE dressing and min assist for LE dressing    Time 8    Period Weeks    Status On-going      OT LONG TERM GOAL #5   Title Pt to perform toileting at mod I level    Time 8    Period Weeks    Status On-going      OT LONG TERM GOAL #6   Title Pt to perform simple snack prep/sandwich prep from w/c level prn or w/ use of walker and walker tray    Time 8    Period Weeks                 Plan - 06/26/20 1843    Clinical Impression Statement Patient with limited carryover to home of functional mobility or ADL activities.  Patient with increased pain and tension throughout left side.  Spoke frankly with patient about need for mobility at home, that sitting all day was actually contributing to pain and stiffness.    OT Occupational Profile and History Detailed Assessment- Review of Records and additional review of physical, cognitive, psychosocial history related to current functional performance    Occupational performance deficits (Please refer to evaluation for details): ADL's;IADL's;Rest and Sleep;Leisure;Social Participation    Body Structure / Function / Physical Skills ADL;ROM;Dexterity;Edema;Balance;IADL;Body mechanics;Improper spinal/pelvic alignment;Sensation;Mobility;Strength;Muscle spasms;Coordination;Tone;Pain;UE functional use;Decreased knowledge of use of DME;Vision    Rehab Potential Fair    Clinical Decision Making Several treatment options, min-mod task modification necessary    Comorbidities Affecting Occupational Performance: May have comorbidities impacting occupational performance    Modification or Assistance to Complete Evaluation  No modification of tasks or assist necessary to complete eval    OT Frequency 2x / week    OT Duration 8 weeks    OT Treatment/Interventions Self-care/ADL training;Therapeutic exercise;Functional Mobility Training;Aquatic Therapy;Neuromuscular  education;Manual Therapy;Splinting;Therapeutic activities;Coping  strategies training;DME and/or AE instruction;Cognitive remediation/compensation;Electrical Stimulation;Visual/perceptual remediation/compensation;Moist Heat;Passive range of motion;Patient/family education;Ultrasound    Plan aquatic therapy, continue NMR and pain management    OT Home Exercise Plan Initiated supine body on arm with partial rolling - girlfriend not present    Consulted and Agree with Plan of Care Patient           Patient will benefit from skilled therapeutic intervention in order to improve the following deficits and impairments:   Body Structure / Function / Physical Skills: ADL, ROM, Dexterity, Edema, Balance, IADL, Body mechanics, Improper spinal/pelvic alignment, Sensation, Mobility, Strength, Muscle spasms, Coordination, Tone, Pain, UE functional use, Decreased knowledge of use of DME, Vision       Visit Diagnosis: Muscle weakness (generalized)  Unsteadiness on feet  Pain in left arm  Spastic 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 Neuropathic pain 04/09/2020  . Pain   . Spastic hemiparesis (HCC)   . History of hypertension   . Dyslipidemia   . 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 Leukocytosis 01/12/2020  . Cerebrovascular accident (CVA) due to thrombosis of right middle cerebral artery (HCC)   . Chronic pain syndrome   . Hypokalemia   . Dysphagia, post-stroke   . 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 Tobacco dependence   . Marijuana dependence (HCC)   . Alcohol dependence [facility])     Collier Salina, OTR/L 06/26/2020, 6:45 PM  [facility] [address] [place], [place], [address] Phone: [phone]   Fax:  [phone]  Name: AYODELE SANGALANG MRN: [id number] Date of Birth: [birth year]

## 2020-06-30 ENCOUNTER — Encounter: Payer: Self-pay | Admitting: Occupational Therapy

## 2020-06-30 ENCOUNTER — Ambulatory Visit: Payer: BC Managed Care – PPO | Admitting: Occupational Therapy

## 2020-06-30 DIAGNOSIS — R2681 Unsteadiness on feet: Secondary | ICD-10-CM | POA: Diagnosis not present

## 2020-06-30 DIAGNOSIS — I69354 Hemiplegia and hemiparesis following cerebral infarction affecting left non-dominant side: Secondary | ICD-10-CM | POA: Diagnosis not present

## 2020-06-30 DIAGNOSIS — M6281 Muscle weakness (generalized): Secondary | ICD-10-CM | POA: Diagnosis not present

## 2020-06-30 DIAGNOSIS — R2689 Other abnormalities of gait and mobility: Secondary | ICD-10-CM | POA: Diagnosis not present

## 2020-06-30 DIAGNOSIS — G8110 Spastic hemiplegia affecting unspecified side: Secondary | ICD-10-CM | POA: Diagnosis not present

## 2020-06-30 DIAGNOSIS — M79602 Pain in left arm: Secondary | ICD-10-CM | POA: Diagnosis not present

## 2020-06-30 DIAGNOSIS — R209 Unspecified disturbances of skin sensation: Secondary | ICD-10-CM | POA: Diagnosis not present

## 2020-06-30 NOTE — Therapy (Signed)
[facility] [address] [place], [place], [address] Phone: [phone]   Fax:  [phone]  Occupational Therapy Treatment  Patient Details  Name: Derek [name] MRN: [id number] Date of Birth: 03/01/1976 Referring Provider (OT): Dr. Jacklynn Lewis   Encounter Date: 06/30/2020   OT End of Session - 06/30/20 1841    Visit Number 14    Number of Visits 17    Date for OT Re-Evaluation 07/02/20    Authorization Type BC/BS - 60 combined visits for all disciplines    Authorization - Visit Number 14    Authorization - Number of Visits 30    OT Start Time 1548    OT Stop Time 1638    OT Time Calculation (min) 50 min    Activity Tolerance Patient tolerated treatment well    Behavior During Therapy [facility] for tasks assessed/performed           Past Medical History:  Diagnosis Date  . 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 Alcohol dependence (HCC)   . Back pain   . Marijuana dependence (HCC)   . Tobacco dependence     Past Surgical History:  Procedure Laterality Date  . BUBBLE STUDY  01/10/2020   Procedure: BUBBLE STUDY;  Surgeon: Sande Rives, MD;  Location: [facility] ENDOSCOPY;  Service: Cardiovascular;;  . TEE WITHOUT CARDIOVERSION N/A 01/10/2020   Procedure: TRANSESOPHAGEAL ECHOCARDIOGRAM (TEE);  Surgeon: Sande Rives, MD;  Location: [facility] ENDOSCOPY;  Service: Cardiovascular;  Laterality: N/A;    There were no vitals filed for this visit.   Subjective Assessment - 06/30/20 1838    Subjective  Patient inquiring if he could get a massage (if it would be safe to do so)    Patient is accompanied by: Family member    Pertinent History CVA 01/07/20 w/ residual Lt hemiparesis. PMH: HTN, ETOH abuse, smoker    Limitations fall risk    Currently in Pain? Yes    Pain Score 3     Pain Location Shoulder    Pain Orientation Left    Pain Descriptors / Indicators Aching    Pain Type Chronic pain    Pain  Onset More than a month ago    Pain Frequency Intermittent    Aggravating Factors  range of motion    Pain Relieving Factors rest, medication, gentle stretching, scap mobs           Patient seen for aquatic OT today.  Patient less anxious, this is second pool session.   Patient entered and exited the pool via ramp and min assist using right hand rail.   Patient wore left ankle air cast to reduce supination during walking.   Worked in seated position to prepare foot first, mid foot and ankle stretch.  Followed with sit to stand from built in seat.  Patient initially unable to stand due to left leg floating to surface.  Provided support and facilitation to activate left leg to push into surface with subsequent sit to stands with improved activation.   Supported supine position to address body on arm motion.  Working to reduce head/neck/trunk/hip tension and gradually increase shoulder range of motion.  Provided scapular mobilization without report of discomfort.                       OT Education - 06/30/20 1840    Education Details impact of water on hemiplegic limbs    Person(s) Educated  Patient    Methods Explanation;Demonstration    Comprehension Verbalized understanding;Need further instruction            OT Short Term Goals - 06/30/20 1844      OT SHORT TERM GOAL #1   Title Independent with HEP for LUE    Time 4    Period Weeks    Status On-going      OT SHORT TERM GOAL #2   Title Pt to be independent with splint wear and care prn    Time 4    Period Weeks    Status On-going      OT SHORT TERM GOAL #3   Title Pt to verbalize understanding with pain management strategies and proper positioning of LUE    Time 4    Period Weeks    Status On-going      OT SHORT TERM GOAL #4   Title Pt to bathe self with min assist seated    Baseline mod assist seated    Time 4    Period Weeks    Status On-going      OT SHORT TERM GOAL #5   Title Pt to perform UE  dressing with min assist and LE dressing with mod assist    Baseline mod assist/max assist    Time 4    Period Weeks    Status On-going             OT Long Term Goals - 06/30/20 1845      OT LONG TERM GOAL #1   Title Pt to tolerate 75% PROM LUE with pain 3/10 or under    Time 8    Period Weeks    Status On-going      OT LONG TERM GOAL #2   Title Pt to use LUE as stabalizer for bilateral tasks    Time 8    Status On-going      OT LONG TERM GOAL #3   Title Pt to verbalize understanding with potential A/E needs and task modifications to increase ease with ADLS and simple IADLS    Period Weeks    Status On-going      OT LONG TERM GOAL #4   Title Pt to be mod I level with bathing and UE dressing and min assist for LE dressing    Time 8    Period Weeks    Status On-going      OT LONG TERM GOAL #5   Title Pt to perform toileting at mod I level    Time 8    Period Weeks    Status On-going      OT LONG TERM GOAL #6   Title Pt to perform simple snack prep/sandwich prep from w/c level prn or w/ use of walker and walker tray    Time 8    Period Weeks                 Plan - 06/30/20 1842    Clinical Impression Statement Patient is responding well to clinic and water therapy, but there has been limited carryover to home environment, so from session to session there is not significant progress.    OT Occupational Profile and History Detailed Assessment- Review of Records and additional review of physical, cognitive, psychosocial history related to current functional performance    Occupational performance deficits (Please refer to evaluation for details): ADL's;IADL's;Rest and Sleep;Leisure;Social Participation    Body Structure / Function / Physical Skills ADL;ROM;Dexterity;Edema;Balance;IADL;Body mechanics;Improper  spinal/pelvic alignment;Sensation;Mobility;Strength;Muscle spasms;Coordination;Tone;Pain;UE functional use;Decreased knowledge of use of DME;Vision    Rehab  Potential Fair    Clinical Decision Making Several treatment options, min-mod task modification necessary    Comorbidities Affecting Occupational Performance: May have comorbidities impacting occupational performance    Modification or Assistance to Complete Evaluation  No modification of tasks or assist necessary to complete eval    OT Frequency 2x / week    OT Duration 8 weeks    OT Treatment/Interventions Self-care/ADL training;Therapeutic exercise;Functional Mobility Training;Aquatic Therapy;Neuromuscular education;Manual Therapy;Splinting;Therapeutic activities;Coping strategies training;DME and/or AE instruction;Cognitive remediation/compensation;Electrical Stimulation;Visual/perceptual remediation/compensation;Moist Heat;Passive range of motion;Patient/family education;Ultrasound    Plan aquatic therapy, continue NMR and pain management, need recert to extend end date    OT Home Exercise Plan Initiated supine body on arm with partial rolling - girlfriend not present    Consulted and Agree with Plan of Care Patient           Patient will benefit from skilled therapeutic intervention in order to improve the following deficits and impairments:   Body Structure / Function / Physical Skills: ADL, ROM, Dexterity, Edema, Balance, IADL, Body mechanics, Improper spinal/pelvic alignment, Sensation, Mobility, Strength, Muscle spasms, Coordination, Tone, Pain, UE functional use, Decreased knowledge of use of DME, Vision       Visit Diagnosis: 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 Neuropathic pain 04/09/2020  . Pain   . Spastic hemiparesis (HCC)   . History of hypertension   . Dyslipidemia   . 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  Leukocytosis 01/12/2020  . Cerebrovascular accident (CVA) due to thrombosis of right middle cerebral artery (HCC)   . Chronic pain syndrome   . Hypokalemia   . Dysphagia, post-stroke   . 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 Tobacco dependence   . Marijuana dependence (HCC)   . Alcohol dependence [facility])     Collier Salina 06/30/2020, 6:46 PM  [facility] [address] [place], [place], [address] Phone: [phone]   Fax:  [phone]  Name: Derek [name] MRN: [id number] Date of Birth: 01-21-1976

## 2020-07-02 ENCOUNTER — Ambulatory Visit: Payer: BC Managed Care – PPO | Attending: Physical Medicine & Rehabilitation | Admitting: Physical Therapy

## 2020-07-02 ENCOUNTER — Encounter: Payer: Self-pay | Admitting: Physical Therapy

## 2020-07-02 ENCOUNTER — Other Ambulatory Visit: Payer: Self-pay

## 2020-07-02 DIAGNOSIS — R209 Unspecified disturbances of skin sensation: Secondary | ICD-10-CM | POA: Insufficient documentation

## 2020-07-02 DIAGNOSIS — R2689 Other abnormalities of gait and mobility: Secondary | ICD-10-CM | POA: Diagnosis not present

## 2020-07-02 DIAGNOSIS — R2681 Unsteadiness on feet: Secondary | ICD-10-CM | POA: Diagnosis not present

## 2020-07-02 DIAGNOSIS — I69354 Hemiplegia and hemiparesis following cerebral infarction affecting left non-dominant side: Secondary | ICD-10-CM | POA: Insufficient documentation

## 2020-07-02 DIAGNOSIS — M6281 Muscle weakness (generalized): Secondary | ICD-10-CM | POA: Diagnosis not present

## 2020-07-02 DIAGNOSIS — M79602 Pain in left arm: Secondary | ICD-10-CM | POA: Insufficient documentation

## 2020-07-02 NOTE — Therapy (Signed)
[facility] [address] [place], [place], [address] Phone: [phone]   Fax:  [phone]  Physical Therapy Treatment  Patient Details  Name: Derek [name] MRN: [id number] Date of Birth: 06-Sep-1976 Referring Provider (PT): Faith Rogue, MD   Encounter Date: 07/02/2020   PT End of Session - 07/02/20 1639    Visit Number 15    Number of Visits 26    Date for PT Re-Evaluation 09/24/20   POC for 6 weeks, Cert for 90 days   Authorization Type BCBS    PT Start Time 1532    PT Stop Time 1615    PT Time Calculation (min) 43 min    Equipment Utilized During Treatment Gait belt    Activity Tolerance Patient tolerated treatment well    Behavior During Therapy [facility] for tasks assessed/performed;Flat affect           Past Medical History:  Diagnosis Date   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   Subjective Assessment - 07/02/20 1536    Subjective Likes being in the pool. No falls.    Pertinent History Alcohol and Tobacco Use    Limitations Standing;Walking;Sitting    How long can you stand comfortably? 3-5 minutes    Currently in Pain? Yes    Pain Score 4     Pain Location Shoulder    Pain Orientation Left    Pain Descriptors / Indicators Aching    Pain Type Chronic pain    Pain Onset More than a month ago                             [facility]  PT Treatment/Exercise - 07/02/20 0001      Ambulation/Gait   Ambulation/Gait Yes    Ambulation/Gait Assistance 4: Min guard    Ambulation/Gait Assistance Details With no AFO pt demonstrating decr L ankle DF and foot flat contact with decr hip/knee flexion during gait. Trialed 4 different AFOs: Ott[place]ock walk on, Thuasne spry step and spry step plus, and the anterior blue rocker AFO. With no noted improvement with heel strike and ankle DF with all 4 braces, pt continuing with foot flat contact with LLE. With use of Ottobock walk on and Thuasne spry step, pt demonstrating more of an almost mild steppage gait with LLE to clear foot. Pt reporting with the anterior blue rocker AFO that he feels that his foot stays on the ground for longer periods of time. Throughout gait, intermittently therapist providing assist for incr weight shift towards LLE for incr step length with RLE.     Ambulation Distance (Feet) 230 Feet   total distance throughout while trialing AFOs   Assistive device Hemi-walker    Gait Pattern Step-through pattern;Decreased step length - left;Decreased stance time - left;Decreased step length - right;Decreased hip/knee flexion - left;Decreased dorsiflexion - left    Ambulation  Surface Level;Indoor                  PT Education - 07/02/20 1639    Education Details contacting orthotist to come to future session to assess for proper AFO for LLE.    Person(s) Educated Patient    Methods Explanation    Comprehension Verbalized understanding            PT Short Term Goals - 06/26/20 1946      PT SHORT TERM GOAL #1   Title Patient will report completing walking daily at home 2x/day (ALL STGs Due: 07/24/2020)    Baseline not completing daily    Time 3    Period Weeks    Status New    Target Date 07/24/20      PT SHORT TERM GOAL #2   Time --             PT Long Term Goals - 06/26/20 1622      PT LONG TERM GOAL #1   Title Patient will be independent and complaint  with Final HEP and walking program with caregiver assistance (ALL LTGs: 08/07/20)    Baseline Patient reports completing HEP 1x/week, patient reports difficulty getting assistance to complete exercises.    Time 6    Period Weeks    Status Revised    Target Date 08/07/20      PT LONG TERM GOAL #2   Title Patient will demonstrate ability to ambulate > 500 ft w/ LRAD and Supv to demo improved community mobility    Baseline 400 ft w/ hemiwalker CGA    Time 6    Period Weeks    Status Revised      PT LONG TERM GOAL #3   Title Patient will improve TUG to </= 25 secs w/ LRAD to demo reduced risk for falls    Baseline 58.66 secs w/ hemiwalker, 31.19 secs w/ hemiwalker    Time 6    Period Weeks    Status Revised      PT LONG TERM GOAL #4   Title Patient will demo ability to ambulate at gait speed of >/= 2.0 ft/sec to demo improved functional mobility    Baseline 1.26 ft/sec    Time 8    Period Weeks    Status On-going      PT LONG TERM GOAL #5   Title Patient will demo ability to complete all bed mobility and transfers, Mod I, for improved independence    Baseline CGA for bed mobility and transfers    Time 8    Period Weeks    Status On-going                 Plan - 07/02/20 1640    Clinical Impression Statement Trialed different AFOs today for LLE: Ottobock walk on, Thuasne spry step and spry step plus, and the anterior blue rocker AFO. PT reported increased discomfort due to the strut on both of the Thuasne braces. With all 4 braces pt still demonstrating foot flat contact vs. heel strike during gait. Throughout assessment of AFOs, therapist facilitating weight shift to LLE for incr RLE step length. Therapist to reach out to orthotist to come to future session for further assessment. Will continue to progress towards LTGs.    Personal Factors and Comorbidities Comorbidity 2    Comorbidities Chronic Back Pain,HTN, Prior History of Alcohol/Tobacco Use    Examination-Activity  Limitations Bed Mobility;Stairs;Stand;Toileting;Transfers;Dressing    Examination-Participation Restrictions Community Activity;Driving;[employer]  Work;Other   Occupation   Conservation officer, historic buildings Evolving/Moderate complexity    Rehab Potential Good    PT Frequency 2x / week    PT Duration 6 weeks    PT Treatment/Interventions ADLs/Self Care Home Management;Electrical Stimulation;Aquatic Therapy;Moist Heat;DME Instruction;Gait training;Stair training;Functional mobility training;Therapeutic activities;Therapeutic exercise;Balance training;Neuromuscular re-education;Patient/family education;Orthotic Fit/Training;Wheelchair mobility training;Manual techniques;Passive range of motion    PT Next Visit Plan can chris come to future session? continue to trial AFOs. stretching of LLE, standing weight bearing.    Consulted and Agree with Plan of Care Patient           Patient will benefit from skilled therapeutic intervention in order to improve the following deficits and impairments:  Abnormal gait, Decreased balance, Decreased endurance, Decreased mobility, Difficulty walking, Impaired tone, Impaired sensation, Pain, Impaired UE functional use, Impaired flexibility, Decreased strength, Decreased knowledge of use of DME, Decreased activity tolerance, Decreased coordination, Decreased range of motion, Increased muscle spasms  Visit Diagnosis: Spastic hemiplegia of left nondominant 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 (HCC)  Muscle weakness (generalized)  Unsteadiness on feet  Other abnormalities of gait and mobility     Problem List Patient Active Problem List   Diagnosis Date Noted   Neuropathic pain 04/09/2020   Pain    Spastic hemiparesis (HCC)    History of hypertension    Dyslipidemia    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

## 2020-07-03 ENCOUNTER — Ambulatory Visit (INDEPENDENT_AMBULATORY_CARE_PROVIDER_SITE_OTHER): Payer: BC Managed Care – PPO | Admitting: Adult Health

## 2020-07-03 ENCOUNTER — Encounter: Payer: Self-pay | Admitting: Adult Health

## 2020-07-03 VITALS — BP 124/78 | HR 71 | Ht 71.0 in | Wt 223.0 lb

## 2020-07-03 DIAGNOSIS — G8110 Spastic hemiplegia affecting unspecified side: Secondary | ICD-10-CM | POA: Diagnosis not present

## 2020-07-03 DIAGNOSIS — E785 Hyperlipidemia, unspecified: Secondary | ICD-10-CM | POA: Diagnosis not present

## 2020-07-03 DIAGNOSIS — I10 Essential (primary) hypertension: Secondary | ICD-10-CM | POA: Diagnosis not present

## 2020-07-03 DIAGNOSIS — I639 Cerebral infarction, unspecified: Secondary | ICD-10-CM | POA: Diagnosis not present

## 2020-07-03 MED ORDER — TIZANIDINE HCL 2 MG PO TABS: 3.0000 mg | ORAL_TABLET | Freq: Three times a day (TID) | ORAL | 0 refills | Status: DC

## 2020-07-03 NOTE — Progress Notes (Signed)
I agree with the above plan 

## 2020-07-03 NOTE — Patient Instructions (Signed)
Increase tizanidine to 3mg  three times daily (you will take 1.5 tabs 3 times daily)  Continue to follow with Dr. for ongoing pain and spasticity management  Continue to work with PT/OT for ongoing improvement   Continue aspirin 81 mg daily  and lipitor  for secondary stroke prevention  Continue to follow up with PCP regarding cholesterol and blood pressure management  Maintain strict control of hypertension with blood pressure goal below 130/90, diabetes with hemoglobin A1c goal below 6.5% and cholesterol with LDL cholesterol (bad cholesterol) goal below 70 mg/dL.       Followup in the future with me in 4 months or call earlier if needed       Thank you for coming to see Derek [name] at [facility]. I hope we have been able to provide you high quality care today.  You may receive a patient satisfaction survey over the next few weeks. We would appreciate your feedback and comments so that we may continue to improve ourselves and the health of our patients.

## 2020-07-03 NOTE — Progress Notes (Signed)
[facility] [address] [place]. [place] [address] [phone]       STROKE FOLLOW UP NOTE  [name] Date of Birth:  August 21, 1976 Medical Record Number:  [id number]   Reason for Referral: stroke follow up    SUBJECTIVE:   CHIEF COMPLAINT:  Chief Complaint  Patient presents with  . Follow-up    rm 5  . Cerebrovascular Accident    Pt has noxnew sx. Pt is with his brother Derek [name].    HPI:   Today, 07/03/2020, Derek [name] returns for stroke follow-up unaccompanied His brother assisted him to today's appointment but is waiting in car  Patient reports residual deficits left spastic hemiparesis and dysarthria Main concern is left shoulder pain -received injection on 04/09/2020 by Dr. Riley Kill with patient reporting some improvement but he is frustrated as he has difficulty using RW due to shoulder pain Continues to participate in neuro rehab PT/OT with improvement of LLE and currently awaiting AFO brace due to left foot drop. Able to ambulate short distance with hemiwalker Received first round of Botox on 06/18/2020 by Dr. Riley Kill in L arm for spasticity He also remains on baclofen 20 mg 3 times daily, tizanidine 2 mg 3 times daily, pregabalin 50 mg 3 times daily, oxycodone 5 mg daily as needed and lidocaine patch managed by PMR Denies issues with depression or anxiety Denies new stroke/TIA symptoms  Reports compliance with aspirin 325 mg daily and atorvastatin 40 mg daily and denies side effects such as bleeding or bruising or myalgias Blood pressure today 124/78  Previously discussed completing cardiac monitor but per patient, " they wanted to charge [place] extra money so we returned it". He is not able to tell me any additional information such as recently of extra charge  No further concerns at this time     History provided for reference purposes only Initial visit 03/18/2020 JM: Derek [name] is being seen for hospital follow-up accompanied by his fiance.   He was discharged home on 02/13/2020 from CIR where he continues to receive home health therapy.  Residual deficits of left spastic hemiparesis, left facial droop and mild dysarthria.  Complaints of ongoing left shoulder pain currently being managed by PMR on oxycodone, tizanidine, baclofen and lidocaine.  Pain throbbing sensation and denies numbness/tingling, burning or sharp electrical pain.  He does endorse ongoing improvement and is able to ambulate short distance with hemiwalker.  Currently awaiting AFO brace.  Continues on aspirin 325 mg daily and atorvastatin 40 mg daily for secondary stroke prevention without side effects.  Blood pressure today 115/85.  Cardiac monitor in position but fianc had difficulty setting up therefore has not started.  He has not returned back to work currently working as a Games developer is currently pursuing Social Security disability as he did not have any disability through prior employer.  Endorses ongoing tobacco use has been slowly decreasing.  No concerns at this time.  Stroke admission 01/07/2020 [name] is a 44 y.o. male with history of tobacco, marijuana and alcohol use with limited healthcare presented on 01/07/2020 following frequent falls over the weekend with L sided hemiparesis, R gaze deviation, HA and incontinence.  Stroke work-up revealed right MCA infarct with hemorrhagic transformation, infarct embolic pattern secondary to unknown source.  Recommended 30-day cardiac event monitor outpatient to rule out atrial fibrillation.  Initiated aspirin 325 mg daily for secondary stroke prevention.  DAPT not recommended given hemorrhagic transformation.  BP elevated on admission  189/106 and initiated amlodipine 10 mg daily with SBP goal <160 given hemorrhagic conversion.  LDL 138 initiate atorvastatin 40 mg daily.  Other stroke risk factors include THC positive on UDS, alcohol abuse on CIWA protocol, and tobacco use but no prior stroke  history.  Hospital course complicated by cerebral edema with evidence of improvement from follow-up imaging without need of treatment and SIRS with fever and leukocytosis.  Residual deficits of left hemiparesis, left facial droop and dysphagia with therapies recommended discharge to CIR for ongoing therapy needs.  Stroke:   R MCA infarct w/ hemorrhagic transformation, infarct embolic pattern, secondary to unknown source  CT head recent R MCA M1 infarct w/ mass effect and petechial hemorrhage in R basal ganglia. Infarct extends to R frontal and R parietal lobes. Likely thrombus R M1.  MRI  R MCA infarct R basal ganglia, R parietal lobe. Hemorrhagic transformation in R basal ganglia w/ mass effect on lateral ventricle.   MRA  R M1 patent. Moderate R M2 stenosis.  CTA neck unremarkable  CT repeated 3/10 evolving right MCA infarct with hemorrhagic transformation, 3 mm right-to-left shift  2D Echo EF 60-65%. No source of embolus   LE doppler no DVT  TEE no PFO, no SOE  Recommend 30-day CardioNet monitor as outpatient to rule out A. fib  Hypercoagulable labs neg  LDL 138  HgbA1c 5.6  Lovenox for VTE prophylaxis as HT stable now  No antithrombotic prior to admission, now on aspirin 325 mg daily. Hold DAPT at this time given hemorrhagic transformation   Therapy recommendations:  CIR  Disposition:  CIR      ROS:   14 system review of systems performed and negative with exception of pain, weakness, gait impairment and speech impairment  PMH:  Past Medical History:  Diagnosis Date  . 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 Alcohol dependence (HCC)   . Back pain   . Marijuana dependence (HCC)   . Tobacco dependence     PSH:  Past Surgical History:  Procedure Laterality Date  . BUBBLE STUDY  01/10/2020   Procedure: BUBBLE STUDY;  Surgeon: Sande Rives, MD;  Location: [facility] ENDOSCOPY;  Service: Cardiovascular;;  . TEE WITHOUT CARDIOVERSION N/A 01/10/2020    Procedure: TRANSESOPHAGEAL ECHOCARDIOGRAM (TEE);  Surgeon: Sande Rives, MD;  Location: [facility] ENDOSCOPY;  Service: Cardiovascular;  Laterality: N/A;    Social History:  Social History   Socioeconomic History  . Marital status: Single    Spouse name: Not on file  . Number of children: Not on file  . Years of education: Not on file  . Highest education level: Not on file  Occupational History  . Occupation: truck Hospital doctor  Tobacco Use  . Smoking status: Current Every Day Smoker    Packs/day: 0.20    Types: Cigarettes  . Smokeless tobacco: Never Used  Vaping Use  . Vaping Use: Never used  Substance and Sexual Activity  . Alcohol use: Yes    Comment: 2-3 times a week.   . Drug use: Yes    Types: Marijuana  . Sexual activity: Yes    Birth control/protection: Condom  Other Topics Concern  . Not on file  Social History Narrative  . Not on file   Social Determinants of Health   Financial Resource Strain:   . Difficulty of Paying Living Expenses: Not on file  Food Insecurity:   . Worried About Programme researcher, broadcasting/film/video in the Last Year: Not on file  .  Ran Out of Food in the Last Year: Not on file  Transportation Needs:   . Lack of Transportation (Medical): Not on file  . Lack of Transportation (Non-Medical): Not on file  Physical Activity:   . Days of Exercise per Week: Not on file  . Minutes of Exercise per Session: Not on file  Stress:   . Feeling of Stress : Not on file  Social Connections:   . Frequency of Communication with Friends and Family: Not on file  . Frequency of Social Gatherings with Friends and Family: Not on file  . Attends Religious Services: Not on file  . Active Member of Clubs or Organizations: Not on file  . Attends Banker Meetings: Not on file  . Marital Status: Not on file  Intimate Partner Violence:   . Fear of Current or Ex-Partner: Not on file  . Emotionally Abused: Not on file  . Physically Abused: Not on file  . Sexually  Abused: Not on file    Family History:  Family History  Problem Relation Age of Onset  . Stroke Neg Hx   . CAD Neg Hx     Medications:   Current Outpatient Medications on File Prior to Visit  Medication Sig Dispense Refill  . aspirin 325 MG tablet Take 1 tablet (325 mg total) by mouth daily. 30 tablet 0  . atorvastatin (LIPITOR) 80 MG tablet Take 1 tablet by mouth daily.    . baclofen (LIORESAL) 20 MG tablet Take 1 tablet (20 mg total) by mouth 3 (three) times daily. 90 each 3  . ferrous sulfate 325 (65 FE) MG tablet Take 325 mg by mouth 2 (two) times daily.  2  . folic acid (FOLVITE) 1 MG tablet Take 1 tablet (1 mg total) by mouth daily. 30 tablet 5  . lidocaine (LIDODERM) 5 % Place 1 patch onto the skin daily. Remove & Discard patch within 12 hours or as directed by MD 30 patch 0  . metoprolol tartrate (LOPRESSOR) 25 MG tablet Take 1 tablet (25 mg total) by mouth 2 (two) times daily. 60 tablet 11  . Multiple Vitamin (MULTIVITAMIN WITH MINERALS) TABS tablet Take 1 tablet by mouth daily. 30 tablet 0  . oxyCODONE (OXY IR/ROXICODONE) 5 MG immediate release tablet Take 1 tablet (5 mg total) by mouth daily as needed for moderate pain. 30 tablet 0  . pregabalin (LYRICA) 50 MG capsule Take 1 capsule (50 mg total) by mouth 3 (three) times daily. 90 capsule 4  . acetaminophen (TYLENOL) 325 MG tablet Take 2 tablets (650 mg total) by mouth every 4 (four) hours as needed for mild pain (or temp > 37.5 C (99.5 F)).     No current facility-administered medications on file prior to visit.    Allergies:  No Known Allergies    OBJECTIVE:  Physical Exam  Vitals:   07/03/20 1536  BP: 124/78  Pulse: 71  Weight: 223 lb (101.2 kg)  Height: 5\' 11"  (1.803 m)   Body mass index is 31.1 kg/m. No exam data present   General: well developed, well nourished, pleasant middle-aged African-American male, seated, in no evident distress Head: head normocephalic and atraumatic.   Neck: supple with no  carotid or supraclavicular bruits Cardiovascular: regular rate and rhythm, no murmurs Musculoskeletal: no deformity Skin:  no rash/petichiae Vascular:  Normal pulses all extremities   Neurologic Exam Mental Status: Awake and fully alert. Mild dysarthria. Oriented to place and time. Recent and remote memory intact. Attention span,  concentration and fund of knowledge appropriate. Mood and affect flat but appropriate.  Cranial Nerves: Pupils equal, briskly reactive to light. Extraocular movements full without nystagmus. Visual fields full to confrontation. Hearing intact. Facial sensation intact. Face lower facial weakness. tongue, and palate moves normally and symmetrically.  Motor: Full strength right upper and lower extremity LUE: 0-1/5 grader proximal and distal with increased tone; left shoulder pain with PROM LLE: 3/5 hip flexion and knee extension, 2/5 knee flexion and foot drop (1/5 plantar and dorsiflexion) and increased tone throughout Sensory.: Subjectively reports hyper sensation with light touch right upper and lower extremity Coordination: Rapid alternating movements normal on right side. Finger-to-nose and heel-to-shin performed accurately on right side. Gait and Station: Deferred as Comptrollerhemiwalker not present during visit Reflexes: Brisk on left side, 1+ right side. Toes downgoing.       ASSESSMENT: Derek [name] is a 44 y.o. year old male presented with left-sided hemiparesis, right gaze deviation, headache and incontinence on 01/07/2020 which stroke work-up revealing right MCA infarct with hemorrhagic transformation, infarct embolic pattern secondary to unknown source.  Hypercoagulable work-up negative.  Recommended 30-day cardiac event monitor.  Vascular risk factors include tobacco use, substance abuse history, HTN, and HLD.       PLAN:  1. Right MCA stroke, cryptogenic:  a. Residual deficits: Spastic left hemiparesis, dysarthria and left facial weakness.   i. Ongoing  participation in outpatient PT/OT for hopeful ongoing improvement ii. Continue to follow with Dr. Riley KillSwartz for left spasticity and Botox injections. Advised patient that he may need 2 to 3 rounds of Botox injections before seeing any benefit. iii. Recommend increasing tizanidine to 3mg  (1.5 tab) 3 times daily for continued spasticity pain and shoulder pain. He will continue baclofen 20 mg 3 times daily, pregabalin 50 mg 3 times daily, oxycodone 5 mg daily as needed and lidocaine patch managed by PMR iv. Concern for depression as today's visit with flat affect and limited input. He denies depression or anxiety symptoms but this will need to be monitored ongoing b. Cardiac monitor not completed due to patient reporting increased cost. Will attempt to reach out to cardiology to discuss this concern further with fianc/patient due to importance of completing monitoring in setting of cryptogenic stroke c. Continue aspirin 325 mg daily  and atorvastatin for secondary stroke prevention.  d. Discussed importance of close PCP follow-up for aggressive stroke risk factor management 2. HTN:  a. BP goal <130/90.   b. Stable today.  c.  Continues on metoprolol 25 mg twice daily.   d. Managed by PCP. 3. HLD: a.  LDL goal<70.  b.  Remains on atorvastatin 80 mg daily.   c. Managed and prescribed by PCP.     Follow up in 4 months or call earlier if needed   I spent 30 minutes of face-to-face and non-face-to-face time with patient.  This included previsit chart review, lab review, study review, order entry, electronic health record documentation, patient education regarding prior stroke, residual deficits including left shoulder pain and continued spasticity, indication for cardiac monitoring, importance of managing stroke risk factors and answered all questions to patient satisfaction   Ihor AustinJessica McCue, AGNP-BC  [facility] [address] [place], [place] [id number]  Phone  [phone] Fax [phone] Note: This document was prepared with digital dictation and possible smart phrase technology. Any transcriptional errors that result from this process are unintentional.

## 2020-07-10 ENCOUNTER — Ambulatory Visit: Payer: BC Managed Care – PPO | Admitting: Physical Therapy

## 2020-07-10 ENCOUNTER — Other Ambulatory Visit: Payer: Self-pay

## 2020-07-10 ENCOUNTER — Encounter: Payer: Self-pay | Admitting: Physical Therapy

## 2020-07-10 DIAGNOSIS — I69354 Hemiplegia and hemiparesis following cerebral infarction affecting left non-dominant side: Secondary | ICD-10-CM

## 2020-07-10 DIAGNOSIS — R2681 Unsteadiness on feet: Secondary | ICD-10-CM | POA: Diagnosis not present

## 2020-07-10 DIAGNOSIS — R2689 Other abnormalities of gait and mobility: Secondary | ICD-10-CM

## 2020-07-10 DIAGNOSIS — I63511 Cerebral infarction due to unspecified occlusion or stenosis of right middle cerebral artery: Secondary | ICD-10-CM | POA: Diagnosis not present

## 2020-07-10 DIAGNOSIS — M6281 Muscle weakness (generalized): Secondary | ICD-10-CM | POA: Diagnosis not present

## 2020-07-10 DIAGNOSIS — R209 Unspecified disturbances of skin sensation: Secondary | ICD-10-CM | POA: Diagnosis not present

## 2020-07-10 DIAGNOSIS — M79602 Pain in left arm: Secondary | ICD-10-CM | POA: Diagnosis not present

## 2020-07-10 NOTE — Therapy (Signed)
[facility] [address] [place], [place], [address] Phone: [phone]   Fax:  [phone]  Physical Therapy Treatment  Patient Details  Name: Derek [name] MRN: [id number] Date of Birth: Mar 04, 1976 Referring Provider (PT): Faith Rogue, MD   Encounter Date: 07/10/2020   PT End of Session - 07/10/20 1702    Visit Number 16    Number of Visits 26    Date for PT Re-Evaluation 09/24/20   POC for 6 weeks, Cert for 90 days   Authorization Type BCBS    PT Start Time 1317    PT Stop Time 1401    PT Time Calculation (min) 44 min    Equipment Utilized During Treatment Gait belt    Activity Tolerance Patient tolerated treatment well    Behavior During Therapy [facility] for tasks assessed/performed;Flat affect           Past Medical History:  Diagnosis Date   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/07/2020   Alcohol dependence (HCC)    Back pain    Marijuana dependence (HCC)    Tobacco dependence     Past Surgical History:  Procedure Laterality Date   BUBBLE STUDY  01/10/2020   Procedure: BUBBLE STUDY;  Surgeon: Sande Rives, MD;  Location: [facility] ENDOSCOPY;  Service: Cardiovascular;;   TEE WITHOUT CARDIOVERSION N/A 01/10/2020   Procedure: TRANSESOPHAGEAL ECHOCARDIOGRAM (TEE);  Surgeon: Sande Rives, MD;  Location: [facility] ENDOSCOPY;  Service: Cardiovascular;  Laterality: N/A;    There were no vitals filed for this visit.   Subjective Assessment - 07/10/20 1321    Subjective No changes, no falls. Both knees are feeling sore, made worse with walking.    Pertinent History Alcohol and Tobacco Use    Limitations Standing;Walking;Sitting    How long can you stand comfortably? 3-5 minutes    Currently in Pain? No/denies   "just stiffness"   Pain Onset More than a month ago                             [facility] Adult PT Treatment/Exercise - 07/10/20 0001      Ambulation/Gait    Ambulation/Gait Yes    Ambulation/Gait Assistance 4: Min guard;5: Supervision    Ambulation/Gait Assistance Details Orthotist present at start of session to assess pt's gait, pt with incr LLE tone and decr L knee flexion as well as heel strike for initial contact, pt clears foot using L hip flexion and has L foot flat contact. Orthotist not recommending bracing at this time - recommending L foot up brace, esp when pt's L hip flexors get fatigued. Trialed one during session after standing activities, with pt demonstrating improved LLE foot clearance and less foot flat contact as well as improved stride length and incr stance time on LLE. Showed pt where to purchase off amaz[employer].     Ambulation Distance (Feet) 345 Feet    Assistive device Hemi-walker    Gait Pattern Step-through pattern;Decreased step length - left;Decreased stance time - left;Decreased step length - right;Decreased hip/knee flexion - left;Decreased dorsiflexion - left    Ambulation Surface Level;Indoor      Neuro Re-ed    Neuro Re-ed Details  with RUE support on hemiwalker: 2 x 10 reps standing L hip flexion to 4" step, therapist providing assist for additional knee flexion when performing  PT Education - 07/10/20 1700    Education Details where to purchase foot up brace for gait (provided pt link from [employer])    Person(s) Educated Patient    Methods Explanation;Handout    Comprehension Verbalized understanding            PT Short Term Goals - 06/26/20 1946      PT SHORT TERM GOAL #1   Title Patient will report completing walking daily at home 2x/day (ALL STGs Due: 07/24/2020)    Baseline not completing daily    Time 3    Period Weeks    Status New    Target Date 07/24/20      PT SHORT TERM GOAL #2   Time --             PT Long Term Goals - 06/26/20 1622      PT LONG TERM GOAL #1   Title Patient will be independent and complaint with Final HEP and walking program with caregiver  assistance (ALL LTGs: 08/07/20)    Baseline Patient reports completing HEP 1x/week, patient reports difficulty getting assistance to complete exercises.    Time 6    Period Weeks    Status Revised    Target Date 08/07/20      PT LONG TERM GOAL #2   Title Patient will demonstrate ability to ambulate > 500 ft w/ LRAD and Supv to demo improved community mobility    Baseline 400 ft w/ hemiwalker CGA    Time 6    Period Weeks    Status Revised      PT LONG TERM GOAL #3   Title Patient will improve TUG to </= 25 secs w/ LRAD to demo reduced risk for falls    Baseline 58.66 secs w/ hemiwalker, 31.19 secs w/ hemiwalker    Time 6    Period Weeks    Status Revised      PT LONG TERM GOAL #4   Title Patient will demo ability to ambulate at gait speed of >/= 2.0 ft/sec to demo improved functional mobility    Baseline 1.26 ft/sec    Time 8    Period Weeks    Status On-going      PT LONG TERM GOAL #5   Title Patient will demo ability to complete all bed mobility and transfers, Mod I, for improved independence    Baseline CGA for bed mobility and transfers    Time 8    Period Weeks    Status On-going                 Plan - 07/10/20 1703    Clinical Impression Statement Orthotist present at beginning of session - not recommending an AFO at this time. Due to pt's L hip flexor weakness (esp when fatigued), orthotist recommending L foot up brace for improved foot clearance. Trialed in session with pt demonstrating improved RLE step length and improved LLE foot clearance, esp when pt reporting feeling more tired. Subjectively, pt also likes how it feels. Showed pt where to purchase on [employer].    Personal Factors and Comorbidities Comorbidity 2    Comorbidities Chronic Back Pain,HTN, Prior History of Alcohol/Tobacco Use    Examination-Activity Limitations Bed Mobility;Stairs;Stand;Toileting;Transfers;Dressing    Examination-Participation Restrictions Community Activity;Driving;Yard  Work;Other   Occupation   Stability/Clinical Decision Making Evolving/Moderate complexity    Rehab Potential Good    PT Frequency 2x / week    PT Duration 6 weeks    PT  Treatment/Interventions ADLs/Self Care Home Management;Electrical Stimulation;Aquatic Therapy;Moist Heat;DME Instruction;Gait training;Stair training;Functional mobility training;Therapeutic activities;Therapeutic exercise;Balance training;Neuromuscular re-education;Patient/family education;Orthotic Fit/Training;Wheelchair mobility training;Manual techniques;Passive range of motion    PT Next Visit Plan did pt get foot up brace? if not continue with clinic foot up brace with gait. stretching of LLE, pt wants to try NuStep again, standing weight bearing.    Consulted and Agree with Plan of Care Patient           Patient will benefit from skilled therapeutic intervention in order to improve the following deficits and impairments:  Abnormal gait, Decreased balance, Decreased endurance, Decreased mobility, Difficulty walking, Impaired tone, Impaired sensation, Pain, Impaired UE functional use, Impaired flexibility, Decreased strength, Decreased knowledge of use of DME, Decreased activity tolerance, Decreased coordination, Decreased range of motion, Increased muscle spasms  Visit Diagnosis: Muscle weakness (generalized)  Other abnormalities of gait and mobility  Unsteadiness on feet  Spastic hemiplegia of left nondominant 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 [facility])     Problem List Patient Active Problem List   Diagnosis Date Noted   Neuropathic pain 04/09/2020   Pain    Spastic hemiparesis (HCC)    History of hypertension    Dyslipidemia    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   Leukocytosis 01/12/2020   Cerebrovascular accident (CVA) due to thrombosis of right middle cerebral artery (HCC)    Chronic pain syndrome    Hypokalemia    Dysphagia, post-stroke    Acute ischemic right MCA  stroke (HCC) 01/07/2020   Tobacco dependence    Marijuana dependence (HCC)    Alcohol dependence (HCC)     Drake Leach, PT, DPT  07/10/2020, 5:09 PM  [facility] [address] [place], [place], [address] Phone: [phone]   Fax:  [phone]  Name: AUDLEY HINOJOS MRN: [id number] Date of Birth: 10/12/1976

## 2020-07-14 ENCOUNTER — Ambulatory Visit: Payer: BC Managed Care – PPO | Admitting: Occupational Therapy

## 2020-07-14 ENCOUNTER — Encounter: Payer: Self-pay | Admitting: Occupational Therapy

## 2020-07-14 DIAGNOSIS — M6281 Muscle weakness (generalized): Secondary | ICD-10-CM | POA: Diagnosis not present

## 2020-07-14 DIAGNOSIS — R2681 Unsteadiness on feet: Secondary | ICD-10-CM | POA: Diagnosis not present

## 2020-07-14 DIAGNOSIS — I69354 Hemiplegia and hemiparesis following cerebral infarction affecting left non-dominant side: Secondary | ICD-10-CM | POA: Diagnosis not present

## 2020-07-14 DIAGNOSIS — R2689 Other abnormalities of gait and mobility: Secondary | ICD-10-CM | POA: Diagnosis not present

## 2020-07-14 DIAGNOSIS — R209 Unspecified disturbances of skin sensation: Secondary | ICD-10-CM | POA: Diagnosis not present

## 2020-07-14 DIAGNOSIS — M79602 Pain in left arm: Secondary | ICD-10-CM | POA: Diagnosis not present

## 2020-07-14 NOTE — Therapy (Signed)
[facility] [address] [place], [place], [address] Phone: [phone]   Fax:  [phone]  Occupational Therapy Treatment  Patient Details  Name: Derek [name] MRN: [id number] Date of Birth: 1976/03/25 Referring Provider (OT): Dr. 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   Subjective Assessment - 07/14/20 2029    Subjective  Patient reports not taking pain medication today and reports pain at rest of 3/10 in Left shoulder.    Patient is accompanied by: Family member    Pertinent History CVA 01/07/20 w/ residual Lt hemiparesis. PMH: HTN, ETOH abuse, smoker    Limitations fall risk    Currently in Pain? Yes    Pain Score 3     Pain Location Shoulder    Pain Orientation Left    Pain Descriptors / Indicators Aching    Pain Type Chronic pain    Pain  Onset More than a month ago    Pain Frequency Intermittent    Aggravating Factors  range of motion, stiffness    Pain Relieving Factors rest, medication, scap mobs            Patient seen for aquatic therapy visit today.  Patient entered and exited via ramp with min assist to address alignment and postural control with walking.  Patient's treatment session occurred in 3.5-4 feet of water.   Patient given air cast for left ankle to address alignment and safety with foot placement during walking.  Worked on sit to stand from underwater bench with emphasis on forward weight shift in midline, activation of LLE, and gentle range of motion to left UE.   In supported supine position worked on body on arm motion to address glenohumeral joint motion.  Patient able to achieve 90* of GH motion passively today, then follow with active abduction/adduction thru 1/4 range.  Patient reports decreased stiffness and pain after session today.                      OT Education - 07/14/20 2030    Education Details discussed potential benefit of routinely taking pain medication to avoid pain cycle    Person(s) Educated Patient    Methods Explanation  Comprehension Verbalized understanding;Need further instruction            OT Short Term Goals - 07/14/20 2034      OT SHORT TERM GOAL #1   Title Independent with HEP for LUE    Time 4    Period Weeks    Status On-going      OT SHORT TERM GOAL #2   Title Pt to be independent with splint wear and care prn    Time 4    Period Weeks    Status Achieved      OT SHORT TERM GOAL #3   Title Pt to verbalize understanding with pain management strategies and proper positioning of LUE    Time 4    Period Weeks    Status On-going      OT SHORT TERM GOAL #4   Title Pt to bathe self with min assist seated    Baseline mod assist seated    Time 4    Period Weeks    Status On-going      OT SHORT TERM GOAL #5   Title Pt to perform UE  dressing with min assist and LE dressing with mod assist    Baseline mod assist/max assist    Time 4    Period Weeks    Status On-going      OT SHORT TERM GOAL #6   Title Pt to tolerate passive shoulder flexion to 90* supine w/ pain 5/10 or under    Status Achieved             OT Long Term Goals - 07/14/20 2035      OT LONG TERM GOAL #1   Title Pt to tolerate 75% PROM LUE with pain 3/10 or under    Time 8    Period Weeks    Status On-going      OT LONG TERM GOAL #2   Title Pt to use LUE as stabalizer for bilateral tasks    Time 8    Period Weeks    Status On-going      OT LONG TERM GOAL #3   Title Pt to verbalize understanding with potential A/E needs and task modifications to increase ease with ADLS and simple IADLS    Time 8    Period Weeks    Status On-going      OT LONG TERM GOAL #4   Title Pt to be mod I level with bathing and UE dressing and min assist for LE dressing    Time 8    Period Weeks    Status On-going      OT LONG TERM GOAL #5   Title Pt to perform toileting at mod I level    Time 8    Period Weeks    Status Achieved      OT LONG TERM GOAL #6   Title Pt to perform simple snack prep/sandwich prep from w/c level prn or w/ use of walker and walker tray    Time 8    Period Weeks    Status On-going                 Plan - 07/14/20 2031    Clinical Impression Statement Patient needs to have caregivers train to aide with range of motion to left shoulder to prevent pain and stiffness cycle.  No family member has been able to undergo training, therefore carryover to home setting has been poor.    OT Occupational  Profile and History Detailed Assessment- Review of Records and additional review of physical, cognitive, psychosocial history related to current functional performance    Occupational performance deficits (Please refer to evaluation for details): ADL's;IADL's;Rest and Sleep;Leisure;Social Participation    Body Structure / Function /  Physical Skills ADL;ROM;Dexterity;Edema;Balance;IADL;Body mechanics;Improper spinal/pelvic alignment;Sensation;Mobility;Strength;Muscle spasms;Coordination;Tone;Pain;UE functional use;Decreased knowledge of use of DME;Vision    Rehab Potential Fair    Clinical Decision Making Several treatment options, min-mod task modification necessary    Comorbidities Affecting Occupational Performance: May have comorbidities impacting occupational performance    Modification or Assistance to Complete Evaluation  No modification of tasks or assist necessary to complete eval    OT Frequency 2x / week    OT Duration 8 weeks    OT Treatment/Interventions Self-care/ADL training;Therapeutic exercise;Functional Mobility Training;Aquatic Therapy;Neuromuscular education;Manual Therapy;Splinting;Therapeutic activities;Coping strategies training;DME and/or AE instruction;Cognitive remediation/compensation;Electrical Stimulation;Visual/perceptual remediation/compensation;Moist Heat;Passive range of motion;Patient/family education;Ultrasound    Plan aquatic therapy, continue NMR and pain management, need recert to extend end date    OT Home Exercise Plan Initiated supine body on arm with partial rolling - girlfriend not present    Consulted and Agree with Plan of Care Patient           Patient will benefit from skilled therapeutic intervention in order to improve the following deficits and impairments:   Body Structure / Function / Physical Skills: ADL, ROM, Dexterity, Edema, Balance, IADL, Body mechanics, Improper spinal/pelvic alignment, Sensation, Mobility, Strength, Muscle spasms, Coordination, Tone, Pain, UE functional use, Decreased knowledge of use of DME, Vision       Visit Diagnosis: Spastic hemiplegia of left nondominant 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 (HCC) - Plan: Ot plan of care cert/re-cert  Other disturbances of skin sensation - Plan: Ot plan of care cert/re-cert  Unsteadiness on feet -  Plan: Ot plan of care cert/re-cert  Muscle weakness (generalized) - Plan: Ot plan of care cert/re-cert  Pain in left arm - Plan: Ot plan of care cert/re-cert    Problem List Patient Active Problem List   Diagnosis Date Noted   Neuropathic pain 04/09/2020   Pain    Spastic hemiparesis (HCC)    History of hypertension    Dyslipidemia    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

## 2020-07-16 ENCOUNTER — Other Ambulatory Visit: Payer: Self-pay

## 2020-07-16 ENCOUNTER — Ambulatory Visit: Payer: BC Managed Care – PPO

## 2020-07-16 DIAGNOSIS — R2689 Other abnormalities of gait and mobility: Secondary | ICD-10-CM | POA: Diagnosis not present

## 2020-07-16 DIAGNOSIS — M6281 Muscle weakness (generalized): Secondary | ICD-10-CM

## 2020-07-16 DIAGNOSIS — M79602 Pain in left arm: Secondary | ICD-10-CM | POA: Diagnosis not present

## 2020-07-16 DIAGNOSIS — I69354 Hemiplegia and hemiparesis following cerebral infarction affecting left non-dominant side: Secondary | ICD-10-CM | POA: Diagnosis not present

## 2020-07-16 DIAGNOSIS — R2681 Unsteadiness on feet: Secondary | ICD-10-CM

## 2020-07-16 DIAGNOSIS — R209 Unspecified disturbances of skin sensation: Secondary | ICD-10-CM | POA: Diagnosis not present

## 2020-07-16 NOTE — Therapy (Signed)
[facility] [address] [place], [place], [address] Phone: [phone]   Fax:  [phone]  Physical Therapy Treatment  Patient Details  Name: Derek [name] MRN: [id number] Date of Birth: [birth year] Referring Provider (PT): Faith Rogue, MD   Encounter Date: 07/16/2020   PT End of Session - 07/16/20 1537    Visit Number 17    Number of Visits 26    Date for PT Re-Evaluation 09/24/20   POC for 6 weeks, Cert for 90 days   Authorization Type BCBS    PT Start Time 1531    PT Stop Time 1615    PT Time Calculation (min) 44 min    Equipment Utilized During Treatment Gait belt    Activity Tolerance Patient tolerated treatment well    Behavior During Therapy [facility] for tasks assessed/performed;Flat affect           Past Medical History:  Diagnosis Date  . 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 Alcohol dependence (HCC)   . Back pain   . Marijuana dependence (HCC)   . Tobacco dependence     Past Surgical History:  Procedure Laterality Date  . BUBBLE STUDY  01/10/2020   Procedure: BUBBLE STUDY;  Surgeon: Sande Rives, MD;  Location: [facility] ENDOSCOPY;  Service: Cardiovascular;;  . TEE WITHOUT CARDIOVERSION N/A 01/10/2020   Procedure: TRANSESOPHAGEAL ECHOCARDIOGRAM (TEE);  Surgeon: Sande Rives, MD;  Location: [facility] ENDOSCOPY;  Service: Cardiovascular;  Laterality: N/A;    There were no vitals filed for this visit.   Subjective Assessment - 07/16/20 1534    Subjective Patient reports no new changes/complaints since last visit. Reports that planning to order foot up brace this week, has not got it this week.  No pain.    Pertinent History Alcohol and Tobacco Use    Limitations Standing;Walking;Sitting    How long can you stand comfortably? 3-5 minutes    Currently in Pain? No/denies    Pain Onset More than a month ago                             [facility] Adult PT  Treatment/Exercise - 07/16/20 0001      Transfers   Transfers Stand Pivot Transfers;Sit to Stand;Stand to Sit    Sit to Stand 5: Supervision    Stand to Sit 5: Supervision    Stand to Sit Details (indicate cue type and reason) Verbal cues for precautions/safety;Verbal cues for technique    Stand Pivot Transfers 5: Supervision    Stand Pivot Transfer Details (indicate cue type and reason) completed transfer from mat <> w/c with hemiwalker      Ambulation/Gait   Ambulation/Gait Yes    Ambulation/Gait Assistance 4: Min guard    Ambulation/Gait Assistance Details completed ambulation with hemiwalker and L foot up brace donned. Patient continue to demo improved toe clearance with ambulation with this donned. Patient reporting that he plans to order brace this week, and reports that he feels comfortable with brace and reports does not drag toe as much with PT verblaizing agreement.     Ambulation Distance (Feet) 300 Feet    Assistive device Hemi-walker    Gait Pattern Step-through pattern;Decreased step length - left;Decreased stance time - left;Decreased step length - right;Decreased hip/knee flexion - left;Decreased dorsiflexion - left    Ambulation Surface Level;Indoor    Stairs Yes    Stairs Assistance 4: Min assist  Stairs Assistance Details (indicate cue type and reason) completed stair training including ascending/descending stairs with R Rail and Step To Pattern. PT educating on proper sequencing for ascending/descending stairs with patient able to demo proper technique. PT providing CGA for completion at this time. PT unable to educate family member to allow for teaching them how to ascend/descend stairs with patient at home. PT educating on bringing in family member at next session to allow for further training to allow patient to utilize stairs at home.     Stair Management Technique One rail Right;Step to pattern;Forwards    Number of Stairs 12    Height of Stairs 6      Self-Care    Self-Care Other Self-Care Comments    Other Self-Care Comments  PT educating on bringing family member/friend to next session to allow to be trained on proper ascending/descending stairs with patient. PT also educating on potential membership to [facility] to allow to complete SciFit/Nustep to allow for improved motion.       Exercises   Exercises Knee/Hip      Knee/Hip Exercises: Aerobic   Other Aerobic Completed SciFit x 10 minutes, with BLE's and no UE use. PT providing min A for placement of LLE with completion, verbal cues for keeping knee in neutral alignment. improvements in ROM noted upon completion.                     PT Short Term Goals - 06/26/20 1946      PT SHORT TERM GOAL #1   Title Patient will report completing walking daily at home 2x/day (ALL STGs Due: 07/24/2020)    Baseline not completing daily    Time 3    Period Weeks    Status New    Target Date 07/24/20      PT SHORT TERM GOAL #2   Time --             PT Long Term Goals - 06/26/20 1622      PT LONG TERM GOAL #1   Title Patient will be independent and complaint with Final HEP and walking program with caregiver assistance (ALL LTGs: 08/07/20)    Baseline Patient reports completing HEP 1x/week, patient reports difficulty getting assistance to complete exercises.    Time 6    Period Weeks    Status Revised    Target Date 08/07/20      PT LONG TERM GOAL #2   Title Patient will demonstrate ability to ambulate > 500 ft w/ LRAD and Supv to demo improved community mobility    Baseline 400 ft w/ hemiwalker CGA    Time 6    Period Weeks    Status Revised      PT LONG TERM GOAL #3   Title Patient will improve TUG to </= 25 secs w/ LRAD to demo reduced risk for falls    Baseline 58.66 secs w/ hemiwalker, 31.19 secs w/ hemiwalker    Time 6    Period Weeks    Status Revised      PT LONG TERM GOAL #4   Title Patient will demo ability to ambulate at gait speed of >/= 2.0 ft/sec to demo improved  functional mobility    Baseline 1.26 ft/sec    Time 8    Period Weeks    Status On-going      PT LONG TERM GOAL #5   Title Patient will demo ability to complete all bed mobility and transfers, Mod  I, for improved independence    Baseline CGA for bed mobility and transfers    Time 8    Period Weeks    Status On-going                 Plan - 07/16/20 1619    Clinical Impression Statement Today's skilled PT session included continued gait training with L foot up brace donned, as well as initiating stair training with patient. Patient using R rail and step to pattern able to ascend/descend appropriately with CGA. PT educating to bring family member/friend into next session to allow for family training on stairs for completion at home. Will continue to progress toward goals.    Personal Factors and Comorbidities Comorbidity 2    Comorbidities Chronic Back Pain,HTN, Prior History of Alcohol/Tobacco Use    Examination-Activity Limitations Bed Mobility;Stairs;Stand;Toileting;Transfers;Dressing    Examination-Participation Restrictions Community Activity;Driving;Yard Work;Other   Occupation   Stability/Clinical Decision Making Evolving/Moderate complexity    Rehab Potential Good    PT Frequency 2x / week    PT Duration 6 weeks    PT Treatment/Interventions ADLs/Self Care Home Management;Electrical Stimulation;Aquatic Therapy;Moist Heat;DME Instruction;Gait training;Stair training;Functional mobility training;Therapeutic activities;Therapeutic exercise;Balance training;Neuromuscular re-education;Patient/family education;Orthotic Fit/Training;Wheelchair mobility training;Manual techniques;Passive range of motion    PT Next Visit Plan did pt get foot up brace? if not continue with clinic foot up brace with gait. Complete stair training with patient and family memebr (if present). stretching of LLE, pt wants to try NuStep again, standing weight bearing.    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 Neuropathic pain 04/09/2020  . Pain   . Spastic hemiparesis (HCC)   . History of hypertension   . Dyslipidemia   . 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 Leukocytosis 01/12/2020  . Cerebrovascular accident (CVA) due to thrombosis of right middle cerebral artery (HCC)   . Chronic pain syndrome   . Hypokalemia   . Dysphagia, post-stroke   . 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 Tobacco dependence   . Marijuana dependence (HCC)   . Alcohol dependence (HCC)     Tempie Donning, PT, DPT 07/16/2020, 4:21 PM  [facility] [address] [place], [place], [address] Phone: [phone]   Fax:  [phone]  Name: Derek [name] MRN: [id number] Date of Birth: [birth year]

## 2020-07-21 ENCOUNTER — Encounter: Payer: Self-pay | Admitting: Occupational Therapy

## 2020-07-21 ENCOUNTER — Ambulatory Visit: Payer: BC Managed Care – PPO | Admitting: Occupational Therapy

## 2020-07-21 DIAGNOSIS — M6281 Muscle weakness (generalized): Secondary | ICD-10-CM | POA: Diagnosis not present

## 2020-07-21 DIAGNOSIS — I69354 Hemiplegia and hemiparesis following cerebral infarction affecting left non-dominant side: Secondary | ICD-10-CM | POA: Diagnosis not present

## 2020-07-21 DIAGNOSIS — R2681 Unsteadiness on feet: Secondary | ICD-10-CM | POA: Diagnosis not present

## 2020-07-21 DIAGNOSIS — R209 Unspecified disturbances of skin sensation: Secondary | ICD-10-CM | POA: Diagnosis not present

## 2020-07-21 DIAGNOSIS — R2689 Other abnormalities of gait and mobility: Secondary | ICD-10-CM | POA: Diagnosis not present

## 2020-07-21 DIAGNOSIS — M79602 Pain in left arm: Secondary | ICD-10-CM | POA: Diagnosis not present

## 2020-07-21 NOTE — Therapy (Signed)
[facility] [address] [place], [place], [address] Phone: [phone]   Fax:  [phone]  Occupational Therapy Treatment  Patient Details  Name: Derek [name] MRN: [id number] Date of Birth: [birth year] Referring Provider (OT): Dr. Jacklynn Lewis   Encounter Date: 07/21/2020   OT End of Session - 07/21/20 1919    Visit Number 16    Number of Visits 17    Authorization Type BC/BS - 60 combined visits for all disciplines    Authorization - Visit Number 16    Authorization - Number of Visits 30    OT Start Time 1550    OT Stop Time 1635    OT Time Calculation (min) 45 min    Activity Tolerance Patient tolerated treatment well    Behavior During Therapy [facility] for tasks assessed/performed;Flat affect           Past Medical History:  Diagnosis Date  . 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 Alcohol dependence (HCC)   . Back pain   . Marijuana dependence (HCC)   . Tobacco dependence     Past Surgical History:  Procedure Laterality Date  . BUBBLE STUDY  01/10/2020   Procedure: BUBBLE STUDY;  Surgeon: Sande Rives, MD;  Location: [facility] ENDOSCOPY;  Service: Cardiovascular;;  . TEE WITHOUT CARDIOVERSION N/A 01/10/2020   Procedure: TRANSESOPHAGEAL ECHOCARDIOGRAM (TEE);  Surgeon: Sande Rives, MD;  Location: [facility] ENDOSCOPY;  Service: Cardiovascular;  Laterality: N/A;    There were no vitals filed for this visit.   Subjective Assessment - 07/21/20 1916    Subjective  Patient reports no shoulder pain this session    Pertinent History CVA 01/07/20 w/ residual Lt hemiparesis. PMH: HTN, ETOH abuse, smoker    Limitations fall risk    Pain Score 0-No pain            Patient seen for aquatic therapy.  Patient entered the pool via stairs for the first time with min assistance,  Patient attempting to step down with LLE into 3.5 then 4 feet of water.  Patient needed facilitation to maintain postural  control during stair climb.  Worked on walking with decreased arm support with emphasis on step length and weight shifting forward.  In supported supine, patient tolerated gentle neck range of motion and scapula mobilization.   Patient with mild shoulder discomfort during supine body on arm exercises.                       OT Short Term Goals - 07/21/20 1921      OT SHORT TERM GOAL #1   Title Independent with HEP for LUE    Time 4    Period Weeks    Status On-going      OT SHORT TERM GOAL #2   Title Pt to be independent with splint wear and care prn    Time 4    Period Weeks    Status Achieved      OT SHORT TERM GOAL #3   Title Pt to verbalize understanding with pain management strategies and proper positioning of LUE    Time 4    Period Weeks    Status On-going      OT SHORT TERM GOAL #4   Title Pt to bathe self with min assist seated    Baseline mod assist seated    Time 4    Period Weeks    Status On-going  OT SHORT TERM GOAL #5   Title Pt to perform UE dressing with min assist and LE dressing with mod assist    Baseline mod assist/max assist    Time 4    Period Weeks    Status On-going      OT SHORT TERM GOAL #6   Title Pt to tolerate passive shoulder flexion to 90* supine w/ pain 5/10 or under    Status Achieved             OT Long Term Goals - 07/14/20 2035      OT LONG TERM GOAL #1   Title Pt to tolerate 75% PROM LUE with pain 3/10 or under    Time 8    Period Weeks    Status On-going      OT LONG TERM GOAL #2   Title Pt to use LUE as stabalizer for bilateral tasks    Time 8    Period Weeks    Status On-going      OT LONG TERM GOAL #3   Title Pt to verbalize understanding with potential A/E needs and task modifications to increase ease with ADLS and simple IADLS    Time 8    Period Weeks    Status On-going      OT LONG TERM GOAL #4   Title Pt to be mod I level with bathing and UE dressing and min assist for LE dressing     Time 8    Period Weeks    Status On-going      OT LONG TERM GOAL #5   Title Pt to perform toileting at mod I level    Time 8    Period Weeks    Status Achieved      OT LONG TERM GOAL #6   Title Pt to perform simple snack prep/sandwich prep from w/c level prn or w/ use of walker and walker tray    Time 8    Period Weeks    Status On-going                 Plan - 07/21/20 1919    Clinical Impression Statement Patient with limited improvement in shoulder range of motion    OT Occupational Profile and History Detailed Assessment- Review of Records and additional review of physical, cognitive, psychosocial history related to current functional performance    Occupational performance deficits (Please refer to evaluation for details): ADL's;IADL's;Rest and Sleep;Leisure;Social Participation    Body Structure / Function / Physical Skills ADL;ROM;Dexterity;Edema;Balance;IADL;Body mechanics;Improper spinal/pelvic alignment;Sensation;Mobility;Strength;Muscle spasms;Coordination;Tone;Pain;UE functional use;Decreased knowledge of use of DME;Vision    Rehab Potential Fair    Clinical Decision Making Several treatment options, min-mod task modification necessary    Comorbidities Affecting Occupational Performance: May have comorbidities impacting occupational performance    Modification or Assistance to Complete Evaluation  No modification of tasks or assist necessary to complete eval    OT Frequency 2x / week    OT Duration 8 weeks    OT Treatment/Interventions Self-care/ADL training;Therapeutic exercise;Functional Mobility Training;Aquatic Therapy;Neuromuscular education;Manual Therapy;Splinting;Therapeutic activities;Coping strategies training;DME and/or AE instruction;Cognitive remediation/compensation;Electrical Stimulation;Visual/perceptual remediation/compensation;Moist Heat;Passive range of motion;Patient/family education;Ultrasound    Plan aquatic therapy, continue NMR and pain  management    OT Home Exercise Plan Initiated supine body on arm with partial rolling - girlfriend not present    Consulted and Agree with Plan of Care Patient           Patient will benefit from skilled therapeutic intervention in  order to improve the following deficits and impairments:   Body Structure / Function / Physical Skills: ADL, ROM, Dexterity, Edema, Balance, IADL, Body mechanics, Improper spinal/pelvic alignment, Sensation, Mobility, Strength, Muscle spasms, Coordination, Tone, Pain, UE functional use, Decreased knowledge of use of DME, Vision       Visit Diagnosis: Spastic hemiplegia of left nondominant 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 (HCC)  Unsteadiness on feet  Muscle weakness (generalized)  Other disturbances of skin sensation  Pain in left arm  Hemiplegia and hemiparesis following cerebral infarction affecting left non-dominant side [facility])    Problem List Patient Active Problem List   Diagnosis Date Noted  . Neuropathic pain 04/09/2020  . Pain   . Spastic hemiparesis (HCC)   . History of hypertension   . Dyslipidemia   . 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 Leukocytosis 01/12/2020  . Cerebrovascular accident (CVA) due to thrombosis of right middle cerebral artery (HCC)   . Chronic pain syndrome   . Hypokalemia   . Dysphagia, post-stroke   . 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 Tobacco dependence   . Marijuana dependence (HCC)   . Alcohol dependence [facility])     Collier Salina, OTR/L 07/21/2020, 7:23 PM  [facility] [address] [place], [place], [address] Phone: [phone]   Fax:  [phone]  Name: Derek [name] MRN: [id number] Date of Birth: 09/08/1976

## 2020-07-22 ENCOUNTER — Other Ambulatory Visit: Payer: Self-pay

## 2020-07-22 ENCOUNTER — Ambulatory Visit: Payer: BC Managed Care – PPO

## 2020-07-22 DIAGNOSIS — M6281 Muscle weakness (generalized): Secondary | ICD-10-CM

## 2020-07-22 DIAGNOSIS — R209 Unspecified disturbances of skin sensation: Secondary | ICD-10-CM | POA: Diagnosis not present

## 2020-07-22 DIAGNOSIS — M79602 Pain in left arm: Secondary | ICD-10-CM | POA: Diagnosis not present

## 2020-07-22 DIAGNOSIS — I69354 Hemiplegia and hemiparesis following cerebral infarction affecting left non-dominant side: Secondary | ICD-10-CM | POA: Diagnosis not present

## 2020-07-22 DIAGNOSIS — R2689 Other abnormalities of gait and mobility: Secondary | ICD-10-CM | POA: Diagnosis not present

## 2020-07-22 DIAGNOSIS — R2681 Unsteadiness on feet: Secondary | ICD-10-CM | POA: Diagnosis not present

## 2020-07-22 NOTE — Therapy (Signed)
[name] [address] [place], [place], [address] Phone: [phone]   Fax:  [phone]  Physical Therapy Treatment  Patient Details  Name: Derek [name] MRN: [id number] Date of Birth: 1975-11-18 Referring Provider (PT): Faith Rogue, MD   Encounter Date: 07/22/2020   PT End of Session - 07/22/20 1620    Visit Number 18    Number of Visits 26    Date for PT Re-Evaluation 09/24/20   POC for 6 weeks, Cert for 90 days   Authorization Type BCBS    PT Start Time 1616    PT Stop Time 1700    PT Time Calculation (min) 44 min    Equipment Utilized During Treatment Gait belt    Activity Tolerance Patient tolerated treatment well    Behavior During Therapy Derek [name] for tasks assessed/performed;Flat affect           Past Medical History:  Diagnosis Date   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/07/2020   Alcohol dependence (HCC)    Back pain    Marijuana dependence (HCC)    Tobacco dependence     Past Surgical History:  Procedure Laterality Date   BUBBLE STUDY  01/10/2020   Procedure: BUBBLE STUDY;  Surgeon: Sande Rives, MD;  Location: [name] ENDOSCOPY;  Service: Cardiovascular;;   TEE WITHOUT CARDIOVERSION N/A 01/10/2020   Procedure: TRANSESOPHAGEAL ECHOCARDIOGRAM (TEE);  Surgeon: Sande Rives, MD;  Location: [facility] ENDOSCOPY;  Service: Cardiovascular;  Laterality: N/A;    There were no vitals filed for this visit.   Subjective Assessment - 07/22/20 1619    Subjective Patient reports that he got foot up brace, and wants PT to apply it to shoe. No new changes/complaints since last visit.    Pertinent History Alcohol and Tobacco Use    Limitations Standing;Walking;Sitting    How long can you stand comfortably? 3-5 minutes    Currently in Pain? No/denies    Pain Onset More than a month ago                             [facility] Adult PT Treatment/Exercise - 07/22/20 0001       Transfers   Transfers Stand Pivot Transfers;Sit to Stand;Stand to Sit    Sit to Stand 5: Supervision    Stand to Sit 5: Supervision    Stand to Sit Details (indicate cue type and reason) Verbal cues for precautions/safety;Verbal cues for technique    Stand Pivot Transfers 5: Supervision      Ambulation/Gait   Ambulation/Gait Yes    Ambulation/Gait Assistance 4: Min guard    Ambulation/Gait Assistance Details completed gait training with patient's personal foot up brace donned with hemi-walker. PT providing verbal cues for step length. still demo 1-2 instances of toe drag, with PT providing verbal cues for improved hip/knee flexion    Ambulation Distance (Feet) 245 Feet    Assistive device Hemi-walker    Gait Pattern Step-through pattern;Decreased step length - left;Decreased stance time - left;Decreased step length - right;Decreased hip/knee flexion - left;Decreased dorsiflexion - left    Ambulation Surface Level;Indoor    Stairs Yes    Stairs Assistance 4: Min guard    Stairs Assistance Details (indicate cue type and reason) continued stair training iwth ascend/descend with step to pattern and single rail on R side. PT providing verbal cues to ensure proper foot palcement of stair    Stair Management Technique One  rail Right;Step to pattern;Forwards    Number of Stairs 12    Height of Stairs 6    Gait Comments PT donned patient's personal foot up brace as patient had purchased and brought into session. PT education on how to donn/doff brace if needed.       Neuro Re-ed    Neuro Re-ed Details  Standing without UE support with mirror placed in front for visual feedback, completed staggered stance and completed diagonal weight shift to promote improved weight shift to LLE, completed x 10 reps then switched foot positoin to completed other staggered stance, increased difficulty with LLE anterior. Completed static standing with PT providing manual faciliaton for posture and completed lateral  weight shifting x 3 minutes. Completed stepping forward with RLE to promote improved weight shift to LLE, completed x 15 reps. CGA throughout      Exercises   Exercises Knee/Hip      Knee/Hip Exercises: Aerobic   Other Aerobic Completed SciFit x 6 minutes, with BLE's and no UE use. PT providing min A for placement of LLE with completion, verbal cues for keeping knee in neutral alignment. patient reports improvements in stiffness with completion                    PT Short Term Goals - 06/26/20 1946      PT SHORT TERM GOAL #1   Title Patient will report completing walking daily at home 2x/day (ALL STGs Due: 07/24/2020)    Baseline not completing daily    Time 3    Period Weeks    Status New    Target Date 07/24/20      PT SHORT TERM GOAL #2   Time --             PT Long Term Goals - 06/26/20 1622      PT LONG TERM GOAL #1   Title Patient will be independent and complaint with Final HEP and walking program with caregiver assistance (ALL LTGs: 08/07/20)    Baseline Patient reports completing HEP 1x/week, patient reports difficulty getting assistance to complete exercises.    Time 6    Period Weeks    Status Revised    Target Date 08/07/20      PT LONG TERM GOAL #2   Title Patient will demonstrate ability to ambulate > 500 ft w/ LRAD and Supv to demo improved community mobility    Baseline 400 ft w/ hemiwalker CGA    Time 6    Period Weeks    Status Revised      PT LONG TERM GOAL #3   Title Patient will improve TUG to </= 25 secs w/ LRAD to demo reduced risk for falls    Baseline 58.66 secs w/ hemiwalker, 31.19 secs w/ hemiwalker    Time 6    Period Weeks    Status Revised      PT LONG TERM GOAL #4   Title Patient will demo ability to ambulate at gait speed of >/= 2.0 ft/sec to demo improved functional mobility    Baseline 1.26 ft/sec    Time 8    Period Weeks    Status On-going      PT LONG TERM GOAL #5   Title Patient will demo ability to complete all  bed mobility and transfers, Mod I, for improved independence    Baseline CGA for bed mobility and transfers    Time 8    Period Weeks  Status On-going                 Plan - 07/22/20 1751    Clinical Impression Statement Today's skilled session included continued gait training and stair training with patient's personal foot up brace. Patient purchased foot up brace and PT donned to patient's shoe. Contineud activites promoting improved weight shift and posture today. Will continue to progress toward all goals.    Personal Factors and Comorbidities Comorbidity 2    Comorbidities Chronic Back Pain,HTN, Prior History of Alcohol/Tobacco Use    Examination-Activity Limitations Bed Mobility;Stairs;Stand;Toileting;Transfers;Dressing    Examination-Participation Restrictions Community Activity;Driving;Yard Work;Other   Occupation   Stability/Clinical Decision Making Evolving/Moderate complexity    Rehab Potential Good    PT Frequency 2x / week    PT Duration 6 weeks    PT Treatment/Interventions ADLs/Self Care Home Management;Electrical Stimulation;Aquatic Therapy;Moist Heat;DME Instruction;Gait training;Stair training;Functional mobility training;Therapeutic activities;Therapeutic exercise;Balance training;Neuromuscular re-education;Patient/family education;Orthotic Fit/Training;Wheelchair mobility training;Manual techniques;Passive range of motion    PT Next Visit Plan Complete stair training with patient and family memebr (if present). stretching of LLE, pt wants to try NuStep again, standing weight bearing.    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 (HCC)  Unsteadiness on feet  Muscle weakness (generalized)  Other abnormalities of gait and mobility     Problem List Patient Active Problem List   Diagnosis Date Noted   Neuropathic pain 04/09/2020   Pain    Spastic hemiparesis (HCC)    History of hypertension    Dyslipidemia    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 [place], [place], [address] Phone: [phone]   Fax:  [phone]  Name: NIVAN MELENDREZ MRN: [id number] Date of Birth: Jan 21, 1976

## 2020-07-24 ENCOUNTER — Other Ambulatory Visit: Payer: Self-pay

## 2020-07-24 ENCOUNTER — Ambulatory Visit: Payer: BC Managed Care – PPO

## 2020-07-24 DIAGNOSIS — R2689 Other abnormalities of gait and mobility: Secondary | ICD-10-CM

## 2020-07-24 DIAGNOSIS — M6281 Muscle weakness (generalized): Secondary | ICD-10-CM

## 2020-07-24 DIAGNOSIS — R2681 Unsteadiness on feet: Secondary | ICD-10-CM | POA: Diagnosis not present

## 2020-07-24 DIAGNOSIS — I69354 Hemiplegia and hemiparesis following cerebral infarction affecting left non-dominant side: Secondary | ICD-10-CM | POA: Diagnosis not present

## 2020-07-24 DIAGNOSIS — R209 Unspecified disturbances of skin sensation: Secondary | ICD-10-CM | POA: Diagnosis not present

## 2020-07-24 DIAGNOSIS — M79602 Pain in left arm: Secondary | ICD-10-CM | POA: Diagnosis not present

## 2020-07-24 NOTE — Therapy (Signed)
[facility] [address] [place], [place], [address] Phone: [phone]   Fax:  [phone]  Physical Therapy Treatment  Patient Details  Name: Derek [name] MRN: [id number] Date of Birth: November 17, 1975 Referring Provider (PT): Faith Rogue, MD   Encounter Date: 07/24/2020   PT End of Session - 07/24/20 1616    Visit Number 19    Number of Visits 26    Date for PT Re-Evaluation 09/24/20   POC for 6 weeks, Cert for 90 days   Authorization Type BCBS    PT Start Time 1613    PT Stop Time 1658    PT Time Calculation (min) 45 min    Equipment Utilized During Treatment Gait belt    Activity Tolerance Patient tolerated treatment well    Behavior During Therapy [facility] for tasks assessed/performed;Flat affect           Past Medical History:  Diagnosis Date  . 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 Alcohol dependence (HCC)   . Back pain   . Marijuana dependence (HCC)   . Tobacco dependence     Past Surgical History:  Procedure Laterality Date  . BUBBLE STUDY  01/10/2020   Procedure: BUBBLE STUDY;  Surgeon: Sande Rives, MD;  Location: [facility] ENDOSCOPY;  Service: Cardiovascular;;  . TEE WITHOUT CARDIOVERSION N/A 01/10/2020   Procedure: TRANSESOPHAGEAL ECHOCARDIOGRAM (TEE);  Surgeon: Sande Rives, MD;  Location: [facility] ENDOSCOPY;  Service: Cardiovascular;  Laterality: N/A;    There were no vitals filed for this visit.   Subjective Assessment - 07/24/20 1617    Subjective Patient reports no new changes/complaints since last visit. Patient reports hasnt done much today as was feeling lazy.    Pertinent History Alcohol and Tobacco Use    Limitations Standing;Walking;Sitting    How long can you stand comfortably? 3-5 minutes    Currently in Pain? Yes    Pain Score 3     Pain Location Shoulder    Pain Orientation Left    Pain Descriptors / Indicators Aching    Pain Type Chronic pain    Pain  Onset More than a month ago                             [facility] Adult PT Treatment/Exercise - 07/24/20 0001      Transfers   Transfers Sit to Stand;Stand to Sit    Sit to Stand 5: Supervision    Stand to Sit 5: Supervision    Stand Pivot Transfers 5: Supervision    Comments all transfers completed with hemiwalker, and from w/c <> mat.       Ambulation/Gait   Ambulation/Gait Yes    Ambulation/Gait Assistance 4: Min guard    Ambulation/Gait Assistance Details throughout therapy session with activities. completed gait training with hemiwalker x 115 ft with CGA from PT as needed    Ambulation Distance (Feet) 115 Feet    Assistive device Hemi-walker    Gait Pattern Step-through pattern;Decreased step length - left;Decreased stance time - left;Decreased step length - right;Decreased hip/knee flexion - left;Decreased dorsiflexion - left    Ambulation Surface Level;Indoor      Neuro Re-ed    Neuro Re-ed Details  Standing in // bars completed the following exercises: alternating marching working to promote improved hip/knee flexion of LLE, as well as improved weigth acceptance with completion of RLE marching. Completed mini squats with single UE  support x 15 reps. PT providing vebral cues for technique. In supine position, PT completing PNF D1 pattern to promote reduction in tone and improved motion, completed x 5 reps progressing to AROM by patient without resistance.       Exercises   Exercises Other Exercises    Other Exercises  Completed alternating trunk rotations x 15 reps to bilateral direction to promote improved stretching of BLE/ trunk, PT providing faciliation at first. PT completed manual stretch to hamstrings 3 x 1 minute each working into patient tolerance. Completed bridges, 2 x 10 reps, PT providing verbal cues for improved form with completion, including 2-3 seconds hold.       Knee/Hip Exercises: Aerobic   Nustep Completed NuStep x 6 mins on Level 3 with BLE's,  No use of UE completed x minutes for improved AROM and BLE strengthening.                     PT Short Term Goals - 07/24/20 1620      PT SHORT TERM GOAL #1   Title Patient will report completing walking daily at home 2x/day (ALL STGs Due: 07/24/2020)    Baseline Patients report walking 3-4x/daily in the home w/ AD    Time 3    Period Weeks    Status Achieved    Target Date 07/24/20             PT Long Term Goals - 06/26/20 1622      PT LONG TERM GOAL #1   Title Patient will be independent and complaint with Final HEP and walking program with caregiver assistance (ALL LTGs: 08/07/20)    Baseline Patient reports completing HEP 1x/week, patient reports difficulty getting assistance to complete exercises.    Time 6    Period Weeks    Status Revised    Target Date 08/07/20      PT LONG TERM GOAL #2   Title Patient will demonstrate ability to ambulate > 500 ft w/ LRAD and Supv to demo improved community mobility    Baseline 400 ft w/ hemiwalker CGA    Time 6    Period Weeks    Status Revised      PT LONG TERM GOAL #3   Title Patient will improve TUG to </= 25 secs w/ LRAD to demo reduced risk for falls    Baseline 58.66 secs w/ hemiwalker, 31.19 secs w/ hemiwalker    Time 6    Period Weeks    Status Revised      PT LONG TERM GOAL #4   Title Patient will demo ability to ambulate at gait speed of >/= 2.0 ft/sec to demo improved functional mobility    Baseline 1.26 ft/sec    Time 8    Period Weeks    Status On-going      PT LONG TERM GOAL #5   Title Patient will demo ability to complete all bed mobility and transfers, Mod I, for improved independence    Baseline CGA for bed mobility and transfers    Time 8    Period Weeks    Status On-going                 Plan - 07/24/20 1655    Clinical Impression Statement Today's skilled PT session included assessment of patient's progress toward STGs, with patient able to meet these today during session.  Continued activities in supine focused on improved management of tone, and BLE strengthening. Will  continue to progress toward LTG's.    Personal Factors and Comorbidities Comorbidity 2    Comorbidities Chronic Back Pain,HTN, Prior History of Alcohol/Tobacco Use    Examination-Activity Limitations Bed Mobility;Stairs;Stand;Toileting;Transfers;Dressing    Examination-Participation Restrictions Community Activity;Driving;Yard Work;Other   Occupation   Stability/Clinical Decision Making Evolving/Moderate complexity    Rehab Potential Good    PT Frequency 2x / week    PT Duration 6 weeks    PT Treatment/Interventions ADLs/Self Care Home Management;Electrical Stimulation;Aquatic Therapy;Moist Heat;DME Instruction;Gait training;Stair training;Functional mobility training;Therapeutic activities;Therapeutic exercise;Balance training;Neuromuscular re-education;Patient/family education;Orthotic Fit/Training;Wheelchair mobility training;Manual techniques;Passive range of motion    PT Next Visit Plan Complete stair training with patient and family memebr (if present). stretching of LLE, pt wants to try NuStep again, standing weight bearing.    Consulted and Agree with Plan of Care Patient           Patient will benefit from skilled therapeutic intervention in order to improve the following deficits and impairments:  Abnormal gait, Decreased balance, Decreased endurance, Decreased mobility, Difficulty walking, Impaired tone, Impaired sensation, Pain, Impaired UE functional use, Impaired flexibility, Decreased strength, Decreased knowledge of use of DME, Decreased activity tolerance, Decreased coordination, Decreased range of motion, Increased muscle spasms  Visit Diagnosis: Spastic hemiplegia of left nondominant 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 (HCC)  Unsteadiness on feet  Muscle weakness (generalized)  Other abnormalities of gait and mobility     Problem List Patient Active Problem List    Diagnosis Date Noted  . Neuropathic pain 04/09/2020  . Pain   . Spastic hemiparesis (HCC)   . History of hypertension   . Dyslipidemia   . 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 Leukocytosis 01/12/2020  . Cerebrovascular accident (CVA) due to thrombosis of right middle cerebral artery (HCC)   . Chronic pain syndrome   . Hypokalemia   . Dysphagia, post-stroke   . 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 Tobacco dependence   . Marijuana dependence (HCC)   . Alcohol dependence (HCC)     Tempie Donning, PT, DPT 07/24/2020, 6:33 PM  [facility] [address] [place], [place], [address] Phone: [phone]   Fax:  [phone]  Name: NATHANIAL ARRIGHI MRN: [id number] Date of Birth: [birth year]

## 2020-07-25 DIAGNOSIS — E7800 Pure hypercholesterolemia, unspecified: Secondary | ICD-10-CM | POA: Diagnosis not present

## 2020-07-25 DIAGNOSIS — I10 Essential (primary) hypertension: Secondary | ICD-10-CM | POA: Diagnosis not present

## 2020-07-25 DIAGNOSIS — I6930 Unspecified sequelae of cerebral infarction: Secondary | ICD-10-CM | POA: Diagnosis not present

## 2020-07-28 ENCOUNTER — Encounter: Payer: Self-pay | Admitting: Occupational Therapy

## 2020-07-28 ENCOUNTER — Ambulatory Visit: Payer: BC Managed Care – PPO | Admitting: Occupational Therapy

## 2020-07-28 DIAGNOSIS — M6281 Muscle weakness (generalized): Secondary | ICD-10-CM | POA: Diagnosis not present

## 2020-07-28 DIAGNOSIS — M79602 Pain in left arm: Secondary | ICD-10-CM | POA: Diagnosis not present

## 2020-07-28 DIAGNOSIS — I69354 Hemiplegia and hemiparesis following cerebral infarction affecting left non-dominant side: Secondary | ICD-10-CM | POA: Diagnosis not present

## 2020-07-28 DIAGNOSIS — R2681 Unsteadiness on feet: Secondary | ICD-10-CM | POA: Diagnosis not present

## 2020-07-28 DIAGNOSIS — R209 Unspecified disturbances of skin sensation: Secondary | ICD-10-CM | POA: Diagnosis not present

## 2020-07-28 DIAGNOSIS — R2689 Other abnormalities of gait and mobility: Secondary | ICD-10-CM | POA: Diagnosis not present

## 2020-07-28 NOTE — Therapy (Signed)
[facility] [address] [place], [place], [address] Phone: [phone]   Fax:  [phone]  Occupational Therapy Treatment  Patient Details  Name: Derek [name] MRN: [id number] Date of Birth: 04/03/1976 Referring Provider (OT): Dr. Augustina Mood   Encounter Date: 07/28/2020   OT End of Session - 07/28/20 1915    Visit Number 17    Number of Visits 26    Date for OT Re-Evaluation 09/26/20    Authorization Type BC/BS - 60 combined visits for all disciplines    Authorization - Visit Number 25    Authorization - Number of Visits 30    OT Start Time 1550    OT Stop Time 1635    OT Time Calculation (min) 45 min    Activity Tolerance Patient tolerated treatment well    Behavior During Therapy [facility] for tasks assessed/performed;Flat affect           Past Medical History:  Diagnosis Date   Acute ischemic right MCA stroke ([place]) 01/07/2020   Alcohol dependence ([place])    Back pain    Marijuana dependence ([place])    Tobacco dependence     Past Surgical History:  Procedure Laterality Date   BUBBLE STUDY  01/10/2020   Procedure: BUBBLE STUDY;  Surgeon: Geralynn Rile, MD;  Location: [facility];  Service: Cardiovascular;;   TEE WITHOUT CARDIOVERSION N/A 01/10/2020   Procedure: TRANSESOPHAGEAL ECHOCARDIOGRAM (TEE);  Surgeon: Geralynn Rile, MD;  Location: [facility];  Service: Cardiovascular;  Laterality: N/A;    There were no vitals filed for this visit.   Subjective Assessment - 07/28/20 1915    Subjective  Patient indicate he tookk his medication and has no shoulder pain at start of session    Patient is accompanied by: Family member    Pertinent History CVA 01/07/20 w/ residual Lt hemiparesis. PMH: HTN, ETOH abuse, smoker    Limitations fall risk    Currently in Pain? No/denies    Pain Score 0-No pain             Patient seen for aquatic therapy session this afternoon.  Worked in 31/2 ft  water.  Patient entered and exited pool from stairs using LLE to step down first and mod cueing, and RUE on hand rail - and supervision assist.  Worked on midline orientation for sit to stand from underwater bench - with emphasis on LLE alignment and weight shift toward left side.  Worked to realign trunk and left hip with stepping strategies.  Soft tissue mobilization to address left shoulder tension, restriction and pain.  Patient in sitting tolerated soft tissue mobilization toward ventral chest expansion, and external rotation of humerus without pain.  Will recertify to allow add'l visits to addresss shoulder pain, range of motion and functional mobility.                       OT Short Term Goals - 07/28/20 1919      OT SHORT TERM GOAL #1   Title Passive stretching to LUE    Time 4    Period Weeks    Status On-going      OT SHORT TERM GOAL #2   Title Patient wil tolerate facilitation techniques to align body during transitional movement    Time 4    Period Weeks    Status New      OT SHORT TERM GOAL #3   Title Pt to verbalize understanding with  pain management strategies and proper positioning of LUE    Time 4    Period Weeks    Status On-going      OT SHORT TERM GOAL #4   Baseline Patient to actively particiapte in bathing and/or dressing with max assist    Time 4    Period Weeks    Status New      OT SHORT TERM GOAL #5   Title Pt to perform UE dressing with min assist and LE dressing with mod assist    Baseline mod assist/max assist    Time 4    Period Weeks    Status Not Met      OT SHORT TERM GOAL #6   Title Pt to tolerate passive shoulder flexion to 90* supine w/ pain 5/10 or under    Status Achieved             OT Long Term Goals - 07/28/20 1922      OT LONG TERM GOAL #1   Title Pt to tolerate 75% PROM LUE with pain 3/10 or under    Time 8    Period Weeks    Status On-going      OT LONG TERM GOAL #2   Title Pt to use LUE as stabalizer  for bilateral tasks    Time 8    Period Weeks    Status Not Met      OT LONG TERM GOAL #3   Title Pt to verbalize understanding with potential A/E needs and task modifications to increase ease with ADLS and simple IADLS    Time 8    Period Weeks    Status On-going      OT LONG TERM GOAL #4   Title Pt to be mod I level with bathing and UE dressing and min assist for LE dressing    Time 8    Period Weeks    Status Not Met      OT LONG TERM GOAL #5   Title Pt to perform toileting at mod I level    Time 8    Period Weeks    Status Achieved      OT LONG TERM GOAL #6   Title Pt to perform simple snack prep/sandwich prep from w/c level prn or w/ use of walker and walker tray    Time 8    Period Weeks    Status On-going                 Plan - 07/28/20 1918    Clinical Impression Statement Patient with improved tolerance of trunk motion which hopefully wil improve LUE pain and trange of motion    OT Occupational Profile and History Detailed Assessment- Review of Records and additional review of physical, cognitive, psychosocial history related to current functional performance    Occupational performance deficits (Please refer to evaluation for details): ADL's;IADL's;Rest and Sleep;Leisure;Social Participation    Body Structure / Function / Physical Skills ADL;ROM;Dexterity;Edema;Balance;IADL;Body mechanics;Improper spinal/pelvic alignment;Sensation;Mobility;Strength;Muscle spasms;Coordination;Tone;Pain;UE functional use;Decreased knowledge of use of DME;Vision    Rehab Potential Fair    Clinical Decision Making Several treatment options, min-mod task modification necessary    Comorbidities Affecting Occupational Performance: May have comorbidities impacting occupational performance    OT Frequency 2x / week    OT Duration 6 weeks    OT Treatment/Interventions Self-care/ADL training;Therapeutic exercise;Functional Mobility Training;Aquatic Therapy;Neuromuscular education;Manual  Therapy;Splinting;Therapeutic activities;Coping strategies training;DME and/or AE instruction;Cognitive remediation/compensation;Electrical Stimulation;Visual/perceptual remediation/compensation;Moist Heat;Passive range of motion;Patient/family education;Ultrasound  Plan aquatic therapy, continue NMR and pain management    OT Home Exercise Plan Initiated supine body on arm with partial rolling - girlfriend not present    Consulted and Agree with Plan of Care Patient           Patient will benefit from skilled therapeutic intervention in order to improve the following deficits and impairments:   Body Structure / Function / Physical Skills: ADL, ROM, Dexterity, Edema, Balance, IADL, Body mechanics, Improper spinal/pelvic alignment, Sensation, Mobility, Strength, Muscle spasms, Coordination, Tone, Pain, UE functional use, Decreased knowledge of use of DME, Vision       Visit Diagnosis: Spastic hemiplegia of left nondominant 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 ([facility]) - Plan: Ot plan of care cert/re-cert  Unsteadiness on feet - Plan: Ot plan of care cert/re-cert  Muscle weakness (generalized) - Plan: Ot plan of care cert/re-cert  Other disturbances of skin sensation - Plan: Ot plan of care cert/re-cert  Pain in left arm - Plan: Ot plan of care cert/re-cert    Problem List Patient Active Problem List   Diagnosis Date Noted   Neuropathic pain 04/09/2020   Pain    Spastic hemiparesis ([facility])    History of hypertension    Dyslipidemia    Right middle cerebral artery stroke ([place]) 01/15/2020   Leukocytosis 01/12/2020   Cerebrovascular accident (CVA) due to thrombosis of right middle cerebral artery ([facility])    Chronic pain syndrome    Hypokalemia    Dysphagia, post-stroke    Acute ischemic right MCA stroke ([place]) 01/07/2020   Tobacco dependence    Marijuana dependence ([place])    Alcohol dependence ([place])     Mariah Milling, OTR/L 07/28/2020, 7:26 PM  [facility] [address] [place], [place], [address] Phone: [phone]   Fax:  [phone]  Name: Derek [name] MRN: [id number] Date of Birth: 06-11-1976

## 2020-07-29 ENCOUNTER — Other Ambulatory Visit: Payer: Self-pay

## 2020-07-29 ENCOUNTER — Ambulatory Visit: Payer: BC Managed Care – PPO

## 2020-07-29 DIAGNOSIS — I69354 Hemiplegia and hemiparesis following cerebral infarction affecting left non-dominant side: Secondary | ICD-10-CM

## 2020-07-29 DIAGNOSIS — M79602 Pain in left arm: Secondary | ICD-10-CM | POA: Diagnosis not present

## 2020-07-29 DIAGNOSIS — R209 Unspecified disturbances of skin sensation: Secondary | ICD-10-CM | POA: Diagnosis not present

## 2020-07-29 DIAGNOSIS — R2681 Unsteadiness on feet: Secondary | ICD-10-CM | POA: Diagnosis not present

## 2020-07-29 DIAGNOSIS — M6281 Muscle weakness (generalized): Secondary | ICD-10-CM | POA: Diagnosis not present

## 2020-07-29 DIAGNOSIS — R2689 Other abnormalities of gait and mobility: Secondary | ICD-10-CM | POA: Diagnosis not present

## 2020-07-29 NOTE — Therapy (Signed)
[facility] [address] [place], [place], [address] Phone: [phone]   Fax:  [phone]  Physical Therapy Treatment  Patient Details  Name: Derek [name] MRN: [id number] Date of Birth: 21-Jul-1976 Referring Provider (PT): Faith Rogue, MD   Encounter Date: 07/29/2020   PT End of Session - 07/29/20 1714    Visit Number 20    Number of Visits 26    Date for PT Re-Evaluation 09/24/20   POC for 6 weeks, Cert for 90 days   Authorization Type BCBS    PT Start Time 1534   pt arriving late   PT Stop Time 1615    PT Time Calculation (min) 41 min    Equipment Utilized During Treatment Gait belt    Activity Tolerance Patient tolerated treatment well    Behavior During Therapy [facility] for tasks assessed/performed;Flat affect           Past Medical History:  Diagnosis Date  . 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 Alcohol dependence ([facility])   . Back pain   . Marijuana dependence ([facility])   . Tobacco dependence     Past Surgical History:  Procedure Laterality Date  . BUBBLE STUDY  01/10/2020   Procedure: BUBBLE STUDY;  Surgeon: Sande Rives, MD;  [facility];  Service: Cardiovascular;;  . TEE WITHOUT CARDIOVERSION N/A 01/10/2020   Procedure: TRANSESOPHAGEAL ECHOCARDIOGRAM (TEE);  Surgeon: Sande Rives, MD;  Location: [facility] ENDOSCOPY;  Service: Cardiovascular;  Laterality: N/A;    There were no vitals filed for this visit.   Subjective Assessment - 07/29/20 1536    Subjective No new changes/complaints since last visit. No pain to report. No falls.    Pertinent History Alcohol and Tobacco Use    Limitations Standing;Walking;Sitting    How long can you stand comfortably? 3-5 minutes    Currently in Pain? No/denies    Pain Onset More than a month ago                             [facility] Adult PT Treatment/Exercise - 07/29/20 0001      Transfers   Transfers Sit to  Stand;Stand to Sit    Sit to Stand 5: Supervision    Sit to Stand Details Tactile cues for weight shifting;Verbal cues for technique;Manual facilitation for weight shifting    Stand to Sit 5: Supervision    Stand to Sit Details (indicate cue type and reason) Tactile cues for weight shifting;Manual facilitation for weight shifting    Stand Pivot Transfers 5: Supervision    Comments completed sit <> stand with mirror placed in front for feedback x 10 reps, PT providing manual faciliation for improved weight shift to LLE with standing and descent with completion.       Ambulation/Gait   Ambulation/Gait Yes    Ambulation/Gait Assistance 5: Supervision    Ambulation/Gait Assistance Details completed ambulation with hemiwalker    Ambulation Distance (Feet) 100 Feet    Assistive device Hemi-walker    Gait Pattern Step-through pattern;Decreased step length - left;Decreased stance time - left;Decreased step length - right;Decreased hip/knee flexion - left;Decreased dorsiflexion - left    Ambulation Surface Level;Indoor    Stairs Yes    Stairs Assistance 4: Min guard    Stairs Assistance Details (indicate cue type and reason) completed stair training including ascending/descending stairs with R Rail and Step To Pattern. patient demo proper pattern/sequencing  with ascending/descending stairs. PT educating patient's friend of proper way to complete stairs with patient and he currently needs CGA to allow for management of stairs and AD.     Stair Management Technique One rail Right;Step to pattern;Forwards    Number of Stairs 12    Height of Stairs 6      Neuro Re-ed    Neuro Re-ed Details  Standing without UE support and mirror placed in front of completed mini squats to promote improved hip/knee flexion x 10 reps, PT continue to provide manual faciliation for weight shift.       Exercises   Exercises Other Exercises    Other Exercises  Seated on mat with foam roll under LLE, completed hamstring  curls x 10 reps, PT providing Min A as needed, and verbal/tactile cues for improved control with completion. Standing with single UE support, completed alternating marching with PT providing Min A to LLE for improved hip/knee flexion, completed x 10 reps.                   PT Education - 07/29/20 1714    Education Details Patient educated patient's friend/cousin Hotel manager) on stair negotiation and need for CGA with completion    Person(s) Educated Patient;Other (comment)    Methods Explanation;Demonstration    Comprehension Verbalized understanding;Returned demonstration            PT Short Term Goals - 07/24/20 1620      PT SHORT TERM GOAL #1   Title Patient will report completing walking daily at home 2x/day (ALL STGs Due: 07/24/2020)    Baseline Patients report walking 3-4x/daily in the home w/ AD    Time 3    Period Weeks    Status Achieved    Target Date 07/24/20             PT Long Term Goals - 06/26/20 1622      PT LONG TERM GOAL #1   Title Patient will be independent and complaint with Final HEP and walking program with caregiver assistance (ALL LTGs: 08/07/20)    Baseline Patient reports completing HEP 1x/week, patient reports difficulty getting assistance to complete exercises.    Time 6    Period Weeks    Status Revised    Target Date 08/07/20      PT LONG TERM GOAL #2   Title Patient will demonstrate ability to ambulate > 500 ft w/ LRAD and Supv to demo improved community mobility    Baseline 400 ft w/ hemiwalker CGA    Time 6    Period Weeks    Status Revised      PT LONG TERM GOAL #3   Title Patient will improve TUG to </= 25 secs w/ LRAD to demo reduced risk for falls    Baseline 58.66 secs w/ hemiwalker, 31.19 secs w/ hemiwalker    Time 6    Period Weeks    Status Revised      PT LONG TERM GOAL #4   Title Patient will demo ability to ambulate at gait speed of >/= 2.0 ft/sec to demo improved functional mobility    Baseline 1.26 ft/sec    Time  8    Period Weeks    Status On-going      PT LONG TERM GOAL #5   Title Patient will demo ability to complete all bed mobility and transfers, Mod I, for improved independence    Baseline CGA for bed mobility and transfers  Time 8    Period Weeks    Status On-going                 Plan - 07/29/20 1715    Clinical Impression Statement PT educating patient and cousin Mellody Dance) on stair negotiation to allow for patient to complete at home. PT educating on need for CGA/supervision for stair negotiation due to safety concerns. Continued activities to promote improved ROM with LLE as tolerated by patient.    Personal Factors and Comorbidities Comorbidity 2    Comorbidities Chronic Back Pain,HTN, Prior History of Alcohol/Tobacco Use    Examination-Activity Limitations Bed Mobility;Stairs;Stand;Toileting;Transfers;Dressing    Examination-Participation Restrictions Community Activity;Driving;Yard Work;Other   Occupation   Stability/Clinical Decision Making Evolving/Moderate complexity    Rehab Potential Good    PT Frequency 2x / week    PT Duration 6 weeks    PT Treatment/Interventions ADLs/Self Care Home Management;Electrical Stimulation;Aquatic Therapy;Moist Heat;DME Instruction;Gait training;Stair training;Functional mobility training;Therapeutic activities;Therapeutic exercise;Balance training;Neuromuscular re-education;Patient/family education;Orthotic Fit/Training;Wheelchair mobility training;Manual techniques;Passive range of motion    PT Next Visit Plan Did we try stairs at home? standing activity to promote weight shift/balance, Stretching of LLE, SciFit, standing weight bearing.    Consulted and Agree with Plan of Care Patient           Patient will benefit from skilled therapeutic intervention in order to improve the following deficits and impairments:  Abnormal gait, Decreased balance, Decreased endurance, Decreased mobility, Difficulty walking, Impaired tone, Impaired  sensation, Pain, Impaired UE functional use, Impaired flexibility, Decreased strength, Decreased knowledge of use of DME, Decreased activity tolerance, Decreased coordination, Decreased range of motion, Increased muscle spasms  Visit Diagnosis: Spastic hemiplegia of left nondominant 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 (HCC)  Unsteadiness on feet  Muscle weakness (generalized)  Other abnormalities of gait and mobility     Problem List Patient Active Problem List   Diagnosis Date Noted  . Neuropathic pain 04/09/2020  . Pain   . Spastic hemiparesis (HCC)   . History of hypertension   . Dyslipidemia   . 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 Leukocytosis 01/12/2020  . Cerebrovascular accident (CVA) due to thrombosis of right middle cerebral artery (HCC)   . Chronic pain syndrome   . Hypokalemia   . Dysphagia, post-stroke   . 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 Tobacco dependence   . Marijuana dependence ([facility])   . Alcohol dependence ([facility])     Tempie Donning, PT, DPT 07/29/2020, 5:17 PM  [facility] [address] [place], [place], [address] Phone: [phone]   Fax:  [phone]  Name: Derek [name] MRN: [id number] Date of Birth: 12/30/1975

## 2020-07-31 ENCOUNTER — Encounter: Payer: Self-pay | Admitting: Physical Therapy

## 2020-07-31 ENCOUNTER — Ambulatory Visit: Payer: BC Managed Care – PPO | Admitting: Physical Therapy

## 2020-07-31 ENCOUNTER — Other Ambulatory Visit: Payer: Self-pay

## 2020-07-31 DIAGNOSIS — R2689 Other abnormalities of gait and mobility: Secondary | ICD-10-CM

## 2020-07-31 DIAGNOSIS — M6281 Muscle weakness (generalized): Secondary | ICD-10-CM

## 2020-07-31 DIAGNOSIS — R2681 Unsteadiness on feet: Secondary | ICD-10-CM

## 2020-07-31 DIAGNOSIS — R209 Unspecified disturbances of skin sensation: Secondary | ICD-10-CM | POA: Diagnosis not present

## 2020-07-31 DIAGNOSIS — M79602 Pain in left arm: Secondary | ICD-10-CM | POA: Diagnosis not present

## 2020-07-31 DIAGNOSIS — I69354 Hemiplegia and hemiparesis following cerebral infarction affecting left non-dominant side: Secondary | ICD-10-CM

## 2020-07-31 NOTE — Therapy (Signed)
[facility] [address] [place], [place], [address] Phone: [phone]   Fax:  [phone]  Physical Therapy Treatment  Patient Details  Name: Derek [name] MRN: [id number] Date of Birth: 18-Jul-1976 Referring Provider (PT): Faith Rogue, MD   Encounter Date: 07/31/2020   PT End of Session - 07/31/20 1538    Visit Number 21    Number of Visits 26    Date for PT Re-Evaluation 09/24/20   POC for 6 weeks, Cert for 90 days   Authorization Type BCBS    PT Start Time 1535   pt arrived late for session   PT Stop Time 1615    PT Time Calculation (min) 40 min    Equipment Utilized During Treatment Gait belt    Activity Tolerance Patient tolerated treatment well    Behavior During Therapy [facility] for tasks assessed/performed;Flat affect           Past Medical History:  Diagnosis Date  . 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 Alcohol dependence (HCC)   . Back pain   . Marijuana dependence (HCC)   . Tobacco dependence     Past Surgical History:  Procedure Laterality Date  . BUBBLE STUDY  01/10/2020   Procedure: BUBBLE STUDY;  Surgeon: Sande Rives, MD;  Location: [facility] ENDOSCOPY;  Service: Cardiovascular;;  . TEE WITHOUT CARDIOVERSION N/A 01/10/2020   Procedure: TRANSESOPHAGEAL ECHOCARDIOGRAM (TEE);  Surgeon: Sande Rives, MD;  Location[facility] ENDOSCOPY;  Service: Cardiovascular;  Laterality: N/A;    There were no vitals filed for this visit.   Subjective Assessment - 07/31/20 1537    Subjective No new complaints. No falls or pain to report. Reports his HEP is "so-so" due to limited assistance at home to assist him with it.    Pertinent History Alcohol and Tobacco Use    Limitations Standing;Walking;Sitting    How long can you stand comfortably? 3-5 minutes    Currently in Pain? No/denies    Pain Score 0-No pain                [facility] Adult PT Treatment/Exercise - 07/31/20 1539       Transfers   Transfers Sit to Stand;Stand to Sit    Sit to Stand 5: Supervision    Stand to Sit 5: Supervision      Ambulation/Gait   Ambulation/Gait Yes    Ambulation/Gait Assistance 4: Min guard    Ambulation/Gait Assistance Details 1st lap with hemiwalker with supervision. trialed large base quad cane with min guard to supervision for 2 laps. then tried small base quad cane for short distance with pt feeling too unstable with this device, min guard assist from PTA. Pt prefered the large base quad cane as he could use this in his home, where he can not use the hemiwalker. Pt provided information on how to obtain one.     Ambulation Distance (Feet) 115 Feet   x 3, 50 x1   Assistive device Hemi-walker;Large base quad cane;Small based quad cane    Gait Pattern Step-through pattern;Decreased step length - left;Decreased stance time - left;Decreased step length - right;Decreased hip/knee flexion - left;Decreased dorsiflexion - left    Ambulation Surface Level;Indoor      Neuro Re-ed    Neuro Re-ed Details  for balance/muscle re-ed: standing at stairs with right UE support only- left foot taps up/down bottom 2 steps for 10 reps with assist/cues for hip and knee flexion, not to slide  foot.                Balance Exercises - 07/31/20 1611      Balance Exercises: Standing   Standing Eyes Closed Wide (BOA);Head turns;Foam/compliant surface;Other reps (comment);30 secs;Limitations    Standing Eyes Closed Limitations on airex with no UE support: EC 30 seconds for 3 reps, progressing to EC head movements left<>right, up<>down for 10 reps each with min guard assist, no UE support. cues for increased left side weight bearing.                PT Short Term Goals - 07/24/20 1620      PT SHORT TERM GOAL #1   Title Patient will report completing walking daily at home 2x/day (ALL STGs Due: 07/24/2020)    Baseline Patients report walking 3-4x/daily in the home w/ AD    Time 3    Period Weeks     Status Achieved    Target Date 07/24/20             PT Long Term Goals - 06/26/20 1622      PT LONG TERM GOAL #1   Title Patient will be independent and complaint with Final HEP and walking program with caregiver assistance (ALL LTGs: 08/07/20)    Baseline Patient reports completing HEP 1x/week, patient reports difficulty getting assistance to complete exercises.    Time 6    Period Weeks    Status Revised    Target Date 08/07/20      PT LONG TERM GOAL #2   Title Patient will demonstrate ability to ambulate > 500 ft w/ LRAD and Supv to demo improved community mobility    Baseline 400 ft w/ hemiwalker CGA    Time 6    Period Weeks    Status Revised      PT LONG TERM GOAL #3   Title Patient will improve TUG to </= 25 secs w/ LRAD to demo reduced risk for falls    Baseline 58.66 secs w/ hemiwalker, 31.19 secs w/ hemiwalker    Time 6    Period Weeks    Status Revised      PT LONG TERM GOAL #4   Title Patient will demo ability to ambulate at gait speed of >/= 2.0 ft/sec to demo improved functional mobility    Baseline 1.26 ft/sec    Time 8    Period Weeks    Status On-going      PT LONG TERM GOAL #5   Title Patient will demo ability to complete all bed mobility and transfers, Mod I, for improved independence    Baseline CGA for bed mobility and transfers    Time 8    Period Weeks    Status On-going                 Plan - 07/31/20 1538    Clinical Impression Statement Today's skilled session focused initially on gait with various AD as pt reports his place is too small to use the hemiwalker to walk at home. After trials in session the pt prefers the large base quad cane and was provided with information/places to look to get one. Remainder of session focused on left LE strengthening/NMR and balance without UE support. The pt is progressing toward goals and should benefit from continued PT to progress toward unmet goals.    Personal Factors and Comorbidities  Comorbidity 2    Comorbidities Chronic Back Pain,HTN, Prior History of Alcohol/Tobacco Use  Examination-Activity Limitations Bed Mobility;Stairs;Stand;Toileting;Transfers;Dressing    Examination-Participation Restrictions Community Activity;Driving;Yard Work;Other   Occupation   Stability/Clinical Decision Making Evolving/Moderate complexity    Rehab Potential Good    PT Frequency 2x / week    PT Duration 6 weeks    PT Treatment/Interventions ADLs/Self Care Home Management;Electrical Stimulation;Aquatic Therapy;Moist Heat;DME Instruction;Gait training;Stair training;Functional mobility training;Therapeutic activities;Therapeutic exercise;Balance training;Neuromuscular re-education;Patient/family education;Orthotic Fit/Training;Wheelchair mobility training;Manual techniques;Passive range of motion    PT Next Visit Plan standing activity to promote weight shift/balance, Stretching of LLE, SciFit, standing weight bearing.    Consulted and Agree with Plan of Care Patient           Patient will benefit from skilled therapeutic intervention in order to improve the following deficits and impairments:  Abnormal gait, Decreased balance, Decreased endurance, Decreased mobility, Difficulty walking, Impaired tone, Impaired sensation, Pain, Impaired UE functional use, Impaired flexibility, Decreased strength, Decreased knowledge of use of DME, Decreased activity tolerance, Decreased coordination, Decreased range of motion, Increased muscle spasms  Visit Diagnosis: Spastic hemiplegia of left nondominant 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 (HCC)  Unsteadiness on feet  Muscle weakness (generalized)  Other abnormalities of gait and mobility     Problem List Patient Active Problem List   Diagnosis Date Noted  . Neuropathic pain 04/09/2020  . Pain   . Spastic hemiparesis (HCC)   . History of hypertension   . Dyslipidemia   . 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  Leukocytosis 01/12/2020  . Cerebrovascular accident (CVA) due to thrombosis of right middle cerebral artery (HCC)   . Chronic pain syndrome   . Hypokalemia   . Dysphagia, post-stroke   . 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 Tobacco dependence   . Marijuana dependence (HCC)   . Alcohol dependence (HCC)     Sallyanne Kuster, PTA, [facility] [address] [place], [place] [address] [phone] 07/31/20, 5:11 PM   Name: Derek [name] MRN: [id number] Date of Birth: October 19, 1976

## 2020-08-04 ENCOUNTER — Ambulatory Visit: Payer: BC Managed Care – PPO | Attending: Physical Medicine & Rehabilitation | Admitting: Occupational Therapy

## 2020-08-04 ENCOUNTER — Encounter: Payer: Self-pay | Admitting: Occupational Therapy

## 2020-08-04 DIAGNOSIS — R2681 Unsteadiness on feet: Secondary | ICD-10-CM | POA: Diagnosis not present

## 2020-08-04 DIAGNOSIS — I69354 Hemiplegia and hemiparesis following cerebral infarction affecting left non-dominant side: Secondary | ICD-10-CM | POA: Diagnosis not present

## 2020-08-04 DIAGNOSIS — M6281 Muscle weakness (generalized): Secondary | ICD-10-CM | POA: Insufficient documentation

## 2020-08-04 DIAGNOSIS — M79602 Pain in left arm: Secondary | ICD-10-CM | POA: Insufficient documentation

## 2020-08-04 DIAGNOSIS — R208 Other disturbances of skin sensation: Secondary | ICD-10-CM | POA: Insufficient documentation

## 2020-08-04 DIAGNOSIS — R2689 Other abnormalities of gait and mobility: Secondary | ICD-10-CM | POA: Insufficient documentation

## 2020-08-04 NOTE — Therapy (Signed)
[facility] [address] [place], [place], [address] Phone: [phone]   Fax:  [phone]  Occupational Therapy Treatment  Patient Details  Name: Derek [name] MRN: [id number] Date of Birth: [birth year] Referring Provider (OT): Dr. Augustina Mood   Encounter Date: 08/04/2020   OT End of Session - 08/04/20 1824    Visit Number 18    Number of Visits 26    Date for OT Re-Evaluation 09/26/20    Authorization Type BC/BS - 60 combined visits for all disciplines    Authorization - Visit Number 18    Authorization - Number of Visits 30    OT Start Time 1600    OT Stop Time 1635    OT Time Calculation (min) 35 min    Activity Tolerance Patient tolerated treatment well    Behavior During Therapy [facility] for tasks assessed/performed;Flat affect           Past Medical History:  Diagnosis Date  . Acute ischemic right MCA stroke ([place]) 01/07/2020  . Alcohol dependence ([place])   . Back pain   . Marijuana dependence ([place])   . Tobacco dependence     Past Surgical History:  Procedure Laterality Date  . BUBBLE STUDY  01/10/2020   Procedure: BUBBLE STUDY;  Surgeon: Geralynn Rile, MD;  Location: [place];  Service: Cardiovascular;;  . TEE WITHOUT CARDIOVERSION N/A 01/10/2020   Procedure: TRANSESOPHAGEAL ECHOCARDIOGRAM (TEE);  Surgeon: Geralynn Rile, MD;  Location: [place];  Service: Cardiovascular;  Laterality: N/A;    There were no vitals filed for this visit.   Subjective Assessment - 08/04/20 1822    Subjective  'It's really not hurting"    Patient is accompanied by: Family member    Pertinent History CVA 01/07/20 w/ residual Lt hemiparesis. PMH: HTN, ETOH abuse, smoker    Limitations fall risk    Currently in Pain? No/denies    Pain Score 0-No pain          Patient seen for aquatic therapy.  Patient arrived late to session.  Patient assisted into and out of pool.  Entered the pool via stairs with  mod assist to manage left foot placement.  Exited the pool via ramp with supervision, and at times without use of RUE for support on guard rail.   Worked in 3.5 feet water.  Initially worked on components of stepping - in stride position worked on active knee and hip alignment while de-rotating trunk.  Patient better able to hold postural alignment today.  Improved hip extension range noted and improved foot contact during stepping.   Worked in supported supine to address shoulder range of motion, with emphasis on external rotation and abduction.                        OT Education - 08/04/20 1823    Education Details Alignment and activation in LLE during walking    Person(s) Educated Patient    Methods Explanation    Comprehension Verbalized understanding            OT Short Term Goals - 07/28/20 1919      OT SHORT TERM GOAL #1   Title Passive stretching to LUE    Time 4    Period Weeks    Status On-going      OT SHORT TERM GOAL #2   Title Patient wil tolerate facilitation techniques to align body during transitional movement  Time 4    Period Weeks    Status New      OT SHORT TERM GOAL #3   Title Pt to verbalize understanding with pain management strategies and proper positioning of LUE    Time 4    Period Weeks    Status On-going      OT SHORT TERM GOAL #4   Baseline Patient to actively particiapte in bathing and/or dressing with max assist    Time 4    Period Weeks    Status New      OT SHORT TERM GOAL #5   Title Pt to perform UE dressing with min assist and LE dressing with mod assist    Baseline mod assist/max assist    Time 4    Period Weeks    Status Not Met      OT SHORT TERM GOAL #6   Title Pt to tolerate passive shoulder flexion to 90* supine w/ pain 5/10 or under    Status Achieved             OT Long Term Goals - 07/28/20 1922      OT LONG TERM GOAL #1   Title Pt to tolerate 75% PROM LUE with pain 3/10 or under    Time 8     Period Weeks    Status On-going      OT LONG TERM GOAL #2   Title Pt to use LUE as stabalizer for bilateral tasks    Time 8    Period Weeks    Status Not Met      OT LONG TERM GOAL #3   Title Pt to verbalize understanding with potential A/E needs and task modifications to increase ease with ADLS and simple IADLS    Time 8    Period Weeks    Status On-going      OT LONG TERM GOAL #4   Title Pt to be mod I level with bathing and UE dressing and min assist for LE dressing    Time 8    Period Weeks    Status Not Met      OT LONG TERM GOAL #5   Title Pt to perform toileting at mod I level    Time 8    Period Weeks    Status Achieved      OT LONG TERM GOAL #6   Title Pt to perform simple snack prep/sandwich prep from w/c level prn or w/ use of walker and walker tray    Time 8    Period Weeks    Status On-going                 Plan - 08/04/20 1825    Clinical Impression Statement Patient without shoulder pain this session without taking pain medication.  Patient more tolerant of shoulder gentle mobilization.    OT Occupational Profile and History Detailed Assessment- Review of Records and additional review of physical, cognitive, psychosocial history related to current functional performance    Occupational performance deficits (Please refer to evaluation for details): ADL's;IADL's;Rest and Sleep;Leisure;Social Participation    Body Structure / Function / Physical Skills ADL;ROM;Dexterity;Edema;Balance;IADL;Body mechanics;Improper spinal/pelvic alignment;Sensation;Mobility;Strength;Muscle spasms;Coordination;Tone;Pain;UE functional use;Decreased knowledge of use of DME;Vision    Rehab Potential Fair    Clinical Decision Making Several treatment options, min-mod task modification necessary    Comorbidities Affecting Occupational Performance: May have comorbidities impacting occupational performance    OT Frequency 2x / week    OT Duration  6 weeks    OT  Treatment/Interventions Self-care/ADL training;Therapeutic exercise;Functional Mobility Training;Aquatic Therapy;Neuromuscular education;Manual Therapy;Splinting;Therapeutic activities;Coping strategies training;DME and/or AE instruction;Cognitive remediation/compensation;Electrical Stimulation;Visual/perceptual remediation/compensation;Moist Heat;Passive range of motion;Patient/family education;Ultrasound    Plan continue NMR LUE and pain management    OT Home Exercise Plan Initiated supine body on arm with partial rolling - girlfriend not present    Consulted and Agree with Plan of Care Patient           Patient will benefit from skilled therapeutic intervention in order to improve the following deficits and impairments:   Body Structure / Function / Physical Skills: ADL, ROM, Dexterity, Edema, Balance, IADL, Body mechanics, Improper spinal/pelvic alignment, Sensation, Mobility, Strength, Muscle spasms, Coordination, Tone, Pain, UE functional use, Decreased knowledge of use of DME, Vision       Visit Diagnosis: Spastic hemiplegia of left nondominant 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 (HCC)  Unsteadiness on feet  Muscle weakness (generalized)  Other disturbances of skin sensation  Pain in left arm  Hemiplegia and hemiparesis following cerebral infarction affecting left non-dominant side [facility])    Problem List Patient Active Problem List   Diagnosis Date Noted  . Neuropathic pain 04/09/2020  . Pain   . Spastic hemiparesis ([place])   . History of hypertension   . Dyslipidemia   . 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 Leukocytosis 01/12/2020  . Cerebrovascular accident (CVA) due to thrombosis of right middle cerebral artery ([place])   . Chronic pain syndrome   . Hypokalemia   . Dysphagia, post-stroke   . Acute ischemic right MCA stroke ([place]) 01/07/2020  . Tobacco dependence   . Marijuana dependence ([place])   . Alcohol dependence [facility])     Mariah Milling, OTR/L 08/04/2020, 6:27 PM  [facility] [address] [place], [place], [address] Phone: [phone]   Fax:  [phone]  Name: Derek [name] MRN: [id number] Date of Birth: 13-Mar-1976

## 2020-08-05 ENCOUNTER — Other Ambulatory Visit: Payer: Self-pay

## 2020-08-05 ENCOUNTER — Ambulatory Visit: Payer: BC Managed Care – PPO

## 2020-08-05 DIAGNOSIS — I69354 Hemiplegia and hemiparesis following cerebral infarction affecting left non-dominant side: Secondary | ICD-10-CM

## 2020-08-05 DIAGNOSIS — M79602 Pain in left arm: Secondary | ICD-10-CM | POA: Diagnosis not present

## 2020-08-05 DIAGNOSIS — R2681 Unsteadiness on feet: Secondary | ICD-10-CM

## 2020-08-05 DIAGNOSIS — R2689 Other abnormalities of gait and mobility: Secondary | ICD-10-CM

## 2020-08-05 DIAGNOSIS — M6281 Muscle weakness (generalized): Secondary | ICD-10-CM | POA: Diagnosis not present

## 2020-08-05 DIAGNOSIS — R208 Other disturbances of skin sensation: Secondary | ICD-10-CM | POA: Diagnosis not present

## 2020-08-05 NOTE — Therapy (Signed)
[facility] [address] [place], [place], [address] Phone: [phone]   Fax:  [phone]  Physical Therapy Treatment  Patient Details  Name: Derek [name] MRN: [id number] Date of Birth: February 06, 1976 Referring Provider (PT): Faith Rogue, MD   Encounter Date: 08/05/2020   PT End of Session - 08/05/20 1454    Visit Number 22    Number of Visits 26    Date for PT Re-Evaluation 09/24/20   POC for 6 weeks, Cert for 90 days   Authorization Type BCBS    PT Start Time 1448    PT Stop Time 1530    PT Time Calculation (min) 42 min    Equipment Utilized During Treatment Gait belt    Activity Tolerance Patient tolerated treatment well    Behavior During Therapy [facility] for tasks assessed/performed;Flat affect           Past Medical History:  Diagnosis Date  . 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 Alcohol dependence (HCC)   . Back pain   . Marijuana dependence (HCC)   . Tobacco dependence     Past Surgical History:  Procedure Laterality Date  . BUBBLE STUDY  01/10/2020   Procedure: BUBBLE STUDY;  Surgeon: Sande Rives, MD;  Location: [facility] ENDOSCOPY;  Service: Cardiovascular;;  . TEE WITHOUT CARDIOVERSION N/A 01/10/2020   Procedure: TRANSESOPHAGEAL ECHOCARDIOGRAM (TEE);  Surgeon: Sande Rives, MD;  Location: [facility] ENDOSCOPY;  Service: Cardiovascular;  Laterality: N/A;    There were no vitals filed for this visit.   Subjective Assessment - 08/05/20 1453    Subjective Patient reports that aunt had large base quad cane that she will let him use, brought into session today. No falls to report.    Pertinent History Alcohol and Tobacco Use    Limitations Standing;Walking;Sitting    How long can you stand comfortably? 3-5 minutes    Currently in Pain? No/denies                             [facility] Adult PT Treatment/Exercise - 08/05/20 0001      Transfers   Transfers Sit to  Stand;Stand to Sit    Sit to Stand 5: Supervision    Stand to Sit 5: Supervision    Comments completed sit <> stands with BLE placed on mat to further challenge balance.       Ambulation/Gait   Ambulation/Gait Yes    Ambulation/Gait Assistance 5: Supervision;4: Min guard    Ambulation/Gait Assistance Details Continued ambulation w/ large base quad cane today x 230 ft. PT adjusting patient's personal large quad base cane to patient's height prior to ambulation.  PT verbalizing using this AD vs. hemiwalker to allow for AD use inside house.     Ambulation Distance (Feet) 230 Feet    Assistive device Large base quad cane    Gait Pattern Step-through pattern;Decreased step length - left;Decreased stance time - left;Decreased step length - right;Decreased hip/knee flexion - left;Decreased dorsiflexion - left    Ambulation Surface Level;Indoor    Stairs Yes    Stairs Assistance 5: Supervision;4: Min guard    Stairs Assistance Details (indicate cue type and reason) continued stair training, patient demonstrating improved carryover during session with ascend/descending stairs with proper sequencing. intermittent CGA required with completion.     Stair Management Technique One rail Right;Step to pattern;Forwards    Number of Stairs 8  Height of Stairs 6      Neuro Re-ed    Neuro Re-ed Details  completed standing balance on blue mat without UE support; including standing with narrow BOS and eyes closed 3 x 30 seconds, followed by narrow BOS eyes closed with horizontal/vertical head turns 2 x 15 reps. Increased balance challenge noted with vertical head turns, with increased sway noted. In supine, with LLE completed diagonal PNF D1 pattern passively x 5 reps, progressing patient actively completing flexion/extension with PT providing faciliation as needed x 10 reps.       Knee/Hip Exercises: Stretches   Other Knee/Hip Stretches PT completed manual stretching to DF, 4 x 30 seconds stretching to  patient's tolerance. PT completed single knee to chest stretch, 3 x 30 seconds.                     PT Short Term Goals - 07/24/20 1620      PT SHORT TERM GOAL #1   Title Patient will report completing walking daily at home 2x/day (ALL STGs Due: 07/24/2020)    Baseline Patients report walking 3-4x/daily in the home w/ AD    Time 3    Period Weeks    Status Achieved    Target Date 07/24/20             PT Long Term Goals - 06/26/20 1622      PT LONG TERM GOAL #1   Title Patient will be independent and complaint with Final HEP and walking program with caregiver assistance (ALL LTGs: 08/07/20)    Baseline Patient reports completing HEP 1x/week, patient reports difficulty getting assistance to complete exercises.    Time 6    Period Weeks    Status Revised    Target Date 08/07/20      PT LONG TERM GOAL #2   Title Patient will demonstrate ability to ambulate > 500 ft w/ LRAD and Supv to demo improved community mobility    Baseline 400 ft w/ hemiwalker CGA    Time 6    Period Weeks    Status Revised      PT LONG TERM GOAL #3   Title Patient will improve TUG to </= 25 secs w/ LRAD to demo reduced risk for falls    Baseline 58.66 secs w/ hemiwalker, 31.19 secs w/ hemiwalker    Time 6    Period Weeks    Status Revised      PT LONG TERM GOAL #4   Title Patient will demo ability to ambulate at gait speed of >/= 2.0 ft/sec to demo improved functional mobility    Baseline 1.26 ft/sec    Time 8    Period Weeks    Status On-going      PT LONG TERM GOAL #5   Title Patient will demo ability to complete all bed mobility and transfers, Mod I, for improved independence    Baseline CGA for bed mobility and transfers    Time 8    Period Weeks    Status On-going                 Plan - 08/05/20 1850    Clinical Impression Statement Today's skilled PT session included continued gait training with [employer], with patient demo improved ambulation with this AD  during session. Patient has got the AD for at home use at this time, with PT educating on use of this AD vs. Hemi walker to promote use  within home. Continued balance exercises and NMR to promote improved weight shift to LLE. Patient continues to require CGA intermittently. Will continue to progress toward all LTG's.    Personal Factors and Comorbidities Comorbidity 2    Comorbidities Chronic Back Pain,HTN, Prior History of Alcohol/Tobacco Use    Examination-Activity Limitations Bed Mobility;Stairs;Stand;Toileting;Transfers;Dressing    Examination-Participation Restrictions Community Activity;Driving;Yard Work;Other   Occupation   Stability/Clinical Decision Making Evolving/Moderate complexity    Rehab Potential Good    PT Frequency 2x / week    PT Duration 6 weeks    PT Treatment/Interventions ADLs/Self Care Home Management;Electrical Stimulation;Aquatic Therapy;Moist Heat;DME Instruction;Gait training;Stair training;Functional mobility training;Therapeutic activities;Therapeutic exercise;Balance training;Neuromuscular re-education;Patient/family education;Orthotic Fit/Training;Wheelchair mobility training;Manual techniques;Passive range of motion    PT Next Visit Plan Check Goals? + Re-Cert/Discharge. Standing activity to promote weight shift/balance, Stretching of LLE, SciFit, standing weight bearing.    Consulted and Agree with Plan of Care Patient           Patient will benefit from skilled therapeutic intervention in order to improve the following deficits and impairments:  Abnormal gait, Decreased balance, Decreased endurance, Decreased mobility, Difficulty walking, Impaired tone, Impaired sensation, Pain, Impaired UE functional use, Impaired flexibility, Decreased strength, Decreased knowledge of use of DME, Decreased activity tolerance, Decreased coordination, Decreased range of motion, Increased muscle spasms  Visit Diagnosis: Spastic hemiplegia of left nondominant side as late effect  of cerebral infarction (HCC)  Unsteadiness on feet  Muscle weakness (generalized)  Other abnormalities of gait and mobility     Problem List Patient Active Problem List   Diagnosis Date Noted  . Neuropathic pain 04/09/2020  . Pain   . Spastic hemiparesis (HCC)   . History of hypertension   . Dyslipidemia   . 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 Leukocytosis 01/12/2020  . Cerebrovascular accident (CVA) due to thrombosis of right middle cerebral artery (HCC)   . Chronic pain syndrome   . Hypokalemia   . Dysphagia, post-stroke   . 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 Tobacco dependence   . Marijuana dependence (HCC)   . Alcohol dependence (HCC)     Tempie Donning 08/05/2020, 6:53 PM  [facility] [address] [place], [place], [address] Phone: [phone]   Fax:  [phone]  Name: Derek [name] MRN: [id number] Date of Birth: 09-Mar-1976

## 2020-08-07 ENCOUNTER — Other Ambulatory Visit: Payer: Self-pay

## 2020-08-07 ENCOUNTER — Ambulatory Visit: Payer: BC Managed Care – PPO

## 2020-08-07 DIAGNOSIS — R2689 Other abnormalities of gait and mobility: Secondary | ICD-10-CM

## 2020-08-07 DIAGNOSIS — R2681 Unsteadiness on feet: Secondary | ICD-10-CM | POA: Diagnosis not present

## 2020-08-07 DIAGNOSIS — I69354 Hemiplegia and hemiparesis following cerebral infarction affecting left non-dominant side: Secondary | ICD-10-CM

## 2020-08-07 DIAGNOSIS — M6281 Muscle weakness (generalized): Secondary | ICD-10-CM

## 2020-08-07 DIAGNOSIS — M79602 Pain in left arm: Secondary | ICD-10-CM | POA: Diagnosis not present

## 2020-08-07 DIAGNOSIS — R208 Other disturbances of skin sensation: Secondary | ICD-10-CM | POA: Diagnosis not present

## 2020-08-07 NOTE — Therapy (Signed)
[facility] [address] [place], [place], [address] Phone: [phone]   Fax:  [phone]  Physical Therapy Treatment/Re-Certification  Patient Details  Name: Derek [name] MRN: [id number] Date of Birth: 08/21/1976 Referring Provider (PT): Alger Simons, MD   Encounter Date: 08/07/2020   PT End of Session - 08/07/20 1449    Visit Number 23    Number of Visits 27    Date for PT Re-Evaluation 11/05/20   POC for 4 weeks, Cert for 90 days   Authorization Type BCBS    PT Start Time 1446    PT Stop Time 1528    PT Time Calculation (min) 42 min    Equipment Utilized During Treatment Gait belt    Activity Tolerance Patient tolerated treatment well    Behavior During Therapy [facility] for tasks assessed/performed;Flat affect           Past Medical History:  Diagnosis Date   Acute ischemic right MCA stroke ([place]) 01/07/2020   Alcohol dependence ([place])    Back pain    Marijuana dependence ([place])    Tobacco dependence     Past Surgical History:  Procedure Laterality Date   BUBBLE STUDY  01/10/2020   Procedure: BUBBLE STUDY;  Surgeon: Geralynn Rile, MD;  Location: [place];  Service: Cardiovascular;;   TEE WITHOUT CARDIOVERSION N/A 01/10/2020   Procedure: TRANSESOPHAGEAL ECHOCARDIOGRAM (TEE);  Surgeon: Geralynn Rile, MD;  Location: [place];  Service: Cardiovascular;  Laterality: N/A;    There were no vitals filed for this visit.   Subjective Assessment - 08/07/20 1448    Subjective Patient reports that he has been using large base quad cane at home. No falls to report. No new changes/complaints since last visit.    Pertinent History Alcohol and Tobacco Use    Limitations Standing;Walking;Sitting    How long can you stand comfortably? 3-5 minutes    Currently in Pain? No/denies                  [facility] PT Assessment - 08/07/20 0001      Assessment   Medical Diagnosis Rt MCA CVA      Referring Provider (PT) Alger Simons, MD               [facility] Adult PT Treatment/Exercise - 08/07/20 0001      Bed Mobility   Bed Mobility Rolling Right;Rolling Left;Supine to Sit;Sit to Supine    Rolling Right Supervision/verbal cueing    Rolling Left Supervision/Verbal cueing    Supine to Sit Independent    Sit to Supine Independent      Transfers   Transfers Sit to Stand;Stand to Sit    Sit to Stand 6: Modified independent (Device/Increase time)    Stand to Sit 6: Modified independent (Device/Increase time)    Stand Pivot Transfers 6: Modified independent (Device/Increase time)    Stand Pivot Transfer Details (indicate cue type and reason) completed transfer from w/c <> mat with large base quad cane.     Comments patient able to complete transfers Mod I turning session today, still require supervision/verbal cueing for improved rolling to R/L for technique.       Ambulation/Gait   Ambulation/Gait Yes    Ambulation/Gait Assistance 5: Supervision;4: Min guard    Ambulation/Gait Assistance Details continued ambulation during with session with large base quad cane today, patient ambulating x 548 ft with supervision and intermittent CGA as needed.     Ambulation Distance (Feet)  548 Feet    Assistive device Large base quad cane    Gait Pattern Step-through pattern;Decreased step length - left;Decreased stance time - left;Decreased step length - right;Decreased hip/knee flexion - left;Decreased dorsiflexion - left    Ambulation Surface Level;Indoor    Gait velocity 21.01 secs = 1.56 ft/sec      Timed Up and Go Test   TUG Normal TUG    Normal TUG (seconds) 24.09                  PT Education - 08/07/20 1448    Education Details educated on progress toward all LTGs; placing therapy on hold at this time for potential Botox.    Person(s) Educated Patient    Methods Explanation    Comprehension Verbalized understanding            PT Short Term Goals - 07/24/20  1620      PT SHORT TERM GOAL #1   Title Patient will report completing walking daily at home 2x/day (ALL STGs Due: 07/24/2020)    Baseline Patients report walking 3-4x/daily in the home w/ AD    Time 3    Period Weeks    Status Achieved    Target Date 07/24/20             PT Long Term Goals - 08/07/20 1450      PT LONG TERM GOAL #1   Title Patient will be independent and complaint with Final HEP and walking program with caregiver assistance (ALL LTGs: 08/07/20)    Baseline patietn reports completing 2x/week, walking daily within home    Time 6    Period Weeks    Status Partially Met      PT LONG TERM GOAL #2   Title Patient will demonstrate ability to ambulate > 500 ft w/ LRAD and Supv to demo improved community mobility    Baseline 548 ft w/ supv/intermittent CGA with large base quad cane    Time 6    Period Weeks    Status Partially Met      PT LONG TERM GOAL #3   Title Patient will improve TUG to </= 25 secs w/ LRAD to demo reduced risk for falls    Baseline 58.66 secs w/ hemiwalker, 31.19 secs w/ hemiwalker, 24.03 secs w/ small base quad cane    Time 6    Period Weeks    Status Achieved      PT LONG TERM GOAL #4   Title Patient will demo ability to ambulate at gait speed of >/= 2.0 ft/sec to demo improved functional mobility    Baseline 1.26 ft/sec, 1.56 ft/sec    Time 8    Period Weeks    Status Not Met      PT LONG TERM GOAL #5   Title Patient will demo ability to complete all bed mobility and transfers, Mod I, for improved independence    Baseline Rolling R/L supv/cueing, all other aspects of bed mobility/transfers Mod I    Time 8    Period Weeks    Status Partially Met          Updated Short Term Goals:   PT Short Term Goals - 08/07/20 1852      PT SHORT TERM GOAL #1   Title = LTG           Updated Long Term Goals:      PT Long Term Goals - 08/07/20 1572      PT  LONG TERM GOAL #1   Title Patient will report walking > 10 minutes daily in  home for improved household mobility    Baseline walking <5 minutes at a time    Time 4    Period Weeks    Status New    Target Date 10/09/20   target date extended due to therapy on hold     PT LONG TERM GOAL #2   Title Patient will demonstrate ability to ambulate > 600 ft w/ LRAD at [place] on outdoor/indoor surfaces to demo improved community mobility    Baseline 548 ft w/ supv/intermittent indoors CGA with large base quad cane    Time 4    Period Weeks    Status Revised      PT LONG TERM GOAL #3   Title Patient will improve TUG to </= 20 secs w/ LRAD to demo reduced risk for falls    Baseline 58.66 secs w/ hemiwalker, 31.19 secs w/ hemiwalker, 24.03 secs w/ small base quad cane    Time 4    Period Weeks    Status Revised               Plan - 08/07/20 1849    Clinical Impression Statement Todays skilled PT session included assessment of patients progress toward LTGs. Patient demon progress with PT services, as patient able to meet LTG #3, with TUG score of 24.03 secs demonstrating improved functional mobility. Patient demonstrating progress toward all other goals, able to partially meet LTG #1,2, and 5. PT educating on progress toward all LTGs and improvements in functional mobility with PT services. Limited progress and improvements have been made regarding spasticity in LLE, which is continuing to limit progress with PT services. Progress has also been limited by lack of support at home with completion of HEP, demo decreased carryover throughout sessions. PT educating on hold at this time to determine if Botox in LLE is an option and will resume PT services if able to obtain Botox. Patient verbalizing understanding.    Personal Factors and Comorbidities Comorbidity 2    Comorbidities Chronic Back Pain,HTN, Prior History of Alcohol/Tobacco Use    Examination-Activity Limitations Bed Mobility;Stairs;Stand;Toileting;Transfers;Dressing    Examination-Participation Restrictions  Community Activity;Driving;Yard Work;Other   Occupation   Stability/Clinical Decision Making Evolving/Moderate complexity    Rehab Potential Good    PT Frequency 1x / week    PT Duration 4 weeks    PT Treatment/Interventions ADLs/Self Care Home Management;Electrical Stimulation;Aquatic Therapy;Moist Heat;DME Instruction;Gait training;Stair training;Functional mobility training;Therapeutic activities;Therapeutic exercise;Balance training;Neuromuscular re-education;Patient/family education;Orthotic Fit/Training;Wheelchair mobility training;Manual techniques;Passive range of motion    PT Next Visit Plan Did we get Botox? Have we completed HEP? Continue activites to promote LLE hip/knee flexion    Consulted and Agree with Plan of Care Patient           Patient will benefit from skilled therapeutic intervention in order to improve the following deficits and impairments:  Abnormal gait, Decreased balance, Decreased endurance, Decreased mobility, Difficulty walking, Impaired tone, Impaired sensation, Pain, Impaired UE functional use, Impaired flexibility, Decreased strength, Decreased knowledge of use of DME, Decreased activity tolerance, Decreased coordination, Decreased range of motion, Increased muscle spasms  Visit Diagnosis: Spastic hemiplegia of left nondominant 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 (HCC)  Unsteadiness on feet  Muscle weakness (generalized)  Other abnormalities of gait and mobility     Problem List Patient Active Problem List   Diagnosis Date Noted   Neuropathic pain 04/09/2020   Pain  Spastic hemiparesis (HCC)    History of hypertension    Dyslipidemia    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 [address] [place], [place], [address] Phone: [phone]   Fax:  [phone]  Name: SEKOU ZUCKERMAN MRN: [id number] Date of Birth: 07-25-1976

## 2020-08-09 DIAGNOSIS — I63511 Cerebral infarction due to unspecified occlusion or stenosis of right middle cerebral artery: Secondary | ICD-10-CM | POA: Diagnosis not present

## 2020-08-11 ENCOUNTER — Other Ambulatory Visit: Payer: Self-pay | Admitting: Registered Nurse

## 2020-08-11 ENCOUNTER — Telehealth: Payer: Self-pay

## 2020-08-11 DIAGNOSIS — G8110 Spastic hemiplegia affecting unspecified side: Secondary | ICD-10-CM

## 2020-08-11 NOTE — Telephone Encounter (Signed)
Dr. Riley Kill,   Derek [name] has been receiving PT services for Spastic Hemiplegia affecting the left side due to CVA. We have made good progress with functional mobility, however the severe spasticity in the LLE has limited progress with PT services and little improvements have been noted. The patient has received Botox in the LUE prior. I was reaching out to see about potential Botox for LLE. Please let me know your thoughts.    Thank you, Adelfa Koh, PT, DPT  [facility] [address] [place], [place]  [address] Phone:  [phone] Fax:  [phone]

## 2020-08-12 ENCOUNTER — Ambulatory Visit: Payer: BC Managed Care – PPO

## 2020-08-14 ENCOUNTER — Ambulatory Visit: Payer: BC Managed Care – PPO

## 2020-08-18 NOTE — Telephone Encounter (Signed)
Certainly something we can consider when he sees me back in November for follow up.  Thanks!

## 2020-08-18 NOTE — Telephone Encounter (Signed)
That sounds great.  Thank you

## 2020-08-20 ENCOUNTER — Encounter: Payer: BC Managed Care – PPO | Admitting: Physical Medicine & Rehabilitation

## 2020-08-27 ENCOUNTER — Encounter: Payer: Self-pay | Admitting: Occupational Therapy

## 2020-08-27 ENCOUNTER — Other Ambulatory Visit: Payer: Self-pay

## 2020-08-27 ENCOUNTER — Ambulatory Visit: Payer: BC Managed Care – PPO | Admitting: Occupational Therapy

## 2020-08-27 DIAGNOSIS — R2689 Other abnormalities of gait and mobility: Secondary | ICD-10-CM | POA: Diagnosis not present

## 2020-08-27 DIAGNOSIS — I69354 Hemiplegia and hemiparesis following cerebral infarction affecting left non-dominant side: Secondary | ICD-10-CM

## 2020-08-27 DIAGNOSIS — M6281 Muscle weakness (generalized): Secondary | ICD-10-CM

## 2020-08-27 DIAGNOSIS — M79602 Pain in left arm: Secondary | ICD-10-CM

## 2020-08-27 DIAGNOSIS — R2681 Unsteadiness on feet: Secondary | ICD-10-CM | POA: Diagnosis not present

## 2020-08-27 DIAGNOSIS — R208 Other disturbances of skin sensation: Secondary | ICD-10-CM | POA: Diagnosis not present

## 2020-08-27 NOTE — Therapy (Signed)
[facility] [address] [place], [place], [address] Phone: [phone]   Fax:  [phone]  Occupational Therapy Treatment  Patient Details  Name: Derek [name] MRN: [id number] Date of Birth: Oct 12, 1976 Referring Provider (OT): Dr. Augustina Mood   Encounter Date: 08/27/2020   OT End of Session - 08/27/20 1713    Visit Number 19    Number of Visits 26    Date for OT Re-Evaluation 09/26/20    Authorization Type BC/BS - 60 combined visits for all disciplines    Authorization - Visit Number 19    Authorization - Number of Visits 30    OT Start Time 1620    OT Stop Time 1700    OT Time Calculation (min) 40 min    Activity Tolerance Patient tolerated treatment well    Behavior During Therapy [facility] for tasks assessed/performed           Past Medical History:  Diagnosis Date  . Acute ischemic right MCA stroke ([place]) 01/07/2020  . Alcohol dependence ([place])   . Back pain   . Marijuana dependence ([place])   . Tobacco dependence     Past Surgical History:  Procedure Laterality Date  . BUBBLE STUDY  01/10/2020   Procedure: BUBBLE STUDY;  Surgeon: Geralynn Rile, MD;  Location: [facility];  Service: Cardiovascular;;  . TEE WITHOUT CARDIOVERSION N/A 01/10/2020   Procedure: TRANSESOPHAGEAL ECHOCARDIOGRAM (TEE);  Surgeon: Geralynn Rile, MD;  Location: [facility];  Service: Cardiovascular;  Laterality: N/A;    There were no vitals filed for this visit.   Subjective Assessment - 08/27/20 1707    Subjective  Reports being out of Baclofen - encouraged patient to refill - highlighted benefit of consistent usage to control muscle tension    Pertinent History CVA 01/07/20 w/ residual Lt hemiparesis. PMH: HTN, ETOH abuse, smoker    Limitations fall risk    Currently in Pain? No/denies    Pain Score 0-No pain                        OT Treatments/Exercises (OP) - 08/27/20 0001      Neurological  Re-education Exercises   Other Exercises 1 Neuromuscular reeducation to address first overactivation of muscles in shoulder girdle, neck and upper trunk, and scapular mobilizations. Patient without report of pain.  Worked on moving body onto stable left arm to promote increased range at glenohumeral joint.  Initially tolerated well, then with report of discomfort.  Repositioned x1 and patient able to tolerate - worked on attempts to isolate elbow flexion/extension and forearm rotation.  Patient with delayed motor response, and due to significant sensory deficits and apraxia - had difficulty accessing right muscle for right action.  Worked in standing to address weight shift onto LLE while holding long PVC pipe to support weight of arm - worked on shoulder flexion with elbow extension, and shoulder extension with elbow flexion patterns, and shoulder abd/add patterns.                    OT Education - 08/27/20 1712    Education Details Importance of consistent medication management    Person(s) Educated Patient    Methods Explanation    Comprehension Verbalized understanding;Need further instruction            OT Short Term Goals - 08/27/20 1723      OT SHORT TERM GOAL #1   Title Passive stretching to  LUE    Time 4    Period Weeks    Status Achieved      OT SHORT TERM GOAL #2   Title Patient wil tolerate facilitation techniques to align body during transitional movement    Time 4    Period Weeks    Status Achieved      OT SHORT TERM GOAL #3   Title Pt to verbalize understanding with pain management strategies and proper positioning of LUE    Time 4    Period Weeks    Status On-going      OT SHORT TERM GOAL #4   Baseline Patient to actively particiapte in bathing and/or dressing with max assist    Time 4    Period Weeks    Status On-going      OT SHORT TERM GOAL #5   Title Pt to perform UE dressing with min assist and LE dressing with mod assist    Baseline mod  assist/max assist    Time 4    Period Weeks    Status Not Met      OT SHORT TERM GOAL #6   Title Pt to tolerate passive shoulder flexion to 90* supine w/ pain 5/10 or under    Status Achieved             OT Long Term Goals - 08/27/20 1723      OT LONG TERM GOAL #1   Title Pt to tolerate 75% PROM LUE with pain 3/10 or under    Time 8    Period Weeks    Status On-going      OT LONG TERM GOAL #2   Title Pt to use LUE as stabalizer for bilateral tasks    Time 8    Period Weeks    Status Not Met      OT LONG TERM GOAL #3   Title Pt to verbalize understanding with potential A/E needs and task modifications to increase ease with ADLS and simple IADLS    Time 8    Period Weeks    Status On-going      OT LONG TERM GOAL #4   Title Pt to be mod I level with bathing and UE dressing and min assist for LE dressing    Time 8    Period Weeks    Status Not Met      OT LONG TERM GOAL #5   Title Pt to perform toileting at mod I level    Time 8    Period Weeks    Status Achieved      OT LONG TERM GOAL #6   Title Pt to perform simple snack prep/sandwich prep from w/c level prn or w/ use of walker and walker tray    Time 8    Period Weeks    Status On-going                 Plan - 08/27/20 1714    Clinical Impression Statement Patient without shoulder pain this session without taking pain medication.  Overall tolerating more shoulder motion, although hs difficult time reproducing motion due to significant sensory and perceptual deficits    OT Occupational Profile and History Detailed Assessment- Review of Records and additional review of physical, cognitive, psychosocial history related to current functional performance    Occupational performance deficits (Please refer to evaluation for details): ADL's;IADL's;Rest and Sleep;Leisure;Social Participation    Body Structure / Function / Physical Skills ADL;ROM;Dexterity;Edema;Balance;IADL;Body mechanics;Improper spinal/pelvic  alignment;Sensation;Mobility;Strength;Muscle spasms;Coordination;Tone;Pain;UE functional use;Decreased knowledge of use of DME;Vision    Rehab Potential Fair    Clinical Decision Making Several treatment options, min-mod task modification necessary    Comorbidities Affecting Occupational Performance: May have comorbidities impacting occupational performance    OT Frequency 2x / week    OT Duration 6 weeks    OT Treatment/Interventions Self-care/ADL training;Therapeutic exercise;Functional Mobility Training;Aquatic Therapy;Neuromuscular education;Manual Therapy;Splinting;Therapeutic activities;Coping strategies training;DME and/or AE instruction;Cognitive remediation/compensation;Electrical Stimulation;Visual/perceptual remediation/compensation;Moist Heat;Passive range of motion;Patient/family education;Ultrasound    Plan continue NMR LUE and pain management    OT Home Exercise Plan Initiated supine body on arm with partial rolling - girlfriend not present    Consulted and Agree with Plan of Care Patient           Patient will benefit from skilled therapeutic intervention in order to improve the following deficits and impairments:   Body Structure / Function / Physical Skills: ADL, ROM, Dexterity, Edema, Balance, IADL, Body mechanics, Improper spinal/pelvic alignment, Sensation, Mobility, Strength, Muscle spasms, Coordination, Tone, Pain, UE functional use, Decreased knowledge of use of DME, Vision       Visit Diagnosis: 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 Neuropathic pain 04/09/2020  . Pain   . Spastic hemiparesis ([place])   . History of hypertension   . Dyslipidemia   . 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 Leukocytosis 01/12/2020   . Cerebrovascular accident (CVA) due to thrombosis of right middle cerebral artery ([place])   . Chronic pain syndrome   . Hypokalemia   . Dysphagia, post-stroke   . Acute ischemic right MCA stroke ([place]) 01/07/2020  . Tobacco dependence   . Marijuana dependence ([place])   . Alcohol dependence ([place])     Mariah Milling, OTR/L 08/27/2020, 5:25 PM  [facility] [address] [place], [place], [address] Phone: [phone]   Fax:  [phone]  Name: Derek [name] MRN: [id number] Date of Birth: [birth year]

## 2020-09-04 ENCOUNTER — Other Ambulatory Visit: Payer: Self-pay

## 2020-09-04 ENCOUNTER — Ambulatory Visit: Payer: BC Managed Care – PPO | Attending: Physical Medicine & Rehabilitation | Admitting: Occupational Therapy

## 2020-09-04 ENCOUNTER — Encounter: Payer: Self-pay | Admitting: Occupational Therapy

## 2020-09-04 DIAGNOSIS — M6281 Muscle weakness (generalized): Secondary | ICD-10-CM | POA: Diagnosis not present

## 2020-09-04 DIAGNOSIS — R2681 Unsteadiness on feet: Secondary | ICD-10-CM | POA: Insufficient documentation

## 2020-09-04 DIAGNOSIS — I69354 Hemiplegia and hemiparesis following cerebral infarction affecting left non-dominant side: Secondary | ICD-10-CM

## 2020-09-04 DIAGNOSIS — R208 Other disturbances of skin sensation: Secondary | ICD-10-CM | POA: Insufficient documentation

## 2020-09-04 DIAGNOSIS — M79602 Pain in left arm: Secondary | ICD-10-CM | POA: Insufficient documentation

## 2020-09-04 NOTE — Therapy (Signed)
[facility] [address] [place], [place], [address] Phone: [phone]   Fax:  [phone]  Occupational Therapy Treatment  Patient Details  Name: Derek [name] MRN: [id number] Date of Birth: 06/21/1976 Referring Provider (OT): Dr. Augustina Mood   Encounter Date: 09/04/2020   OT End of Session - 09/04/20 1503    Visit Number 20    Number of Visits 26    Date for OT Re-Evaluation 09/26/20    Authorization Type BC/BS - 60 combined visits for all disciplines    Authorization - Visit Number 20    Authorization - Number of Visits 30    OT Start Time 1400    OT Stop Time 1445    OT Time Calculation (min) 45 min    Activity Tolerance Patient tolerated treatment well    Behavior During Therapy [facility] for tasks assessed/performed           Past Medical History:  Diagnosis Date  . Acute ischemic right MCA stroke ([place]) 01/07/2020  . Alcohol dependence ([place])   . Back pain   . Marijuana dependence ([place])   . Tobacco dependence     Past Surgical History:  Procedure Laterality Date  . BUBBLE STUDY  01/10/2020   Procedure: BUBBLE STUDY;  Surgeon: Geralynn Rile, MD;  Location: [facility];  Service: Cardiovascular;;  . TEE WITHOUT CARDIOVERSION N/A 01/10/2020   Procedure: TRANSESOPHAGEAL ECHOCARDIOGRAM (TEE);  Surgeon: Geralynn Rile, MD;  Location: [facility];  Service: Cardiovascular;  Laterality: N/A;    There were no vitals filed for this visit.   Subjective Assessment - 09/04/20 1413    Subjective  Patient again has no pain in shoulder at beginning of session.  He attributes to consistently taking medication (baclofen)    Pertinent History CVA 01/07/20 w/ residual Lt hemiparesis. PMH: HTN, ETOH abuse, smoker    Currently in Pain? No/denies    Pain Score 0-No pain                        OT Treatments/Exercises (OP) - 09/04/20 0001      Neurological Re-education Exercises   Other  Exercises 1 Neuromuscular reeducation to address overactive neck and shoulder muscles to allow joint motion.  Rolling onto left side to attempt isolated elbow, forearm control. Patient needs increased time and assistance to transition fronm sit to supine and supine to sit toward left side.  Patient still very guarded wittransitions in this direction.  Sit to stand with min assist to maintain midline alignment.  Patient needs min assist and increased time to don front opening jacket.                      OT Short Term Goals - 09/04/20 1506      OT SHORT TERM GOAL #1   Title Passive stretching to LUE    Time 4    Period Weeks    Status Achieved      OT SHORT TERM GOAL #2   Title Patient wil tolerate facilitation techniques to align body during transitional movement    Time 4    Period Weeks    Status Achieved      OT SHORT TERM GOAL #3   Title Pt to verbalize understanding with pain management strategies and proper positioning of LUE    Time 4    Period Weeks    Status Achieved      OT SHORT  TERM GOAL #4   Baseline Patient to actively particiapte in bathing and/or dressing with max assist    Time 4    Period Weeks    Status On-going      OT SHORT TERM GOAL #5   Title Pt to perform UE dressing with min assist and LE dressing with mod assist    Baseline mod assist/max assist    Time 4    Period Weeks    Status Not Met      OT SHORT TERM GOAL #6   Title Pt to tolerate passive shoulder flexion to 90* supine w/ pain 5/10 or under    Status Achieved             OT Long Term Goals - 09/04/20 1506      OT LONG TERM GOAL #1   Title Pt to tolerate 75% PROM LUE with pain 3/10 or under    Time 8    Period Weeks    Status Achieved      OT LONG TERM GOAL #2   Title Pt to use LUE as stabalizer for bilateral tasks    Time 8    Period Weeks    Status Not Met      OT LONG TERM GOAL #3   Title Pt to verbalize understanding with potential A/E needs and task  modifications to increase ease with ADLS and simple IADLS    Time 8    Period Weeks    Status On-going      OT LONG TERM GOAL #4   Title Pt to be mod I level with bathing and UE dressing and min assist for LE dressing    Time 8    Period Weeks    Status Not Met      OT LONG TERM GOAL #5   Title Pt to perform toileting at mod I level    Time 8    Period Weeks    Status Achieved      OT LONG TERM GOAL #6   Title Pt to perform simple snack prep/sandwich prep from w/c level prn or w/ use of walker and walker tray    Time 8    Period Weeks    Status On-going                 Plan - 09/04/20 1503    Clinical Impression Statement Patient has shown significant improvement with shoulder pain, but has limited active motion in LUE.  Patient's girlfriend has been unable to attend therapy to improve carryover to home.    OT Occupational Profile and History Detailed Assessment- Review of Records and additional review of physical, cognitive, psychosocial history related to current functional performance    Occupational performance deficits (Please refer to evaluation for details): ADL's;IADL's;Rest and Sleep;Leisure;Social Participation    Body Structure / Function / Physical Skills ADL;ROM;Dexterity;Edema;Balance;IADL;Body mechanics;Improper spinal/pelvic alignment;Sensation;Mobility;Strength;Muscle spasms;Coordination;Tone;Pain;UE functional use;Decreased knowledge of use of DME;Vision    Rehab Potential Fair    Clinical Decision Making Several treatment options, min-mod task modification necessary    Comorbidities Affecting Occupational Performance: May have comorbidities impacting occupational performance    OT Frequency 2x / week    OT Duration 6 weeks    OT Treatment/Interventions Self-care/ADL training;Therapeutic exercise;Functional Mobility Training;Aquatic Therapy;Neuromuscular education;Manual Therapy;Splinting;Therapeutic activities;Coping strategies training;DME and/or AE  instruction;Cognitive remediation/compensation;Electrical Stimulation;Visual/perceptual remediation/compensation;Moist Heat;Passive range of motion;Patient/family education;Ultrasound    Plan continue NMR LUE and pain management, question if  day time wrist splint might be  helpful at this point    OT Home Exercise Plan Initiated supine body on arm with partial rolling - girlfriend not present    Consulted and Agree with Plan of Care Patient           Patient will benefit from skilled therapeutic intervention in order to improve the following deficits and impairments:   Body Structure / Function / Physical Skills: ADL, ROM, Dexterity, Edema, Balance, IADL, Body mechanics, Improper spinal/pelvic alignment, Sensation, Mobility, Strength, Muscle spasms, Coordination, Tone, Pain, UE functional use, Decreased knowledge of use of DME, Vision       Visit Diagnosis: 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 Neuropathic pain 04/09/2020  . Pain   . Spastic hemiparesis ([place])   . History of hypertension   . Dyslipidemia   . 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 Leukocytosis 01/12/2020  . Cerebrovascular accident (CVA) due to thrombosis of right middle cerebral artery ([place])   . Chronic pain syndrome   . Hypokalemia   . Dysphagia, post-stroke   . Acute ischemic right MCA stroke ([place]) 01/07/2020  . Tobacco dependence   . Marijuana dependence ([place])   . Alcohol dependence ([place])     Mariah Milling, OTR/L 09/04/2020, 3:07 PM  [facility] [address] [place], [place], [address] Phone: [phone]   Fax:  [phone]  Name: Derek [name] MRN: [id number] Date of Birth:  [birth year]

## 2020-09-09 ENCOUNTER — Other Ambulatory Visit: Payer: Self-pay | Admitting: Physical Medicine & Rehabilitation

## 2020-09-09 DIAGNOSIS — G8110 Spastic hemiplegia affecting unspecified side: Secondary | ICD-10-CM

## 2020-09-09 DIAGNOSIS — I63511 Cerebral infarction due to unspecified occlusion or stenosis of right middle cerebral artery: Secondary | ICD-10-CM | POA: Diagnosis not present

## 2020-09-11 ENCOUNTER — Other Ambulatory Visit: Payer: Self-pay

## 2020-09-11 ENCOUNTER — Ambulatory Visit: Payer: BC Managed Care – PPO | Admitting: Occupational Therapy

## 2020-09-11 ENCOUNTER — Encounter: Payer: Self-pay | Admitting: Occupational Therapy

## 2020-09-11 DIAGNOSIS — M6281 Muscle weakness (generalized): Secondary | ICD-10-CM

## 2020-09-11 DIAGNOSIS — R208 Other disturbances of skin sensation: Secondary | ICD-10-CM | POA: Diagnosis not present

## 2020-09-11 DIAGNOSIS — R2681 Unsteadiness on feet: Secondary | ICD-10-CM

## 2020-09-11 DIAGNOSIS — I69354 Hemiplegia and hemiparesis following cerebral infarction affecting left non-dominant side: Secondary | ICD-10-CM | POA: Diagnosis not present

## 2020-09-11 DIAGNOSIS — M79602 Pain in left arm: Secondary | ICD-10-CM | POA: Diagnosis not present

## 2020-09-11 NOTE — Therapy (Signed)
[facility] [address] [place], [place], [address] Phone: [phone]   Fax:  [phone]  Occupational Therapy Treatment  Patient Details  Name: Derek [name] MRN: [id number] Date of Birth: Apr 28, 1976 Referring Provider (OT): Dr. Augustina Mood   Encounter Date: 09/11/2020   OT End of Session - 09/11/20 1905    Visit Number 21    Number of Visits 26    Date for OT Re-Evaluation 09/26/20    Authorization Type BC/BS - 60 combined visits for all disciplines    Authorization - Visit Number 21    Authorization - Number of Visits 30    OT Start Time 7169    OT Stop Time 1615    OT Time Calculation (min) 45 min    Activity Tolerance Patient tolerated treatment well    Behavior During Therapy [facility] for tasks assessed/performed           Past Medical History:  Diagnosis Date  . Acute ischemic right MCA stroke ([place]) 01/07/2020  . Alcohol dependence ([place])   . Back pain   . Marijuana dependence ([place])   . Tobacco dependence     Past Surgical History:  Procedure Laterality Date  . BUBBLE STUDY  01/10/2020   Procedure: BUBBLE STUDY;  Surgeon: Geralynn Rile, MD;  Location: [facility];  Service: Cardiovascular;;  . TEE WITHOUT CARDIOVERSION N/A 01/10/2020   Procedure: TRANSESOPHAGEAL ECHOCARDIOGRAM (TEE);  Surgeon: Geralynn Rile, MD;  Location: [facility];  Service: Cardiovascular;  Laterality: N/A;    There were no vitals filed for this visit.   Subjective Assessment - 09/11/20 1900    Subjective  Patient reports no pain in shoulder at start of session    Pertinent History CVA 01/07/20 w/ residual Lt hemiparesis. PMH: HTN, ETOH abuse, smoker    Limitations fall risk    Currently in Pain? No/denies    Pain Score 0-No pain                        OT Treatments/Exercises (OP) - 09/11/20 0001      Moist Heat Therapy   Number Minutes Moist Heat 10 Minutes    Moist Heat Location  Shoulder      Electrical Stimulation   Electrical Stimulation Location left forearm    Electrical Stimulation Action Neuromuscular reeducation    Electrical Stimulation Parameters 10 sec on/off cycle, 50pps, symmetrical waveform, synchronous cycling, ramp time 2 sec,  15    Electrical Stimulation Goals Neuromuscular facilitation      Manual Therapy   Manual Therapy Passive ROM;Scapular mobilization    Scapular Mobilization All planes to reduce stiffness.      Passive ROM [place] Joint toward flex/abd/ ER                    OT Short Term Goals - 09/11/20 1906      OT SHORT TERM GOAL #1   Title Passive stretching to LUE    Time 4    Period Weeks    Status Achieved      OT SHORT TERM GOAL #2   Title Patient wil tolerate facilitation techniques to align body during transitional movement    Time 4    Period Weeks    Status Achieved      OT SHORT TERM GOAL #3   Title Pt to verbalize understanding with pain management strategies and proper positioning of LUE    Time 4  Period Weeks    Status Achieved      OT SHORT TERM GOAL #4   Baseline Patient to actively particiapte in bathing and/or dressing with max assist    Time 4    Period Weeks    Status On-going      OT SHORT TERM GOAL #5   Title Pt to perform UE dressing with min assist and LE dressing with mod assist    Baseline mod assist/max assist    Time 4    Period Weeks    Status Not Met      OT SHORT TERM GOAL #6   Title Pt to tolerate passive shoulder flexion to 90* supine w/ pain 5/10 or under    Status Achieved             OT Long Term Goals - 09/11/20 1906      OT LONG TERM GOAL #1   Title Pt to tolerate 75% PROM LUE with pain 3/10 or under    Time 8    Period Weeks    Status Achieved      OT LONG TERM GOAL #2   Title Pt to use LUE as stabalizer for bilateral tasks    Time 8    Period Weeks    Status Not Met      OT LONG TERM GOAL #3   Title Pt to verbalize understanding with potential A/E  needs and task modifications to increase ease with ADLS and simple IADLS    Time 8    Period Weeks    Status On-going      OT LONG TERM GOAL #4   Title Pt to be mod I level with bathing and UE dressing and min assist for LE dressing    Time 8    Period Weeks    Status Not Met      OT LONG TERM GOAL #5   Title Pt to perform toileting at mod I level    Time 8    Period Weeks    Status Achieved      OT LONG TERM GOAL #6   Title Pt to perform simple snack prep/sandwich prep from w/c level prn or w/ use of walker and walker tray    Time 8    Period Weeks    Status On-going                 Plan - 09/11/20 1906    Clinical Impression Statement Patient has shown significant improvement with shoulder pain, but has limited active motion in LUE.  Patient's girlfriend has been unable to attend therapy to improve carryover to home.    OT Occupational Profile and History Detailed Assessment- Review of Records and additional review of physical, cognitive, psychosocial history related to current functional performance    Occupational performance deficits (Please refer to evaluation for details): ADL's;IADL's;Rest and Sleep;Leisure;Social Participation    Body Structure / Function / Physical Skills ADL;ROM;Dexterity;Edema;Balance;IADL;Body mechanics;Improper spinal/pelvic alignment;Sensation;Mobility;Strength;Muscle spasms;Coordination;Tone;Pain;UE functional use;Decreased knowledge of use of DME;Vision    Rehab Potential Fair    Clinical Decision Making Several treatment options, min-mod task modification necessary    Comorbidities Affecting Occupational Performance: May have comorbidities impacting occupational performance    OT Frequency 2x / week    OT Duration 6 weeks    OT Treatment/Interventions Self-care/ADL training;Therapeutic exercise;Functional Mobility Training;Aquatic Therapy;Neuromuscular education;Manual Therapy;Splinting;Therapeutic activities;Coping strategies training;DME  and/or AE instruction;Cognitive remediation/compensation;Electrical Stimulation;Visual/perceptual remediation/compensation;Moist Heat;Passive range of motion;Patient/family education;Ultrasound    Plan continue  NMR LUE and pain management, question if  day time wrist splint might be helpful at this point    OT Home Exercise Plan Initiated supine body on arm with partial rolling - girlfriend not present    Consulted and Agree with Plan of Care Patient           Patient will benefit from skilled therapeutic intervention in order to improve the following deficits and impairments:   Body Structure / Function / Physical Skills: ADL, ROM, Dexterity, Edema, Balance, IADL, Body mechanics, Improper spinal/pelvic alignment, Sensation, Mobility, Strength, Muscle spasms, Coordination, Tone, Pain, UE functional use, Decreased knowledge of use of DME, Vision       Visit Diagnosis: 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 Neuropathic pain 04/09/2020  . Pain   . Spastic hemiparesis ([place])   . History of hypertension   . Dyslipidemia   . 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 Leukocytosis 01/12/2020  . Cerebrovascular accident (CVA) due to thrombosis of right middle cerebral artery ([place])   . Chronic pain syndrome   . Hypokalemia   . Dysphagia, post-stroke   . Acute ischemic right MCA stroke ([place]) 01/07/2020  . Tobacco dependence   . Marijuana dependence ([place])   . Alcohol dependence ([place])     Mariah Milling, OTR/L 09/11/2020, 7:07 PM  [facility] [address] [place], [place], [address] Phone: [phone]   Fax:  [phone]  Name: TEDDY REBSTOCK MRN: [id number] Date of  Birth: 1976-04-15

## 2020-09-12 ENCOUNTER — Other Ambulatory Visit: Payer: Self-pay | Admitting: *Deleted

## 2020-09-12 DIAGNOSIS — G8110 Spastic hemiplegia affecting unspecified side: Secondary | ICD-10-CM

## 2020-09-12 DIAGNOSIS — M792 Neuralgia and neuritis, unspecified: Secondary | ICD-10-CM

## 2020-09-12 MED ORDER — PREGABALIN 50 MG PO CAPS: 50.0000 mg | ORAL_CAPSULE | Freq: Three times a day (TID) | ORAL | 4 refills | Status: DC

## 2020-09-18 ENCOUNTER — Other Ambulatory Visit: Payer: Self-pay | Admitting: *Deleted

## 2020-09-18 ENCOUNTER — Ambulatory Visit: Payer: BC Managed Care – PPO

## 2020-09-18 ENCOUNTER — Ambulatory Visit: Payer: BC Managed Care – PPO | Admitting: Occupational Therapy

## 2020-09-23 ENCOUNTER — Ambulatory Visit: Payer: BC Managed Care – PPO

## 2020-09-23 ENCOUNTER — Ambulatory Visit: Payer: BC Managed Care – PPO | Admitting: Occupational Therapy

## 2020-09-30 ENCOUNTER — Ambulatory Visit: Payer: BC Managed Care – PPO | Admitting: Occupational Therapy

## 2020-10-02 ENCOUNTER — Ambulatory Visit: Payer: BC Managed Care – PPO

## 2020-10-08 ENCOUNTER — Encounter: Payer: Self-pay | Admitting: Physical Medicine & Rehabilitation

## 2020-10-08 ENCOUNTER — Encounter
Payer: BC Managed Care – PPO | Attending: Physical Medicine & Rehabilitation | Admitting: Physical Medicine & Rehabilitation

## 2020-10-08 ENCOUNTER — Other Ambulatory Visit: Payer: Self-pay

## 2020-10-08 VITALS — BP 121/83 | HR 76 | Temp 98.1°F | Ht 71.0 in | Wt 238.0 lb

## 2020-10-08 DIAGNOSIS — G8110 Spastic hemiplegia affecting unspecified side: Secondary | ICD-10-CM

## 2020-10-08 NOTE — Patient Instructions (Signed)
PLEASE FEEL FREE TO CALL OUR OFFICE WITH ANY PROBLEMS OR QUESTIONS ([phone])                                @                 @@               @@@                        @@@@                      @@@@@         @@@@@@                  @@@@@@@                @@@@@@@@              @@@@@@@@@             @@@@@@@@@@       IIII                  IIII                                                        HAPPY HOLIDAYS!!!!!  

## 2020-10-08 NOTE — Progress Notes (Signed)
Subjective:    Patient ID: Derek [name], male    DOB: [birth year], 44 y.o.   MRN: [id number]  HPI  Here for botox  Pain Inventory Average Pain 4 Pain Right Now 4 My pain is intermittent  In the last 24 hours, has pain interfered with the following? General activity n/a Relation with others n/a Enjoyment of life n/a What TIME of day is your pain at its worst? varies Sleep (in general) poor-fair  Pain is worse with: walking Pain improves with: medication Relief from Meds: 7  Family History  Problem Relation Age of Onset  . Stroke Neg Hx   . CAD Neg Hx    Social History   Socioeconomic History  . Marital status: Single    Spouse name: Not on file  . Number of children: Not on file  . Years of education: Not on file  . Highest education level: Not on file  Occupational History  . Occupation: truck Hospital doctor  Tobacco Use  . Smoking status: Current Every Day Smoker    Packs/day: 0.20    Types: Cigarettes  . Smokeless tobacco: Never Used  Vaping Use  . Vaping Use: Never used  Substance and Sexual Activity  . Alcohol use: Yes    Comment: 2-3 times a week.   . Drug use: Yes    Types: Marijuana  . Sexual activity: Yes    Birth control/protection: Condom  Other Topics Concern  . Not on file  Social History Narrative  . Not on file   Social Determinants of Health   Financial Resource Strain:   . Difficulty of Paying Living Expenses: Not on file  Food Insecurity:   . Worried About Programme researcher, broadcasting/film/video in the Last Year: Not on file  . Ran Out of Food in the Last Year: Not on file  Transportation Needs:   . Lack of Transportation (Medical): Not on file  . Lack of Transportation (Non-Medical): Not on file  Physical Activity:   . Days of Exercise per Week: Not on file  . Minutes of Exercise per Session: Not on file  Stress:   . Feeling of Stress : Not on file  Social Connections:   . Frequency of Communication with Friends and Family: Not on file  . Frequency  of Social Gatherings with Friends and Family: Not on file  . Attends Religious Services: Not on file  . Active Member of Clubs or Organizations: Not on file  . Attends Banker Meetings: Not on file  . Marital Status: Not on file   Past Surgical History:  Procedure Laterality Date  . BUBBLE STUDY  01/10/2020   Procedure: BUBBLE STUDY;  Surgeon: Sande Rives, MD;  Location: [facility] ENDOSCOPY;  Service: Cardiovascular;;  . TEE WITHOUT CARDIOVERSION N/A 01/10/2020   Procedure: TRANSESOPHAGEAL ECHOCARDIOGRAM (TEE);  Surgeon: Sande Rives, MD;  Location: [facility] ENDOSCOPY;  Service: Cardiovascular;  Laterality: N/A;   Past Surgical History:  Procedure Laterality Date  . BUBBLE STUDY  01/10/2020   Procedure: BUBBLE STUDY;  Surgeon: Sande Rives, MD;  Location: [facility] ENDOSCOPY;  Service: Cardiovascular;;  . TEE WITHOUT CARDIOVERSION N/A 01/10/2020   Procedure: TRANSESOPHAGEAL ECHOCARDIOGRAM (TEE);  Surgeon: Sande Rives, MD;  Location: [facility] ENDOSCOPY;  Service: Cardiovascular;  Laterality: N/A;   Past Medical History:  Diagnosis Date  . 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 Alcohol dependence (HCC)   . Back pain   . Marijuana dependence (HCC)   .  Tobacco dependence    BP 121/83   Pulse 76   Temp 98.1 F (36.7 C)   Ht 5\' 11"  (1.803 m)   Wt 238 lb (108 kg)   SpO2 95%   BMI 33.19 kg/m   Opioid Risk Score:   Fall Risk Score:  `1  Depression screen PHQ 2/9  Depression screen [facility] 2/9 06/18/2020 02/27/2020  Decreased Interest 0 0  Down, Depressed, Hopeless 0 0  PHQ - 2 Score 0 0  Altered sleeping - 0  Tired, decreased energy - 1  Change in appetite - 0  Feeling bad or failure about yourself  - 0  Trouble concentrating - 0  Moving slowly or fidgety/restless - 0  Suicidal thoughts - 0  PHQ-9 Score - 1  Difficult doing work/chores - Somewhat difficult    Review of Systems  Constitutional: Negative.   HENT: Negative.   Eyes:  Negative.   Respiratory: Negative.   Cardiovascular: Negative.   Gastrointestinal: Negative.   Endocrine: Negative.   Genitourinary: Negative.   Musculoskeletal: Positive for arthralgias.  Skin: Negative.   Neurological: Negative.   Hematological: Negative.   Psychiatric/Behavioral: Negative.   All other systems reviewed and are negative.      Objective:   Physical Exam   Spastic lue and lle     Assessment & Plan:  Botox Injection for spasticity of lower extremity using needle EMG guidance Indication: No diagnosis found.   Dilution: 100 Units/ml        Total Units Injected: 400 Indication: Severe spasticity which interferes with ADL,mobility and/or  hygiene and is unresponsive to medication management and other conservative care Informed consent was obtained after describing risks and benefits of the procedure with the patient. This includes bleeding, bruising, infection, excessive weakness, or medication side effects. A REMS form is on file and signed.  Needle: 70mm injectable monopolar needle electrode   left Number of units per muscle  Quadriceps 300 units, 6 access points Gastroc/soleus 100 units 2 access points Hamstrings 0 units Tibialis Posterior 0 units Tibialis Anterior 0 units EHL 0 units All injections were done after obtaining appropriate EMG activity and after negative drawback for blood. The patient tolerated the procedure well. Post procedure instructions were given. No follow-ups on file.

## 2020-10-09 ENCOUNTER — Encounter: Payer: Self-pay | Admitting: Occupational Therapy

## 2020-10-09 ENCOUNTER — Ambulatory Visit: Payer: BC Managed Care – PPO

## 2020-10-09 DIAGNOSIS — I63511 Cerebral infarction due to unspecified occlusion or stenosis of right middle cerebral artery: Secondary | ICD-10-CM | POA: Diagnosis not present

## 2020-10-09 NOTE — Therapy (Signed)
[name] [address] [place], [place], [address] Phone: [phone]   Fax:  [phone]  Occupational Therapy Discharge Summary  Patient Details  Name: Derek [name] MRN: [id number] Date of Birth: May 04, 1976 Referring Provider (OT): Dr. Augustina Mood   Encounter Date: 10/09/2020    Past Medical History:  Diagnosis Date  . Acute ischemic right MCA stroke ([place]) 01/07/2020  . Alcohol dependence ([place])   . Back pain   . Marijuana dependence ([place])   . Tobacco dependence     Past Surgical History:  Procedure Laterality Date  . BUBBLE STUDY  01/10/2020   Procedure: BUBBLE STUDY;  Surgeon: Geralynn Rile, MD;  Location: [facility];  Service: Cardiovascular;;  . TEE WITHOUT CARDIOVERSION N/A 01/10/2020   Procedure: TRANSESOPHAGEAL ECHOCARDIOGRAM (TEE);  Surgeon: Geralynn Rile, MD;  Location: [facility];  Service: Cardiovascular;  Laterality: N/A;    There were no vitals filed for this visit.    Patient did not return for final three visits.  No call/No show.   Patient is being discharged from OT- Unable to comment on final goal status                       OT Short Term Goals - 09/11/20 1906      OT SHORT TERM GOAL #1   Title Passive stretching to LUE    Time 4    Period Weeks    Status Achieved      OT SHORT TERM GOAL #2   Title Patient wil tolerate facilitation techniques to align body during transitional movement    Time 4    Period Weeks    Status Achieved      OT SHORT TERM GOAL #3   Title Pt to verbalize understanding with pain management strategies and proper positioning of LUE    Time 4    Period Weeks    Status Achieved      OT SHORT TERM GOAL #4   Baseline Patient to actively particiapte in bathing and/or dressing with max assist    Time 4    Period Weeks    Status On-going      OT SHORT TERM GOAL #5   Title Pt to perform UE dressing with min  assist and LE dressing with mod assist    Baseline mod assist/max assist    Time 4    Period Weeks    Status Not Met      OT SHORT TERM GOAL #6   Title Pt to tolerate passive shoulder flexion to 90* supine w/ pain 5/10 or under    Status Achieved             OT Long Term Goals - 09/11/20 1906      OT LONG TERM GOAL #1   Title Pt to tolerate 75% PROM LUE with pain 3/10 or under    Time 8    Period Weeks    Status Achieved      OT LONG TERM GOAL #2   Title Pt to use LUE as stabalizer for bilateral tasks    Time 8    Period Weeks    Status Not Met      OT LONG TERM GOAL #3   Title Pt to verbalize understanding with potential A/E needs and task modifications to increase ease with ADLS and simple IADLS    Time 8    Period Weeks    Status  On-going      OT LONG TERM GOAL #4   Title Pt to be mod I level with bathing and UE dressing and min assist for LE dressing    Time 8    Period Weeks    Status Not Met      OT LONG TERM GOAL #5   Title Pt to perform toileting at mod I level    Time 8    Period Weeks    Status Achieved      OT LONG TERM GOAL #6   Title Pt to perform simple snack prep/sandwich prep from w/c level prn or w/ use of walker and walker tray    Time 8    Period Weeks    Status On-going                  Patient will benefit from skilled therapeutic intervention in order to improve the following deficits and impairments:           Visit Diagnosis: No diagnosis found.    Problem List Patient Active Problem List   Diagnosis Date Noted  . Neuropathic pain 04/09/2020  . Pain   . Spastic hemiparesis ([place])   . History of hypertension   . Dyslipidemia   . 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 Leukocytosis 01/12/2020  . Cerebrovascular accident (CVA) due to thrombosis of right middle cerebral artery ([place])   . Chronic pain syndrome   . Hypokalemia   . Dysphagia, post-stroke   . Acute ischemic right MCA stroke ([place])  01/07/2020  . Tobacco dependence   . Marijuana dependence ([place])   . Alcohol dependence [facility])     Mariah Milling 10/09/2020, 1:22 PM  [facility] [address] [place], [place], [address] Phone: [phone]   Fax:  [phone]  Name: Derek [name] MRN: [id number] Date of Birth: 01-14-1976

## 2020-10-26 ENCOUNTER — Other Ambulatory Visit: Payer: Self-pay | Admitting: Physical Medicine & Rehabilitation

## 2020-10-30 ENCOUNTER — Encounter (HOSPITAL_COMMUNITY): Payer: Self-pay

## 2020-10-30 ENCOUNTER — Ambulatory Visit (HOSPITAL_COMMUNITY)
Admission: EM | Admit: 2020-10-30 | Discharge: 2020-10-30 | Disposition: A | Discharge status: Home / Self Care | Payer: BC Managed Care – PPO | Attending: Urgent Care | Admitting: Urgent Care

## 2020-10-30 ENCOUNTER — Other Ambulatory Visit: Payer: Self-pay

## 2020-10-30 DIAGNOSIS — Z79899 Other long term (current) drug therapy: Secondary | ICD-10-CM | POA: Diagnosis not present

## 2020-10-30 DIAGNOSIS — R070 Pain in throat: Secondary | ICD-10-CM

## 2020-10-30 DIAGNOSIS — F17210 Nicotine dependence, cigarettes, uncomplicated: Secondary | ICD-10-CM | POA: Diagnosis not present

## 2020-10-30 DIAGNOSIS — Z8673 Personal history of transient ischemic attack (TIA), and cerebral infarction without residual deficits: Secondary | ICD-10-CM | POA: Insufficient documentation

## 2020-10-30 DIAGNOSIS — Z7982 Long term (current) use of aspirin: Secondary | ICD-10-CM | POA: Insufficient documentation

## 2020-10-30 DIAGNOSIS — R52 Pain, unspecified: Secondary | ICD-10-CM

## 2020-10-30 DIAGNOSIS — R051 Acute cough: Secondary | ICD-10-CM | POA: Diagnosis not present

## 2020-10-30 DIAGNOSIS — R519 Headache, unspecified: Secondary | ICD-10-CM

## 2020-10-30 DIAGNOSIS — U071 COVID-19: Secondary | ICD-10-CM | POA: Insufficient documentation

## 2020-10-30 DIAGNOSIS — B349 Viral infection, unspecified: Secondary | ICD-10-CM

## 2020-10-30 DIAGNOSIS — R059 Cough, unspecified: Secondary | ICD-10-CM

## 2020-10-30 LAB — SARS CORONAVIRUS 2 (TAT 6-24 HRS): SARS Coronavirus 2: POSITIVE — AB

## 2020-10-30 MED ORDER — CETIRIZINE HCL 10 MG PO TABS: 10.0000 mg | ORAL_TABLET | Freq: Every day | ORAL | 0 refills | Status: DC

## 2020-10-30 MED ORDER — ACETAMINOPHEN 160 MG/5ML PO SOLN
640.0000 mg | Freq: Once | ORAL | Status: AC
  Administered 2020-10-30: 640 mg via ORAL

## 2020-10-30 MED ORDER — PROMETHAZINE-DM 6.25-15 MG/5ML PO SYRP: 5.0000 mL | ORAL_SOLUTION | Freq: Every evening | ORAL | 0 refills | Status: DC | PRN

## 2020-10-30 MED ORDER — BENZONATATE 100 MG PO CAPS: 100.0000 mg | ORAL_CAPSULE | Freq: Three times a day (TID) | ORAL | 0 refills | Status: DC | PRN

## 2020-10-30 MED ORDER — ACETAMINOPHEN 325 MG PO TABS: 650.0000 mg | ORAL_TABLET | Freq: Once | ORAL | Status: DC

## 2020-10-30 MED ORDER — ACETAMINOPHEN 160 MG/5ML PO SUSP
ORAL | Status: AC
  Filled 2020-10-30: qty 20

## 2020-10-30 NOTE — Discharge Instructions (Addendum)
We will notify you of your COVID-19 test results as they arrive and may take between 24-48 hours. If you sign up for mychart, you will have online access to your electronic chart and becomes very easy to see your results.  In the meantime, if you develop symptoms including fever, chest pain, shortness of breath despite our current treatment plan then please report to the emergency room as this may be a sign of worsening status from possible COVID-19 infection.  If you develop symptoms the following are general recommendations for supportive care: for sore throat or cough try using a honey-based tea. Use 3 teaspoons of honey with juice squeezed from half lemon. Place shaved pieces of ginger into 1/2-1 cup of water and warm over stove top. Then mix the ingredients and repeat every 4 hours as needed. Please take Tylenol 325mg every 6 hours for aches and pains, fevers. Hydrate very well with at least 2 liters of water. Eat light meals such as soups to replenish electrolytes and soft fruits, veggies. Start an antihistamine like Zyrtec (cetirizine) at 10mg daily for postnasal drainage, sinus congestion.  

## 2020-10-30 NOTE — ED Provider Notes (Addendum)
[facility]   MRN: [id number] DOB: Jun 10, 1976  Subjective:   Derek [name] is a 44 y.o. male presenting for 2 to 3-day history of acute onset moderate to severe body aches, coughing, headache, general malaise and fatigue.  Patient was around family members that are also sick.  He is Covid vaccinated but wants to make sure he does not have this.  Does not have flu vaccination.  Denies confusion, vision change, chest pain, difficulty breathing.  Patient had a history of massive stroke this past year.  States that his symptoms are very different despite the headache.  He is no longer using alcohol.  Patient is compliant with his medications.  No current facility-administered medications for this encounter.  Current Outpatient Medications:  .  aspirin 325 MG tablet, Take 1 tablet (325 mg total) by mouth daily., Disp: 30 tablet, Rfl: 0 .  atorvastatin (LIPITOR) 80 MG tablet, Take 1 tablet by mouth daily., Disp: , Rfl:  .  baclofen (LIORESAL) 20 MG tablet, TAKE 1 TABLET BY MOUTH THREE TIMES DAILY, Disp: 90 tablet, Rfl: 3 .  ferrous sulfate 325 (65 FE) MG tablet, Take 325 mg by mouth 2 (two) times daily., Disp: , Rfl: 2 .  folic acid (FOLVITE) 1 MG tablet, Take 1 tablet (1 mg total) by mouth daily., Disp: 30 tablet, Rfl: 5 .  lidocaine (LIDODERM) 5 %, Place 1 patch onto the skin daily. Remove & Discard patch within 12 hours or as directed by MD, Disp: 30 patch, Rfl: 0 .  metoprolol tartrate (LOPRESSOR) 25 MG tablet, Take 1 tablet (25 mg total) by mouth 2 (two) times daily., Disp: 60 tablet, Rfl: 11 .  Multiple Vitamin (MULTIVITAMIN WITH MINERALS) TABS tablet, Take 1 tablet by mouth daily., Disp: 30 tablet, Rfl: 0 .  oxyCODONE (OXY IR/ROXICODONE) 5 MG immediate release tablet, Take 1 tablet (5 mg total) by mouth daily as needed for moderate pain., Disp: 30 tablet, Rfl: 0 .  pregabalin (LYRICA) 50 MG capsule, Take 1 capsule (50 mg total) by mouth 3 (three) times daily., Disp: 90  capsule, Rfl: 4 .  tiZANidine (ZANAFLEX) 2 MG tablet, TAKE 1 TABLET BY MOUTH THREE TIMES DAILY, Disp: 90 tablet, Rfl: 3   No Known Allergies  Past Medical History:  Diagnosis Date  . 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 Alcohol dependence (HCC)   . Back pain   . Marijuana dependence (HCC)   . Tobacco dependence      Past Surgical History:  Procedure Laterality Date  . BUBBLE STUDY  01/10/2020   Procedure: BUBBLE STUDY;  Surgeon: Sande Rives, MD;  Location: [facility] ENDOSCOPY;  Service: Cardiovascular;;  . TEE WITHOUT CARDIOVERSION N/A 01/10/2020   Procedure: TRANSESOPHAGEAL ECHOCARDIOGRAM (TEE);  Surgeon: Sande Rives, MD;  Location: [facility] ENDOSCOPY;  Service: Cardiovascular;  Laterality: N/A;    Family History  Problem Relation Age of Onset  . Stroke Neg Hx   . CAD Neg Hx     Social History   Tobacco Use  . Smoking status: Current Every Day Smoker    Packs/day: 0.20    Types: Cigarettes  . Smokeless tobacco: Never Used  Vaping Use  . Vaping Use: Never used  Substance Use Topics  . Alcohol use: Yes    Comment: 2-3 times a week.   . Drug use: Yes    Types: Marijuana    ROS   Objective:   Vitals: BP (!) 153/98 (BP Location: Right Arm)  Pulse (!) 113   Temp 99.5 F (37.5 C) (Oral)   Resp 19   SpO2 96%   Physical Exam Constitutional:      General: He is not in acute distress.    Appearance: Normal appearance. He is well-developed. He is not ill-appearing, toxic-appearing or diaphoretic.  HENT:     Head: Normocephalic and atraumatic.     Right Ear: External ear normal.     Left Ear: External ear normal.     Nose: Nose normal.     Mouth/Throat:     Mouth: Mucous membranes are moist.     Pharynx: Oropharynx is clear.  Eyes:     General: No scleral icterus.    Extraocular Movements: Extraocular movements intact.     Pupils: Pupils are equal, round, and reactive to light.  Cardiovascular:     Rate and Rhythm: Regular rhythm.  Tachycardia present.     Heart sounds: Normal heart sounds. No murmur heard. No friction rub. No gallop.   Pulmonary:     Effort: Pulmonary effort is normal. No respiratory distress.     Breath sounds: Normal breath sounds. No stridor. No wheezing, rhonchi or rales.  Neurological:     Mental Status: He is alert and oriented to person, place, and time.     Motor: Weakness present.     Coordination: Coordination abnormal (Ambulates in wheelchair).     Gait: Gait abnormal (Ambulates in wheelchair).     Comments: Left-sided deficits that are unchanged, left-sided facial droop also unchanged.  Weakness of the left arm unchanged.  Psychiatric:        Mood and Affect: Mood normal.        Behavior: Behavior normal.        Thought Content: Thought content normal.      Assessment and Plan :   PDMP not reviewed this encounter.  1. Viral syndrome   2. Body aches   3. Cough   4. Throat pain   5. Generalized headache     Will manage for viral illness such as viral URI, viral syndrome, viral influenza, COVID-19. Counseled patient on nature of COVID-19 including modes of transmission, diagnostic testing, management and supportive care.  Offered scripts for symptomatic relief.  Case discussed with Dr. Delton See and will hold off on sending patient to the emergency room as I do not suspect his headache is related to recurrent stroke.  It is likely that his borderline tachycardia is related to viral syndrome such as influenza or COVID-19.  Patient does not have any chest symptoms and therefore did not perform an EKG.  COVID 19 testing is pending. Counseled patient on potential for adverse effects with medications prescribed/recommended today, ER and return-to-clinic precautions discussed, patient verbalized understanding.     Wallis Bamberg, [place] 10/30/20 [id number]

## 2020-10-30 NOTE — ED Triage Notes (Signed)
Pt in with c/o headache and body aches that has been going on for a few days  Pt took ibuprofen with no relief  Denies N/V, diarrhea

## 2020-11-03 ENCOUNTER — Ambulatory Visit: Payer: BC Managed Care – PPO | Admitting: Adult Health

## 2020-11-03 ENCOUNTER — Encounter: Payer: Self-pay | Admitting: *Deleted

## 2020-11-03 ENCOUNTER — Telehealth: Payer: Self-pay | Admitting: *Deleted

## 2020-11-03 NOTE — Telephone Encounter (Signed)
Patient has appt today at 2:45pm. Our office is closing early due to weather. Left message, on both listed numbers, to call our office to be rescheuled. I will also send him a mychart message.

## 2020-11-07 ENCOUNTER — Encounter: Payer: Self-pay | Admitting: Physical Therapy

## 2020-11-07 NOTE — Therapy (Signed)
[facility] [address] [place], [place], [address] Phone: [phone]   Fax:  [phone]  Patient Details  Name: Derek [name] MRN: [id number] Date of Birth: February 23, 1976 Referring Provider:  No ref. provider found  Encounter Date: 11/07/2020  PHYSICAL THERAPY NON VISIT DISCHARGE SUMMARY  Visits from Start of Care: 23  Current functional level related to goals / functional outcomes: Patient is being discharged from PT services due to not returning since last visit. Patient had made progress toward all STG/LTG during POC.    Remaining deficits: Decreased Strength, Abnormal Gait, Impaired Balance, Fall Risk   Education / Equipment: HEP provided  Plan: Patient agrees to discharge.  Patient goals were not met. Patient is being discharged due to not returning since the last visit.  ?????            PT Short Term Goals - 08/07/20 1852      PT SHORT TERM GOAL #1   Title = LTG           PT Long Term Goals - 08/07/20 1852      PT LONG TERM GOAL #1   Title Patient will report walking > 10 minutes daily in home for improved household mobility    Baseline walking <5 minutes at a time    Time 4    Period Weeks    Status New    Target Date 10/09/20   target date extended due to therapy on hold     PT LONG TERM GOAL #2   Title Patient will demonstrate ability to ambulate > 600 ft w/ LRAD at [place] on outdoor/indoor surfaces to demo improved community mobility    Baseline 548 ft w/ supv/intermittent indoors CGA with large base quad cane    Time 4    Period Weeks    Status Revised      PT LONG TERM GOAL #3   Title Patient will improve TUG to </= 20 secs w/ LRAD to demo reduced risk for falls    Baseline 58.66 secs w/ hemiwalker, 31.19 secs w/ hemiwalker, 24.03 secs w/ small base quad cane    Time 4    Period Weeks    Status Revised            Jones Bales, PT, DPT 11/07/2020, 11:18 AM  [facility] [address] [place], [place], [address] Phone: [phone]   Fax:  [phone]

## 2020-11-09 DIAGNOSIS — I63511 Cerebral infarction due to unspecified occlusion or stenosis of right middle cerebral artery: Secondary | ICD-10-CM | POA: Diagnosis not present

## 2020-11-10 ENCOUNTER — Other Ambulatory Visit: Payer: Self-pay | Admitting: Physical Medicine & Rehabilitation

## 2020-11-10 DIAGNOSIS — I63511 Cerebral infarction due to unspecified occlusion or stenosis of right middle cerebral artery: Secondary | ICD-10-CM

## 2020-11-25 DIAGNOSIS — I69054 Hemiplegia and hemiparesis following nontraumatic subarachnoid hemorrhage affecting left non-dominant side: Secondary | ICD-10-CM | POA: Diagnosis not present

## 2020-11-25 DIAGNOSIS — E7800 Pure hypercholesterolemia, unspecified: Secondary | ICD-10-CM | POA: Diagnosis not present

## 2020-11-25 DIAGNOSIS — I6930 Unspecified sequelae of cerebral infarction: Secondary | ICD-10-CM | POA: Diagnosis not present

## 2020-11-25 DIAGNOSIS — I10 Essential (primary) hypertension: Secondary | ICD-10-CM | POA: Diagnosis not present

## 2020-12-03 DIAGNOSIS — I6529 Occlusion and stenosis of unspecified carotid artery: Secondary | ICD-10-CM | POA: Diagnosis not present

## 2020-12-03 DIAGNOSIS — E7800 Pure hypercholesterolemia, unspecified: Secondary | ICD-10-CM | POA: Diagnosis not present

## 2020-12-03 DIAGNOSIS — I69354 Hemiplegia and hemiparesis following cerebral infarction affecting left non-dominant side: Secondary | ICD-10-CM | POA: Diagnosis not present

## 2020-12-03 DIAGNOSIS — D509 Iron deficiency anemia, unspecified: Secondary | ICD-10-CM | POA: Diagnosis not present

## 2020-12-03 DIAGNOSIS — Z87891 Personal history of nicotine dependence: Secondary | ICD-10-CM | POA: Diagnosis not present

## 2020-12-03 DIAGNOSIS — I10 Essential (primary) hypertension: Secondary | ICD-10-CM | POA: Diagnosis not present

## 2020-12-03 DIAGNOSIS — Z7982 Long term (current) use of aspirin: Secondary | ICD-10-CM | POA: Diagnosis not present

## 2020-12-03 DIAGNOSIS — Z9181 History of falling: Secondary | ICD-10-CM | POA: Diagnosis not present

## 2020-12-05 DIAGNOSIS — Z9181 History of falling: Secondary | ICD-10-CM | POA: Diagnosis not present

## 2020-12-05 DIAGNOSIS — E7800 Pure hypercholesterolemia, unspecified: Secondary | ICD-10-CM | POA: Diagnosis not present

## 2020-12-05 DIAGNOSIS — Z87891 Personal history of nicotine dependence: Secondary | ICD-10-CM | POA: Diagnosis not present

## 2020-12-05 DIAGNOSIS — I10 Essential (primary) hypertension: Secondary | ICD-10-CM | POA: Diagnosis not present

## 2020-12-05 DIAGNOSIS — I69354 Hemiplegia and hemiparesis following cerebral infarction affecting left non-dominant side: Secondary | ICD-10-CM | POA: Diagnosis not present

## 2020-12-05 DIAGNOSIS — I6529 Occlusion and stenosis of unspecified carotid artery: Secondary | ICD-10-CM | POA: Diagnosis not present

## 2020-12-05 DIAGNOSIS — Z7982 Long term (current) use of aspirin: Secondary | ICD-10-CM | POA: Diagnosis not present

## 2020-12-05 DIAGNOSIS — D509 Iron deficiency anemia, unspecified: Secondary | ICD-10-CM | POA: Diagnosis not present

## 2020-12-09 ENCOUNTER — Encounter: Payer: Self-pay | Admitting: *Deleted

## 2020-12-10 ENCOUNTER — Encounter: Payer: Self-pay | Admitting: Physical Medicine & Rehabilitation

## 2020-12-10 ENCOUNTER — Encounter
Payer: BC Managed Care – PPO | Attending: Physical Medicine & Rehabilitation | Admitting: Physical Medicine & Rehabilitation

## 2020-12-10 ENCOUNTER — Other Ambulatory Visit: Payer: Self-pay

## 2020-12-10 VITALS — BP 126/83 | HR 73 | Temp 98.2°F | Ht 71.0 in | Wt 238.0 lb

## 2020-12-10 DIAGNOSIS — G894 Chronic pain syndrome: Secondary | ICD-10-CM | POA: Insufficient documentation

## 2020-12-10 DIAGNOSIS — G8110 Spastic hemiplegia affecting unspecified side: Secondary | ICD-10-CM | POA: Insufficient documentation

## 2020-12-10 DIAGNOSIS — I63511 Cerebral infarction due to unspecified occlusion or stenosis of right middle cerebral artery: Secondary | ICD-10-CM | POA: Insufficient documentation

## 2020-12-10 MED ORDER — OXYCODONE HCL 5 MG PO TABS
5.0000 mg | ORAL_TABLET | ORAL | 0 refills | Status: DC | PRN
Start: 2020-12-10 — End: 2021-05-13

## 2020-12-10 MED ORDER — LIDOCAINE 5 % EX PTCH: 1.0000 | MEDICATED_PATCH | CUTANEOUS | 5 refills | Status: DC

## 2020-12-10 MED ORDER — TIZANIDINE HCL 4 MG PO TABS: 4.0000 mg | ORAL_TABLET | Freq: Three times a day (TID) | ORAL | 3 refills | Status: DC

## 2020-12-10 NOTE — Patient Instructions (Signed)
TIZANIDINE: IF YOU'RE ONLY TAKING IT TWICE DAILY, THEN INCREASE TO THREE X DAILY FOR 5 DAYS  THEN DO THE FOLLOWING: 2MG -2MG -4MG  FOR 5 DAYS,THEN 4MG -2MG -4MG  FOR 5 MORE DAYS, THEN 4MG  THEE X DAILY THEREAFTER

## 2020-12-10 NOTE — Progress Notes (Signed)
Subjective:    Patient ID: [name], male    DOB: Jul 17, 1976, 45 y.o.   MRN: [id number]  HPI Mr. Lemmerman is here in follow up of his right MCA infarct. In December. He last had therapy in December due to no shows/no calls. He had good results with the December botox  Injections to his left quads and gastrocs.  He is doing some stretching on his own at home.  Family helps out sometimes.  He wants to get back in the outpatient therapy.  Apparently he saw his primary physician who prescribed home health. He remains on baclofen and tizanidine for muscle spasms.  He is not sure if he is taking the tizanidine 3 times a day as prescribed.  He is out of his oxycodone which he takes for his shoulder pain and needs his lidocaine patch prescription refilled today.  He was infected with Covid last month.  He avoid any significant sequela I from the illness fortunately.  He denies any new health issues.   Pain Inventory Average Pain 5 Pain Right Now 5 My pain is intermittent and aching  In the last 24 hours, has pain interfered with the following? General activity 8 Relation with others 8 Enjoyment of life 8 What TIME of day is your pain at its worst? varies Sleep (in general) Poor  Pain is worse with: unsure Pain improves with: medication Relief from Meds: 8  Family History  Problem Relation Age of Onset  . Stroke Neg Hx   . CAD Neg Hx    Social History   Socioeconomic History  . Marital status: Married    Spouse name: Not on file  . Number of children: Not on file  . Years of education: Not on file  . Highest education level: Not on file  Occupational History  . Occupation: truck Hospital doctor  Tobacco Use  . Smoking status: Current Every Day Smoker    Packs/day: 0.20    Types: Cigarettes  . Smokeless tobacco: Never Used  Vaping Use  . Vaping Use: Never used  Substance and Sexual Activity  . Alcohol use: Yes    Comment: 2-3 times a week.   . Drug use: Yes    Types: Marijuana   . Sexual activity: Yes    Birth control/protection: Condom  Other Topics Concern  . Not on file  Social History Narrative  . Not on file   Social Determinants of Health   Financial Resource Strain: Not on file  Food Insecurity: Not on file  Transportation Needs: Not on file  Physical Activity: Not on file  Stress: Not on file  Social Connections: Not on file   Past Surgical History:  Procedure Laterality Date  . BUBBLE STUDY  01/10/2020   Procedure: BUBBLE STUDY;  Surgeon: Sande Rives, MD;  Location: [facility] ENDOSCOPY;  Service: Cardiovascular;;  . TEE WITHOUT CARDIOVERSION N/A 01/10/2020   Procedure: TRANSESOPHAGEAL ECHOCARDIOGRAM (TEE);  Surgeon: Sande Rives, MD;  Location: [facility] ENDOSCOPY;  Service: Cardiovascular;  Laterality: N/A;   Past Surgical History:  Procedure Laterality Date  . BUBBLE STUDY  01/10/2020   Procedure: BUBBLE STUDY;  Surgeon: Sande Rives, MD;  Location: [facility] ENDOSCOPY;  Service: Cardiovascular;;  . TEE WITHOUT CARDIOVERSION N/A 01/10/2020   Procedure: TRANSESOPHAGEAL ECHOCARDIOGRAM (TEE);  Surgeon: Sande Rives, MD;  Location: [facility] ENDOSCOPY;  Service: Cardiovascular;  Laterality: N/A;   Past Medical History:  Diagnosis Date  . 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  Alcohol dependence (HCC)   . Back pain   . Marijuana dependence (HCC)   . Tobacco dependence    BP 126/83   Pulse 73   Temp 98.2 F (36.8 C)   Ht 5\' 11"  (1.803 m)   Wt 238 lb (108 kg)   SpO2 94%   BMI 33.19 kg/m   Opioid Risk Score:   Fall Risk Score:  `1  Depression screen PHQ 2/9  Depression screen [facility] 2/9 06/18/2020 02/27/2020  Decreased Interest 0 0  Down, Depressed, Hopeless 0 0  PHQ - 2 Score 0 0  Altered sleeping - 0  Tired, decreased energy - 1  Change in appetite - 0  Feeling bad or failure about yourself  - 0  Trouble concentrating - 0  Moving slowly or fidgety/restless - 0  Suicidal thoughts - 0  PHQ-9 Score - 1   Difficult doing work/chores - Somewhat difficult    Review of Systems  Constitutional: Negative.   HENT: Negative.   Eyes: Negative.   Respiratory: Negative.   Cardiovascular: Negative.   Gastrointestinal: Negative.   Endocrine: Negative.   Genitourinary: Negative.   Musculoskeletal: Positive for arthralgias and gait problem.  Skin: Negative.   Allergic/Immunologic: Negative.   Hematological: Negative.   Psychiatric/Behavioral: Negative.  Decreased concentration: 6.  All other systems reviewed and are negative.      Objective:   Physical Exam General: No acute distress HEENT: EOMI, oral membranes moist Cards: reg rate  Chest: normal effort Abdomen: Soft, NT, ND Skin: dry, intact Extremities: no edema Psych: pleasant and appropriate Neuro: Left upper extremity remains 0-1 out of 5 with more proximal movement and distal.  Left lower extremity is 3 out of 5 hip flexion 3- out of 5 knee extension 0 to trace at the ankle.  decreased LT LUE and LLE  Left upper extremity notable for tone at the pectoralis major and minor as well as the biceps and wrist flexors.  Tone ranges from 1- 1+/4LUE. 02/29/2020  left quads and gastrocs 1-2/4. DTR's 3+.  Muscl: left shoulder remains somewhat painful with PROM in all planes with sl sublux. decreased ROM           Assessment & Plan:  1.Right Middle Cerebral Artery Stroke:             -HH per primary 2. Spastic Hemiparesis: Continue Baclofen   -increase tizanidine to 4mg  tid after titration             -arrange botox 400 u to left pecs finger flexors             -maintaine HEP. 3.History of Hypertension: PCP Following. Continue to Monitor.  4.Dyslipidemia: Lipitor refilled 5.left shoulder pain consistent with hemiplegic shoulder.  Pain is both in the capsule itself as well as related to spasticity and neuropathic pain.               -CONTINUE Lyrica 50 mg twice daily titrating up to 3 times daily              -Reviewed the importance of range of  motion and desensitization activities             -  home health physical therapy             --Refilled oxycodone and a #30 today             -We will continue the controlled substance monitoring program, this consists of regular clinic visits, examinations, routine  drug screening, pill counts as well as use of [place] Controlled Substance Reporting System. [place] today.      15 minutes of face to face patient care time were spent during this visit. All questions were encouraged and answered. Follow up with me in 6 weeks for botox.

## 2020-12-11 DIAGNOSIS — I6529 Occlusion and stenosis of unspecified carotid artery: Secondary | ICD-10-CM | POA: Diagnosis not present

## 2020-12-11 DIAGNOSIS — Z9181 History of falling: Secondary | ICD-10-CM | POA: Diagnosis not present

## 2020-12-11 DIAGNOSIS — E7800 Pure hypercholesterolemia, unspecified: Secondary | ICD-10-CM | POA: Diagnosis not present

## 2020-12-11 DIAGNOSIS — I10 Essential (primary) hypertension: Secondary | ICD-10-CM | POA: Diagnosis not present

## 2020-12-11 DIAGNOSIS — Z87891 Personal history of nicotine dependence: Secondary | ICD-10-CM | POA: Diagnosis not present

## 2020-12-11 DIAGNOSIS — Z7982 Long term (current) use of aspirin: Secondary | ICD-10-CM | POA: Diagnosis not present

## 2020-12-11 DIAGNOSIS — I69354 Hemiplegia and hemiparesis following cerebral infarction affecting left non-dominant side: Secondary | ICD-10-CM | POA: Diagnosis not present

## 2020-12-11 DIAGNOSIS — D509 Iron deficiency anemia, unspecified: Secondary | ICD-10-CM | POA: Diagnosis not present

## 2020-12-12 DIAGNOSIS — Z9181 History of falling: Secondary | ICD-10-CM | POA: Diagnosis not present

## 2020-12-12 DIAGNOSIS — Z87891 Personal history of nicotine dependence: Secondary | ICD-10-CM | POA: Diagnosis not present

## 2020-12-12 DIAGNOSIS — I69354 Hemiplegia and hemiparesis following cerebral infarction affecting left non-dominant side: Secondary | ICD-10-CM | POA: Diagnosis not present

## 2020-12-12 DIAGNOSIS — Z7982 Long term (current) use of aspirin: Secondary | ICD-10-CM | POA: Diagnosis not present

## 2020-12-12 DIAGNOSIS — I10 Essential (primary) hypertension: Secondary | ICD-10-CM | POA: Diagnosis not present

## 2020-12-12 DIAGNOSIS — I6529 Occlusion and stenosis of unspecified carotid artery: Secondary | ICD-10-CM | POA: Diagnosis not present

## 2020-12-12 DIAGNOSIS — E7800 Pure hypercholesterolemia, unspecified: Secondary | ICD-10-CM | POA: Diagnosis not present

## 2020-12-12 DIAGNOSIS — D509 Iron deficiency anemia, unspecified: Secondary | ICD-10-CM | POA: Diagnosis not present

## 2020-12-15 ENCOUNTER — Other Ambulatory Visit: Payer: Self-pay | Admitting: Physical Medicine & Rehabilitation

## 2020-12-15 DIAGNOSIS — I63511 Cerebral infarction due to unspecified occlusion or stenosis of right middle cerebral artery: Secondary | ICD-10-CM

## 2020-12-16 DIAGNOSIS — E7800 Pure hypercholesterolemia, unspecified: Secondary | ICD-10-CM | POA: Diagnosis not present

## 2020-12-16 DIAGNOSIS — I10 Essential (primary) hypertension: Secondary | ICD-10-CM | POA: Diagnosis not present

## 2020-12-16 DIAGNOSIS — Z87891 Personal history of nicotine dependence: Secondary | ICD-10-CM | POA: Diagnosis not present

## 2020-12-16 DIAGNOSIS — D509 Iron deficiency anemia, unspecified: Secondary | ICD-10-CM | POA: Diagnosis not present

## 2020-12-16 DIAGNOSIS — I69354 Hemiplegia and hemiparesis following cerebral infarction affecting left non-dominant side: Secondary | ICD-10-CM | POA: Diagnosis not present

## 2020-12-16 DIAGNOSIS — Z9181 History of falling: Secondary | ICD-10-CM | POA: Diagnosis not present

## 2020-12-16 DIAGNOSIS — I6529 Occlusion and stenosis of unspecified carotid artery: Secondary | ICD-10-CM | POA: Diagnosis not present

## 2020-12-16 DIAGNOSIS — Z7982 Long term (current) use of aspirin: Secondary | ICD-10-CM | POA: Diagnosis not present

## 2020-12-17 DIAGNOSIS — I69354 Hemiplegia and hemiparesis following cerebral infarction affecting left non-dominant side: Secondary | ICD-10-CM | POA: Diagnosis not present

## 2020-12-17 DIAGNOSIS — Z7982 Long term (current) use of aspirin: Secondary | ICD-10-CM | POA: Diagnosis not present

## 2020-12-17 DIAGNOSIS — I6529 Occlusion and stenosis of unspecified carotid artery: Secondary | ICD-10-CM | POA: Diagnosis not present

## 2020-12-17 DIAGNOSIS — D509 Iron deficiency anemia, unspecified: Secondary | ICD-10-CM | POA: Diagnosis not present

## 2020-12-17 DIAGNOSIS — Z87891 Personal history of nicotine dependence: Secondary | ICD-10-CM | POA: Diagnosis not present

## 2020-12-17 DIAGNOSIS — Z9181 History of falling: Secondary | ICD-10-CM | POA: Diagnosis not present

## 2020-12-17 DIAGNOSIS — I10 Essential (primary) hypertension: Secondary | ICD-10-CM | POA: Diagnosis not present

## 2020-12-17 DIAGNOSIS — E7800 Pure hypercholesterolemia, unspecified: Secondary | ICD-10-CM | POA: Diagnosis not present

## 2020-12-19 DIAGNOSIS — I6529 Occlusion and stenosis of unspecified carotid artery: Secondary | ICD-10-CM | POA: Diagnosis not present

## 2020-12-19 DIAGNOSIS — Z87891 Personal history of nicotine dependence: Secondary | ICD-10-CM | POA: Diagnosis not present

## 2020-12-19 DIAGNOSIS — Z9181 History of falling: Secondary | ICD-10-CM | POA: Diagnosis not present

## 2020-12-19 DIAGNOSIS — I69354 Hemiplegia and hemiparesis following cerebral infarction affecting left non-dominant side: Secondary | ICD-10-CM | POA: Diagnosis not present

## 2020-12-19 DIAGNOSIS — D509 Iron deficiency anemia, unspecified: Secondary | ICD-10-CM | POA: Diagnosis not present

## 2020-12-19 DIAGNOSIS — Z7982 Long term (current) use of aspirin: Secondary | ICD-10-CM | POA: Diagnosis not present

## 2020-12-19 DIAGNOSIS — E7800 Pure hypercholesterolemia, unspecified: Secondary | ICD-10-CM | POA: Diagnosis not present

## 2020-12-19 DIAGNOSIS — I10 Essential (primary) hypertension: Secondary | ICD-10-CM | POA: Diagnosis not present

## 2020-12-23 DIAGNOSIS — I69354 Hemiplegia and hemiparesis following cerebral infarction affecting left non-dominant side: Secondary | ICD-10-CM | POA: Diagnosis not present

## 2020-12-23 DIAGNOSIS — Z7982 Long term (current) use of aspirin: Secondary | ICD-10-CM | POA: Diagnosis not present

## 2020-12-23 DIAGNOSIS — Z9181 History of falling: Secondary | ICD-10-CM | POA: Diagnosis not present

## 2020-12-23 DIAGNOSIS — I6529 Occlusion and stenosis of unspecified carotid artery: Secondary | ICD-10-CM | POA: Diagnosis not present

## 2020-12-23 DIAGNOSIS — D509 Iron deficiency anemia, unspecified: Secondary | ICD-10-CM | POA: Diagnosis not present

## 2020-12-23 DIAGNOSIS — Z87891 Personal history of nicotine dependence: Secondary | ICD-10-CM | POA: Diagnosis not present

## 2020-12-23 DIAGNOSIS — E7800 Pure hypercholesterolemia, unspecified: Secondary | ICD-10-CM | POA: Diagnosis not present

## 2020-12-23 DIAGNOSIS — I10 Essential (primary) hypertension: Secondary | ICD-10-CM | POA: Diagnosis not present

## 2020-12-25 DIAGNOSIS — E7800 Pure hypercholesterolemia, unspecified: Secondary | ICD-10-CM | POA: Diagnosis not present

## 2020-12-25 DIAGNOSIS — I6529 Occlusion and stenosis of unspecified carotid artery: Secondary | ICD-10-CM | POA: Diagnosis not present

## 2020-12-25 DIAGNOSIS — Z9181 History of falling: Secondary | ICD-10-CM | POA: Diagnosis not present

## 2020-12-25 DIAGNOSIS — I69354 Hemiplegia and hemiparesis following cerebral infarction affecting left non-dominant side: Secondary | ICD-10-CM | POA: Diagnosis not present

## 2020-12-25 DIAGNOSIS — Z87891 Personal history of nicotine dependence: Secondary | ICD-10-CM | POA: Diagnosis not present

## 2020-12-25 DIAGNOSIS — I10 Essential (primary) hypertension: Secondary | ICD-10-CM | POA: Diagnosis not present

## 2020-12-25 DIAGNOSIS — Z7982 Long term (current) use of aspirin: Secondary | ICD-10-CM | POA: Diagnosis not present

## 2020-12-25 DIAGNOSIS — D509 Iron deficiency anemia, unspecified: Secondary | ICD-10-CM | POA: Diagnosis not present

## 2020-12-30 DIAGNOSIS — Z7982 Long term (current) use of aspirin: Secondary | ICD-10-CM | POA: Diagnosis not present

## 2020-12-30 DIAGNOSIS — D509 Iron deficiency anemia, unspecified: Secondary | ICD-10-CM | POA: Diagnosis not present

## 2020-12-30 DIAGNOSIS — Z9181 History of falling: Secondary | ICD-10-CM | POA: Diagnosis not present

## 2020-12-30 DIAGNOSIS — E7800 Pure hypercholesterolemia, unspecified: Secondary | ICD-10-CM | POA: Diagnosis not present

## 2020-12-30 DIAGNOSIS — I6529 Occlusion and stenosis of unspecified carotid artery: Secondary | ICD-10-CM | POA: Diagnosis not present

## 2020-12-30 DIAGNOSIS — I69354 Hemiplegia and hemiparesis following cerebral infarction affecting left non-dominant side: Secondary | ICD-10-CM | POA: Diagnosis not present

## 2020-12-30 DIAGNOSIS — Z87891 Personal history of nicotine dependence: Secondary | ICD-10-CM | POA: Diagnosis not present

## 2020-12-30 DIAGNOSIS — I10 Essential (primary) hypertension: Secondary | ICD-10-CM | POA: Diagnosis not present

## 2021-01-01 ENCOUNTER — Other Ambulatory Visit: Payer: Self-pay | Admitting: Physical Medicine & Rehabilitation

## 2021-01-01 DIAGNOSIS — I10 Essential (primary) hypertension: Secondary | ICD-10-CM | POA: Diagnosis not present

## 2021-01-01 DIAGNOSIS — I63511 Cerebral infarction due to unspecified occlusion or stenosis of right middle cerebral artery: Secondary | ICD-10-CM

## 2021-01-01 DIAGNOSIS — Z87891 Personal history of nicotine dependence: Secondary | ICD-10-CM | POA: Diagnosis not present

## 2021-01-01 DIAGNOSIS — D509 Iron deficiency anemia, unspecified: Secondary | ICD-10-CM | POA: Diagnosis not present

## 2021-01-01 DIAGNOSIS — I6529 Occlusion and stenosis of unspecified carotid artery: Secondary | ICD-10-CM | POA: Diagnosis not present

## 2021-01-01 DIAGNOSIS — Z7982 Long term (current) use of aspirin: Secondary | ICD-10-CM | POA: Diagnosis not present

## 2021-01-01 DIAGNOSIS — Z9181 History of falling: Secondary | ICD-10-CM | POA: Diagnosis not present

## 2021-01-01 DIAGNOSIS — I69354 Hemiplegia and hemiparesis following cerebral infarction affecting left non-dominant side: Secondary | ICD-10-CM | POA: Diagnosis not present

## 2021-01-01 DIAGNOSIS — E7800 Pure hypercholesterolemia, unspecified: Secondary | ICD-10-CM | POA: Diagnosis not present

## 2021-01-13 ENCOUNTER — Other Ambulatory Visit: Payer: Self-pay

## 2021-01-13 ENCOUNTER — Encounter: Payer: Self-pay | Admitting: Occupational Therapy

## 2021-01-13 ENCOUNTER — Ambulatory Visit: Payer: BC Managed Care – PPO | Attending: Family Medicine | Admitting: Occupational Therapy

## 2021-01-13 ENCOUNTER — Ambulatory Visit: Payer: BC Managed Care – PPO

## 2021-01-13 DIAGNOSIS — R41840 Attention and concentration deficit: Secondary | ICD-10-CM | POA: Diagnosis not present

## 2021-01-13 DIAGNOSIS — I69354 Hemiplegia and hemiparesis following cerebral infarction affecting left non-dominant side: Secondary | ICD-10-CM | POA: Insufficient documentation

## 2021-01-13 DIAGNOSIS — R208 Other disturbances of skin sensation: Secondary | ICD-10-CM | POA: Diagnosis not present

## 2021-01-13 DIAGNOSIS — G8110 Spastic hemiplegia affecting unspecified side: Secondary | ICD-10-CM | POA: Diagnosis not present

## 2021-01-13 DIAGNOSIS — R293 Abnormal posture: Secondary | ICD-10-CM | POA: Insufficient documentation

## 2021-01-13 DIAGNOSIS — M6281 Muscle weakness (generalized): Secondary | ICD-10-CM

## 2021-01-13 DIAGNOSIS — R2689 Other abnormalities of gait and mobility: Secondary | ICD-10-CM | POA: Diagnosis not present

## 2021-01-13 DIAGNOSIS — M79602 Pain in left arm: Secondary | ICD-10-CM | POA: Insufficient documentation

## 2021-01-13 DIAGNOSIS — R2681 Unsteadiness on feet: Secondary | ICD-10-CM

## 2021-01-13 NOTE — Therapy (Signed)
[facility] [address] [place], [place], [address] Phone: [phone]   Fax:  [phone]  Occupational Therapy Evaluation  Patient Details  Name: Derek [name] MRN: [id number] Date of Birth: 1976-03-12 Referring Provider (OT): Aliene Beams   Encounter Date: 01/13/2021   OT End of Session - 01/13/21 1518    Visit Number 1    Number of Visits 13    Date for OT Re-Evaluation 03/29/21    Authorization Type [employer] and [facility] Medicaid    Authorization Time Period awaiting auth    OT Start Time 1400    OT Stop Time 1445    OT Time Calculation (min) 45 min    Activity Tolerance Other (comment)    Behavior During Therapy Flat affect           Past Medical History:  Diagnosis Date  . 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 Alcohol dependence (HCC)   . Back pain   . Marijuana dependence (HCC)   . Tobacco dependence     Past Surgical History:  Procedure Laterality Date  . BUBBLE STUDY  01/10/2020   Procedure: BUBBLE STUDY;  Surgeon: Sande Rives, MD;  Location: [facility] ENDOSCOPY;  Service: Cardiovascular;;  . TEE WITHOUT CARDIOVERSION N/A 01/10/2020   Procedure: TRANSESOPHAGEAL ECHOCARDIOGRAM (TEE);  Surgeon: Sande Rives, MD;  Location: [facility] ENDOSCOPY;  Service: Cardiovascular;  Laterality: N/A;    There were no vitals filed for this visit.   Subjective Assessment - 01/13/21 1410    Subjective  Get my arm and leg to move better    Currently in Pain? No/denies             [facility] OT Assessment - 01/13/21 0001      Assessment   Medical Diagnosis Stroke - chronic    Referring Provider (OT) Tracie Harrier, Rachel    Onset Date/Surgical Date 12/31/20    Hand Dominance Right    Prior Therapy OP and HH therapy      Precautions   Precautions Fall      Balance Screen   Has the patient fallen in the past 6 months No    Has the patient had a decrease in activity level because of a fear of  falling?  Yes    Is the patient reluctant to leave their home because of a fear of falling?  No      Prior Function   Level of Independence Independent with basic ADLs    Vocation Full time employment    Vocation Requirements Drove a box truck    Leisure sports, play with kids, walk the dog      ADL   Eating/Feeding Minimal assistance    Grooming Minimal assistance    Upper Body Bathing Moderate assistance    Lower Body Bathing Maximal assistance    Upper Body Dressing Maximal assistance    Lower Body Dressing Maximal assistance    Toilet Transfer Modified independent    Toileting - Clothing Manipulation Modified independent    Toileting -  Hygiene Modified Independent    Tub/Shower Transfer Minimal assistance      Written Expression   Dominant Hand Right      Vision Assessment   Eye Alignment Within Functional Limits    Ocular Range of Motion Restricted on left    Tracking/Visual Pursuits Decreased smoothness of horizontal tracking    Saccades Additional eye shifts occurred during testing      Cognition  Overall Cognitive Status Impaired/Different from baseline    Area of Impairment Attention;Memory;Awareness;Problem solving    Current Attention Level Sustained    Memory Decreased short-term memory    Awareness Intellectual    Problem Solving Slow processing;Decreased initiation;Difficulty sequencing      Observation/Other Assessments   Focus on Therapeutic Outcomes (FOTO)  NA- Chronic      Posture/Postural Control   Posture/Postural Control Postural limitations    Postural Limitations Forward head;Posterior pelvic tilt      Sensation   Light Touch Impaired by gross assessment    Stereognosis Not tested    Hot/Cold Not tested    Proprioception Impaired by gross assessment    Additional Comments Hypersensitive to cold in LUE      Coordination   Gross Motor Movements are Fluid and Coordinated No    Fine Motor Movements are Fluid and Coordinated No     Coordination No distal volitional movement noted      Perception   Inattention/Neglect Does not attend to left side of body    Spatial Orientation poor midline awareness      ROM / Strength   AROM / PROM / Strength AROM      AROM   Overall AROM  Deficits    Overall AROM Comments Minimal 5-10* of motion in Left shoulder - flex/ext, elbow flex/ext                           OT Education - 01/13/21 1517    Education Details potential goals - basic self care skills, reduce pain, and improve balance    Person(s) Educated Patient    Methods Explanation    Comprehension Need further instruction            OT Short Term Goals - 01/13/21 1537      OT SHORT TERM GOAL #1   Title Patient will complete a self stretching program for left upper extremity    Baseline not currently performing consistently    Time 4    Period Weeks    Status New    Target Date 02/27/21      OT SHORT TERM GOAL #2   Title Patient will dress upper body, pull over short sleeve tshirt with increased time and min assist    Baseline mod- total assist    Time 4    Period Weeks      OT SHORT TERM GOAL #3   Title Patient will report pain of less than 4/10 with forward low reaching tasks    Baseline Pain ranges toward 7-8/10    Time 4    Period Weeks    Status New      OT SHORT TERM GOAL #4   Title Pt to bathe self with min assist seated    Baseline Patient  actively participates in bathing with max assist    Time 4    Period Weeks    Status New      OT SHORT TERM GOAL #5   Title Pt to don/doff a front opening shirt or jacket with mod assist    Baseline max assist    Time 4    Period Weeks    Status New             OT Long Term Goals - 01/13/21 1543      OT LONG TERM GOAL #1   Title Patient will complete an updated HEP to address movement  in LUE    Baseline Inconsistent performance of HEP    Time 6    Period Weeks    Status New    Target Date 03/29/21      OT LONG TERM  GOAL #2   Title Patient will transfer into and out of shower with supervision    Baseline min assist    Time 6    Period Weeks    Status New      OT LONG TERM GOAL #3   Title Pt to verbalize understanding with potential A/E needs and task modifications to increase ease with ADLS and simple IADLS    Baseline Unable to verbalize    Time 6    Period Weeks    Status New      OT LONG TERM GOAL #4   Title Pt to be mod I level with bathing and UE dressing and min assist for LE dressing    Baseline mod-dependence    Time 6    Status New      OT LONG TERM GOAL #5   Title xxx                 Plan - 01/13/21 1519    Clinical Impression Statement Patient is a 45 yr old male referred for OT following stroke with left hemiplegia, shoulder pain, decreased balance, decreased attention, awareness, insight and memory, and dependence with basic self care skills.  Patient will benefit from skilled OT services to increase independence with ADL and to reduce pain and improve balance overall to reduce caregiver burden.    OT Occupational Profile and History Detailed Assessment- Review of Records and additional review of physical, cognitive, psychosocial history related to current functional performance    Occupational performance deficits (Please refer to evaluation for details): ADL's;IADL's;Rest and Sleep    Body Structure / Function / Physical Skills ADL;Coordination;Endurance;GMC;Muscle spasms;UE functional use;Balance;Decreased knowledge of precautions;Sensation;Vision;Pain;IADL;Flexibility;Decreased knowledge of use of DME;Body mechanics;Dexterity;FMC;Proprioception;Strength;Tone;ROM;Mobility;Edema    Cognitive Skills Attention;Emotional;Energy/Drive;Learn;Memory;Sequencing;Safety Awareness;Problem Solve;Perception    Rehab Potential Fair    Clinical Decision Making Several treatment options, min-mod task modification necessary    Comorbidities Affecting Occupational Performance: May have  comorbidities impacting occupational performance    Modification or Assistance to Complete Evaluation  Min-Moderate modification of tasks or assist with assess necessary to complete eval    OT Frequency 2x / week    OT Duration 6 weeks    OT Treatment/Interventions Self-care/ADL training;Aquatic Therapy;Electrical Stimulation;Therapeutic exercise;Visual/perceptual remediation/compensation;Moist Heat;Neuromuscular education;Splinting;Patient/family education;Balance training;Therapeutic activities;Functional Mobility Training;Fluidtherapy;Ultrasound;Cryotherapy;Contrast Bath;DME and/or AE instruction;Manual Therapy;Passive range of motion;Cognitive remediation/compensation    Plan Begin upper body dressing skills.    OT Home Exercise Plan review prior exercises    Consulted and Agree with Plan of Care Patient           Patient will benefit from skilled therapeutic intervention in order to improve the following deficits and impairments:   Body Structure / Function / Physical Skills: ADL,Coordination,Endurance,GMC,Muscle spasms,UE functional use,Balance,Decreased knowledge of precautions,Sensation,Vision,Pain,IADL,Flexibility,Decreased knowledge of use of DME,Body mechanics,Dexterity,FMC,Proprioception,Strength,Tone,ROM,Mobility,Edema Cognitive Skills: Attention,Emotional,Energy/Drive,Learn,Memory,Sequencing,Safety Awareness,Problem Solve,Perception     Visit Diagnosis: 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 Neuropathic pain 04/09/2020  . Pain   . Spastic hemiparesis (HCC)   . History of hypertension   . Dyslipidemia   . 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 Leukocytosis 01/12/2020  . Cerebrovascular accident (CVA) due to thrombosis of right middle cerebral artery (HCC)   . Chronic pain syndrome   . Hypokalemia   . Dysphagia, post-stroke   . 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 Tobacco dependence   . Marijuana dependence (HCC)   . Alcohol dependence (HCC)     Collier Salina, OTR/L 01/13/2021, 3:57 PM  [facility] [address] [place], [place], [address] Phone: [phone]   Fax:  [phone]  Name: Derek [name] MRN: [id number] Date of Birth: November 24, 1975

## 2021-01-14 NOTE — Therapy (Signed)
[facility] [address] [place], [place], [address] Phone: [phone]   Fax:  [phone]  Physical Therapy Evaluation  Patient Details  Name: Derek [name] MRN: [id number] Date of Birth: 16-Sep-1976 Referring Provider (PT): Aliene Beams   Encounter Date: 01/13/2021   PT End of Session - 01/13/21 1452    Visit Number 1    Number of Visits 17    Authorization Type BCBS with medicaid secondary    PT Start Time 1448    PT Stop Time 1530    PT Time Calculation (min) 42 min    Equipment Utilized During Treatment Gait belt    Activity Tolerance Patient tolerated treatment well    Behavior During Therapy Flat affect           Past Medical History:  Diagnosis Date  . 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 Alcohol dependence (HCC)   . Back pain   . Marijuana dependence (HCC)   . Tobacco dependence     Past Surgical History:  Procedure Laterality Date  . BUBBLE STUDY  01/10/2020   Procedure: BUBBLE STUDY;  Surgeon: Sande Rives, MD;  Location: [facility] ENDOSCOPY;  Service: Cardiovascular;;  . TEE WITHOUT CARDIOVERSION N/A 01/10/2020   Procedure: TRANSESOPHAGEAL ECHOCARDIOGRAM (TEE);  Surgeon: Sande Rives, MD;  Location: [facility] ENDOSCOPY;  Service: Cardiovascular;  Laterality: N/A;    There were no vitals filed for this visit.    Subjective Assessment - 01/13/21 1453    Subjective Pt returns to PT as he wants to continue to work on left arm and leg. Pt reports that he walks with [facility] [address] He also has a w/c that he brings when he goes out in case he needs it. Denies any recent falls. Pt reports that he did have one time when was leaning forward on couch and could not get back up. A friend helped him up.    Patient Stated Goals Pt would like to work on improving left leg strength, improve bend in it, to make it easier to walk.    Currently in Pain? Yes    Pain Score 0-No pain    Pain Location  Shoulder   and hip   Pain Orientation Left    Pain Descriptors / Indicators Aching    Pain Type Chronic pain    Pain Onset More than a month ago    Pain Frequency Intermittent    Aggravating Factors  prolonged sitting without trunk support or arm support    Pain Relieving Factors having support and pain meds              [facility] PT Assessment - 01/13/21 1458      Assessment   Medical Diagnosis Hemiplegia & hemiparesis - h/o CVA    Referring Provider (PT) Aliene Beams    Onset Date/Surgical Date 12/30/20   CVA 01/07/20   Hand Dominance Right    Prior Therapy pt had outpatient PT and OT last fall      Precautions   Precautions Fall    Required Braces or Orthoses Other Brace/Splint    Other Brace/Splint let shoulder support      Balance Screen   Has the patient fallen in the past 6 months No    Has the patient had a decrease in activity level because of a fear of falling?  Yes    Is the patient reluctant to leave their home because of a fear of falling?  Yes  Home Environment   Living Environment Private residence    Living Arrangements Spouse/significant other    Available Help at Discharge Family    Type of Home House    Home Access Stairs to enter    Entrance Stairs-Number of Steps 8    Entrance Stairs-Rails Right;Left;Can reach both    Home Layout Multi-level   has basement but doesn't go down there.   Home Equipment Cane - quad;Bedside commode;Tub bench;Wheelchair - manual    Additional Comments pt reports that ramp was taken away a few days ago. They are trying to get a new one.      Prior Function   Level of Independence Needs assistance with ADLs;Independent with household mobility with device   prior to stroke was independent   Vocation On disability    Leisure used to play basketball and play with dog      Cognition   Overall Cognitive Status Impaired/Different from baseline      Sensation   Light Touch Impaired by gross assessment    Additional Comments  pt has impaired sensation to light touch on left compared to right more noticeable decrease in left arm      ROM / Strength   AROM / PROM / Strength Strength      Strength   Overall Strength Comments Passive shoulder flexion only to about 60 degrees.    Strength Assessment Site Shoulder;Elbow;Hand;Hip;Knee;Ankle    Right/Left Shoulder Right;Left    Right Shoulder Flexion 5/5    Left Shoulder Flexion 2-/5    Right/Left Elbow Right;Left    Right Elbow Flexion 5/5    Right Elbow Extension 5/5    Left Elbow Flexion 2-/5    Left Elbow Extension 1/5    Right/Left hand Right;Left    Right Hand Gross Grasp Functional    Left Hand Gross Grasp Impaired   no active grasp, did have some flexion noted with pressure in palm   Right/Left Hip Right;Left    Right Hip Flexion 4/5    Left Hip Flexion 2-/5    Right/Left Knee Right;Left    Right Knee Flexion 5/5    Right Knee Extension 5/5    Left Knee Flexion 2-/5    Left Knee Extension 3+/5    Right/Left Ankle Right;Left    Right Ankle Dorsiflexion 5/5    Right Ankle Plantar Flexion 5/5    Left Ankle Dorsiflexion 1/5    Left Ankle Plantar Flexion 1/5      Transfers   Transfers Sit to Stand;Stand to Sit    Sit to Stand 5: Supervision    Sit to Stand Details (indicate cue type and reason) Pt has most weight shifted to right. Has to help left foot back prior to rising with arms    Five time sit to stand comments  39.79 sec from chair with right arm.    Stand to Sit 5: Supervision      Ambulation/Gait   Ambulation/Gait Yes    Ambulation/Gait Assistance 5: Supervision;4: Min guard    Ambulation Distance (Feet) 200 Feet    Assistive device Large base quad cane    Gait Pattern Step-through pattern;Decreased stance time - left;Decreased step length - right;Decreased hip/knee flexion - left;Decreased dorsiflexion - left;Lateral trunk lean to right    Gait velocity 31.55 sec=0.1m/s    Gait velocity - backwards .    Stairs Yes    Stairs  Assistance 4: Min guard    Stair Management Technique One rail Right;Step to  pattern    Number of Stairs 4    Height of Stairs 6      Standardized Balance Assessment   Standardized Balance Assessment Timed Up and Go Test      Timed Up and Go Test   TUG Normal TUG    Normal TUG (seconds) 31.28                      Objective measurements completed on examination: See above findings.               PT Education - 01/14/21 1133    Education Details Discussed PT plan of care    Person(s) Educated Patient    Methods Explanation    Comprehension Verbalized understanding            PT Short Term Goals - 01/14/21 1142      PT SHORT TERM GOAL #1   Title Pt will be independent with HEP for strengthening, flexibility and balance.    Baseline Pt reports he is walking around home currently.    Time 4    Period Weeks    Status New    Target Date 02/13/21      PT SHORT TERM GOAL #2   Title Pt will ambulate >300' on level surfaces with quad cane mod I with decreased right trunk lean for improved household and short community distances.    Baseline 200' supervision/CGA with quad cane    Time 4    Period Weeks    Status New    Target Date 02/13/21      PT SHORT TERM GOAL #3   Title Pt will decrease 5 x sit to stand from 39.79 sec to <35 sec for improved balance and functional strength.    Baseline 01/13/21 39.79 sec from w/c with hand    Time 4    Period Weeks    Status New    Target Date 02/13/21             PT Long Term Goals - 01/14/21 1159      PT LONG TERM GOAL #1   Title Pt will be able to walk >10 minutes in therapy for improved activity tolerance and mobility.    Baseline <5 min    Time 8    Period Weeks    Status New    Target Date 03/15/21      PT LONG TERM GOAL #2   Title Patient will demonstrate ability to ambulate > 600 ft w/ LRAD supervision on outdoor/indoor surfaces to demo improved community mobility    Baseline 200' on level  supervision/CGA    Time 8    Status New    Target Date 03/15/21      PT LONG TERM GOAL #3   Title Pt will decrease TUG from 31.28 sec to <35 sec for improved balance and decreased fall risk.    Baseline 01/13/21 31.28 sec    Time 8    Period Weeks    Status New    Target Date 03/15/21      PT LONG TERM GOAL #4   Title Pt will increase gait speed from 0.8m/s to >0.7m/s for improved household mobility.    Baseline 01/13/21 0.58m/s    Time 8    Period Weeks    Status New    Target Date 03/15/21      PT LONG TERM GOAL #5   Title Pt will be able to negotiate  up/down ramp and curb with quad cane CGA.    Baseline not performing    Time 8    Period Weeks    Status New    Target Date 03/15/21                  Plan - 01/13/21 1907    Clinical Impression Statement Pt is 45 y/o male wit history of CVA with left spastic hemiparesis form 01/07/2020. Pt's RLE hip and knee flexion strength is 2-/5 and right ankle DF and PF is 1/5. [facility] gait speed of 0.2527m/s indicating decreased safety with household mobility. Pt has decreased weight shift to left and uses trunk to compensate for decreased left foot clearance. Pt is fall risk based on 5 x sit to stand of 39.79 sec and TUG of 31.28 sec. Pt will benefit from skilled PT to address strength, flexibility, balance and functional mobility deficits.    Personal Factors and Comorbidities Comorbidity 3+;Time since onset of injury/illness/exacerbation    Comorbidities Right MCA CVA with Spastic Hemiparesis 01/07/20, alchohol dependence, back pain, marijuana dependence, tobacco dependence.    Examination-Activity Limitations Transfers;Stairs;Locomotion Level;Stand    Examination-Participation Restrictions Community Activity;Cleaning    Stability/Clinical Decision Making Evolving/Moderate complexity    Clinical Decision Making Moderate    Rehab Potential Good    PT Frequency 2x / week   plus eval   PT Duration 8  weeks    PT Treatment/Interventions ADLs/Self Care Home Management;DME Instruction;Therapeutic activities;Stair training;Gait training;Therapeutic exercise;Balance training;Neuromuscular re-education;Patient/family education;Orthotic Fit/Training;Manual techniques;Passive range of motion;Vestibular    PT Next Visit Plan Review prior HEP for from and update as needed- [id number]. Gait training. Work on weight shifting to left more with less right lateral trunk lean to clear leg. Does he need toe cap for new shoe. Has he been wearing his foot up brace?    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 Neuropathic pain 04/09/2020  . Pain   . Spastic hemiparesis (HCC)   . History of hypertension   . Dyslipidemia   . 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 Leukocytosis 01/12/2020  . Cerebrovascular accident (CVA) due to thrombosis of right middle cerebral artery (HCC)   . Chronic pain syndrome   . Hypokalemia   . Dysphagia, post-stroke   . 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 Tobacco dependence   . Marijuana dependence (HCC)   . Alcohol dependence (HCC)     Ronn MelenaEmily A Parker, PT, DPT, NCS 01/14/2021, 12:03 PM  [facility] [address] [place], [place],  [id number] Phone: [phone]   Fax:  [phone]  Name: Derek [name] MRN: [id number] Date of Birth: 12/31/1975

## 2021-01-15 ENCOUNTER — Ambulatory Visit: Payer: BC Managed Care – PPO

## 2021-01-15 ENCOUNTER — Other Ambulatory Visit: Payer: Self-pay

## 2021-01-15 DIAGNOSIS — R41840 Attention and concentration deficit: Secondary | ICD-10-CM | POA: Diagnosis not present

## 2021-01-15 DIAGNOSIS — R2681 Unsteadiness on feet: Secondary | ICD-10-CM

## 2021-01-15 DIAGNOSIS — G8110 Spastic hemiplegia affecting unspecified side: Secondary | ICD-10-CM | POA: Diagnosis not present

## 2021-01-15 DIAGNOSIS — R293 Abnormal posture: Secondary | ICD-10-CM

## 2021-01-15 DIAGNOSIS — M6281 Muscle weakness (generalized): Secondary | ICD-10-CM

## 2021-01-15 DIAGNOSIS — I69354 Hemiplegia and hemiparesis following cerebral infarction affecting left non-dominant side: Secondary | ICD-10-CM

## 2021-01-15 DIAGNOSIS — R208 Other disturbances of skin sensation: Secondary | ICD-10-CM | POA: Diagnosis not present

## 2021-01-15 DIAGNOSIS — R2689 Other abnormalities of gait and mobility: Secondary | ICD-10-CM

## 2021-01-15 DIAGNOSIS — M79602 Pain in left arm: Secondary | ICD-10-CM | POA: Diagnosis not present

## 2021-01-15 NOTE — Therapy (Signed)
[facility] [address] [place], [place], [address] Phone: [phone]   Fax:  [phone]  Occupational Therapy Evaluation  Patient Details  Name: Derek [name] MRN: [id number] Date of Birth: 15-May-1976 Referring Provider (OT): Aliene Beams   Encounter Date: 01/13/2021    Past Medical History:  Diagnosis Date  . 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 Alcohol dependence (HCC)   . Back pain   . Marijuana dependence (HCC)   . Tobacco dependence     Past Surgical History:  Procedure Laterality Date  . BUBBLE STUDY  01/10/2020   Procedure: BUBBLE STUDY;  Surgeon: Sande Rives, MD;  Location: [facility] ENDOSCOPY;  Service: Cardiovascular;;  . TEE WITHOUT CARDIOVERSION N/A 01/10/2020   Procedure: TRANSESOPHAGEAL ECHOCARDIOGRAM (TEE);  Surgeon: Sande Rives, MD;  Location: [facility] ENDOSCOPY;  Service: Cardiovascular;  Laterality: N/A;    There were no vitals filed for this visit.                         OT Short Term Goals - 01/13/21 1537      OT SHORT TERM GOAL #1   Title Patient will complete a self stretching program for left upper extremity    Baseline not currently performing consistently    Time 4    Period Weeks    Status New    Target Date 02/27/21      OT SHORT TERM GOAL #2   Title Patient will dress upper body, pull over short sleeve tshirt with increased time and min assist    Baseline mod- total assist    Time 4    Period Weeks      OT SHORT TERM GOAL #3   Title Patient will report pain of less than 4/10 with forward low reaching tasks    Baseline Pain ranges toward 7-8/10    Time 4    Period Weeks    Status New      OT SHORT TERM GOAL #4   Title Pt to bathe self with min assist seated    Baseline Patient  actively participates in bathing with max assist    Time 4    Period Weeks    Status New      OT SHORT TERM GOAL #5   Title Pt to don/doff  a front opening shirt or jacket with mod assist    Baseline max assist    Time 4    Period Weeks    Status New             OT Long Term Goals - 01/13/21 1543      OT LONG TERM GOAL #1   Title Patient will complete an updated HEP to address movement in LUE    Baseline Inconsistent performance of HEP    Time 6    Period Weeks    Status New    Target Date 03/29/21      OT LONG TERM GOAL #2   Title Patient will transfer into and out of shower with supervision    Baseline min assist    Time 6    Period Weeks    Status New      OT LONG TERM GOAL #3   Title Pt to verbalize understanding with potential A/E needs and task modifications to increase ease with ADLS and simple IADLS    Baseline Unable to verbalize    Time 6  Period Weeks    Status New      OT LONG TERM GOAL #4   Title Pt to be mod I level with bathing and UE dressing and min assist for LE dressing    Baseline mod-dependence    Time 6    Status New      OT LONG TERM GOAL #5   Title xxx                  Patient will benefit from skilled therapeutic intervention in order to improve the following deficits and impairments:   Body Structure / Function / Physical Skills: ADL,Coordination,Endurance,GMC,Muscle spasms,UE functional use,Balance,Decreased knowledge of precautions,Sensation,Vision,Pain,IADL,Flexibility,Decreased knowledge of use of DME,Body mechanics,Dexterity,FMC,Proprioception,Strength,Tone,ROM,Mobility,Edema Cognitive Skills: Attention,Emotional,Energy/Drive,Learn,Memory,Sequencing,Safety Awareness,Problem Solve,Perception     Visit Diagnosis: 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 Neuropathic pain 04/09/2020  . Pain   . Spastic hemiparesis (HCC)   . History of hypertension   . Dyslipidemia   . 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 Leukocytosis 01/12/2020  . Cerebrovascular accident (CVA) due to thrombosis of right middle cerebral artery (HCC)   . Chronic pain syndrome   . Hypokalemia   . Dysphagia, post-stroke   . 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 Tobacco dependence   . Marijuana dependence (HCC)   . Alcohol dependence (HCC)   Managed medicaid CPT codes: 72536- Therapeutic Exercise, [phone]- Neuro Re-education, [phone] - Manual Therapy, [address]30 - Therapeutic Activities, [phone] - Self Care, [phone] - Electrical stimulation (Manual), [phone] - Orthotic Fit, [id number] - Aquatic therapy and [phone] - Fluidotherapy   Collier Salina 01/15/2021, 7:04 PM  [facility] [address] [place], [place], [address] Phone: [phone]   Fax:  [phone]  Name: KAVIN WECKWERTH MRN: [id number] Date of Birth: Jul 13, 1976

## 2021-01-15 NOTE — Therapy (Signed)
[facility] [address] [place], [place], [address] Phone: [phone]   Fax:  [phone]  Physical Therapy Treatment  Patient Details  Name: Derek [name] MRN: [id number] Date of Birth: August 25, 1976 Referring Provider (PT): Aliene Beams   Encounter Date: 01/15/2021   PT End of Session - 01/15/21 1547    Visit Number 2    Number of Visits 17    Authorization Type BCBS with medicaid secondary    PT Start Time 1438    PT Stop Time 1530    PT Time Calculation (min) 52 min    Equipment Utilized During Treatment Gait belt    Activity Tolerance Patient tolerated treatment well    Behavior During Therapy Flat affect           Past Medical History:  Diagnosis Date  . 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 Alcohol dependence (HCC)   . Back pain   . Marijuana dependence (HCC)   . Tobacco dependence     Past Surgical History:  Procedure Laterality Date  . BUBBLE STUDY  01/10/2020   Procedure: BUBBLE STUDY;  Surgeon: Sande Rives, MD;  Location: [facility] ENDOSCOPY;  Service: Cardiovascular;;  . TEE WITHOUT CARDIOVERSION N/A 01/10/2020   Procedure: TRANSESOPHAGEAL ECHOCARDIOGRAM (TEE);  Surgeon: Sande Rives, MD;  Location: [facility] ENDOSCOPY;  Service: Cardiovascular;  Laterality: N/A;    There were no vitals filed for this visit.   Subjective Assessment - 01/15/21 1440    Subjective Pt says he is doing well and nothing new to report.  He is not currently doing any exercises at home.  His aunt has his old foot up brace but his current shoes he wears do not have laces and would not allow the brace to be worn properly.    Patient Stated Goals Pt would like to work on improving left leg strength, improve bend in it, to make it easier to walk.    Currently in Pain? No/denies    Pain Onset More than a month ago                             [facility] Adult PT Treatment/Exercise -  01/15/21 1455      Ambulation/Gait   Ambulation/Gait Yes    Ambulation/Gait Assistance 4: Min guard    Ambulation Distance (Feet) 300 Feet    Assistive device Large base quad cane    Gait Pattern Step-through pattern;Decreased arm swing - left;Decreased step length - left;Decreased stance time - left;Decreased hip/knee flexion - left;Lateral trunk lean to right    Ambulation Surface Level;Indoor   Improved weight shift to L after weight shifting and stepping exercises; adjusted height of cane and that had mild improvements to lateral trunk lean     Exercises   Exercises Knee/Hip      Knee/Hip Exercises: Standing   Other Standing Knee Exercises Weight shifting in parallel bars: 2x10, verbal and tactile ceuing to flex L quad and glute before weightshifting to L. UE support.  Mirror used to see weightshifting.  Verbal and tactile cueing needed to maintian neutral trunk position.    Other Standing Knee Exercises 2x10 Forward and backward steps with R.  UE support.  Verbal and tactile cues given to flex quad and glute, transfer weight to L before stepping. Added black foam to step over to encourage bigger steps and increase stand time on L.  2x10 Lateral stepping  with R.  Verbal cueing to take big steps and increase L stance time.  Verbal cueing needed to maintain trunk position.  2x10 Steps to 4in block.  Cued high hip flexion when L leg stepping to block.  Cued weighshifting when R stepping to block.      Knee/Hip Exercises: Supine   Heel Slides Strengthening;2 sets   8 reps, pillowcase over show,   Bridges Strengthening;2 sets   8 reps; verbal cueing to activate glutes first before lifting, maintaining a neutral pelvic alignment   Other Supine Knee/Hip Exercises Hooklying hip ABD   x8 without theraband, x8 green theraband                 PT Education - 01/15/21 1546    Education Details Initial HEP, weightshifting to L, orthotics - current brace would not be effective with current  shoewear becuase of lack of laces, potential toe cap for L shoe    Person(s) Educated Patient    Methods Explanation;Handout    Comprehension Verbalized understanding            PT Short Term Goals - 01/14/21 1142      PT SHORT TERM GOAL #1   Title Pt will be independent with HEP for strengthening, flexibility and balance.    Baseline Pt reports he is walking around home currently.    Time 4    Period Weeks    Status New    Target Date 02/13/21      PT SHORT TERM GOAL #2   Title Pt will ambulate >300' on level surfaces with quad cane mod I with decreased right trunk lean for improved household and short community distances.    Baseline 200' supervision/CGA with quad cane    Time 4    Period Weeks    Status New    Target Date 02/13/21      PT SHORT TERM GOAL #3   Title Pt will decrease 5 x sit to stand from 39.79 sec to <35 sec for improved balance and functional strength.    Baseline 01/13/21 39.79 sec from w/c with hand    Time 4    Period Weeks    Status New    Target Date 02/13/21             PT Long Term Goals - 01/14/21 1159      PT LONG TERM GOAL #1   Title Pt will be able to walk >10 minutes in therapy for improved activity tolerance and mobility.    Baseline <5 min    Time 8    Period Weeks    Status New    Target Date 03/15/21      PT LONG TERM GOAL #2   Title Patient will demonstrate ability to ambulate > 600 ft w/ LRAD supervision on outdoor/indoor surfaces to demo improved community mobility    Baseline 200' on level supervision/CGA    Time 8    Status New    Target Date 03/15/21      PT LONG TERM GOAL #3   Title Pt will decrease TUG from 31.28 sec to <35 sec for improved balance and decreased fall risk.    Baseline 01/13/21 31.28 sec    Time 8    Period Weeks    Status New    Target Date 03/15/21      PT LONG TERM GOAL #4   Title Pt will increase gait speed from 0.41m/s to >0.75m/s for improved household  mobility.    Baseline 01/13/21  0.19m/s    Time 8    Period Weeks    Status New    Target Date 03/15/21      PT LONG TERM GOAL #5   Title Pt will be able to negotiate up/down ramp and curb with quad cane CGA.    Baseline not performing    Time 8    Period Weeks    Status New    Target Date 03/15/21                 Plan - 01/15/21 1548    Clinical Impression Statement Pt tolerated all exercises well and there were noticable improvement in gait patterning after weightshifting and stepping exercises.  Adjusted cane height and the pt had noticable improvements in R trunk lean.  Pt needed constant verbal and tactile cueing to properly weight shift during all exercises and needs cueing to regain a more neutral trunk alignment.  During step negotiation pt was cued to have greater hip flexion moment due to increased L hip circumduction.    Personal Factors and Comorbidities Comorbidity 3+;Time since onset of injury/illness/exacerbation    Comorbidities Right MCA CVA with Spastic Hemiparesis 01/07/20, alchohol dependence, back pain, marijuana dependence, tobacco dependence.    Examination-Activity Limitations Transfers;Stairs;Locomotion Level;Stand    Examination-Participation Restrictions Community Activity;Cleaning    Stability/Clinical Decision Making Evolving/Moderate complexity    Rehab Potential Good    PT Frequency 2x / week   plus eval   PT Duration 8 weeks    PT Treatment/Interventions ADLs/Self Care Home Management;DME Instruction;Therapeutic activities;Stair training;Gait training;Therapeutic exercise;Balance training;Neuromuscular re-education;Patient/family education;Orthotic Fit/Training;Manual techniques;Passive range of motion;Vestibular    PT Next Visit Plan Gait training. Continue to work on weight shifting to left more with less right lateral trunk lean to clear leg. Check foot up brace if pt bring it in.    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 Neuropathic pain 04/09/2020  . Pain   . Spastic hemiparesis (HCC)   . History of hypertension   . Dyslipidemia   . 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 Leukocytosis 01/12/2020  . Cerebrovascular accident (CVA) due to thrombosis of right middle cerebral artery (HCC)   . Chronic pain syndrome   . Hypokalemia   . Dysphagia, post-stroke   . 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 Tobacco dependence   . Marijuana dependence (HCC)   . Alcohol dependence (HCC)     Brooke Dare, SPT 01/15/2021, 3:59 PM  [facility] [address] [place], [place], [address] Phone: [phone]   Fax:  [phone]  Name: Derek [name] MRN: [id number] Date of Birth: 02/08/1976

## 2021-01-15 NOTE — Patient Instructions (Signed)
Access Code: [email or web address] URL: [email or web address] Date: 01/15/2021 Prepared by: Elmer Bales  Exercises Supine Bridge - 2 x daily - 5 x weekly - 2 sets - 8 reps Bent Knee Fallouts - 2 x daily - 5 x weekly - 2 sets - 8 reps Supine Heel Slide - 2 x daily - 5 x weekly - 2 sets - 8 reps Seated Heel Slide - 1 x daily - 5 x weekly - 2 sets - 8 reps Sit to Stand with Armchair - 1 x daily - 5 x weekly - 3 sets - 5 reps Side to Side Weight Shift with Counter Support - 2 x daily - 7 x weekly - 2 sets - 10 reps

## 2021-01-20 ENCOUNTER — Other Ambulatory Visit: Payer: Self-pay

## 2021-01-20 ENCOUNTER — Ambulatory Visit: Payer: BC Managed Care – PPO

## 2021-01-20 DIAGNOSIS — M6281 Muscle weakness (generalized): Secondary | ICD-10-CM

## 2021-01-20 DIAGNOSIS — R41840 Attention and concentration deficit: Secondary | ICD-10-CM | POA: Diagnosis not present

## 2021-01-20 DIAGNOSIS — M79602 Pain in left arm: Secondary | ICD-10-CM | POA: Diagnosis not present

## 2021-01-20 DIAGNOSIS — R2689 Other abnormalities of gait and mobility: Secondary | ICD-10-CM | POA: Diagnosis not present

## 2021-01-20 DIAGNOSIS — G8110 Spastic hemiplegia affecting unspecified side: Secondary | ICD-10-CM | POA: Diagnosis not present

## 2021-01-20 DIAGNOSIS — R208 Other disturbances of skin sensation: Secondary | ICD-10-CM | POA: Diagnosis not present

## 2021-01-20 DIAGNOSIS — R293 Abnormal posture: Secondary | ICD-10-CM | POA: Diagnosis not present

## 2021-01-20 DIAGNOSIS — I69354 Hemiplegia and hemiparesis following cerebral infarction affecting left non-dominant side: Secondary | ICD-10-CM | POA: Diagnosis not present

## 2021-01-20 DIAGNOSIS — R2681 Unsteadiness on feet: Secondary | ICD-10-CM | POA: Diagnosis not present

## 2021-01-20 NOTE — Therapy (Signed)
[facility] [address] [place], [place], [address] Phone: [phone]   Fax:  [phone]  Physical Therapy Treatment  Patient Details  Name: Derek [name] MRN: [id number] Date of Birth: January 31, 1976 Referring Provider (PT): Aliene Beams   Encounter Date: 01/20/2021   PT End of Session - 01/20/21 1316    Visit Number 3    Number of Visits 17    Authorization Type BCBS with medicaid secondary    PT Start Time 1314    PT Stop Time 1402    PT Time Calculation (min) 48 min    Equipment Utilized During Treatment Gait belt    Activity Tolerance Patient tolerated treatment well    Behavior During Therapy Flat affect           Past Medical History:  Diagnosis Date  . 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 Alcohol dependence (HCC)   . Back pain   . Marijuana dependence (HCC)   . Tobacco dependence     Past Surgical History:  Procedure Laterality Date  . BUBBLE STUDY  01/10/2020   Procedure: BUBBLE STUDY;  Surgeon: Sande Rives, MD;  Location: [facility] ENDOSCOPY;  Service: Cardiovascular;;  . TEE WITHOUT CARDIOVERSION N/A 01/10/2020   Procedure: TRANSESOPHAGEAL ECHOCARDIOGRAM (TEE);  Surgeon: Sande Rives, MD;  Location: [facility] ENDOSCOPY;  Service: Cardiovascular;  Laterality: N/A;    There were no vitals filed for this visit.   Subjective Assessment - 01/20/21 1316    Subjective Pt reports that he did try the exercises and they went well.    Patient Stated Goals Pt would like to work on improving left leg strength, improve bend in it, to make it easier to walk.    Currently in Pain? Yes    Pain Score 3     Pain Location Shoulder    Pain Orientation Left    Pain Descriptors / Indicators Aching    Pain Onset More than a month ago    Pain Frequency Intermittent                             [facility] Adult PT Treatment/Exercise - 01/20/21 1317      Ambulation/Gait    Ambulation/Gait Yes    Ambulation/Gait Assistance 5: Supervision;4: Min guard    Ambulation/Gait Assistance Details Pt ambulated at beginning of session with verbal cues to increase left weight shift. At end of sesion PT provided tactile cues to right lateral trunk and left pelvis for upright trunk to decrease right trunk lean and encourage left hip flexion to help clear foot more. Pt with improved posture compared to last session.    Ambulation Distance (Feet) 115 Feet   115' x 1 at end   Assistive device Large base quad cane    Gait Pattern Step-through pattern;Decreased arm swing - left;Decreased stance time - left;Decreased hip/knee flexion - left;Decreased dorsiflexion - left    Ambulation Surface Level;Indoor      Neuro Re-ed    Neuro Re-ed Details  In // bars: reciprocal steps over 4 yard sticks with 1 UE support x 6 bouts. Stepping over yard stick with LLE and back x 10 with PT assisting to get some knee flexion and preventing hip hike. Stepping left leg back and then up to other foot x 10 with PT blocking trunk flexion which challenged pt with decreased movement. Forward and lateral step up on 4" step with  LLE with foot staying on step the whole time with verbal and tactile cues for form. Stepping LLE forward with tactile cues to left ASIS to push in to therapist's hand to try to facilitate some anterior pelvic rotation and weight shift. Standing in front of mirror working on upright trunk with no leaning stepping forward and back with LLE x 5 with tactile cues.      Exercises   Exercises Knee/Hip      Knee/Hip Exercises: Seated   Hamstring Curl AROM    Hamstring Limitations x 10 with foot on foam roll with verbal cues not to lean away but sit up tall. Only partial motion.      Knee/Hip Exercises: Supine   Engineer, production Limitations x 10 with tactile cues at left ASIS to push up more and verbal cues to control descent    Other Supine Knee/Hip Exercises LLE on red  physioball: hip/knee flexion x 10 with resistance in to extension then repeated with resistance in both directions and having patient hold for 5 sec at top.                  PT Education - 01/20/21 1557    Education Details Pt to continue current HEP. To focus on keeping trunk straight with walking and shift weight more over left.    Person(s) Educated Patient    Methods Explanation    Comprehension Verbalized understanding            PT Short Term Goals - 01/14/21 1142      PT SHORT TERM GOAL #1   Title Pt will be independent with HEP for strengthening, flexibility and balance.    Baseline Pt reports he is walking around home currently.    Time 4    Period Weeks    Status New    Target Date 02/13/21      PT SHORT TERM GOAL #2   Title Pt will ambulate >300' on level surfaces with quad cane mod I with decreased right trunk lean for improved household and short community distances.    Baseline 200' supervision/CGA with quad cane    Time 4    Period Weeks    Status New    Target Date 02/13/21      PT SHORT TERM GOAL #3   Title Pt will decrease 5 x sit to stand from 39.79 sec to <35 sec for improved balance and functional strength.    Baseline 01/13/21 39.79 sec from w/c with hand    Time 4    Period Weeks    Status New    Target Date 02/13/21             PT Long Term Goals - 01/14/21 1159      PT LONG TERM GOAL #1   Title Pt will be able to walk >10 minutes in therapy for improved activity tolerance and mobility.    Baseline <5 min    Time 8    Period Weeks    Status New    Target Date 03/15/21      PT LONG TERM GOAL #2   Title Patient will demonstrate ability to ambulate > 600 ft w/ LRAD supervision on outdoor/indoor surfaces to demo improved community mobility    Baseline 200' on level supervision/CGA    Time 8    Status New    Target Date 03/15/21      PT LONG TERM GOAL #3  Title Pt will decrease TUG from 31.28 sec to <35 sec for improved balance  and decreased fall risk.    Baseline 01/13/21 31.28 sec    Time 8    Period Weeks    Status New    Target Date 03/15/21      PT LONG TERM GOAL #4   Title Pt will increase gait speed from 0.41m/s to >0.55m/s for improved household mobility.    Baseline 01/13/21 0.33m/s    Time 8    Period Weeks    Status New    Target Date 03/15/21      PT LONG TERM GOAL #5   Title Pt will be able to negotiate up/down ramp and curb with quad cane CGA.    Baseline not performing    Time 8    Period Weeks    Status New    Target Date 03/15/21                 Plan - 01/20/21 1557    Clinical Impression Statement Pt had improved posture with gait today with less right trunk lean to compensate for decreased left hip flexion. Hip flexion and extension still limited on left.    Personal Factors and Comorbidities Comorbidity 3+;Time since onset of injury/illness/exacerbation    Comorbidities Right MCA CVA with Spastic Hemiparesis 01/07/20, alchohol dependence, back pain, marijuana dependence, tobacco dependence.    Examination-Activity Limitations Transfers;Stairs;Locomotion Level;Stand    Examination-Participation Restrictions Community Activity;Cleaning    Stability/Clinical Decision Making Evolving/Moderate complexity    Rehab Potential Good    PT Frequency 2x / week   plus eval   PT Duration 8 weeks    PT Treatment/Interventions ADLs/Self Care Home Management;DME Instruction;Therapeutic activities;Stair training;Gait training;Therapeutic exercise;Balance training;Neuromuscular re-education;Patient/family education;Orthotic Fit/Training;Manual techniques;Passive range of motion;Vestibular    PT Next Visit Plan Gait training. Continue to work on weight shifting to left more with less right lateral trunk lean to clear leg. Left hip flexor and extensor strengthening. Check foot up brace if pt brings it in but would not work in his new sneakers.    Consulted and Agree with Plan of Care Patient            Patient will benefit from skilled therapeutic intervention in order to improve the following deficits and impairments:  Abnormal gait,Decreased knowledge of use of DME,Decreased endurance,Decreased mobility,Decreased range of motion,Decreased strength,Impaired flexibility,Impaired tone,Impaired UE functional use,Postural dysfunction  Visit Diagnosis: Other abnormalities of gait and mobility  Muscle weakness (generalized)     Problem List Patient Active Problem List   Diagnosis Date Noted  . Neuropathic pain 04/09/2020  . Pain   . Spastic hemiparesis (HCC)   . History of hypertension   . Dyslipidemia   . 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 Leukocytosis 01/12/2020  . Cerebrovascular accident (CVA) due to thrombosis of right middle cerebral artery (HCC)   . Chronic pain syndrome   . Hypokalemia   . Dysphagia, post-stroke   . 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 Tobacco dependence   . Marijuana dependence (HCC)   . Alcohol dependence (HCC)     Ronn Melena, PT, DPT, NCS 01/20/2021, 4:00 PM  [facility] [address] [place], [place], [address] Phone: [phone]   Fax:  [phone]  Name: Derek [name] MRN: [id number] Date of Birth: 10-May-1976

## 2021-01-22 ENCOUNTER — Other Ambulatory Visit: Payer: Self-pay

## 2021-01-22 ENCOUNTER — Ambulatory Visit: Payer: BC Managed Care – PPO

## 2021-01-22 ENCOUNTER — Ambulatory Visit: Payer: BC Managed Care – PPO | Admitting: Occupational Therapy

## 2021-01-22 DIAGNOSIS — R41840 Attention and concentration deficit: Secondary | ICD-10-CM | POA: Diagnosis not present

## 2021-01-22 DIAGNOSIS — I69354 Hemiplegia and hemiparesis following cerebral infarction affecting left non-dominant side: Secondary | ICD-10-CM | POA: Diagnosis not present

## 2021-01-22 DIAGNOSIS — M6281 Muscle weakness (generalized): Secondary | ICD-10-CM | POA: Diagnosis not present

## 2021-01-22 DIAGNOSIS — R2681 Unsteadiness on feet: Secondary | ICD-10-CM | POA: Diagnosis not present

## 2021-01-22 DIAGNOSIS — M79602 Pain in left arm: Secondary | ICD-10-CM | POA: Diagnosis not present

## 2021-01-22 DIAGNOSIS — G8110 Spastic hemiplegia affecting unspecified side: Secondary | ICD-10-CM

## 2021-01-22 DIAGNOSIS — R293 Abnormal posture: Secondary | ICD-10-CM | POA: Diagnosis not present

## 2021-01-22 DIAGNOSIS — R2689 Other abnormalities of gait and mobility: Secondary | ICD-10-CM

## 2021-01-22 DIAGNOSIS — R208 Other disturbances of skin sensation: Secondary | ICD-10-CM | POA: Diagnosis not present

## 2021-01-22 NOTE — Therapy (Signed)
[facility] [address] [place], [place], [address] Phone: [phone]   Fax:  [phone]  Physical Therapy Treatment  Patient Details  Name: Derek [name] MRN: [id number] Date of Birth: 10-12-1976 Referring Provider (PT): Aliene Beams   Encounter Date: 01/22/2021   PT End of Session - 01/22/21 1448    Visit Number 4    Number of Visits 17    Authorization Type BCBS with medicaid secondary    PT Start Time 1446    PT Stop Time 1526    PT Time Calculation (min) 40 min    Equipment Utilized During Treatment --    Activity Tolerance Patient tolerated treatment well    Behavior During Therapy Flat affect           Past Medical History:  Diagnosis Date  . 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 Alcohol dependence (HCC)   . Back pain   . Marijuana dependence (HCC)   . Tobacco dependence     Past Surgical History:  Procedure Laterality Date  . BUBBLE STUDY  01/10/2020   Procedure: BUBBLE STUDY;  Surgeon: Sande Rives, MD;  Location: [facility] ENDOSCOPY;  Service: Cardiovascular;;  . TEE WITHOUT CARDIOVERSION N/A 01/10/2020   Procedure: TRANSESOPHAGEAL ECHOCARDIOGRAM (TEE);  Surgeon: Sande Rives, MD;  Location: [facility] ENDOSCOPY;  Service: Cardiovascular;  Laterality: N/A;    There were no vitals filed for this visit.   Subjective Assessment - 01/22/21 1448    Subjective Pt wore his old shoes with the foot up brace today for therapist to see.    Patient Stated Goals Pt would like to work on improving left leg strength, improve bend in it, to make it easier to walk.    Currently in Pain? Yes    Pain Score 4     Pain Location Shoulder    Pain Orientation Left    Pain Descriptors / Indicators Aching    Pain Type Chronic pain    Pain Onset More than a month ago    Pain Frequency Intermittent                             [facility] Adult PT Treatment/Exercise - 01/22/21 1449       Ambulation/Gait   Ambulation/Gait Yes    Ambulation/Gait Assistance 5: Supervision    Ambulation/Gait Assistance Details Pt was cued to stay up tall and try to left more from left hip to increase foot clearance.    Ambulation Distance (Feet) 230 Feet   115' x 1   Assistive device Large base quad cane   left foot up brace   Gait Pattern Step-through pattern;Decreased hip/knee flexion - left;Decreased dorsiflexion - left    Ambulation Surface Level;Indoor      Neuro Re-ed    Neuro Re-ed Details  Standing in front of mirror: stepping forward and back with LLE with quad cane support with red theraband around pelvis to try to facilitate more left pelvic rotation with step x 10. Sit to stand in front of mirror without hands with red theraband resistance posterior 5 x 2 with verbal and visual cues to keep weight to left more. Stepping LLE forward and reaching up for 2.2# med ball with RUE to elongate on right and then step back holding ball x 10.      Exercises   Exercises Other Exercises    Other Exercises  Seated left hip  flexion x 10 with verbal cues for upright posture. Performed through partial range. Then standing with PT blocking hip hike tapping LLE on 2" step x 10      Knee/Hip Exercises: Aerobic   Other Aerobic Sci Fit x 6 min level 4 legs only for strengthening.                    PT Short Term Goals - 01/14/21 1142      PT SHORT TERM GOAL #1   Title Pt will be independent with HEP for strengthening, flexibility and balance.    Baseline Pt reports he is walking around home currently.    Time 4    Period Weeks    Status New    Target Date 02/13/21      PT SHORT TERM GOAL #2   Title Pt will ambulate >300' on level surfaces with quad cane mod I with decreased right trunk lean for improved household and short community distances.    Baseline 200' supervision/CGA with quad cane    Time 4    Period Weeks    Status New    Target Date 02/13/21      PT SHORT TERM  GOAL #3   Title Pt will decrease 5 x sit to stand from 39.79 sec to <35 sec for improved balance and functional strength.    Baseline 01/13/21 39.79 sec from w/c with hand    Time 4    Period Weeks    Status New    Target Date 02/13/21             PT Long Term Goals - 01/14/21 1159      PT LONG TERM GOAL #1   Title Pt will be able to walk >10 minutes in therapy for improved activity tolerance and mobility.    Baseline <5 min    Time 8    Period Weeks    Status New    Target Date 03/15/21      PT LONG TERM GOAL #2   Title Patient will demonstrate ability to ambulate > 600 ft w/ LRAD supervision on outdoor/indoor surfaces to demo improved community mobility    Baseline 200' on level supervision/CGA    Time 8    Status New    Target Date 03/15/21      PT LONG TERM GOAL #3   Title Pt will decrease TUG from 31.28 sec to <35 sec for improved balance and decreased fall risk.    Baseline 01/13/21 31.28 sec    Time 8    Period Weeks    Status New    Target Date 03/15/21      PT LONG TERM GOAL #4   Title Pt will increase gait speed from 0.54m/s to >0.89m/s for improved household mobility.    Baseline 01/13/21 0.45m/s    Time 8    Period Weeks    Status New    Target Date 03/15/21      PT LONG TERM GOAL #5   Title Pt will be able to negotiate up/down ramp and curb with quad cane CGA.    Baseline not performing    Time 8    Period Weeks    Status New    Target Date 03/15/21                 Plan - 01/22/21 1857    Clinical Impression Statement PT continued to work on trying to improve left foot clearance  with focus on hip flexion and getting more pelvic rotation to decrease compensatory movements. Pt with more upright posture with gait. Did not notice significant improvement with foot clearance with left foot up brace today compared to without but toe cap did help foot slide through when would scuff better.    Personal Factors and Comorbidities Comorbidity 3+;Time  since onset of injury/illness/exacerbation    Comorbidities Right MCA CVA with Spastic Hemiparesis 01/07/20, alchohol dependence, back pain, marijuana dependence, tobacco dependence.    Examination-Activity Limitations Transfers;Stairs;Locomotion Level;Stand    Examination-Participation Restrictions Community Activity;Cleaning    Stability/Clinical Decision Making Evolving/Moderate complexity    Rehab Potential Good    PT Frequency 2x / week   plus eval   PT Duration 8 weeks    PT Treatment/Interventions ADLs/Self Care Home Management;DME Instruction;Therapeutic activities;Stair training;Gait training;Therapeutic exercise;Balance training;Neuromuscular re-education;Patient/family education;Orthotic Fit/Training;Manual techniques;Passive range of motion;Vestibular    PT Next Visit Plan Gait training. Continue to work on weight shifting to left more with less right lateral trunk lean to clear leg. Left hip flexor and extensor strengthening.    Consulted and Agree with Plan of Care Patient           Patient will benefit from skilled therapeutic intervention in order to improve the following deficits and impairments:  Abnormal gait,Decreased knowledge of use of DME,Decreased endurance,Decreased mobility,Decreased range of motion,Decreased strength,Impaired flexibility,Impaired tone,Impaired UE functional use,Postural dysfunction  Visit Diagnosis: Other abnormalities of gait and mobility  Muscle weakness (generalized)     Problem List Patient Active Problem List   Diagnosis Date Noted  . Neuropathic pain 04/09/2020  . Pain   . Spastic hemiparesis (HCC)   . History of hypertension   . Dyslipidemia   . 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 Leukocytosis 01/12/2020  . Cerebrovascular accident (CVA) due to thrombosis of right middle cerebral artery (HCC)   . Chronic pain syndrome   . Hypokalemia   . Dysphagia, post-stroke   . Acute ischemic right MCA stroke (HCC)  01/07/2020  . Tobacco dependence   . Marijuana dependence (HCC)   . Alcohol dependence (HCC)     Ronn Melena, PT, DPT, NCS 01/22/2021, 7:01 PM  [facility] [address] [place], [place], [address] Phone: [phone]   Fax:  [phone]  Name: Derek [name] MRN: [id number] Date of Birth: [birth year]

## 2021-01-22 NOTE — Therapy (Signed)
[facility] [address] [place], [place], [address] Phone: [phone]   Fax:  [phone]  Occupational Therapy Treatment  Patient Details  Name: Derek [name] MRN: [id number] Date of Birth: 07-20-1976 Referring Provider (OT): Aliene Beams   Encounter Date: 01/22/2021   OT End of Session - 01/22/21 1530    Visit Number 2    Number of Visits 13    Date for OT Re-Evaluation 03/29/21    Authorization Type BCBS and [facility] Medicaid    Authorization Time Period awaiting auth    OT Start Time 1530    OT Stop Time 1615    OT Time Calculation (min) 45 min    Activity Tolerance Other (comment)    Behavior During Therapy Flat affect           Past Medical History:  Diagnosis Date  . 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 Alcohol dependence (HCC)   . Back pain   . Marijuana dependence (HCC)   . Tobacco dependence     Past Surgical History:  Procedure Laterality Date  . BUBBLE STUDY  01/10/2020   Procedure: BUBBLE STUDY;  Surgeon: Sande Rives, MD;  Location: [facility] ENDOSCOPY;  Service: Cardiovascular;;  . TEE WITHOUT CARDIOVERSION N/A 01/10/2020   Procedure: TRANSESOPHAGEAL ECHOCARDIOGRAM (TEE);  Surgeon: Sande Rives, MD;  Location: [facility] ENDOSCOPY;  Service: Cardiovascular;  Laterality: N/A;    There were no vitals filed for this visit.   Subjective Assessment - 01/22/21 1530    Subjective  "it still don't work" (talking about hand)    Currently in Pain? Yes    Pain Score 4     Pain Location Shoulder    Pain Orientation Left    Pain Descriptors / Indicators Aching    Pain Type Chronic pain    Pain Onset More than a month ago    Pain Frequency Intermittent               TREATMENT:  UB Dressing: pt doffed long sleeve shirt with supervision and doffed with min A - pt required assistance for threading LUE and for pulling shirt over L shoulder. Pt reports doffing pants  independently.    Moist Heat: x 12 minutes to LUE shoulder in supine while completing self-PROM exercises.  Self PROM in supine x 10 reps of shoulder flexion, chest press and horizontal abduction  Neuromuscular Reeducation LUE with working on active and active assisted range of motion for elbow flexion/extension. Pt demonstrates active flexion - facilitated by tone, and minimal to no active extension.  Manual Therapy to LUE wrist and hand. Provided traction and mobility to increase range of motion and inhibite tone. Pt with significant flexor tone in wrist and hand making passive range of motion difficult.                   OT Short Term Goals - 01/13/21 1537      OT SHORT TERM GOAL #1   Title Patient will complete a self stretching program for left upper extremity    Baseline not currently performing consistently    Time 4    Period Weeks    Status New    Target Date 02/27/21      OT SHORT TERM GOAL #2   Title Patient will dress upper body, pull over short sleeve tshirt with increased time and min assist    Baseline mod- total assist    Time 4  Period Weeks      OT SHORT TERM GOAL #3   Title Patient will report pain of less than 4/10 with forward low reaching tasks    Baseline Pain ranges toward 7-8/10    Time 4    Period Weeks    Status New      OT SHORT TERM GOAL #4   Title Pt to bathe self with min assist seated    Baseline Patient  actively participates in bathing with max assist    Time 4    Period Weeks    Status New      OT SHORT TERM GOAL #5   Title Pt to don/doff a front opening shirt or jacket with mod assist    Baseline max assist    Time 4    Period Weeks    Status New             OT Long Term Goals - 01/13/21 1543      OT LONG TERM GOAL #1   Title Patient will complete an updated HEP to address movement in LUE    Baseline Inconsistent performance of HEP    Time 6    Period Weeks    Status New    Target Date 03/29/21      OT  LONG TERM GOAL #2   Title Patient will transfer into and out of shower with supervision    Baseline min assist    Time 6    Period Weeks    Status New      OT LONG TERM GOAL #3   Title Pt to verbalize understanding with potential A/E needs and task modifications to increase ease with ADLS and simple IADLS    Baseline Unable to verbalize    Time 6    Period Weeks    Status New      OT LONG TERM GOAL #4   Title Pt to be mod I level with bathing and UE dressing and min assist for LE dressing    Baseline mod-dependence    Time 6    Status New      OT LONG TERM GOAL #5   Title xxx                 Plan - 01/22/21 1652    Clinical Impression Statement Pt did well demonstrating UB dressing and reports increased independence with LB dressing. Pt with significant tone in LUE but reports increase in ability to stretch elbow since botox.    OT Occupational Profile and History Detailed Assessment- Review of Records and additional review of physical, cognitive, psychosocial history related to current functional performance    Occupational performance deficits (Please refer to evaluation for details): ADL's;IADL's;Rest and Sleep    Body Structure / Function / Physical Skills ADL;Coordination;Endurance;GMC;Muscle spasms;UE functional use;Balance;Decreased knowledge of precautions;Sensation;Vision;Pain;IADL;Flexibility;Decreased knowledge of use of DME;Body mechanics;Dexterity;FMC;Proprioception;Strength;Tone;ROM;Mobility;Edema    Cognitive Skills Attention;Emotional;Energy/Drive;Learn;Memory;Sequencing;Safety Awareness;Problem Solve;Perception    Rehab Potential Fair    Clinical Decision Making Several treatment options, min-mod task modification necessary    Comorbidities Affecting Occupational Performance: May have comorbidities impacting occupational performance    Modification or Assistance to Complete Evaluation  Min-Moderate modification of tasks or assist with assess necessary to  complete eval    OT Frequency 2x / week    OT Duration 6 weeks    OT Treatment/Interventions Self-care/ADL training;Aquatic Therapy;Electrical Stimulation;Therapeutic exercise;Visual/perceptual remediation/compensation;Moist Heat;Neuromuscular education;Splinting;Patient/family education;Balance training;Therapeutic activities;Functional Mobility Training;Fluidtherapy;Ultrasound;Cryotherapy;Contrast Bath;DME and/or AE instruction;Manual Therapy;Passive range of motion;Cognitive remediation/compensation  Plan review UB dressing, self-PROM, weight beaaring if able    OT Home Exercise Plan review prior exercises    Consulted and Agree with Plan of Care Patient           Patient will benefit from skilled therapeutic intervention in order to improve the following deficits and impairments:   Body Structure / Function / Physical Skills: ADL,Coordination,Endurance,GMC,Muscle spasms,UE functional use,Balance,Decreased knowledge of precautions,Sensation,Vision,Pain,IADL,Flexibility,Decreased knowledge of use of DME,Body mechanics,Dexterity,FMC,Proprioception,Strength,Tone,ROM,Mobility,Edema Cognitive Skills: Attention,Emotional,Energy/Drive,Learn,Memory,Sequencing,Safety Awareness,Problem Solve,Perception     Visit Diagnosis: 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 Neuropathic pain 04/09/2020  . Pain   . Spastic hemiparesis (HCC)   . History of hypertension   . Dyslipidemia   . 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 Leukocytosis 01/12/2020  . Cerebrovascular accident (CVA) due to thrombosis of right middle cerebral artery (HCC)   . Chronic pain syndrome   . Hypokalemia   . Dysphagia,  post-stroke   . 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 Tobacco dependence   . Marijuana dependence (HCC)   . Alcohol dependence (HCC)     Junious Dresser MOT, OTR/L  01/22/2021, 4:57 PM  [facility] [address] [place], [place], [address] Phone: [phone]   Fax:  [phone]  Name: Derek [name] MRN: [id number] Date of Birth: [birth year]

## 2021-01-27 ENCOUNTER — Ambulatory Visit: Payer: BC Managed Care – PPO

## 2021-01-27 ENCOUNTER — Other Ambulatory Visit: Payer: Self-pay

## 2021-01-27 ENCOUNTER — Encounter: Payer: Self-pay | Admitting: Occupational Therapy

## 2021-01-27 ENCOUNTER — Ambulatory Visit: Payer: BC Managed Care – PPO | Admitting: Occupational Therapy

## 2021-01-27 DIAGNOSIS — G8110 Spastic hemiplegia affecting unspecified side: Secondary | ICD-10-CM

## 2021-01-27 DIAGNOSIS — M79602 Pain in left arm: Secondary | ICD-10-CM | POA: Diagnosis not present

## 2021-01-27 DIAGNOSIS — R208 Other disturbances of skin sensation: Secondary | ICD-10-CM | POA: Diagnosis not present

## 2021-01-27 DIAGNOSIS — M6281 Muscle weakness (generalized): Secondary | ICD-10-CM

## 2021-01-27 DIAGNOSIS — R41840 Attention and concentration deficit: Secondary | ICD-10-CM | POA: Diagnosis not present

## 2021-01-27 DIAGNOSIS — I69354 Hemiplegia and hemiparesis following cerebral infarction affecting left non-dominant side: Secondary | ICD-10-CM | POA: Diagnosis not present

## 2021-01-27 DIAGNOSIS — R2681 Unsteadiness on feet: Secondary | ICD-10-CM

## 2021-01-27 DIAGNOSIS — R2689 Other abnormalities of gait and mobility: Secondary | ICD-10-CM | POA: Diagnosis not present

## 2021-01-27 DIAGNOSIS — R293 Abnormal posture: Secondary | ICD-10-CM | POA: Diagnosis not present

## 2021-01-27 NOTE — Therapy (Signed)
[facility] [address] [place], [place], [address] Phone: [phone]   Fax:  [phone]  Occupational Therapy Treatment  Patient Details  Name: Derek [name] MRN: [id number] Date of Birth: 1976-07-14 Referring Provider (OT): Aliene Beams   Encounter Date: 01/27/2021   OT End of Session - 01/27/21 1619    Visit Number 3    Number of Visits 13    Date for OT Re-Evaluation 03/29/21    Authorization Type BCBS and [facility] Medicaid    Authorization Time Period awaiting auth    OT Start Time 1617    OT Stop Time 1700    OT Time Calculation (min) 43 min    Activity Tolerance Other (comment)    Behavior During Therapy Flat affect           Past Medical History:  Diagnosis Date  . 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 Alcohol dependence (HCC)   . Back pain   . Marijuana dependence (HCC)   . Tobacco dependence     Past Surgical History:  Procedure Laterality Date  . BUBBLE STUDY  01/10/2020   Procedure: BUBBLE STUDY;  Surgeon: Sande Rives, MD;  Location: [facility] ENDOSCOPY;  Service: Cardiovascular;;  . TEE WITHOUT CARDIOVERSION N/A 01/10/2020   Procedure: TRANSESOPHAGEAL ECHOCARDIOGRAM (TEE);  Surgeon: Sande Rives, MD;  Location: [facility] ENDOSCOPY;  Service: Cardiovascular;  Laterality: N/A;    There were no vitals filed for this visit.   Subjective Assessment - 01/27/21 1619    Subjective  "just a little stiffness"    Currently in Pain? Yes    Pain Score 3     Pain Location Shoulder    Pain Orientation Left    Pain Descriptors / Indicators Aching    Pain Type Chronic pain    Pain Onset More than a month ago    Pain Frequency Constant             TREATMENT:  Moist Heat: LUE shoulder while supine for 10 minutes while engaging in neuro reed for LUE elbow flexion/extension. Pt with increased ability to maintaining 90 degrees of elbow flexion while in supine and control during  elbow flexion movements. Pt with limited, possibly trace, activation in LUE elbow extension.   Hemi Glide: with LUE with total assistance for forward reaching/scap retraction. Pt with report of pain in LUE shoulder during forward reaching. Pt was able to maintaining grasp on hemi glide  Weight Bearing: weight bearing in LUE while reaching to floor and up to ceiling in diagonal pattern x 5 while seated edge of mat. Pt reached to floor for 10 cones and reached to varying heights and distances for increased weight bearing into LUE palm. Max difficulty for passive movement for LUE hand and wrist and boutonniere present in long finger on LUE.   Tranfers: w/c<>edge of mat with supervision with quad cane. Sitting <> supine with contact guard assistance.                    OT Short Term Goals - 01/13/21 1537      OT SHORT TERM GOAL #1   Title Patient will complete a self stretching program for left upper extremity    Baseline not currently performing consistently    Time 4    Period Weeks    Status New    Target Date 02/27/21      OT SHORT TERM GOAL #2   Title Patient will  dress upper body, pull over short sleeve tshirt with increased time and min assist    Baseline mod- total assist    Time 4    Period Weeks      OT SHORT TERM GOAL #3   Title Patient will report pain of less than 4/10 with forward low reaching tasks    Baseline Pain ranges toward 7-8/10    Time 4    Period Weeks    Status New      OT SHORT TERM GOAL #4   Title Pt to bathe self with min assist seated    Baseline Patient  actively participates in bathing with max assist    Time 4    Period Weeks    Status New      OT SHORT TERM GOAL #5   Title Pt to don/doff a front opening shirt or jacket with mod assist    Baseline max assist    Time 4    Period Weeks    Status New             OT Long Term Goals - 01/13/21 1543      OT LONG TERM GOAL #1   Title Patient will complete an updated HEP to  address movement in LUE    Baseline Inconsistent performance of HEP    Time 6    Period Weeks    Status New    Target Date 03/29/21      OT LONG TERM GOAL #2   Title Patient will transfer into and out of shower with supervision    Baseline min assist    Time 6    Period Weeks    Status New      OT LONG TERM GOAL #3   Title Pt to verbalize understanding with potential A/E needs and task modifications to increase ease with ADLS and simple IADLS    Baseline Unable to verbalize    Time 6    Period Weeks    Status New      OT LONG TERM GOAL #4   Title Pt to be mod I level with bathing and UE dressing and min assist for LE dressing    Baseline mod-dependence    Time 6    Status New      OT LONG TERM GOAL #5   Title xxx                 Plan - 01/27/21 1708    Clinical Impression Statement Pt progressing towards goals. Pt receptive to self PROM and report carry over - continue review. Pt presented with somewhat improvement in tone and movement in LUE this day but continues to be minimal.    OT Occupational Profile and History Detailed Assessment- Review of Records and additional review of physical, cognitive, psychosocial history related to current functional performance    Occupational performance deficits (Please refer to evaluation for details): ADL's;IADL's;Rest and Sleep    Body Structure / Function / Physical Skills ADL;Coordination;Endurance;GMC;Muscle spasms;UE functional use;Balance;Decreased knowledge of precautions;Sensation;Vision;Pain;IADL;Flexibility;Decreased knowledge of use of DME;Body mechanics;Dexterity;FMC;Proprioception;Strength;Tone;ROM;Mobility;Edema    Cognitive Skills Attention;Emotional;Energy/Drive;Learn;Memory;Sequencing;Safety Awareness;Problem Solve;Perception    Rehab Potential Fair    Clinical Decision Making Several treatment options, min-mod task modification necessary    Comorbidities Affecting Occupational Performance: May have comorbidities  impacting occupational performance    Modification or Assistance to Complete Evaluation  Min-Moderate modification of tasks or assist with assess necessary to complete eval    OT Frequency 2x / week  OT Duration 6 weeks    OT Treatment/Interventions Self-care/ADL training;Aquatic Therapy;Electrical Stimulation;Therapeutic exercise;Visual/perceptual remediation/compensation;Moist Heat;Neuromuscular education;Splinting;Patient/family education;Balance training;Therapeutic activities;Functional Mobility Training;Fluidtherapy;Ultrasound;Cryotherapy;Contrast Bath;DME and/or AE instruction;Manual Therapy;Passive range of motion;Cognitive remediation/compensation    Plan review UB dressing, self-PROM, weight bearing, moist heat on LUE shoulder    OT Home Exercise Plan review prior exercises    Consulted and Agree with Plan of Care Patient           Patient will benefit from skilled therapeutic intervention in order to improve the following deficits and impairments:   Body Structure / Function / Physical Skills: ADL,Coordination,Endurance,GMC,Muscle spasms,UE functional use,Balance,Decreased knowledge of precautions,Sensation,Vision,Pain,IADL,Flexibility,Decreased knowledge of use of DME,Body mechanics,Dexterity,FMC,Proprioception,Strength,Tone,ROM,Mobility,Edema Cognitive Skills: Attention,Emotional,Energy/Drive,Learn,Memory,Sequencing,Safety Awareness,Problem Solve,Perception     Visit Diagnosis: Muscle weakness (generalized)  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 Neuropathic pain 04/09/2020  . Pain   . Spastic hemiparesis (HCC)   . History of hypertension   . Dyslipidemia   . 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  Leukocytosis 01/12/2020  . Cerebrovascular accident (CVA) due to thrombosis of right middle cerebral artery (HCC)   . Chronic pain syndrome   . Hypokalemia   . Dysphagia, post-stroke   . 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 Tobacco dependence   . Marijuana dependence (HCC)   . Alcohol dependence (HCC)     Junious Dresser MOT, OTR/L  01/27/2021, 5:10 PM  [facility] [address] [place], [place], [address] Phone: [phone]   Fax:  [phone]  Name: Derek [name] MRN: [id number] Date of Birth: October 05, 1976

## 2021-01-30 ENCOUNTER — Ambulatory Visit: Payer: BC Managed Care – PPO | Attending: Family Medicine | Admitting: Occupational Therapy

## 2021-01-30 ENCOUNTER — Ambulatory Visit: Payer: BC Managed Care – PPO

## 2021-01-30 ENCOUNTER — Other Ambulatory Visit: Payer: Self-pay

## 2021-01-30 DIAGNOSIS — R2689 Other abnormalities of gait and mobility: Secondary | ICD-10-CM | POA: Diagnosis not present

## 2021-01-30 DIAGNOSIS — I69354 Hemiplegia and hemiparesis following cerebral infarction affecting left non-dominant side: Secondary | ICD-10-CM | POA: Insufficient documentation

## 2021-01-30 DIAGNOSIS — R293 Abnormal posture: Secondary | ICD-10-CM | POA: Diagnosis not present

## 2021-01-30 DIAGNOSIS — G8110 Spastic hemiplegia affecting unspecified side: Secondary | ICD-10-CM | POA: Insufficient documentation

## 2021-01-30 DIAGNOSIS — R2681 Unsteadiness on feet: Secondary | ICD-10-CM

## 2021-01-30 DIAGNOSIS — R41840 Attention and concentration deficit: Secondary | ICD-10-CM | POA: Insufficient documentation

## 2021-01-30 DIAGNOSIS — M79602 Pain in left arm: Secondary | ICD-10-CM | POA: Insufficient documentation

## 2021-01-30 DIAGNOSIS — M6281 Muscle weakness (generalized): Secondary | ICD-10-CM

## 2021-01-30 DIAGNOSIS — R208 Other disturbances of skin sensation: Secondary | ICD-10-CM | POA: Insufficient documentation

## 2021-01-30 NOTE — Therapy (Signed)
[facility] [address] [place], [place], [address] Phone: [phone]   Fax:  [phone]  Physical Therapy Treatment  Patient Details  Name: Derek [name] MRN: [id number] Date of Birth: 29-Jan-1976 Referring Provider (PT): Aliene Beams   Encounter Date: 01/30/2021   PT End of Session - 01/30/21 1406    Visit Number 5    Number of Visits 17    Authorization Type BCBS with medicaid secondary    PT Start Time 1403    PT Stop Time 1444    PT Time Calculation (min) 41 min    Equipment Utilized During Treatment Gait belt    Activity Tolerance Patient tolerated treatment well    Behavior During Therapy Flat affect           Past Medical History:  Diagnosis Date  . 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 Alcohol dependence (HCC)   . Back pain   . Marijuana dependence (HCC)   . Tobacco dependence     Past Surgical History:  Procedure Laterality Date  . BUBBLE STUDY  01/10/2020   Procedure: BUBBLE STUDY;  Surgeon: Sande Rives, MD;  Location: [facility] ENDOSCOPY;  Service: Cardiovascular;;  . TEE WITHOUT CARDIOVERSION N/A 01/10/2020   Procedure: TRANSESOPHAGEAL ECHOCARDIOGRAM (TEE);  Surgeon: Sande Rives, MD;  Location: [facility] ENDOSCOPY;  Service: Cardiovascular;  Laterality: N/A;    There were no vitals filed for this visit.   Subjective Assessment - 01/30/21 1407    Subjective Patient reports no new changes/complaints. Some soreness in the shoulder, reports that has been there since Tuesday.    Patient Stated Goals Pt would like to work on improving left leg strength, improve bend in it, to make it easier to walk.    Currently in Pain? Yes    Pain Score 4     Pain Location Shoulder    Pain Orientation Left    Pain Descriptors / Indicators Aching    Pain Type Chronic pain    Pain Onset More than a month ago    Pain Relieving Factors extra strength tylenol                [facility]  Adult PT Treatment/Exercise - 01/30/21 0001      Transfers   Transfers Sit to Stand;Stand to Sit    Sit to Stand 5: Supervision    Stand to Sit 5: Supervision      Ambulation/Gait   Ambulation/Gait Yes    Ambulation/Gait Assistance 5: Supervision    Ambulation/Gait Assistance Details completed ambulation around therapy gym with [facility]. PT providing verbal cues for posture.    Ambulation Distance (Feet) --   clinic distances   Assistive device Large base quad cane   L Foot Up Brace   Gait Pattern Step-through pattern;Decreased hip/knee flexion - left;Decreased dorsiflexion - left    Ambulation Surface Level;Indoor    Stairs Yes    Stairs Assistance 4: Min guard    Stairs Assistance Details (indicate cue type and reason) patient ascending/descending with single rail. Pt descending leading with RLE due to patient feeling more stable comfort this way.    Stair Management Technique One rail Right;Step to pattern    Number of Stairs 8    Height of Stairs 6      Neuro Re-ed    Neuro Re-ed Details  standing in // bars using mirror for feedback: completed forward stepping with LLE with PT providing resistance via red theraband around  pelvis x 15 reps. manual faciliation provided for improved pelvic rotation. Standing with 4" step completed step up and placement of LLE to promote improved hip/knee flexion. PT providing tactile cues at lateral knee to reduce circumduction, completed x 15 reps.      Exercises   Exercises Other Exercises    Other Exercises  PT completed manual stretch to L gastroc 4 x 30 seconds, progressing into range as tolerated. Completed manual hamstring stretch 3 x 30 seconds  on LLE. In hooklying position: completed alternating marching with PT providing manual resistance to LLE to promote strengthening, completed x 15 reps.      Knee/Hip Exercises: Aerobic   Other Aerobic With BLE only completed SciFit on Level 4 x 7 minutes for strengthening and reciprocal movement.  intermittent tactile cues to maintain alignment.              PT Short Term Goals - 01/14/21 1142      PT SHORT TERM GOAL #1   Title Pt will be independent with HEP for strengthening, flexibility and balance.    Baseline Pt reports he is walking around home currently.    Time 4    Period Weeks    Status New    Target Date 02/13/21      PT SHORT TERM GOAL #2   Title Pt will ambulate >300' on level surfaces with quad cane mod I with decreased right trunk lean for improved household and short community distances.    Baseline 200' supervision/CGA with quad cane    Time 4    Period Weeks    Status New    Target Date 02/13/21      PT SHORT TERM GOAL #3   Title Pt will decrease 5 x sit to stand from 39.79 sec to <35 sec for improved balance and functional strength.    Baseline 01/13/21 39.79 sec from w/c with hand    Time 4    Period Weeks    Status New    Target Date 02/13/21             PT Long Term Goals - 01/14/21 1159      PT LONG TERM GOAL #1   Title Pt will be able to walk >10 minutes in therapy for improved activity tolerance and mobility.    Baseline <5 min    Time 8    Period Weeks    Status New    Target Date 03/15/21      PT LONG TERM GOAL #2   Title Patient will demonstrate ability to ambulate > 600 ft w/ LRAD supervision on outdoor/indoor surfaces to demo improved community mobility    Baseline 200' on level supervision/CGA    Time 8    Status New    Target Date 03/15/21      PT LONG TERM GOAL #3   Title Pt will decrease TUG from 31.28 sec to <35 sec for improved balance and decreased fall risk.    Baseline 01/13/21 31.28 sec    Time 8    Period Weeks    Status New    Target Date 03/15/21      PT LONG TERM GOAL #4   Title Pt will increase gait speed from 0.24m/s to >0.59m/s for improved household mobility.    Baseline 01/13/21 0.60m/s    Time 8    Period Weeks    Status New    Target Date 03/15/21      PT LONG TERM  GOAL #5   Title Pt will be  able to negotiate up/down ramp and curb with quad cane CGA.    Baseline not performing    Time 8    Period Weeks    Status New    Target Date 03/15/21                 Plan - 01/30/21 1500    Clinical Impression Statement Continued activities working toward improved hip/knee flexion and strength with activiites. Continued stair training with single rail, PT providinig education for improved safety with ascend/descend stairs. Will continue to progress toward all LTGs.    Personal Factors and Comorbidities Comorbidity 3+;Time since onset of injury/illness/exacerbation    Comorbidities Right MCA CVA with Spastic Hemiparesis 01/07/20, alchohol dependence, back pain, marijuana dependence, tobacco dependence.    Examination-Activity Limitations Transfers;Stairs;Locomotion Level;Stand    Examination-Participation Restrictions Community Activity;Cleaning    Stability/Clinical Decision Making Evolving/Moderate complexity    Rehab Potential Good    PT Frequency 2x / week   plus eval   PT Duration 8 weeks    PT Treatment/Interventions ADLs/Self Care Home Management;DME Instruction;Therapeutic activities;Stair training;Gait training;Therapeutic exercise;Balance training;Neuromuscular re-education;Patient/family education;Orthotic Fit/Training;Manual techniques;Passive range of motion;Vestibular    PT Next Visit Plan Continue Gait training. Continue to work on weight shifting to left more with less right lateral trunk lean to clear leg. Left hip flexor and extensor strengthening.    Consulted and Agree with Plan of Care Patient           Patient will benefit from skilled therapeutic intervention in order to improve the following deficits and impairments:  Abnormal gait,Decreased knowledge of use of DME,Decreased endurance,Decreased mobility,Decreased range of motion,Decreased strength,Impaired flexibility,Impaired tone,Impaired UE functional use,Postural dysfunction  Visit Diagnosis: Muscle  weakness (generalized)  Abnormal posture  Unsteadiness on feet  Other abnormalities of gait and mobility     Problem List Patient Active Problem List   Diagnosis Date Noted  . Neuropathic pain 04/09/2020  . Pain   . Spastic hemiparesis (HCC)   . History of hypertension   . Dyslipidemia   . 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 Leukocytosis 01/12/2020  . Cerebrovascular accident (CVA) due to thrombosis of right middle cerebral artery (HCC)   . Chronic pain syndrome   . Hypokalemia   . Dysphagia, post-stroke   . 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 Tobacco dependence   . Marijuana dependence (HCC)   . Alcohol dependence (HCC)     Tempie Donning, PT, DPT 01/30/2021, 3:05 PM  [facility] [address] [place], [place], [address] Phone: [phone]   Fax:  [phone]  Name: Derek [name] MRN: [id number] Date of Birth: [birth year]

## 2021-01-30 NOTE — Therapy (Signed)
[facility] [address] [place], [place], [address] Phone: [phone]   Fax:  [phone]  Occupational Therapy Treatment  Patient Details  Name: Derek [name] MRN: [id number] Date of Birth: Mar 09, 1976 Referring Provider (OT): Aliene Beams   Encounter Date: 01/30/2021   OT End of Session - 01/30/21 1445    Visit Number 4    Number of Visits 13    Date for OT Re-Evaluation 03/29/21    Authorization Type BCBS and [facility] Medicaid    Authorization Time Period awaiting auth    OT Start Time 1444    OT Stop Time 1528    OT Time Calculation (min) 44 min    Activity Tolerance Other (comment)    Behavior During Therapy Flat affect           Past Medical History:  Diagnosis Date  . 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 Alcohol dependence (HCC)   . Back pain   . Marijuana dependence (HCC)   . Tobacco dependence     Past Surgical History:  Procedure Laterality Date  . BUBBLE STUDY  01/10/2020   Procedure: BUBBLE STUDY;  Surgeon: Sande Rives, MD;  Location: [facility] ENDOSCOPY;  Service: Cardiovascular;;  . TEE WITHOUT CARDIOVERSION N/A 01/10/2020   Procedure: TRANSESOPHAGEAL ECHOCARDIOGRAM (TEE);  Surgeon: Sande Rives, MD;  Location: [facility] ENDOSCOPY;  Service: Cardiovascular;  Laterality: N/A;    There were no vitals filed for this visit.   Subjective Assessment - 01/30/21 1445    Subjective  "my arm is still sore from stretching the other day"    Currently in Pain? Yes    Pain Score 3     Pain Location Shoulder    Pain Orientation Left    Pain Descriptors / Indicators Sore    Pain Type Acute pain    Pain Onset More than a month ago    Pain Frequency Intermittent            Manual Therapy: passive range of motion to LUE elbow, shoulder, wrist and hand.  Significant pain reported with abduction and external rotation. Pt encouraged to sit with arm a little away from body during the  day. Pt with increased ability to be stretched to near end range for LUE elbow extension. Pt continues to demonstrate significant tone in wrist flex and ext and hand at flexion.  Neuromuscular Reeducation: AAROM for elbow flex/ext, sup/pro with trace activation noted at elbow ext possibly. LB/UB dressing: left sock and shoe and foot up brace. Pt was able to doff sock, shoe and brace with min A to don with assistance for holding foot on right leg for donning sock. Pt required max assistance for donning brace and left shoe. Pt doffed button up shirt with supervision and donned with mod A. Pt required assistance for threading LUE a little, orientation of shirt and buttoning shirt. Pt was able to unbutton shirt for doffing.                     OT Short Term Goals - 01/30/21 1529      OT SHORT TERM GOAL #1   Title Patient will complete a self stretching program for left upper extremity    Baseline not currently performing consistently    Time 4    Period Weeks    Status On-going    Target Date 02/27/21      OT SHORT TERM GOAL #2   Title  Patient will dress upper body, pull over short sleeve tshirt with increased time and min assist    Baseline mod- total assist    Time 4    Period Weeks    Status On-going   pt reports doing with supervision, did with min A in clinic     OT SHORT TERM GOAL #3   Title Patient will report pain of less than 4/10 with forward low reaching tasks    Baseline Pain ranges toward 7-8/10    Time 4    Period Weeks    Status On-going      OT SHORT TERM GOAL #4   Title Pt to bathe self with min assist seated    Baseline Patient  actively participates in bathing with max assist    Time 4    Period Weeks    Status On-going      OT SHORT TERM GOAL #5   Title Pt to don/doff a front opening shirt or jacket with mod assist    Baseline max assist    Time 4    Period Weeks    Status On-going   button up shirt with mod A 01/30/21            OT Long  Term Goals - 01/13/21 1543      OT LONG TERM GOAL #1   Title Patient will complete an updated HEP to address movement in LUE    Baseline Inconsistent performance of HEP    Time 6    Period Weeks    Status New    Target Date 03/29/21      OT LONG TERM GOAL #2   Title Patient will transfer into and out of shower with supervision    Baseline min assist    Time 6    Period Weeks    Status New      OT LONG TERM GOAL #3   Title Pt to verbalize understanding with potential A/E needs and task modifications to increase ease with ADLS and simple IADLS    Baseline Unable to verbalize    Time 6    Period Weeks    Status New      OT LONG TERM GOAL #4   Title Pt to be mod I level with bathing and UE dressing and min assist for LE dressing    Baseline mod-dependence    Time 6    Status New      OT LONG TERM GOAL #5   Title xxx                 Plan - 01/30/21 1553    Clinical Impression Statement Pt with increase in ADL participation and independence.    OT Occupational Profile and History Detailed Assessment- Review of Records and additional review of physical, cognitive, psychosocial history related to current functional performance    Occupational performance deficits (Please refer to evaluation for details): ADL's;IADL's;Rest and Sleep    Body Structure / Function / Physical Skills ADL;Coordination;Endurance;GMC;Muscle spasms;UE functional use;Balance;Decreased knowledge of precautions;Sensation;Vision;Pain;IADL;Flexibility;Decreased knowledge of use of DME;Body mechanics;Dexterity;FMC;Proprioception;Strength;Tone;ROM;Mobility;Edema    Cognitive Skills Attention;Emotional;Energy/Drive;Learn;Memory;Sequencing;Safety Awareness;Problem Solve;Perception    Rehab Potential Fair    Clinical Decision Making Several treatment options, min-mod task modification necessary    Comorbidities Affecting Occupational Performance: May have comorbidities impacting occupational performance     Modification or Assistance to Complete Evaluation  Min-Moderate modification of tasks or assist with assess necessary to complete eval    OT Frequency 2x /  week    OT Duration 6 weeks    OT Treatment/Interventions Self-care/ADL training;Aquatic Therapy;Electrical Stimulation;Therapeutic exercise;Visual/perceptual remediation/compensation;Moist Heat;Neuromuscular education;Splinting;Patient/family education;Balance training;Therapeutic activities;Functional Mobility Training;Fluidtherapy;Ultrasound;Cryotherapy;Contrast Bath;DME and/or AE instruction;Manual Therapy;Passive range of motion;Cognitive remediation/compensation    Plan review UB dressing, self-PROM, weight bearing, moist heat on LUE shoulder    OT Home Exercise Plan review prior exercises    Consulted and Agree with Plan of Care Patient           Patient will benefit from skilled therapeutic intervention in order to improve the following deficits and impairments:   Body Structure / Function / Physical Skills: ADL,Coordination,Endurance,GMC,Muscle spasms,UE functional use,Balance,Decreased knowledge of precautions,Sensation,Vision,Pain,IADL,Flexibility,Decreased knowledge of use of DME,Body mechanics,Dexterity,FMC,Proprioception,Strength,Tone,ROM,Mobility,Edema Cognitive Skills: Attention,Emotional,Energy/Drive,Learn,Memory,Sequencing,Safety Awareness,Problem Solve,Perception     Visit Diagnosis: Muscle weakness (generalized)  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 Neuropathic pain 04/09/2020  . Pain   . Spastic hemiparesis (HCC)   . History of hypertension   . Dyslipidemia   . 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 Leukocytosis 01/12/2020  . Cerebrovascular accident (CVA) due to thrombosis of right middle cerebral artery (HCC)   . Chronic pain syndrome   . Hypokalemia   . Dysphagia, post-stroke   . 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 Tobacco dependence   . Marijuana dependence (HCC)   . Alcohol dependence (HCC)     Junious Dresser MOT, OTR/L  01/30/2021, 3:55 PM  [facility] [address] [place], [place], [address] Phone: [phone]   Fax:  [phone]  Name: Derek [name] MRN: [id number] Date of Birth: February 20, 1976

## 2021-02-01 ENCOUNTER — Other Ambulatory Visit: Payer: Self-pay | Admitting: Physical Medicine & Rehabilitation

## 2021-02-01 DIAGNOSIS — I63511 Cerebral infarction due to unspecified occlusion or stenosis of right middle cerebral artery: Secondary | ICD-10-CM

## 2021-02-03 ENCOUNTER — Ambulatory Visit: Payer: BC Managed Care – PPO | Admitting: Occupational Therapy

## 2021-02-03 ENCOUNTER — Other Ambulatory Visit: Payer: Self-pay

## 2021-02-03 ENCOUNTER — Encounter: Payer: Self-pay | Admitting: Occupational Therapy

## 2021-02-03 ENCOUNTER — Ambulatory Visit: Payer: BC Managed Care – PPO

## 2021-02-03 DIAGNOSIS — R2681 Unsteadiness on feet: Secondary | ICD-10-CM | POA: Diagnosis not present

## 2021-02-03 DIAGNOSIS — R208 Other disturbances of skin sensation: Secondary | ICD-10-CM | POA: Diagnosis not present

## 2021-02-03 DIAGNOSIS — R2689 Other abnormalities of gait and mobility: Secondary | ICD-10-CM | POA: Diagnosis not present

## 2021-02-03 DIAGNOSIS — M6281 Muscle weakness (generalized): Secondary | ICD-10-CM

## 2021-02-03 DIAGNOSIS — G8110 Spastic hemiplegia affecting unspecified side: Secondary | ICD-10-CM | POA: Diagnosis not present

## 2021-02-03 DIAGNOSIS — I69354 Hemiplegia and hemiparesis following cerebral infarction affecting left non-dominant side: Secondary | ICD-10-CM | POA: Diagnosis not present

## 2021-02-03 DIAGNOSIS — R41840 Attention and concentration deficit: Secondary | ICD-10-CM | POA: Diagnosis not present

## 2021-02-03 DIAGNOSIS — R293 Abnormal posture: Secondary | ICD-10-CM | POA: Diagnosis not present

## 2021-02-03 DIAGNOSIS — M79602 Pain in left arm: Secondary | ICD-10-CM | POA: Diagnosis not present

## 2021-02-03 NOTE — Therapy (Signed)
[facility] [address] [place], [place], [address] Phone: [phone]   Fax:  [phone]  Occupational Therapy Treatment  Patient Details  Name: Derek [name] MRN: [id number] Date of Birth: 1976/03/17 Referring Provider (OT): Aliene Beams   Encounter Date: 02/03/2021   OT End of Session - 02/03/21 1533    Visit Number 5    Number of Visits 13    Date for OT Re-Evaluation 03/29/21    Authorization Type BCBS and [facility] Medicaid    Authorization Time Period awaiting auth    OT Start Time 1532    OT Stop Time 1615    OT Time Calculation (min) 43 min    Activity Tolerance Other (comment)    Behavior During Therapy Flat affect           Past Medical History:  Diagnosis Date  . 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 Alcohol dependence (HCC)   . Back pain   . Marijuana dependence (HCC)   . Tobacco dependence     Past Surgical History:  Procedure Laterality Date  . BUBBLE STUDY  01/10/2020   Procedure: BUBBLE STUDY;  Surgeon: Sande Rives, MD;  Location: [facility] ENDOSCOPY;  Service: Cardiovascular;;  . TEE WITHOUT CARDIOVERSION N/A 01/10/2020   Procedure: TRANSESOPHAGEAL ECHOCARDIOGRAM (TEE);  Surgeon: Sande Rives, MD;  Location: [facility] ENDOSCOPY;  Service: Cardiovascular;  Laterality: N/A;    There were no vitals filed for this visit.   Subjective Assessment - 02/03/21 1533    Subjective  About a 3    Currently in Pain? Yes    Pain Score 3     Pain Location Shoulder    Pain Orientation Left    Pain Descriptors / Indicators Sore    Pain Type Chronic pain    Pain Onset More than a month ago    Pain Frequency Constant           TREATMENT:  NMR: with LUE.working on low level reaching with some active movement noted with compensatory movements overall.   Manual Therapy: PROM and prolonged stretch for LUE shoulder in external rotation and elbow extension and at hand for  extension. Pt with increased range of motion and decrease in tone throughout session with prolonged stretch to inhibit tone.   ADLs: doffed and donned long sleeve shirt. Pt doffed shirt with supervision. Pt required mod A For donning long sleeve shirt but is able to don short sleeve shirt with supervision. Pt simulated bathing at edge of mat with min A for bathing RUE and SBA for stability for standing for washing buttocks and peroneal area. Pt reports doing tub transfers with min A once a week but sponge bathes at edge of bed the other days.                         OT Short Term Goals - 02/03/21 1540      OT SHORT TERM GOAL #1   Title Patient will complete a self stretching program for left upper extremity    Baseline not currently performing consistently    Time 4    Period Weeks    Status On-going    Target Date 02/27/21      OT SHORT TERM GOAL #2   Title Patient will dress upper body, pull over short sleeve tshirt with increased time and min assist    Baseline mod- total assist    Time 4  Period Weeks    Status Achieved   pt reports doing with supervision, did with min A in clinic     OT SHORT TERM GOAL #3   Title Patient will report pain of less than 4/10 with forward low reaching tasks    Baseline Pain ranges toward 7-8/10    Time 4    Period Weeks    Status On-going   no pain - low reaching with compensatory movements     OT SHORT TERM GOAL #4   Title Pt to bathe self with min assist seated    Baseline Patient  actively participates in bathing with max assist    Time 4    Period Weeks    Status On-going   simulated in gym - min A     OT SHORT TERM GOAL #5   Title Pt to don/doff a front opening shirt or jacket with mod assist    Baseline max assist    Time 4    Period Weeks    Status Achieved   button up shirt with mod A 01/30/21            OT Long Term Goals - 02/03/21 1550      OT LONG TERM GOAL #1   Title Patient will complete an updated  HEP to address movement in LUE    Baseline Inconsistent performance of HEP    Time 6    Period Weeks    Status On-going      OT LONG TERM GOAL #2   Title Patient will transfer into and out of shower with supervision    Baseline min assist    Time 6    Period Weeks    Status On-going      OT LONG TERM GOAL #3   Title Pt to verbalize understanding with potential A/E needs and task modifications to increase ease with ADLS and simple IADLS    Baseline Unable to verbalize    Time 6    Period Weeks    Status On-going      OT LONG TERM GOAL #4   Title Pt to be mod I level with bathing and UE dressing and min assist for LE dressing    Baseline mod-dependence    Time 6    Status On-going      OT LONG TERM GOAL #5   Title xxx                 Plan - 02/03/21 1624    Clinical Impression Statement Pt with good participation in therapy session and in ADLs.    OT Occupational Profile and History Detailed Assessment- Review of Records and additional review of physical, cognitive, psychosocial history related to current functional performance    Occupational performance deficits (Please refer to evaluation for details): ADL's;IADL's;Rest and Sleep    Body Structure / Function / Physical Skills ADL;Coordination;Endurance;GMC;Muscle spasms;UE functional use;Balance;Decreased knowledge of precautions;Sensation;Vision;Pain;IADL;Flexibility;Decreased knowledge of use of DME;Body mechanics;Dexterity;FMC;Proprioception;Strength;Tone;ROM;Mobility;Edema    Cognitive Skills Attention;Emotional;Energy/Drive;Learn;Memory;Sequencing;Safety Awareness;Problem Solve;Perception    Rehab Potential Fair    Clinical Decision Making Several treatment options, min-mod task modification necessary    Comorbidities Affecting Occupational Performance: May have comorbidities impacting occupational performance    Modification or Assistance to Complete Evaluation  Min-Moderate modification of tasks or assist with  assess necessary to complete eval    OT Frequency 2x / week    OT Duration 6 weeks    OT Treatment/Interventions Self-care/ADL training;Aquatic Therapy;Electrical Stimulation;Therapeutic exercise;Visual/perceptual remediation/compensation;Moist  Heat;Neuromuscular education;Splinting;Patient/family education;Balance training;Therapeutic activities;Functional Mobility Training;Fluidtherapy;Ultrasound;Cryotherapy;Contrast Bath;DME and/or AE instruction;Manual Therapy;Passive range of motion;Cognitive remediation/compensation    Plan review UB dressing, self-PROM, weight bearing, moist heat on LUE shoulder    OT Home Exercise Plan review prior exercises    Consulted and Agree with Plan of Care Patient           Patient will benefit from skilled therapeutic intervention in order to improve the following deficits and impairments:   Body Structure / Function / Physical Skills: ADL,Coordination,Endurance,GMC,Muscle spasms,UE functional use,Balance,Decreased knowledge of precautions,Sensation,Vision,Pain,IADL,Flexibility,Decreased knowledge of use of DME,Body mechanics,Dexterity,FMC,Proprioception,Strength,Tone,ROM,Mobility,Edema Cognitive Skills: Attention,Emotional,Energy/Drive,Learn,Memory,Sequencing,Safety Awareness,Problem Solve,Perception     Visit Diagnosis: Muscle weakness (generalized)  Unsteadiness on feet  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 Neuropathic pain 04/09/2020  . Pain   . Spastic hemiparesis (HCC)   . History of hypertension   . Dyslipidemia   . 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 Leukocytosis 01/12/2020  . Cerebrovascular accident (CVA) due to thrombosis of right middle cerebral artery (HCC)   . Chronic pain  syndrome   . Hypokalemia   . Dysphagia, post-stroke   . 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 Tobacco dependence   . Marijuana dependence (HCC)   . Alcohol dependence (HCC)     Junious Dresser MOT, OTR/L  02/03/2021, 4:25 PM  [facility] [address] [place], [place], [address] Phone: [phone]   Fax:  [phone]  Name: Derek [name] MRN: [id number] Date of Birth: 04-24-1976

## 2021-02-03 NOTE — Patient Instructions (Signed)
Access Code: [email or web address] URL: [email or web address] Date: 02/03/2021 Prepared by: Elmer Bales  Exercises Supine Bridge - 2 x daily - 5 x weekly - 2 sets - 8 reps Bent Knee Fallouts - 2 x daily - 5 x weekly - 2 sets - 8 reps Supine Heel Slide - 2 x daily - 5 x weekly - 2 sets - 8 reps Seated Heel Slide - 1 x daily - 5 x weekly - 2 sets - 8 reps Sit to Stand with Armchair - 1 x daily - 5 x weekly - 3 sets - 5 reps Side to Side Weight Shift with Counter Support - 2 x daily - 7 x weekly - 2 sets - 10 reps Supine March with Resistance Band - 1 x daily - 7 x weekly - 3 sets - 10 reps Supine Hamstring Stretch with Caregiver - 3 x daily - 7 x weekly - 1 sets - 4 reps - 30 sec hold Hip flexion/extension with resistance - 3 x daily - 7 x weekly - 1 sets - 4 reps - 30 sec hold Supine Ankle Dorsiflexion Stretch with Caregiver - 3 x daily - 7 x weekly - 1 sets - 4 reps - 30 sec hold Standing Gastroc Stretch at Counter - 3 x daily - 7 x weekly - 1 sets - 4 reps - 30 sec hold

## 2021-02-04 NOTE — Therapy (Signed)
[facility] [address] [place], [place], [address] Phone: [phone]   Fax:  [phone]  Physical Therapy Treatment  Patient Details  Name: Derek [name] MRN: [id number] Date of Birth: [birth year] Referring Provider (PT): Aliene Beams   Encounter Date: 02/03/2021   PT End of Session - 02/03/21 1451    Visit Number 6    Number of Visits 17    Authorization Type BCBS with medicaid secondary    PT Start Time 1448    PT Stop Time 1529    PT Time Calculation (min) 41 min    Equipment Utilized During Treatment Gait belt    Activity Tolerance Patient tolerated treatment well    Behavior During Therapy Flat affect           Past Medical History:  Diagnosis Date  . 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 Alcohol dependence (HCC)   . Back pain   . Marijuana dependence (HCC)   . Tobacco dependence     Past Surgical History:  Procedure Laterality Date  . BUBBLE STUDY  01/10/2020   Procedure: BUBBLE STUDY;  Surgeon: Sande Rives, MD;  Location: [facility] ENDOSCOPY;  Service: Cardiovascular;;  . TEE WITHOUT CARDIOVERSION N/A 01/10/2020   Procedure: TRANSESOPHAGEAL ECHOCARDIOGRAM (TEE);  Surgeon: Sande Rives, MD;  Location: [facility] ENDOSCOPY;  Service: Cardiovascular;  Laterality: N/A;    There were no vitals filed for this visit.   Subjective Assessment - 02/03/21 1452    Subjective Patient reports doing ok. Feels walking is still the same.    Patient Stated Goals Pt would like to work on improving left leg strength, improve bend in it, to make it easier to walk.    Currently in Pain? Yes    Pain Score --   "minimal"   Pain Location Shoulder    Pain Orientation Left    Pain Descriptors / Indicators Sore    Pain Type Chronic pain    Pain Onset More than a month ago    Pain Frequency Intermittent                             [facility] Adult PT Treatment/Exercise - 02/03/21  1454      Ambulation/Gait   Ambulation/Gait Yes    Ambulation/Gait Assistance 5: Supervision    Ambulation/Gait Assistance Details Tactile cues to right lateral trunk and left pelvis for upright posture and increasing left hip flexion.    Ambulation Distance (Feet) 545 Feet    Assistive device Large base quad cane   left foot up brace   Gait Pattern Step-through pattern;Decreased hip/knee flexion - left    Ambulation Surface Level;Indoor      Exercises   Exercises Other Exercises    Other Exercises  Supine hamstring, hip/knee flexion, gastroc stretch to left by PT 30 sec x 3 each. Wife observing and PT instructed how she could perform at home. Left hip flexion on red physioball x 10 with PT providing manual resistance each way. Verbal cues to keep hip in neutral. Hooklying march with red theraband 10 x 2 left. Sidelying left clamshell x 10 with tactile cues to prevent rolling through minimal range. Standing at counter: instructed in left gastroc stretch but PT had to assist to keep left foot straight and heel down.                  PT Education - 02/04/21  1324    Education Details Added hooklying march and stretches to HEP. Wife observed and PT demonstrated how she could best assist.    Person(s) Educated Patient;Spouse    Methods Explanation;Demonstration;Handout    Comprehension Verbalized understanding;Returned demonstration            PT Short Term Goals - 01/14/21 1142      PT SHORT TERM GOAL #1   Title Pt will be independent with HEP for strengthening, flexibility and balance.    Baseline Pt reports he is walking around home currently.    Time 4    Period Weeks    Status New    Target Date 02/13/21      PT SHORT TERM GOAL #2   Title Pt will ambulate >300' on level surfaces with quad cane mod I with decreased right trunk lean for improved household and short community distances.    Baseline 200' supervision/CGA with quad cane    Time 4    Period Weeks    Status  New    Target Date 02/13/21      PT SHORT TERM GOAL #3   Title Pt will decrease 5 x sit to stand from 39.79 sec to <35 sec for improved balance and functional strength.    Baseline 01/13/21 39.79 sec from w/c with hand    Time 4    Period Weeks    Status New    Target Date 02/13/21             PT Long Term Goals - 01/14/21 1159      PT LONG TERM GOAL #1   Title Pt will be able to walk >10 minutes in therapy for improved activity tolerance and mobility.    Baseline <5 min    Time 8    Period Weeks    Status New    Target Date 03/15/21      PT LONG TERM GOAL #2   Title Patient will demonstrate ability to ambulate > 600 ft w/ LRAD supervision on outdoor/indoor surfaces to demo improved community mobility    Baseline 200' on level supervision/CGA    Time 8    Status New    Target Date 03/15/21      PT LONG TERM GOAL #3   Title Pt will decrease TUG from 31.28 sec to <35 sec for improved balance and decreased fall risk.    Baseline 01/13/21 31.28 sec    Time 8    Period Weeks    Status New    Target Date 03/15/21      PT LONG TERM GOAL #4   Title Pt will increase gait speed from 0.75m/s to >0.69m/s for improved household mobility.    Baseline 01/13/21 0.59m/s    Time 8    Period Weeks    Status New    Target Date 03/15/21      PT LONG TERM GOAL #5   Title Pt will be able to negotiate up/down ramp and curb with quad cane CGA.    Baseline not performing    Time 8    Period Weeks    Status New    Target Date 03/15/21                 Plan - 02/04/21 0811    Clinical Impression Statement Pt reported feeling like left leg moved better after stretching. Wife was present today so PT was able to demonstrate stretches she could help her husband with.  Personal Factors and Comorbidities Comorbidity 3+;Time since onset of injury/illness/exacerbation    Comorbidities Right MCA CVA with Spastic Hemiparesis 01/07/20, alchohol dependence, back pain, marijuana dependence,  tobacco dependence.    Examination-Activity Limitations Transfers;Stairs;Locomotion Level;Stand    Examination-Participation Restrictions Community Activity;Cleaning    Stability/Clinical Decision Making Evolving/Moderate complexity    Rehab Potential Good    PT Frequency 2x / week   plus eval   PT Duration 8 weeks    PT Treatment/Interventions ADLs/Self Care Home Management;DME Instruction;Therapeutic activities;Stair training;Gait training;Therapeutic exercise;Balance training;Neuromuscular re-education;Patient/family education;Orthotic Fit/Training;Manual techniques;Passive range of motion;Vestibular    PT Next Visit Plan Did pt get to work on stretches with wife? Continue Gait training. Continue to work on weight shifting to left more with less right lateral trunk lean to clear leg. Left hip flexor and extensor strengthening.    Consulted and Agree with Plan of Care Patient           Patient will benefit from skilled therapeutic intervention in order to improve the following deficits and impairments:  Abnormal gait,Decreased knowledge of use of DME,Decreased endurance,Decreased mobility,Decreased range of motion,Decreased strength,Impaired flexibility,Impaired tone,Impaired UE functional use,Postural dysfunction  Visit Diagnosis: Other abnormalities of gait and mobility  Muscle weakness (generalized)  Spastic hemiplegia of left nondominant 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 [facility])     Problem List Patient Active Problem List   Diagnosis Date Noted  . Neuropathic pain 04/09/2020  . Pain   . Spastic hemiparesis (HCC)   . History of hypertension   . Dyslipidemia   . 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 Leukocytosis 01/12/2020  . Cerebrovascular accident (CVA) due to thrombosis of right middle cerebral artery (HCC)   . Chronic pain syndrome   . Hypokalemia   . Dysphagia, post-stroke   . 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 Tobacco  dependence   . Marijuana dependence (HCC)   . Alcohol dependence (HCC)     Ronn Melena, PT, DPT, NCS 02/04/2021, 8:14 AM  [facility] [address] [place], [place], [address] Phone: [phone]   Fax:  [phone]  Name: DAY DEERY MRN: [id number] Date of Birth: May 29, 1976

## 2021-02-05 ENCOUNTER — Ambulatory Visit: Payer: BC Managed Care – PPO

## 2021-02-05 ENCOUNTER — Ambulatory Visit: Payer: BC Managed Care – PPO | Admitting: Occupational Therapy

## 2021-02-05 ENCOUNTER — Encounter: Payer: Self-pay | Admitting: Occupational Therapy

## 2021-02-05 ENCOUNTER — Other Ambulatory Visit: Payer: Self-pay

## 2021-02-05 DIAGNOSIS — R2689 Other abnormalities of gait and mobility: Secondary | ICD-10-CM

## 2021-02-05 DIAGNOSIS — R2681 Unsteadiness on feet: Secondary | ICD-10-CM

## 2021-02-05 DIAGNOSIS — M79602 Pain in left arm: Secondary | ICD-10-CM

## 2021-02-05 DIAGNOSIS — R41840 Attention and concentration deficit: Secondary | ICD-10-CM

## 2021-02-05 DIAGNOSIS — G8110 Spastic hemiplegia affecting unspecified side: Secondary | ICD-10-CM

## 2021-02-05 DIAGNOSIS — I69354 Hemiplegia and hemiparesis following cerebral infarction affecting left non-dominant side: Secondary | ICD-10-CM

## 2021-02-05 DIAGNOSIS — M6281 Muscle weakness (generalized): Secondary | ICD-10-CM | POA: Diagnosis not present

## 2021-02-05 DIAGNOSIS — R208 Other disturbances of skin sensation: Secondary | ICD-10-CM | POA: Diagnosis not present

## 2021-02-05 DIAGNOSIS — R293 Abnormal posture: Secondary | ICD-10-CM | POA: Diagnosis not present

## 2021-02-05 NOTE — Therapy (Signed)
[facility] [address] [place], [place], [address] Phone: [phone]   Fax:  [phone]  Occupational Therapy Treatment  Patient Details  Name: Derek [name] MRN: [id number] Date of Birth: [birth year] Referring Provider (OT): Aliene Beams   Encounter Date: 02/05/2021   OT End of Session - 02/05/21 1537    Visit Number 6    Number of Visits 13    Date for OT Re-Evaluation 03/29/21    Authorization Type BCBS and [facility] Medicaid    Authorization Time Period awaiting auth    OT Start Time 1531    OT Stop Time 1615    OT Time Calculation (min) 44 min    Activity Tolerance Other (comment)    Behavior During Therapy Flat affect           Past Medical History:  Diagnosis Date  . 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 Alcohol dependence (HCC)   . Back pain   . Marijuana dependence (HCC)   . Tobacco dependence     Past Surgical History:  Procedure Laterality Date  . BUBBLE STUDY  01/10/2020   Procedure: BUBBLE STUDY;  Surgeon: Sande Rives, MD;  Location: [facility] ENDOSCOPY;  Service: Cardiovascular;;  . TEE WITHOUT CARDIOVERSION N/A 01/10/2020   Procedure: TRANSESOPHAGEAL ECHOCARDIOGRAM (TEE);  Surgeon: Sande Rives, MD;  Location: [facility] ENDOSCOPY;  Service: Cardiovascular;  Laterality: N/A;    There were no vitals filed for this visit.   Subjective Assessment - 02/05/21 1537    Subjective  Pt reports soreness in his LUE shoulder.    Currently in Pain? Yes    Pain Score 3     Pain Location Shoulder    Pain Orientation Left    Pain Descriptors / Indicators Aching;Sore    Pain Type Chronic pain    Pain Onset More than a month ago    Pain Frequency Constant              TREATMENT:  Foot up brace: worked on problem solving with patient to place brace onto L shoes that had different laces. Pt required mod A for doffing an donning L shoe.  Weight bearing in LUE for inhibition of  tone and facilitation of muscle activation while reaching with RUE to obtain and place cones. Pt required max assistance for maintaining hand position on mat for weight bearing. Pt with weight bearing into LUE forearm and pushing up into sitting with LUE with min A x 10.   Sidelying on Left: with weight bearing into shoulder and performing AAROM of elbow extension and flexion. Pt with flexion>extension. Pt did not report any pain.  Manual Therapy of PROM on LUE hand inhibiting tone and increasing range of motion. Pt's tone decreased today but still significant.  Moist Heat x 10 on LUE shoulder while supine on mat and performing manual therapy.                    OT Short Term Goals - 02/03/21 1540      OT SHORT TERM GOAL #1   Title Patient will complete a self stretching program for left upper extremity    Baseline not currently performing consistently    Time 4    Period Weeks    Status On-going    Target Date 02/27/21      OT SHORT TERM GOAL #2   Title Patient will dress upper body, pull over short sleeve tshirt with increased  time and min assist    Baseline mod- total assist    Time 4    Period Weeks    Status Achieved   pt reports doing with supervision, did with min A in clinic     OT SHORT TERM GOAL #3   Title Patient will report pain of less than 4/10 with forward low reaching tasks    Baseline Pain ranges toward 7-8/10    Time 4    Period Weeks    Status On-going   no pain - low reaching with compensatory movements     OT SHORT TERM GOAL #4   Title Pt to bathe self with min assist seated    Baseline Patient  actively participates in bathing with max assist    Time 4    Period Weeks    Status On-going   simulated in gym - min A     OT SHORT TERM GOAL #5   Title Pt to don/doff a front opening shirt or jacket with mod assist    Baseline max assist    Time 4    Period Weeks    Status Achieved   button up shirt with mod A 01/30/21            OT  Long Term Goals - 02/03/21 1550      OT LONG TERM GOAL #1   Title Patient will complete an updated HEP to address movement in LUE    Baseline Inconsistent performance of HEP    Time 6    Period Weeks    Status On-going      OT LONG TERM GOAL #2   Title Patient will transfer into and out of shower with supervision    Baseline min assist    Time 6    Period Weeks    Status On-going      OT LONG TERM GOAL #3   Title Pt to verbalize understanding with potential A/E needs and task modifications to increase ease with ADLS and simple IADLS    Baseline Unable to verbalize    Time 6    Period Weeks    Status On-going      OT LONG TERM GOAL #4   Title Pt to be mod I level with bathing and UE dressing and min assist for LE dressing    Baseline mod-dependence    Time 6    Status On-going      OT LONG TERM GOAL #5   Title xxx                 Plan - 02/05/21 1645    Clinical Impression Statement Pt actively participated in therapy session    OT Occupational Profile and History Detailed Assessment- Review of Records and additional review of physical, cognitive, psychosocial history related to current functional performance    Occupational performance deficits (Please refer to evaluation for details): ADL's;IADL's;Rest and Sleep    Body Structure / Function / Physical Skills ADL;Coordination;Endurance;GMC;Muscle spasms;UE functional use;Balance;Decreased knowledge of precautions;Sensation;Vision;Pain;IADL;Flexibility;Decreased knowledge of use of DME;Body mechanics;Dexterity;FMC;Proprioception;Strength;Tone;ROM;Mobility;Edema    Cognitive Skills Attention;Emotional;Energy/Drive;Learn;Memory;Sequencing;Safety Awareness;Problem Solve;Perception    Rehab Potential Fair    Clinical Decision Making Several treatment options, min-mod task modification necessary    Comorbidities Affecting Occupational Performance: May have comorbidities impacting occupational performance    Modification or  Assistance to Complete Evaluation  Min-Moderate modification of tasks or assist with assess necessary to complete eval    OT Frequency 2x / week  OT Duration 6 weeks    OT Treatment/Interventions Self-care/ADL training;Aquatic Therapy;Electrical Stimulation;Therapeutic exercise;Visual/perceptual remediation/compensation;Moist Heat;Neuromuscular education;Splinting;Patient/family education;Balance training;Therapeutic activities;Functional Mobility Training;Fluidtherapy;Ultrasound;Cryotherapy;Contrast Bath;DME and/or AE instruction;Manual Therapy;Passive range of motion;Cognitive remediation/compensation    Plan review UB dressing, self-PROM, weight bearing, moist heat on LUE shoulder - pt is sch to get botox on 4/13 in UE    OT Home Exercise Plan review prior exercises    Consulted and Agree with Plan of Care Patient           Patient will benefit from skilled therapeutic intervention in order to improve the following deficits and impairments:   Body Structure / Function / Physical Skills: ADL,Coordination,Endurance,GMC,Muscle spasms,UE functional use,Balance,Decreased knowledge of precautions,Sensation,Vision,Pain,IADL,Flexibility,Decreased knowledge of use of DME,Body mechanics,Dexterity,FMC,Proprioception,Strength,Tone,ROM,Mobility,Edema Cognitive Skills: Attention,Emotional,Energy/Drive,Learn,Memory,Sequencing,Safety Awareness,Problem Solve,Perception     Visit Diagnosis: Spastic hemiplegia of left nondominant 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 (HCC)  Unsteadiness on feet  Spastic hemiplegia affecting nondominant side (HCC)  Pain in left arm  Attention and concentration deficit    Problem List Patient Active Problem List   Diagnosis Date Noted  . Neuropathic pain 04/09/2020  . Pain   . Spastic hemiparesis (HCC)   . History of hypertension   . Dyslipidemia   . 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 Leukocytosis 01/12/2020  . Cerebrovascular  accident (CVA) due to thrombosis of right middle cerebral artery (HCC)   . Chronic pain syndrome   . Hypokalemia   . Dysphagia, post-stroke   . 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 Tobacco dependence   . Marijuana dependence (HCC)   . Alcohol dependence (HCC)     Junious Dresser MOT, OTR/L  02/05/2021, 6:19 PM  [facility] [address] [place], [place], [address] Phone: [phone]   Fax:  [phone]  Name: SHAURYA RAWDON MRN: [id number] Date of Birth: 1976-06-16

## 2021-02-05 NOTE — Therapy (Signed)
[facility] [address] [place], [place], [address] Phone: [phone]   Fax:  [phone]  Physical Therapy Treatment  Patient Details  Name: Derek [name] MRN: [id number] Date of Birth: 04-23-1976 Referring Provider (PT): Aliene Beams   Encounter Date: 02/05/2021   PT End of Session - 02/05/21 1451    Visit Number 7    Number of Visits 17    Authorization Type BCBS with medicaid secondary    PT Start Time 1447    PT Stop Time 1528    PT Time Calculation (min) 41 min    Equipment Utilized During Treatment Gait belt    Activity Tolerance Patient tolerated treatment well    Behavior During Therapy Flat affect           Past Medical History:  Diagnosis Date  . 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 Alcohol dependence (HCC)   . Back pain   . Marijuana dependence (HCC)   . Tobacco dependence     Past Surgical History:  Procedure Laterality Date  . BUBBLE STUDY  01/10/2020   Procedure: BUBBLE STUDY;  Surgeon: Sande Rives, MD;  Location: [facility] ENDOSCOPY;  Service: Cardiovascular;;  . TEE WITHOUT CARDIOVERSION N/A 01/10/2020   Procedure: TRANSESOPHAGEAL ECHOCARDIOGRAM (TEE);  Surgeon: Sande Rives, MD;  Location: [facility] ENDOSCOPY;  Service: Cardiovascular;  Laterality: N/A;    There were no vitals filed for this visit.   Subjective Assessment - 02/05/21 1450    Subjective Patient reports that havent tried the stretches. Brought in the new shoes to try with brace.    Patient Stated Goals Pt would like to work on improving left leg strength, improve bend in it, to make it easier to walk.    Currently in Pain? Yes    Pain Score 3     Pain Location Shoulder    Pain Orientation Left    Pain Descriptors / Indicators Sore;Aching    Pain Type Chronic pain    Pain Onset More than a month ago             [facility] Adult PT Treatment/Exercise - 02/05/21 0001      Ambulation/Gait    Ambulation/Gait Yes    Ambulation/Gait Assistance 5: Supervision    Ambulation/Gait Assistance Details Completd ambulation 200 x 2 with various shoes. Completed initial gait with shoes with foot up brace and tennis x 200 ft. Then changed into new pair of tennis shoes. Unable to add brace to shoe due to slips on and not having tiable shoestrings. No significant difference noted with brace donned. PT educating on potential of [employer] added new toe cap to shoe.    Ambulation Distance (Feet) 200 Feet   x 2   Assistive device Large base quad cane    Gait Pattern Step-through pattern;Decreased hip/knee flexion - left    Ambulation Surface Level;Indoor    Gait Comments PT educating patient to reduce use of w/c and began to walk in/out of sessions. Paitent reporting using w/c more frequently due to comfort  and support for LUE.      Neuro Re-ed    Neuro Re-ed Details  On rockerboard in // bars: with lateral positioned completed static standing x 1 minute, then progressed to addition of head turns x 10 reps each direction. then compelted lateral weight shift 2 x 15 reps, PT providing faciliation for improved weight shift and posture.with board positioned A/P completed static standing z 1 minute, then  horiz/vert head turns x 10 reps each. increased challenge noted with balance on rockerboard. With patient supine: completed D1 PNF to LLE x 10 reps passively working toward reductions in tone and improved ROM.      Exercises   Exercises Other Exercises    Other Exercises  completed supine heel slide with pillowcase placed under LLE x 10 reps, with PT providing tactile cues/verbal cues for proper completion.      Knee/Hip Exercises: Stretches   Gastroc Stretch Left;3 reps;30 seconds    Gastroc Stretch Limitations PT completed manual stretch to L gastroc x 30 seconds, progressing to patient tolerable range.    Other Knee/Hip Stretches completed single knee to chest stretch with LLE, 3 x 30 seconds progressing  into range as tolerated.              PT Short Term Goals - 01/14/21 1142      PT SHORT TERM GOAL #1   Title Pt will be independent with HEP for strengthening, flexibility and balance.    Baseline Pt reports he is walking around home currently.    Time 4    Period Weeks    Status New    Target Date 02/13/21      PT SHORT TERM GOAL #2   Title Pt will ambulate >300' on level surfaces with quad cane mod I with decreased right trunk lean for improved household and short community distances.    Baseline 200' supervision/CGA with quad cane    Time 4    Period Weeks    Status New    Target Date 02/13/21      PT SHORT TERM GOAL #3   Title Pt will decrease 5 x sit to stand from 39.79 sec to <35 sec for improved balance and functional strength.    Baseline 01/13/21 39.79 sec from w/c with hand    Time 4    Period Weeks    Status New    Target Date 02/13/21             PT Long Term Goals - 01/14/21 1159      PT LONG TERM GOAL #1   Title Pt will be able to walk >10 minutes in therapy for improved activity tolerance and mobility.    Baseline <5 min    Time 8    Period Weeks    Status New    Target Date 03/15/21      PT LONG TERM GOAL #2   Title Patient will demonstrate ability to ambulate > 600 ft w/ LRAD supervision on outdoor/indoor surfaces to demo improved community mobility    Baseline 200' on level supervision/CGA    Time 8    Status New    Target Date 03/15/21      PT LONG TERM GOAL #3   Title Pt will decrease TUG from 31.28 sec to <35 sec for improved balance and decreased fall risk.    Baseline 01/13/21 31.28 sec    Time 8    Period Weeks    Status New    Target Date 03/15/21      PT LONG TERM GOAL #4   Title Pt will increase gait speed from 0.92m/s to >0.65m/s for improved household mobility.    Baseline 01/13/21 0.66m/s    Time 8    Period Weeks    Status New    Target Date 03/15/21      PT LONG TERM GOAL #5   Title Pt will be  able to negotiate  up/down ramp and curb with quad cane CGA.    Baseline not performing    Time 8    Period Weeks    Status New    Target Date 03/15/21                 Plan - 02/05/21 1622    Clinical Impression Statement Patient continues to report feelign better after stretching, but continues to report inability to complete at home. Assesed gait with new tennis shoes, unable to add brace to it due to no shoestrings. Continued NMR focused on weight shift and improved posture. Will continue to progress toward all LTGs.    Personal Factors and Comorbidities Comorbidity 3+;Time since onset of injury/illness/exacerbation    Comorbidities Right MCA CVA with Spastic Hemiparesis 01/07/20, alchohol dependence, back pain, marijuana dependence, tobacco dependence.    Examination-Activity Limitations Transfers;Stairs;Locomotion Level;Stand    Examination-Participation Restrictions Community Activity;Cleaning    Stability/Clinical Decision Making Evolving/Moderate complexity    Rehab Potential Good    PT Frequency 2x / week   plus eval   PT Duration 8 weeks    PT Treatment/Interventions ADLs/Self Care Home Management;DME Instruction;Therapeutic activities;Stair training;Gait training;Therapeutic exercise;Balance training;Neuromuscular re-education;Patient/family education;Orthotic Fit/Training;Manual techniques;Passive range of motion;Vestibular    PT Next Visit Plan Has not worked on stretches with wife.  Continue Gait training. Continue to work on weight shifting to left more with less right lateral trunk lean to clear leg. Left hip flexor and extensor strengthening.    Consulted and Agree with Plan of Care Patient           Patient will benefit from skilled therapeutic intervention in order to improve the following deficits and impairments:  Abnormal gait,Decreased knowledge of use of DME,Decreased endurance,Decreased mobility,Decreased range of motion,Decreased strength,Impaired flexibility,Impaired  tone,Impaired UE functional use,Postural dysfunction  Visit Diagnosis: Other abnormalities of gait and mobility  Muscle weakness (generalized)  Muscle weakness  Unsteadiness on feet     Problem List Patient Active Problem List   Diagnosis Date Noted  . Neuropathic pain 04/09/2020  . Pain   . Spastic hemiparesis (HCC)   . History of hypertension   . Dyslipidemia   . 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 Leukocytosis 01/12/2020  . Cerebrovascular accident (CVA) due to thrombosis of right middle cerebral artery (HCC)   . Chronic pain syndrome   . Hypokalemia   . Dysphagia, post-stroke   . 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 Tobacco dependence   . Marijuana dependence (HCC)   . Alcohol dependence (HCC)     Tempie Donning, PT, DPT 02/05/2021, 5:03 PM  [place] [facility] [address] [place], [place], [address] Phone: [phone]   Fax:  [phone]  Name: DOMINQUE MARLIN MRN: [id number] Date of Birth: Mar 24, 1976

## 2021-02-10 ENCOUNTER — Ambulatory Visit: Payer: BC Managed Care – PPO | Admitting: Occupational Therapy

## 2021-02-10 ENCOUNTER — Ambulatory Visit: Payer: BC Managed Care – PPO

## 2021-02-11 ENCOUNTER — Encounter
Payer: BC Managed Care – PPO | Attending: Physical Medicine & Rehabilitation | Admitting: Physical Medicine & Rehabilitation

## 2021-02-11 ENCOUNTER — Encounter: Payer: Self-pay | Admitting: Physical Medicine & Rehabilitation

## 2021-02-11 ENCOUNTER — Other Ambulatory Visit: Payer: Self-pay

## 2021-02-11 VITALS — BP 125/83 | HR 75 | Temp 98.6°F | Ht 71.0 in | Wt 238.0 lb

## 2021-02-11 DIAGNOSIS — G8110 Spastic hemiplegia affecting unspecified side: Secondary | ICD-10-CM | POA: Diagnosis not present

## 2021-02-11 NOTE — Progress Notes (Signed)
Botox Injection for spasticity of upper extremity using needle EMG guidance Indication: Spastic hemiparesis (HCC)  G81.10 Dilution: 100 Units/ml        Total Units Injected: 400 Indication: Severe spasticity which interferes with ADL,mobility and/or  hygiene and is unresponsive to medication management and other conservative care Informed consent was obtained after describing risks and benefits of the procedure with the patient. This includes bleeding, bruising, infection, excessive weakness, or medication side effects. A REMS form is on file and signed.  Needle: 68mm injectable monopolar needle electrode    Number of units per muscle Pectoralis Major 100 units Pectoralis Minor 50 units Biceps 0 units Brachioradialis 0 units FCR 25 units FCU 25 units FDS 75 units FDP 100 units FPL 25 units Palmaris Longus 0 units Pronator Teres 0 units Pronator Quadratus 0 units Lumbricals 0 units All injections were done after obtaining appropriate EMG activity and after negative drawback for blood. The patient tolerated the procedure well. Post procedure instructions were given. Return in about 3 months (around 05/13/2021).

## 2021-02-11 NOTE — Patient Instructions (Signed)
PLEASE FEEL FREE TO CALL OUR OFFICE WITH ANY PROBLEMS OR QUESTIONS ([phone])      

## 2021-02-12 ENCOUNTER — Ambulatory Visit: Payer: BC Managed Care – PPO | Admitting: Occupational Therapy

## 2021-02-12 ENCOUNTER — Encounter: Payer: Self-pay | Admitting: Occupational Therapy

## 2021-02-12 ENCOUNTER — Ambulatory Visit: Payer: BC Managed Care – PPO

## 2021-02-12 DIAGNOSIS — M6281 Muscle weakness (generalized): Secondary | ICD-10-CM

## 2021-02-12 DIAGNOSIS — G8110 Spastic hemiplegia affecting unspecified side: Secondary | ICD-10-CM

## 2021-02-12 DIAGNOSIS — R293 Abnormal posture: Secondary | ICD-10-CM | POA: Diagnosis not present

## 2021-02-12 DIAGNOSIS — R41840 Attention and concentration deficit: Secondary | ICD-10-CM | POA: Diagnosis not present

## 2021-02-12 DIAGNOSIS — R2689 Other abnormalities of gait and mobility: Secondary | ICD-10-CM | POA: Diagnosis not present

## 2021-02-12 DIAGNOSIS — M79602 Pain in left arm: Secondary | ICD-10-CM

## 2021-02-12 DIAGNOSIS — R2681 Unsteadiness on feet: Secondary | ICD-10-CM

## 2021-02-12 DIAGNOSIS — I69354 Hemiplegia and hemiparesis following cerebral infarction affecting left non-dominant side: Secondary | ICD-10-CM | POA: Diagnosis not present

## 2021-02-12 DIAGNOSIS — R208 Other disturbances of skin sensation: Secondary | ICD-10-CM | POA: Diagnosis not present

## 2021-02-12 NOTE — Therapy (Signed)
[facility] [address] [place], [place], [address] Phone: [phone]   Fax:  [phone]  Occupational Therapy Treatment  Patient Details  Name: Derek [name] MRN: [id number] Date of Birth: 15-Mar-1976 Referring Provider (OT): Aliene Beams   Encounter Date: 02/12/2021   OT End of Session - 02/12/21 1716    Visit Number 7    Number of Visits 13    Date for OT Re-Evaluation 03/29/21    Authorization Type BCBS and [facility] Medicaid    Authorization Time Period awaiting auth    OT Start Time 1620    OT Stop Time 1700    OT Time Calculation (min) 40 min    Activity Tolerance Patient tolerated treatment well    Behavior During Therapy [facility] for tasks assessed/performed           Past Medical History:  Diagnosis Date  . 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 Alcohol dependence (HCC)   . Back pain   . Marijuana dependence (HCC)   . Tobacco dependence     Past Surgical History:  Procedure Laterality Date  . BUBBLE STUDY  01/10/2020   Procedure: BUBBLE STUDY;  Surgeon: Sande Rives, MD;  Location: [facility] ENDOSCOPY;  Service: Cardiovascular;;  . TEE WITHOUT CARDIOVERSION N/A 01/10/2020   Procedure: TRANSESOPHAGEAL ECHOCARDIOGRAM (TEE);  Surgeon: Sande Rives, MD;  Location: [facility] ENDOSCOPY;  Service: Cardiovascular;  Laterality: N/A;    There were no vitals filed for this visit.   Subjective Assessment - 02/12/21 1700    Subjective  Patient reports improved ability to get left leg into tub for shower    Currently in Pain? No/denies    Pain Score 0-No pain                        OT Treatments/Exercises (OP) - 02/12/21 1701      ADLs   Bathing Practiced tub transfers and bathing techniques to increase independence.  Patient able to clear left and right leg over tub wall, and then cross legs to wash feet - able to do right leg needed assist to wash left leg.      Neurological  Re-education Exercises   Other Exercises 1 Reviewed self stretch leaning down and stretching LUE toward floor.  Patient able to complete without pain.                  OT Education - 02/12/21 1715    Education Details reviewed and practised tub transfers and self bathing techniques    Person(s) Educated Patient    Methods Explanation;Demonstration;Tactile cues;Verbal cues    Comprehension Verbalized understanding;Need further instruction;Returned demonstration            OT Short Term Goals - 02/12/21 1624      OT SHORT TERM GOAL #1   Title Patient will complete a self stretching program for left upper extremity    Baseline not currently performing consistently    Time 4    Status On-going      OT SHORT TERM GOAL #2   Title Patient will dress upper body, pull over short sleeve tshirt with increased time and min assist    Baseline mod- total assist    Time 4    Period Weeks    Status Achieved      OT SHORT TERM GOAL #3   Title Patient will report pain of less than 4/10 with forward low reaching  tasks    Baseline Pain ranges toward 7-8/10    Time 4    Period Weeks    Status Achieved      OT SHORT TERM GOAL #4   Title Pt to bathe self with min assist seated    Baseline Patient  actively participates in bathing with max assist    Period Weeks    Status On-going      OT SHORT TERM GOAL #5   Title Pt to don/doff a front opening shirt or jacket with mod assist    Baseline max assist    Time 4    Period Weeks    Status Achieved             OT Long Term Goals - 02/12/21 1653      OT LONG TERM GOAL #1   Title Patient will complete an updated HEP to address movement in LUE    Baseline Inconsistent performance of HEP    Time 6    Period Weeks    Status On-going      OT LONG TERM GOAL #2   Title Patient will transfer into and out of shower with supervision    Baseline min assist    Time 6    Period Weeks    Status On-going      OT LONG TERM GOAL #3    Title Pt to verbalize understanding with potential A/E needs and task modifications to increase ease with ADLS and simple IADLS    Baseline Unable to verbalize    Time 6    Period Weeks    Status On-going      OT LONG TERM GOAL #4   Title Pt to be mod I level with bathing and UE dressing and min assist for LE dressing    Baseline mod-dependence    Time 6    Status On-going      OT LONG TERM GOAL #5   Title xxx                 Plan - 02/12/21 1716    Clinical Impression Statement Pt is completing more ADLS, recently rec'd Botox injection to LUE    OT Occupational Profile and History Detailed Assessment- Review of Records and additional review of physical, cognitive, psychosocial history related to current functional performance    Occupational performance deficits (Please refer to evaluation for details): ADL's;IADL's;Rest and Sleep    Body Structure / Function / Physical Skills ADL;Coordination;Endurance;GMC;Muscle spasms;UE functional use;Balance;Decreased knowledge of precautions;Sensation;Vision;Pain;IADL;Flexibility;Decreased knowledge of use of DME;Body mechanics;Dexterity;FMC;Proprioception;Strength;Tone;ROM;Mobility;Edema    Cognitive Skills Attention;Emotional;Energy/Drive;Learn;Memory;Sequencing;Safety Awareness;Problem Solve;Perception    Rehab Potential Fair    Clinical Decision Making Several treatment options, min-mod task modification necessary    Comorbidities Affecting Occupational Performance: May have comorbidities impacting occupational performance    Modification or Assistance to Complete Evaluation  Min-Moderate modification of tasks or assist with assess necessary to complete eval    OT Frequency 2x / week    OT Duration 6 weeks    OT Treatment/Interventions Self-care/ADL training;Aquatic Therapy;Electrical Stimulation;Therapeutic exercise;Visual/perceptual remediation/compensation;Moist Heat;Neuromuscular education;Splinting;Patient/family education;Balance  training;Therapeutic activities;Functional Mobility Training;Fluidtherapy;Ultrasound;Cryotherapy;Contrast Bath;DME and/or AE instruction;Manual Therapy;Passive range of motion;Cognitive remediation/compensation    Plan review UB dressing, self-PROM, weight bearing, moist heat on LUE shoulder - pt is sch to get botox on 4/13 in UE    OT Home Exercise Plan review prior exercises    Consulted and Agree with Plan of Care Patient  Patient will benefit from skilled therapeutic intervention in order to improve the following deficits and impairments:   Body Structure / Function / Physical Skills: ADL,Coordination,Endurance,GMC,Muscle spasms,UE functional use,Balance,Decreased knowledge of precautions,Sensation,Vision,Pain,IADL,Flexibility,Decreased knowledge of use of DME,Body mechanics,Dexterity,FMC,Proprioception,Strength,Tone,ROM,Mobility,Edema Cognitive Skills: Attention,Emotional,Energy/Drive,Learn,Memory,Sequencing,Safety Awareness,Problem Solve,Perception     Visit Diagnosis: Spastic hemiplegia affecting nondominant side (HCC)  Pain in left arm  Muscle weakness  Muscle weakness (generalized)  Unsteadiness on feet    Problem List Patient Active Problem List   Diagnosis Date Noted  . Neuropathic pain 04/09/2020  . Pain   . Spastic hemiparesis (HCC)   . History of hypertension   . Dyslipidemia   . 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 Leukocytosis 01/12/2020  . Cerebrovascular accident (CVA) due to thrombosis of right middle cerebral artery (HCC)   . Chronic pain syndrome   . Hypokalemia   . Dysphagia, post-stroke   . 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 Tobacco dependence   . Marijuana dependence (HCC)   . Alcohol dependence (HCC)     Collier [place], OTR/L 02/12/2021, 5:18 PM  [facility] [address] [place], [place], [address] Phone: [phone]   Fax:   [phone]  Name: KARLOS SCADDEN MRN: [id number] Date of Birth: [birth year]

## 2021-02-12 NOTE — Therapy (Signed)
[facility] [address] [place], [place], [address] Phone: [phone]   Fax:  [phone]  Physical Therapy Treatment  Patient Details  Name: Derek [name] MRN: [id number] Date of Birth: 1975/11/25 Referring Provider (PT): [name]   Encounter Date: 02/12/2021   PT End of Session - 02/12/21 1536    Visit Number 8    Number of Visits 17    Authorization Type BCBS with medicaid secondary    PT Start Time 1531    PT Stop Time 1615    PT Time Calculation (min) 44 min    Equipment Utilized During Treatment Gait belt    Activity Tolerance Patient tolerated treatment well    Behavior During Therapy Flat affect           Past Medical History:  Diagnosis Date  . Acute ischemic right MCA stroke ([place]) 01/07/2020  . Alcohol dependence ([place])   . Back pain   . Marijuana dependence ([place])   . Tobacco dependence     Past Surgical History:  Procedure Laterality Date  . BUBBLE STUDY  01/10/2020   Procedure: BUBBLE STUDY;  Surgeon: Geralynn Rile, MD;  Location: [facility];  Service: Cardiovascular;;  . TEE WITHOUT CARDIOVERSION N/A 01/10/2020   Procedure: TRANSESOPHAGEAL ECHOCARDIOGRAM (TEE);  Surgeon: Geralynn Rile, MD;  Location: [facility];  Service: Cardiovascular;  Laterality: N/A;    There were no vitals filed for this visit.   Subjective Assessment - 02/12/21 1534    Subjective Patient reports that got Botox yesterday, was terrifying as he hates needles. No falls. Patient is starting to wear new shoes with foot up brace.    Patient Stated Goals Pt would like to work on improving left leg strength, improve bend in it, to make it easier to walk.    Currently in Pain? Yes    Pain Score 3     Pain Location Shoulder    Pain Orientation Left    Pain Descriptors / Indicators Sore    Pain Type Chronic pain    Pain Onset More than a month ago            [facility] Adult PT Treatment/Exercise -  02/12/21 0001      Transfers   Transfers Sit to Stand;Stand to Sit    Sit to Stand 5: Supervision    Five time sit to stand comments  28.76 secs without UE support from mat    Stand to Sit 5: Supervision    Number of Reps 10 reps;2 sets    Comments completed sit <> stands x 10 reps with 2" block under RLE. PT providing tactile cues for improved weight shift. Then completed x 10 reps with staggered stance with LLE posterior to promote strengthening.      Ambulation/Gait   Ambulation/Gait Yes    Ambulation/Gait Assistance 5: Supervision    Ambulation/Gait Assistance Details Completed ambulation x 250 ft with [facility]. PT providing intermittent verbal/tactile cues for improved upright posture and reduced lateral trunk lean.    Ambulation Distance (Feet) 250 Feet    Assistive device Large base quad cane    Gait Pattern Step-through pattern;Decreased hip/knee flexion - left    Ambulation Surface Level;Indoor      Neuro Re-ed    Neuro Re-ed Details  Standing on rockerboard positioned lateral: completed static standing x 1 minute without UE support working on equal weight shift, as patient often wants to demo increased weight shift to RLE. Then completed  lateral weight shift to R/L x 15 reps working on maintaining balance and improved weight shift to LLE. completed forward stepping with LLE with PT providing resistance via red theraband around pelvis x 15 reps. manual faciliation provided for improved pelvic rotation.      Exercises   Exercises Knee/Hip      Knee/Hip Exercises: Stretches   Active Hamstring Stretch Both;3 reps;30 seconds;Limitations    Active Hamstring Stretch Limitations completed on LLE, paitent able to demo properly just require tactile/verbal cues for upright posture and improved pelvic tilt    Other Knee/Hip Stretches with patient seated PT completed manual gastroc stretch on LLE, 3 x 30 seconds, with progressing into range as tolerated.             PT Short Term Goals -  02/12/21 1537      PT SHORT TERM GOAL #1   Title Pt will be independent with HEP for strengthening, flexibility and balance.    Baseline not completing HEP consistently    Time 4    Period Weeks    Status On-going    Target Date 02/13/21      PT SHORT TERM GOAL #2   Title Pt will ambulate >300' on level surfaces with quad cane mod I with decreased right trunk lean for improved household and short community distances.    Baseline 200' supervision/CGA with quad cane; 250' with supervision continue to demo right trunk lean with ambulation    Time 4    Period Weeks    Status Partially Met    Target Date 02/13/21      PT SHORT TERM GOAL #3   Title Pt will decrease 5 x sit to stand from 39.79 sec to <35 sec for improved balance and functional strength.    Baseline 01/13/21 39.79 sec from w/c with hand; 28.76 secs from mat    Time 4    Period Weeks    Status Achieved    Target Date 02/13/21             PT Long Term Goals - 01/14/21 1159      PT LONG TERM GOAL #1   Title Pt will be able to walk >10 minutes in therapy for improved activity tolerance and mobility.    Baseline <5 min    Time 8    Period Weeks    Status New    Target Date 03/15/21      PT LONG TERM GOAL #2   Title Patient will demonstrate ability to ambulate > 600 ft w/ LRAD supervision on outdoor/indoor surfaces to demo improved community mobility    Baseline 200' on level supervision/CGA    Time 8    Status New    Target Date 03/15/21      PT LONG TERM GOAL #3   Title Pt will decrease TUG from 31.28 sec to <35 sec for improved balance and decreased fall risk.    Baseline 01/13/21 31.28 sec    Time 8    Period Weeks    Status New    Target Date 03/15/21      PT LONG TERM GOAL #4   Title Pt will increase gait speed from 0.53ms to >0.525m for improved household mobility.    Baseline 01/13/21 0.3119m   Time 8    Period Weeks    Status New    Target Date 03/15/21      PT LONG TERM GOAL #5   Title Pt  will  be able to negotiate up/down ramp and curb with quad cane CGA.    Baseline not performing    Time 8    Period Weeks    Status New    Target Date 03/15/21                 Plan - 02/12/21 1629    Clinical Impression Statement Completed assesment of patient's progress toward all STGs. Patient able to meet STG #3 and partially meet #2. Patient improved 5x sit <> stand to 28.76 secs. With ambulation patient continue to demonstrate trunk lean to R, but able to demo improvements with cuees. Will continue to progress toward all LTGs.    Personal Factors and Comorbidities Comorbidity 3+;Time since onset of injury/illness/exacerbation    Comorbidities Right MCA CVA with Spastic Hemiparesis 01/07/20, alchohol dependence, back pain, marijuana dependence, tobacco dependence.    Examination-Activity Limitations Transfers;Stairs;Locomotion Level;Stand    Examination-Participation Restrictions Community Activity;Cleaning    Stability/Clinical Decision Making Evolving/Moderate complexity    Rehab Potential Good    PT Frequency 2x / week   plus eval   PT Duration 8 weeks    PT Treatment/Interventions ADLs/Self Care Home Management;DME Instruction;Therapeutic activities;Stair training;Gait training;Therapeutic exercise;Balance training;Neuromuscular re-education;Patient/family education;Orthotic Fit/Training;Manual techniques;Passive range of motion;Vestibular    PT Next Visit Plan Has not worked on stretches with wife. Continue Gait training. Continue to work on weight shifting to left more with less right lateral trunk lean to clear leg. Left hip flexor and extensor strengthening.    Consulted and Agree with Plan of Care Patient           Patient will benefit from skilled therapeutic intervention in order to improve the following deficits and impairments:  Abnormal gait,Decreased knowledge of use of DME,Decreased endurance,Decreased mobility,Decreased range of motion,Decreased strength,Impaired  flexibility,Impaired tone,Impaired UE functional use,Postural dysfunction  Visit Diagnosis: Other abnormalities of gait and mobility  Muscle weakness (generalized)  Unsteadiness on feet     Problem List Patient Active Problem List   Diagnosis Date Noted  . Neuropathic pain 04/09/2020  . Pain   . Spastic hemiparesis ([place])   . History of hypertension   . Dyslipidemia   . Right middle cerebral artery stroke ([place]) 01/15/2020  . Leukocytosis 01/12/2020  . Cerebrovascular accident (CVA) due to thrombosis of right middle cerebral artery ([place])   . Chronic pain syndrome   . Hypokalemia   . Dysphagia, post-stroke   . Acute ischemic right MCA stroke ([place]) 01/07/2020  . Tobacco dependence   . Marijuana dependence ([place])   . Alcohol dependence ([place])     Jones Bales, PT, DPT 02/12/2021, 4:35 PM  [facility] [address] [place], [place], [address] Phone: [phone]   Fax:  [phone]  Name: GASPER HOPES MRN: [id number] Date of Birth: 01-Jan-1976

## 2021-02-16 ENCOUNTER — Other Ambulatory Visit: Payer: Self-pay | Admitting: Physical Medicine & Rehabilitation

## 2021-02-16 DIAGNOSIS — G8110 Spastic hemiplegia affecting unspecified side: Secondary | ICD-10-CM

## 2021-02-16 DIAGNOSIS — M792 Neuralgia and neuritis, unspecified: Secondary | ICD-10-CM

## 2021-02-17 ENCOUNTER — Ambulatory Visit: Payer: BC Managed Care – PPO | Admitting: Occupational Therapy

## 2021-02-17 ENCOUNTER — Other Ambulatory Visit: Payer: Self-pay

## 2021-02-17 ENCOUNTER — Ambulatory Visit: Payer: BC Managed Care – PPO

## 2021-02-17 DIAGNOSIS — G8110 Spastic hemiplegia affecting unspecified side: Secondary | ICD-10-CM | POA: Diagnosis not present

## 2021-02-17 DIAGNOSIS — M6281 Muscle weakness (generalized): Secondary | ICD-10-CM

## 2021-02-17 DIAGNOSIS — R293 Abnormal posture: Secondary | ICD-10-CM | POA: Diagnosis not present

## 2021-02-17 DIAGNOSIS — R2681 Unsteadiness on feet: Secondary | ICD-10-CM

## 2021-02-17 DIAGNOSIS — M79602 Pain in left arm: Secondary | ICD-10-CM

## 2021-02-17 DIAGNOSIS — R41840 Attention and concentration deficit: Secondary | ICD-10-CM | POA: Diagnosis not present

## 2021-02-17 DIAGNOSIS — R2689 Other abnormalities of gait and mobility: Secondary | ICD-10-CM | POA: Diagnosis not present

## 2021-02-17 DIAGNOSIS — I69354 Hemiplegia and hemiparesis following cerebral infarction affecting left non-dominant side: Secondary | ICD-10-CM | POA: Diagnosis not present

## 2021-02-17 DIAGNOSIS — R208 Other disturbances of skin sensation: Secondary | ICD-10-CM | POA: Diagnosis not present

## 2021-02-17 NOTE — Therapy (Signed)
[facility] [address] [place], [place], [address] Phone: [phone]   Fax:  [phone]  Occupational Therapy Treatment  Patient Details  Name: Derek [name] MRN: [id number] Date of Birth: December 19, 1975 Referring Provider (OT): Aliene Beams   Encounter Date: 02/17/2021   OT End of Session - 02/17/21 1532    Visit Number 8    Number of Visits 13    Date for OT Re-Evaluation 03/29/21    Authorization Type BCBS and [facility] Medicaid    Authorization Time Period awaiting auth    OT Start Time 1732    OT Stop Time 1815    OT Time Calculation (min) 43 min    Activity Tolerance Patient tolerated treatment well    Behavior During Therapy [facility] for tasks assessed/performed           Past Medical History:  Diagnosis Date  . 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 Alcohol dependence ([facility])   . Back pain   . Marijuana dependence ([facility])   . Tobacco dependence     Past Surgical History:  Procedure Laterality Date  . BUBBLE STUDY  01/10/2020   Procedure: BUBBLE STUDY;  Surgeon: Sande Rives, MD;  Location: [facility] ENDOSCOPY;  Service: Cardiovascular;;  . TEE WITHOUT CARDIOVERSION N/A 01/10/2020   Procedure: TRANSESOPHAGEAL ECHOCARDIOGRAM (TEE);  Surgeon: Sande Rives, MD;  Location: [facility] ENDOSCOPY;  Service: Cardiovascular;  Laterality: N/A;    There were no vitals filed for this visit.   Subjective Assessment - 02/17/21 1533    Subjective  Pt reports pain in shoulder. Pt report running out of some prescriptions and not being able to get them for a couple of days.    Currently in Pain? Yes    Pain Score 3     Pain Location Shoulder    Pain Orientation Left    Pain Descriptors / Indicators Aching    Pain Type Chronic pain    Pain Onset More than a month ago    Pain Frequency Constant             TREATMENT  ADLs walking into kitchen carrying styrofoam cup with supervision. Pt obtained cup  with handle and lid and filled with water and carried back to seat with supervision and min spills. Doffing and donning button up long sleeve shirt with min A for pulling shirt over left elbow and around shoulder on right side. Pt was able to unbutton all buttons and button 90% of buttons.   Manual Therapy deep tissue massage to bicep insertion point for inhibition of tone/spasticity.   Neuromuscular Reeducation postural control and working on scapular retraction for upright sitting posture. X 10 reps.                     OT Short Term Goals - 02/12/21 1624      OT SHORT TERM GOAL #1   Title Patient will complete a self stretching program for left upper extremity    Baseline not currently performing consistently    Time 4    Status On-going      OT SHORT TERM GOAL #2   Title Patient will dress upper body, pull over short sleeve tshirt with increased time and min assist    Baseline mod- total assist    Time 4    Period Weeks    Status Achieved      OT SHORT TERM GOAL #3   Title Patient will  report pain of less than 4/10 with forward low reaching tasks    Baseline Pain ranges toward 7-8/10    Time 4    Period Weeks    Status Achieved      OT SHORT TERM GOAL #4   Title Pt to bathe self with min assist seated    Baseline Patient  actively participates in bathing with max assist    Period Weeks    Status On-going      OT SHORT TERM GOAL #5   Title Pt to don/doff a front opening shirt or jacket with mod assist    Baseline max assist    Time 4    Period Weeks    Status Achieved             OT Long Term Goals - 02/12/21 1653      OT LONG TERM GOAL #1   Title Patient will complete an updated HEP to address movement in LUE    Baseline Inconsistent performance of HEP    Time 6    Period Weeks    Status On-going      OT LONG TERM GOAL #2   Title Patient will transfer into and out of shower with supervision    Baseline min assist    Time 6    Period  Weeks    Status On-going      OT LONG TERM GOAL #3   Title Pt to verbalize understanding with potential A/E needs and task modifications to increase ease with ADLS and simple IADLS    Baseline Unable to verbalize    Time 6    Period Weeks    Status On-going      OT LONG TERM GOAL #4   Title Pt to be mod I level with bathing and UE dressing and min assist for LE dressing    Baseline mod-dependence    Time 6    Status On-going      OT LONG TERM GOAL #5   Title xxx                 Plan - 02/17/21 1649    Clinical Impression Statement Pt continues to progress towards increased independence and participation in ADLs. Decrease in pain in LUE d/t botox this day.    OT Occupational Profile and History Detailed Assessment- Review of Records and additional review of physical, cognitive, psychosocial history related to current functional performance    Occupational performance deficits (Please refer to evaluation for details): ADL's;IADL's;Rest and Sleep    Body Structure / Function / Physical Skills ADL;Coordination;Endurance;GMC;Muscle spasms;UE functional use;Balance;Decreased knowledge of precautions;Sensation;Vision;Pain;IADL;Flexibility;Decreased knowledge of use of DME;Body mechanics;Dexterity;FMC;Proprioception;Strength;Tone;ROM;Mobility;Edema    Cognitive Skills Attention;Emotional;Energy/Drive;Learn;Memory;Sequencing;Safety Awareness;Problem Solve;Perception    Rehab Potential Fair    Clinical Decision Making Several treatment options, min-mod task modification necessary    Comorbidities Affecting Occupational Performance: May have comorbidities impacting occupational performance    Modification or Assistance to Complete Evaluation  Min-Moderate modification of tasks or assist with assess necessary to complete eval    OT Frequency 2x / week    OT Duration 6 weeks    OT Treatment/Interventions Self-care/ADL training;Aquatic Therapy;Electrical Stimulation;Therapeutic  exercise;Visual/perceptual remediation/compensation;Moist Heat;Neuromuscular education;Splinting;Patient/family education;Balance training;Therapeutic activities;Functional Mobility Training;Fluidtherapy;Ultrasound;Cryotherapy;Contrast Bath;DME and/or AE instruction;Manual Therapy;Passive range of motion;Cognitive remediation/compensation    Plan review UB dressing, self-PROM, weight bearing, moist heat on LUE shoulder - pt is sch to get botox on 4/13 in UE    OT Home Exercise Plan review prior exercises  Consulted and Agree with Plan of Care Patient           Patient will benefit from skilled therapeutic intervention in order to improve the following deficits and impairments:   Body Structure / Function / Physical Skills: ADL,Coordination,Endurance,GMC,Muscle spasms,UE functional use,Balance,Decreased knowledge of precautions,Sensation,Vision,Pain,IADL,Flexibility,Decreased knowledge of use of DME,Body mechanics,Dexterity,FMC,Proprioception,Strength,Tone,ROM,Mobility,Edema Cognitive Skills: Attention,Emotional,Energy/Drive,Learn,Memory,Sequencing,Safety Awareness,Problem Solve,Perception     Visit Diagnosis: Muscle weakness (generalized)  Unsteadiness on feet  Spastic hemiplegia affecting nondominant side (HCC)  Pain in left arm  Spastic hemiplegia of left nondominant 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 [facility])    Problem List Patient Active Problem List   Diagnosis Date Noted  . Neuropathic pain 04/09/2020  . Pain   . Spastic hemiparesis (HCC)   . History of hypertension   . Dyslipidemia   . 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 Leukocytosis 01/12/2020  . Cerebrovascular accident (CVA) due to thrombosis of right middle cerebral artery (HCC)   . Chronic pain syndrome   . Hypokalemia   . Dysphagia, post-stroke   . 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 Tobacco dependence   . Marijuana dependence (HCC)   . Alcohol dependence (HCC)      Junious Dresser MOT, OTR/L  02/17/2021, 4:50 PM  [facility] [address] [place], [place], [address] Phone: [phone]   Fax:  [phone]  Name: Derek [name] MRN: [id number] Date of Birth: July 19, 1976

## 2021-02-17 NOTE — Therapy (Signed)
[facility] [address] [place], [place], [address] Phone: [phone]   Fax:  [phone]  Physical Therapy Treatment  Patient Details  Name: Derek [name] MRN: [id number] Date of Birth: 03/31/1976 Referring Provider (PT): [name]   Encounter Date: 02/17/2021   PT End of Session - 02/17/21 1453    Visit Number 10    Number of Visits 17    Authorization Type BCBS with medicaid secondary    PT Start Time 9532    PT Stop Time 1530    PT Time Calculation (min) 45 min    Equipment Utilized During Treatment Gait belt    Activity Tolerance Patient tolerated treatment well    Behavior During Therapy Flat affect           Past Medical History:  Diagnosis Date  . Acute ischemic right MCA stroke ([place]) 01/07/2020  . Alcohol dependence ([place])   . Back pain   . Marijuana dependence ([place])   . Tobacco dependence     Past Surgical History:  Procedure Laterality Date  . BUBBLE STUDY  01/10/2020   Procedure: BUBBLE STUDY;  Surgeon: Geralynn Rile, MD;  Location: [facility];  Service: Cardiovascular;;  . TEE WITHOUT CARDIOVERSION N/A 01/10/2020   Procedure: TRANSESOPHAGEAL ECHOCARDIOGRAM (TEE);  Surgeon: Geralynn Rile, MD;  Location: [facility];  Service: Cardiovascular;  Laterality: N/A;    There were no vitals filed for this visit.   Subjective Assessment - 02/17/21 1453    Subjective Pt reports 3/10 pain in R shoulder. Can't tell if BOTOX is helping yet.    Patient Stated Goals Pt would like to work on improving left leg strength, improve bend in it, to make it easier to walk.    Currently in Pain? Yes    Pain Score 3     Pain Location Shoulder    Pain Orientation Left    Pain Descriptors / Indicators Aching    Pain Onset More than a month ago           Ambulated from waiting area to therapy gym with large base quad cane x 1 Walking fwd and bwd: 5 x 10 feet with Large based quad cane,  CGA Sit to stand without UE support: tactile cues and CGA for leaning forward and verbal cues to control eccentric descent: 2 x 5 Walking up and down ramp fwd only: 5x, verbal cues for keeping cane closer and stepping up the ramp with R LE first Walking up and down curb step: with quad cane: min A: verbal cues for sequencing: 4x up the curb, 5x down the curb "Good goes to heaven, bad goes to hell"- pt reported he was going down leading with R LE first at home Standing picking up 5lb KB from 6" box: 10x Gait training: 1 x 210' with large based quad cane SBA                            PT Short Term Goals - 02/12/21 1537      PT SHORT TERM GOAL #1   Title Pt will be independent with HEP for strengthening, flexibility and balance.    Baseline not completing HEP consistently    Time 4    Period Weeks    Status On-going    Target Date 02/13/21      PT SHORT TERM GOAL #2   Title Pt will ambulate >300' on level  surfaces with quad cane mod I with decreased right trunk lean for improved household and short community distances.    Baseline 200' supervision/CGA with quad cane; 250' with supervision continue to demo right trunk lean with ambulation    Time 4    Period Weeks    Status Partially Met    Target Date 02/13/21      PT SHORT TERM GOAL #3   Title Pt will decrease 5 x sit to stand from 39.79 sec to <35 sec for improved balance and functional strength.    Baseline 01/13/21 39.79 sec from w/c with hand; 28.76 secs from mat    Time 4    Period Weeks    Status Achieved    Target Date 02/13/21             PT Long Term Goals - 01/14/21 1159      PT LONG TERM GOAL #1   Title Pt will be able to walk >10 minutes in therapy for improved activity tolerance and mobility.    Baseline <5 min    Time 8    Period Weeks    Status New    Target Date 03/15/21      PT LONG TERM GOAL #2   Title Patient will demonstrate ability to ambulate > 600 ft w/ LRAD supervision on  outdoor/indoor surfaces to demo improved community mobility    Baseline 200' on level supervision/CGA    Time 8    Status New    Target Date 03/15/21      PT LONG TERM GOAL #3   Title Pt will decrease TUG from 31.28 sec to <35 sec for improved balance and decreased fall risk.    Baseline 01/13/21 31.28 sec    Time 8    Period Weeks    Status New    Target Date 03/15/21      PT LONG TERM GOAL #4   Title Pt will increase gait speed from 0.20m/s to >0.70m/s for improved household mobility.    Baseline 01/13/21 0.63m/s    Time 8    Period Weeks    Status New    Target Date 03/15/21      PT LONG TERM GOAL #5   Title Pt will be able to negotiate up/down ramp and curb with quad cane CGA.    Baseline not performing    Time 8    Period Weeks    Status New    Target Date 03/15/21                 Plan - 02/17/21 1454    Clinical Impression Statement Today's session was focused progressing his functional ambulation, bending to pick things up, sit to stand transfers and going up and down curb and ramp for community access. Pt tolerated session well.    Personal Factors and Comorbidities Comorbidity 3+;Time since onset of injury/illness/exacerbation    Comorbidities Right MCA CVA with Spastic Hemiparesis 01/07/20, alchohol dependence, back pain, marijuana dependence, tobacco dependence.    Examination-Activity Limitations Transfers;Stairs;Locomotion Level;Stand    Examination-Participation Restrictions Community Activity;Cleaning    Stability/Clinical Decision Making Evolving/Moderate complexity    Rehab Potential Good    PT Frequency 2x / week   plus eval   PT Duration 8 weeks    PT Treatment/Interventions ADLs/Self Care Home Management;DME Instruction;Therapeutic activities;Stair training;Gait training;Therapeutic exercise;Balance training;Neuromuscular re-education;Patient/family education;Orthotic Fit/Training;Manual techniques;Passive range of motion;Vestibular    PT Next Visit  Plan Has not worked on stretches with wife.  Continue Gait training. Continue to work on weight shifting to left more with less right lateral trunk lean to clear leg. Left hip flexor and extensor strengthening.    Consulted and Agree with Plan of Care Patient           Patient will benefit from skilled therapeutic intervention in order to improve the following deficits and impairments:  Abnormal gait,Decreased knowledge of use of DME,Decreased endurance,Decreased mobility,Decreased range of motion,Decreased strength,Impaired flexibility,Impaired tone,Impaired UE functional use,Postural dysfunction  Visit Diagnosis: Other abnormalities of gait and mobility  Muscle weakness (generalized)  Unsteadiness on feet  Spastic hemiplegia affecting nondominant side (HCC)  Abnormal posture     Problem List Patient Active Problem List   Diagnosis Date Noted  . Neuropathic pain 04/09/2020  . Pain   . Spastic hemiparesis ([place])   . History of hypertension   . Dyslipidemia   . 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 Leukocytosis 01/12/2020  . Cerebrovascular accident (CVA) due to thrombosis of right middle cerebral artery ([place])   . Chronic pain syndrome   . Hypokalemia   . Dysphagia, post-stroke   . Acute ischemic right MCA stroke ([place]) 01/07/2020  . Tobacco dependence   . Marijuana dependence ([place])   . Alcohol dependence ([place])     Kerrie Pleasure , PT 02/17/2021, 3:24 PM  [facility] [address] [place], [place], [address] Phone: [phone]   Fax:  [phone]  Name: DONTRELLE MAZON MRN: [id number] Date of Birth: 12/04/1975

## 2021-02-19 ENCOUNTER — Ambulatory Visit: Payer: BC Managed Care – PPO | Admitting: Occupational Therapy

## 2021-02-19 ENCOUNTER — Ambulatory Visit: Payer: BC Managed Care – PPO

## 2021-02-19 ENCOUNTER — Other Ambulatory Visit: Payer: Self-pay

## 2021-02-19 ENCOUNTER — Encounter: Payer: Self-pay | Admitting: Occupational Therapy

## 2021-02-19 DIAGNOSIS — R2681 Unsteadiness on feet: Secondary | ICD-10-CM | POA: Diagnosis not present

## 2021-02-19 DIAGNOSIS — R2689 Other abnormalities of gait and mobility: Secondary | ICD-10-CM

## 2021-02-19 DIAGNOSIS — G8110 Spastic hemiplegia affecting unspecified side: Secondary | ICD-10-CM | POA: Diagnosis not present

## 2021-02-19 DIAGNOSIS — M6281 Muscle weakness (generalized): Secondary | ICD-10-CM

## 2021-02-19 DIAGNOSIS — I69354 Hemiplegia and hemiparesis following cerebral infarction affecting left non-dominant side: Secondary | ICD-10-CM | POA: Diagnosis not present

## 2021-02-19 DIAGNOSIS — M79602 Pain in left arm: Secondary | ICD-10-CM | POA: Diagnosis not present

## 2021-02-19 DIAGNOSIS — R293 Abnormal posture: Secondary | ICD-10-CM | POA: Diagnosis not present

## 2021-02-19 DIAGNOSIS — R41840 Attention and concentration deficit: Secondary | ICD-10-CM | POA: Diagnosis not present

## 2021-02-19 DIAGNOSIS — R208 Other disturbances of skin sensation: Secondary | ICD-10-CM | POA: Diagnosis not present

## 2021-02-19 NOTE — Therapy (Signed)
[facility] [address] [place], [place], [address] Phone: [phone]   Fax:  [phone]  Occupational Therapy Treatment  Patient Details  Name: Derek [name] MRN: [id number] Date of Birth: 07-25-1976 Referring Provider (OT): Aliene Beams   Encounter Date: 02/19/2021   OT End of Session - 02/19/21 1453    Visit Number 9    Number of Visits 13    Date for OT Re-Evaluation 03/29/21    Authorization Type BC[employer] and [facility] Medicaid    Authorization Time Period awaiting auth    OT Start Time 1448    OT Stop Time 1530    OT Time Calculation (min) 42 min    Activity Tolerance Patient tolerated treatment well    Behavior During Therapy [facility] for tasks assessed/performed           Past Medical History:  Diagnosis Date  . 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 Alcohol dependence (HCC)   . Back pain   . Marijuana dependence (HCC)   . Tobacco dependence     Past Surgical History:  Procedure Laterality Date  . BUBBLE STUDY  01/10/2020   Procedure: BUBBLE STUDY;  Surgeon: Sande Rives, MD;  Location: [facility] ENDOSCOPY;  Service: Cardiovascular;;  . TEE WITHOUT CARDIOVERSION N/A 01/10/2020   Procedure: TRANSESOPHAGEAL ECHOCARDIOGRAM (TEE);  Surgeon: Sande Rives, MD;  Location: [facility] ENDOSCOPY;  Service: Cardiovascular;  Laterality: N/A;    There were no vitals filed for this visit.   Subjective Assessment - 02/19/21 1454    Subjective  Pt reports a little bit of pain.    Currently in Pain? No/denies    Pain Onset --           TREATMENT  Weight bearing in LUE for inhibition of tone and facilitation of muscle activation while weight shifting laterally and forward/backward while seated edge of mat. Pt required max assistance for maintaining hand position on mat for weight bearing.   Self PROM  While seated and stretching to floor x 10  - encourage pt to do 3-4 times a days for mobility of  LUE  Table slides x 10 forward reaching, horizontal abduction  Counter Top weight bearing with LUE and weight shifts laterally standing at sink                   OT Short Term Goals - 02/12/21 1624      OT SHORT TERM GOAL #1   Title Patient will complete a self stretching program for left upper extremity    Baseline not currently performing consistently    Time 4    Status On-going      OT SHORT TERM GOAL #2   Title Patient will dress upper body, pull over short sleeve tshirt with increased time and min assist    Baseline mod- total assist    Time 4    Period Weeks    Status Achieved      OT SHORT TERM GOAL #3   Title Patient will report pain of less than 4/10 with forward low reaching tasks    Baseline Pain ranges toward 7-8/10    Time 4    Period Weeks    Status Achieved      OT SHORT TERM GOAL #4   Title Pt to bathe self with min assist seated    Baseline Patient  actively participates in bathing with max assist    Period Weeks  Status On-going      OT SHORT TERM GOAL #5   Title Pt to don/doff a front opening shirt or jacket with mod assist    Baseline max assist    Time 4    Period Weeks    Status Achieved             OT Long Term Goals - 02/12/21 1653      OT LONG TERM GOAL #1   Title Patient will complete an updated HEP to address movement in LUE    Baseline Inconsistent performance of HEP    Time 6    Period Weeks    Status On-going      OT LONG TERM GOAL #2   Title Patient will transfer into and out of shower with supervision    Baseline min assist    Time 6    Period Weeks    Status On-going      OT LONG TERM GOAL #3   Title Pt to verbalize understanding with potential A/E needs and task modifications to increase ease with ADLS and simple IADLS    Baseline Unable to verbalize    Time 6    Period Weeks    Status On-going      OT LONG TERM GOAL #4   Title Pt to be mod I level with bathing and UE dressing and min assist for  LE dressing    Baseline mod-dependence    Time 6    Status On-going      OT LONG TERM GOAL #5   Title xxx                 Plan - 02/19/21 1523    Clinical Impression Statement Pt continues to progress towards goals. Encouraging patient to continue to do more at home with ADLs and IADLs.    OT Occupational Profile and History Detailed Assessment- Review of Records and additional review of physical, cognitive, psychosocial history related to current functional performance    Occupational performance deficits (Please refer to evaluation for details): ADL's;IADL's;Rest and Sleep    Body Structure / Function / Physical Skills ADL;Coordination;Endurance;GMC;Muscle spasms;UE functional use;Balance;Decreased knowledge of precautions;Sensation;Vision;Pain;IADL;Flexibility;Decreased knowledge of use of DME;Body mechanics;Dexterity;FMC;Proprioception;Strength;Tone;ROM;Mobility;Edema    Cognitive Skills Attention;Emotional;Energy/Drive;Learn;Memory;Sequencing;Safety Awareness;Problem Solve;Perception    Rehab Potential Fair    Clinical Decision Making Several treatment options, min-mod task modification necessary    Comorbidities Affecting Occupational Performance: May have comorbidities impacting occupational performance    Modification or Assistance to Complete Evaluation  Min-Moderate modification of tasks or assist with assess necessary to complete eval    OT Frequency 2x / week    OT Duration 6 weeks    OT Treatment/Interventions Self-care/ADL training;Aquatic Therapy;Electrical Stimulation;Therapeutic exercise;Visual/perceptual remediation/compensation;Moist Heat;Neuromuscular education;Splinting;Patient/family education;Balance training;Therapeutic activities;Functional Mobility Training;Fluidtherapy;Ultrasound;Cryotherapy;Contrast Bath;DME and/or AE instruction;Manual Therapy;Passive range of motion;Cognitive remediation/compensation    Plan review UB dressing, self-PROM, weight bearing,  moist heat on LUE shoulder    OT Home Exercise Plan review prior exercises    Consulted and Agree with Plan of Care Patient           Patient will benefit from skilled therapeutic intervention in order to improve the following deficits and impairments:   Body Structure / Function / Physical Skills: ADL,Coordination,Endurance,GMC,Muscle spasms,UE functional use,Balance,Decreased knowledge of precautions,Sensation,Vision,Pain,IADL,Flexibility,Decreased knowledge of use of DME,Body mechanics,Dexterity,FMC,Proprioception,Strength,Tone,ROM,Mobility,Edema Cognitive Skills: Attention,Emotional,Energy/Drive,Learn,Memory,Sequencing,Safety Awareness,Problem Solve,Perception     Visit Diagnosis: Other abnormalities of gait and mobility  Muscle weakness (generalized)  Unsteadiness on feet  Spastic hemiplegia affecting nondominant  side [facility])  Attention and concentration deficit    Problem List Patient Active Problem List   Diagnosis Date Noted  . Neuropathic pain 04/09/2020  . Pain   . Spastic hemiparesis (HCC)   . History of hypertension   . Dyslipidemia   . 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 Leukocytosis 01/12/2020  . Cerebrovascular accident (CVA) due to thrombosis of right middle cerebral artery (HCC)   . Chronic pain syndrome   . Hypokalemia   . Dysphagia, post-stroke   . 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 Tobacco dependence   . Marijuana dependence (HCC)   . Alcohol dependence (HCC)     Junious Dresser MOT, OTR/L  02/19/2021, 3:26 PM  [facility] [address] [place], [place], [address] Phone: [phone]   Fax:  [phone]  Name: TRAESON DUSZA MRN: [id number] Date of Birth: 07-09-1976

## 2021-02-19 NOTE — Therapy (Signed)
[name] [name] [place], [place], [address] Phone: [phone]   Fax:  [phone]  Physical Therapy Treatment  Patient Details  Name: Derek [name] MRN: [id number] Date of Birth: Jan 02, 1976 Referring Provider (PT): [name]   Encounter Date: 02/19/2021   PT End of Session - 02/19/21 1536    Visit Number 11    Number of Visits 17    Authorization Type BCBS with medicaid secondary    PT Start Time 1532    PT Stop Time 1615    PT Time Calculation (min) 43 min    Equipment Utilized During Treatment Gait belt    Activity Tolerance Patient tolerated treatment well    Behavior During Therapy Flat affect           Past Medical History:  Diagnosis Date  . Acute ischemic right MCA stroke ([place]) 01/07/2020  . Alcohol dependence ([place])   . Back pain   . Marijuana dependence ([place])   . Tobacco dependence     Past Surgical History:  Procedure Laterality Date  . BUBBLE STUDY  01/10/2020   Procedure: BUBBLE STUDY;  Surgeon: Geralynn Rile, MD;  Location: [place];  Service: Cardiovascular;;  . TEE WITHOUT CARDIOVERSION N/A 01/10/2020   Procedure: TRANSESOPHAGEAL ECHOCARDIOGRAM (TEE);  Surgeon: Geralynn Rile, MD;  Location: [place];  Service: Cardiovascular;  Laterality: N/A;    There were no vitals filed for this visit.   Subjective Assessment - 02/19/21 1535    Subjective Patient reports mild pain in shoulder. Continues to walk into therapy sessions. No other new changes/complaints. Is not wearing the foot up brace today.    Patient Stated Goals Pt would like to work on improving left leg strength, improve bend in it, to make it easier to walk.    Currently in Pain? Yes    Pain Score 3     Pain Location Shoulder    Pain Orientation Left    Pain Descriptors / Indicators Aching    Pain Type Chronic pain    Pain Onset More than a month ago              [facility] Adult PT  Treatment/Exercise - 02/19/21 0001      Transfers   Transfers Sit to Stand;Stand to Sit    Sit to Stand 5: Supervision    Stand to Sit 5: Supervision      Ambulation/Gait   Ambulation/Gait Yes    Ambulation/Gait Assistance 5: Supervision;4: Min guard    Ambulation/Gait Assistance Details Completed ambulation on indoor/outdoor surfaces. Patient superivsion on indoors surfaces, require CGA on outdoor surfaces including pavement/grass. One instances of catching L Toe on grass surface requiring assistance to maintain balance. PT providing tactile cues for reduced R lateral lean.    Ambulation Distance (Feet) 500 Feet    Assistive device Large base quad cane    Gait Pattern Step-through pattern;Decreased hip/knee flexion - left    Ambulation Surface Level;Indoor;Unlevel;Outdoor;Paved;Grass    Curb 5: Supervision    Curb Details (indicate cue type and reason) completed curb x 2 reps outdoors, with patient able to demonstrate proper sequencing, supervision throughout.    Gait Comments PT adjusted patient's LBQC to patient's height, as one of friends raised it for him but is causing mild discomfort in R shoulder.      Neuro Re-ed    Neuro Re-ed Details  With patient supine: completed PNF D1 passively x 10 reps, working on improved reducion tone.  Then progressed to AROM wtih PT manual resistance provided.      Exercises   Exercises Knee/Hip      Knee/Hip Exercises: Stretches   Active Hamstring Stretch Left;2 reps;30 seconds    Active Hamstring Stretch Limitations reviewed stretches again with patient, and vebral cues provided for proper completion. Completed on LLE    Gastroc Stretch Left;3 reps;30 seconds    Gastroc Stretch Limitations PT completed manual stretch to L gastroc x 30 seconds, progressing to patient tolerable range             PT Education - 02/19/21 1633    Education Details Continue to walk into clinic with [facility]; Please complete stretches to promote improved tone  management.    Person(s) Educated Patient    Methods Explanation    Comprehension Verbalized understanding            PT Short Term Goals - 02/12/21 1537      PT SHORT TERM GOAL #1   Title Pt will be independent with HEP for strengthening, flexibility and balance.    Baseline not completing HEP consistently    Time 4    Period Weeks    Status On-going    Target Date 02/13/21      PT SHORT TERM GOAL #2   Title Pt will ambulate >300' on level surfaces with quad cane mod I with decreased right trunk lean for improved household and short community distances.    Baseline 200' supervision/CGA with quad cane; 250' with supervision continue to demo right trunk lean with ambulation    Time 4    Period Weeks    Status Partially Met    Target Date 02/13/21      PT SHORT TERM GOAL #3   Title Pt will decrease 5 x sit to stand from 39.79 sec to <35 sec for improved balance and functional strength.    Baseline 01/13/21 39.79 sec from w/c with hand; 28.76 secs from mat    Time 4    Period Weeks    Status Achieved    Target Date 02/13/21             PT Long Term Goals - 01/14/21 1159      PT LONG TERM GOAL #1   Title Pt will be able to walk >10 minutes in therapy for improved activity tolerance and mobility.    Baseline <5 min    Time 8    Period Weeks    Status New    Target Date 03/15/21      PT LONG TERM GOAL #2   Title Patient will demonstrate ability to ambulate > 600 ft w/ LRAD supervision on outdoor/indoor surfaces to demo improved community mobility    Baseline 200' on level supervision/CGA    Time 8    Status New    Target Date 03/15/21      PT LONG TERM GOAL #3   Title Pt will decrease TUG from 31.28 sec to <35 sec for improved balance and decreased fall risk.    Baseline 01/13/21 31.28 sec    Time 8    Period Weeks    Status New    Target Date 03/15/21      PT LONG TERM GOAL #4   Title Pt will increase gait speed from 0.37ms to >0.528m for improved household  mobility.    Baseline 01/13/21 0.3145m   Time 8    Period Weeks    Status New  Target Date 03/15/21      PT LONG TERM GOAL #5   Title Pt will be able to negotiate up/down ramp and curb with quad cane CGA.    Baseline not performing    Time 8    Period Weeks    Status New    Target Date 03/15/21                 Plan - 02/19/21 1637    Clinical Impression Statement Continued stretching and reviewed hamstring stretch with patient to allow completed on his own at home, has continued to not complete stretches at this time. Continued gait training working on outdoor unlevel surfaces, increased balance challenge on grass noted.    Personal Factors and Comorbidities Comorbidity 3+;Time since onset of injury/illness/exacerbation    Comorbidities Right MCA CVA with Spastic Hemiparesis 01/07/20, alchohol dependence, back pain, marijuana dependence, tobacco dependence.    Examination-Activity Limitations Transfers;Stairs;Locomotion Level;Stand    Examination-Participation Restrictions Community Activity;Cleaning    Stability/Clinical Decision Making Evolving/Moderate complexity    Rehab Potential Good    PT Frequency 2x / week   plus eval   PT Duration 8 weeks    PT Treatment/Interventions ADLs/Self Care Home Management;DME Instruction;Therapeutic activities;Stair training;Gait training;Therapeutic exercise;Balance training;Neuromuscular re-education;Patient/family education;Orthotic Fit/Training;Manual techniques;Passive range of motion;Vestibular    PT Next Visit Plan Has not worked on stretches with wife. Begin to Check LTG, very limited carryover noted with HEP.  Continue Gait training. Continue to work on weight shifting to left more with less right lateral trunk lean to clear leg. Left hip flexor and extensor strengthening.    Consulted and Agree with Plan of Care Patient           Patient will benefit from skilled therapeutic intervention in order to improve the following  deficits and impairments:  Abnormal gait,Decreased knowledge of use of DME,Decreased endurance,Decreased mobility,Decreased range of motion,Decreased strength,Impaired flexibility,Impaired tone,Impaired UE functional use,Postural dysfunction  Visit Diagnosis: Other abnormalities of gait and mobility  Muscle weakness (generalized)  Unsteadiness on feet     Problem List Patient Active Problem List   Diagnosis Date Noted  . Neuropathic pain 04/09/2020  . Pain   . Spastic hemiparesis ([place])   . History of hypertension   . Dyslipidemia   . 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 Leukocytosis 01/12/2020  . Cerebrovascular accident (CVA) due to thrombosis of right middle cerebral artery ([employer])   . Chronic pain syndrome   . Hypokalemia   . Dysphagia, post-stroke   . Acute ischemic right MCA stroke ([place]) 01/07/2020  . Tobacco dependence   . Marijuana dependence ([place])   . Alcohol dependence ([place])     Jones Bales, PT, DPT 02/19/2021, 4:39 PM  [place] [address] [place], [place], [address] Phone: [phone]   Fax:  [phone]  Name: ABDULKAREEM BADOLATO MRN: [id number] Date of Birth: 09-09-1976

## 2021-02-24 ENCOUNTER — Ambulatory Visit: Payer: BC Managed Care – PPO | Admitting: Occupational Therapy

## 2021-02-24 ENCOUNTER — Encounter: Payer: Self-pay | Admitting: Occupational Therapy

## 2021-02-24 ENCOUNTER — Other Ambulatory Visit: Payer: Self-pay

## 2021-02-24 ENCOUNTER — Ambulatory Visit: Payer: BC Managed Care – PPO

## 2021-02-24 DIAGNOSIS — I69354 Hemiplegia and hemiparesis following cerebral infarction affecting left non-dominant side: Secondary | ICD-10-CM

## 2021-02-24 DIAGNOSIS — R2681 Unsteadiness on feet: Secondary | ICD-10-CM

## 2021-02-24 DIAGNOSIS — R293 Abnormal posture: Secondary | ICD-10-CM

## 2021-02-24 DIAGNOSIS — M79602 Pain in left arm: Secondary | ICD-10-CM

## 2021-02-24 DIAGNOSIS — R41840 Attention and concentration deficit: Secondary | ICD-10-CM

## 2021-02-24 DIAGNOSIS — G8110 Spastic hemiplegia affecting unspecified side: Secondary | ICD-10-CM

## 2021-02-24 DIAGNOSIS — M6281 Muscle weakness (generalized): Secondary | ICD-10-CM

## 2021-02-24 DIAGNOSIS — R208 Other disturbances of skin sensation: Secondary | ICD-10-CM

## 2021-02-24 DIAGNOSIS — R2689 Other abnormalities of gait and mobility: Secondary | ICD-10-CM | POA: Diagnosis not present

## 2021-02-24 NOTE — Therapy (Signed)
[facility] [address] [place], [place], [address] Phone: [phone]   Fax:  [phone]  Occupational Therapy Treatment  Patient Details  Name: Derek [name] MRN: [id number] Date of Birth: May 24, 1976 Referring Provider (OT): Aliene Beams   Encounter Date: 02/24/2021   OT End of Session - 02/24/21 1700    Visit Number 10    Number of Visits 13    Date for OT Re-Evaluation 03/29/21    Authorization Type BCBS and [facility] Medicaid    OT Start Time 1605    OT Stop Time 1650    OT Time Calculation (min) 45 min    Activity Tolerance Patient tolerated treatment well    Behavior During Therapy [facility] for tasks assessed/performed           Past Medical History:  Diagnosis Date  . 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 Alcohol dependence (HCC)   . Back pain   . Marijuana dependence (HCC)   . Tobacco dependence     Past Surgical History:  Procedure Laterality Date  . BUBBLE STUDY  01/10/2020   Procedure: BUBBLE STUDY;  Surgeon: Sande Rives, MD;  Location: [facility] ENDOSCOPY;  Service: Cardiovascular;;  . TEE WITHOUT CARDIOVERSION N/A 01/10/2020   Procedure: TRANSESOPHAGEAL ECHOCARDIOGRAM (TEE);  Surgeon: Sande Rives, MD;  Location: [facility] ENDOSCOPY;  Service: Cardiovascular;  Laterality: N/A;    There were no vitals filed for this visit.   Subjective Assessment - 02/24/21 1657    Subjective  Patient reports minimal stretching of LUE at home.  Reminded patient of importance of daily self stretching    Currently in Pain? No/denies    Pain Score 0-No pain                        OT Treatments/Exercises (OP) - 02/24/21 0001      ADLs   UB Dressing Patient now able to don tshirt without physical assistance and a front opening shirt with min cueing.    Bathing Discussed purchasing a long handled sponge to assist with washing right axilla, back and lower legs.  Practiced dynamic  standing for washing bottom in standing in shower.      Neurological Re-education Exercises   Other Exercises 1 Reviewed self stretching - patient is capable of stretching, yet this is not part of his daily routine.                  OT Education - 02/24/21 1659    Education Details long handled bath sponge    Person(s) Educated Patient    Methods Explanation;Demonstration    Comprehension Verbalized understanding            OT Short Term Goals - 02/24/21 1632      OT SHORT TERM GOAL #1   Title Patient will complete a self stretching program for left upper extremity    Baseline not currently performing consistently    Time 4    Period Weeks    Status Achieved   Patient is capable, but does not complete routinely     OT SHORT TERM GOAL #2   Title Patient will dress upper body, pull over short sleeve tshirt with increased time and min assist    Baseline mod- total assist    Time 4    Period Weeks    Status Achieved      OT SHORT TERM GOAL #3   Title Patient  will report pain of less than 4/10 with forward low reaching tasks    Baseline Pain ranges toward 7-8/10    Time 4    Period Weeks    Status Achieved      OT SHORT TERM GOAL #4   Title Pt to bathe self with min assist seated    Baseline Patient  actively participates in bathing with max assist    Time 4    Period Weeks    Status On-going   Patient feels he is doing ~ 50% - working on dynamic standing to wash backside     OT SHORT TERM GOAL #5   Title Pt to don/doff a front opening shirt or jacket with mod assist    Baseline max assist    Time 4    Period Weeks    Status Achieved             OT Long Term Goals - 02/24/21 1638      OT LONG TERM GOAL #1   Title Patient will complete an updated HEP to address movement in LUE    Baseline Inconsistent performance of HEP    Time 6    Period Weeks    Status On-going      OT LONG TERM GOAL #2   Title Patient will transfer into and out of shower with  supervision    Baseline min assist    Time 6    Period Weeks    Status Achieved      OT LONG TERM GOAL #3   Title Pt to verbalize understanding with potential A/E needs and task modifications to increase ease with ADLS and simple IADLS    Baseline Unable to verbalize    Time 6    Period Weeks    Status Achieved      OT LONG TERM GOAL #4   Title Pt to be mod I level with bathing and UE dressing and min assist for LE dressing    Baseline mod-dependence    Time 6    Status On-going      OT LONG TERM GOAL #5   Title xxx                 Plan - 02/24/21 1701    Clinical Impression Statement Pt continues to progress towards long term goals. Encouraging patient to continue to do more at home with ADLs and IADLs.    OT Occupational Profile and History Detailed Assessment- Review of Records and additional review of physical, cognitive, psychosocial history related to current functional performance    Occupational performance deficits (Please refer to evaluation for details): ADL's;IADL's;Rest and Sleep    Body Structure / Function / Physical Skills ADL;Coordination;Endurance;GMC;Muscle spasms;UE functional use;Balance;Decreased knowledge of precautions;Sensation;Vision;Pain;IADL;Flexibility;Decreased knowledge of use of DME;Body mechanics;Dexterity;FMC;Proprioception;Strength;Tone;ROM;Mobility;Edema    Cognitive Skills Attention;Emotional;Energy/Drive;Learn;Memory;Sequencing;Safety Awareness;Problem Solve;Perception    Rehab Potential Fair    Clinical Decision Making Several treatment options, min-mod task modification necessary    Comorbidities Affecting Occupational Performance: May have comorbidities impacting occupational performance    Modification or Assistance to Complete Evaluation  Min-Moderate modification of tasks or assist with assess necessary to complete eval    OT Frequency 2x / week    OT Duration 6 weeks    OT Treatment/Interventions Self-care/ADL training;Aquatic  Therapy;Electrical Stimulation;Therapeutic exercise;Visual/perceptual remediation/compensation;Moist Heat;Neuromuscular education;Splinting;Patient/family education;Balance training;Therapeutic activities;Functional Mobility Training;Fluidtherapy;Ultrasound;Cryotherapy;Contrast Bath;DME and/or AE instruction;Manual Therapy;Passive range of motion;Cognitive remediation/compensation    Plan check remaining LTGs    OT Home Exercise Plan  review prior exercises    Consulted and Agree with Plan of Care Patient           Patient will benefit from skilled therapeutic intervention in order to improve the following deficits and impairments:   Body Structure / Function / Physical Skills: ADL,Coordination,Endurance,GMC,Muscle spasms,UE functional use,Balance,Decreased knowledge of precautions,Sensation,Vision,Pain,IADL,Flexibility,Decreased knowledge of use of DME,Body mechanics,Dexterity,FMC,Proprioception,Strength,Tone,ROM,Mobility,Edema Cognitive Skills: Attention,Emotional,Energy/Drive,Learn,Memory,Sequencing,Safety Awareness,Problem Solve,Perception     Visit Diagnosis: Muscle weakness (generalized)  Unsteadiness on feet  Spastic hemiplegia affecting nondominant side (HCC)  Attention and concentration deficit  Abnormal posture  Pain in left arm  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 Neuropathic pain 04/09/2020  . Pain   . Spastic hemiparesis (HCC)   . History of hypertension   . Dyslipidemia   . 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 Leukocytosis 01/12/2020  . Cerebrovascular accident (CVA) due to thrombosis of right middle cerebral artery (HCC)   . Chronic pain syndrome   . Hypokalemia   . Dysphagia, post-stroke   . 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 Tobacco  dependence   . Marijuana dependence (HCC)   . Alcohol dependence (HCC)     Collier Salina, OTR/L 02/24/2021, 5:03 PM  [facility] [address] [place], [place], [address] Phone: [phone]   Fax:  [phone]  Name: Derek [name] MRN: [id number] Date of Birth: 1976/05/15

## 2021-02-26 ENCOUNTER — Ambulatory Visit: Payer: BC Managed Care – PPO

## 2021-02-26 ENCOUNTER — Other Ambulatory Visit: Payer: Self-pay

## 2021-02-26 ENCOUNTER — Ambulatory Visit: Payer: BC Managed Care – PPO | Admitting: Occupational Therapy

## 2021-02-26 DIAGNOSIS — R293 Abnormal posture: Secondary | ICD-10-CM

## 2021-02-26 DIAGNOSIS — M6281 Muscle weakness (generalized): Secondary | ICD-10-CM

## 2021-02-26 DIAGNOSIS — R2681 Unsteadiness on feet: Secondary | ICD-10-CM | POA: Diagnosis not present

## 2021-02-26 DIAGNOSIS — G8110 Spastic hemiplegia affecting unspecified side: Secondary | ICD-10-CM | POA: Diagnosis not present

## 2021-02-26 DIAGNOSIS — I69354 Hemiplegia and hemiparesis following cerebral infarction affecting left non-dominant side: Secondary | ICD-10-CM | POA: Diagnosis not present

## 2021-02-26 DIAGNOSIS — R2689 Other abnormalities of gait and mobility: Secondary | ICD-10-CM | POA: Diagnosis not present

## 2021-02-26 DIAGNOSIS — M79602 Pain in left arm: Secondary | ICD-10-CM | POA: Diagnosis not present

## 2021-02-26 DIAGNOSIS — R41840 Attention and concentration deficit: Secondary | ICD-10-CM

## 2021-02-26 DIAGNOSIS — R208 Other disturbances of skin sensation: Secondary | ICD-10-CM | POA: Diagnosis not present

## 2021-02-26 NOTE — Therapy (Signed)
[facility] [address] [place], [place], [address] Phone: [phone]   Fax:  [phone]  Physical Therapy Treatment  Patient Details  Name: Derek [name] MRN: [id number] Date of Birth: 01-Oct-1976 Referring Provider (PT): [name]   Encounter Date: 02/26/2021   PT End of Session - 02/26/21 1536    Visit Number 12    Number of Visits 17    Authorization Type BCBS with medicaid secondary    PT Start Time 1530    PT Stop Time 1615    PT Time Calculation (min) 45 min    Equipment Utilized During Treatment Gait belt    Activity Tolerance Patient tolerated treatment well    Behavior During Therapy Flat affect           Past Medical History:  Diagnosis Date  . Acute ischemic right MCA stroke ([place]) 01/07/2020  . Alcohol dependence ([place])   . Back pain   . Marijuana dependence ([place])   . Tobacco dependence     Past Surgical History:  Procedure Laterality Date  . BUBBLE STUDY  01/10/2020   Procedure: BUBBLE STUDY;  Surgeon: Geralynn Rile, MD;  Location: [place];  Service: Cardiovascular;;  . TEE WITHOUT CARDIOVERSION N/A 01/10/2020   Procedure: TRANSESOPHAGEAL ECHOCARDIOGRAM (TEE);  Surgeon: Geralynn Rile, MD;  Location: [place];  Service: Cardiovascular;  Laterality: N/A;    There were no vitals filed for this visit.   Subjective Assessment - 02/26/21 1534    Subjective Patient reports no new changes. Reports he is trying to stretch 1x/day. Brought chair cushion in today because the surfaces hurt to sit in. No falls.    Patient Stated Goals Pt would like to work on improving left leg strength, improve bend in it, to make it easier to walk.    Currently in Pain? Yes    Pain Score 4     Pain Location Back    Pain Orientation Lower    Pain Descriptors / Indicators Aching    Pain Type Chronic pain    Pain Onset In the past 7 days    Pain Frequency Intermittent    Aggravating  Factors  stretching                 [facility] Adult PT Treatment/Exercise - 02/26/21 0001      Transfers   Transfers Sit to Stand;Stand to Sit    Sit to Stand 5: Supervision    Stand to Sit 5: Supervision      Ambulation/Gait   Ambulation/Gait Yes    Ambulation/Gait Assistance 5: Supervision    Ambulation/Gait Assistance Details Completed ambulation outdoors on paved surface x 1200 ft (took patient approx 15 minutes) to complete. Supervision throughout. Mild fatigue/dhakiness noted in LLE at end of completion. PT providing intermittent tactile cues for reduced trunk lean and improved posture    Ambulation Distance (Feet) 1200 Feet    Assistive device Large base quad cane    Gait Pattern Step-through pattern;Decreased hip/knee flexion - left    Ambulation Surface Level;Indoor      High Level Balance   High Level Balance Activities Negotiating over obstacles    High Level Balance Comments Completed ambulation with [employer] and working on negotation over orange hurdles x 3 laps down and back. Increased challenge with LLE trailing, able to demo improved hip/knee flexion and completion when leading with LLE. PT providing tactile cues to avoid circumduction with completion.  Exercises   Exercises Knee/Hip      Knee/Hip Exercises: Aerobic   Other Aerobic With BLE only completed SciFit on Level 2.0 x 7 minutes for strengthening and reciprocal movement. Intermittent tactile/verbal cues to maintain alignment with LLE, able to demo improvements with cues.              PT Short Term Goals - 02/12/21 1537      PT SHORT TERM GOAL #1   Title Pt will be independent with HEP for strengthening, flexibility and balance.    Baseline not completing HEP consistently    Time 4    Period Weeks    Status On-going    Target Date 02/13/21      PT SHORT TERM GOAL #2   Title Pt will ambulate >300' on level surfaces with quad cane mod I with decreased right trunk lean for improved household  and short community distances.    Baseline 200' supervision/CGA with quad cane; 250' with supervision continue to demo right trunk lean with ambulation    Time 4    Period Weeks    Status Partially Met    Target Date 02/13/21      PT SHORT TERM GOAL #3   Title Pt will decrease 5 x sit to stand from 39.79 sec to <35 sec for improved balance and functional strength.    Baseline 01/13/21 39.79 sec from w/c with hand; 28.76 secs from mat    Time 4    Period Weeks    Status Achieved    Target Date 02/13/21             PT Long Term Goals - 02/26/21 1629      PT LONG TERM GOAL #1   Title Pt will be able to walk >10 minutes in therapy for improved activity tolerance and mobility.    Baseline 15 minutes (outdoors)    Time 8    Period Weeks    Status Achieved      PT LONG TERM GOAL #2   Title Patient will demonstrate ability to ambulate > 600 ft w/ LRAD supervision on outdoor/indoor surfaces to demo improved community mobility    Baseline 200' on level supervision/CGA; 1200 ft outdoors with supervision LBQC    Time 8    Status Achieved      PT LONG TERM GOAL #3   Title Pt will decrease TUG from 31.28 sec to <35 sec for improved balance and decreased fall risk.    Baseline 01/13/21 31.28 sec    Time 8    Period Weeks    Status New      PT LONG TERM GOAL #4   Title Pt will increase gait speed from 0.3ms to >0.556m for improved household mobility.    Baseline 01/13/21 0.3182m   Time 8    Period Weeks    Status New      PT LONG TERM GOAL #5   Title Pt will be able to negotiate up/down ramp and curb with quad cane CGA.    Baseline not performing    Time 8    Period Weeks    Status New                 Plan - 02/26/21 1628    Clinical Impression Statement Completed gait outdoors with LBQC x 15 minutes/1200 ft approx, supervision throughout. Patient demo ability to meet LTG #1 and #2 today.  Mild fatigue noted at end. Continue to require  cues for posture during  ambulation. Will continue to progress toward all LTGs.    Personal Factors and Comorbidities Comorbidity 3+;Time since onset of injury/illness/exacerbation    Comorbidities Right MCA CVA with Spastic Hemiparesis 01/07/20, alchohol dependence, back pain, marijuana dependence, tobacco dependence.    Examination-Activity Limitations Transfers;Stairs;Locomotion Level;Stand    Examination-Participation Restrictions Community Activity;Cleaning    Stability/Clinical Decision Making Evolving/Moderate complexity    Rehab Potential Good    PT Frequency 2x / week   plus eval   PT Duration 8 weeks    PT Treatment/Interventions ADLs/Self Care Home Management;DME Instruction;Therapeutic activities;Stair training;Gait training;Therapeutic exercise;Balance training;Neuromuscular re-education;Patient/family education;Orthotic Fit/Training;Manual techniques;Passive range of motion;Vestibular    PT Next Visit Plan Has not worked on stretches with wife. Finish checking LTG + D/C, very limited carryover noted with HEP.  Continue Gait training. Continue to work on weight shifting to left more with less right lateral trunk lean to clear leg. Left hip flexor and extensor strengthening.    Consulted and Agree with Plan of Care Patient           Patient will benefit from skilled therapeutic intervention in order to improve the following deficits and impairments:  Abnormal gait,Decreased knowledge of use of DME,Decreased endurance,Decreased mobility,Decreased range of motion,Decreased strength,Impaired flexibility,Impaired tone,Impaired UE functional use,Postural dysfunction  Visit Diagnosis: Muscle weakness (generalized)  Unsteadiness on feet  Muscle weakness  Abnormal posture     Problem List Patient Active Problem List   Diagnosis Date Noted  . Neuropathic pain 04/09/2020  . Pain   . Spastic hemiparesis ([place])   . History of hypertension   . Dyslipidemia   . Right middle cerebral artery stroke ([place])  01/15/2020  . Leukocytosis 01/12/2020  . Cerebrovascular accident (CVA) due to thrombosis of right middle cerebral artery ([place])   . Chronic pain syndrome   . Hypokalemia   . Dysphagia, post-stroke   . Acute ischemic right MCA stroke ([place]) 01/07/2020  . Tobacco dependence   . Marijuana dependence ([place])   . Alcohol dependence ([place])     Jones Bales, PT, DPT 02/26/2021, 4:33 PM  [facility] [address] [place], [place], [address] Phone: [phone]   Fax:  [phone]  Name: STORM SOVINE MRN: [id number] Date of Birth: 08-18-1976

## 2021-02-26 NOTE — Therapy (Signed)
[facility] [address] [place], [place], [address] Phone: [phone]   Fax:  [phone]  Occupational Therapy Treatment & Discharge  Patient Details  Name: Derek [name] MRN: [id number] Date of Birth: [birth year] Referring Provider (OT): [name]   Encounter Date: 02/26/2021   OT End of Session - 02/26/21 1617    Visit Number 11    Number of Visits 13    Date for OT Re-Evaluation 03/29/21    Authorization Type BCBS and [facility] Medicaid    OT Start Time 1616    OT Stop Time 1700    OT Time Calculation (min) 44 min    Activity Tolerance Patient tolerated treatment well    Behavior During Therapy [facility] for tasks assessed/performed           Past Medical History:  Diagnosis Date  . Acute ischemic right MCA stroke ([place]) 01/07/2020  . Alcohol dependence ([place])   . Back pain   . Marijuana dependence ([place])   . Tobacco dependence     Past Surgical History:  Procedure Laterality Date  . BUBBLE STUDY  01/10/2020   Procedure: BUBBLE STUDY;  Surgeon: Geralynn Rile, MD;  Location: [facility];  Service: Cardiovascular;;  . TEE WITHOUT CARDIOVERSION N/A 01/10/2020   Procedure: TRANSESOPHAGEAL ECHOCARDIOGRAM (TEE);  Surgeon: Geralynn Rile, MD;  Location: [facility];  Service: Cardiovascular;  Laterality: N/A;    There were no vitals filed for this visit.   Subjective Assessment - 02/26/21 1635    Subjective  Pt reports his back is hurting some today from trying some stretches at home.    Currently in Pain? Yes    Pain Score 3     Pain Location Back    Pain Descriptors / Indicators Aching    Pain Type Acute pain    Pain Onset Yesterday    Pain Frequency Rarely    Aggravating Factors  stretching           OCCUPATIONAL THERAPY DISCHARGE SUMMARY  Visits from Start of Care: 11  Current functional level related to goals / functional outcomes: Pt has met a lot of goals and increased  independence and safety with ADLs.    Remaining deficits: Pt continues with significant tone and spasticity in LUE and requiring assistance for bathing and LE dressing   Education / Equipment: HEPs and self-PROM stretches  Plan: Patient agrees to discharge.  Patient goals were partially met. Patient is being discharged due to being pleased with the current functional level.  ?????         Reviewed and pt demonstrating Self-PROM.                  OT Education - 02/26/21 1712    Education Details Education provided on the importance of continuing stretching for LUE in supine and in sitting - pt demonstrated understanding of stretches. Pt encouraged to continue working on practicing ADLs and increasing independence.    Person(s) Educated Patient    Methods Explanation;Demonstration    Comprehension Verbalized understanding;Returned demonstration            OT Short Term Goals - 02/26/21 1648      OT SHORT TERM GOAL #1   Title Patient will complete a self stretching program for left upper extremity    Baseline not currently performing consistently    Time 4    Period Weeks    Status Achieved   Patient is capable, but does not  complete routinely     OT SHORT TERM GOAL #2   Title Patient will dress upper body, pull over short sleeve tshirt with increased time and min assist    Baseline mod- total assist    Time 4    Period Weeks    Status Achieved      OT SHORT TERM GOAL #3   Title Patient will report pain of less than 4/10 with forward low reaching tasks    Baseline Pain ranges toward 7-8/10    Time 4    Period Weeks    Status Achieved      OT SHORT TERM GOAL #4   Title Pt to bathe self with min assist seated    Baseline Patient  actively participates in bathing with max assist    Time 4    Period Weeks    Status Not Met   pt able to simulate bathing with min A in clinic with long handled sponge. Still reports needing 50% assistance.     OT SHORT  TERM GOAL #5   Title Pt to don/doff a front opening shirt or jacket with mod assist    Baseline max assist    Time 4    Period Weeks    Status Achieved             OT Long Term Goals - 02/26/21 1627      OT LONG TERM GOAL #1   Title Patient will complete an updated HEP to address movement in LUE    Baseline Inconsistent performance of HEP    Time 6    Period Weeks    Status Achieved      OT LONG TERM GOAL #2   Title Patient will transfer into and out of shower with supervision    Baseline min assist    Time 6    Period Weeks    Status Achieved      OT LONG TERM GOAL #3   Title Pt to verbalize understanding with potential A/E needs and task modifications to increase ease with ADLS and simple IADLS    Baseline Unable to verbalize    Time 6    Period Weeks    Status Achieved      OT LONG TERM GOAL #4   Title Pt to be mod I level with bathing and UE dressing and min assist for LE dressing    Baseline mod-dependence    Time 6    Status Partially Met   LE dressing with min A - bathing stil requiring min A     OT LONG TERM GOAL #5   Title xxx                 Plan - 02/26/21 1713    Clinical Impression Statement Pt has made good progress towards ADLs and IADLs and is encouraged to continue practicing these activities at home. Pt encouraged to continue stretching daily. Pt discharged from OT this day.    OT Occupational Profile and History Detailed Assessment- Review of Records and additional review of physical, cognitive, psychosocial history related to current functional performance    Occupational performance deficits (Please refer to evaluation for details): ADL's;IADL's;Rest and Sleep    Body Structure / Function / Physical Skills ADL;Coordination;Endurance;GMC;Muscle spasms;UE functional use;Balance;Decreased knowledge of precautions;Sensation;Vision;Pain;IADL;Flexibility;Decreased knowledge of use of DME;Body  mechanics;Dexterity;FMC;Proprioception;Strength;Tone;ROM;Mobility;Edema    Cognitive Skills Attention;Emotional;Energy/Drive;Learn;Memory;Sequencing;Safety Awareness;Problem Solve;Perception    Rehab Potential Fair    Clinical Decision Making Several treatment options, min-mod  task modification necessary    Comorbidities Affecting Occupational Performance: May have comorbidities impacting occupational performance    Modification or Assistance to Complete Evaluation  Min-Moderate modification of tasks or assist with assess necessary to complete eval    OT Frequency 2x / week    OT Duration 6 weeks    OT Treatment/Interventions Self-care/ADL training;Aquatic Therapy;Electrical Stimulation;Therapeutic exercise;Visual/perceptual remediation/compensation;Moist Heat;Neuromuscular education;Splinting;Patient/family education;Balance training;Therapeutic activities;Functional Mobility Training;Fluidtherapy;Ultrasound;Cryotherapy;Contrast Bath;DME and/or AE instruction;Manual Therapy;Passive range of motion;Cognitive remediation/compensation    Plan discharge OT    OT Home Exercise Plan review prior exercises    Consulted and Agree with Plan of Care Patient           Patient will benefit from skilled therapeutic intervention in order to improve the following deficits and impairments:   Body Structure / Function / Physical Skills: ADL,Coordination,Endurance,GMC,Muscle spasms,UE functional use,Balance,Decreased knowledge of precautions,Sensation,Vision,Pain,IADL,Flexibility,Decreased knowledge of use of DME,Body mechanics,Dexterity,FMC,Proprioception,Strength,Tone,ROM,Mobility,Edema Cognitive Skills: Attention,Emotional,Energy/Drive,Learn,Memory,Sequencing,Safety Awareness,Problem Solve,Perception     Visit Diagnosis: Muscle weakness (generalized)  Unsteadiness on feet  Spastic hemiplegia affecting nondominant side (HCC)  Attention and concentration deficit  Spastic hemiplegia of left  nondominant 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 [facility])    Problem List Patient Active Problem List   Diagnosis Date Noted  . Neuropathic pain 04/09/2020  . Pain   . Spastic hemiparesis ([place])   . History of hypertension   . Dyslipidemia   . 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 Leukocytosis 01/12/2020  . Cerebrovascular accident (CVA) due to thrombosis of right middle cerebral artery ([place])   . Chronic pain syndrome   . Hypokalemia   . Dysphagia, post-stroke   . Acute ischemic right MCA stroke ([place]) 01/07/2020  . Tobacco dependence   . Marijuana dependence ([place])   . Alcohol dependence ([place])     Zachery Conch MOT, OTR/L  02/26/2021, 5:14 PM  [facility] [address] [place], [place], [address] Phone: [phone]   Fax:  [phone]  Name: Derek [name] MRN: [id number] Date of Birth: 09-27-1976

## 2021-03-03 ENCOUNTER — Ambulatory Visit: Payer: BC Managed Care – PPO | Attending: Family Medicine

## 2021-03-03 ENCOUNTER — Other Ambulatory Visit: Payer: Self-pay

## 2021-03-03 DIAGNOSIS — M6281 Muscle weakness (generalized): Secondary | ICD-10-CM | POA: Diagnosis not present

## 2021-03-03 DIAGNOSIS — R2689 Other abnormalities of gait and mobility: Secondary | ICD-10-CM | POA: Insufficient documentation

## 2021-03-03 NOTE — Therapy (Signed)
[facility] [address] [place], [place], [address] Phone: [phone]   Fax:  [phone]  Physical Therapy Treatment/Discharge Summary  Patient Details  Name: Derek [name] MRN: [id number] Date of Birth: 10-23-1976 Referring Provider (PT): [name]  PHYSICAL THERAPY DISCHARGE SUMMARY  Visits from Start of Care: 13  Current functional level related to goals / functional outcomes: See clinical impression and goals for more information. Pt is ambulating in community with large based quad cane.   Remaining deficits: Left hemiparesis    Education / Equipment: HEP  Plan: Patient agrees to discharge.  Patient goals were partially met. Patient is being discharged due to meeting the stated rehab goals.  ?????         Encounter Date: 03/03/2021   PT End of Session - 03/03/21 1325    Visit Number 13    Number of Visits 17    Authorization Type BCBS with medicaid secondary    PT Start Time 1322    PT Stop Time 1402    PT Time Calculation (min) 40 min    Equipment Utilized During Treatment Gait belt    Activity Tolerance Patient tolerated treatment well    Behavior During Therapy Flat affect           Past Medical History:  Diagnosis Date  . Acute ischemic right MCA stroke ([place]) 01/07/2020  . Alcohol dependence ([place])   . Back pain   . Marijuana dependence ([place])   . Tobacco dependence     Past Surgical History:  Procedure Laterality Date  . BUBBLE STUDY  01/10/2020   Procedure: BUBBLE STUDY;  Surgeon: Geralynn Rile, MD;  Location: [facility];  Service: Cardiovascular;;  . TEE WITHOUT CARDIOVERSION N/A 01/10/2020   Procedure: TRANSESOPHAGEAL ECHOCARDIOGRAM (TEE);  Surgeon: Geralynn Rile, MD;  Location: [facility];  Service: Cardiovascular;  Laterality: N/A;    There were no vitals filed for this visit.   Subjective Assessment - 03/03/21 1325    Subjective Pt reports that he  has been laying in bed most of the day. Just wasn't feeling like doing much.    Patient Stated Goals Pt would like to work on improving left leg strength, improve bend in it, to make it easier to walk.    Currently in Pain? No/denies    Pain Onset In the past 7 days                             [facility] Adult PT Treatment/Exercise - 03/03/21 1327      Ambulation/Gait   Ambulation/Gait Yes    Ambulation/Gait Assistance 6: Modified independent (Device/Increase time)    Ambulation Distance (Feet) 230 Feet    Assistive device Large base quad cane    Gait Pattern Step-through pattern;Decreased hip/knee flexion - left    Ambulation Surface Level;Indoor    Gait velocity 27.52 sec=0.34ms    Ramp 5: Supervision;6: Modified independent (Device)    Ramp Details (indicate cue type and reason) x 2 bouts with quad cane    Curb 5: Supervision;6: Modified independent (Device/increase time)    Curb Details (indicate cue type and reason) performed x 2 with supervision first bout and mod I second bout. Leads up with RLE and down with RLE      Standardized Balance Assessment   Standardized Balance Assessment Timed Up and Go Test      Timed Up and Go Test  TUG Normal TUG    Normal TUG (seconds) 24.1   24.51 sec and 23.8 sec     Exercises   Exercises Other Exercises    Other Exercises  PT reviewed HEP as noted below and updated stretching to be able to perform more on own.          Reviewed the following exercises performing a set of 10 of the bolded strengthening exercises and 30 sec x 2 on stretches:  Exercises Supine Bridge - 2 x daily - 5 x weekly - 2 sets - 8 reps Bent Knee Fallouts - 2 x daily - 5 x weekly - 2 sets - 8 reps Supine Heel Slide - 2 x daily - 5 x weekly - 2 sets - 8 reps Seated Heel Slide - 1 x daily - 5 x weekly - 2 sets - 8 reps Sit to Stand with Armchair - 1 x daily - 5 x weekly - 3 sets - 5 reps Side to Side Weight Shift with Counter Support - 2 x daily - 7  x weekly - 2 sets - 10 reps Supine March with Resistance Band - 1 x daily - 7 x weekly - 3 sets - 10 reps Supine Hamstring Stretch with Caregiver - 3 x daily - 7 x weekly - 1 sets - 4 reps - 30 sec hold Hip flexion/extension with resistance - 3 x daily - 7 x weekly - 1 sets - 4 reps - 30 sec hold Supine Ankle Dorsiflexion Stretch with Caregiver - 3 x daily - 7 x weekly - 1 sets - 4 reps - 30 sec hold Standing Gastroc Stretch at Counter - 3 x daily - 7 x weekly - 1 sets - 4 reps - 30 sec hold Seated Hamstring Stretch - 3 x daily - 7 x weekly - 1 sets - 3 reps - 30 sec hold Seated Gastroc Stretch with Strap - 3 x daily - 7 x weekly - 1 sets - 3 reps - 30 sec hold        PT Education - 03/03/21 1557    Education Details Added seated hamstring stretch and DF stretch with sheet for pt to be able to perform more on own. Discussed results of testing and proceeding with d/c as planned.    Person(s) Educated Patient    Methods Explanation;Demonstration;Handout    Comprehension Verbalized understanding;Returned demonstration            PT Short Term Goals - 03/03/21 1558      PT SHORT TERM GOAL #1   Title Pt will be independent with HEP for strengthening, flexibility and balance.    Baseline not completing HEP consistently. 03/03/21 Pt has been issued HEP and able to perform. Needs to work on improving consistency.    Time 4    Period Weeks    Status Achieved    Target Date 02/13/21      PT SHORT TERM GOAL #2   Title Pt will ambulate >300' on level surfaces with quad cane mod I with decreased right trunk lean for improved household and short community distances.    Baseline 200' supervision/CGA with quad cane; 250' with supervision continue to demo right trunk lean with ambulation. 03/03/21  Pt is mod I on level surfaces and has more upright posture.    Time 4    Period Weeks    Status Achieved    Target Date 02/13/21      PT SHORT TERM GOAL #3  Title Pt will decrease 5 x sit to stand  from 39.79 sec to <35 sec for improved balance and functional strength.    Baseline 01/13/21 39.79 sec from w/c with hand; 28.76 secs from mat    Time 4    Period Weeks    Status Achieved    Target Date 02/13/21             PT Long Term Goals - 03/03/21 1335      PT LONG TERM GOAL #1   Title Pt will be able to walk >10 minutes in therapy for improved activity tolerance and mobility.    Baseline 15 minutes (outdoors)    Time 8    Period Weeks    Status Achieved      PT LONG TERM GOAL #2   Title Patient will demonstrate ability to ambulate > 600 ft w/ LRAD supervision on outdoor/indoor surfaces to demo improved community mobility    Baseline 200' on level supervision/CGA; 1200 ft outdoors with supervision [facility]    Time 8    Status Achieved      PT LONG TERM GOAL #3   Title Pt will decrease TUG from 31.28 sec to <25 sec for improved balance and decreased fall risk.    Baseline 01/13/21 31.28 sec. 03/03/21 24.1 sec    Time 8    Period Weeks    Status Achieved      PT LONG TERM GOAL #4   Title Pt will increase gait speed from 0.51ms to >0.541m for improved household mobility.    Baseline 01/13/21 0.3130m 03/03/21 0.40m78m  Time 8    Period Weeks    Status Not Met      PT LONG TERM GOAL #5   Title Pt will be able to negotiate up/down ramp and curb with quad cane CGA.    Baseline 03/03/21 mod I/supervision on ramp and up/down curb with quad cane.    Time 8    Period Weeks    Status Achieved                 Plan - 03/03/21 1559    Clinical Impression Statement PT assessed remaining goals today. Pt met TUG goal decreasing time to 24.1 sec showing improving balance. His gait speed increased to 0.40m/59mt was still short of goal. Showing improving gait quality with less lean to the right and better left hip flexion to advance foot. Pt is supervision/mod on ramp and for curb negotation reaching this goals. PT adjusted HEP to modify stretches so that he could perform without  wife assistance as reports it is hard to get help. Has not been consistent with HEP performance and PT stressed importance of working on exercises at home. PT proceeding with discharge as planned.    Personal Factors and Comorbidities Comorbidity 3+;Time since onset of injury/illness/exacerbation    Comorbidities Right MCA CVA with Spastic Hemiparesis 01/07/20, alchohol dependence, back pain, marijuana dependence, tobacco dependence.    Examination-Activity Limitations Transfers;Stairs;Locomotion Level;Stand    Examination-Participation Restrictions Community Activity;Cleaning    Stability/Clinical Decision Making Evolving/Moderate complexity    Rehab Potential Good    PT Frequency 2x / week   plus eval   PT Duration 8 weeks    PT Treatment/Interventions ADLs/Self Care Home Management;DME Instruction;Therapeutic activities;Stair training;Gait training;Therapeutic exercise;Balance training;Neuromuscular re-education;Patient/family education;Orthotic Fit/Training;Manual techniques;Passive range of motion;Vestibular    PT Next Visit Plan Discharging today    Consulted and Agree with Plan of Care Patient  Patient will benefit from skilled therapeutic intervention in order to improve the following deficits and impairments:  Abnormal gait,Decreased knowledge of use of DME,Decreased endurance,Decreased mobility,Decreased range of motion,Decreased strength,Impaired flexibility,Impaired tone,Impaired UE functional use,Postural dysfunction  Visit Diagnosis: Other abnormalities of gait and mobility  Muscle weakness (generalized)     Problem List Patient Active Problem List   Diagnosis Date Noted  . Neuropathic pain 04/09/2020  . Pain   . Spastic hemiparesis ([place])   . History of hypertension   . Dyslipidemia   . 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 Leukocytosis 01/12/2020  . Cerebrovascular accident (CVA) due to thrombosis of right middle cerebral artery ([place])   .  Chronic pain syndrome   . Hypokalemia   . Dysphagia, post-stroke   . Acute ischemic right MCA stroke ([place]) 01/07/2020  . Tobacco dependence   . Marijuana dependence ([place])   . Alcohol dependence ([place])     Electa Sniff, PT, DPT, NCS 03/03/2021, 4:03 PM  [facility] [address] [place], [place], [address] Phone: [phone]   Fax:  [phone]  Name: Derek [name] MRN: [id number] Date of Birth: [birth year]

## 2021-03-03 NOTE — Patient Instructions (Signed)
Access Code: [email or web address] URL: [email or web address] Date: 03/03/2021 Prepared by: Elmer Bales  Exercises Supine Bridge - 2 x daily - 5 x weekly - 2 sets - 8 reps Bent Knee Fallouts - 2 x daily - 5 x weekly - 2 sets - 8 reps Supine Heel Slide - 2 x daily - 5 x weekly - 2 sets - 8 reps Seated Heel Slide - 1 x daily - 5 x weekly - 2 sets - 8 reps Sit to Stand with Armchair - 1 x daily - 5 x weekly - 3 sets - 5 reps Side to Side Weight Shift with Counter Support - 2 x daily - 7 x weekly - 2 sets - 10 reps Supine March with Resistance Band - 1 x daily - 7 x weekly - 3 sets - 10 reps Supine Hamstring Stretch with Caregiver - 3 x daily - 7 x weekly - 1 sets - 4 reps - 30 sec hold Hip flexion/extension with resistance - 3 x daily - 7 x weekly - 1 sets - 4 reps - 30 sec hold Supine Ankle Dorsiflexion Stretch with Caregiver - 3 x daily - 7 x weekly - 1 sets - 4 reps - 30 sec hold Standing Gastroc Stretch at Counter - 3 x daily - 7 x weekly - 1 sets - 4 reps - 30 sec hold Seated Hamstring Stretch - 3 x daily - 7 x weekly - 1 sets - 3 reps - 30 sec hold Seated Gastroc Stretch with Strap - 3 x daily - 7 x weekly - 1 sets - 3 reps - 30 sec hold

## 2021-03-04 ENCOUNTER — Telehealth: Payer: Self-pay | Admitting: *Deleted

## 2021-03-04 NOTE — Telephone Encounter (Signed)
[facility] called from [facility] outpt rehab called to request a handicap placard for Mr Labarron, Durn[place] to #[phone]

## 2021-03-08 ENCOUNTER — Other Ambulatory Visit: Payer: Self-pay | Admitting: Physical Medicine & Rehabilitation

## 2021-03-08 DIAGNOSIS — I63511 Cerebral infarction due to unspecified occlusion or stenosis of right middle cerebral artery: Secondary | ICD-10-CM

## 2021-03-16 ENCOUNTER — Other Ambulatory Visit: Payer: Self-pay | Admitting: Physical Medicine & Rehabilitation

## 2021-03-20 ENCOUNTER — Other Ambulatory Visit: Payer: Self-pay | Admitting: Physical Medicine & Rehabilitation

## 2021-04-19 ENCOUNTER — Other Ambulatory Visit: Payer: Self-pay | Admitting: Physical Medicine & Rehabilitation

## 2021-04-19 DIAGNOSIS — G8110 Spastic hemiplegia affecting unspecified side: Secondary | ICD-10-CM

## 2021-04-26 ENCOUNTER — Other Ambulatory Visit: Payer: Self-pay | Admitting: Physical Medicine & Rehabilitation

## 2021-04-26 DIAGNOSIS — G8110 Spastic hemiplegia affecting unspecified side: Secondary | ICD-10-CM

## 2021-04-28 ENCOUNTER — Other Ambulatory Visit: Payer: Self-pay | Admitting: Physical Medicine & Rehabilitation

## 2021-04-28 DIAGNOSIS — G8110 Spastic hemiplegia affecting unspecified side: Secondary | ICD-10-CM

## 2021-04-29 DIAGNOSIS — E7800 Pure hypercholesterolemia, unspecified: Secondary | ICD-10-CM | POA: Diagnosis not present

## 2021-04-29 DIAGNOSIS — I10 Essential (primary) hypertension: Secondary | ICD-10-CM | POA: Diagnosis not present

## 2021-04-29 DIAGNOSIS — I69354 Hemiplegia and hemiparesis following cerebral infarction affecting left non-dominant side: Secondary | ICD-10-CM | POA: Diagnosis not present

## 2021-04-29 DIAGNOSIS — I6930 Unspecified sequelae of cerebral infarction: Secondary | ICD-10-CM | POA: Diagnosis not present

## 2021-05-13 ENCOUNTER — Other Ambulatory Visit: Payer: Self-pay

## 2021-05-13 ENCOUNTER — Encounter
Payer: BC Managed Care – PPO | Attending: Physical Medicine & Rehabilitation | Admitting: Physical Medicine & Rehabilitation

## 2021-05-13 ENCOUNTER — Encounter: Payer: Self-pay | Admitting: Physical Medicine & Rehabilitation

## 2021-05-13 VITALS — BP 128/88 | HR 77 | Temp 98.8°F | Ht 71.0 in | Wt 238.0 lb

## 2021-05-13 DIAGNOSIS — G8110 Spastic hemiplegia affecting unspecified side: Secondary | ICD-10-CM | POA: Diagnosis not present

## 2021-05-13 DIAGNOSIS — I63511 Cerebral infarction due to unspecified occlusion or stenosis of right middle cerebral artery: Secondary | ICD-10-CM | POA: Diagnosis not present

## 2021-05-13 DIAGNOSIS — G894 Chronic pain syndrome: Secondary | ICD-10-CM | POA: Diagnosis not present

## 2021-05-13 MED ORDER — OXYCODONE HCL 5 MG PO TABS: 5.0000 mg | ORAL_TABLET | ORAL | 0 refills | Status: DC | PRN

## 2021-05-13 MED ORDER — LIDOCAINE 5 % EX PTCH: 2.0000 | MEDICATED_PATCH | CUTANEOUS | 5 refills | Status: DC

## 2021-05-13 NOTE — Patient Instructions (Signed)
PLEASE FEEL FREE TO CALL OUR OFFICE WITH ANY PROBLEMS OR QUESTIONS ([phone])      

## 2021-05-13 NOTE — Progress Notes (Addendum)
Subjective:    Patient ID: Derek [name], male    DOB: 17-Jun-1976, 45 y.o.   MRN: [id number]  HPI Derek [name] is here in follow-up of his right MCA infarct.  I last saw him in April we performed Botox injections to the left upper extremity including the pectoralis major and minor and wrist and finger flexors totaling 400 units. He had good effects with the injections but feels the effects are starting to wane. He finsihed therapies in April/May. He's not doing stretches daily at home. He needs cueing from his wife frequently to start them. When I asked how often he was doing them, his wife said he really wasn't.   He is wearing a shoulder harness for support which does seem to help. He uses oxycodone for severe pain. Lidoderm patches help somewhat. He remains on baclofen and tizanidine for spasticity control as well.    Pain Inventory Average Pain 5 Pain Right Now 0 My pain is intermittent and aching  LOCATION OF PAIN  left shoulder, left knee, left hip  BOWEL Number of stools per week: 7 Oral laxative use No  Type of laxative none Enema or suppository use No  History of colostomy NO Incontinent No   BLADDER Normal In and out cath, frequency N/A Able to self cath No  Bladder incontinence No  Frequent urination No  Leakage with coughing No  Difficulty starting stream No  Incomplete bladder emptying No    Mobility use a walker how many minutes can you walk? unknown ability to climb steps?  yes do you drive?  no use a wheelchair transfers alone Do you have any goals in this area?  yes  Function disabled: date disabled 2021 I need assistance with the following:  feeding, dressing, bathing, toileting, meal prep, household duties, and shopping Do you have any goals in this area?  yes  Neuro/Psych weakness numbness trouble walking depression anxiety  Prior Studies Any changes since last visit?  no  Physicians involved in your care Any changes since last visit?   no   Family History  Problem Relation Age of Onset   Stroke Neg Hx    CAD Neg Hx    Social History   Socioeconomic History   Marital status: Married    Spouse name: Not on file   Number of children: Not on file   Years of education: Not on file   Highest education level: Not on file  Occupational History   Occupation: truck driver  Tobacco Use   Smoking status: Every Day    Packs/day: 0.20    Pack years: 0.00    Types: Cigarettes   Smokeless tobacco: Never  Vaping Use   Vaping Use: Never used  Substance and Sexual Activity   Alcohol use: Yes    Comment: 2-3 times a week.    Drug use: Yes    Types: Marijuana   Sexual activity: Not Currently    Birth control/protection: Condom  Other Topics Concern   Not on file  Social History Narrative   Not on file   Social Determinants of Health   Financial Resource Strain: Not on file  Food Insecurity: Not on file  Transportation Needs: Not on file  Physical Activity: Not on file  Stress: Not on file  Social Connections: Not on file   Past Surgical History:  Procedure Laterality Date   BUBBLE STUDY  01/10/2020   Procedure: BUBBLE STUDY;  Surgeon: Sande Rives, MD;  Location: [facility] ENDOSCOPY;  Service: Cardiovascular;;   TEE WITHOUT CARDIOVERSION N/A 01/10/2020   Procedure: TRANSESOPHAGEAL ECHOCARDIOGRAM (TEE);  Surgeon: Sande Rives, MD;  Location: [facility] ENDOSCOPY;  Service: Cardiovascular;  Laterality: N/A;   Past Medical History:  Diagnosis Date   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/07/2020   Alcohol dependence (HCC)    Back pain    Marijuana dependence (HCC)    Tobacco dependence    BP 128/88   Pulse 77   Temp 98.8 F (37.1 C)   Ht 5\' 11"  (1.803 m)   Wt 238 lb (108 kg) Comment: per pt in wc  BMI 33.19 kg/m   Opioid Risk Score:   Fall Risk Score:  `1  Depression screen PHQ 2/9  Depression screen [facility] 2/9 06/18/2020 02/27/2020  Decreased Interest 0 0  Down, Depressed, Hopeless 0 0  PHQ - 2  Score 0 0  Altered sleeping - 0  Tired, decreased energy - 1  Change in appetite - 0  Feeling bad or failure about yourself  - 0  Trouble concentrating - 0  Moving slowly or fidgety/restless - 0  Suicidal thoughts - 0  PHQ-9 Score - 1  Difficult doing work/chores - Somewhat difficult    Review of Systems  Musculoskeletal:  Positive for back pain and gait problem.  Neurological:  Positive for weakness and numbness.  Psychiatric/Behavioral:         Anxiety, depression  All other systems reviewed and are negative.     Objective:   Physical Exam General: No acute distress HEENT: EOMI, oral membranes moist Cards: reg rate  Chest: normal effort Abdomen: Soft, NT, ND Skin: dry, intact Extremities: no edema Psych: pleasant and appropriate  Neuro: Left upper extremity remains 0-1 out of 5 with more proximal movement and distal.  Left lower extremity is 3 out of 5 hip flexion 3- out of 5 knee extension 0 to trace at the ankle.  decreased LT LUE and LLE  Left upper extremity notable for tone at the pectoralis major and minor as well as the biceps and wrist flexors.  Tone   1+/4LUE wrist, fingers, EF, pecs.  02/29/2020  left quads and gastrocs 1-2/4. DTR's 3+. Muscl: lleft shoulder painful with ER/IR. Has harness on.           Assessment & Plan:  1.Right Middle Cerebral Artery Stroke:             -HH aide to home 2. Spastic Hemiparesis: Continue Baclofen              -tizanidine and baclofen             -arrange xeomin 500 u to left pecs finger flexors             -discussed impoprtance of HEP which he isn't doing. 3.History of Hypertension: PCP Following. Continue to Monitor. 4.Dyslipidemia: Lipitor refilled 5.left shoulder pain consistent with hemiplegic shoulder.  Pain is both in the capsule itself as well as related to spasticity and neuropathic pain.               -CONTINUE Lyrica 50 mg twice daily titrating up to 3 times daily  -needs to work on ROM!!              --Refilled  oxycodone and a #30 today             We will continue the controlled substance monitoring program, this consists of regular clinic visits, examinations, routine drug screening, pill counts  as well as use of [place] Controlled Substance Reporting System. [place].     Fifteen minutes of face to face patient care time were spent during this visit. All questions were encouraged and answered.  Follow up with me in  6 weeks .

## 2021-05-14 ENCOUNTER — Telehealth: Payer: Self-pay

## 2021-05-14 NOTE — Telephone Encounter (Signed)
Lidocaine patches denied

## 2021-05-26 ENCOUNTER — Other Ambulatory Visit: Payer: Self-pay | Admitting: Physical Medicine & Rehabilitation

## 2021-05-26 DIAGNOSIS — M792 Neuralgia and neuritis, unspecified: Secondary | ICD-10-CM

## 2021-05-26 DIAGNOSIS — G8110 Spastic hemiplegia affecting unspecified side: Secondary | ICD-10-CM

## 2021-05-26 MED ORDER — PREGABALIN 50 MG PO CAPS: 50.0000 mg | ORAL_CAPSULE | Freq: Three times a day (TID) | ORAL | 2 refills | Status: DC

## 2021-05-29 ENCOUNTER — Other Ambulatory Visit: Payer: Self-pay

## 2021-05-29 DIAGNOSIS — G8110 Spastic hemiplegia affecting unspecified side: Secondary | ICD-10-CM

## 2021-05-29 MED ORDER — TIZANIDINE HCL 4 MG PO TABS: 4.0000 mg | ORAL_TABLET | Freq: Three times a day (TID) | ORAL | 2 refills | Status: DC

## 2021-05-31 ENCOUNTER — Other Ambulatory Visit: Payer: Self-pay | Admitting: Physical Medicine & Rehabilitation

## 2021-05-31 DIAGNOSIS — I63511 Cerebral infarction due to unspecified occlusion or stenosis of right middle cerebral artery: Secondary | ICD-10-CM

## 2021-06-10 ENCOUNTER — Encounter: Payer: Self-pay | Admitting: Podiatry

## 2021-06-10 ENCOUNTER — Other Ambulatory Visit: Payer: Self-pay

## 2021-06-10 ENCOUNTER — Ambulatory Visit (INDEPENDENT_AMBULATORY_CARE_PROVIDER_SITE_OTHER): Payer: BC Managed Care – PPO | Admitting: Podiatry

## 2021-06-10 DIAGNOSIS — M79675 Pain in left toe(s): Secondary | ICD-10-CM | POA: Diagnosis not present

## 2021-06-10 DIAGNOSIS — M79674 Pain in right toe(s): Secondary | ICD-10-CM

## 2021-06-10 DIAGNOSIS — B351 Tinea unguium: Secondary | ICD-10-CM

## 2021-06-10 NOTE — Progress Notes (Signed)
This patient presents to the office for evaluation and treatment of long thick painful nails .  This patient is unable to trim his own nails since the patient cannot reach his feet.  Patient says the nails are painful walking and wearing his shoes.  Patient has history of CVA .   He returns for preventive foot care services.  General Appearance  Alert, conversant and in no acute stress.  Vascular  Dorsalis pedis and posterior tibial  pulses are palpable  bilaterally.  Capillary return is within normal limits  bilaterally. Temperature is within normal limits  bilaterally.  Neurologic  Senn-Weinstein monofilament wire test within normal limits  bilaterally. Muscle power within normal limits bilaterally.  Nails Thick disfigured discolored nails with subungual debris  from hallux to fifth toes bilaterally. No evidence of bacterial infection or drainage bilaterally.  Orthopedic  No limitations of motion  feet .  No crepitus or effusions noted.  No bony pathology or digital deformities noted.  Skin  normotropic skin with no porokeratosis noted bilaterally.  No signs of infections or ulcers noted.     Onychomycosis  Pain in toes right foot  Pain in toes left foot  Debridement  of nails  1-5  B/L with a nail nipper.  Nails were then filed using a dremel tool with no incidents.  Patient requests an appointment for his arch of his feet.  To send to medical podiatrists.  RTC    Helane Gunther DPM

## 2021-06-15 ENCOUNTER — Other Ambulatory Visit: Payer: Self-pay | Admitting: Physical Medicine & Rehabilitation

## 2021-07-06 ENCOUNTER — Other Ambulatory Visit: Payer: Self-pay | Admitting: Physical Medicine & Rehabilitation

## 2021-07-06 DIAGNOSIS — I63511 Cerebral infarction due to unspecified occlusion or stenosis of right middle cerebral artery: Secondary | ICD-10-CM

## 2021-08-05 ENCOUNTER — Encounter: Payer: Self-pay | Admitting: Physical Medicine & Rehabilitation

## 2021-08-05 ENCOUNTER — Other Ambulatory Visit: Payer: Self-pay

## 2021-08-05 ENCOUNTER — Encounter
Payer: BC Managed Care – PPO | Attending: Physical Medicine & Rehabilitation | Admitting: Physical Medicine & Rehabilitation

## 2021-08-05 VITALS — BP 132/90 | HR 77 | Temp 98.7°F | Ht 71.0 in | Wt 232.0 lb

## 2021-08-05 DIAGNOSIS — G8110 Spastic hemiplegia affecting unspecified side: Secondary | ICD-10-CM | POA: Diagnosis not present

## 2021-08-05 NOTE — Patient Instructions (Signed)
PLEASE FEEL FREE TO CALL OUR OFFICE WITH ANY PROBLEMS OR QUESTIONS ([phone])      

## 2021-08-05 NOTE — Progress Notes (Signed)
Xeomin Injection for spasticity of upper extremity using needle EMG guidance Indication: Spastic hemiparesis (HCC)  G81.10 Dilution: 100 Units/ml        Total Units Injected: 400 Indication: Severe spasticity which interferes with ADL,mobility and/or  hygiene and is unresponsive to medication management and other conservative care Informed consent was obtained after describing risks and benefits of the procedure with the patient. This includes bleeding, bruising, infection, excessive weakness, or medication side effects. A REMS form is on file and signed.  Needle: 75mm injectable monopolar needle electrode    Number of units per muscle Pectoralis Major 75 units Pectoralis Minor 75 units Left teres major/minor 50 units Biceps 0 units Brachioradialis 0 units FCR 10 units FCU 10 units FDS 65 units FDP 65 units FPL 0 units Palmaris Longus 0 units Pronator Teres 50 units Pronator Quadratus 0 units Lumbricals 0 units All injections were done after obtaining appropriate EMG activity and after negative drawback for blood. The patient tolerated the procedure well. Post procedure instructions were given. Return in about 3 months (around 11/05/2021).

## 2021-08-09 ENCOUNTER — Other Ambulatory Visit: Payer: Self-pay | Admitting: Physical Medicine & Rehabilitation

## 2021-08-09 DIAGNOSIS — I63511 Cerebral infarction due to unspecified occlusion or stenosis of right middle cerebral artery: Secondary | ICD-10-CM

## 2021-09-12 ENCOUNTER — Other Ambulatory Visit: Payer: Self-pay | Admitting: Physical Medicine & Rehabilitation

## 2021-09-12 DIAGNOSIS — M792 Neuralgia and neuritis, unspecified: Secondary | ICD-10-CM

## 2021-09-12 DIAGNOSIS — G8110 Spastic hemiplegia affecting unspecified side: Secondary | ICD-10-CM

## 2021-09-14 ENCOUNTER — Encounter: Payer: Self-pay | Admitting: Podiatry

## 2021-09-14 ENCOUNTER — Other Ambulatory Visit: Payer: Self-pay | Admitting: Physical Medicine & Rehabilitation

## 2021-09-14 ENCOUNTER — Other Ambulatory Visit: Payer: Self-pay

## 2021-09-14 ENCOUNTER — Ambulatory Visit (INDEPENDENT_AMBULATORY_CARE_PROVIDER_SITE_OTHER): Payer: BC Managed Care – PPO | Admitting: Podiatry

## 2021-09-14 DIAGNOSIS — I63311 Cerebral infarction due to thrombosis of right middle cerebral artery: Secondary | ICD-10-CM

## 2021-09-14 DIAGNOSIS — M79675 Pain in left toe(s): Secondary | ICD-10-CM

## 2021-09-14 DIAGNOSIS — M79674 Pain in right toe(s): Secondary | ICD-10-CM | POA: Diagnosis not present

## 2021-09-14 DIAGNOSIS — M792 Neuralgia and neuritis, unspecified: Secondary | ICD-10-CM

## 2021-09-14 DIAGNOSIS — G8110 Spastic hemiplegia affecting unspecified side: Secondary | ICD-10-CM

## 2021-09-14 DIAGNOSIS — B351 Tinea unguium: Secondary | ICD-10-CM

## 2021-09-14 NOTE — Progress Notes (Signed)
This patient presents to the office for evaluation and treatment of long thick painful nails .  This patient is unable to trim his own nails since the patient cannot reach his feet.  Patient says the nails are painful walking and wearing his shoes.  Patient has history of CVA .   He returns for preventive foot care services.  General Appearance  Alert, conversant and in no acute stress.  Vascular  Dorsalis pedis and posterior tibial  pulses are palpable  bilaterally.  Capillary return is within normal limits  bilaterally. Temperature is within normal limits  bilaterally.  Neurologic  Senn-Weinstein monofilament wire test within normal limits  bilaterally. Muscle power within normal limits bilaterally.  Nails Thick disfigured discolored nails with subungual debris  from hallux to fifth toes bilaterally. No evidence of bacterial infection or drainage bilaterally.  Orthopedic  No limitations of motion  feet .  No crepitus or effusions noted.  No bony pathology or digital deformities noted.  Skin  normotropic skin with no porokeratosis noted bilaterally.  No signs of infections or ulcers noted.     Onychomycosis  Pain in toes right foot  Pain in toes left foot  Debridement  of nails  1-5  B/L with a nail nipper.  Nails were then filed using a dremel tool with no incidents.  Patient requests an appointment for his arch of his feet.  To send to medical podiatrists.  RTC    Helane Gunther DPM

## 2021-09-27 ENCOUNTER — Other Ambulatory Visit: Payer: Self-pay | Admitting: Physical Medicine & Rehabilitation

## 2021-11-11 ENCOUNTER — Encounter
Payer: BC Managed Care – PPO | Attending: Physical Medicine & Rehabilitation | Admitting: Physical Medicine & Rehabilitation

## 2021-11-11 ENCOUNTER — Other Ambulatory Visit: Payer: Self-pay

## 2021-11-11 ENCOUNTER — Encounter: Payer: Self-pay | Admitting: Physical Medicine & Rehabilitation

## 2021-11-11 VITALS — BP 133/90 | HR 71 | Temp 98.2°F | Ht 71.0 in | Wt 228.0 lb

## 2021-11-11 DIAGNOSIS — G8110 Spastic hemiplegia affecting unspecified side: Secondary | ICD-10-CM | POA: Insufficient documentation

## 2021-11-11 DIAGNOSIS — I63311 Cerebral infarction due to thrombosis of right middle cerebral artery: Secondary | ICD-10-CM | POA: Diagnosis not present

## 2021-11-11 MED ORDER — TIZANIDINE HCL 4 MG PO TABS: 4.0000 mg | ORAL_TABLET | Freq: Four times a day (QID) | ORAL | 2 refills | Status: DC

## 2021-11-11 MED ORDER — BACLOFEN 20 MG PO TABS: 20.0000 mg | ORAL_TABLET | Freq: Four times a day (QID) | ORAL | 5 refills | Status: DC

## 2021-11-11 NOTE — Patient Instructions (Signed)
PLEASE FEEL FREE TO CALL OUR OFFICE WITH ANY PROBLEMS OR QUESTIONS ([phone])      

## 2021-11-11 NOTE — Progress Notes (Signed)
Subjective:    Patient ID: Derek [name], male    DOB: [birth year], 46 y.o.   MRN: [id number]  HPI  Mr. Welden is here in follow-up of his right MCA infarct and associated spastic left hemiparesis.  I Saw him in October we performed xeomin injections to his left pectoralis muscle as well as his teres major and minor in addition to his finger flexors and forearm rotators. He had benefit with the injections but still deals with significant tone.   He remains on baclofen 20 mg 3 times daily along with tizanidine 4 mg 3 times daily.  He is tolerating these medications without significant sedation or effects on his blood pressure.  He is reporting some pain in his right hip and low back at times particularly after he is walk longer distances.  He is conscious of walking "too slow".  Continues to use a quad cane for balance when he walks.  He would like to improve his walking quality and speed.  Continues to wear his splints prickly the left upper extremity splint at night to stretch his hand.  Pain Inventory Average Pain 10 Pain Right Now 3 My pain is intermittent and aching  LOCATION OF PAIN  back, shoulder  BOWEL Number of stools per week: 7 Oral laxative use No  Type of laxative . Enema or suppository use No  History of colostomy No  Incontinent No   BLADDER Normal In and out cath, frequency . Able to self cath  na Bladder incontinence No  Frequent urination No  Leakage with coughing No  Difficulty starting stream No  Incomplete bladder emptying No    Mobility walk with assistance use a cane how many minutes can you walk? 5 ability to climb steps?  yes do you drive?  no  Function disabled: date disabled . I need assistance with the following:  dressing, bathing, and meal prep  Neuro/Psych weakness trouble walking  Prior Studies Any changes since last visit?  no  Physicians involved in your care Any changes since last visit?  no   Family History  Problem  Relation Age of Onset   Stroke Neg Hx    CAD Neg Hx    Social History   Socioeconomic History   Marital status: Married    Spouse name: Not on file   Number of children: Not on file   Years of education: Not on file   Highest education level: Not on file  Occupational History   Occupation: truck driver  Tobacco Use   Smoking status: Every Day    Packs/day: 0.20    Types: Cigarettes   Smokeless tobacco: Never  Vaping Use   Vaping Use: Never used  Substance and Sexual Activity   Alcohol use: Yes    Comment: 2-3 times a week.    Drug use: Not Currently    Types: Marijuana   Sexual activity: Not Currently    Birth control/protection: Condom  Other Topics Concern   Not on file  Social History Narrative   Not on file   Social Determinants of Health   Financial Resource Strain: Not on file  Food Insecurity: Not on file  Transportation Needs: Not on file  Physical Activity: Not on file  Stress: Not on file  Social Connections: Not on file   Past Surgical History:  Procedure Laterality Date   BUBBLE STUDY  01/10/2020   Procedure: BUBBLE STUDY;  Surgeon: Geralynn Rile, MD;  Location: [place];  Service: Cardiovascular;;  TEE WITHOUT CARDIOVERSION N/A 01/10/2020   Procedure: TRANSESOPHAGEAL ECHOCARDIOGRAM (TEE);  Surgeon: Geralynn Rile, MD;  Location: [place];  Service: Cardiovascular;  Laterality: N/A;   Past Medical History:  Diagnosis Date   Acute ischemic right MCA stroke ([facility]) 01/07/2020   Alcohol dependence ([place])    Back pain    Marijuana dependence (HCC)    Tobacco dependence    BP 133/90    Pulse 71    Temp 98.2 F (36.8 C) (Oral)    Ht 5\' 11"  (1.803 m)    Wt 228 lb (103.4 kg)    SpO2 96%    BMI 31.80 kg/m   Opioid Risk Score:   Fall Risk Score:  `1  Depression screen PHQ 2/9  Depression screen [facility] 2/9 11/11/2021 08/05/2021 05/13/2021 06/18/2020 02/27/2020  Decreased Interest 2 1 1  0 0  Down, Depressed, Hopeless 2 1 1  0 0  PHQ - 2  Score 4 2 2  0 0  Altered sleeping - - - - 0  Tired, decreased energy - - - - 1  Change in appetite - - - - 0  Feeling bad or failure about yourself  - - - - 0  Trouble concentrating - - - - 0  Moving slowly or fidgety/restless - - - - 0  Suicidal thoughts - - - - 0  PHQ-9 Score - - - - 1  Difficult doing work/chores - - - - Somewhat difficult     Review of Systems  Musculoskeletal:  Positive for back pain and gait problem.       Shoulder pain  Neurological:  Positive for weakness.  All other systems reviewed and are negative.     Objective:   Physical Exam General: No acute distress HEENT: NCAT, EOMI, oral membranes moist Cards: reg rate  Chest: normal effort Abdomen: Soft, NT, ND Skin: dry, intact Extremities: no edema Psych: pleasant and appropriate  Neuro: Left upper extremity remains 0-1 out of 5 with more proximal movement and distal.  Left lower extremity is 3 out of 5 hip flexion 3 out of 5 knee extension 0 to trace at the ankle.  decreased LT LUE and LLE remains.  2 out of 4 tone left pec and shoulder girdle musculature.  Biceps 1 out of 4 finger flexors and wrist flexors 1+ to 2 out of 4.  Quads are 1 out of 4 gastrocs 1 out of 4.  Reflexes are 3+..  Patient ambulates and uses circumduction and lifting of the left hip to help clear his left foot.  He utilizes his right back and hip to push the left leg around.  He often places the foot on the ground as he does not have any active ankle dorsiflexion.           Assessment & Plan:  1.Right Middle Cerebral Artery Stroke:             - 2. Spastic Hemiparesis: Continue Baclofen              -tizanidine and baclofen.  Increase to 4 times daily             -Repeat xeomin 500 u to left pecs finger flexors             -Discussed home exercise program g.  -Made referral to [facility] neuro rehab for outpatient PT and OT to address his gait and spasticity.  Reiterated that it is important to remain his mechanics rather than  worry about  how fast he is moving. 3.History of Hypertension: PCP Following. Continue to Monitor. 4.Dyslipidemia: Lipitor refilled 5.left shoulder pain consistent with hemiplegic shoulder.  Continue Lyrica  -Oxycodone recently refilled   15 minutes of face to face patient care time were spent during this visit. All questions were encouraged and answered.  Follow up with me in 2 months        Assessment & Plan:

## 2021-11-23 ENCOUNTER — Encounter: Payer: Self-pay | Admitting: Podiatry

## 2021-11-23 ENCOUNTER — Other Ambulatory Visit: Payer: Self-pay

## 2021-11-23 ENCOUNTER — Ambulatory Visit (INDEPENDENT_AMBULATORY_CARE_PROVIDER_SITE_OTHER): Payer: BC Managed Care – PPO | Admitting: Podiatry

## 2021-11-23 DIAGNOSIS — M79674 Pain in right toe(s): Secondary | ICD-10-CM

## 2021-11-23 DIAGNOSIS — M79675 Pain in left toe(s): Secondary | ICD-10-CM

## 2021-11-23 DIAGNOSIS — I63311 Cerebral infarction due to thrombosis of right middle cerebral artery: Secondary | ICD-10-CM | POA: Diagnosis not present

## 2021-11-23 DIAGNOSIS — B351 Tinea unguium: Secondary | ICD-10-CM

## 2021-11-23 NOTE — Progress Notes (Signed)
This patient presents to the office for evaluation and treatment of long thick painful nails .  This patient is unable to trim his own nails since the patient cannot reach his feet.  Patient says the nails are painful walking and wearing his shoes.  Patient has history of CVA .   He returns for preventive foot care services.  General Appearance  Alert, conversant and in no acute stress.  Vascular  Dorsalis pedis and posterior tibial  pulses are palpable  bilaterally.  Capillary return is within normal limits  bilaterally. Temperature is within normal limits  bilaterally.  Neurologic  Senn-Weinstein monofilament wire test within normal limits  bilaterally. Muscle power within normal limits bilaterally.  Nails Thick disfigured discolored nails with subungual debris  from hallux to fifth toes bilaterally. No evidence of bacterial infection or drainage bilaterally.  Orthopedic  No limitations of motion  feet .  No crepitus or effusions noted.  No bony pathology or digital deformities noted.  Skin  normotropic skin with no porokeratosis noted bilaterally.  No signs of infections or ulcers noted.     Onychomycosis  Pain in toes right foot  Pain in toes left foot  Debridement  of nails  1-5  B/L with a nail nipper.  Nails were then filed using a dremel tool with no incidents.    RTC  10 weeks    Latavious Bitter DPM   

## 2021-12-01 ENCOUNTER — Ambulatory Visit: Payer: BC Managed Care – PPO | Admitting: Occupational Therapy

## 2021-12-01 ENCOUNTER — Ambulatory Visit: Payer: BC Managed Care – PPO | Attending: Physical Medicine & Rehabilitation

## 2021-12-01 ENCOUNTER — Other Ambulatory Visit: Payer: Self-pay

## 2021-12-01 ENCOUNTER — Encounter: Payer: Self-pay | Admitting: Occupational Therapy

## 2021-12-01 DIAGNOSIS — R293 Abnormal posture: Secondary | ICD-10-CM | POA: Insufficient documentation

## 2021-12-01 DIAGNOSIS — R2681 Unsteadiness on feet: Secondary | ICD-10-CM

## 2021-12-01 DIAGNOSIS — R41840 Attention and concentration deficit: Secondary | ICD-10-CM | POA: Insufficient documentation

## 2021-12-01 DIAGNOSIS — M79602 Pain in left arm: Secondary | ICD-10-CM | POA: Diagnosis not present

## 2021-12-01 DIAGNOSIS — M6281 Muscle weakness (generalized): Secondary | ICD-10-CM

## 2021-12-01 DIAGNOSIS — I63311 Cerebral infarction due to thrombosis of right middle cerebral artery: Secondary | ICD-10-CM | POA: Insufficient documentation

## 2021-12-01 DIAGNOSIS — R208 Other disturbances of skin sensation: Secondary | ICD-10-CM

## 2021-12-01 DIAGNOSIS — G8110 Spastic hemiplegia affecting unspecified side: Secondary | ICD-10-CM

## 2021-12-01 DIAGNOSIS — R2689 Other abnormalities of gait and mobility: Secondary | ICD-10-CM | POA: Diagnosis not present

## 2021-12-01 NOTE — Therapy (Signed)
[facility] [address] [place], [place], [address] Phone: [phone]   Fax:  [phone]  Physical Therapy Evaluation  Patient Details  Name: Derek [name] MRN: [id number] Date of Birth: 12-29-1975 Referring Provider (PT): Alger Simons, MD   Encounter Date: 12/01/2021   PT End of Session - 12/01/21 1606     Visit Number 1    Number of Visits 9   4 week POC   Date for PT Re-Evaluation 01/01/22    Authorization Type BCBS (VL PT/OT: 44)    PT Start Time 1534    PT Stop Time 1610    PT Time Calculation (min) 36 min    Activity Tolerance Patient tolerated treatment well    Behavior During Therapy [facility] for tasks assessed/performed             Past Medical History:  Diagnosis Date   Acute ischemic right MCA stroke ([place]) 01/07/2020   Alcohol dependence ([place])    Back pain    Marijuana dependence ([place])    Tobacco dependence     Past Surgical History:  Procedure Laterality Date   BUBBLE STUDY  01/10/2020   Procedure: BUBBLE STUDY;  Surgeon: Geralynn Rile, MD;  Location: [facility];  Service: Cardiovascular;;   TEE WITHOUT CARDIOVERSION N/A 01/10/2020   Procedure: TRANSESOPHAGEAL ECHOCARDIOGRAM (TEE);  Surgeon: Geralynn Rile, MD;  Location: [facility];  Service: Cardiovascular;  Laterality: N/A;    There were no vitals filed for this visit.    Subjective Assessment - 12/01/21 1541     Subjective Patient reports still trying to walk daily, using a LBQC. Is not wearing brace on the L side today. Reports tried to take a few steps without the [facility], but reports balance is not good. Reports two falls a few months prior, reports was trying to step over something and lost his balance.    Pertinent History R MCA CVA, Alcohol Dependence, Marijuana/Tobacco Dependence, Back Pain    Limitations Standing;House hold activities;Walking    Patient Stated Goals Improve balance without device; reduce  stiffness in the leg    Currently in Pain? Yes    Pain Score 5     Pain Location Hip    Pain Orientation Left    Pain Descriptors / Indicators Aching    Pain Type Chronic pain    Pain Onset More than a month ago    Pain Frequency Intermittent                [facility] PT Assessment - 12/01/21 1543       Assessment   Medical Diagnosis Spastic L Hemiparesis    Referring Provider (PT) Alger Simons, MD    Onset Date/Surgical Date 01/07/20    Hand Dominance Right    Prior Therapy Patient well known to this clinician from prior therapy      Precautions   Precautions Fall      Balance Screen   Has the patient fallen in the past 6 months Yes    How many times? 2    Has the patient had a decrease in activity level because of a fear of falling?  No    Is the patient reluctant to leave their home because of a fear of falling?  No      Home Social worker Private residence    Living Arrangements Spouse/significant other    Available Help at Discharge Family    Type of [place]  Home Access Stairs to enter    Entrance Stairs-Number of Steps 8    Entrance Stairs-Rails Right;Left;Can reach both    Home Layout Multi-level   reports walking to the basement some   [place] - quad;Bedside commode;Tub bench;Wheelchair - manual      Prior Function   Vocation On disability    Leisure sleeping, watch TV      Sensation   Light Touch Impaired by gross assessment    Additional Comments impairments noted in LLE      Coordination   Gross Motor Movements are Fluid and Coordinated No      Posture/Postural Control   Posture/Postural Control Postural limitations    Postural Limitations Rounded Shoulders;Forward head      Tone   Assessment Location Left Lower Extremity      ROM / Strength   AROM / PROM / Strength Strength      Strength   Overall Strength Deficits    Overall Strength Comments LLE: Hip Flexion 3-/5, L Knee Ext/Flexion: 3-/5, and DF:  1/5      Transfers   Transfers Sit to Stand;Stand to Sit    Sit to Stand 5: Supervision    Five time sit to stand comments  21.09 secs with single UE support from standard    Stand to Sit 5: Supervision      Ambulation/Gait   Ambulation/Gait Yes    Ambulation/Gait Assistance 5: Supervision    Ambulation/Gait Assistance Details continue to ambulate with slow gait speed and LBQC, patient not using brace in session (reports forgot at home). PT educated to bring to next session.    Ambulation Distance (Feet) 100 Feet    Assistive device Large base quad cane    Gait Pattern Step-through pattern;Decreased arm swing - left;Left flexed knee in stance;Decreased weight shift to left;Decreased stance time - left;Decreased step length - right;Decreased step length - left;Lateral trunk lean to right;Poor foot clearance - left    Ambulation Surface Level;Indoor    Gait velocity 27.78 secs = 1.18 ft/sec or 0.36 m/s    Stairs Yes    Stairs Assistance 5: Supervision    Stairs Assistance Details (indicate cue type and reason) step to pattern with ascend/descending stairs and single rail on the R side.    Stair Management Technique One rail Right;Step to pattern;Forwards    Number of Stairs 4    Height of Stairs 6      Balance   Balance Assessed Yes      Static Standing Balance   Static Standing - Balance Support No upper extremity supported    Static Standing - Level of Assistance 5: Stand by assistance    Static Standing Balance -  Activities  Romberg - Eyes Opened;Single Leg Stance - Right Leg;Single Leg Stance - Left Leg    Static Standing - Comment/# of Minutes Romberg Stance with Eyes Open: 30 seconds. SLS on RLE: 10 seconds; SLS on LLE: 10 seconds      Standardized Balance Assessment   Standardized Balance Assessment Timed Up and Go Test      Timed Up and Go Test   TUG Normal TUG    Normal TUG (seconds) 29.5    TUG Comments with use of LBQC      LLE Tone   LLE Tone Moderate;Hypertonic                         Objective measurements completed on examination: See  above findings.                PT Education - 12/01/21 1540     Education Details Educated on [employer]) Educated Patient    Methods Explanation    Comprehension Verbalized understanding              PT Short Term Goals - 12/01/21 1616       PT SHORT TERM GOAL #1   Title = LTGs               PT Long Term Goals - 12/01/21 1617       PT LONG TERM GOAL #1   Title Pt and caregiver will report independence with HEP for strengthening/balance/stretching (ALL LTGs Due: 01/01/22)    Baseline no HEP established    Time 4    Period Weeks    Status New    Target Date 01/01/22      PT LONG TERM GOAL #2   Title Pt will improve 5x sit <> stand to </= 18 secs with single UE support to demo improved balance    Baseline 21.09 secs    Time 4    Period Weeks    Status New      PT LONG TERM GOAL #3   Title Pt will improve gait speed to >/= 1.5 ft/sec with LRAD to demo improved community mobility    Baseline 1.18 ft/sec    Time 4    Period Weeks    Status New      PT LONG TERM GOAL #4   Title Pt will improve TUG to </= 25 seconds to demo improved balance and reudced fall risk    Baseline 29.5 secs with LBQC    Time 4    Period Weeks    Status New      PT LONG TERM GOAL #5   Title Pt will be able to ambulate >/= 500 ft outdoors w/ LRAD and ascend/descend curb iwth supervision to improve community mobility    Baseline TBA    Time 4    Period Weeks    Status New                    Plan - 12/01/21 1620     Clinical Impression Statement Patient is a 46 y.o. male that was referred to Neuro OPPT services for Spasic L Hemiparesis. Patient's PMH significant for the following:  R MCA CVA, Alcohol Dependence, Marijuana/Tobacco Dependence, Back Pain. Patient is still demonstrating increased fall risk with TUG time of 29 seconds and 5x sit <> stand  of 21.09 seconds. Patient is currently ambulating at 1.18 ft/sec indicating household ambulator and increased fall risk. Patient continues to have significant tone in the LLE affecting ROM/mobility. Patient will benefit from skilled PT services to address impairments, maximize functional mobiltiy and reduce fall risk.    Personal Factors and Comorbidities Comorbidity 2    Comorbidities R MCA CVA, Alcohol Dependence, Marijuana/Tobacco Dependence, Back Pain    Examination-Activity Limitations Transfers;Stairs;Stand;Locomotion Level    Stability/Clinical Decision Making Stable/Uncomplicated    Clinical Decision Making Low    Rehab Potential Fair    PT Frequency 2x / week    PT Duration 4 weeks    PT Treatment/Interventions ADLs/Self Care Home Management;Moist Heat;Cryotherapy;DME Instruction;Therapeutic activities;Functional mobility training;Stair training;Gait training;Patient/family education;Balance training;Therapeutic exercise;Orthotic Fit/Training;Neuromuscular re-education;Manual techniques;Dry needling;Passive range of motion    PT Next Visit Plan establish HEP focused on strengthening/stretching;  please teach caregiver if present    Consulted and Agree with Plan of Care Patient             Patient will benefit from skilled therapeutic intervention in order to improve the following deficits and impairments:  Abnormal gait, Decreased balance, Decreased endurance, Decreased mobility, Difficulty walking, Decreased range of motion, Impaired tone, Pain, Postural dysfunction, Decreased strength, Impaired flexibility, Decreased activity tolerance, Decreased coordination, Impaired sensation  Visit Diagnosis: Muscle weakness (generalized)  Other abnormalities of gait and mobility  Unsteadiness on feet  Muscle weakness     Problem List Patient Active Problem List   Diagnosis Date Noted   Pain due to onychomycosis of toenails of both feet 06/10/2021   Neuropathic pain 04/09/2020    Pain    Spastic hemiparesis (HCC)    History of hypertension    Dyslipidemia    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

## 2021-12-01 NOTE — Therapy (Signed)
[facility] [address] [place], [place], [address] Phone: [phone]   Fax:  [phone]  Occupational Therapy Evaluation  Patient Details  Name: Derek [name] MRN: [id number] Date of Birth: 09/25/1976 Referring Provider (OT): Vickey Sages Date: 12/01/2021   OT End of Session - 12/01/21 1558     Visit Number 1    Number of Visits 9    Date for OT Re-Evaluation 01/15/22    Authorization Type BCBS, PT/OT combined = 75 visits    Progress Note Due on Visit 10    OT Start Time 1455    OT Stop Time 1530    OT Time Calculation (min) 35 min    Behavior During Therapy [facility] for tasks assessed/performed             Past Medical History:  Diagnosis Date   Acute ischemic right MCA stroke ([place]) 01/07/2020   Alcohol dependence ([place])    Back pain    Marijuana dependence ([place])    Tobacco dependence     Past Surgical History:  Procedure Laterality Date   BUBBLE STUDY  01/10/2020   Procedure: BUBBLE STUDY;  Surgeon: Geralynn Rile, MD;  Location: [facility];  Service: Cardiovascular;;   TEE WITHOUT CARDIOVERSION N/A 01/10/2020   Procedure: TRANSESOPHAGEAL ECHOCARDIOGRAM (TEE);  Surgeon: Geralynn Rile, MD;  Location: [facility];  Service: Cardiovascular;  Laterality: N/A;    There were no vitals filed for this visit.   Subjective Assessment - 12/01/21 1504     Subjective  I want to get to 85%    Currently in Pain? Yes    Pain Score 3     Pain Location Arm    Pain Orientation Left    Pain Descriptors / Indicators Aching    Pain Type Chronic pain    Pain Onset More than a month ago    Pain Frequency Intermittent    Aggravating Factors  Poor positioning    Pain Relieving Factors Reposition    Multiple Pain Sites Yes    Pain Score 5    Pain Location Hip    Pain Orientation Left    Pain Descriptors / Indicators Aching    Pain Type Chronic pain    Pain Onset More than a month ago     Pain Frequency Intermittent    Aggravating Factors  Standing or sitting too long    Pain Relieving Factors unsure               [facility] OT Assessment - 12/01/21 0001       Assessment   Medical Diagnosis Stroke    Referring Provider (OT) Derek [name]    Onset Date/Surgical Date 01/07/20    Hand Dominance Right    Prior Therapy Patient well known to this clinician from prior therapy      Precautions   Precautions Fall      Balance Screen   Has the patient fallen in the past 6 months Yes    How many times? 2      Prior Function   Vocation On disability    Vocation Requirements drove truck    Leisure sleeping, watch TV      ADL   Eating/Feeding Minimal assistance    Grooming Moderate assistance    Upper Body Bathing Moderate assistance    Lower Body Bathing Moderate assistance    Upper Body Dressing Moderate assistance    Lower Body Dressing Moderate assistance  Financial trader independent    Toileting -  Hygiene Modified Independent    Tub/Shower Transfer Moderate assistance    ADL comments Patient now has an aide assisting with bathing and dressing.  Patient encouraged to have aide attend therapy on occasion so we can increase carryover to home environment.      Written Expression   Dominant Hand Right      Vision - History   Additional Comments Patient experiencing some blurred vision with small print.  Was 20/30 at last eye appointment - discussed trying reader.      Cognition   Overall Cognitive Status Impaired/Different from baseline    Cognition Comments Limited insight - very focused on debility.  Patient admits feeling depressed, sleeping a lot.  Patient is wary of talking about this with doctor - "I already take so many pills."      Posture/Postural Control   Posture/Postural Control Postural limitations    Postural Limitations Rounded Shoulders;Forward head;Increased thoracic kyphosis;Posterior  pelvic tilt;Weight shift right;Flexed trunk      Sensation   Light Touch Impaired by gross assessment    Additional Comments Nearly absent sensation in left UE      Coordination   Gross Motor Movements are Fluid and Coordinated No    Fine Motor Movements are Fluid and Coordinated No    Box and Blocks Unable      Perception   Perception Impaired      Tone   Assessment Location Left Upper Extremity      ROM / Strength   AROM / PROM / Strength PROM;AROM      AROM   Overall AROM  Deficits    Overall AROM Comments Little to no volitional movement in LUE      PROM   Overall PROM  Deficits    Overall PROM Comments Pain with range of motion beyond 70 degrees in left shoulder, lacks full elbow extension      LUE Tone   LUE Tone Moderate;Hypertonic;Modified [place]                              OT Education - 12/01/21 1553     Education Details Long discussion about potential and goals for OT this time through.  Patient agreeable to work toward improving passive motion and decreasing pain in LUE and also to work toward increased participation with ADL.  Patient now has a caregiver - and would love for her to attend therapy on occasion to improve carryover to home.    Person(s) Educated Patient    Methods Explanation    Comprehension Need further instruction              OT Short Term Goals - 12/01/21 1611       OT SHORT TERM GOAL #1   Title Patient will complete a self stretching program for left upper extremity    Baseline XX    Time 4    Period Weeks    Status New    Target Date 01/15/22      OT SHORT TERM GOAL #2   Title Patient will don / doff front opening shirt or jacket excluding fasteners with increased time and mod assist    Baseline max-total    Time 4    Period Weeks    Status New      OT SHORT TERM GOAL #3  Title Patient will report pain of less than 4/10 with forward low reaching tasks    Baseline Pain 3 at rest, ranges up to  6-7 with passive movement    Time 4    Period Weeks    Status New      OT SHORT TERM GOAL #4   Title Pt to bathe self with mod assist seated    Baseline xx    Period Weeks    Status New      OT SHORT TERM GOAL #5   Title Patient to don/doff splint with mod assist    Baseline xx    Time 4    Period Weeks    Status New               OT Long Term Goals - 12/01/21 1617       OT LONG TERM GOAL #1   Title xxx    Baseline xxx      OT LONG TERM GOAL #2   Title xxx    Baseline xxx      OT LONG TERM GOAL #3   Title xxx    Baseline xxx      OT LONG TERM GOAL #4   Title xxx    Baseline xxx                   Plan - 12/01/21 1600     Clinical Impression Statement Patient is a 46 yr old male well known to this clinician from prior episodes of care.  Patient had stroke on 01/07/20 and continues to experience left spastic hemiplegia, pain in left shoulder, decreased range of motion in all joints of LUE,decreased balance with recent falls, decreased weight shift onto left side in sitting or standing, decreased initiation / decreased insight, signs and symptoms of depression.  Patient now with a caregiver who can assist with basic self care skills.  Patient may benefit from brief OT intervention to help caregiver and patient learn home exercise program, reduce pain in left shoulder, assess appropriateness of positioning devices (e.g. slings and splints)    OT Occupational Profile and History Detailed Assessment- Review of Records and additional review of physical, cognitive, psychosocial history related to current functional performance    Occupational performance deficits (Please refer to evaluation for details): ADL's;Leisure;Social Participation    Body Structure / Function / Physical Skills ADL;Decreased knowledge of use of DME;Obesity;Strength;Tone;Pain;GMC;Dexterity;Balance;Body mechanics;Edema;Proprioception;UE functional use;Vestibular;ROM;IADL;Endurance;Vision;Scar  mobility;Coordination;Flexibility;Mobility;Sensation;Muscle spasms;FMC;Decreased knowledge of precautions    Cognitive Skills Attention;Emotional;Energy/Drive;Problem Solve;Safety Awareness;Sequencing;Thought    Rehab Potential Fair    Clinical Decision Making Several treatment options, min-mod task modification necessary    Comorbidities Affecting Occupational Performance: May have comorbidities impacting occupational performance    Modification or Assistance to Complete Evaluation  No modification of tasks or assist necessary to complete eval    OT Frequency 2x / week    OT Duration 4 weeks    OT Treatment/Interventions Self-care/ADL training;Moist Heat;Fluidtherapy;DME and/or AE instruction;Splinting;Balance training;Therapeutic activities;Cognitive remediation/compensation;Therapeutic exercise;Ultrasound;Neuromuscular education;Functional Mobility Training;Passive range of motion;Visual/perceptual remediation/compensation;Patient/family education;Manual Therapy;Electrical Stimulation    Plan Patient should bring in splint - need to assess fit and wearing schedule,  If caregiver present - would be beneficial to do HEP - PROM    Consulted and Agree with Plan of Care Patient             Patient will benefit from skilled therapeutic intervention in order to improve the following deficits and impairments:   Body Structure / Function /  Physical Skills: ADL, Decreased knowledge of use of DME, Obesity, Strength, Tone, Pain, GMC, Dexterity, Balance, Body mechanics, Edema, Proprioception, UE functional use, Vestibular, ROM, IADL, Endurance, Vision, Scar mobility, Coordination, Flexibility, Mobility, Sensation, Muscle spasms, FMC, Decreased knowledge of precautions Cognitive Skills: Attention, Emotional, Energy/Drive, Problem Solve, Safety Awareness, Sequencing, Thought     Visit Diagnosis: Spastic hemiplegia affecting nondominant side (HCC) - Plan: Ot plan of care cert/re-cert  Attention and  concentration deficit - Plan: Ot plan of care cert/re-cert  Abnormal posture - Plan: Ot plan of care cert/re-cert  Muscle weakness - Plan: Ot plan of care cert/re-cert  Unsteadiness on feet - Plan: Ot plan of care cert/re-cert  Muscle weakness (generalized) - Plan: Ot plan of care cert/re-cert  Pain in left arm - Plan: Ot plan of care cert/re-cert  Other disturbances of skin sensation - Plan: Ot plan of care cert/re-cert    Problem List Patient Active Problem List   Diagnosis Date Noted   Pain due to onychomycosis of toenails of both feet 06/10/2021   Neuropathic pain 04/09/2020   Pain    Spastic hemiparesis (HCC)    History of hypertension    Dyslipidemia    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 [place], [place], [address] Phone: [phone]   Fax:  [phone]  Name: GRALYN MAGIERA MRN: [id number] Date of Birth: 07-15-1976

## 2021-12-02 DIAGNOSIS — I10 Essential (primary) hypertension: Secondary | ICD-10-CM | POA: Diagnosis not present

## 2021-12-02 DIAGNOSIS — E7800 Pure hypercholesterolemia, unspecified: Secondary | ICD-10-CM | POA: Diagnosis not present

## 2021-12-02 DIAGNOSIS — I69354 Hemiplegia and hemiparesis following cerebral infarction affecting left non-dominant side: Secondary | ICD-10-CM | POA: Diagnosis not present

## 2021-12-02 DIAGNOSIS — I6529 Occlusion and stenosis of unspecified carotid artery: Secondary | ICD-10-CM | POA: Diagnosis not present

## 2021-12-03 ENCOUNTER — Encounter: Payer: Self-pay | Admitting: Physical Therapy

## 2021-12-03 ENCOUNTER — Other Ambulatory Visit: Payer: Self-pay

## 2021-12-03 ENCOUNTER — Ambulatory Visit: Payer: BC Managed Care – PPO | Attending: Physical Medicine & Rehabilitation | Admitting: Physical Therapy

## 2021-12-03 DIAGNOSIS — M79602 Pain in left arm: Secondary | ICD-10-CM | POA: Insufficient documentation

## 2021-12-03 DIAGNOSIS — R2681 Unsteadiness on feet: Secondary | ICD-10-CM | POA: Insufficient documentation

## 2021-12-03 DIAGNOSIS — R2689 Other abnormalities of gait and mobility: Secondary | ICD-10-CM | POA: Insufficient documentation

## 2021-12-03 DIAGNOSIS — M6281 Muscle weakness (generalized): Secondary | ICD-10-CM | POA: Insufficient documentation

## 2021-12-03 DIAGNOSIS — G8110 Spastic hemiplegia affecting unspecified side: Secondary | ICD-10-CM | POA: Insufficient documentation

## 2021-12-03 DIAGNOSIS — R41840 Attention and concentration deficit: Secondary | ICD-10-CM | POA: Diagnosis not present

## 2021-12-03 NOTE — Therapy (Signed)
[facility] [address] [place], [place], [address] Phone: [phone]   Fax:  [phone]  Physical Therapy Treatment  Patient Details  Name: Derek [name] MRN: [id number] Date of Birth: August 28, 1976 Referring Provider (PT): Faith Rogue, MD   Encounter Date: 12/03/2021   PT End of Session - 12/03/21 1239     Visit Number 2    Number of Visits 9   4 week POC   Date for PT Re-Evaluation 01/01/22    Authorization Type BCBS (VL PT/OT: 75)    PT Start Time 1233    PT Stop Time 1313    PT Time Calculation (min) 40 min    Activity Tolerance Patient tolerated treatment well    Behavior During Therapy [facility] for tasks assessed/performed             Past Medical History:  Diagnosis Date   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/07/2020   Alcohol dependence (HCC)    Back pain    Marijuana dependence (HCC)    Tobacco dependence     Past Surgical History:  Procedure Laterality Date   BUBBLE STUDY  01/10/2020   Procedure: BUBBLE STUDY;  Surgeon: Sande Rives, MD;  Location: [facility] ENDOSCOPY;  Service: Cardiovascular;;   TEE WITHOUT CARDIOVERSION N/A 01/10/2020   Procedure: TRANSESOPHAGEAL ECHOCARDIOGRAM (TEE);  Surgeon: Sande Rives, MD;  Location: [facility] ENDOSCOPY;  Service: Cardiovascular;  Laterality: N/A;    There were no vitals filed for this visit.   Subjective Assessment - 12/03/21 1237     Subjective No new complaints. Reports Caregiver will be here at the next appt thats on a Thursday, unable to make it today. No falls.    Pertinent History R MCA CVA, Alcohol Dependence, Marijuana/Tobacco Dependence, Back Pain    Limitations Standing;House hold activities;Walking    Patient Stated Goals Improve balance without device; reduce stiffness in the leg    Currently in Pain? Yes    Pain Score 4     Pain Location Generalized   back and left shoulder   Pain Orientation Left    Pain Descriptors /  Indicators Aching    Pain Type Chronic pain    Pain Onset More than a month ago    Pain Frequency Intermittent    Aggravating Factors  poor positioning    Pain Relieving Factors Reposition                     [facility] Adult PT Treatment/Exercise - 12/03/21 1301       Transfers   Transfers Sit to Stand;Stand to Sit    Sit to Stand 5: Supervision    Stand to Sit 5: Supervision      Ambulation/Gait   Ambulation/Gait Yes    Ambulation/Gait Assistance 5: Supervision    Ambulation Distance (Feet) --   around clinic with session   Assistive device Large base quad cane    Gait Pattern Step-through pattern;Decreased arm swing - left;Left flexed knee in stance;Decreased weight shift to left;Decreased stance time - left;Decreased step length - right;Decreased step length - left;Lateral trunk lean to right;Poor foot clearance - left    Ambulation Surface Level;Indoor      Exercises   Exercises Other Exercises    Other Exercises  reviewed HEP from previous episode of care. was able to get through supine and seated ex's today with cues provided on correct form/technique. Refer to Medbridge program for full details.  Access Code: [email or web address] URL: [email or web address] Date: 12/03/2021 Prepared by: Sallyanne KusterKathy Ohana Birdwell  Reviewed the following in today's session: Exercises Supine Bridge - 2 x daily - 5 x weekly - 2 sets - 8 reps Bent Knee Fallouts - 2 x daily - 5 x weekly - 2 sets - 8 reps Supine March with Resistance Band - 1 x daily - 7 x weekly - 3 sets - 10 reps Supine Heel Slide - 2 x daily - 5 x weekly - 2 sets - 8 reps Seated Heel Slide - 1 x daily - 5 x weekly - 2 sets - 8 reps Sit to Stand with Armchair - 1 x daily - 5 x weekly - 3 sets - 5 reps Seated Hamstring Stretch - 3 x daily - 7 x weekly - 1 sets - 3 reps - 30 sec hold Seated Gastroc Stretch with Strap - 3 x daily - 7 x weekly - 1 sets - 3 reps - 30 sec hold  Still need to review these: Side to  Side Weight Shift with Counter Support - 2 x daily - 7 x weekly - 2 sets - 10 reps Standing Gastroc Stretch at Counter - 3 x daily - 7 x weekly - 1 sets - 4 reps - 30 sec hold Supine Hamstring Stretch with Caregiver - 3 x daily - 7 x weekly - 1 sets - 4 reps - 30 sec hold Hip flexion/extension with resistance - 3 x daily - 7 x weekly - 1 sets - 4 reps - 30 sec hold Supine Ankle Dorsiflexion Stretch with Caregiver - 3 x daily - 7 x weekly - 1 sets - 4 reps - 30 sec hold           PT Short Term Goals - 12/01/21 1616       PT SHORT TERM GOAL #1   Title = LTGs               PT Long Term Goals - 12/01/21 1617       PT LONG TERM GOAL #1   Title Pt and caregiver will report independence with HEP for strengthening/balance/stretching (ALL LTGs Due: 01/01/22)    Baseline no HEP established    Time 4    Period Weeks    Status New    Target Date 01/01/22      PT LONG TERM GOAL #2   Title Pt will improve 5x sit <> stand to </= 18 secs with single UE support to demo improved balance    Baseline 21.09 secs    Time 4    Period Weeks    Status New      PT LONG TERM GOAL #3   Title Pt will improve gait speed to >/= 1.5 ft/sec with LRAD to demo improved community mobility    Baseline 1.18 ft/sec    Time 4    Period Weeks    Status New      PT LONG TERM GOAL #4   Title Pt will improve TUG to </= 25 seconds to demo improved balance and reudced fall risk    Baseline 29.5 secs with LBQC    Time 4    Period Weeks    Status New      PT LONG TERM GOAL #5   Title Pt will be able to ambulate >/= 500 ft outdoors w/ LRAD and ascend/descend curb iwth supervision to improve community mobility    Baseline TBA    Time 4  Period Weeks    Status New                   Plan - 12/03/21 1240     Clinical Impression Statement Today's skilled session began to review and revise as needed HEP issued at previous episode of care. Cues on form and technique provided. Will plan to go  over remainder of HEP at next session. Per pt his caregiver should be here at the session after next for education for how to assist him at home. The pt should benefit from continued PT to progress toward goals.    Personal Factors and Comorbidities Comorbidity 2    Comorbidities R MCA CVA, Alcohol Dependence, Marijuana/Tobacco Dependence, Back Pain    Examination-Activity Limitations Transfers;Stairs;Stand;Locomotion Level    Stability/Clinical Decision Making Stable/Uncomplicated    Rehab Potential Fair    PT Frequency 2x / week    PT Duration 4 weeks    PT Treatment/Interventions ADLs/Self Care Home Management;Moist Heat;Cryotherapy;DME Instruction;Therapeutic activities;Functional mobility training;Stair training;Gait training;Patient/family education;Balance training;Therapeutic exercise;Orthotic Fit/Training;Neuromuscular re-education;Manual techniques;Dry needling;Passive range of motion    PT Next Visit Plan finish review of HEP from previous episode of care; please teach caregiver if present- will be here on Thursday appt with PT, off on Wednesday    PT Home Exercise Plan Access Code: [email or web address]    Consulted and Agree with Plan of Care Patient             Patient will benefit from skilled therapeutic intervention in order to improve the following deficits and impairments:  Abnormal gait, Decreased balance, Decreased endurance, Decreased mobility, Difficulty walking, Decreased range of motion, Impaired tone, Pain, Postural dysfunction, Decreased strength, Impaired flexibility, Decreased activity tolerance, Decreased coordination, Impaired sensation  Visit Diagnosis: Muscle weakness (generalized)  Unsteadiness on feet  Other abnormalities of gait and mobility     Problem List Patient Active Problem List   Diagnosis Date Noted   Pain due to onychomycosis of toenails of both feet 06/10/2021   Neuropathic pain 04/09/2020   Pain    Spastic hemiparesis (HCC)    History of  hypertension    Dyslipidemia    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

## 2021-12-09 ENCOUNTER — Ambulatory Visit: Payer: BC Managed Care – PPO

## 2021-12-15 ENCOUNTER — Encounter: Payer: Self-pay | Admitting: Occupational Therapy

## 2021-12-15 ENCOUNTER — Ambulatory Visit: Payer: BC Managed Care – PPO | Admitting: Occupational Therapy

## 2021-12-15 ENCOUNTER — Other Ambulatory Visit: Payer: Self-pay

## 2021-12-15 DIAGNOSIS — R2681 Unsteadiness on feet: Secondary | ICD-10-CM | POA: Diagnosis not present

## 2021-12-15 DIAGNOSIS — M79602 Pain in left arm: Secondary | ICD-10-CM | POA: Diagnosis not present

## 2021-12-15 DIAGNOSIS — G8110 Spastic hemiplegia affecting unspecified side: Secondary | ICD-10-CM

## 2021-12-15 DIAGNOSIS — M6281 Muscle weakness (generalized): Secondary | ICD-10-CM | POA: Diagnosis not present

## 2021-12-15 DIAGNOSIS — R41840 Attention and concentration deficit: Secondary | ICD-10-CM

## 2021-12-15 DIAGNOSIS — R2689 Other abnormalities of gait and mobility: Secondary | ICD-10-CM

## 2021-12-15 NOTE — Patient Instructions (Signed)
° °  Self Passive Range of Motion  ° °While lying down, clasp hands together and reach towards headboard for a stretch for your shoulder. Repeat 10 times and do 2-3 times a day. ° °While lying down, clasp hands together and reach side to side for a stretch for your shoulder. Repeat 10 times and do 2-3 times a day. ° °While lying down, clasp hands together and reach up towards the ceiling for a stretch for your shoulder. Repeat 10 times and do 2-3 times a day.  °

## 2021-12-15 NOTE — Therapy (Signed)
[facility] [address] [place], [place], [address] Phone: [phone]   Fax:  [phone]  Occupational Therapy Treatment  Patient Details  Name: Derek [name] MRN: [id number] Date of Birth: November 14, 1975 Referring Provider (OT): Vickey Sages Date: 12/15/2021   OT End of Session - 12/15/21 1459     Visit Number 2    Number of Visits 9    Date for OT Re-Evaluation 01/15/22    Authorization Type BCBS, PT/OT combined = 75 visits    Progress Note Due on Visit 10    OT Start Time 1459   arrival   OT Stop Time 1530    OT Time Calculation (min) 31 min    Behavior During Therapy [facility] for tasks assessed/performed             Past Medical History:  Diagnosis Date   Acute ischemic right MCA stroke ([place]) 01/07/2020   Alcohol dependence ([place])    Back pain    Marijuana dependence ([place])    Tobacco dependence     Past Surgical History:  Procedure Laterality Date   BUBBLE STUDY  01/10/2020   Procedure: BUBBLE STUDY;  Surgeon: Geralynn Rile, MD;  Location: [place];  Service: Cardiovascular;;   TEE WITHOUT CARDIOVERSION N/A 01/10/2020   Procedure: TRANSESOPHAGEAL ECHOCARDIOGRAM (TEE);  Surgeon: Geralynn Rile, MD;  Location: [place];  Service: Cardiovascular;  Laterality: N/A;    There were no vitals filed for this visit.   Subjective Assessment - 12/15/21 1458     Subjective  Pt denies any pain or changes    Currently in Pain? No/denies    Pain Score 0-No pain           No caregiver present.               OT Treatments/Exercises (OP) - 12/15/21 0001       ADLs   UB Dressing doffed open face button up shirt with increased time, donned with increased time and min verbal cues for technique and slowing down      Splinting   Splinting pt reports it is fitting well but did not bring with him today. Asked patient to bring with him next time.                     OT Education - 12/15/21 1532     Education Details review self PROM, asked to bring in splint    Person(s) Educated Patient    Methods Explanation;Demonstration    Comprehension Need further instruction;Returned demonstration;Verbalized understanding              OT Short Term Goals - 12/15/21 1529       OT SHORT TERM GOAL #1   Title Patient will complete a self stretching program for left upper extremity    Baseline XX    Time 4    Period Weeks    Status On-going    Target Date 01/15/22      OT SHORT TERM GOAL #2   Title Patient will don / doff front opening shirt or jacket excluding fasteners with increased time and mod assist    Baseline max-total    Time 4    Period Weeks    Status On-going      OT SHORT TERM GOAL #3   Title Patient will report pain of less than 4/10 with forward low reaching tasks    Baseline Pain 3 at  rest, ranges up to 6-7 with passive movement    Time 4    Period Weeks    Status On-going      OT SHORT TERM GOAL #4   Title Pt to bathe self with mod assist seated    Baseline xx    Period Weeks    Status New      OT SHORT TERM GOAL #5   Title Patient to don/doff splint with mod assist    Baseline xx    Time 4    Period Weeks    Status New               OT Long Term Goals - 12/01/21 1617       OT LONG TERM GOAL #1   Title xxx    Baseline xxx      OT LONG TERM GOAL #2   Title xxx    Baseline xxx      OT LONG TERM GOAL #3   Title xxx    Baseline xxx      OT LONG TERM GOAL #4   Title xxx    Baseline xxx                   Plan - 12/15/21 1533     Clinical Impression Statement Pt verbalized understanding of goals. Pt with increased independence demonstrated with UB dressing and demonstrated understanding of self PROM    OT Occupational Profile and History Detailed Assessment- Review of Records and additional review of physical, cognitive, psychosocial history related to current functional  performance    Occupational performance deficits (Please refer to evaluation for details): ADL's;Leisure;Social Participation    Body Structure / Function / Physical Skills ADL;Decreased knowledge of use of DME;Obesity;Strength;Tone;Pain;GMC;Dexterity;Balance;Body mechanics;Edema;Proprioception;UE functional use;Vestibular;ROM;IADL;Endurance;Vision;Scar mobility;Coordination;Flexibility;Mobility;Sensation;Muscle spasms;FMC;Decreased knowledge of precautions    Cognitive Skills Attention;Emotional;Energy/Drive;Problem Solve;Safety Awareness;Sequencing;Thought    Rehab Potential Fair    Clinical Decision Making Several treatment options, min-mod task modification necessary    Comorbidities Affecting Occupational Performance: May have comorbidities impacting occupational performance    Modification or Assistance to Complete Evaluation  No modification of tasks or assist necessary to complete eval    OT Frequency 2x / week    OT Duration 4 weeks    OT Treatment/Interventions Self-care/ADL training;Moist Heat;Fluidtherapy;DME and/or AE instruction;Splinting;Balance training;Therapeutic activities;Cognitive remediation/compensation;Therapeutic exercise;Ultrasound;Neuromuscular education;Functional Mobility Training;Passive range of motion;Visual/perceptual remediation/compensation;Patient/family education;Manual Therapy;Electrical Stimulation    Plan Patient should bring in splint - need to assess fit and wearing schedule,  If caregiver present - would be beneficial to do HEP - PROM    Consulted and Agree with Plan of Care Patient             Patient will benefit from skilled therapeutic intervention in order to improve the following deficits and impairments:   Body Structure / Function / Physical Skills: ADL, Decreased knowledge of use of DME, Obesity, Strength, Tone, Pain, GMC, Dexterity, Balance, Body mechanics, Edema, Proprioception, UE functional use, Vestibular, ROM, IADL, Endurance, Vision,  Scar mobility, Coordination, Flexibility, Mobility, Sensation, Muscle spasms, FMC, Decreased knowledge of precautions Cognitive Skills: Attention, Emotional, Energy/Drive, Problem Solve, Safety Awareness, Sequencing, Thought     Visit Diagnosis: Muscle weakness (generalized)  Unsteadiness on feet  Pain in left arm  Other abnormalities of gait and mobility  Spastic hemiplegia affecting nondominant side (HCC)  Attention and concentration deficit  Muscle weakness    Problem List Patient Active Problem List   Diagnosis Date Noted   Pain due to onychomycosis of  toenails of both feet 06/10/2021   Neuropathic pain 04/09/2020   Pain    Spastic hemiparesis (HCC)    History of hypertension    Dyslipidemia    Right middle cerebral artery stroke ([place]) 01/15/2020   Leukocytosis 01/12/2020   Cerebrovascular accident (CVA) due to thrombosis of right middle cerebral artery (HCC)    Chronic pain syndrome    Hypokalemia    Dysphagia, post-stroke    Acute ischemic right MCA stroke ([place]) 01/07/2020   Tobacco dependence    Marijuana dependence ([place])    Alcohol dependence ([place])     Zachery Conch, OT 12/15/2021, 3:34 PM  [facility] [address] [place], [place], [address] Phone: [phone]   Fax:  [phone]  Name: CAMRYN EISENREICH MRN: [id number] Date of Birth: 08/14/1976

## 2021-12-17 ENCOUNTER — Other Ambulatory Visit: Payer: Self-pay

## 2021-12-17 ENCOUNTER — Ambulatory Visit: Payer: BC Managed Care – PPO

## 2021-12-17 DIAGNOSIS — R2681 Unsteadiness on feet: Secondary | ICD-10-CM | POA: Diagnosis not present

## 2021-12-17 DIAGNOSIS — R2689 Other abnormalities of gait and mobility: Secondary | ICD-10-CM

## 2021-12-17 DIAGNOSIS — M6281 Muscle weakness (generalized): Secondary | ICD-10-CM | POA: Diagnosis not present

## 2021-12-17 DIAGNOSIS — R41840 Attention and concentration deficit: Secondary | ICD-10-CM | POA: Diagnosis not present

## 2021-12-17 DIAGNOSIS — M79602 Pain in left arm: Secondary | ICD-10-CM | POA: Diagnosis not present

## 2021-12-17 DIAGNOSIS — G8110 Spastic hemiplegia affecting unspecified side: Secondary | ICD-10-CM | POA: Diagnosis not present

## 2021-12-17 NOTE — Therapy (Signed)
[facility] [address] [place], [place], [address] Phone: [phone]   Fax:  [phone]  Physical Therapy Treatment  Patient Details  Name: Derek [name] MRN: [id number] Date of Birth: Nov 18, 1975 Referring Provider (PT): Alger Simons, MD   Encounter Date: 12/17/2021   PT End of Session - 12/17/21 1538     Visit Number 3    Number of Visits 9   4 week POC   Date for PT Re-Evaluation 01/01/22    Authorization Type BCBS (VL PT/OT: 75)    PT Start Time 1536   Pt arrived late to session   PT Stop Time 1614    PT Time Calculation (min) 38 min    Activity Tolerance Patient tolerated treatment well    Behavior During Therapy [facility] for tasks assessed/performed             Past Medical History:  Diagnosis Date   Acute ischemic right MCA stroke ([place]) 01/07/2020   Alcohol dependence ([place])    Back pain    Marijuana dependence ([place])    Tobacco dependence     Past Surgical History:  Procedure Laterality Date   BUBBLE STUDY  01/10/2020   Procedure: BUBBLE STUDY;  Surgeon: Geralynn Rile, MD;  Location: [place];  Service: Cardiovascular;;   TEE WITHOUT CARDIOVERSION N/A 01/10/2020   Procedure: TRANSESOPHAGEAL ECHOCARDIOGRAM (TEE);  Surgeon: Geralynn Rile, MD;  Location: [place];  Service: Cardiovascular;  Laterality: N/A;    There were no vitals filed for this visit.   Subjective Assessment - 12/17/21 1537     Subjective No new changes/complaints. No falls. Caregiver is off at 1:30, therefore did not come to session.    Pertinent History R MCA CVA, Alcohol Dependence, Marijuana/Tobacco Dependence, Back Pain    Limitations Standing;House hold activities;Walking    Patient Stated Goals Improve balance without device; reduce stiffness in the leg    Currently in Pain? No/denies    Pain Onset More than a month ago               [facility] Adult PT Treatment/Exercise - 12/17/21 0001        Transfers   Transfers Sit to Stand;Stand to Sit    Sit to Stand 5: Supervision    Stand to Sit 5: Supervision      Ambulation/Gait   Ambulation/Gait Yes    Ambulation/Gait Assistance 5: Supervision    Ambulation/Gait Assistance Details Gait x 200 ft with hemi-walker and L foot up brace donned. PT faciliating for improved upright posture.    Ambulation Distance (Feet) 200 Feet    Assistive device Large base quad cane    Gait Pattern Step-through pattern;Decreased arm swing - left;Left flexed knee in stance;Decreased weight shift to left;Decreased stance time - left;Decreased step length - right;Decreased step length - left;Lateral trunk lean to right;Poor foot clearance - left    Ambulation Surface Level;Indoor    Gait Comments PT donned foot up brace to patient's new shoes. PT educating on how to properly donn/doff if needed in case changes tennis shoes.      Exercises   Exercises Other Exercises;Knee/Hip    Other Exercises  finished review of HEP (see below for details)      Knee/Hip Exercises: Aerobic   Other Aerobic Due to significant increase in tone/stiffness today: completed SciFit with BLE only for reciprocal movement on Level 2.0 x 6 minutes. Patient tolerating well. Improvements in passive movement noted post completion.  Completed review of the following exercises during session:   Side to Side Weight Shift with Counter Support - 2 x daily - 7 x weekly - 2 sets - 10 reps - cues to shift weight through pelvis/hips vs. Leaning with trunk. PT providing faciliation.   Standing Gastroc Stretch at [employer] - 3 x daily - 7 x weekly - 1 sets - 4 reps - 30 sec hold - max cues required for proper completion.  Hip flexion/extension with resistance - 3 x daily - 7 x weekly - 1 sets - 4 reps - 30 sec hold  Unable to review these with caregiver due them not present at session: Supine Hamstring Stretch with Caregiver - 3 x daily - 7 x weekly - 1 sets - 4 reps - 30 sec  hold Supine Ankle Dorsiflexion Stretch with Caregiver - 3 x daily - 7 x weekly - 1 sets - 4 reps - 30 sec hold      PT Short Term Goals - 12/01/21 1616       PT SHORT TERM GOAL #1   Title = LTGs               PT Long Term Goals - 12/01/21 1617       PT LONG TERM GOAL #1   Title Pt and caregiver will report independence with HEP for strengthening/balance/stretching (ALL LTGs Due: 01/01/22)    Baseline no HEP established    Time 4    Period Weeks    Status New    Target Date 01/01/22      PT LONG TERM GOAL #2   Title Pt will improve 5x sit <> stand to </= 18 secs with single UE support to demo improved balance    Baseline 21.09 secs    Time 4    Period Weeks    Status New      PT LONG TERM GOAL #3   Title Pt will improve gait speed to >/= 1.5 ft/sec with LRAD to demo improved community mobility    Baseline 1.18 ft/sec    Time 4    Period Weeks    Status New      PT LONG TERM GOAL #4   Title Pt will improve TUG to </= 25 seconds to demo improved balance and reudced fall risk    Baseline 29.5 secs with LBQC    Time 4    Period Weeks    Status New      PT LONG TERM GOAL #5   Title Pt will be able to ambulate >/= 500 ft outdoors w/ LRAD and ascend/descend curb iwth supervision to improve community mobility    Baseline TBA    Time 4    Period Weeks    Status New                   Plan - 12/17/21 1602     Clinical Impression Statement Finished review of HEP, except unable to finish review of stretches due to patient caregiver not present. PLan to attend tomorrows session. Completed SciFit for reciprocal movement due to increased tone ntoed. Will continue to progress activities to patient's tolerance.    Personal Factors and Comorbidities Comorbidity 2    Comorbidities R MCA CVA, Alcohol Dependence, Marijuana/Tobacco Dependence, Back Pain    Examination-Activity Limitations Transfers;Stairs;Stand;Locomotion Level    Stability/Clinical Decision Making  Stable/Uncomplicated    Rehab Potential Fair    PT Frequency 2x / week    PT  Duration 4 weeks    PT Treatment/Interventions ADLs/Self Care Home Management;Moist Heat;Cryotherapy;DME Instruction;Therapeutic activities;Functional mobility training;Stair training;Gait training;Patient/family education;Balance training;Therapeutic exercise;Orthotic Fit/Training;Neuromuscular re-education;Manual techniques;Dry needling;Passive range of motion    PT Next Visit Plan teach caregiver stretches. Continue SciFit. Functional strengthening.    PT Home Exercise Plan Access Code: [email or web address]    Consulted and Agree with Plan of Care Patient             Patient will benefit from skilled therapeutic intervention in order to improve the following deficits and impairments:  Abnormal gait, Decreased balance, Decreased endurance, Decreased mobility, Difficulty walking, Decreased range of motion, Impaired tone, Pain, Postural dysfunction, Decreased strength, Impaired flexibility, Decreased activity tolerance, Decreased coordination, Impaired sensation  Visit Diagnosis: Muscle weakness (generalized)  Unsteadiness on feet  Other abnormalities of gait and mobility     Problem List Patient Active Problem List   Diagnosis Date Noted   Pain due to onychomycosis of toenails of both feet 06/10/2021   Neuropathic pain 04/09/2020   Pain    Spastic hemiparesis (HCC)    History of hypertension    Dyslipidemia    Right middle cerebral artery stroke ([place]) 01/15/2020   Leukocytosis 01/12/2020   Cerebrovascular accident (CVA) due to thrombosis of right middle cerebral artery (HCC)    Chronic pain syndrome    Hypokalemia    Dysphagia, post-stroke    Acute ischemic right MCA stroke ([place]) 01/07/2020   Tobacco dependence    Marijuana dependence ([place])    Alcohol dependence ([place])     Jones Bales, PT, DPT 12/17/2021, 4:15 PM  [facility] [address]  [place], [place], [address] Phone: [phone]   Fax:  [phone]  Name: Derek [name] MRN: [id number] Date of Birth: 02-28-1976

## 2021-12-18 ENCOUNTER — Encounter: Payer: Self-pay | Admitting: Occupational Therapy

## 2021-12-18 ENCOUNTER — Ambulatory Visit: Payer: BC Managed Care – PPO | Admitting: Occupational Therapy

## 2021-12-18 ENCOUNTER — Ambulatory Visit: Payer: BC Managed Care – PPO

## 2021-12-18 DIAGNOSIS — R2681 Unsteadiness on feet: Secondary | ICD-10-CM

## 2021-12-18 DIAGNOSIS — R41840 Attention and concentration deficit: Secondary | ICD-10-CM | POA: Diagnosis not present

## 2021-12-18 DIAGNOSIS — M79602 Pain in left arm: Secondary | ICD-10-CM | POA: Diagnosis not present

## 2021-12-18 DIAGNOSIS — G8110 Spastic hemiplegia affecting unspecified side: Secondary | ICD-10-CM

## 2021-12-18 DIAGNOSIS — R2689 Other abnormalities of gait and mobility: Secondary | ICD-10-CM | POA: Diagnosis not present

## 2021-12-18 DIAGNOSIS — M6281 Muscle weakness (generalized): Secondary | ICD-10-CM | POA: Diagnosis not present

## 2021-12-18 NOTE — Patient Instructions (Signed)
Access Code: [email or web address] URL: [email or web address] Date: 12/18/2021 Prepared by: Kallie Edward  Exercises Shoulder Flexion Caregiver PROM - 1 x daily - 7 x weekly - 3 sets - 10 reps Supine Shoulder External Rotation and Internal Rotation PROM with Caregiver - 1 x daily - 7 x weekly - 3 sets - 10 reps Shoulder Abduction Caregiver PROM - 1 x daily - 7 x weekly - 3 sets - 10 reps Wrist Flexion and Extension Caregiver PROM - 1 x daily - 7 x weekly - 3 sets - 10 reps Forearm Pronation and Supination Caregiver PROM - 1 x daily - 7 x weekly - 3 sets - 10 reps Finger Extension with Wrist Extension Caregiver PROM - 1 x daily - 7 x weekly - 3 sets - 10 reps

## 2021-12-18 NOTE — Therapy (Signed)
[facility] [address] [place], [place], [address] Phone: [phone]   Fax:  [phone]  Physical Therapy Treatment  Patient Details  Name: Derek [name] MRN: [id number] Date of Birth: 1976/08/08 Referring Provider (PT): Alger Simons, MD   Encounter Date: 12/18/2021   PT End of Session - 12/18/21 1155     Visit Number 4    Number of Visits 9   4 week POC   Date for PT Re-Evaluation 01/01/22    Authorization Type BCBS (VL PT/OT: 75)    PT Start Time 1146    PT Stop Time 1228    PT Time Calculation (min) 42 min    Activity Tolerance Patient tolerated treatment well    Behavior During Therapy [facility] for tasks assessed/performed             Past Medical History:  Diagnosis Date   Acute ischemic right MCA stroke ([place]) 01/07/2020   Alcohol dependence ([place])    Back pain    Marijuana dependence ([place])    Tobacco dependence     Past Surgical History:  Procedure Laterality Date   BUBBLE STUDY  01/10/2020   Procedure: BUBBLE STUDY;  Surgeon: Geralynn Rile, MD;  Location: [facility];  Service: Cardiovascular;;   TEE WITHOUT CARDIOVERSION N/A 01/10/2020   Procedure: TRANSESOPHAGEAL ECHOCARDIOGRAM (TEE);  Surgeon: Geralynn Rile, MD;  Location: [facility];  Service: Cardiovascular;  Laterality: N/A;    There were no vitals filed for this visit.   Subjective Assessment - 12/18/21 1150     Subjective No new changes/complaints. No falls. Patient report some shoulder pain and hip pain.    Pertinent History R MCA CVA, Alcohol Dependence, Marijuana/Tobacco Dependence, Back Pain    Limitations Standing;House hold activities;Walking    Patient Stated Goals Improve balance without device; reduce stiffness in the leg    Currently in Pain? Yes    Pain Score 4     Pain Location Hip    Pain Orientation Left    Pain Descriptors / Indicators Aching    Pain Type Chronic pain    Pain Onset More than a  month ago                [facility] Adult PT Treatment/Exercise - 12/18/21 0001       Transfers   Transfers Sit to Stand;Stand to Sit    Sit to Stand 5: Supervision    Stand to Sit 5: Supervision      Ambulation/Gait   Ambulation/Gait Yes    Ambulation/Gait Assistance 5: Supervision    Ambulation/Gait Assistance Details throughout therapy session with activities, cues for upright posterior vs. lateral lean    Ambulation Distance (Feet) --   clinic distances   Assistive device Large base quad cane    Gait Pattern Step-through pattern;Decreased arm swing - left;Left flexed knee in stance;Decreased weight shift to left;Decreased stance time - left;Decreased step length - right;Decreased step length - left;Lateral trunk lean to right;Poor foot clearance - left    Ambulation Surface Level;Indoor      Exercises   Exercises Other Exercises    Other Exercises  Completed manual stretching to LLE with patietn in supine, completed single knee to chest stretch 3 x 30 seconds, L hamstring stretch 3 x 30 seconds, and L DF stretch 3 x 30 seconds. PT educating caregiver on proper stretching technique, with caregiver returning demonstrating. Updated handout and provided copy to caregiver for completion at home.  Knee/Hip Exercises: Aerobic   Other Aerobic Completed warm up on SciFit with BLE only for reciprocal movement on Level 2.0 x 6 minutes. Cues for improved knee alignment. Improvements in passive movement noted post completion.      Knee/Hip Exercises: Seated   Heel Slides Strengthening;Left;1 set    Heel Slides Limitations with LLE placed on foam roll, completed heel slides x 15 reps. Cues to avoid ER at hip, faciliation provided.             Access Code: [email or web address] URL: [email or web address] Date: 12/18/2021 Prepared by: Baldomero Lamy  Completed review of the following stretches with Caregiver: Supine Hamstring Stretch with Caregiver - 2 x daily - 5 x weekly - 1  sets - 3 reps - 30 sec hold Supine Ankle Dorsiflexion Stretch with Caregiver - 2 x daily - 5 x weekly - 1 sets - 3 reps - 30 sec hold Single Knee to Chest with Caregiver - 2 x daily - 5 x weekly - 1 sets - 3 reps - 30 seconds hold        PT Education - 12/18/21 1232     Education Details Caregiver Education on [employer]; Handout Provided    Person(s) Educated Patient    Methods Explanation;Demonstration;Handout    Comprehension Verbalized understanding;Returned demonstration              PT Short Term Goals - 12/01/21 1616       PT SHORT TERM GOAL #1   Title = LTGs               PT Long Term Goals - 12/01/21 1617       PT LONG TERM GOAL #1   Title Pt and caregiver will report independence with HEP for strengthening/balance/stretching (ALL LTGs Due: 01/01/22)    Baseline no HEP established    Time 4    Period Weeks    Status New    Target Date 01/01/22      PT LONG TERM GOAL #2   Title Pt will improve 5x sit <> stand to </= 18 secs with single UE support to demo improved balance    Baseline 21.09 secs    Time 4    Period Weeks    Status New      PT LONG TERM GOAL #3   Title Pt will improve gait speed to >/= 1.5 ft/sec with LRAD to demo improved community mobility    Baseline 1.18 ft/sec    Time 4    Period Weeks    Status New      PT LONG TERM GOAL #4   Title Pt will improve TUG to </= 25 seconds to demo improved balance and reudced fall risk    Baseline 29.5 secs with LBQC    Time 4    Period Weeks    Status New      PT LONG TERM GOAL #5   Title Pt will be able to ambulate >/= 500 ft outdoors w/ LRAD and ascend/descend curb iwth supervision to improve community mobility    Baseline TBA    Time 4    Period Weeks    Status New                   Plan - 12/18/21 1239     Clinical Impression Statement Caregiver present for today's session, therefore completed LLE stretching program and educated caregiver and provided handout on  stretching program for compliance at home. Caregiver able to  return demonstration welll with minimal cues. Rest of session focused on continued LLE strengthening. Will continue per POC.    Personal Factors and Comorbidities Comorbidity 2    Comorbidities R MCA CVA, Alcohol Dependence, Marijuana/Tobacco Dependence, Back Pain    Examination-Activity Limitations Transfers;Stairs;Stand;Locomotion Level    Stability/Clinical Decision Making Stable/Uncomplicated    Rehab Potential Fair    PT Frequency 2x / week    PT Duration 4 weeks    PT Treatment/Interventions ADLs/Self Care Home Management;Moist Heat;Cryotherapy;DME Instruction;Therapeutic activities;Functional mobility training;Stair training;Gait training;Patient/family education;Balance training;Therapeutic exercise;Orthotic Fit/Training;Neuromuscular re-education;Manual techniques;Dry needling;Passive range of motion    PT Next Visit Plan How was stretching with caregiver? Continue SciFit. Functional strengthening. Standing Balance    PT Home Exercise Plan Access Code: [email or web address]    Consulted and Agree with Plan of Care Patient             Patient will benefit from skilled therapeutic intervention in order to improve the following deficits and impairments:  Abnormal gait, Decreased balance, Decreased endurance, Decreased mobility, Difficulty walking, Decreased range of motion, Impaired tone, Pain, Postural dysfunction, Decreased strength, Impaired flexibility, Decreased activity tolerance, Decreased coordination, Impaired sensation  Visit Diagnosis: Muscle weakness (generalized)  Unsteadiness on feet  Other abnormalities of gait and mobility     Problem List Patient Active Problem List   Diagnosis Date Noted   Pain due to onychomycosis of toenails of both feet 06/10/2021   Neuropathic pain 04/09/2020   Pain    Spastic hemiparesis (HCC)    History of hypertension    Dyslipidemia    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 [place], [place], [address] Phone: [phone]   Fax:  [phone]  Name: Derek [name] MRN: [id number] Date of Birth: 02-24-1976

## 2021-12-18 NOTE — Patient Instructions (Signed)
Access Code: [email or web address] URL: [email or web address] Date: 12/18/2021 Prepared by: Jethro Bastos  Exercises Supine Bridge - 2 x daily - 5 x weekly - 2 sets - 8 reps Bent Knee Fallouts - 2 x daily - 5 x weekly - 2 sets - 8 reps Supine March with Resistance Band - 1 x daily - 7 x weekly - 3 sets - 10 reps Supine Heel Slide - 2 x daily - 5 x weekly - 2 sets - 8 reps Seated Heel Slide - 1 x daily - 5 x weekly - 2 sets - 8 reps Sit to Stand with Armchair - 1 x daily - 5 x weekly - 3 sets - 5 reps Seated Hamstring Stretch - 3 x daily - 7 x weekly - 1 sets - 3 reps - 30 sec hold Seated Gastroc Stretch with Strap - 3 x daily - 7 x weekly - 1 sets - 3 reps - 30 sec hold Side to Side Weight Shift with Counter Support - 2 x daily - 7 x weekly - 2 sets - 10 reps Standing Gastroc Stretch at Counter - 3 x daily - 7 x weekly - 1 sets - 4 reps - 30 sec hold Hip flexion/extension with resistance - 3 x daily - 7 x weekly - 1 sets - 4 reps - 30 sec hold  Completed review of the following stretches with Caregiver: Supine Hamstring Stretch with Caregiver - 2 x daily - 5 x weekly - 1 sets - 3 reps - 30 sec hold Supine Ankle Dorsiflexion Stretch with Caregiver - 2 x daily - 5 x weekly - 1 sets - 3 reps - 30 sec hold Single Knee to Chest with Caregiver - 2 x daily - 5 x weekly - 1 sets - 3 reps - 30 seconds hold

## 2021-12-18 NOTE — Therapy (Signed)
[facility] [address] [place], [place], [address] Phone: [phone]   Fax:  [phone]  Occupational Therapy Treatment  Patient Details  Name: Derek [name] MRN: [id number] Date of Birth: 1976/08/08 Referring Provider (OT): Vickey Sages Date: 12/18/2021   OT End of Session - 12/18/21 1231     Visit Number 3    Number of Visits 9    Date for OT Re-Evaluation 01/15/22    Authorization Type BCBS, PT/OT combined = 75 visits    Progress Note Due on Visit 10    OT Start Time 1230    OT Stop Time 1315    OT Time Calculation (min) 45 min    Activity Tolerance Patient tolerated treatment well    Behavior During Therapy [facility] for tasks assessed/performed             Past Medical History:  Diagnosis Date   Acute ischemic right MCA stroke ([place]) 01/07/2020   Alcohol dependence ([place])    Back pain    Marijuana dependence ([place])    Tobacco dependence     Past Surgical History:  Procedure Laterality Date   BUBBLE STUDY  01/10/2020   Procedure: BUBBLE STUDY;  Surgeon: Geralynn Rile, MD;  Location: [facility];  Service: Cardiovascular;;   TEE WITHOUT CARDIOVERSION N/A 01/10/2020   Procedure: TRANSESOPHAGEAL ECHOCARDIOGRAM (TEE);  Surgeon: Geralynn Rile, MD;  Location: [facility];  Service: Cardiovascular;  Laterality: N/A;    There were no vitals filed for this visit.   Subjective Assessment - 12/18/21 1230     Subjective  I got my aid with me    Currently in Pain? Yes    Pain Score 4     Pain Location Shoulder    Pain Orientation Left    Pain Descriptors / Indicators Aching    Pain Type Chronic pain    Pain Onset More than a month ago    Pain Frequency Constant                          OT Treatments/Exercises (OP) - 12/18/21 1246       ADLs   Overall ADLs caregiver training with PROM for LUE - caregiver demonstrated understanding    Bathing simulated bathing at  edge of mat with patient hesitant at first but was able to reach BLE, LUE and chest.      Modalities   Modalities Moist Heat      Moist Heat Therapy   Number Minutes Moist Heat 10 Minutes    Moist Heat Location Shoulder   LUE     Splinting   Splinting initiated fabrication of resting hand splint for LUE today - will resume next session                    OT Education - 12/18/21 1251     Education Details Caregiver instructed on PROM - Access Code: [email or web address]    Person(s) Educated Patient;Caregiver(s)    Methods Explanation;Demonstration;Handout    Comprehension Need further instruction;Returned demonstration;Verbalized understanding              OT Short Term Goals - 12/18/21 1300       OT SHORT TERM GOAL #1   Title Patient will complete a self stretching program for left upper extremity    Baseline XX    Time 4    Period Weeks    Status On-going  Target Date 01/15/22      OT SHORT TERM GOAL #2   Title Patient will don / doff front opening shirt or jacket excluding fasteners with increased time and mod assist    Baseline max-total    Time 4    Period Weeks    Status On-going      OT SHORT TERM GOAL #3   Title Patient will report pain of less than 4/10 with forward low reaching tasks    Baseline Pain 3 at rest, ranges up to 6-7 with passive movement    Time 4    Period Weeks    Status On-going      OT SHORT TERM GOAL #4   Title Pt to bathe self with mod assist seated    Baseline xx    Period Weeks    Status On-going      OT SHORT TERM GOAL #5   Title Patient to don/doff splint with mod assist    Baseline xx    Time 4    Period Weeks    Status On-going               OT Long Term Goals - 12/01/21 1617       OT LONG TERM GOAL #1   Title xxx    Baseline xxx      OT LONG TERM GOAL #2   Title xxx    Baseline xxx      OT LONG TERM GOAL #3   Title xxx    Baseline xxx      OT LONG TERM GOAL #4   Title xxx    Baseline xxx                    Plan - 12/18/21 1321     Clinical Impression Statement Pt had caregiver present today and caregiver demonstrated understanding of PROM for HEP. OT initiated fabrication of custom resting hand splint.    OT Occupational Profile and History Detailed Assessment- Review of Records and additional review of physical, cognitive, psychosocial history related to current functional performance    Occupational performance deficits (Please refer to evaluation for details): ADL's;Leisure;Social Participation    Body Structure / Function / Physical Skills ADL;Decreased knowledge of use of DME;Obesity;Strength;Tone;Pain;GMC;Dexterity;Balance;Body mechanics;Edema;Proprioception;UE functional use;Vestibular;ROM;IADL;Endurance;Vision;Scar mobility;Coordination;Flexibility;Mobility;Sensation;Muscle spasms;FMC;Decreased knowledge of precautions    Cognitive Skills Attention;Emotional;Energy/Drive;Problem Solve;Safety Awareness;Sequencing;Thought    Rehab Potential Fair    Clinical Decision Making Several treatment options, min-mod task modification necessary    Comorbidities Affecting Occupational Performance: May have comorbidities impacting occupational performance    Modification or Assistance to Complete Evaluation  No modification of tasks or assist necessary to complete eval    OT Frequency 2x / week    OT Duration 4 weeks    OT Treatment/Interventions Self-care/ADL training;Moist Heat;Fluidtherapy;DME and/or AE instruction;Splinting;Balance training;Therapeutic activities;Cognitive remediation/compensation;Therapeutic exercise;Ultrasound;Neuromuscular education;Functional Mobility Training;Passive range of motion;Visual/perceptual remediation/compensation;Patient/family education;Manual Therapy;Electrical Stimulation    Plan continue fabrication of resting hand splint, review self PROM, ADL strategies    Consulted and Agree with Plan of Care Patient             Patient will benefit  from skilled therapeutic intervention in order to improve the following deficits and impairments:   Body Structure / Function / Physical Skills: ADL, Decreased knowledge of use of DME, Obesity, Strength, Tone, Pain, GMC, Dexterity, Balance, Body mechanics, Edema, Proprioception, UE functional use, Vestibular, ROM, IADL, Endurance, Vision, Scar mobility, Coordination, Flexibility, Mobility, Sensation, Muscle spasms, FMC, Decreased knowledge  of precautions Cognitive Skills: Attention, Emotional, Energy/Drive, Problem Solve, Safety Awareness, Sequencing, Thought     Visit Diagnosis: Muscle weakness (generalized)  Unsteadiness on feet  Pain in left arm  Spastic hemiplegia affecting nondominant side (HCC)  Attention and concentration deficit  Muscle weakness    Problem List Patient Active Problem List   Diagnosis Date Noted   Pain due to onychomycosis of toenails of both feet 06/10/2021   Neuropathic pain 04/09/2020   Pain    Spastic hemiparesis (HCC)    History of hypertension    Dyslipidemia    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 [place], [place], [address] Phone: [phone]   Fax:  [phone]  Name: Derek [name] MRN: [id number] Date of Birth: November 28, 1975

## 2021-12-22 ENCOUNTER — Ambulatory Visit: Payer: BC Managed Care – PPO

## 2021-12-22 ENCOUNTER — Encounter: Payer: Self-pay | Admitting: Occupational Therapy

## 2021-12-22 ENCOUNTER — Ambulatory Visit: Payer: BC Managed Care – PPO | Admitting: Occupational Therapy

## 2021-12-22 ENCOUNTER — Other Ambulatory Visit: Payer: Self-pay

## 2021-12-22 DIAGNOSIS — R2681 Unsteadiness on feet: Secondary | ICD-10-CM

## 2021-12-22 DIAGNOSIS — G8110 Spastic hemiplegia affecting unspecified side: Secondary | ICD-10-CM

## 2021-12-22 DIAGNOSIS — M79602 Pain in left arm: Secondary | ICD-10-CM

## 2021-12-22 DIAGNOSIS — M6281 Muscle weakness (generalized): Secondary | ICD-10-CM

## 2021-12-22 DIAGNOSIS — R2689 Other abnormalities of gait and mobility: Secondary | ICD-10-CM | POA: Diagnosis not present

## 2021-12-22 DIAGNOSIS — R41840 Attention and concentration deficit: Secondary | ICD-10-CM | POA: Diagnosis not present

## 2021-12-22 NOTE — Patient Instructions (Addendum)
°  Splint wear and care instructions from [facility].

## 2021-12-22 NOTE — Therapy (Signed)
[facility] [address] [place], [place], [address] Phone: [phone]   Fax:  [phone]  Physical Therapy Treatment  Patient Details  Name: Derek [name] MRN: [id number] Date of Birth: 1976/01/14 Referring Provider (PT): Alger Simons, MD   Encounter Date: 12/22/2021   PT End of Session - 12/22/21 1405     Visit Number 5    Number of Visits 9   4 week POC   Date for PT Re-Evaluation 01/01/22    Authorization Type BCBS (VL PT/OT: 75)    PT Start Time 1405   Pt using bathroom prior to start of session   PT Stop Time 1444    PT Time Calculation (min) 39 min    Activity Tolerance Patient tolerated treatment well    Behavior During Therapy [facility] for tasks assessed/performed             Past Medical History:  Diagnosis Date   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   Subjective Assessment - 12/22/21 1402     Subjective "I feel very stiff today. Borderline pain in the shoulder/hip area."    Pertinent History R MCA CVA, Alcohol Dependence, Marijuana/Tobacco Dependence, Back Pain    Limitations Standing;House hold activities;Walking    Patient Stated Goals Improve balance without device; reduce stiffness in the leg    Currently in Pain? Yes    Pain Score 3     Pain Location Shoulder    Pain Orientation Left    Pain Descriptors / Indicators Aching    Pain Type Chronic  pain    Pain Onset More than a month ago    Pain Frequency Intermittent    Multiple Pain Sites Yes    Pain Score 4    Pain Location Hip    Pain Orientation Left    Pain Descriptors / Indicators Aching;Tightness    Pain Onset More than a month ago    Pain Frequency Intermittent                [facility] Adult PT Treatment/Exercise - 12/22/21 0001       Transfers   Transfers Sit to Stand;Stand to Sit    Sit to Stand 5: Supervision    Stand to Sit 5: Supervision      Ambulation/Gait   Ambulation/Gait Yes    Ambulation/Gait Assistance 5: Supervision    Ambulation/Gait Assistance Details completed ambulation indoors and outdoors with LB[facility] x 550 ft, PT faciliating for improved posture. Trialed gait without AD x 15 ft.    Ambulation Distance (Feet) 550 Feet    Assistive device Large base quad cane    Gait Pattern Step-through pattern;Decreased arm swing - left;Left flexed knee in stance;Decreased weight shift to left;Decreased stance time - left;Decreased step length - right;Decreased step length - left;Lateral trunk lean to right;Poor foot clearance - left    Ambulation Surface Level;Indoor;Unlevel;Outdoor    Curb 5: Supervision  Curb Details (indicate cue type and reason) completed curb negotiation outdoors on paved surfaces with [facility], completed x 3 reps trialing to reduce UE support with increased balance challenge noted requiring CGA.      Neuro Re-ed    Neuro Re-ed Details  At stairs: completed steps up working toward reduced UE support completed with RLE leading x 10 reps, intermittent CGA and touch A to handrail as needed. Then trialed  leading with LLE, able to place LLE with supervision, mild unsteadiness noted with step up into standing. CGA required intermittently.Flexion of LLE noted, requiring cues to stand tall through LLE      Exercises   Exercises Knee/Hip    Other Exercises  PT completed manual/passive ROM to L Knee Hip for reduction in tone and improved ROM due to  stiffness, completed x 3 minutes.      Knee/Hip Exercises: Aerobic   Other Aerobic Completed SciFit with BLE only for reciprocal movement on Level 3.0 x 5 minutes. Cues for improved hip alignment, and avoid ER.                PT Short Term Goals - 12/01/21 1616       PT SHORT TERM GOAL #1   Title = LTGs               PT Long Term Goals - 12/01/21 1617       PT LONG TERM GOAL #1   Title Pt and caregiver will report independence with HEP for strengthening/balance/stretching (ALL LTGs Due: 01/01/22)    Baseline no HEP established    Time 4    Period Weeks    Status New    Target Date 01/01/22      PT LONG TERM GOAL #2   Title Pt will improve 5x sit <> stand to </= 18 secs with single UE support to demo improved balance    Baseline 21.09 secs    Time 4    Period Weeks    Status New      PT LONG TERM GOAL #3   Title Pt will improve gait speed to >/= 1.5 ft/sec with LRAD to demo improved community mobility    Baseline 1.18 ft/sec    Time 4    Period Weeks    Status New      PT LONG TERM GOAL #4   Title Pt will improve TUG to </= 25 seconds to demo improved balance and reudced fall risk    Baseline 29.5 secs with [facility]    Time 4    Period Weeks    Status New      PT LONG TERM GOAL #5   Title Pt will be able to ambulate >/= 500 ft outdoors w/ LRAD and ascend/descend curb iwth supervision to improve community mobility    Baseline TBA    Time 4    Period Weeks    Status New                   Plan - 12/22/21 1459     Clinical Impression Statement Continued gait training with PT faciliating for improved posture/alignment with ambulation. Completed gait outdoors with [facility] and curb negotiation. Increased challenge without UE support. Transitioned to step up with reduced UE support to work on balance and curb negotiation. Continued activites for reduction in tone/passive movement. WIll continue per POC.    Personal Factors and Comorbidities Comorbidity 2     Comorbidities R MCA CVA, Alcohol Dependence,  Marijuana/Tobacco Dependence, Back Pain    Examination-Activity Limitations Transfers;Stairs;Stand;Locomotion Level    Stability/Clinical Decision Making Stable/Uncomplicated    Rehab Potential Fair    PT Frequency 2x / week    PT Duration 4 weeks    PT Treatment/Interventions ADLs/Self Care Home Management;Moist Heat;Cryotherapy;DME Instruction;Therapeutic activities;Functional mobility training;Stair training;Gait training;Patient/family education;Balance training;Therapeutic exercise;Orthotic Fit/Training;Neuromuscular re-education;Manual techniques;Dry needling;Passive range of motion    PT Next Visit Plan Continue SciFit. Continue working on alignment with gait (potentital to trial various SPC to determine if we can move to smaller AD). Functional strengthening. Standing Balance    PT Home Exercise Plan Access Code: [email or web address]    Consulted and Agree with Plan of Care Patient             Patient will benefit from skilled therapeutic intervention in order to improve the following deficits and impairments:  Abnormal gait, Decreased balance, Decreased endurance, Decreased mobility, Difficulty walking, Decreased range of motion, Impaired tone, Pain, Postural dysfunction, Decreased strength, Impaired flexibility, Decreased activity tolerance, Decreased coordination, Impaired sensation  Visit Diagnosis: Muscle weakness (generalized)  Unsteadiness on feet  Other abnormalities of gait and mobility     Problem List Patient Active Problem List   Diagnosis Date Noted   Pain due to onychomycosis of toenails of both feet 06/10/2021   Neuropathic pain 04/09/2020   Pain    Spastic hemiparesis (HCC)    History of hypertension    Dyslipidemia    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 [place], [place], [address] Phone: [phone]   Fax:  [phone]  Name: Derek [name] MRN: [id number] Date of Birth: 12-27-1975

## 2021-12-22 NOTE — Therapy (Signed)
[facility] [address] [place], [place], [address] Phone: [phone]   Fax:  [phone]  Occupational Therapy Treatment  Patient Details  Name: DEO LATINI MRN: [id number] Date of Birth: 05-26-1976 Referring Provider (OT): Vickey Sages Date: 12/22/2021   OT End of Session - 12/22/21 1533     Visit Number 4    Number of Visits 9    Date for OT Re-Evaluation 01/15/22    Authorization Type BCBS, PT/OT combined = 75 visits    Progress Note Due on Visit 10    OT Start Time 1445    OT Stop Time 1530    OT Time Calculation (min) 45 min    Activity Tolerance Patient tolerated treatment well    Behavior During Therapy [facility] for tasks assessed/performed             Past Medical History:  Diagnosis Date   Acute ischemic right MCA stroke ([place]) 01/07/2020   Alcohol dependence ([place])    Back pain    Marijuana dependence ([place])    Tobacco dependence     Past Surgical History:  Procedure Laterality Date   BUBBLE STUDY  01/10/2020   Procedure: BUBBLE STUDY;  Surgeon: Geralynn Rile, MD;  Location: [facility];  Service: Cardiovascular;;   TEE WITHOUT CARDIOVERSION N/A 01/10/2020   Procedure: TRANSESOPHAGEAL ECHOCARDIOGRAM (TEE);  Surgeon: Geralynn Rile, MD;  Location: [facility];  Service: Cardiovascular;  Laterality: N/A;    There were no vitals filed for this visit.   Subjective Assessment - 12/22/21 1532     Subjective  Pt denies any pain    Currently in Pain? No/denies    Pain Score 0-No pain                          OT Treatments/Exercises (OP) - 12/22/21 1531       Splinting   Splinting fabricated custom resting hand splint for LUE - reviewed wear and care insturctions and increasing wearing tolerance                    OT Education - 12/22/21 1531     Education Details splint wear and care instructions    Person(s) Educated Patient    Methods  Explanation;Demonstration    Comprehension Verbalized understanding              OT Short Term Goals - 12/18/21 1300       OT SHORT TERM GOAL #1   Title Patient will complete a self stretching program for left upper extremity    Baseline XX    Time 4    Period Weeks    Status On-going    Target Date 01/15/22      OT SHORT TERM GOAL #2   Title Patient will don / doff front opening shirt or jacket excluding fasteners with increased time and mod assist    Baseline max-total    Time 4    Period Weeks    Status On-going      OT SHORT TERM GOAL #3   Title Patient will report pain of less than 4/10 with forward low reaching tasks    Baseline Pain 3 at rest, ranges up to 6-7 with passive movement    Time 4    Period Weeks    Status On-going      OT SHORT TERM GOAL #4   Title Pt to bathe  self with mod assist seated    Baseline xx    Period Weeks    Status On-going      OT SHORT TERM GOAL #5   Title Patient to don/doff splint with mod assist    Baseline xx    Time 4    Period Weeks    Status On-going               OT Long Term Goals - 12/01/21 1617       OT LONG TERM GOAL #1   Title xxx    Baseline xxx      OT LONG TERM GOAL #2   Title xxx    Baseline xxx      OT LONG TERM GOAL #3   Title xxx    Baseline xxx      OT LONG TERM GOAL #4   Title xxx    Baseline xxx                   Plan - 12/22/21 1534     Clinical Impression Statement Fabricated resting hand splint for LUE. Verbalized understanding of wear and care instructions.    OT Occupational Profile and History Detailed Assessment- Review of Records and additional review of physical, cognitive, psychosocial history related to current functional performance    Occupational performance deficits (Please refer to evaluation for details): ADL's;Leisure;Social Participation    Body Structure / Function / Physical Skills ADL;Decreased knowledge of use of  DME;Obesity;Strength;Tone;Pain;GMC;Dexterity;Balance;Body mechanics;Edema;Proprioception;UE functional use;Vestibular;ROM;IADL;Endurance;Vision;Scar mobility;Coordination;Flexibility;Mobility;Sensation;Muscle spasms;FMC;Decreased knowledge of precautions    Cognitive Skills Attention;Emotional;Energy/Drive;Problem Solve;Safety Awareness;Sequencing;Thought    Rehab Potential Fair    Clinical Decision Making Several treatment options, min-mod task modification necessary    Comorbidities Affecting Occupational Performance: May have comorbidities impacting occupational performance    Modification or Assistance to Complete Evaluation  No modification of tasks or assist necessary to complete eval    OT Frequency 2x / week    OT Duration 4 weeks    OT Treatment/Interventions Self-care/ADL training;Moist Heat;Fluidtherapy;DME and/or AE instruction;Splinting;Balance training;Therapeutic activities;Cognitive remediation/compensation;Therapeutic exercise;Ultrasound;Neuromuscular education;Functional Mobility Training;Passive range of motion;Visual/perceptual remediation/compensation;Patient/family education;Manual Therapy;Electrical Stimulation    Plan see how splint is doing - make adjustments PRN    Consulted and Agree with Plan of Care Patient             Patient will benefit from skilled therapeutic intervention in order to improve the following deficits and impairments:   Body Structure / Function / Physical Skills: ADL, Decreased knowledge of use of DME, Obesity, Strength, Tone, Pain, GMC, Dexterity, Balance, Body mechanics, Edema, Proprioception, UE functional use, Vestibular, ROM, IADL, Endurance, Vision, Scar mobility, Coordination, Flexibility, Mobility, Sensation, Muscle spasms, FMC, Decreased knowledge of precautions Cognitive Skills: Attention, Emotional, Energy/Drive, Problem Solve, Safety Awareness, Sequencing, Thought     Visit Diagnosis: Muscle weakness (generalized)  Unsteadiness on  feet  Pain in left arm  Spastic hemiplegia affecting nondominant side (HCC)  Attention and concentration deficit  Muscle weakness  Other abnormalities of gait and mobility    Problem List Patient Active Problem List   Diagnosis Date Noted   Pain due to onychomycosis of toenails of both feet 06/10/2021   Neuropathic pain 04/09/2020   Pain    Spastic hemiparesis (HCC)    History of hypertension    Dyslipidemia    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 [place], [place], [address] Phone: [phone]   Fax:  [phone]  Name: TSUTOMU PELLEY MRN: [id number] Date of Birth: [birth year]

## 2021-12-25 ENCOUNTER — Ambulatory Visit: Payer: BC Managed Care – PPO | Admitting: Physical Therapy

## 2021-12-25 ENCOUNTER — Ambulatory Visit: Payer: BC Managed Care – PPO | Admitting: Occupational Therapy

## 2021-12-25 ENCOUNTER — Other Ambulatory Visit: Payer: Self-pay

## 2021-12-25 ENCOUNTER — Encounter: Payer: Self-pay | Admitting: Occupational Therapy

## 2021-12-25 ENCOUNTER — Encounter: Payer: Self-pay | Admitting: Physical Therapy

## 2021-12-25 DIAGNOSIS — G8110 Spastic hemiplegia affecting unspecified side: Secondary | ICD-10-CM

## 2021-12-25 DIAGNOSIS — R2689 Other abnormalities of gait and mobility: Secondary | ICD-10-CM

## 2021-12-25 DIAGNOSIS — R2681 Unsteadiness on feet: Secondary | ICD-10-CM

## 2021-12-25 DIAGNOSIS — R41840 Attention and concentration deficit: Secondary | ICD-10-CM

## 2021-12-25 DIAGNOSIS — M6281 Muscle weakness (generalized): Secondary | ICD-10-CM

## 2021-12-25 DIAGNOSIS — M79602 Pain in left arm: Secondary | ICD-10-CM

## 2021-12-25 NOTE — Therapy (Signed)
[facility] [address] [place], [place], [address] Phone: [phone]   Fax:  [phone]  Occupational Therapy Treatment  Patient Details  Name: Derek [name] MRN: [id number] Date of Birth: Mar 05, 1976 Referring Provider (OT): Vickey Sages Date: 12/25/2021   OT End of Session - 12/25/21 1528     Visit Number 5    Number of Visits 9    Date for OT Re-Evaluation 01/15/22    Authorization Type BCBS, PT/OT combined = 75 visits    Progress Note Due on Visit 10    OT Start Time 1400    OT Stop Time 1445    OT Time Calculation (min) 45 min    Activity Tolerance Patient tolerated treatment well    Behavior During Therapy [facility] for tasks assessed/performed             Past Medical History:  Diagnosis Date   Acute ischemic right MCA stroke ([place]) 01/07/2020   Alcohol dependence ([place])    Back pain    Marijuana dependence ([place])    Tobacco dependence     Past Surgical History:  Procedure Laterality Date   BUBBLE STUDY  01/10/2020   Procedure: BUBBLE STUDY;  Surgeon: Geralynn Rile, MD;  Location: [facility];  Service: Cardiovascular;;   TEE WITHOUT CARDIOVERSION N/A 01/10/2020   Procedure: TRANSESOPHAGEAL ECHOCARDIOGRAM (TEE);  Surgeon: Geralynn Rile, MD;  Location: [facility];  Service: Cardiovascular;  Laterality: N/A;    There were no vitals filed for this visit.   Subjective Assessment - 12/25/21 1404     Subjective  "the sky is blue"    Patient is accompanied by: Family member    Currently in Pain? Yes    Pain Score 3     Pain Location Shoulder    Pain Orientation Left    Pain Descriptors / Indicators Aching    Pain Type Chronic pain    Pain Onset More than a month ago    Pain Frequency Constant             Reviewed self P/ROM in sitting and supine. Pt demonstrated understanding with mod verbal cues.   UB dresing with min A for threading LUE and pulling down in back.  Pt able to doff with increased time  ADLs encouraged patient to increase independence and participation in ADLs as patient is reporting aid and spouse are doing it all for him. Pt has demonstrated ability to complete these tasks with less assistance in the clinic.   Sidelying with bodyweight on LUE on left side with AROM for elbow flexion and extension                         OT Short Term Goals - 12/25/21 1406       OT SHORT TERM GOAL #1   Title Patient will complete a self stretching program for left upper extremity    Baseline XX    Time 4    Period Weeks    Status On-going   reports going well at home - continue to review   Target Date 01/15/22      OT SHORT TERM GOAL #2   Title Patient will don / doff front opening shirt or jacket excluding fasteners with increased time and mod assist    Baseline max-total    Time 4    Period Weeks    Status On-going   completes with min A  today but reports aid does it at home 12/25/21     OT [place] #3   Title Patient will report pain of less than 4/10 with forward low reaching tasks    Baseline Pain 3 at rest, ranges up to 6-7 with passive movement    Time 4    Period Weeks    Status On-going   little active reaching with LUE     OT SHORT TERM GOAL #4   Title Pt to bathe self with mod assist seated    Baseline xx    Period Weeks    Status On-going      OT SHORT TERM GOAL #5   Title Patient to don/doff splint with mod assist    Baseline xx    Time 4    Period Weeks    Status On-going   min A today 12/25/21              OT Long Term Goals - 12/01/21 1617       OT LONG TERM GOAL #1   Title xxx    Baseline xxx      OT LONG TERM GOAL #2   Title xxx    Baseline xxx      OT LONG TERM GOAL #3   Title xxx    Baseline xxx      OT LONG TERM GOAL #4   Title xxx    Baseline xxx                   Plan - 12/25/21 1531     Clinical Impression Statement Encouraged patient for  increasing participation in ADLs. Pt verbalized understanding.    OT Occupational Profile and History Detailed Assessment- Review of Records and additional review of physical, cognitive, psychosocial history related to current functional performance    Occupational performance deficits (Please refer to evaluation for details): ADL's;Leisure;Social Participation    Body Structure / Function / Physical Skills ADL;Decreased knowledge of use of DME;Obesity;Strength;Tone;Pain;GMC;Dexterity;Balance;Body mechanics;Edema;Proprioception;UE functional use;Vestibular;ROM;IADL;Endurance;Vision;Scar mobility;Coordination;Flexibility;Mobility;Sensation;Muscle spasms;FMC;Decreased knowledge of precautions    Cognitive Skills Attention;Emotional;Energy/Drive;Problem Solve;Safety Awareness;Sequencing;Thought    Rehab Potential Fair    Clinical Decision Making Several treatment options, min-mod task modification necessary    Comorbidities Affecting Occupational Performance: May have comorbidities impacting occupational performance    Modification or Assistance to Complete Evaluation  No modification of tasks or assist necessary to complete eval    OT Frequency 2x / week    OT Duration 4 weeks    OT Treatment/Interventions Self-care/ADL training;Moist Heat;Fluidtherapy;DME and/or AE instruction;Splinting;Balance training;Therapeutic activities;Cognitive remediation/compensation;Therapeutic exercise;Ultrasound;Neuromuscular education;Functional Mobility Training;Passive range of motion;Visual/perceptual remediation/compensation;Patient/family education;Manual Therapy;Electrical Stimulation    Plan encourage more partiicpation in ADLs, review self PROM and review ADL strategies    Consulted and Agree with Plan of Care Patient             Patient will benefit from skilled therapeutic intervention in order to improve the following deficits and impairments:   Body Structure / Function / Physical Skills: ADL, Decreased  knowledge of use of DME, Obesity, Strength, Tone, Pain, GMC, Dexterity, Balance, Body mechanics, Edema, Proprioception, UE functional use, Vestibular, ROM, IADL, Endurance, Vision, Scar mobility, Coordination, Flexibility, Mobility, Sensation, Muscle spasms, FMC, Decreased knowledge of precautions Cognitive Skills: Attention, Emotional, Energy/Drive, Problem Solve, Safety Awareness, Sequencing, Thought     Visit Diagnosis: Muscle weakness (generalized)  Unsteadiness on feet  Other abnormalities of gait and mobility  Pain in left arm  Spastic hemiplegia affecting nondominant side (HCC)  Attention and concentration deficit  Muscle weakness    Problem List Patient Active Problem List   Diagnosis Date Noted   Pain due to onychomycosis of toenails of both feet 06/10/2021   Neuropathic pain 04/09/2020   Pain    Spastic hemiparesis (HCC)    History of hypertension    Dyslipidemia    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)    Alcohol dependence ([place])     Zachery Conch, OT 12/25/2021, 3:32 PM  [facility] [address] [place], [place], [address] Phone: [phone]   Fax:  [phone]  Name: EHAAN STAGNARO MRN: [id number] Date of Birth: [birth year]

## 2021-12-27 NOTE — Therapy (Signed)
[facility] [address] [place], [place], [address] Phone: [phone]   Fax:  [phone]  Physical Therapy Treatment  Patient Details  Name: Derek [name] MRN: [id number] Date of Birth: Mar 12, 1976 Referring Provider (PT): Alger Simons, MD   Encounter Date: 12/25/2021    12/25/21 1452  PT Visits / Re-Eval  Visit Number 6  Number of Visits 9 (4 week POC)  Date for PT Re-Evaluation 01/01/22  Authorization  Authorization Type BCBS (VL PT/OT: 75)  PT Time Calculation  PT Start Time 1449  PT Stop Time 1529  PT Time Calculation (min) 40 min  PT - End of Session  Activity Tolerance Patient tolerated treatment well  Behavior During Therapy [facility] for tasks assessed/performed    Past Medical History:  Diagnosis Date   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     12/25/21 1449  Symptoms/Limitations  Subjective No new complaints. No falls. Still with stiffness/tighness.  Pertinent History R MCA CVA, Alcohol Dependence, Marijuana/Tobacco Dependence, Back Pain  Limitations Standing;House hold activities;Walking  Patient Stated Goals Improve balance without device; reduce stiffness in the leg  Pain Assessment  Currently in Pain? Yes  Pain Score 3  Pain Location Hip  Pain Orientation Left  Pain Descriptors / Indicators Sore;Tightness  Pain Type Chronic pain  Pain Onset  More than a month ago  Pain Frequency Intermittent  Aggravating Factors  pt unsure, ? tone, immobility  Pain Relieving Factors movement sometimes, stretching     12/25/21 1453  Transfers  Transfers Sit to Stand;Stand to Sit  Sit to Stand 5: Supervision  Stand to Sit 5: Supervision  Ambulation/Gait  Ambulation/Gait Yes  Ambulation/Gait Assistance 5: Supervision  Ambulation/Gait Assistance Details cues for posture, increased left stance time and right step length.  Ambulation Distance (Feet)  (around clinic with session)  Assistive device Large base quad cane  Gait Pattern Step-through pattern;Decreased arm swing - left;Left flexed knee in stance;Decreased weight shift to left;Decreased stance time - left;Decreased step length - right;Decreased step length - left;Lateral trunk lean to right;Poor foot clearance - left  Ambulation Surface Level;Indoor  Self-Care  Self-Care Other Self-Care Comments  Other Self-Care Comments  pt reports thinking about joining the [facility]. wanted to know if insurane will cover a membership. Advised pt to call his policy and see if they offer coverage. pt and family member that was with him  stated they would do this.  Exercises  Exercises Other Exercises  Other Exercises  supine on mat table: passive left hamstring stretching for 2 minutes with gradual increase as muscles relaxed. lower trunk rotaiton stretch x 3 reps each way for 30 seconds each; seated at edge of mat: feet in staggered stance with left foot back/right foor forward for sit<>stands fro 2 sets of 5 reps with single UE support/min guard assist for safety.  Knee/Hip Exercises: Aerobic  Other Aerobic Completed SciFit with BLE only for reciprocal movement  on Level 3.0 x 5 minutes. Cues for improved hip alignment, and avoid ER.          PT Short Term Goals - 12/01/21 1616       PT SHORT TERM GOAL #1   Title = LTGs               PT Long Term Goals - 12/01/21 1617       PT LONG TERM  GOAL #1   Title Pt and caregiver will report independence with HEP for strengthening/balance/stretching (ALL LTGs Due: 01/01/22)    Baseline no HEP established    Time 4    Period Weeks    Status New    Target Date 01/01/22      PT LONG TERM GOAL #2   Title Pt will improve 5x sit <> stand to </= 18 secs with single UE support to demo improved balance    Baseline 21.09 secs    Time 4    Period Weeks    Status New      PT LONG TERM GOAL #3   Title Pt will improve gait speed to >/= 1.5 ft/sec with LRAD to demo improved community mobility    Baseline 1.18 ft/sec    Time 4    Period Weeks    Status New      PT LONG TERM GOAL #4   Title Pt will improve TUG to </= 25 seconds to demo improved balance and reudced fall risk    Baseline 29.5 secs with LBQC    Time 4    Period Weeks    Status New      PT LONG TERM GOAL #5   Title Pt will be able to ambulate >/= 500 ft outdoors w/ LRAD and ascend/descend curb iwth supervision to improve community mobility    Baseline TBA    Time 4    Period Weeks    Status New               12/25/21 1452  Plan  Clinical Impression Statement Today's skilled session continued with stretching to address muscle tightness/tone, strengthening and gait with quad cane with emphasis on increased left stance time/right step length. No issues noted or reported in session. The pt should benefit from continued PT to progress toward unmet goals.  Personal Factors and Comorbidities Comorbidity 2  Comorbidities R MCA CVA, Alcohol Dependence, Marijuana/Tobacco Dependence, Back Pain  Examination-Activity Limitations Transfers;Stairs;Stand;Locomotion Level  Pt will benefit from skilled therapeutic intervention in order to improve on the following deficits Abnormal gait;Decreased balance;Decreased endurance;Decreased mobility;Difficulty walking;Decreased range of motion;Impaired tone;Pain;Postural dysfunction;Decreased strength;Impaired flexibility;Decreased activity  tolerance;Decreased coordination;Impaired sensation  Stability/Clinical Decision Making Stable/Uncomplicated  Rehab Potential Fair  PT Frequency 2x / week  PT Duration 4 weeks  PT Treatment/Interventions ADLs/Self Care Home Management;Moist Heat;Cryotherapy;DME Instruction;Therapeutic activities;Functional mobility training;Stair training;Gait training;Patient/family education;Balance training;Therapeutic exercise;Orthotic Fit/Training;Neuromuscular re-education;Manual techniques;Dry needling;Passive range of motion  PT Next Visit Plan Continue SciFit. Continue working on alignment with gait (potentital to trial various SPC to determine if we can move to smaller AD). Functional strengthening. Standing Balance  PT Home Exercise Plan Access Code: [email or web address]  Consulted and Agree with Plan of Care Patient         Patient will benefit from skilled therapeutic intervention in order to improve the following deficits and impairments:  Abnormal gait, Decreased balance, Decreased endurance, Decreased mobility, Difficulty walking, Decreased range of motion, Impaired tone, Pain, Postural dysfunction, Decreased strength, Impaired flexibility, Decreased activity tolerance, Decreased  coordination, Impaired sensation  Visit Diagnosis: Muscle weakness (generalized)  Unsteadiness on feet  Other abnormalities of gait and mobility     Problem List Patient Active Problem List   Diagnosis Date Noted   Pain due to onychomycosis of toenails of both feet 06/10/2021   Neuropathic pain 04/09/2020   Pain    Spastic hemiparesis (HCC)    History of hypertension    Dyslipidemia    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

## 2021-12-30 ENCOUNTER — Other Ambulatory Visit: Payer: Self-pay

## 2021-12-30 ENCOUNTER — Ambulatory Visit: Payer: BC Managed Care – PPO | Attending: Family Medicine | Admitting: Occupational Therapy

## 2021-12-30 ENCOUNTER — Ambulatory Visit: Payer: BC Managed Care – PPO

## 2021-12-30 ENCOUNTER — Encounter: Payer: Self-pay | Admitting: Occupational Therapy

## 2021-12-30 DIAGNOSIS — M79602 Pain in left arm: Secondary | ICD-10-CM | POA: Insufficient documentation

## 2021-12-30 DIAGNOSIS — R2681 Unsteadiness on feet: Secondary | ICD-10-CM

## 2021-12-30 DIAGNOSIS — R293 Abnormal posture: Secondary | ICD-10-CM | POA: Insufficient documentation

## 2021-12-30 DIAGNOSIS — M6281 Muscle weakness (generalized): Secondary | ICD-10-CM

## 2021-12-30 DIAGNOSIS — G8110 Spastic hemiplegia affecting unspecified side: Secondary | ICD-10-CM

## 2021-12-30 DIAGNOSIS — R41840 Attention and concentration deficit: Secondary | ICD-10-CM | POA: Diagnosis not present

## 2021-12-30 DIAGNOSIS — R208 Other disturbances of skin sensation: Secondary | ICD-10-CM | POA: Insufficient documentation

## 2021-12-30 DIAGNOSIS — R2689 Other abnormalities of gait and mobility: Secondary | ICD-10-CM | POA: Insufficient documentation

## 2021-12-30 NOTE — Therapy (Signed)
[facility] [address] [place], [place], [address] Phone: [phone]   Fax:  [phone]  Physical Therapy Treatment  Patient Details  Name: Derek [name] MRN: [id number] Date of Birth: 1976/01/28 Referring Provider (PT): Alger Simons, MD   Encounter Date: 12/30/2021   PT End of Session - 12/30/21 1402     Visit Number 7    Number of Visits 9   4 week POC   Date for PT Re-Evaluation 01/01/22    Authorization Type BCBS (VL PT/OT: 64)    PT Start Time 1402    PT Stop Time 1443    PT Time Calculation (min) 41 min    Activity Tolerance Patient tolerated treatment well    Behavior During Therapy [facility] for tasks assessed/performed             Past Medical History:  Diagnosis Date   Acute ischemic right MCA stroke ([place]) 01/07/2020   Alcohol dependence ([place])    Back pain    Marijuana dependence ([place])    Tobacco dependence     Past Surgical History:  Procedure Laterality Date   BUBBLE STUDY  01/10/2020   Procedure: BUBBLE STUDY;  Surgeon: Geralynn Rile, MD;  Location: [place];  Service: Cardiovascular;;   TEE WITHOUT CARDIOVERSION N/A 01/10/2020   Procedure: TRANSESOPHAGEAL ECHOCARDIOGRAM (TEE);  Surgeon: Geralynn Rile, MD;  Location: [place];  Service: Cardiovascular;  Laterality: N/A;    There were no vitals filed for this visit.   Subjective Assessment - 12/30/21 1405     Subjective Patient reports no new changes/complaints. Wearing new splint OT gave him. No pain to report.    Pertinent History R MCA CVA, Alcohol Dependence, Marijuana/Tobacco Dependence, Back Pain    Limitations Standing;House hold activities;Walking    Patient Stated Goals Improve balance without device; reduce stiffness in the leg    Currently in Pain? Yes    Pain Score 3     Pain Location Hip    Pain Orientation Left    Pain Descriptors / Indicators Tightness;Sore    Pain Type Chronic pain    Pain  Onset More than a month ago                [facility] PT Assessment - 12/30/21 0001       Timed Up and Go Test   TUG Normal TUG    Normal TUG (seconds) 26.58    TUG Comments secs with [facility]              [facility] Adult PT Treatment/Exercise - 12/30/21 0001       Transfers   Transfers Sit to Stand;Stand to Sit    Sit to Stand 5: Supervision    Five time sit to stand comments  19.88 secs with single UE support, cues for full standing    Stand to Sit 5: Supervision      Ambulation/Gait   Ambulation/Gait Yes    Ambulation/Gait Assistance 5: Supervision    Ambulation/Gait Assistance Details completed gait outdoors with 650 ft supervision level with LBQC, no instances of imbalance noted. Trialed gait with SPC with quad tip x 200 ft in session, patient initially feeling unsteady with device due to more narrow BOS. But no significant gait changes or increased imbalance noted with completion. PT providing extensive education on process of reducing BOS with AD to promote improved tolerance/balance with ambulation to progress toward no AD use. Pt hesistant but will consider use of SPC  with quad tip. Will rreport back at next session.    Ambulation Distance (Feet) 650 Feet   x 1, 200 x 1   Assistive device Large base quad cane    Gait Pattern Step-through pattern;Decreased arm swing - left;Left flexed knee in stance;Decreased weight shift to left;Decreased stance time - left;Decreased step length - right;Decreased step length - left;Lateral trunk lean to right;Poor foot clearance - left    Ambulation Surface Level;Indoor    Gait velocity 23.04 secs = 1.42 ft/sec    Curb 5: Supervision;6: Modified independent (Device/increase time)    Curb Details (indicate cue type and reason) able to ascend/descend curb with AD, supervision - Mod I. No cues for sequencing required.               PT Education - 12/30/21 1437     Education Details progress toward LTGs; planned d/c at next visit     Person(s) Educated Patient    Methods Explanation    Comprehension Verbalized understanding              PT Short Term Goals - 12/01/21 1616       PT SHORT TERM GOAL #1   Title = LTGs               PT Long Term Goals - 12/30/21 1420       PT LONG TERM GOAL #1   Title Pt and caregiver will report independence with HEP for strengthening/balance/stretching (ALL LTGs Due: 01/01/22)    Baseline no HEP established    Time 4    Period Weeks    Status New    Target Date 01/01/22      PT LONG TERM GOAL #2   Title Pt will improve 5x sit <> stand to </= 18 secs with single UE support to demo improved balance    Baseline 21.09 secs; 19.88 secs    Time 4    Period Weeks    Status Not Met      PT LONG TERM GOAL #3   Title Pt will improve gait speed to >/= 1.5 ft/sec with LRAD to demo improved community mobility    Baseline 1.18 ft/sec 1.42 ft/sec    Time 4    Period Weeks    Status Not Met      PT LONG TERM GOAL #4   Title Pt will improve TUG to </= 25 seconds to demo improved balance and reudced fall risk    Baseline 29.5 secs with LBQC; 26.58 secs with LBQC    Time 4    Period Weeks    Status Not Met      PT LONG TERM GOAL #5   Title Pt will be able to ambulate >/= 500 ft outdoors w/ LRAD and ascend/descend curb iwth supervision to improve community mobility    Baseline TBA; 600 ft with supervision outdoors [facility]. able to ascend/descend curb with AD supervision    Time 4    Period Weeks    Status Achieved                   Plan - 12/30/21 1457     Clinical Impression Statement Began assesment of patient's progress toward LTGs. Patient able to meet LTG #5 with ambulation outdoors and with curb negotiation. However unable to meet LTG #2-4 at this time demonstrating limited progress with PT services. Trialed gait with SPC with quad tip, patient feeling hesistant to use AD but no significant gait  changes or imbalance noted. PT provided extensive education on  needing to be more active at home, complaint with HEP, and engaging in walking program to see functional improvements. Will plan to d/c at next visit.    Personal Factors and Comorbidities Comorbidity 2    Comorbidities R MCA CVA, Alcohol Dependence, Marijuana/Tobacco Dependence, Back Pain    Examination-Activity Limitations Transfers;Stairs;Stand;Locomotion Level    Stability/Clinical Decision Making Stable/Uncomplicated    Rehab Potential Fair    PT Frequency 2x / week    PT Duration 4 weeks    PT Treatment/Interventions ADLs/Self Care Home Management;Moist Heat;Cryotherapy;DME Instruction;Therapeutic activities;Functional mobility training;Stair training;Gait training;Patient/family education;Balance training;Therapeutic exercise;Orthotic Fit/Training;Neuromuscular re-education;Manual techniques;Dry needling;Passive range of motion    PT Next Visit Plan Review HEP and update if needed. Give information on [facility] with quad tip if patient is interested. Provide walking program handout. Meyer Cory D/C Visit.    PT Home Exercise Plan Access Code: [email or web address]    Consulted and Agree with Plan of Care Patient             Patient will benefit from skilled therapeutic intervention in order to improve the following deficits and impairments:  Abnormal gait, Decreased balance, Decreased endurance, Decreased mobility, Difficulty walking, Decreased range of motion, Impaired tone, Pain, Postural dysfunction, Decreased strength, Impaired flexibility, Decreased activity tolerance, Decreased coordination, Impaired sensation  Visit Diagnosis: Muscle weakness (generalized)  Unsteadiness on feet  Other abnormalities of gait and mobility  Spastic hemiplegia affecting nondominant side (HCC)     Problem List Patient Active Problem List   Diagnosis Date Noted   Pain due to onychomycosis of toenails of both feet 06/10/2021   Neuropathic pain 04/09/2020   Pain    Spastic hemiparesis (HCC)    History of  hypertension    Dyslipidemia    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

## 2021-12-30 NOTE — Therapy (Signed)
[facility], [place], [address] ?Phone: [phone]   Fax:  [phone] ? ?Occupational Therapy Treatment ? ?Patient Details  ?Name: Derek [name] ?MRN: [id number] ?Date of Birth: [birth year] ?Referring Provider (OT): [name] ? ? ?Encounter Date: 12/30/2021 ? ? OT End of Session - 12/30/21 1557   ? ? Visit Number 6   ? Number of Visits 9   ? Date for OT Re-Evaluation 01/15/22   ? Authorization Type BCBS, PT/OT combined = 75 visits   ? Progress Note Due on Visit 10   ? OT Start Time [id number]   ? OT Stop Time 1530   ? OT Time Calculation (min) 45 min   ? Activity Tolerance Patient tolerated treatment well   ? Behavior During Therapy [facility] for tasks assessed/performed   ? ?  ?  ? ?  ? ? ?Past Medical History:  ?Diagnosis Date  ? Acute ischemic right MCA stroke ([place]) 01/07/2020  ? Alcohol dependence ([place])   ? Back pain   ? Marijuana dependence ([place])   ? Tobacco dependence   ? ? ?Past Surgical History:  ?Procedure Laterality Date  ? BUBBLE STUDY  01/10/2020  ? Procedure: BUBBLE STUDY;  Surgeon: Geralynn Rile, MD;  Location: [place];  Service: Cardiovascular;;  ? TEE WITHOUT CARDIOVERSION N/A 01/10/2020  ? Procedure: TRANSESOPHAGEAL ECHOCARDIOGRAM (TEE);  Surgeon: Geralynn Rile, MD;  Location: [facility];  Service: Cardiovascular;  Laterality: N/A;  ? ? ?There were no vitals filed for this visit. ? ? Subjective Assessment - 12/30/21 1525   ? ? Subjective  I thought I would do more machines   ? Currently in Pain? Yes   ? Pain Score 4    ? Pain Location Shoulder   ? Pain Orientation Left   ? Pain Descriptors / Indicators Tightness   ? Pain Type Chronic pain   ? Pain Onset More than a month ago   ? Pain Frequency Intermittent   ? Aggravating Factors  unsure   ? Pain Relieving Factors movement   ? ?  ?  ? ?  ? ? ? ? ? ? ? ? ? ? ? ? ? ? ? OT Treatments/Exercises (OP) - 12/30/21 1530   ? ?  ? Neurological Re-education Exercises  ?  Reciprocal Movements Provided stretch to left arm - shoulder and elbow in supine.  Worked on isolated elbow flexion/extension in supine.  Followed with UBE x 10 revolutions forward and backward with active hand on LUE, then without active hand support - patient able to maintain grasp on handle.  Patient able to use BUE on NuStep without UE strapping.  Patent reports slight decrease in tension in left shoulder after exercise.   ? ?  ?  ? ?  ? ? ? ? ? ? ? ? ? ? ? OT Short Term Goals - 12/30/21 1600   ? ?  ? OT SHORT TERM GOAL #1  ? Title Patient will complete a self stretching program for left upper extremity   ? Baseline XX   ? Time 4   ? Period Weeks   ? Status On-going   reports going well at home - continue to review  ? Target Date 01/15/22   ?  ? OT SHORT TERM GOAL #2  ? Title Patient will don / doff front opening shirt or jacket excluding fasteners with increased time and mod assist   ? Baseline max-total   ? Time 4   ?  Period Weeks   ? Status On-going   completes with min A today but reports aid does it at home 12/25/21  ?  ? OT SHORT TERM GOAL #3  ? Title Patient will report pain of less than 4/10 with forward low reaching tasks   ? Baseline Pain 3 at rest, ranges up to 6-7 with passive movement   ? Time 4   ? Period Weeks   ? Status On-going   little active reaching with LUE  ?  ? OT SHORT TERM GOAL #4  ? Title Pt to bathe self with mod assist seated   ? Baseline xx   ? Period Weeks   ? Status On-going   ?  ? OT SHORT TERM GOAL #5  ? Title Patient to don/doff splint with mod assist   ? Baseline xx   ? Time 4   ? Period Weeks   ? Status On-going   min A today 12/25/21  ? ?  ?  ? ?  ? ? ? ? OT Long Term Goals - 12/01/21 1617   ? ?  ? OT LONG TERM GOAL #1  ? Title xxx   ? Baseline xxx   ?  ? OT LONG TERM GOAL #2  ? Title xxx   ? Baseline xxx   ?  ? OT LONG TERM GOAL #3  ? Title xxx   ? Baseline xxx   ?  ? OT LONG TERM GOAL #4  ? Title xxx   ? Baseline xxx   ? ?  ?  ? ?  ? ? ? ? ? ? ? ? Plan - 12/30/21 1558   ? ?  Clinical Impression Statement Patient reports helping about 30% of simple dressing tasks.  Encouraged continuing to work towards increased independence.   ? OT Occupational Profile and History Detailed Assessment- Review of Records and additional review of physical, cognitive, psychosocial history related to current functional performance   ? Occupational performance deficits (Please refer to evaluation for details): ADL's;Leisure;Social Participation   ? Body Structure / Function / Physical Skills ADL;Decreased knowledge of use of DME;Obesity;Strength;Tone;Pain;GMC;Dexterity;Balance;Body mechanics;Edema;Proprioception;UE functional use;Vestibular;ROM;IADL;Endurance;Vision;Scar mobility;Coordination;Flexibility;Mobility;Sensation;Muscle spasms;FMC;Decreased knowledge of precautions   ? Cognitive Skills Attention;Emotional;Energy/Drive;Problem Solve;Safety Awareness;Sequencing;Thought   ? Rehab Potential Fair   ? Clinical Decision Making Several treatment options, min-mod task modification necessary   ? Comorbidities Affecting Occupational Performance: May have comorbidities impacting occupational performance   ? Modification or Assistance to Complete Evaluation  No modification of tasks or assist necessary to complete eval   ? OT Frequency 2x / week   ? OT Duration 4 weeks   ? OT Treatment/Interventions Self-care/ADL training;Moist Heat;Fluidtherapy;DME and/or AE instruction;Splinting;Balance training;Therapeutic activities;Cognitive remediation/compensation;Therapeutic exercise;Ultrasound;Neuromuscular education;Functional Mobility Training;Passive range of motion;Visual/perceptual remediation/compensation;Patient/family education;Manual Therapy;Electrical Stimulation   ? Plan encourage more participation in ADLs, review self PROM and review ADL strategies   ? Consulted and Agree with Plan of Care Patient   ? ?  ?  ? ?  ? ? ?Patient will benefit from skilled therapeutic intervention in order to improve the  following deficits and impairments:   ?Body Structure / Function / Physical Skills: ADL, Decreased knowledge of use of DME, Obesity, Strength, Tone, Pain, GMC, Dexterity, Balance, Body mechanics, Edema, Proprioception, UE functional use, Vestibular, ROM, IADL, Endurance, Vision, Scar mobility, Coordination, Flexibility, Mobility, Sensation, Muscle spasms, FMC, Decreased knowledge of precautions ?Cognitive Skills: Attention, Emotional, Energy/Drive, Problem Solve, Safety Awareness, Sequencing, Thought ?  ? ? ?Visit Diagnosis: ?Spastic hemiplegia affecting nondominant side ([place]) ? ?  Muscle weakness (generalized) ? ?Abnormal posture ? ?Other disturbances of skin sensation ? ?Attention and concentration deficit ? ?Pain in left arm ? ?Unsteadiness on feet ? ?Muscle weakness ? ? ? ?Problem List ?Patient Active Problem List  ? Diagnosis Date Noted  ? Pain due to onychomycosis of toenails of both feet 06/10/2021  ? Neuropathic pain 04/09/2020  ? Pain   ? Spastic hemiparesis ([place])   ? History of hypertension   ? Dyslipidemia   ? 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 Leukocytosis 01/12/2020  ? Cerebrovascular accident (CVA) due to thrombosis of right middle cerebral artery ([place])   ? Chronic pain syndrome   ? Hypokalemia   ? Dysphagia, post-stroke   ? Acute ischemic right MCA stroke ([place]) 01/07/2020  ? Tobacco dependence   ? Marijuana dependence ([place])   ? Alcohol dependence ([place])   ? ? ?Mariah Milling, OT ?12/30/2021, 4:02 PM ? ?[facility] ?[place], [place], [address] ?Phone: [phone]   Fax:  [phone] ? ?Name: Derek [name] ?MRN: [id number] ?Date of Birth: [birth year] ? ?

## 2022-01-01 ENCOUNTER — Ambulatory Visit: Payer: BC Managed Care – PPO | Admitting: Occupational Therapy

## 2022-01-01 ENCOUNTER — Encounter: Payer: Self-pay | Admitting: Occupational Therapy

## 2022-01-01 ENCOUNTER — Ambulatory Visit: Payer: BC Managed Care – PPO | Admitting: Physical Therapy

## 2022-01-01 ENCOUNTER — Encounter: Payer: Self-pay | Admitting: Physical Therapy

## 2022-01-01 ENCOUNTER — Other Ambulatory Visit: Payer: Self-pay

## 2022-01-01 DIAGNOSIS — M6281 Muscle weakness (generalized): Secondary | ICD-10-CM

## 2022-01-01 DIAGNOSIS — M79602 Pain in left arm: Secondary | ICD-10-CM | POA: Diagnosis not present

## 2022-01-01 DIAGNOSIS — R41840 Attention and concentration deficit: Secondary | ICD-10-CM | POA: Diagnosis not present

## 2022-01-01 DIAGNOSIS — R2681 Unsteadiness on feet: Secondary | ICD-10-CM

## 2022-01-01 DIAGNOSIS — R2689 Other abnormalities of gait and mobility: Secondary | ICD-10-CM | POA: Diagnosis not present

## 2022-01-01 DIAGNOSIS — R293 Abnormal posture: Secondary | ICD-10-CM

## 2022-01-01 DIAGNOSIS — G8110 Spastic hemiplegia affecting unspecified side: Secondary | ICD-10-CM

## 2022-01-01 DIAGNOSIS — R208 Other disturbances of skin sensation: Secondary | ICD-10-CM | POA: Diagnosis not present

## 2022-01-01 NOTE — Therapy (Addendum)
[facility] [address] [place], [place], [address] Phone: [phone]   Fax:  [phone]  Physical Therapy Treatment/Discharge Summary  Patient Details  Name: Derek [name] MRN: [id number] Date of Birth: [birth year] Referring Provider (PT): Alger Simons, MD  PHYSICAL THERAPY DISCHARGE SUMMARY  Visits from Start of Care: 8  Current functional level related to goals / functional outcomes: See Clinical Impression   Remaining deficits: Abnormal Gait, Increased Tone   Education / Equipment: HEP/Walking Program   Patient agrees to discharge. Patient goals were partially met. Patient is being discharged due to maximized rehab potential.   Encounter Date: 01/01/2022   PT End of Session - 01/01/22 1318     Visit Number 8    Number of Visits 9   4 week POC   Date for PT Re-Evaluation 01/01/22    Authorization Type BCBS (VL PT/OT: 10)    PT Start Time 1316    PT Stop Time 1356    PT Time Calculation (min) 40 min    Activity Tolerance Patient tolerated treatment well    Behavior During Therapy [facility] for tasks assessed/performed             Past Medical History:  Diagnosis Date   Acute ischemic right MCA stroke ([place]) 01/07/2020   Alcohol dependence ([place])    Back pain    Marijuana dependence ([place])    Tobacco dependence     Past Surgical History:  Procedure Laterality Date   BUBBLE STUDY  01/10/2020   Procedure: BUBBLE STUDY;  Surgeon: Geralynn Rile, MD;  Location: [facility];  Service: Cardiovascular;;   TEE WITHOUT CARDIOVERSION N/A 01/10/2020   Procedure: TRANSESOPHAGEAL ECHOCARDIOGRAM (TEE);  Surgeon: Geralynn Rile, MD;  Location: [facility];  Service: Cardiovascular;  Laterality: N/A;    There were no vitals filed for this visit.   Subjective Assessment - 01/01/22 1317     Subjective No new complaints.  No falls or pain to report. Shoulder was hurting, is better after working  with OT before PT session.    Pertinent History R MCA CVA, Alcohol Dependence, Marijuana/Tobacco Dependence, Back Pain    Limitations Standing;House hold activities;Walking    Patient Stated Goals Improve balance without device; reduce stiffness in the leg    Currently in Pain? No/denies    Pain Score 0-No pain                  [facility] Adult PT Treatment/Exercise - 01/01/22 1318       Transfers   Transfers Sit to Stand;Stand to Sit    Sit to Stand 5: Supervision    Stand to Sit 5: Supervision      Ambulation/Gait   Ambulation/Gait Yes    Ambulation/Gait Assistance 5: Supervision    Ambulation Distance (Feet) --   around clinic with session   Assistive device Large base quad cane    Gait Pattern Step-through pattern;Decreased arm swing - left;Left flexed knee in stance;Decreased weight shift to left;Decreased stance time - left;Decreased step length - right;Decreased step length - left;Lateral trunk lean to right;Poor foot clearance - left    Ambulation Surface Level;Indoor      Self-Care   Self-Care Other Self-Care Comments    Other Self-Care Comments  provided with ordering information for rubber tip for cane from [employer] and [employer]. Also what to search for if he wants to check other places.      Exercises   Exercises Other Exercises  Other Exercises  reviewed with pt performing ex's from HEP. Cues needed for correct form and hold times with some. Pt has not been consistently performing all of them at home. Encouraged pt to add those he was not doing back into his routine.            issued to HEP this session:  WALKING  Walking is a great form of exercise to increase your strength, endurance and overall fitness.  A walking program can help you start slowly and gradually build endurance as you go.  Everyone's ability is different, so each person's starting point will be different.  You do not have to follow them exactly.  The are just samples. You should simply find  out what's right for you and stick to that program.   In the beginning, you'll start off walking 2-3 times a day for short distances.  As you get stronger, you'll be walking further at just 1-2 times per day.  A. You Can Walk For A Certain Length Of Time Each Day    Walk 5 minutes 1-2 times per day.  Increase 1-2 minutes every 7-10 days (1-2 times per day).  Work up to 25-30 minutes (1-2 times per day).     Please only do the exercises that your therapist has initialed and dated   Access Code: [email or web address] URL: [email or web address] Date: 01/01/2022 Prepared by: Willow Ora  Exercises Supine Bridge - 2 x daily - 5 x weekly - 2 sets - 8 reps Bent Knee Fallouts - 2 x daily - 5 x weekly - 2 sets - 8 reps Supine March with Resistance Band - 1 x daily - 7 x weekly - 3 sets - 10 reps Supine Heel Slide - 2 x daily - 5 x weekly - 2 sets - 8 reps Seated Heel Slide - 1 x daily - 5 x weekly - 2 sets - 8 reps Sit to Stand with Armchair - 1 x daily - 5 x weekly - 3 sets - 5 reps Seated Hamstring Stretch - 3 x daily - 7 x weekly - 1 sets - 3 reps - 30 sec hold Seated Gastroc Stretch with Strap - 3 x daily - 7 x weekly - 1 sets - 3 reps - 30 sec hold Side to Side Weight Shift with Counter Support - 2 x daily - 7 x weekly - 2 sets - 10 reps Hip flexion/extension with resistance - 3 x daily - 7 x weekly - 1 sets - 4 reps - 30 sec hold Supine Hamstring Stretch with Caregiver - 2 x daily - 5 x weekly - 1 sets - 3 reps - 30 sec hold Supine Ankle Dorsiflexion Stretch with Caregiver - 2 x daily - 5 x weekly - 1 sets - 3 reps - 30 sec hold Single Knee to Chest with Caregiver - 2 x daily - 5 x weekly - 1 sets - 3 reps - 30 seconds hold        PT Education - 01/01/22 1349     Education Details to continue with HEP; walking program; wear to look for rubber quad tip for cane    Person(s) Educated Patient    Methods Explanation;Demonstration;Verbal cues;Handout    Comprehension Verbalized  understanding;Returned demonstration              PT Short Term Goals - 12/01/21 1616       PT SHORT TERM GOAL #1   Title = LTGs  PT Long Term Goals - 01/01/22 1354       PT LONG TERM GOAL #1   Title Pt and caregiver will report independence with HEP for strengthening/balance/stretching (ALL LTGs Due: 01/01/22)    Baseline 01/01/22: met with current program and issued walking program.    Status Achieved      PT LONG TERM GOAL #2   Title Pt will improve 5x sit <> stand to </= 18 secs with single UE support to demo improved balance    Baseline 21.09 secs; 19.88 secs    Status Not Met      PT LONG TERM GOAL #3   Title Pt will improve gait speed to >/= 1.5 ft/sec with LRAD to demo improved community mobility    Baseline 1.18 ft/sec 1.42 ft/sec    Status Not Met      PT LONG TERM GOAL #4   Title Pt will improve TUG to </= 25 seconds to demo improved balance and reudced fall risk    Baseline 29.5 secs with LBQC; 26.58 secs with LBQC    Status Not Met      PT LONG TERM GOAL #5   Title Pt will be able to ambulate >/= 500 ft outdoors w/ LRAD and ascend/descend curb iwth supervision to improve community mobility    Baseline TBA; 600 ft with supervision outdoors [facility]. able to ascend/descend curb with AD supervision    Status Achieved                   Plan - 01/01/22 1318     Clinical Impression Statement Check remaining goal for HEP this session with new copy of ex program issued. Also issued a walking program for pt to start. He reports he used to walk to mailbox and back, has not been doing that lately. Discussed this as a goal for him to work towards. Lastly provided pt with information on places to get rubber quad tip for straight cane to work with this at home.    Personal Factors and Comorbidities Comorbidity 2    Comorbidities R MCA CVA, Alcohol Dependence, Marijuana/Tobacco Dependence, Back Pain    Examination-Activity Limitations  Transfers;Stairs;Stand;Locomotion Level    Stability/Clinical Decision Making Stable/Uncomplicated    Rehab Potential Fair    PT Frequency 2x / week    PT Duration 4 weeks    PT Treatment/Interventions ADLs/Self Care Home Management;Moist Heat;Cryotherapy;DME Instruction;Therapeutic activities;Functional mobility training;Stair training;Gait training;Patient/family education;Balance training;Therapeutic exercise;Orthotic Fit/Training;Neuromuscular re-education;Manual techniques;Dry needling;Passive range of motion    PT Next Visit Plan Discharge per PT POC    PT Home Exercise Plan Access Code: [email or web address]    Consulted and Agree with Plan of Care Patient             Patient will benefit from skilled therapeutic intervention in order to improve the following deficits and impairments:  Abnormal gait, Decreased balance, Decreased endurance, Decreased mobility, Difficulty walking, Decreased range of motion, Impaired tone, Pain, Postural dysfunction, Decreased strength, Impaired flexibility, Decreased activity tolerance, Decreased coordination, Impaired sensation  Visit Diagnosis: Muscle weakness (generalized)  Unsteadiness on feet  Other abnormalities of gait and mobility     Problem List Patient Active Problem List   Diagnosis Date Noted   Pain due to onychomycosis of toenails of both feet 06/10/2021   Neuropathic pain 04/09/2020   Pain    Spastic hemiparesis (HCC)    History of hypertension    Dyslipidemia    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  Leukocytosis 01/12/2020   Cerebrovascular accident (CVA) due to thrombosis of right middle cerebral artery (HCC)    Chronic pain syndrome    Hypokalemia    Dysphagia, post-stroke    Acute ischemic right MCA stroke ([place]) 01/07/2020   Tobacco dependence    Marijuana dependence ([place])    Alcohol dependence ([place])    Addendum: Jones Bales, PT, DPT  Willow Ora, PTA, [facility] [address] [place], [place] [address] [phone] 01/01/22, 2:01 PM   Name: Derek [name] MRN: [id number] Date of Birth: Jul 28, 1976

## 2022-01-01 NOTE — Patient Instructions (Signed)
WALKING ? ?Walking is a great form of exercise to increase your strength, endurance and overall fitness.  A walking program can help you start slowly and gradually build endurance as you go.  Everyone's ability is different, so each person's starting point will be different.  You do not have to follow them exactly.  The are just samples. You should simply find out what's right for you and stick to that program.   ?In the beginning, you'll start off walking 2-3 times a day for short distances.  As you get stronger, you'll be walking further at just 1-2 times per day. ? ?A. You Can Walk For A Certain Length Of Time Each Day ?  ? Walk 5 minutes 1-2 times per day. ? Increase 1-2 minutes every 7-10 days (1-2 times per day). ? Work up to 25-30 minutes (1-2 times per day). ? ? ? ? ?Please only do the exercises that your therapist has initialed and dated  ?

## 2022-01-01 NOTE — Therapy (Signed)
[facility] ?[address] ?[place], [place], [address] ?Phone: [phone]   Fax:  [phone] ? ?Occupational Therapy Treatment ? ?Patient Details  ?Name: Derek [name] ?MRN: [id number] ?Date of Birth: 1976-06-02 ?Referring Provider (OT): Riley Kill ? ? ?Encounter Date: 01/01/2022 ? ? OT End of Session - 01/01/22 1238   ? ? Visit Number 7   ? Number of Visits 9   ? Date for OT Re-Evaluation 01/15/22   ? Authorization Type BCBS, PT/OT combined = 75 visits   ? Progress Note Due on Visit 10   ? OT Start Time 1237   arrival time  ? OT Stop Time 1315   ? OT Time Calculation (min) 38 min   ? Activity Tolerance Patient tolerated treatment well   ? Behavior During Therapy [facility] for tasks assessed/performed   ? ?  ?  ? ?  ? ? ?Past Medical History:  ?Diagnosis Date  ? 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/07/2020  ? Alcohol dependence ([facility])   ? Back pain   ? Marijuana dependence ([facility])   ? Tobacco dependence   ? ? ?Past Surgical History:  ?Procedure Laterality Date  ? BUBBLE STUDY  01/10/2020  ? Procedure: BUBBLE STUDY;  Surgeon: Sande Rives, MD;  Location: [facility] ENDOSCOPY;  Service: Cardiovascular;;  ? TEE WITHOUT CARDIOVERSION N/A 01/10/2020  ? Procedure: TRANSESOPHAGEAL ECHOCARDIOGRAM (TEE);  Surgeon: Sande Rives, MD;  Location[facility] ENDOSCOPY;  Service: Cardiovascular;  Laterality: N/A;  ? ? ?There were no vitals filed for this visit. ? ? Subjective Assessment - 01/01/22 1237   ? ? Subjective  "supposed to get windy later"   ? Currently in Pain? Yes   ? Pain Score 3    ? Pain Location Shoulder   ? Pain Orientation Left   ? Pain Descriptors / Indicators Tightness   ? Pain Type Chronic pain   ? Pain Onset More than a month ago   ? Pain Frequency Constant   ? ?  ?  ? ?  ? ? ? ?Weight bearing in LUE for inhibition of tone and facilitation of muscle activation while reaching with RUE to place resistance clothespins 1-8#. Pt required max assistance for  maintaining hand position on mat for weight bearing. Weight bearing into forearm of LUE for inhibition of tone and working on pushing up off of LUE hand for sitting back up to upright seated position. ? ?PROM and AAROM for LUE shoulder, wrist and hand  ? ? ? ? ? ? ? ? ? ? ? ? ? ? ? ? ? ? ? ? ? ? OT Short Term Goals - 12/30/21 1600   ? ?  ? OT SHORT TERM GOAL #1  ? Title Patient will complete a self stretching program for left upper extremity   ? Baseline XX   ? Time 4   ? Period Weeks   ? Status On-going   reports going well at home - continue to review  ? Target Date 01/15/22   ?  ? OT SHORT TERM GOAL #2  ? Title Patient will don / doff front opening shirt or jacket excluding fasteners with increased time and mod assist   ? Baseline max-total   ? Time 4   ? Period Weeks   ? Status On-going   completes with min A today but reports aid does it at home 12/25/21  ?  ? OT SHORT TERM GOAL #3  ? Title Patient will report  pain of less than 4/10 with forward low reaching tasks   ? Baseline Pain 3 at rest, ranges up to 6-7 with passive movement   ? Time 4   ? Period Weeks   ? Status On-going   little active reaching with LUE  ?  ? OT SHORT TERM GOAL #4  ? Title Pt to bathe self with mod assist seated   ? Baseline xx   ? Period Weeks   ? Status On-going   ?  ? OT SHORT TERM GOAL #5  ? Title Patient to don/doff splint with mod assist   ? Baseline xx   ? Time 4   ? Period Weeks   ? Status On-going   min A today 12/25/21  ? ?  ?  ? ?  ? ? ? ? OT Long Term Goals - 12/01/21 1617   ? ?  ? OT LONG TERM GOAL #1  ? Title xxx   ? Baseline xxx   ?  ? OT LONG TERM GOAL #2  ? Title xxx   ? Baseline xxx   ?  ? OT LONG TERM GOAL #3  ? Title xxx   ? Baseline xxx   ?  ? OT LONG TERM GOAL #4  ? Title xxx   ? Baseline xxx   ? ?  ?  ? ?  ? ? ? ? ? ? ? ? Plan - 01/01/22 1318   ? ? Clinical Impression Statement Encouraged patient to continue working with caregiver/aid with HEP for stretching. Pt continues to wear splint (custom resting hand splint  during day and prefab at night).   ? OT Occupational Profile and History Detailed Assessment- Review of Records and additional review of physical, cognitive, psychosocial history related to current functional performance   ? Occupational performance deficits (Please refer to evaluation for details): ADL's;Leisure;Social Participation   ? Body Structure / Function / Physical Skills ADL;Decreased knowledge of use of DME;Obesity;Strength;Tone;Pain;GMC;Dexterity;Balance;Body mechanics;Edema;Proprioception;UE functional use;Vestibular;ROM;IADL;Endurance;Vision;Scar mobility;Coordination;Flexibility;Mobility;Sensation;Muscle spasms;FMC;Decreased knowledge of precautions   ? Cognitive Skills Attention;Emotional;Energy/Drive;Problem Solve;Safety Awareness;Sequencing;Thought   ? Rehab Potential Fair   ? Clinical Decision Making Several treatment options, min-mod task modification necessary   ? Comorbidities Affecting Occupational Performance: May have comorbidities impacting occupational performance   ? Modification or Assistance to Complete Evaluation  No modification of tasks or assist necessary to complete eval   ? OT Frequency 2x / week   ? OT Duration 4 weeks   ? OT Treatment/Interventions Self-care/ADL training;Moist Heat;Fluidtherapy;DME and/or AE instruction;Splinting;Balance training;Therapeutic activities;Cognitive remediation/compensation;Therapeutic exercise;Ultrasound;Neuromuscular education;Functional Mobility Training;Passive range of motion;Visual/perceptual remediation/compensation;Patient/family education;Manual Therapy;Electrical Stimulation   ? Plan encourage more participation in ADLs, review self PROM and review ADL strategies   ? Consulted and Agree with Plan of Care Patient   ? ?  ?  ? ?  ? ? ?Patient will benefit from skilled therapeutic intervention in order to improve the following deficits and impairments:   ?Body Structure / Function / Physical Skills: ADL, Decreased knowledge of use of DME,  Obesity, Strength, Tone, Pain, GMC, Dexterity, Balance, Body mechanics, Edema, Proprioception, UE functional use, Vestibular, ROM, IADL, Endurance, Vision, Scar mobility, Coordination, Flexibility, Mobility, Sensation, Muscle spasms, FMC, Decreased knowledge of precautions ?Cognitive Skills: Attention, Emotional, Energy/Drive, Problem Solve, Safety Awareness, Sequencing, Thought ?  ? ? ?Visit Diagnosis: ?Muscle weakness (generalized) ? ?Unsteadiness on feet ? ?Other abnormalities of gait and mobility ? ?Spastic hemiplegia affecting nondominant side (HCC) ? ?Abnormal posture ? ?Other disturbances of skin sensation ? ?Attention and concentration  deficit ? ? ? ?Problem List ?Patient Active Problem List  ? Diagnosis Date Noted  ? Pain due to onychomycosis of toenails of both feet 06/10/2021  ? Neuropathic pain 04/09/2020  ? Pain   ? Spastic hemiparesis (HCC)   ? History of hypertension   ? Dyslipidemia   ? 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  ? Leukocytosis 01/12/2020  ? Cerebrovascular accident (CVA) due to thrombosis of right middle cerebral artery (HCC)   ? Chronic pain syndrome   ? Hypokalemia   ? Dysphagia, post-stroke   ? 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/07/2020  ? Tobacco dependence   ? Marijuana dependence ([facility])   ? Alcohol dependence ([facility])   ? ? ?Junious Dresser, OT ?01/01/2022, 1:20 PM ? ?[facility] ?[facility] ?[address] ?[place], [place], [address] ?Phone: [phone]   Fax:  [phone] ? ?Name: SOPHIA CUBERO ?MRN: [id number] ?Date of Birth: 11-Sep-1976 ? ?

## 2022-01-02 IMAGING — MR MR MRA HEAD W/O CM
1 series · 19 of 48 positions shown · non-contrast
Comparison: CT head 01/07/2020

CLINICAL DATA: Stroke. Last seen normal 1811 hours on [REDACTED].
Left-sided deficits.

EXAM:
MRI HEAD WITHOUT CONTRAST
MRA HEAD WITHOUT CONTRAST
TECHNIQUE: Multiplanar, multiecho pulse sequences of the brain and surrounding
structures were obtained without intravenous contrast. Angiographic
images of the head were obtained using MRA technique without
contrast.

[Series 4: (id) mt fs · axial · 1.4mm · 0.43mm/px · z∈[-12,+74]mm · 19 of 136 slices shown]
[im 1/136]
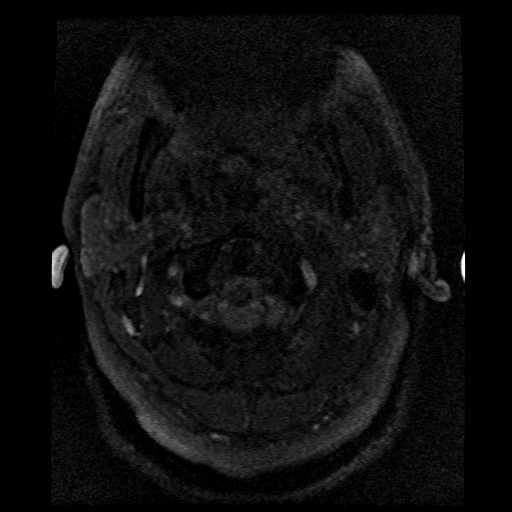
[im 3/136]
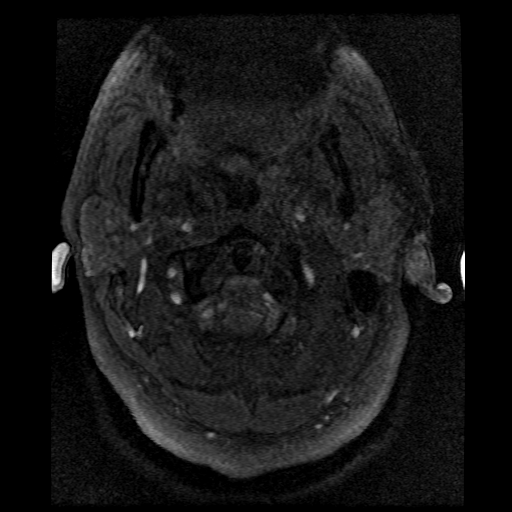
[im 6/136]
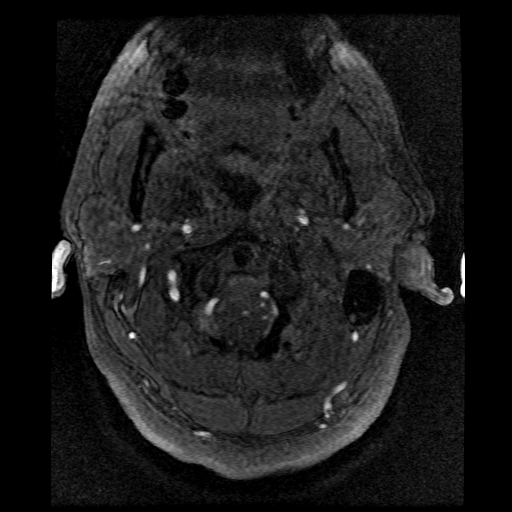
[im 9/136]
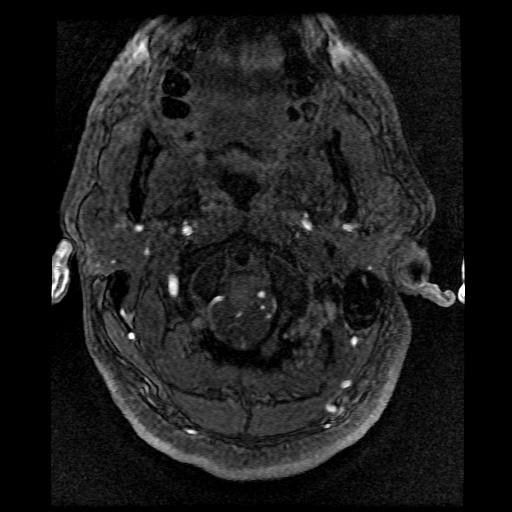
[im 12/136]
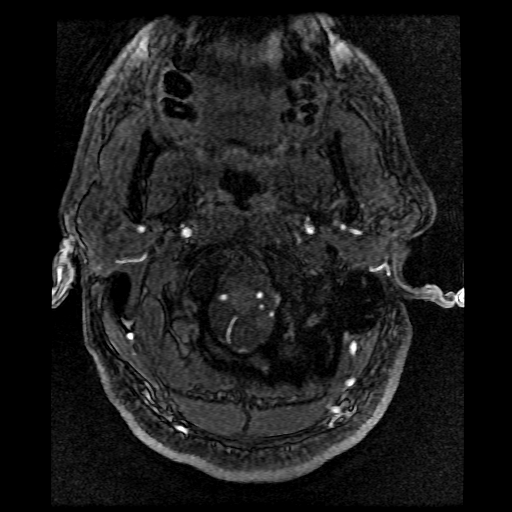
[im 15/136]
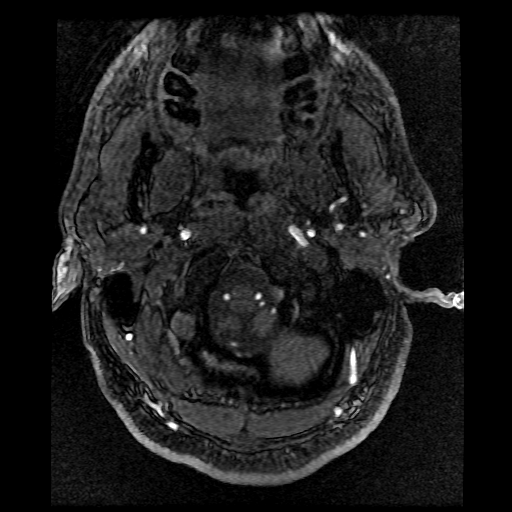
[im 18/136]
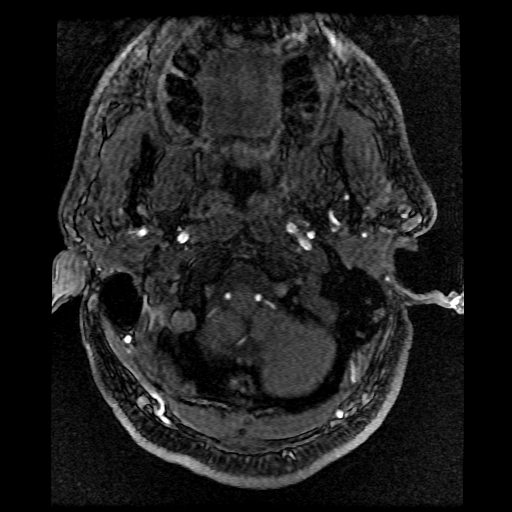
[im 21/136]
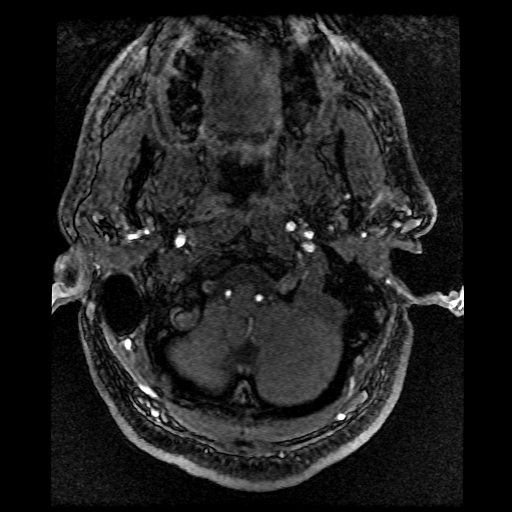
[im 23/136]
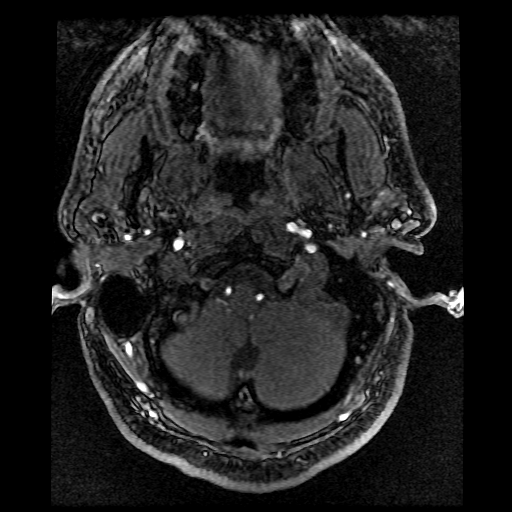
[im 26/136]
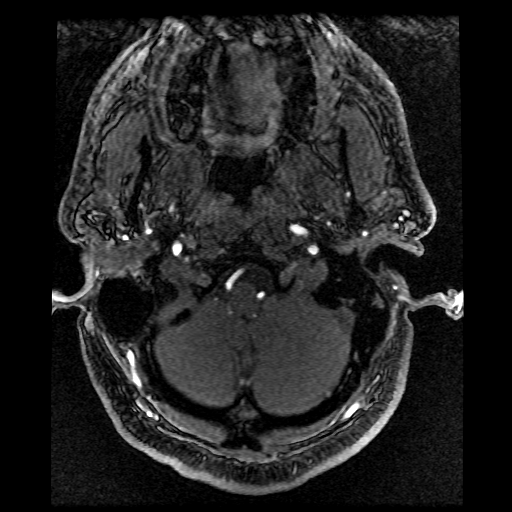
[im 29/136]
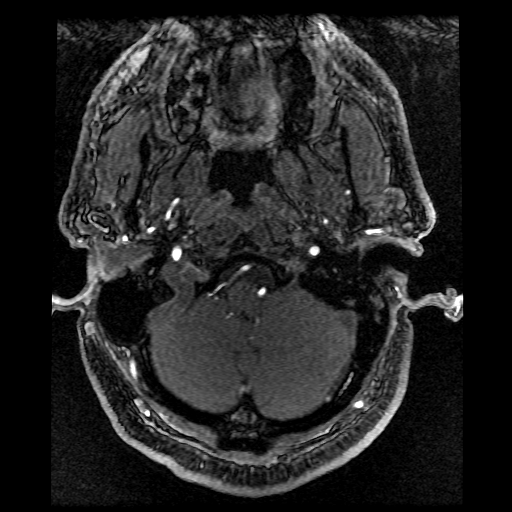
[im 44/136]
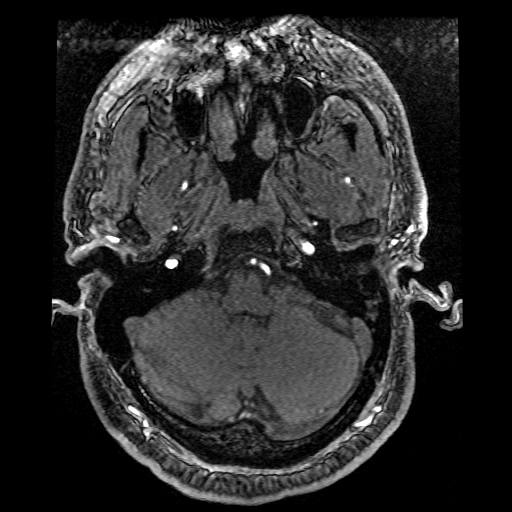
[im 61/136]
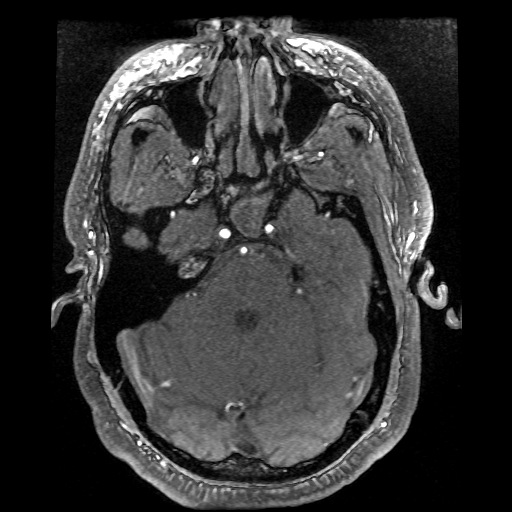
[im 69/136]
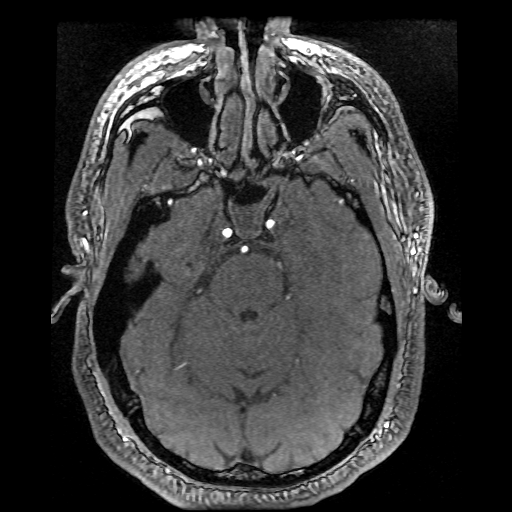
[im 78/136]
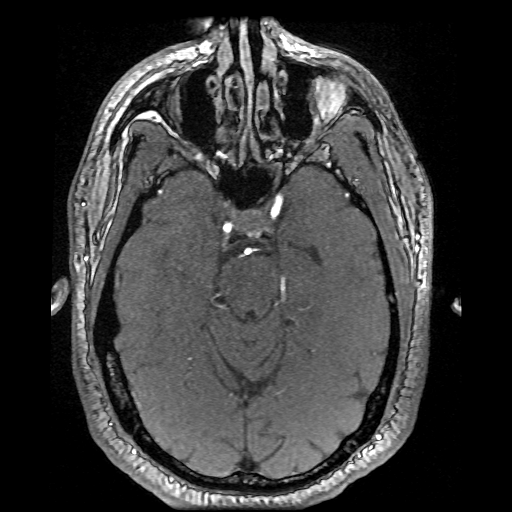
[im 95/136]
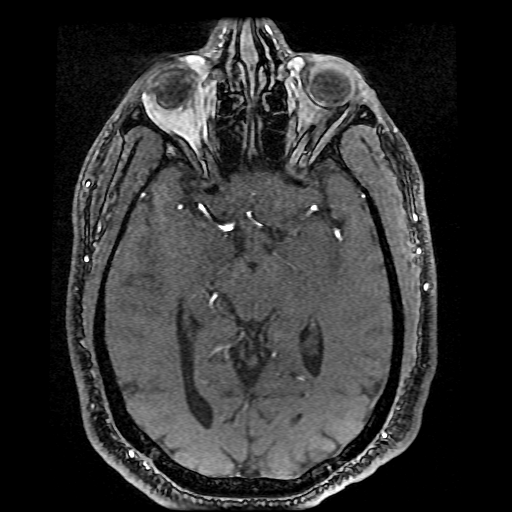
[im 113/136]
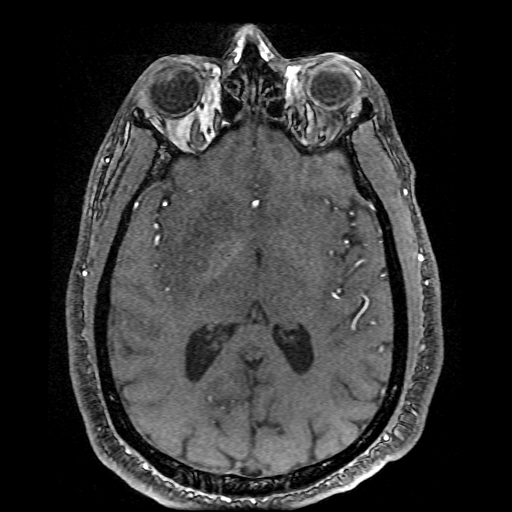
[im 115/136]
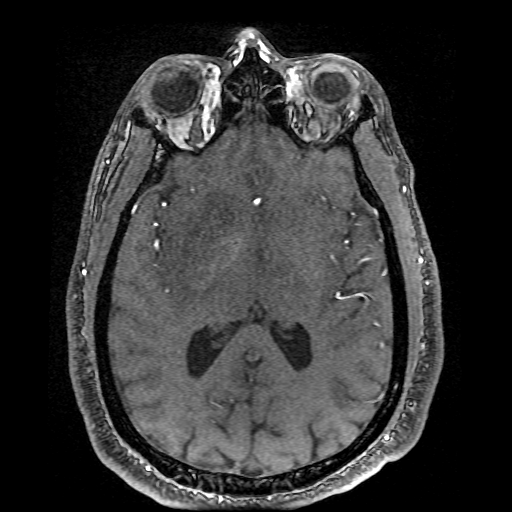
[im 130/136]
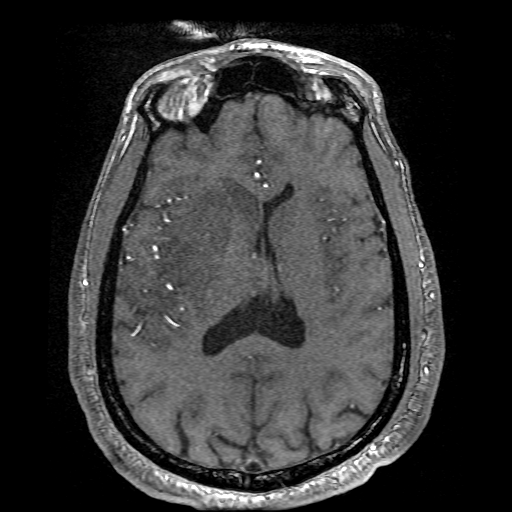

[19 of 48 positions shown; findings below may reference images not displayed]

FINDINGS: MRI HEAD FINDINGS

Brain: Acute infarct right posterior MCA territory involving the
right parietal cortex extending over the medial convexity. Patchy
acute infarct also in the right basal ganglia with moderate amount
of associated hemorrhage. This hemorrhage is mildly hyperdense on CT
most likely hemorrhagic transformation of basal ganglia infarct on
the right. There is local mass-effect on the right lateral ventricle
with minimal midline shift to the left. No hydrocephalus. No mass
lesion.

Vascular: Normal arterial flow voids.

Skull and upper cervical spine: No focal skull lesion.

Sinuses/Orbits: Mucosal edema paranasal sinuses with air-fluid level
in the sphenoid sinus. Negative orbit

Other: None

MRA HEAD FINDINGS

Both vertebral arteries widely patent to the basilar. PICA patent
bilaterally. Basilar widely patent. Superior cerebellar and
posterior cerebral arteries widely patent bilaterally.

Left internal carotid artery widely patent. Left anterior and middle
cerebral arteries widely patent. Azygos A2 segment.

Right internal carotid artery widely patent. Right A1 segment
patent. Common A2 segment.

Right M1 segment patent. Moderate stenosis in the anterior division
of the right M2 branch. No large vessel occlusion.
IMPRESSION: Acute right MCA territory involving basal ganglia and right parietal
lobe. There is hemorrhagic transformation of the right basal ganglia
infarct with mass-effect on the lateral ventricle.

Right M1 segment widely patent. Moderate stenosis right M2 segment
anteriorly. Presumably the thrombus has recanalized given the
symptoms are greater than 48 hours old.

## 2022-01-02 IMAGING — MR MR HEAD W/O CM
7 of 9 series · 38 of 48 positions shown · non-contrast
Comparison: CT head 01/07/2020

CLINICAL DATA: Stroke. Last seen normal 1811 hours on [REDACTED].
Left-sided deficits.

EXAM:
MRI HEAD WITHOUT CONTRAST
MRA HEAD WITHOUT CONTRAST
TECHNIQUE: Multiplanar, multiecho pulse sequences of the brain and surrounding
structures were obtained without intravenous contrast. Angiographic
images of the head were obtained using MRA technique without
contrast.

[Series 3: DWI · axial · 3.0mm · 1.09mm/px · z∈[+3,+149]mm · 9 of 102 slices shown (1 of 4)]
[im 1/102]
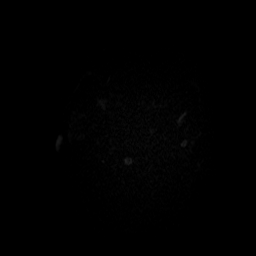
[im 17/102]
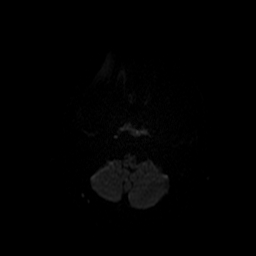
[im 34/102]
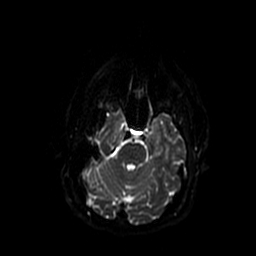
[im 43/102]
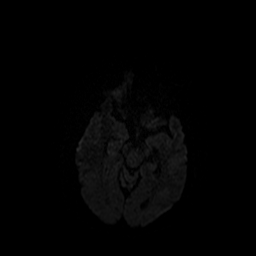
[im 51/102]
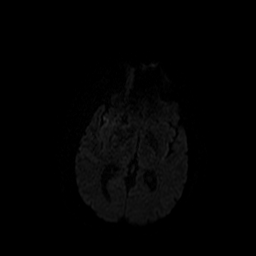
[im 59/102]
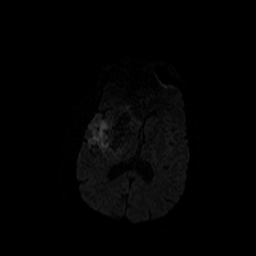
[im 68/102]
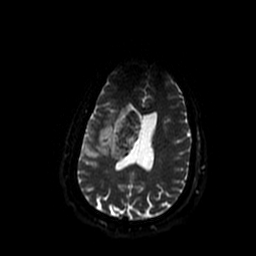
[im 85/102]
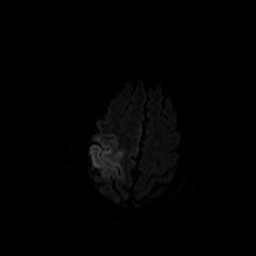
[im 102/102]
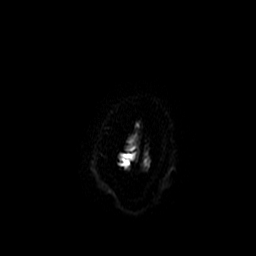

[Series 5: DWI · coronal · 5.0mm · 1.09mm/px · 9 of 72 slices shown (2 of 4)]
[im 1/72]
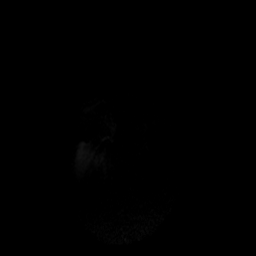
[im 9/72]
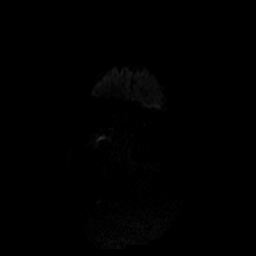
[im 18/72]
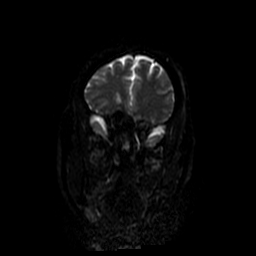
[im 27/72]
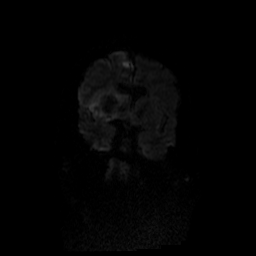
[im 36/72]
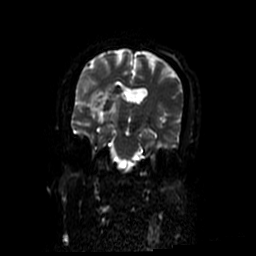
[im 45/72]
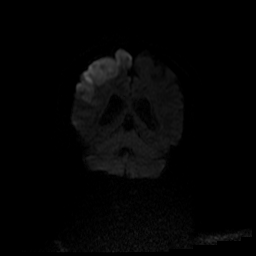
[im 54/72]
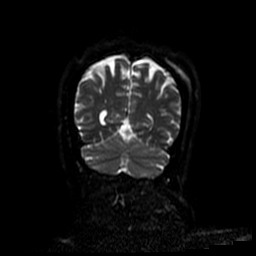
[im 63/72]
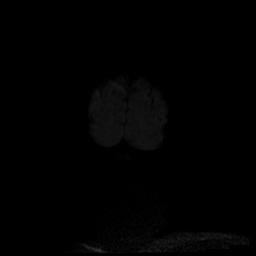
[im 72/72]
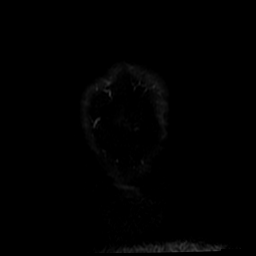

[Series 7: T2 · axial · 5.0mm · 0.43mm/px · z∈[-1,+147]mm · 3 of 27 slices shown (1 of 2)]
[im 1/27]
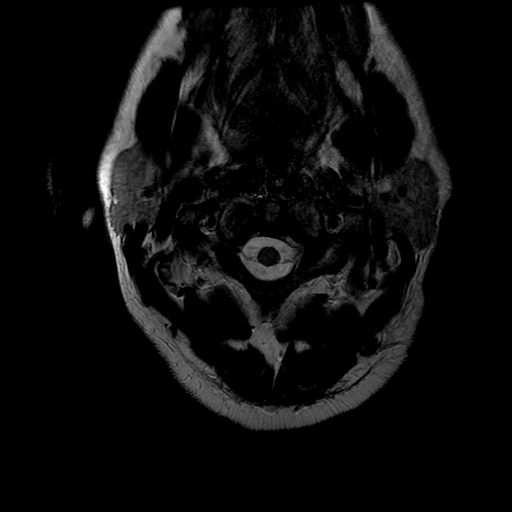
[im 14/27]
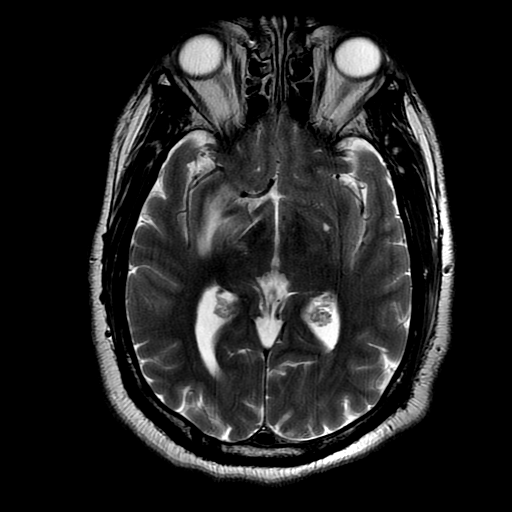
[im 27/27]
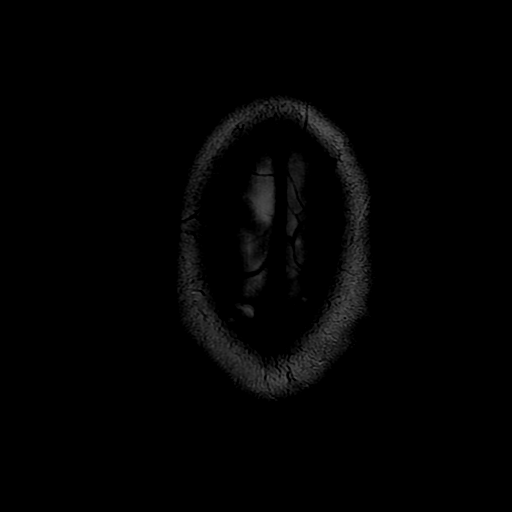

[Series 8: FLAIR · axial · 3.0mm · 0.43mm/px · z∈[-1,+158]mm · 3 of 28 slices shown]
[im 1/28]
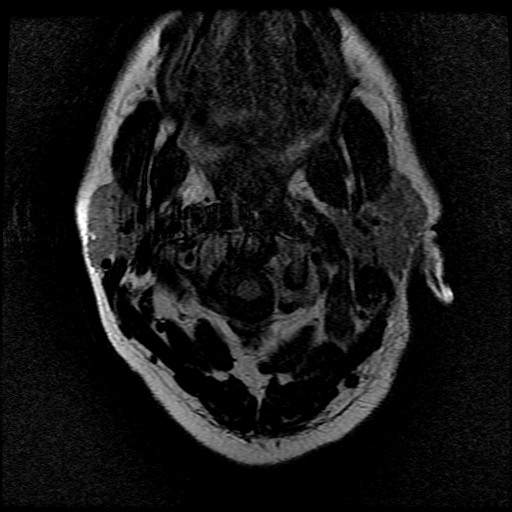
[im 14/28]
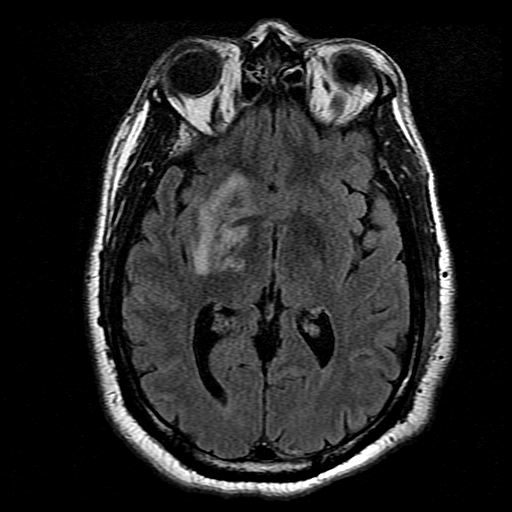
[im 28/28]
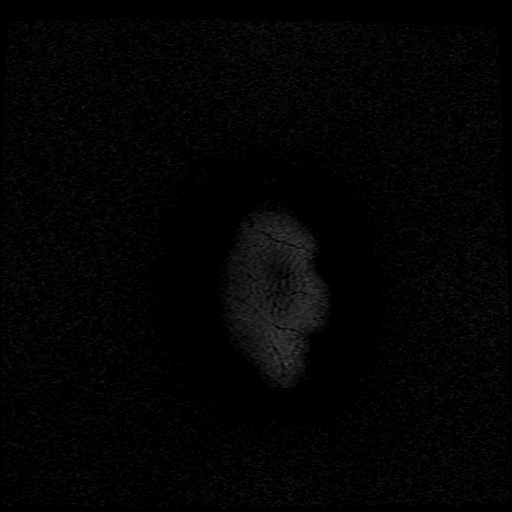

[Series 11: T2 · coronal · 5.0mm · 0.39mm/px · 4 of 29 slices shown (2 of 2)]
[im 1/29]
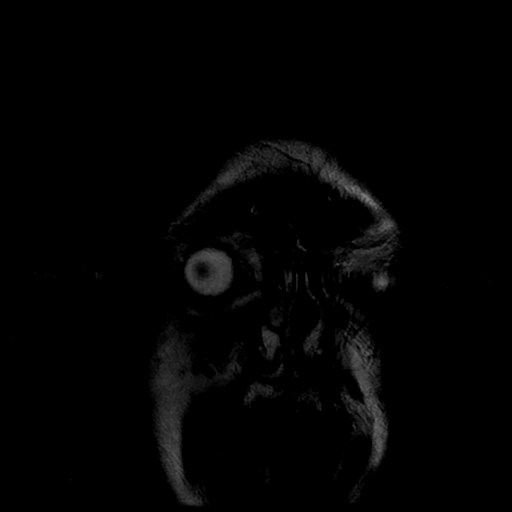
[im 10/29]
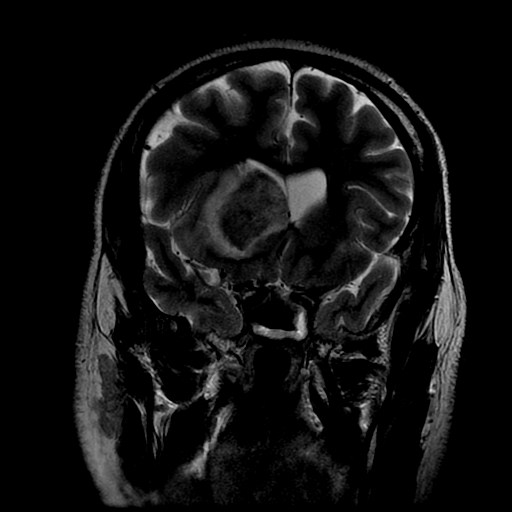
[im 19/29]
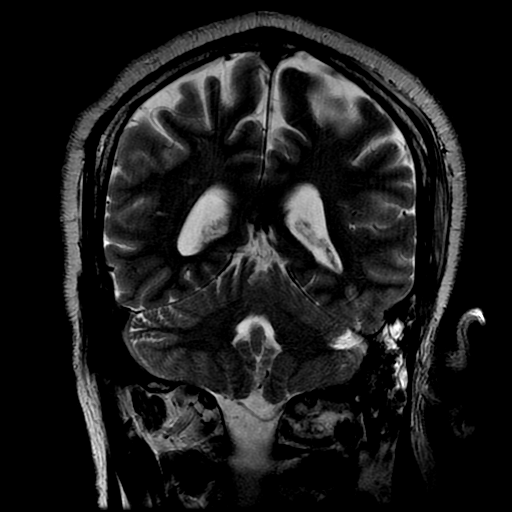
[im 29/29]
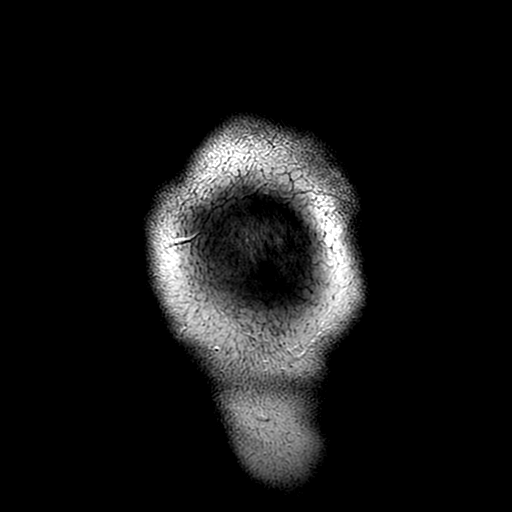

[Series 300: DWI · axial · 3.0mm · 1.09mm/px · z∈[+3,+149]mm · 6 of 51 slices shown (3 of 4)]
[im 1/51]
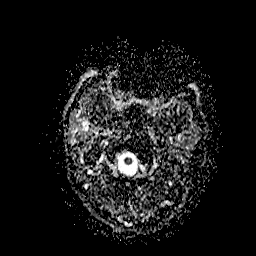
[im 11/51]
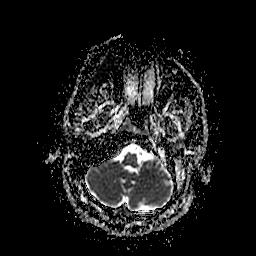
[im 21/51]
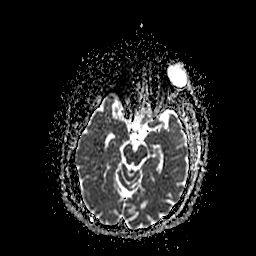
[im 31/51]
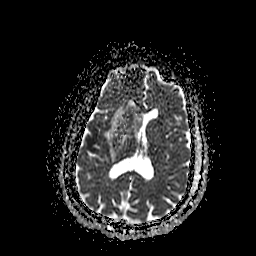
[im 41/51]
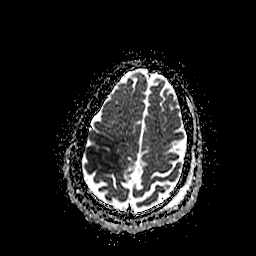
[im 51/51]
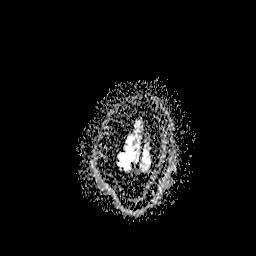

[Series 500: DWI · coronal · 5.0mm · 1.09mm/px · 4 of 36 slices shown (4 of 4)]
[im 1/36]
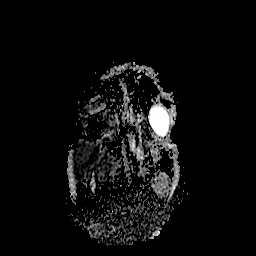
[im 12/36]
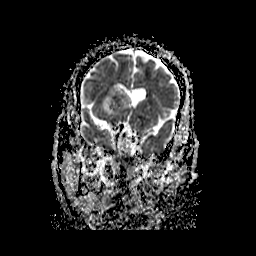
[im 24/36]
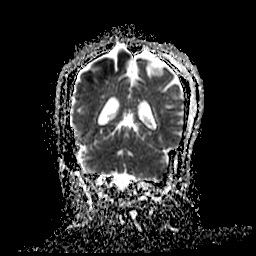
[im 36/36]
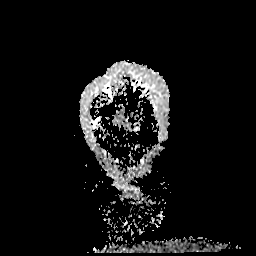

[38 of 48 positions shown; findings below may reference images not displayed]

FINDINGS: MRI HEAD FINDINGS

Brain: Acute infarct right posterior MCA territory involving the
right parietal cortex extending over the medial convexity. Patchy
acute infarct also in the right basal ganglia with moderate amount
of associated hemorrhage. This hemorrhage is mildly hyperdense on CT
most likely hemorrhagic transformation of basal ganglia infarct on
the right. There is local mass-effect on the right lateral ventricle
with minimal midline shift to the left. No hydrocephalus. No mass
lesion.

Vascular: Normal arterial flow voids.

Skull and upper cervical spine: No focal skull lesion.

Sinuses/Orbits: Mucosal edema paranasal sinuses with air-fluid level
in the sphenoid sinus. Negative orbit

Other: None

MRA HEAD FINDINGS

Both vertebral arteries widely patent to the basilar. PICA patent
bilaterally. Basilar widely patent. Superior cerebellar and
posterior cerebral arteries widely patent bilaterally.

Left internal carotid artery widely patent. Left anterior and middle
cerebral arteries widely patent. Azygos A2 segment.

Right internal carotid artery widely patent. Right A1 segment
patent. Common A2 segment.

Right M1 segment patent. Moderate stenosis in the anterior division
of the right M2 branch. No large vessel occlusion.
IMPRESSION: Acute right MCA territory involving basal ganglia and right parietal
lobe. There is hemorrhagic transformation of the right basal ganglia
infarct with mass-effect on the lateral ventricle.

Right M1 segment widely patent. Moderate stenosis right M2 segment
anteriorly. Presumably the thrombus has recanalized given the
symptoms are greater than 48 hours old.

## 2022-01-03 IMAGING — CT CT ANGIO NECK
2 of 6 series · 15 of 46 positions shown, 17 images · IV contrast (APPLIED)
Comparison: None.

CLINICAL DATA: Right MCA stroke

EXAM:
CT ANGIOGRAPHY NECK
TECHNIQUE: Multidetector CT imaging of the neck was performed using the
standard protocol during bolus administration of intravenous
contrast. Multiplanar CT image reconstructions and MIPs were
obtained to evaluate the vascular anatomy. Carotid stenosis
measurements (when applicable) are obtained utilizing NASCET
criteria, using the distal internal carotid diameter as the
denominator.
CONTRAST:  75 mL OMNIPAQUE IOHEXOL 350 MG/ML SOLN

[Series 5: cta neck 2.0 i30f 3 · axial · 0.47mm/px · z∈[-298,-52]mm · 12 of 143 slices shown, 14 images]
[im 10/143  soft-tissue]
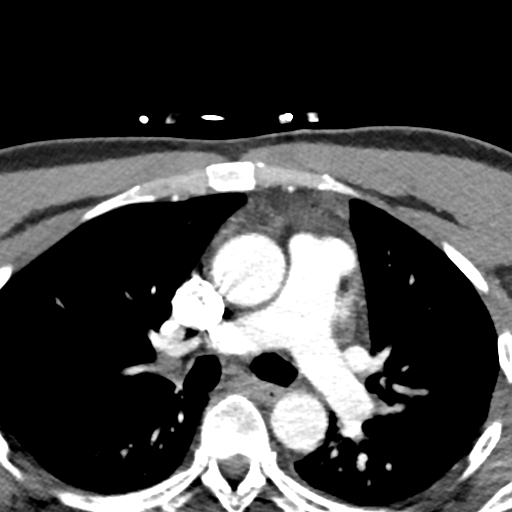
[im 10/143  bone]
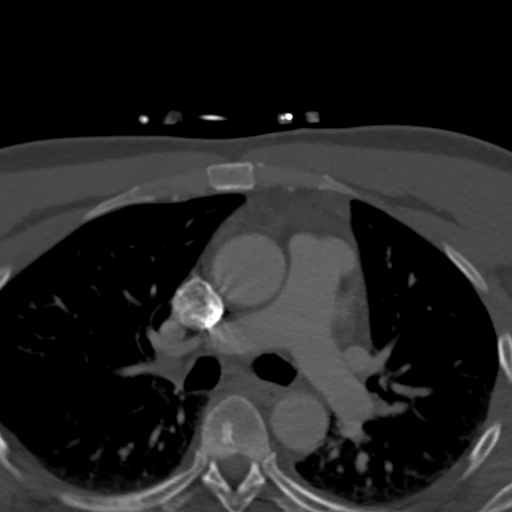
[im 19/143  soft-tissue]
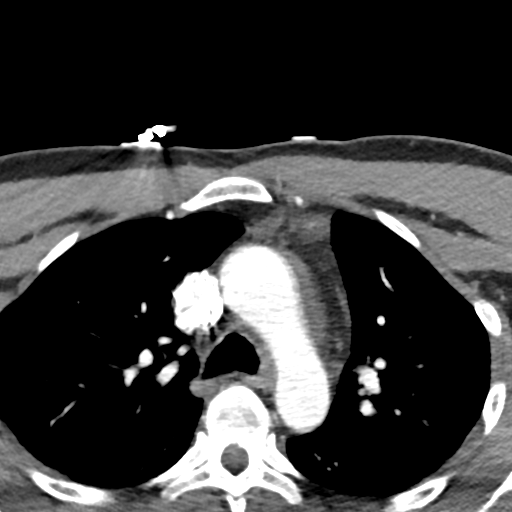
[im 34/143  soft-tissue]
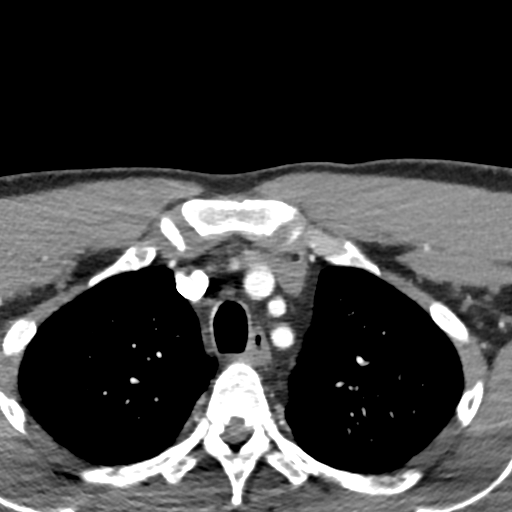
[im 43/143  soft-tissue]
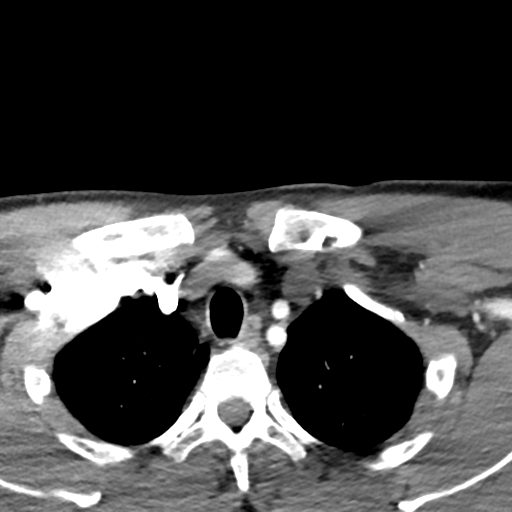
[im 53/143  soft-tissue]
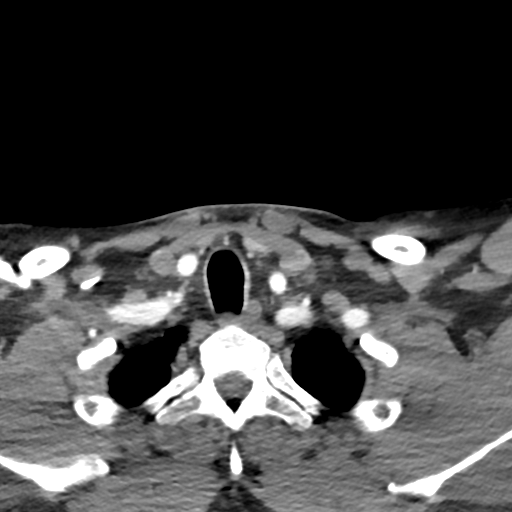
[im 67/143  soft-tissue]
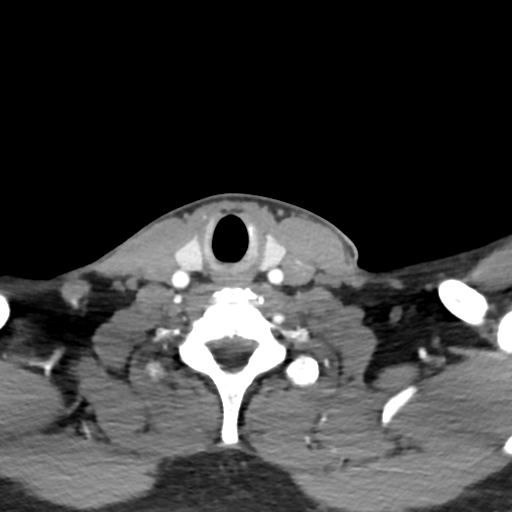
[im 76/143  soft-tissue]
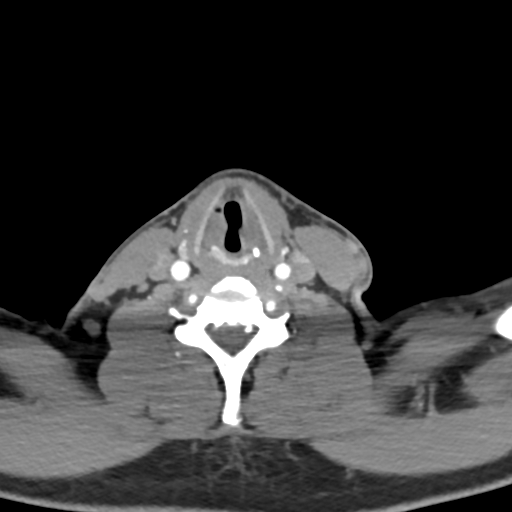
[im 90/143  soft-tissue]
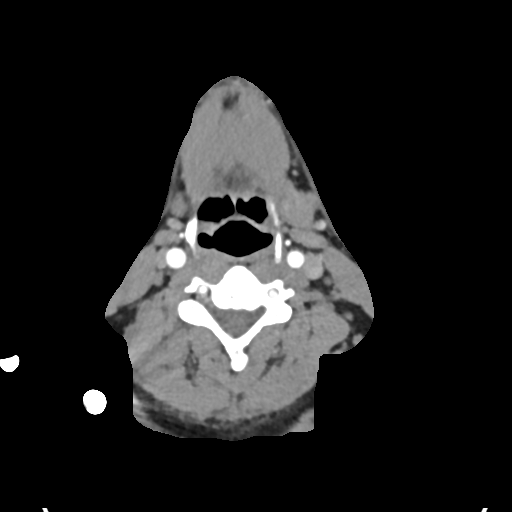
[im 100/143  soft-tissue]
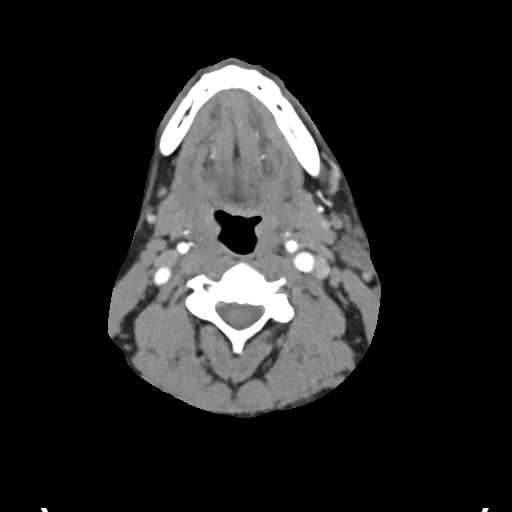
[im 100/143  bone]
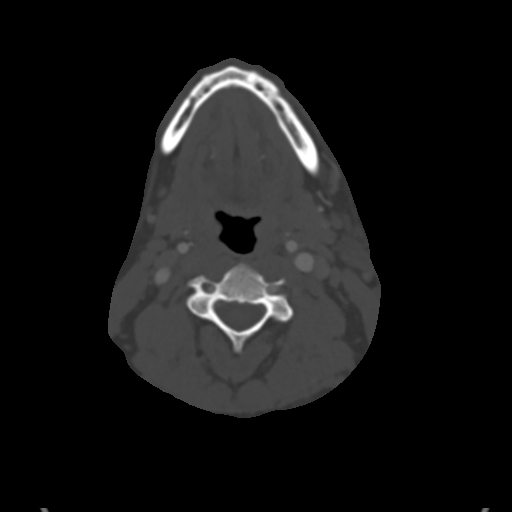
[im 109/143  soft-tissue]
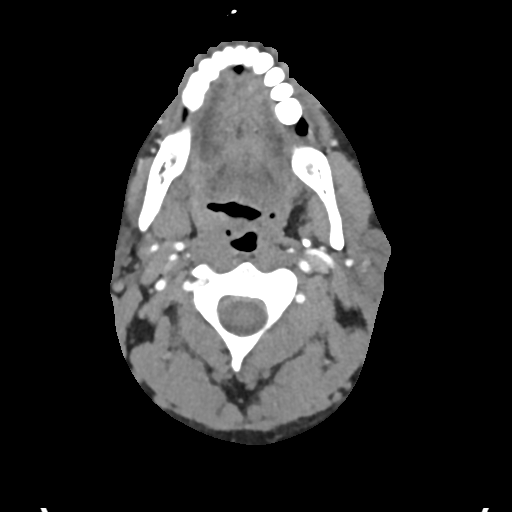
[im 124/143  soft-tissue]
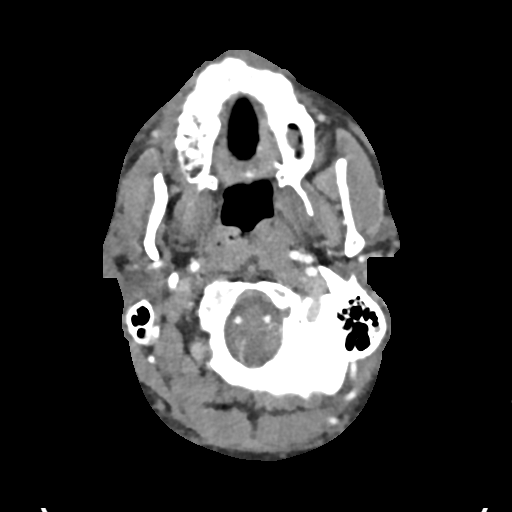
[im 133/143  soft-tissue]
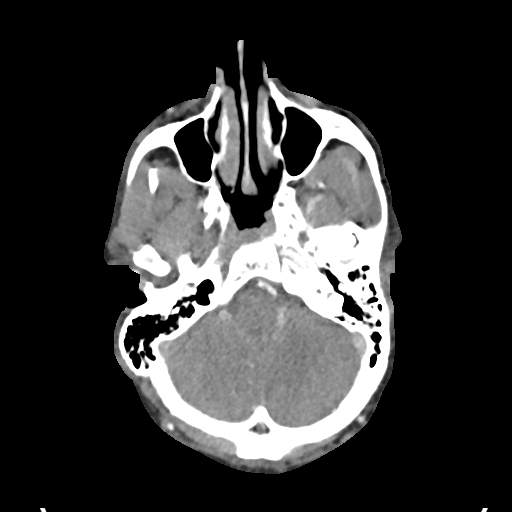

[Series 7: coronal thin · coronal · 0.36mm/px · 3 of 288 slices shown]
[im 83/288  soft-tissue]
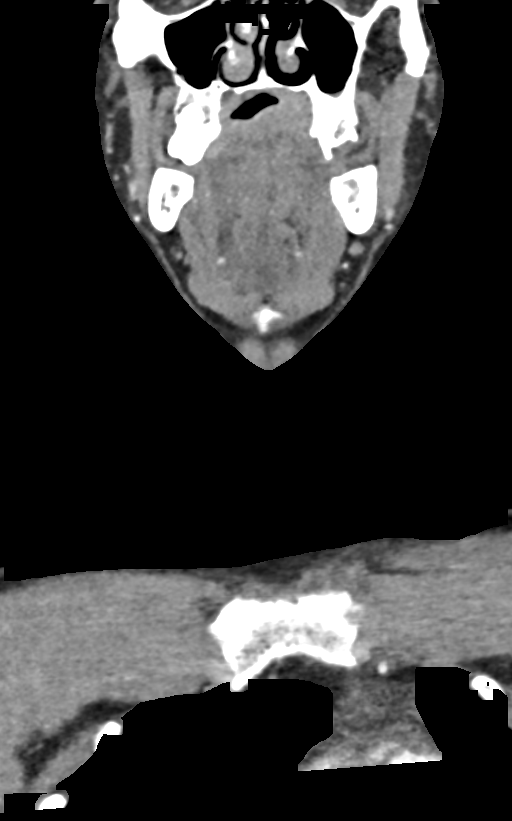
[im 124/288  soft-tissue]
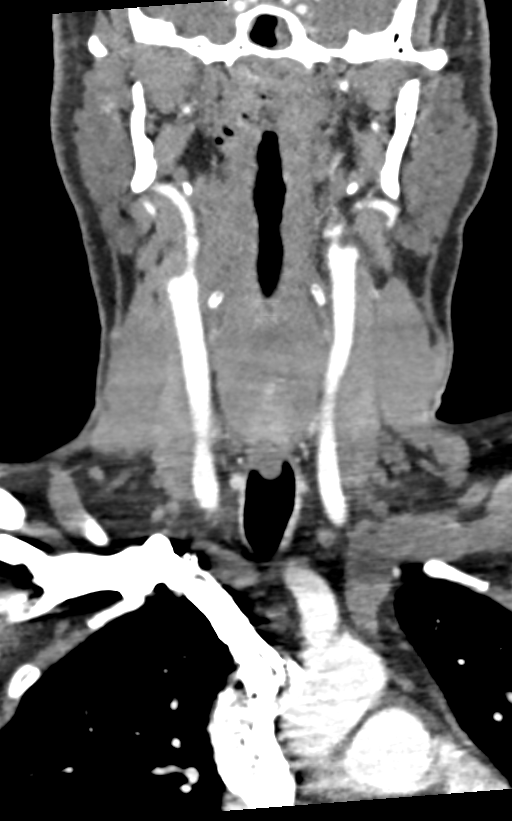
[im 165/288  soft-tissue]
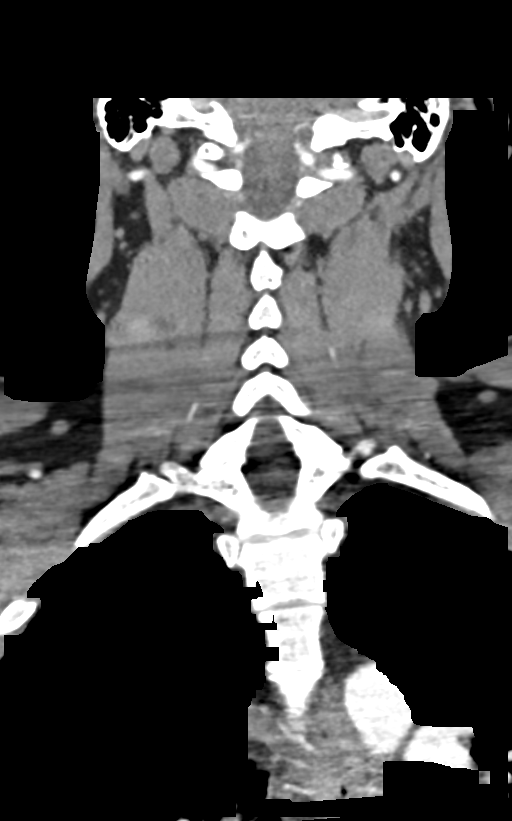

[15 of 46 positions shown; findings below may reference images not displayed]

FINDINGS: Aortic arch: Great vessel origins are patent.

Right carotid system: Patent. Minimal calcified plaque at the ICA
origin. There is no measurable stenosis or evidence of dissection.

Left carotid system: Patent. There is no measurable stenosis or
evidence of dissection.

Vertebral arteries: Patent. No measurable stenosis or evidence of
dissection.

Skeleton: Mild degenerative changes, greatest at C6-C7 and C5-C6.

Other neck: No mass or adenopathy.

Upper chest: No apical lung mass.
IMPRESSION: No large vessel occlusion or hemodynamically significant stenosis.

## 2022-01-05 ENCOUNTER — Ambulatory Visit: Payer: BC Managed Care – PPO | Admitting: Occupational Therapy

## 2022-01-05 ENCOUNTER — Encounter: Payer: Self-pay | Admitting: Occupational Therapy

## 2022-01-05 ENCOUNTER — Other Ambulatory Visit: Payer: Self-pay

## 2022-01-05 ENCOUNTER — Ambulatory Visit: Payer: BC Managed Care – PPO | Admitting: Physical Therapy

## 2022-01-05 DIAGNOSIS — R208 Other disturbances of skin sensation: Secondary | ICD-10-CM | POA: Diagnosis not present

## 2022-01-05 DIAGNOSIS — R41840 Attention and concentration deficit: Secondary | ICD-10-CM | POA: Diagnosis not present

## 2022-01-05 DIAGNOSIS — M79602 Pain in left arm: Secondary | ICD-10-CM

## 2022-01-05 DIAGNOSIS — G8110 Spastic hemiplegia affecting unspecified side: Secondary | ICD-10-CM

## 2022-01-05 DIAGNOSIS — R2689 Other abnormalities of gait and mobility: Secondary | ICD-10-CM | POA: Diagnosis not present

## 2022-01-05 DIAGNOSIS — R293 Abnormal posture: Secondary | ICD-10-CM

## 2022-01-05 DIAGNOSIS — R2681 Unsteadiness on feet: Secondary | ICD-10-CM

## 2022-01-05 DIAGNOSIS — M6281 Muscle weakness (generalized): Secondary | ICD-10-CM | POA: Diagnosis not present

## 2022-01-05 NOTE — Therapy (Signed)
[facility] ?[place], [place], [address] ?Phone: [phone]   Fax:  [phone] ? ?Occupational Therapy Treatment ? ?Patient Details  ?Name: Derek [name] ?MRN: [id number] ?Date of Birth: 1976/03/24 ?Referring Provider (OT): [name] ? ? ?Encounter Date: 01/05/2022 ? ? OT End of Session - 01/05/22 1507   ? ? Visit Number 8   ? Number of Visits 9   ? Date for OT Re-Evaluation 01/15/22   ? Authorization Type BCBS, PT/OT combined = 75 visits   ? OT Start Time 1407   ? OT Stop Time [id number]   ? OT Time Calculation (min) 38 min   ? ?  ?  ? ?  ? ? ?Past Medical History:  ?Diagnosis Date  ? Acute ischemic right MCA stroke ([facility]) 01/07/2020  ? Alcohol dependence ([place])   ? Back pain   ? Marijuana dependence ([place])   ? Tobacco dependence   ? ? ?Past Surgical History:  ?Procedure Laterality Date  ? BUBBLE STUDY  01/10/2020  ? Procedure: BUBBLE STUDY;  Surgeon: Geralynn Rile, MD;  Location: [facility];  Service: Cardiovascular;;  ? TEE WITHOUT CARDIOVERSION N/A 01/10/2020  ? Procedure: TRANSESOPHAGEAL ECHOCARDIOGRAM (TEE);  Surgeon: Geralynn Rile, MD;  Location: [facility];  Service: Cardiovascular;  Laterality: N/A;  ? ? ?There were no vitals filed for this visit. ? ? Subjective Assessment - 01/05/22 1421   ? ? Subjective  shoulder pain   ? Currently in Pain? Yes   ? Pain Score 2    ? Pain Location Shoulder   ? Pain Orientation Left   ? Pain Descriptors / Indicators Aching   ? Pain Type Chronic pain   ? Pain Onset More than a month ago   ? Pain Frequency Constant   ? Aggravating Factors  unsure   ? Pain Relieving Factors repositioning   ? ?  ?  ? ?  ? ? ? ? ? ? ? ? ?Treatment: Pt arrived wearing resting hand splint. Minor adjustments to splint at IP joints to avoid hyperextension at DIP joints. Marland Kitchen  ?Supine, closed chain self stretch  in shoulder flexion, min v.c for proper hand placement. ?Sidelying on left side while therapist performed gentle  P/ROM to elbow, forearm and digits. ?Weightbearing through left elbow with mod facilitation. ?Self stretch reach for the floor for shoulder flexion, elbow extension. ?AAR/OM shoulder flexion with UE ranger mod facilitation/ v.c ?Therapist applied resting hand splint at end of session. Pt reports wearing for several hours at a time during day without issues. ? ? ? ? ? ? ? ? ? ? ? ? ? ? ? ? OT Short Term Goals - 01/05/22 1509   ? ?  ? OT SHORT TERM GOAL #1  ? Title Patient will complete a self stretching program for left upper extremity   ? Baseline XX   ? Time 4   ? Period Weeks   ? Status On-going   reports going well at home - continue to review  ? Target Date 01/15/22   ?  ? OT SHORT TERM GOAL #2  ? Title Patient will don / doff front opening shirt or jacket excluding fasteners with increased time and mod assist   ? Baseline max-total   ? Time 4   ? Period Weeks   ? Status On-going   completes with min A today but reports aid does it at home 12/25/21  ?  ? OT SHORT TERM GOAL #3  ?  Title Patient will report pain of less than 4/10 with forward low reaching tasks   ? Baseline Pain 3 at rest, ranges up to 6-7 with passive movement   ? Time 4   ? Period Weeks   ? Status On-going   little active reaching with LUE  ?  ? OT SHORT TERM GOAL #4  ? Title Pt to bathe self with mod assist seated   ? Baseline xx   ? Period Weeks   ? Status On-going   ?  ? OT SHORT TERM GOAL #5  ? Title Patient to don/doff splint with mod assist   ? Baseline xx   ? Time 4   ? Period Weeks   ? Status On-going   min A today 12/25/21  ? ?  ?  ? ?  ? ? ? ? OT Long Term Goals - 12/01/21 1617   ? ?  ? OT LONG TERM GOAL #1  ? Title xxx   ? Baseline xxx   ?  ? OT LONG TERM GOAL #2  ? Title xxx   ? Baseline xxx   ?  ? OT LONG TERM GOAL #3  ? Title xxx   ? Baseline xxx   ?  ? OT LONG TERM GOAL #4  ? Title xxx   ? Baseline xxx   ? ?  ?  ? ?  ? ? ? ? ? ? ? ? Plan - 01/05/22 1508   ? ? Clinical Impression Statement Pt is progressing towards goals. Pt arrived  wearing resting hand splint. He reports he has an aide at home who assists with application.   ? OT Occupational Profile and History Detailed Assessment- Review of Records and additional review of physical, cognitive, psychosocial history related to current functional performance   ? Occupational performance deficits (Please refer to evaluation for details): ADL's;Leisure;Social Participation   ? Body Structure / Function / Physical Skills ADL;Decreased knowledge of use of DME;Obesity;Strength;Tone;Pain;GMC;Dexterity;Balance;Body mechanics;Edema;Proprioception;UE functional use;Vestibular;ROM;IADL;Endurance;Vision;Scar mobility;Coordination;Flexibility;Mobility;Sensation;Muscle spasms;FMC;Decreased knowledge of precautions   ? Cognitive Skills Attention;Emotional;Energy/Drive;Problem Solve;Safety Awareness;Sequencing;Thought   ? Rehab Potential Fair   ? Clinical Decision Making Several treatment options, min-mod task modification necessary   ? Comorbidities Affecting Occupational Performance: May have comorbidities impacting occupational performance   ? Modification or Assistance to Complete Evaluation  No modification of tasks or assist necessary to complete eval   ? OT Frequency 2x / week   ? OT Duration 4 weeks   ? OT Treatment/Interventions Self-care/ADL training;Moist Heat;Fluidtherapy;DME and/or AE instruction;Splinting;Balance training;Therapeutic activities;Cognitive remediation/compensation;Therapeutic exercise;Ultrasound;Neuromuscular education;Functional Mobility Training;Passive range of motion;Visual/perceptual remediation/compensation;Patient/family education;Manual Therapy;Electrical Stimulation   ? Plan check goals, anticipate d/c next visit   ? Consulted and Agree with Plan of Care Patient   ? ?  ?  ? ?  ? ? ?Patient will benefit from skilled therapeutic intervention in order to improve the following deficits and impairments:   ?Body Structure / Function / Physical Skills: ADL, Decreased knowledge  of use of DME, Obesity, Strength, Tone, Pain, GMC, Dexterity, Balance, Body mechanics, Edema, Proprioception, UE functional use, Vestibular, ROM, IADL, Endurance, Vision, Scar mobility, Coordination, Flexibility, Mobility, Sensation, Muscle spasms, FMC, Decreased knowledge of precautions ?Cognitive Skills: Attention, Emotional, Energy/Drive, Problem Solve, Safety Awareness, Sequencing, Thought ?  ? ? ?Visit Diagnosis: ?Muscle weakness (generalized) ? ?Unsteadiness on feet ? ?Other abnormalities of gait and mobility ? ?Spastic hemiplegia affecting nondominant side ([place]) ? ?Abnormal posture ? ?Other disturbances of skin sensation ? ?Attention and concentration deficit ? ?Pain in left  arm ? ?Muscle weakness ? ? ? ?Problem List ?Patient Active Problem List  ? Diagnosis Date Noted  ? Pain due to onychomycosis of toenails of both feet 06/10/2021  ? Neuropathic pain 04/09/2020  ? Pain   ? Spastic hemiparesis ([place])   ? History of hypertension   ? Dyslipidemia   ? 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 Leukocytosis 01/12/2020  ? Cerebrovascular accident (CVA) due to thrombosis of right middle cerebral artery ([facility])   ? Chronic pain syndrome   ? Hypokalemia   ? Dysphagia, post-stroke   ? Acute ischemic right MCA stroke ([facility]) 01/07/2020  ? Tobacco dependence   ? Marijuana dependence ([place])   ? Alcohol dependence ([place])   ? ? ?Ziva Nunziata, OT ?01/05/2022, 3:10 PM ? ?[facility] ?[place], [place], [address] ?Phone: [phone]   Fax:  [phone] ? ?Name: Derek [name] ?MRN: [id number] ?Date of Birth: [birth year] ? ?

## 2022-01-08 ENCOUNTER — Ambulatory Visit: Payer: BC Managed Care – PPO | Admitting: Physical Therapy

## 2022-01-08 ENCOUNTER — Ambulatory Visit: Payer: BC Managed Care – PPO | Admitting: Occupational Therapy

## 2022-01-08 ENCOUNTER — Other Ambulatory Visit: Payer: Self-pay

## 2022-01-08 ENCOUNTER — Encounter: Payer: Self-pay | Admitting: Occupational Therapy

## 2022-01-08 DIAGNOSIS — R41840 Attention and concentration deficit: Secondary | ICD-10-CM

## 2022-01-08 DIAGNOSIS — R2689 Other abnormalities of gait and mobility: Secondary | ICD-10-CM | POA: Diagnosis not present

## 2022-01-08 DIAGNOSIS — G8110 Spastic hemiplegia affecting unspecified side: Secondary | ICD-10-CM

## 2022-01-08 DIAGNOSIS — R208 Other disturbances of skin sensation: Secondary | ICD-10-CM

## 2022-01-08 DIAGNOSIS — R2681 Unsteadiness on feet: Secondary | ICD-10-CM | POA: Diagnosis not present

## 2022-01-08 DIAGNOSIS — M6281 Muscle weakness (generalized): Secondary | ICD-10-CM

## 2022-01-08 DIAGNOSIS — R293 Abnormal posture: Secondary | ICD-10-CM | POA: Diagnosis not present

## 2022-01-08 DIAGNOSIS — M79602 Pain in left arm: Secondary | ICD-10-CM | POA: Diagnosis not present

## 2022-01-08 NOTE — Therapy (Signed)
[facility] ?[place], [place], [address] ?Phone: [phone]   Fax:  [phone] ? ?Occupational Therapy Treatment & OT Discharge ? ?Patient Details  ?Name: Derek [name] ?MRN: [id number] ?Date of Birth: 11/15/1975 ?Referring Provider (OT): [name] ? ? ?Encounter Date: 01/08/2022 ? ? OT End of Session - 01/08/22 1321   ? ? Visit Number 9   ? Number of Visits 9   ? Date for OT Re-Evaluation 01/15/22   ? Authorization Type BCBS, PT/OT combined = 75 visits   ? OT Start Time 1318   ? OT Stop Time 1400   ? OT Time Calculation (min) 42 min   ? Activity Tolerance Patient tolerated treatment well   ? Behavior During Therapy WFL for tasks assessed/performed;Flat affect   ? ?  ?  ? ?  ? ? ?Past Medical History:  ?Diagnosis Date  ? Acute ischemic right MCA stroke ([facility]) 01/07/2020  ? Alcohol dependence ([place])   ? Back pain   ? Marijuana dependence ([place])   ? Tobacco dependence   ? ? ?Past Surgical History:  ?Procedure Laterality Date  ? BUBBLE STUDY  01/10/2020  ? Procedure: BUBBLE STUDY;  Surgeon: Geralynn Rile, MD;  Location: [facility];  Service: Cardiovascular;;  ? TEE WITHOUT CARDIOVERSION N/A 01/10/2020  ? Procedure: TRANSESOPHAGEAL ECHOCARDIOGRAM (TEE);  Surgeon: Geralynn Rile, MD;  Location: [facility];  Service: Cardiovascular;  Laterality: N/A;  ? ? ?There were no vitals filed for this visit. ? ? Subjective Assessment - 01/08/22 1320   ? ? Subjective  "you're making it rain out there" "i got a little more mobility"   ? Currently in Pain? Yes   ? Pain Score 3    ? Pain Location Shoulder   ? Pain Orientation Left   ? Pain Descriptors / Indicators Aching;Sore   ? Pain Type Chronic pain   ? Pain Onset More than a month ago   ? Pain Frequency Constant   ? ?  ?  ? ?  ? ? ?OCCUPATIONAL THERAPY DISCHARGE SUMMARY ? ?Visits from Start of Care: 9 ? ?Current functional level related to goals / functional outcomes: ?Pt progressed with  increased independence with ADLs and increased range of motion and stretch program for LUE shoulder. Pt reports decreased pain in LUE. Pt is independent with wear and care instructions and donning and doffing splint with some assistance. ?  ?Remaining deficits: ?Pt continues with significant hemiparesis in LUE impeding overall independence with ADLs and IADLs. ?  ?Education / Equipment: ?HEPs for stretching program, caregiver education and splint (custom resting hand splint)  ? ?Patient agrees to discharge. Patient goals were met. Patient is being discharged due to meeting the stated rehab goals.. ? ? ? ? ? ? ? ? ? ? ? ? ? ? ? OT Treatments/Exercises (OP) - 01/08/22 1346   ? ?  ? Modalities  ? Modalities Moist Heat   ?  ? Moist Heat Therapy  ? Number Minutes Moist Heat 10 Minutes   ? Moist Heat Location Shoulder   LUE  ?  ? Splinting  ? 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   ? ?  ?  ? ?  ? ? ? ? ? ? ? ? ? ? ? OT Short Term Goals - 01/08/22 1322   ? ?  ? OT SHORT TERM GOAL #1  ? Title  Patient will complete a self stretching program for left upper extremity   ? Baseline XX   ? Time 4   ? Period Weeks   ? Status Achieved   reports going well at home  ? Target Date 01/15/22   ?  ? OT SHORT TERM GOAL #2  ? Title Patient will don / doff front opening shirt or jacket excluding fasteners with increased time and mod assist   ? Baseline max-total   ? Time 4   ? Period Weeks   ? Status Achieved   completes with min A today but reports aid does it at home 12/25/21, 01/08/22  ?  ? OT SHORT TERM GOAL #3  ? Title Patient will report pain of less than 4/10 with forward low reaching tasks   ? Baseline Pain 3 at rest, ranges up to 6-7 with passive movement   ? Time 4   ? Period Weeks   ? Status Achieved   little active reaching with LUE 0/10 pain  ?  ? OT SHORT TERM GOAL #4  ? Title Pt to bathe self with mod assist seated   ? Baseline xx   ? Period Weeks    ? Status Achieved   ?  ? OT SHORT TERM GOAL #5  ? Title Patient to don/doff splint with mod assist   ? Baseline xx   ? Time 4   ? Period Weeks   ? Status Achieved   min A today 12/25/21, 01/08/22  ? ?  ?  ? ?  ? ? ? ? OT Long Term Goals - 12/01/21 1617   ? ?  ? OT LONG TERM GOAL #1  ? Title xxx   ? Baseline xxx   ?  ? OT LONG TERM GOAL #2  ? Title xxx   ? Baseline xxx   ?  ? OT LONG TERM GOAL #3  ? Title xxx   ? Baseline xxx   ?  ? OT LONG TERM GOAL #4  ? Title xxx   ? Baseline xxx   ? ?  ?  ? ?  ? ? ? ? ? ? ? ? Plan - 01/08/22 1348   ? ? Clinical Impression Statement Pt has met all goals for occupational therapy and is ready for discharge at this time.   ? OT Occupational Profile and History Detailed Assessment- Review of Records and additional review of physical, cognitive, psychosocial history related to current functional performance   ? Occupational performance deficits (Please refer to evaluation for details): ADL's;Leisure;Social Participation   ? Body Structure / Function / Physical Skills ADL;Decreased knowledge of use of DME;Obesity;Strength;Tone;Pain;GMC;Dexterity;Balance;Body mechanics;Edema;Proprioception;UE functional use;Vestibular;ROM;IADL;Endurance;Vision;Scar mobility;Coordination;Flexibility;Mobility;Sensation;Muscle spasms;FMC;Decreased knowledge of precautions   ? Cognitive Skills Attention;Emotional;Energy/Drive;Problem Solve;Safety Awareness;Sequencing;Thought   ? Rehab Potential Fair   ? Clinical Decision Making Several treatment options, min-mod task modification necessary   ? Comorbidities Affecting Occupational Performance: May have comorbidities impacting occupational performance   ? Modification or Assistance to Complete Evaluation  No modification of tasks or assist necessary to complete eval   ? OT Frequency 2x / week   ? OT Duration 4 weeks   ? OT Treatment/Interventions Self-care/ADL training;Moist Heat;Fluidtherapy;DME and/or AE instruction;Splinting;Balance training;Therapeutic  activities;Cognitive remediation/compensation;Therapeutic exercise;Ultrasound;Neuromuscular education;Functional Mobility Training;Passive range of motion;Visual/perceptual remediation/compensation;Patient/family education;Manual Therapy;Electrical Stimulation   ? Plan OT discharge   ? Consulted and Agree with Plan of Care Patient   ? ?  ?  ? ?  ? ? ?Patient will benefit from skilled therapeutic intervention  in order to improve the following deficits and impairments:   ?Body Structure / Function / Physical Skills: ADL, Decreased knowledge of use of DME, Obesity, Strength, Tone, Pain, GMC, Dexterity, Balance, Body mechanics, Edema, Proprioception, UE functional use, Vestibular, ROM, IADL, Endurance, Vision, Scar mobility, Coordination, Flexibility, Mobility, Sensation, Muscle spasms, FMC, Decreased knowledge of precautions ?Cognitive Skills: Attention, Emotional, Energy/Drive, Problem Solve, Safety Awareness, Sequencing, Thought ?  ? ? ?Visit Diagnosis: ?Muscle weakness (generalized) ? ?Other abnormalities of gait and mobility ? ?Unsteadiness on feet ? ?Spastic hemiplegia affecting nondominant side ([place]) ? ?Other disturbances of skin sensation ? ?Attention and concentration deficit ? ? ? ?Problem List ?Patient Active Problem List  ? Diagnosis Date Noted  ? Pain due to onychomycosis of toenails of both feet 06/10/2021  ? Neuropathic pain 04/09/2020  ? Pain   ? Spastic hemiparesis ([place])   ? History of hypertension   ? Dyslipidemia   ? 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 Leukocytosis 01/12/2020  ? Cerebrovascular accident (CVA) due to thrombosis of right middle cerebral artery ([facility])   ? Chronic pain syndrome   ? Hypokalemia   ? Dysphagia, post-stroke   ? Acute ischemic right MCA stroke ([facility]) 01/07/2020  ? Tobacco dependence   ? Marijuana dependence ([place])   ? Alcohol dependence ([place])   ? ? ?Zachery Conch, OT ?01/08/2022, 1:56 PM ? ?[facility] ?[address] ?[place], [place], [address] ?Phone: [phone]   Fax:  [phone] ? ?Name: Derek [name] ?MRN: [id number] ?Date of Birth: 10-27-1976 ? ?

## 2022-02-01 ENCOUNTER — Ambulatory Visit (INDEPENDENT_AMBULATORY_CARE_PROVIDER_SITE_OTHER): Payer: BC Managed Care – PPO | Admitting: Podiatry

## 2022-02-01 ENCOUNTER — Encounter: Payer: Self-pay | Admitting: Podiatry

## 2022-02-01 DIAGNOSIS — I6990 Unspecified sequelae of unspecified cerebrovascular disease: Secondary | ICD-10-CM | POA: Insufficient documentation

## 2022-02-01 DIAGNOSIS — M79674 Pain in right toe(s): Secondary | ICD-10-CM

## 2022-02-01 DIAGNOSIS — I69959 Hemiplegia and hemiparesis following unspecified cerebrovascular disease affecting unspecified side: Secondary | ICD-10-CM | POA: Insufficient documentation

## 2022-02-01 DIAGNOSIS — I63311 Cerebral infarction due to thrombosis of right middle cerebral artery: Secondary | ICD-10-CM

## 2022-02-01 DIAGNOSIS — I6529 Occlusion and stenosis of unspecified carotid artery: Secondary | ICD-10-CM | POA: Insufficient documentation

## 2022-02-01 DIAGNOSIS — I10 Essential (primary) hypertension: Secondary | ICD-10-CM | POA: Insufficient documentation

## 2022-02-01 DIAGNOSIS — F1911 Other psychoactive substance abuse, in remission: Secondary | ICD-10-CM | POA: Insufficient documentation

## 2022-02-01 DIAGNOSIS — B351 Tinea unguium: Secondary | ICD-10-CM | POA: Diagnosis not present

## 2022-02-01 DIAGNOSIS — E669 Obesity, unspecified: Secondary | ICD-10-CM | POA: Insufficient documentation

## 2022-02-01 DIAGNOSIS — D509 Iron deficiency anemia, unspecified: Secondary | ICD-10-CM | POA: Insufficient documentation

## 2022-02-01 DIAGNOSIS — M79675 Pain in left toe(s): Secondary | ICD-10-CM

## 2022-02-01 DIAGNOSIS — E7800 Pure hypercholesterolemia, unspecified: Secondary | ICD-10-CM | POA: Insufficient documentation

## 2022-02-01 NOTE — Progress Notes (Signed)
This patient presents to the office for evaluation and treatment of long thick painful nails .  This patient is unable to trim his own nails since the patient cannot reach his feet.  Patient says the nails are painful walking and wearing his shoes.  Patient has history of CVA .   He returns for preventive foot care services.  General Appearance  Alert, conversant and in no acute stress.  Vascular  Dorsalis pedis and posterior tibial  pulses are palpable  bilaterally.  Capillary return is within normal limits  bilaterally. Temperature is within normal limits  bilaterally.  Neurologic  Senn-Weinstein monofilament wire test within normal limits  bilaterally. Muscle power within normal limits bilaterally.  Nails Thick disfigured discolored nails with subungual debris  from hallux to fifth toes bilaterally. No evidence of bacterial infection or drainage bilaterally.  Orthopedic  No limitations of motion  feet .  No crepitus or effusions noted.  No bony pathology or digital deformities noted.  Skin  normotropic skin with no porokeratosis noted bilaterally.  No signs of infections or ulcers noted.     Onychomycosis  Pain in toes right foot  Pain in toes left foot  Debridement  of nails  1-5  B/L with a nail nipper.  Nails were then filed using a dremel tool with no incidents.    RTC  10 weeks    Zaniel Marineau DPM   

## 2022-02-03 ENCOUNTER — Encounter: Payer: BC Managed Care – PPO | Admitting: Physical Medicine & Rehabilitation

## 2022-03-03 DIAGNOSIS — R748 Abnormal levels of other serum enzymes: Secondary | ICD-10-CM | POA: Diagnosis not present

## 2022-03-03 DIAGNOSIS — R7301 Impaired fasting glucose: Secondary | ICD-10-CM | POA: Diagnosis not present

## 2022-03-03 DIAGNOSIS — E7800 Pure hypercholesterolemia, unspecified: Secondary | ICD-10-CM | POA: Diagnosis not present

## 2022-03-03 DIAGNOSIS — I10 Essential (primary) hypertension: Secondary | ICD-10-CM | POA: Diagnosis not present

## 2022-03-17 ENCOUNTER — Other Ambulatory Visit: Payer: Self-pay | Admitting: Physical Medicine & Rehabilitation

## 2022-03-21 ENCOUNTER — Other Ambulatory Visit: Payer: Self-pay | Admitting: Physical Medicine & Rehabilitation

## 2022-03-21 DIAGNOSIS — I63311 Cerebral infarction due to thrombosis of right middle cerebral artery: Secondary | ICD-10-CM

## 2022-03-21 DIAGNOSIS — G8110 Spastic hemiplegia affecting unspecified side: Secondary | ICD-10-CM

## 2022-04-14 ENCOUNTER — Encounter: Payer: Self-pay | Admitting: Podiatry

## 2022-04-14 ENCOUNTER — Ambulatory Visit (INDEPENDENT_AMBULATORY_CARE_PROVIDER_SITE_OTHER): Payer: BC Managed Care – PPO | Admitting: Podiatry

## 2022-04-14 DIAGNOSIS — M792 Neuralgia and neuritis, unspecified: Secondary | ICD-10-CM | POA: Diagnosis not present

## 2022-04-14 DIAGNOSIS — B351 Tinea unguium: Secondary | ICD-10-CM | POA: Diagnosis not present

## 2022-04-14 DIAGNOSIS — I63311 Cerebral infarction due to thrombosis of right middle cerebral artery: Secondary | ICD-10-CM | POA: Diagnosis not present

## 2022-04-14 DIAGNOSIS — M79674 Pain in right toe(s): Secondary | ICD-10-CM

## 2022-04-14 DIAGNOSIS — M79675 Pain in left toe(s): Secondary | ICD-10-CM | POA: Diagnosis not present

## 2022-04-14 DIAGNOSIS — F321 Major depressive disorder, single episode, moderate: Secondary | ICD-10-CM | POA: Insufficient documentation

## 2022-04-14 NOTE — Progress Notes (Signed)
tr

## 2022-04-14 NOTE — Progress Notes (Signed)
This patient presents to the office for evaluation and treatment of long thick painful nails .  This patient is unable to trim his own nails since the patient cannot reach his feet.  Patient says the nails are painful walking and wearing his shoes.  Patient has history of CVA .   He returns for preventive foot care services.  General Appearance  Alert, conversant and in no acute stress.  Vascular  Dorsalis pedis and posterior tibial  pulses are palpable  bilaterally.  Capillary return is within normal limits  bilaterally. Temperature is within normal limits  bilaterally.  Neurologic  Senn-Weinstein monofilament wire test within normal limits  bilaterally. Muscle power within normal limits bilaterally.  Nails Thick disfigured discolored nails with subungual debris  from hallux to fifth toes bilaterally. No evidence of bacterial infection or drainage bilaterally.  Orthopedic  No limitations of motion  feet .  No crepitus or effusions noted.  No bony pathology or digital deformities noted.  Skin  normotropic skin with no porokeratosis noted bilaterally.  No signs of infections or ulcers noted.     Onychomycosis  Pain in toes right foot  Pain in toes left foot  Debridement  of nails  1-5  B/L with a nail nipper.  Nails were then filed using a dremel tool with no incidents.    RTC  10 weeks    Jernard Reiber DPM   

## 2022-05-12 ENCOUNTER — Ambulatory Visit: Payer: BC Managed Care – PPO | Admitting: Physical Medicine & Rehabilitation

## 2022-06-09 ENCOUNTER — Encounter: Payer: Self-pay | Admitting: Physical Medicine & Rehabilitation

## 2022-06-09 ENCOUNTER — Encounter
Payer: BC Managed Care – PPO | Attending: Physical Medicine & Rehabilitation | Admitting: Physical Medicine & Rehabilitation

## 2022-06-09 VITALS — BP 136/90 | HR 70 | Ht 71.0 in | Wt 229.0 lb

## 2022-06-09 DIAGNOSIS — G8110 Spastic hemiplegia affecting unspecified side: Secondary | ICD-10-CM | POA: Diagnosis not present

## 2022-06-09 DIAGNOSIS — I63511 Cerebral infarction due to unspecified occlusion or stenosis of right middle cerebral artery: Secondary | ICD-10-CM | POA: Insufficient documentation

## 2022-06-09 DIAGNOSIS — G894 Chronic pain syndrome: Secondary | ICD-10-CM | POA: Diagnosis not present

## 2022-06-09 MED ORDER — OXYCODONE HCL 5 MG PO TABS: 5.0000 mg | ORAL_TABLET | ORAL | 0 refills | Status: DC | PRN

## 2022-06-09 NOTE — Patient Instructions (Signed)
PLEASE FEEL FREE TO CALL OUR OFFICE WITH ANY PROBLEMS OR QUESTIONS ([phone])      

## 2022-06-09 NOTE — Progress Notes (Signed)
Botox Injection for spasticity of upper extremity using needle EMG guidance Indication: Right middle cerebral artery stroke (HCC)  Spastic hemiparesis (HCC) G81.10  Dilution: 100 Units/ml        Total Units Injected: 500 Indication: Severe spasticity which interferes with ADL,mobility and/or  hygiene and is unresponsive to medication management and other conservative care Informed consent was obtained after describing risks and benefits of the procedure with the patient. This includes bleeding, bruising, infection, excessive weakness, or medication side effects. A REMS form is on file and signed.  Needle: 40mm injectable monopolar needle electrode    Number of units per muscle Pectoralis Major 75 units Pectoralis Minor 75 units Biceps 0 units Brachioradialis 0 units FCR 25 units FCU 25 units FDS 100 units FDP 100 units FPL 0 units Palmaris Longus 50 units Pronator Teres 50 units Pronator Quadratus 0 units Lumbricals 0 units All injections were done after obtaining appropriate EMG activity and after negative drawback for blood. The patient tolerated the procedure well. Post procedure instructions were given. Return in about 3 months (around 09/09/2022) for follow up AFTER botox.

## 2022-06-11 DIAGNOSIS — R7303 Prediabetes: Secondary | ICD-10-CM | POA: Diagnosis not present

## 2022-06-11 DIAGNOSIS — Z125 Encounter for screening for malignant neoplasm of prostate: Secondary | ICD-10-CM | POA: Diagnosis not present

## 2022-06-11 DIAGNOSIS — Z0000 Encounter for general adult medical examination without abnormal findings: Secondary | ICD-10-CM | POA: Diagnosis not present

## 2022-06-11 DIAGNOSIS — I6529 Occlusion and stenosis of unspecified carotid artery: Secondary | ICD-10-CM | POA: Diagnosis not present

## 2022-06-11 DIAGNOSIS — I10 Essential (primary) hypertension: Secondary | ICD-10-CM | POA: Diagnosis not present

## 2022-06-11 DIAGNOSIS — E7800 Pure hypercholesterolemia, unspecified: Secondary | ICD-10-CM | POA: Diagnosis not present

## 2022-06-30 ENCOUNTER — Ambulatory Visit (INDEPENDENT_AMBULATORY_CARE_PROVIDER_SITE_OTHER): Payer: BC Managed Care – PPO | Admitting: Podiatry

## 2022-06-30 ENCOUNTER — Encounter: Payer: Self-pay | Admitting: Podiatry

## 2022-06-30 DIAGNOSIS — B351 Tinea unguium: Secondary | ICD-10-CM

## 2022-06-30 DIAGNOSIS — M79675 Pain in left toe(s): Secondary | ICD-10-CM

## 2022-06-30 DIAGNOSIS — I63311 Cerebral infarction due to thrombosis of right middle cerebral artery: Secondary | ICD-10-CM

## 2022-06-30 DIAGNOSIS — M79674 Pain in right toe(s): Secondary | ICD-10-CM

## 2022-06-30 DIAGNOSIS — M792 Neuralgia and neuritis, unspecified: Secondary | ICD-10-CM

## 2022-06-30 NOTE — Progress Notes (Signed)
This patient presents to the office for evaluation and treatment of long thick painful nails .  This patient is unable to trim his own nails since the patient cannot reach his feet.  Patient says the nails are painful walking and wearing his shoes.  Patient has history of CVA .   He returns for preventive foot care services.  General Appearance  Alert, conversant and in no acute stress.  Vascular  Dorsalis pedis and posterior tibial  pulses are palpable  bilaterally.  Capillary return is within normal limits  bilaterally. Temperature is within normal limits  bilaterally.  Neurologic  Senn-Weinstein monofilament wire test within normal limits  bilaterally. Muscle power within normal limits bilaterally.  Nails Thick disfigured discolored nails with subungual debris  from hallux to fifth toes bilaterally. No evidence of bacterial infection or drainage bilaterally.  Orthopedic  No limitations of motion  feet .  No crepitus or effusions noted.  No bony pathology or digital deformities noted.  Skin  normotropic skin with no porokeratosis noted bilaterally.  No signs of infections or ulcers noted.     Onychomycosis  Pain in toes right foot  Pain in toes left foot  Debridement  of nails  1-5  B/L with a nail nipper.  Nails were then filed using a dremel tool with no incidents.    RTC  10 weeks    Tilton Marsalis DPM   

## 2022-07-01 ENCOUNTER — Telehealth: Payer: Self-pay | Admitting: Physical Medicine & Rehabilitation

## 2022-07-01 NOTE — Telephone Encounter (Signed)
I have left a message for Nilda Calamity with [facility]  about patient needing to make an appt with Dr. Riley Kill for eval for Custom Myoelectric arm brace (The MyoPro).  I will leave the paperwork up front in blue folder once patient makes appt for this, we can put in red chard.

## 2022-07-04 ENCOUNTER — Other Ambulatory Visit: Payer: Self-pay | Admitting: Physical Medicine & Rehabilitation

## 2022-07-04 DIAGNOSIS — I63311 Cerebral infarction due to thrombosis of right middle cerebral artery: Secondary | ICD-10-CM

## 2022-07-04 DIAGNOSIS — G8110 Spastic hemiplegia affecting unspecified side: Secondary | ICD-10-CM

## 2022-07-17 ENCOUNTER — Ambulatory Visit (HOSPITAL_COMMUNITY)
Admission: EM | Admit: 2022-07-17 | Discharge: 2022-07-17 | Disposition: A | Discharge status: Home / Self Care | Payer: BC Managed Care – PPO | Attending: Physician Assistant | Admitting: Physician Assistant

## 2022-07-17 ENCOUNTER — Encounter (HOSPITAL_COMMUNITY): Payer: Self-pay

## 2022-07-17 DIAGNOSIS — Z1152 Encounter for screening for COVID-19: Secondary | ICD-10-CM | POA: Insufficient documentation

## 2022-07-17 DIAGNOSIS — J40 Bronchitis, not specified as acute or chronic: Secondary | ICD-10-CM | POA: Insufficient documentation

## 2022-07-17 DIAGNOSIS — J069 Acute upper respiratory infection, unspecified: Secondary | ICD-10-CM | POA: Diagnosis present

## 2022-07-17 LAB — RESP PANEL BY RT-PCR (FLU A&B, COVID) ARPGX2
Influenza A by PCR: NEGATIVE
Influenza B by PCR: NEGATIVE
SARS Coronavirus 2 by RT PCR: NEGATIVE

## 2022-07-17 MED ORDER — DM-GUAIFENESIN ER 30-600 MG PO TB12: 1.0000 | ORAL_TABLET | Freq: Two times a day (BID) | ORAL | 0 refills | Status: DC

## 2022-07-17 MED ORDER — DOXYCYCLINE HYCLATE 100 MG PO CAPS: 100.0000 mg | ORAL_CAPSULE | Freq: Two times a day (BID) | ORAL | 0 refills | Status: DC

## 2022-07-17 NOTE — ED Provider Notes (Signed)
[facility]    CSN: [id number] Arrival date & time: 07/17/22  1013      History   Chief Complaint Chief Complaint  Patient presents with   Cough   Fever   Generalized Body Aches    HPI Derek [name] is a 46 y.o. male.   46 year old male presents with cough and congestion.  Patient indicates for the past 3 days he has been having increasing chest congestion with cough that is uncontrollable at times.  He relates that the production he is bringing up is green and thick.  He also indicates he is having some upper respiratory congestion with rhinitis, postnasal drip, and purulent production.  He indicates he is having fatigue, chills, body aches.  He denies having fever.  He relates he has been around his son recently who was sick but he did not know exactly what he had.  Patient indicates he is not getting relief from OTC medications.  He indicates he is not having any wheezing or shortness of breath, and he is tolerating fluids well.   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 ([place]) 02/01/2022   History of substance abuse ([place]) 02/01/2022   Iron deficiency anemia 02/01/2022   Late effects of cerebrovascular disease 02/01/2022   Obesity 02/01/2022   Pure hypercholesterolemia 02/01/2022   Pain due to onychomycosis of toenails of both feet 06/10/2021   Neuropathic pain 04/09/2020   Pain    Spastic hemiparesis (HCC)    History of hypertension    Dyslipidemia    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   Leukocytosis 01/12/2020   Cerebrovascular accident (CVA) due to thrombosis of right middle cerebral artery (HCC)    Chronic pain syndrome    Hypokalemia    Dysphagia, post-stroke    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 Authorizing Provider  dextromethorphan-guaiFENesin (MUCINEX DM) 30-600 MG 12hr tablet Take 1 tablet by mouth 2 (two) times daily. 07/17/22  Yes Nyoka Lint, PA-C  doxycycline (VIBRAMYCIN) 100 MG capsule Take 1 capsule (100 mg total) by mouth 2 (two) times daily. 07/17/22  Yes Nyoka Lint, PA-C  aspirin 325 MG tablet 1 tablet    [provider]  atorvastatin (LIPITOR) 80 MG tablet 1 tablet    [provider]  baclofen (LIORESAL) 20 MG tablet Take 1 tablet by mouth 4 times daily 07/06/22  Meredith Staggers, MD  BINAXNOW COVID-19 AG HOME TEST KIT See admin instructions. 09/29/21   [provider]  DULoxetine (CYMBALTA) 30 MG capsule 1 capsule 03/03/22   [provider]  folic acid (FOLVITE) 1 MG tablet Take 1 tablet by mouth once daily 08/10/21   Meredith Staggers, MD  metoprolol tartrate (LOPRESSOR) 25 MG tablet Take 1 tablet (25 mg total) by mouth 2 (two) times daily. 03/04/20 03/04/21  Meredith Staggers, MD  Multiple Vitamin (MULTIVITAMIN WITH MINERALS) TABS tablet Take 1 tablet by mouth daily. 01/16/20   Raiford Noble  Latif, DO  oxyCODONE (OXY IR/ROXICODONE) 5 MG immediate release tablet Take 1 tablet (5 mg total) by mouth every 4 (four) hours as needed for severe pain. 06/09/22   Meredith Staggers, MD  pregabalin (LYRICA) 50 MG capsule TAKE 1 CAPSULE BY MOUTH THREE TIMES DAILY 03/17/22   Meredith Staggers, MD  tiZANidine (ZANAFLEX) 4 MG tablet Take 1 tablet by mouth 4 times daily 07/06/22   Meredith Staggers, MD    Family History Family History  Problem Relation Age of Onset   Stroke Neg Hx    CAD Neg Hx     Social History Social History   Tobacco Use   Smoking status: Every Day    Packs/day: 0.20    Types: Cigarettes   Smokeless tobacco: Never  Vaping Use   Vaping Use: Never used  Substance Use Topics   Alcohol use: Yes    Comment: 2-3 times a week.    Drug use: Not Currently    Types: Marijuana     Allergies   Patient has no known allergies.   Review of Systems Review of Systems  Constitutional:  Positive for fever.  HENT:  Positive for postnasal drip and sinus pressure.   Respiratory:  Positive for cough.      Physical Exam Triage Vital Signs ED Triage Vitals [07/17/22 1102]  Enc Vitals Group     BP (!) 170/120     Pulse Rate (!) 116     Resp 18     Temp 98.6 F (37 C)     Temp Source Oral     SpO2 98 %     Weight      Height      Head Circumference      Peak Flow      Pain Score      Pain Loc      Pain Edu?      Excl. in [place]?    No data found.  Updated Vital Signs BP (!) 170/120 (BP Location: Left Arm)   Pulse (!) 116   Temp 98.6 F (37 C) (Oral)   Resp 18   SpO2 98%   Visual Acuity Right Eye Distance:   Left Eye Distance:   Bilateral Distance:    Right Eye Near:   Left Eye Near:    Bilateral Near:     Physical Exam Constitutional:      Appearance: Normal appearance.  HENT:     Right Ear: Tympanic membrane and ear canal normal.     Left Ear: Tympanic membrane and ear canal normal.     Mouth/Throat:     Mouth: Mucous membranes are moist.      Pharynx: Oropharynx is clear.  Cardiovascular:     Rate and Rhythm: Normal rate and regular rhythm.     Heart sounds: Normal heart sounds.  Pulmonary:     Effort: Pulmonary effort is  normal.     Breath sounds: Normal breath sounds and air entry. No wheezing, rhonchi or rales.  Lymphadenopathy:     Cervical: No cervical adenopathy.  Neurological:     Mental Status: He is alert.      UC Treatments / Results  Labs (all labs ordered are listed, but only abnormal results are displayed) Labs Reviewed - No data to display  EKG   Radiology No results found.  Procedures Procedures (including critical care time)  Medications Ordered in UC Medications - No data to display  Initial Impression / Assessment and Plan / UC Course  I have reviewed the triage vital signs and the nursing notes.  Pertinent labs & imaging results that were available during my care of the patient were reviewed by me and considered in my medical decision making (see chart for details).       Plan: 1.  Advised take Mucinex DM every 12 hours to help control chest congestion and cough. 2.  Advised take doxycycline 100 mg twice daily to treat the sinus and respiratory infection. 3.  A COVID and flu test are pending. 4.  Advised to follow-up PCP or return to urgent care if symptoms fail to improve. Final Clinical Impressions(s) / UC Diagnoses   Final diagnoses:  Viral upper respiratory tract infection  Bronchitis  Encounter for screening for COVID-19     Discharge Instructions      Advised to take Mucinex DM every 12 hours to help control cough and chest congestion. Advised take the doxycycline 100 mg twice daily until completed to treat the sinus and respiratory infection. Advised to use Tylenol or ibuprofen to help control fever aches and pains. A COVID and flu test results should be completed in 24 hours or less, if you do not get a call from this office that indicates the test is negative, you  can go on MyChart to view the results in 24 hours or less when it post. Advised to follow-up PCP or return to urgent care if symptoms fail to improve.     ED Prescriptions     Medication Sig Dispense Auth. Provider   dextromethorphan-guaiFENesin (MUCINEX DM) 30-600 MG 12hr tablet Take 1 tablet by mouth 2 (two) times daily. 20 tablet Nyoka Lint, PA-C   doxycycline (VIBRAMYCIN) 100 MG capsule Take 1 capsule (100 mg total) by mouth 2 (two) times daily. 20 capsule Nyoka Lint, PA-C      PDMP not reviewed this encounter.   Nyoka Lint, PA-C 07/17/22 1144

## 2022-07-17 NOTE — ED Triage Notes (Signed)
Called Patient's name twice. No response.

## 2022-07-17 NOTE — Discharge Instructions (Signed)
Advised to take Mucinex DM every 12 hours to help control cough and chest congestion. Advised take the doxycycline 100 mg twice daily until completed to treat the sinus and respiratory infection. Advised to use Tylenol or ibuprofen to help control fever aches and pains. A COVID and flu test results should be completed in 24 hours or less, if you do not get a call from this office that indicates the test is negative, you can go on MyChart to view the results in 24 hours or less when it post. Advised to follow-up PCP or return to urgent care if symptoms fail to improve.

## 2022-07-17 NOTE — ED Triage Notes (Signed)
Onset 3 days of cough,fever and body aches.

## 2022-07-24 ENCOUNTER — Emergency Department (HOSPITAL_COMMUNITY)
Admission: EM | Admit: 2022-07-24 | Discharge: 2022-07-24 | Disposition: A | Discharge status: Home / Self Care | Payer: BC Managed Care – PPO | Attending: Emergency Medicine | Admitting: Emergency Medicine

## 2022-07-24 ENCOUNTER — Emergency Department (HOSPITAL_BASED_OUTPATIENT_CLINIC_OR_DEPARTMENT_OTHER): Payer: BC Managed Care – PPO

## 2022-07-24 ENCOUNTER — Other Ambulatory Visit: Payer: Self-pay

## 2022-07-24 ENCOUNTER — Encounter (HOSPITAL_COMMUNITY): Payer: Self-pay

## 2022-07-24 DIAGNOSIS — Z7982 Long term (current) use of aspirin: Secondary | ICD-10-CM | POA: Insufficient documentation

## 2022-07-24 DIAGNOSIS — M79609 Pain in unspecified limb: Secondary | ICD-10-CM

## 2022-07-24 DIAGNOSIS — Z79899 Other long term (current) drug therapy: Secondary | ICD-10-CM | POA: Insufficient documentation

## 2022-07-24 DIAGNOSIS — M5432 Sciatica, left side: Secondary | ICD-10-CM | POA: Diagnosis not present

## 2022-07-24 DIAGNOSIS — M79605 Pain in left leg: Secondary | ICD-10-CM | POA: Insufficient documentation

## 2022-07-24 DIAGNOSIS — R52 Pain, unspecified: Secondary | ICD-10-CM

## 2022-07-24 DIAGNOSIS — M545 Low back pain, unspecified: Secondary | ICD-10-CM | POA: Diagnosis present

## 2022-07-24 MED ORDER — HYDROCODONE-ACETAMINOPHEN 5-325 MG PO TABS: 1.0000 | ORAL_TABLET | ORAL | 0 refills | Status: DC | PRN

## 2022-07-24 MED ORDER — HYDROCODONE-ACETAMINOPHEN 5-325 MG PO TABS
1.0000 | ORAL_TABLET | Freq: Once | ORAL | Status: AC
  Administered 2022-07-24: 1 via ORAL
  Filled 2022-07-24: qty 1

## 2022-07-24 MED ORDER — LIDOCAINE 5 % EX PTCH
1.0000 | MEDICATED_PATCH | CUTANEOUS | Status: DC
  Administered 2022-07-24: 1 via TRANSDERMAL
  Filled 2022-07-24: qty 1

## 2022-07-24 NOTE — Discharge Instructions (Addendum)
Continue with Baclofen, Tizanidine, Lyrica as prescribed. Short course of Norco provided today until you can check in with your care team on Monday to discuss further management.  Can also use over-the-counter lidocaine patches, similar to what was applied to your back while you were in the ER today.  Recommend warm compresses to your left lower back for 20 minutes at a time.  Recommend discussing further evaluation versus PT referral with your PCP.

## 2022-07-24 NOTE — ED Provider Triage Note (Signed)
Emergency Medicine Provider Triage Evaluation Note  Derek [name] , a 46 y.o. male  was evaluated in triage.  Pt complains of leg pain. Located in the left leg.  Started 2 days ago.  Pain is from the left knee to the left hip.  Pain is severe and sharp in not able to walk with a cane like he normally does.  Denies trauma, chest pain, shortness of breath.  That he was sitting on a bench top chair for too long and thinks that is what caused it.  Also mentioned that he had a stroke 2 years ago and has complete left-sided paralysis.  Takes aspirin daily.  Also smokes 10 cigarettes/day.  Review of Systems  Positive: See above Negative: See above  Physical Exam  BP (!) 156/114 (BP Location: Left Arm)   Pulse 82   Temp 98.6 F (37 C) (Oral)   Resp 16   Ht 5\' 11"  (1.803 m)   Wt 108 kg   SpO2 99%   BMI 33.19 kg/m  Gen:   Awake, no distress   Resp:  Normal effort  MSK:   Right extremities move without difficulty.  Unable to move left side of his body.  Sensation in his his toes on the left side are normal.  DP pulses 1+ bilaterally. Other:    Medical Decision Making  Medically screening exam initiated at 2:54 PM.  Appropriate orders placed.  Derek [name] was informed that the remainder of the evaluation will be completed by another provider, this initial triage assessment does not replace that evaluation, and the importance of remaining in the ED until their evaluation is complete.  Arterial/venous US of the left leg   Harriet Pho, PA-C 07/24/22 1501

## 2022-07-24 NOTE — ED Provider Notes (Signed)
[facility] EMERGENCY DEPARTMENT Provider Note   CSN: [id number] Arrival date & time: 07/24/22  1410     History  Chief Complaint  Patient presents with   Leg Pain    Left    Derek [name] is a 46 y.o. male.  46 year old male presents with complaint of pain radiating from left buttock [name] left leg, worse with trying to walk. History of CVA with left side deficits, walks with a cane at baseline. Denies recent falls or injuries. Pain started 2 days ago.  Denies abdominal pain, saddle paresthesia, loss of bowel or bladder control.       Home Medications Prior to Admission medications   Medication Sig Start Date End Date Taking? Authorizing Provider  HYDROcodone-acetaminophen (NORCO/VICODIN) 5-325 MG tablet Take 1 tablet by mouth every 4 (four) hours as needed. 07/24/22  Yes Tacy Learn, PA-C  aspirin 325 MG tablet 1 tablet    [provider]  atorvastatin (LIPITOR) 80 MG tablet 1 tablet    [provider]  baclofen (LIORESAL) 20 MG tablet Take 1 tablet by mouth 4 times daily 07/06/22   Meredith Staggers, MD  BINAXNOW COVID-19 AG HOME TEST KIT See admin instructions. 09/29/21   [provider]  dextromethorphan-guaiFENesin (MUCINEX DM) 30-600 MG 12hr tablet Take 1 tablet by mouth 2 (two) times daily. 07/17/22   Nyoka Lint, PA-C  doxycycline (VIBRAMYCIN) 100 MG capsule Take 1 capsule (100 mg total) by mouth 2 (two) times daily. 07/17/22   Nyoka Lint, PA-C  DULoxetine (CYMBALTA) 30 MG capsule 1 capsule 03/03/22   [provider]  folic acid (FOLVITE) 1 MG tablet Take 1 tablet by mouth once daily 08/10/21   Meredith Staggers, MD  metoprolol tartrate (LOPRESSOR) 25 MG tablet Take 1 tablet (25 mg total) by mouth 2 (two) times daily. 03/04/20 03/04/21  Meredith Staggers, MD  Multiple Vitamin (MULTIVITAMIN WITH MINERALS) TABS tablet Take 1 tablet by mouth daily. 01/16/20   Raiford Noble Latif, DO  pregabalin (LYRICA) 50 MG capsule TAKE  1 CAPSULE BY MOUTH THREE TIMES DAILY 03/17/22   Meredith Staggers, MD  tiZANidine (ZANAFLEX) 4 MG tablet Take 1 tablet by mouth 4 times daily 07/06/22   Meredith Staggers, MD      Allergies    Patient has no known allergies.    Review of Systems   Review of Systems Negative except as per HPI Physical Exam Updated Vital Signs BP (!) 146/102 (BP Location: Right Arm)   Pulse 76   Temp 98 F (36.7 C) (Oral)   Resp 18   Ht 5' 11"  (1.803 m)   Wt 108 kg   SpO2 96%   BMI 33.19 kg/m  Physical Exam Vitals and nursing note reviewed.  Constitutional:      General: He is not in acute distress.    Appearance: He is well-developed. He is not diaphoretic.  HENT:     Head: Normocephalic and atraumatic.  Cardiovascular:     Pulses: Normal pulses.  Pulmonary:     Effort: Pulmonary effort is normal.  Musculoskeletal:     Lumbar back: Positive right straight leg raise test. Negative left straight leg raise test.       Back:     Right lower leg: No edema.     Left lower leg: No edema.  Skin:    General: Skin is warm and dry.     Findings: No erythema or rash.  Neurological:  Mental Status: He is alert and oriented to person, place, and time.     Comments: Baseline left leg weakness  Psychiatric:        Behavior: Behavior normal.     ED Results / Procedures / Treatments   Labs (all labs ordered are listed, but only abnormal results are displayed) Labs Reviewed - No data to display  EKG None  Radiology VAS [place] ABI WITH/WO TBI  Result Date: 07/24/2022  LOWER EXTREMITY DOPPLER STUDY Patient Name:  Derek [name]  Date of Exam:   07/24/2022 Medical Rec #: [id number]         Accession #:    [id number] Date of Birth: 1976-09-13          Patient Gender: M Patient Age:   41 years Exam Location:  [facility] Procedure:      VAS [place] ABI WITH/WO TBI Referring Phys: Joanette Gula --------------------------------------------------------------------------------  Indications: Left knee  pain that radiates to hip. Discoloration of left foot. High Risk Factors: Hypertension, hyperlipidemia, current smoker, prior CVA. Other Factors: Marijuana and ETOH abuse.  Comparison Study: No prior study Performing Technologist: Sharion Dove RVS  Examination Guidelines: A complete evaluation includes at minimum, Doppler waveform signals and systolic blood pressure reading at the level of bilateral brachial, anterior tibial, and posterior tibial arteries, when vessel segments are accessible. Bilateral testing is considered an integral part of a complete examination. Photoelectric Plethysmograph (PPG) waveforms and toe systolic pressure readings are included as required and additional duplex testing as needed. Limited examinations for reoccurring indications may be performed as noted.  ABI Findings: +---------+------------------+-----+-----------+--------+ Right    Rt Pressure (mmHg)IndexWaveform   Comment  +---------+------------------+-----+-----------+--------+ Brachial 189                    triphasic           +---------+------------------+-----+-----------+--------+ PTA      206               1.09 multiphasic         +---------+------------------+-----+-----------+--------+ DP       199               1.05 multiphasic         +---------+------------------+-----+-----------+--------+ Great Toe182               0.96                     +---------+------------------+-----+-----------+--------+ +---------+------------------+-----+-----------+-------+ Left     Lt Pressure (mmHg)IndexWaveform   Comment +---------+------------------+-----+-----------+-------+ PTA      207               1.10 multiphasic        +---------+------------------+-----+-----------+-------+ DP       193               1.02 multiphasic        +---------+------------------+-----+-----------+-------+ Great Toe185               0.98                     +---------+------------------+-----+-----------+-------+ +-------+-----------+-----------+------------+------------+ ABI/TBIToday's ABIToday's TBIPrevious ABIPrevious TBI +-------+-----------+-----------+------------+------------+ Right  1.09       0.96                                +-------+-----------+-----------+------------+------------+ Left   1.10       0.98                                +-------+-----------+-----------+------------+------------+  Summary: Right: Resting right ankle-brachial index is within normal range. The right toe-brachial index is normal. Left: Resting left ankle-brachial index is within normal range. The left toe-brachial index is normal. *See table(s) above for measurements and observations.     Preliminary    VAS [place] LOWER EXTREMITY VENOUS (DVT) (7a-7p)  Result Date: 07/24/2022  Lower Venous DVT Study Patient Name:  Derek [name]  Date of Exam:   07/24/2022 Medical Rec #: [id number]         Accession #:    [id number] Date of Birth: [birth year]          Patient Gender: M Patient Age:   79 years Exam Location:  [facility] Procedure:      VAS [place] LOWER EXTREMITY VENOUS (DVT) Referring Phys: Joanette Gula --------------------------------------------------------------------------------  Indications: Pain, and discoloration of left foot.  Risk Factors: Residual left sided weakness status post CVA. Comparison Study: Prior negative bilateral LEV done 01/08/20 Performing Technologist: Sharion Dove RVS  Examination Guidelines: A complete evaluation includes B-mode imaging, spectral Doppler, color Doppler, and power Doppler as needed of all accessible portions of each vessel. Bilateral testing is considered an integral part of a complete examination. Limited examinations for reoccurring indications may be performed as noted. The reflux portion of the exam is performed with the patient in reverse Trendelenburg.   +-----+---------------+---------+-----------+----------+--------------+ RIGHTCompressibilityPhasicitySpontaneityPropertiesThrombus Aging +-----+---------------+---------+-----------+----------+--------------+ CFV  Full           Yes      Yes                                 +-----+---------------+---------+-----------+----------+--------------+   +---------+---------------+---------+-----------+----------+--------------+ LEFT     CompressibilityPhasicitySpontaneityPropertiesThrombus Aging +---------+---------------+---------+-----------+----------+--------------+ CFV      Full           Yes      Yes                                 +---------+---------------+---------+-----------+----------+--------------+ SFJ      Full                                                        +---------+---------------+---------+-----------+----------+--------------+ FV Prox  Full                                                        +---------+---------------+---------+-----------+----------+--------------+ FV Mid   Full                                                        +---------+---------------+---------+-----------+----------+--------------+ FV DistalFull                                                        +---------+---------------+---------+-----------+----------+--------------+ PFV  Full                                                        +---------+---------------+---------+-----------+----------+--------------+ POP      Full           Yes      Yes                                 +---------+---------------+---------+-----------+----------+--------------+ PTV      Full                                                        +---------+---------------+---------+-----------+----------+--------------+ PERO     Full                                                         +---------+---------------+---------+-----------+----------+--------------+     Summary: RIGHT: - No evidence of common femoral vein obstruction.  LEFT: - There is no evidence of deep vein thrombosis in the lower extremity.  - No cystic structure found in the popliteal fossa.  *See table(s) above for measurements and observations.    Preliminary     Procedures Procedures    Medications Ordered in ED Medications  lidocaine (LIDODERM) 5 % 1 patch (1 patch Transdermal Patch Applied 07/24/22 1729)  HYDROcodone-acetaminophen (NORCO/VICODIN) 5-325 MG per tablet 1 tablet (1 tablet Oral Given 07/24/22 1729)    ED Course/ Medical Decision Making/ A&P                           Medical Decision Making Risk Prescription drug management.   46 year old male presents with complaint of pain in his left lower back and buttocks which radiates [name] his left leg without fall or injury.  Pain is worse with ambulation, does report prior CVA with left-sided deficits and is ambulatory with a cane.  Patient is found have tenderness to his left lower back as well as left SI with straight leg raise of the right leg resulting in pain in his left side.  He reports his strength to be at baseline given his deficits in his left leg.  Vascular ultrasound as ordered in triage is negative for DVT in the left leg.  Suspect sciatica.  Patient is already on tizanidine, baclofen.  Will apply Lidoderm patch in the ER, advised he can continue with OTC equivalent.  Will provide short course of Norco, recommend warm compresses and gentle stretching with PCP follow-up to discuss further work-up versus PT referral.        Final Clinical Impression(s) / ED Diagnoses Final diagnoses:  Sciatica of left side    Rx / DC Orders ED Discharge Orders          Ordered    HYDROcodone-acetaminophen (NORCO/VICODIN) 5-325 MG tablet  Every 4 hours PRN        07/24/22 1715  Tacy Learn, PA-C 07/24/22 2204     Audley Hose, MD 07/24/22 [phone]

## 2022-07-24 NOTE — Progress Notes (Signed)
VASCULAR LAB    ABI has been performed.  See CV proc for preliminary results.   Messaged negative results to Suella Broad, PA-C via secure chat  Sharion Dove, RVT 07/24/2022, 4:38 PM

## 2022-07-24 NOTE — Progress Notes (Signed)
VASCULAR LAB    Left lower extremity venous duplex has been performed.  See CV proc for preliminary results.  Messaged negative results to Suella Broad, PA-C via secure chat  Sharion Dove, RVT 07/24/2022, 4:37 PM

## 2022-07-24 NOTE — ED Triage Notes (Signed)
Pt arrived POV w/ c/o inability to put pressure on L leg and unable to walk. Reports pain onset Thursday. Pt reports when he tries to stand, the pain starts in his L knee and radiates to his "butt bone", no injuries that he can think of. Pt w/ hx of CVA w/ residual L side weakness. Pt reports he has been walking with his cane up until Thursday when the pain happened.

## 2022-08-07 ENCOUNTER — Other Ambulatory Visit: Payer: Self-pay | Admitting: Physical Medicine & Rehabilitation

## 2022-08-07 DIAGNOSIS — G8110 Spastic hemiplegia affecting unspecified side: Secondary | ICD-10-CM

## 2022-08-07 DIAGNOSIS — I63311 Cerebral infarction due to thrombosis of right middle cerebral artery: Secondary | ICD-10-CM

## 2022-08-09 ENCOUNTER — Other Ambulatory Visit: Payer: Self-pay | Admitting: Physical Medicine & Rehabilitation

## 2022-08-09 MED ORDER — BACLOFEN 20 MG PO TABS: 20.0000 mg | ORAL_TABLET | Freq: Four times a day (QID) | ORAL | 0 refills | Status: DC

## 2022-08-09 MED ORDER — TIZANIDINE HCL 4 MG PO TABS: 4.0000 mg | ORAL_TABLET | Freq: Four times a day (QID) | ORAL | 0 refills | Status: DC

## 2022-08-10 ENCOUNTER — Telehealth: Payer: Self-pay

## 2022-08-10 NOTE — Telephone Encounter (Signed)
Pharmacist called to confirm if  Derek [name] suppose be taking Baclofen & Tizanidine? Please advise. Call back phone [phone].

## 2022-08-11 NOTE — Telephone Encounter (Signed)
Notified. 

## 2022-08-22 ENCOUNTER — Other Ambulatory Visit: Payer: Self-pay | Admitting: Physical Medicine & Rehabilitation

## 2022-08-22 DIAGNOSIS — I63511 Cerebral infarction due to unspecified occlusion or stenosis of right middle cerebral artery: Secondary | ICD-10-CM

## 2022-09-15 ENCOUNTER — Encounter: Payer: Self-pay | Admitting: Physical Medicine & Rehabilitation

## 2022-09-15 ENCOUNTER — Encounter
Payer: BC Managed Care – PPO | Attending: Physical Medicine & Rehabilitation | Admitting: Physical Medicine & Rehabilitation

## 2022-09-15 VITALS — BP 172/109 | HR 73 | Ht 71.0 in | Wt 228.0 lb

## 2022-09-15 DIAGNOSIS — G8110 Spastic hemiplegia affecting unspecified side: Secondary | ICD-10-CM | POA: Insufficient documentation

## 2022-09-15 DIAGNOSIS — I63511 Cerebral infarction due to unspecified occlusion or stenosis of right middle cerebral artery: Secondary | ICD-10-CM | POA: Diagnosis not present

## 2022-09-15 DIAGNOSIS — G894 Chronic pain syndrome: Secondary | ICD-10-CM | POA: Insufficient documentation

## 2022-09-15 MED ORDER — PREGABALIN 50 MG PO CAPS: 50.0000 mg | ORAL_CAPSULE | Freq: Three times a day (TID) | ORAL | 3 refills | Status: DC

## 2022-09-15 NOTE — Progress Notes (Signed)
Subjective:    Patient ID: Derek [name], male    DOB: 1976-04-24, 46 y.o.   MRN: [id number]  HPI  Derek [name] is here in follow up of his cva and spasitc left hemiparesis. We performed xeomin injections in August at his pecs and wrist/finger flexors. He had good results. He asked how long this thing was going to take (in reference to recovery). He wonders when his left hand function will return. His pain levels are ok. He receives hydrocodone from his primary. He isusing lyrica for nerve pain on the left side which seems to be helping. Additionally he's on cymbalta and tizanidine.  He is not wearing his AFO today. He walked in with his quad cane which he does fairly well with.    Pain Inventory Average Pain 8 Pain Right Now 0 My pain is intermittent and aching  LOCATION OF PAIN  shoulder, hip  BOWEL Number of stools per week: 7 Oral laxative use No  Type of laxative . Enema or suppository use No  History of colostomy No  Incontinent No   BLADDER Normal In and out cath, frequency . Able to self cath  . Bladder incontinence No  Frequent urination No  Leakage with coughing No  Difficulty starting stream No  Incomplete bladder emptying No    Mobility walk with assistance use a walker how many minutes can you walk? 5-10 ability to climb steps?  yes do you drive?  yes  Function disabled: date disabled . I need assistance with the following:  dressing, bathing, toileting, meal prep, household duties, and shopping  Neuro/Psych weakness numbness trouble walking spasms confusion depression anxiety  Prior Studies Any changes since last visit?  no  Physicians involved in your care Any changes since last visit?  no   Family History  Problem Relation Age of Onset   Stroke Neg Hx    CAD Neg Hx    Social History   Socioeconomic History   Marital status: Married    Spouse name: Not on file   Number of children: Not on file   Years of education: Not on file    Highest education level: Not on file  Occupational History   Occupation: truck driver  Tobacco Use   Smoking status: Every Day    Packs/day: 0.20    Types: Cigarettes   Smokeless tobacco: Never  Vaping Use   Vaping Use: Never used  Substance and Sexual Activity   Alcohol use: Yes    Comment: 2-3 times a week.    Drug use: Not Currently    Types: Marijuana   Sexual activity: Not Currently    Birth control/protection: Condom  Other Topics Concern   Not on file  Social History Narrative   Not on file   Social Determinants of Health   Financial Resource Strain: Not on file  Food Insecurity: Not on file  Transportation Needs: Not on file  Physical Activity: Not on file  Stress: Not on file  Social Connections: Not on file   Past Surgical History:  Procedure Laterality Date   BUBBLE STUDY  01/10/2020   Procedure: BUBBLE STUDY;  Surgeon: Sande Rives, MD;  Location: [facility] ENDOSCOPY;  Service: Cardiovascular;;   TEE WITHOUT CARDIOVERSION N/A 01/10/2020   Procedure: TRANSESOPHAGEAL ECHOCARDIOGRAM (TEE);  Surgeon: Sande Rives, MD;  Location: [facility] ENDOSCOPY;  Service: Cardiovascular;  Laterality: N/A;   Past Medical History:  Diagnosis Date   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/07/2020   Alcohol  dependence (HCC)    Back pain    Marijuana dependence (HCC)    Tobacco dependence    BP (!) 172/109   Pulse 73   Ht 5\' 11"  (1.803 m)   Wt 228 lb (103.4 kg)   SpO2 96%   BMI 31.80 kg/m   Opioid Risk Score:   Fall Risk Score:  `1  Depression screen PHQ 2/9     06/09/2022    1:22 PM 11/11/2021    1:08 PM 08/05/2021   10:32 AM 05/13/2021   10:32 AM 06/18/2020   11:23 AM 02/27/2020   10:45 AM  Depression screen PHQ 2/9  Decreased Interest 0 2 1 1  0 0  Down, Depressed, Hopeless 0 2 1 1  0 0  PHQ - 2 Score 0 4 2 2  0 0  Altered sleeping      0  Tired, decreased energy      1  Change in appetite      0  Feeling bad or failure about yourself       0  Trouble  concentrating      0  Moving slowly or fidgety/restless      0  Suicidal thoughts      0  PHQ-9 Score      1  Difficult doing work/chores      Somewhat difficult      Review of Systems  Musculoskeletal:        Shoulder and hip pain  Neurological:  Positive for weakness and numbness.  Psychiatric/Behavioral:  Positive for confusion and dysphoric mood. The patient is nervous/anxious.   All other systems reviewed and are negative.      Objective:   Physical Exam  General: No acute distress HEENT: NCAT, EOMI, oral membranes moist Cards: reg rate  Chest: normal effort Abdomen: Soft, NT, ND Skin: dry, intact Extremities: no edema Psych: pleasant and appropriate  Neuro: Left upper extremity remains 1 out of 5 with delotids and tr to 1 at biceps, 0/5 distal.  Left lower extremity is 3 out of 5 hip flexion 3 out of 5 knee extension 0 to trace at the ankle.  decreased LT LUE and LLE ongoing .  1out of 4 tone left pec and shoulder girdle musculature.  Biceps 1 out of 4. finger flexors and wrist flexors 2 out of 4.  Quads are 1/4 gastrocs 1 out of 4.  Reflexes are 3+..  no AFO today..   walks with left hip hike, circumduction and ER of LLE to help with clearance.            Assessment & Plan:  1.Right Middle Cerebral Artery Stroke:             - 2. Spastic Hemiparesis: Continue Baclofen              -tizanidine and baclofen.  Increase to 4 times daily             -Repeat xeomin 400 u to left wrist and finger flexors             -Discussed home exercise program .             -discussed recovery and prognosis today  -outpt therapies 3.History of Hypertension: PCP Following. Continue to Monitor. 4.Dyslipidemia: Lipitor refilled 5.left shoulder pain consistent with hemiplegic shoulder.  Continue Lyrica             --hydrocodone by primary     15 minutes of face to face  patient care time were spent during this visit. All questions were encouraged and answered.  Follow up with me in 2  months

## 2022-09-15 NOTE — Patient Instructions (Signed)
ALWAYS FEEL FREE TO CALL OUR OFFICE WITH ANY PROBLEMS OR QUESTIONS ([phone])  **PLEASE NOTE** ALL MEDICATION REFILL REQUESTS (INCLUDING CONTROLLED SUBSTANCES) NEED TO BE MADE AT LEAST 7 DAYS PRIOR TO REFILL BEING DUE. ANY REFILL REQUESTS INSIDE THAT TIME FRAME MAY RESULT IN DELAYS IN RECEIVING YOUR PRESCRIPTION.                    

## 2022-09-19 ENCOUNTER — Other Ambulatory Visit: Payer: Self-pay | Admitting: Physical Medicine & Rehabilitation

## 2022-09-19 DIAGNOSIS — I63311 Cerebral infarction due to thrombosis of right middle cerebral artery: Secondary | ICD-10-CM

## 2022-09-19 DIAGNOSIS — G8110 Spastic hemiplegia affecting unspecified side: Secondary | ICD-10-CM

## 2022-09-27 ENCOUNTER — Encounter: Payer: Self-pay | Admitting: Podiatry

## 2022-09-27 ENCOUNTER — Ambulatory Visit (INDEPENDENT_AMBULATORY_CARE_PROVIDER_SITE_OTHER): Payer: BC Managed Care – PPO | Admitting: Podiatry

## 2022-09-27 DIAGNOSIS — M79674 Pain in right toe(s): Secondary | ICD-10-CM

## 2022-09-27 DIAGNOSIS — I63311 Cerebral infarction due to thrombosis of right middle cerebral artery: Secondary | ICD-10-CM

## 2022-09-27 DIAGNOSIS — B351 Tinea unguium: Secondary | ICD-10-CM

## 2022-09-27 DIAGNOSIS — M79675 Pain in left toe(s): Secondary | ICD-10-CM

## 2022-09-27 DIAGNOSIS — M792 Neuralgia and neuritis, unspecified: Secondary | ICD-10-CM

## 2022-09-27 NOTE — Progress Notes (Signed)
This patient presents to the office for evaluation and treatment of long thick painful nails .  This patient is unable to trim his own nails since the patient cannot reach his feet.  Patient says the nails are painful walking and wearing his shoes.  Patient has history of CVA .   He returns for preventive foot care services.  General Appearance  Alert, conversant and in no acute stress.  Vascular  Dorsalis pedis and posterior tibial  pulses are palpable  bilaterally.  Capillary return is within normal limits  bilaterally. Temperature is within normal limits  bilaterally.  Neurologic  Senn-Weinstein monofilament wire test within normal limits  bilaterally. Muscle power within normal limits bilaterally.  Nails Thick disfigured discolored nails with subungual debris  from hallux to fifth toes bilaterally. No evidence of bacterial infection or drainage bilaterally.  Orthopedic  No limitations of motion  feet .  No crepitus or effusions noted.  No bony pathology or digital deformities noted.  Skin  normotropic skin with no porokeratosis noted bilaterally.  No signs of infections or ulcers noted.     Onychomycosis  Pain in toes right foot  Pain in toes left foot  Debridement  of nails  1-5  B/L with a nail nipper.  Nails were then filed using a dremel tool with no incidents.    RTC  10 weeks    Helane Gunther DPM

## 2022-09-28 DIAGNOSIS — M25562 Pain in left knee: Secondary | ICD-10-CM | POA: Diagnosis not present

## 2022-09-28 DIAGNOSIS — M6281 Muscle weakness (generalized): Secondary | ICD-10-CM | POA: Diagnosis not present

## 2022-09-28 DIAGNOSIS — M5451 Vertebrogenic low back pain: Secondary | ICD-10-CM | POA: Diagnosis not present

## 2022-10-01 DIAGNOSIS — M5451 Vertebrogenic low back pain: Secondary | ICD-10-CM | POA: Diagnosis not present

## 2022-10-01 DIAGNOSIS — M6281 Muscle weakness (generalized): Secondary | ICD-10-CM | POA: Diagnosis not present

## 2022-10-01 DIAGNOSIS — M25562 Pain in left knee: Secondary | ICD-10-CM | POA: Diagnosis not present

## 2022-10-05 ENCOUNTER — Other Ambulatory Visit: Payer: Self-pay | Admitting: Physical Medicine & Rehabilitation

## 2022-10-05 DIAGNOSIS — I63311 Cerebral infarction due to thrombosis of right middle cerebral artery: Secondary | ICD-10-CM

## 2022-10-05 DIAGNOSIS — G8110 Spastic hemiplegia affecting unspecified side: Secondary | ICD-10-CM

## 2022-10-06 DIAGNOSIS — M5451 Vertebrogenic low back pain: Secondary | ICD-10-CM | POA: Diagnosis not present

## 2022-10-06 DIAGNOSIS — M6281 Muscle weakness (generalized): Secondary | ICD-10-CM | POA: Diagnosis not present

## 2022-10-06 DIAGNOSIS — M25562 Pain in left knee: Secondary | ICD-10-CM | POA: Diagnosis not present

## 2022-10-08 ENCOUNTER — Other Ambulatory Visit: Payer: Self-pay | Admitting: Physical Medicine & Rehabilitation

## 2022-10-08 DIAGNOSIS — I63311 Cerebral infarction due to thrombosis of right middle cerebral artery: Secondary | ICD-10-CM

## 2022-10-08 DIAGNOSIS — G8110 Spastic hemiplegia affecting unspecified side: Secondary | ICD-10-CM

## 2022-10-08 MED ORDER — TIZANIDINE HCL 4 MG PO TABS: 4.0000 mg | ORAL_TABLET | Freq: Four times a day (QID) | ORAL | 0 refills | Status: DC

## 2022-10-20 ENCOUNTER — Encounter: Payer: Medicare HMO | Admitting: Physical Medicine & Rehabilitation

## 2022-10-29 ENCOUNTER — Other Ambulatory Visit: Payer: Self-pay | Admitting: Physical Medicine & Rehabilitation

## 2022-10-29 DIAGNOSIS — I63311 Cerebral infarction due to thrombosis of right middle cerebral artery: Secondary | ICD-10-CM

## 2022-10-29 DIAGNOSIS — G8110 Spastic hemiplegia affecting unspecified side: Secondary | ICD-10-CM

## 2022-11-03 DIAGNOSIS — K635 Polyp of colon: Secondary | ICD-10-CM | POA: Diagnosis not present

## 2022-11-03 DIAGNOSIS — Z1211 Encounter for screening for malignant neoplasm of colon: Secondary | ICD-10-CM | POA: Diagnosis not present

## 2022-11-08 DIAGNOSIS — M6281 Muscle weakness (generalized): Secondary | ICD-10-CM | POA: Diagnosis not present

## 2022-11-08 DIAGNOSIS — M25562 Pain in left knee: Secondary | ICD-10-CM | POA: Diagnosis not present

## 2022-11-08 DIAGNOSIS — K635 Polyp of colon: Secondary | ICD-10-CM | POA: Diagnosis not present

## 2022-11-08 DIAGNOSIS — M5451 Vertebrogenic low back pain: Secondary | ICD-10-CM | POA: Diagnosis not present

## 2022-11-10 DIAGNOSIS — M5451 Vertebrogenic low back pain: Secondary | ICD-10-CM | POA: Diagnosis not present

## 2022-11-10 DIAGNOSIS — M6281 Muscle weakness (generalized): Secondary | ICD-10-CM | POA: Diagnosis not present

## 2022-11-10 DIAGNOSIS — M25562 Pain in left knee: Secondary | ICD-10-CM | POA: Diagnosis not present

## 2022-11-15 DIAGNOSIS — M5451 Vertebrogenic low back pain: Secondary | ICD-10-CM | POA: Diagnosis not present

## 2022-11-15 DIAGNOSIS — M6281 Muscle weakness (generalized): Secondary | ICD-10-CM | POA: Diagnosis not present

## 2022-11-15 DIAGNOSIS — M25562 Pain in left knee: Secondary | ICD-10-CM | POA: Diagnosis not present

## 2022-11-22 DIAGNOSIS — M5451 Vertebrogenic low back pain: Secondary | ICD-10-CM | POA: Diagnosis not present

## 2022-11-22 DIAGNOSIS — M25562 Pain in left knee: Secondary | ICD-10-CM | POA: Diagnosis not present

## 2022-11-22 DIAGNOSIS — M6281 Muscle weakness (generalized): Secondary | ICD-10-CM | POA: Diagnosis not present

## 2022-11-24 ENCOUNTER — Encounter: Payer: Medicare HMO | Admitting: Physical Medicine & Rehabilitation

## 2022-11-24 DIAGNOSIS — M5451 Vertebrogenic low back pain: Secondary | ICD-10-CM | POA: Diagnosis not present

## 2022-11-24 DIAGNOSIS — M25562 Pain in left knee: Secondary | ICD-10-CM | POA: Diagnosis not present

## 2022-11-24 DIAGNOSIS — M6281 Muscle weakness (generalized): Secondary | ICD-10-CM | POA: Diagnosis not present

## 2022-11-29 DIAGNOSIS — M5451 Vertebrogenic low back pain: Secondary | ICD-10-CM | POA: Diagnosis not present

## 2022-11-29 DIAGNOSIS — M25562 Pain in left knee: Secondary | ICD-10-CM | POA: Diagnosis not present

## 2022-11-29 DIAGNOSIS — M6281 Muscle weakness (generalized): Secondary | ICD-10-CM | POA: Diagnosis not present

## 2022-12-01 DIAGNOSIS — M5451 Vertebrogenic low back pain: Secondary | ICD-10-CM | POA: Diagnosis not present

## 2022-12-01 DIAGNOSIS — M25562 Pain in left knee: Secondary | ICD-10-CM | POA: Diagnosis not present

## 2022-12-01 DIAGNOSIS — M6281 Muscle weakness (generalized): Secondary | ICD-10-CM | POA: Diagnosis not present

## 2022-12-07 DIAGNOSIS — M25562 Pain in left knee: Secondary | ICD-10-CM | POA: Diagnosis not present

## 2022-12-07 DIAGNOSIS — M5451 Vertebrogenic low back pain: Secondary | ICD-10-CM | POA: Diagnosis not present

## 2022-12-07 DIAGNOSIS — M6281 Muscle weakness (generalized): Secondary | ICD-10-CM | POA: Diagnosis not present

## 2022-12-08 ENCOUNTER — Other Ambulatory Visit: Payer: Self-pay | Admitting: Physical Medicine & Rehabilitation

## 2022-12-08 DIAGNOSIS — I63511 Cerebral infarction due to unspecified occlusion or stenosis of right middle cerebral artery: Secondary | ICD-10-CM

## 2022-12-10 DIAGNOSIS — M5451 Vertebrogenic low back pain: Secondary | ICD-10-CM | POA: Diagnosis not present

## 2022-12-10 DIAGNOSIS — M6281 Muscle weakness (generalized): Secondary | ICD-10-CM | POA: Diagnosis not present

## 2022-12-10 DIAGNOSIS — M25562 Pain in left knee: Secondary | ICD-10-CM | POA: Diagnosis not present

## 2022-12-13 DIAGNOSIS — M5451 Vertebrogenic low back pain: Secondary | ICD-10-CM | POA: Diagnosis not present

## 2022-12-13 DIAGNOSIS — M25562 Pain in left knee: Secondary | ICD-10-CM | POA: Diagnosis not present

## 2022-12-13 DIAGNOSIS — M6281 Muscle weakness (generalized): Secondary | ICD-10-CM | POA: Diagnosis not present

## 2022-12-15 ENCOUNTER — Telehealth: Payer: Self-pay | Admitting: *Deleted

## 2022-12-15 ENCOUNTER — Telehealth: Payer: Self-pay

## 2022-12-15 DIAGNOSIS — I63511 Cerebral infarction due to unspecified occlusion or stenosis of right middle cerebral artery: Secondary | ICD-10-CM

## 2022-12-15 MED ORDER — BACLOFEN 20 MG PO TABS: 20.0000 mg | ORAL_TABLET | Freq: Four times a day (QID) | ORAL | 2 refills | Status: DC

## 2022-12-15 NOTE — Telephone Encounter (Signed)
Refill request for Baclofen. Refill sent in

## 2022-12-15 NOTE — Telephone Encounter (Signed)
Refill request for Folic Acid. No mention in last note. Is this ok to refill?

## 2022-12-16 MED ORDER — FOLIC ACID 1 MG PO TABS: 1.0000 mg | ORAL_TABLET | Freq: Every day | ORAL | 1 refills | Status: DC

## 2022-12-20 MED ORDER — FOLIC ACID 1 MG PO TABS: 1.0000 mg | ORAL_TABLET | Freq: Every day | ORAL | 1 refills | Status: DC

## 2022-12-20 NOTE — Addendum Note (Signed)
Addended by: Jasmine December T on: 12/20/2022 04:04 PM   Modules accepted: Orders

## 2022-12-22 DIAGNOSIS — M5451 Vertebrogenic low back pain: Secondary | ICD-10-CM | POA: Diagnosis not present

## 2022-12-22 DIAGNOSIS — M25562 Pain in left knee: Secondary | ICD-10-CM | POA: Diagnosis not present

## 2022-12-22 DIAGNOSIS — M6281 Muscle weakness (generalized): Secondary | ICD-10-CM | POA: Diagnosis not present

## 2022-12-28 ENCOUNTER — Ambulatory Visit (INDEPENDENT_AMBULATORY_CARE_PROVIDER_SITE_OTHER): Payer: Medicare HMO | Admitting: Podiatry

## 2022-12-28 ENCOUNTER — Encounter: Payer: Self-pay | Admitting: Podiatry

## 2022-12-28 DIAGNOSIS — M79674 Pain in right toe(s): Secondary | ICD-10-CM | POA: Diagnosis not present

## 2022-12-28 DIAGNOSIS — M79675 Pain in left toe(s): Secondary | ICD-10-CM | POA: Diagnosis not present

## 2022-12-28 DIAGNOSIS — B351 Tinea unguium: Secondary | ICD-10-CM

## 2022-12-28 DIAGNOSIS — I63311 Cerebral infarction due to thrombosis of right middle cerebral artery: Secondary | ICD-10-CM | POA: Diagnosis not present

## 2022-12-28 DIAGNOSIS — M792 Neuralgia and neuritis, unspecified: Secondary | ICD-10-CM

## 2022-12-28 NOTE — Progress Notes (Signed)
This patient presents to the office for evaluation and treatment of long thick painful nails .  This patient is unable to trim his own nails since the patient cannot reach his feet.  Patient says the nails are painful walking and wearing his shoes.  Patient has history of CVA .   He returns for preventive foot care services.  General Appearance  Alert, conversant and in no acute stress.  Vascular  Dorsalis pedis and posterior tibial  pulses are palpable  bilaterally.  Capillary return is within normal limits  bilaterally. Temperature is within normal limits  bilaterally.  Neurologic  Senn-Weinstein monofilament wire test within normal limits  bilaterally. Muscle power within normal limits bilaterally.  Nails Thick disfigured discolored nails with subungual debris  from hallux to fifth toes bilaterally. No evidence of bacterial infection or drainage bilaterally.  Orthopedic  No limitations of motion  feet .  No crepitus or effusions noted.  No bony pathology or digital deformities noted.  Skin  normotropic skin with no porokeratosis noted bilaterally.  No signs of infections or ulcers noted.     Onychomycosis  Pain in toes right foot  Pain in toes left foot  Debridement  of nails  1-5  B/L with a nail nipper.  Nails were then filed using a dremel tool with no incidents.    RTC  12   weeks    Gardiner Barefoot DPM

## 2022-12-29 DIAGNOSIS — M25562 Pain in left knee: Secondary | ICD-10-CM | POA: Diagnosis not present

## 2022-12-29 DIAGNOSIS — M5451 Vertebrogenic low back pain: Secondary | ICD-10-CM | POA: Diagnosis not present

## 2022-12-29 DIAGNOSIS — M6281 Muscle weakness (generalized): Secondary | ICD-10-CM | POA: Diagnosis not present

## 2023-01-03 DIAGNOSIS — M6281 Muscle weakness (generalized): Secondary | ICD-10-CM | POA: Diagnosis not present

## 2023-01-03 DIAGNOSIS — M25562 Pain in left knee: Secondary | ICD-10-CM | POA: Diagnosis not present

## 2023-01-03 DIAGNOSIS — M5451 Vertebrogenic low back pain: Secondary | ICD-10-CM | POA: Diagnosis not present

## 2023-01-05 DIAGNOSIS — M5451 Vertebrogenic low back pain: Secondary | ICD-10-CM | POA: Diagnosis not present

## 2023-01-05 DIAGNOSIS — M6281 Muscle weakness (generalized): Secondary | ICD-10-CM | POA: Diagnosis not present

## 2023-01-05 DIAGNOSIS — M25562 Pain in left knee: Secondary | ICD-10-CM | POA: Diagnosis not present

## 2023-01-10 DIAGNOSIS — M25562 Pain in left knee: Secondary | ICD-10-CM | POA: Diagnosis not present

## 2023-01-10 DIAGNOSIS — M6281 Muscle weakness (generalized): Secondary | ICD-10-CM | POA: Diagnosis not present

## 2023-01-10 DIAGNOSIS — M5451 Vertebrogenic low back pain: Secondary | ICD-10-CM | POA: Diagnosis not present

## 2023-01-12 DIAGNOSIS — M6281 Muscle weakness (generalized): Secondary | ICD-10-CM | POA: Diagnosis not present

## 2023-01-12 DIAGNOSIS — M5451 Vertebrogenic low back pain: Secondary | ICD-10-CM | POA: Diagnosis not present

## 2023-01-12 DIAGNOSIS — M25562 Pain in left knee: Secondary | ICD-10-CM | POA: Diagnosis not present

## 2023-01-16 ENCOUNTER — Other Ambulatory Visit: Payer: Self-pay | Admitting: Physical Medicine & Rehabilitation

## 2023-01-17 DIAGNOSIS — M25562 Pain in left knee: Secondary | ICD-10-CM | POA: Diagnosis not present

## 2023-01-17 DIAGNOSIS — M6281 Muscle weakness (generalized): Secondary | ICD-10-CM | POA: Diagnosis not present

## 2023-01-17 DIAGNOSIS — M5451 Vertebrogenic low back pain: Secondary | ICD-10-CM | POA: Diagnosis not present

## 2023-01-19 DIAGNOSIS — M6281 Muscle weakness (generalized): Secondary | ICD-10-CM | POA: Diagnosis not present

## 2023-01-19 DIAGNOSIS — M25562 Pain in left knee: Secondary | ICD-10-CM | POA: Diagnosis not present

## 2023-01-19 DIAGNOSIS — M5451 Vertebrogenic low back pain: Secondary | ICD-10-CM | POA: Diagnosis not present

## 2023-01-27 DIAGNOSIS — M5451 Vertebrogenic low back pain: Secondary | ICD-10-CM | POA: Diagnosis not present

## 2023-01-27 DIAGNOSIS — M25562 Pain in left knee: Secondary | ICD-10-CM | POA: Diagnosis not present

## 2023-01-27 DIAGNOSIS — M6281 Muscle weakness (generalized): Secondary | ICD-10-CM | POA: Diagnosis not present

## 2023-02-03 ENCOUNTER — Other Ambulatory Visit: Payer: Self-pay | Admitting: Physical Medicine & Rehabilitation

## 2023-02-09 ENCOUNTER — Encounter: Payer: Self-pay | Admitting: Physical Medicine & Rehabilitation

## 2023-02-09 ENCOUNTER — Encounter: Payer: Medicare HMO | Attending: Physical Medicine & Rehabilitation | Admitting: Physical Medicine & Rehabilitation

## 2023-02-09 VITALS — BP 136/93 | HR 80 | Ht 71.0 in | Wt 219.0 lb

## 2023-02-09 DIAGNOSIS — G8110 Spastic hemiplegia affecting unspecified side: Secondary | ICD-10-CM | POA: Diagnosis not present

## 2023-02-09 MED ORDER — INCOBOTULINUMTOXINA 100 UNITS IM SOLR
400.0000 [IU] | Freq: Once | INTRAMUSCULAR | Status: AC
  Administered 2023-02-09: 400 [IU] via INTRAMUSCULAR

## 2023-02-09 NOTE — Progress Notes (Signed)
Xeomin Injection for spasticity of upper extremity using needle EMG guidance Indication: Spastic hemiparesis - Plan: Ambulatory referral to Physical Therapy, Ambulatory referral to Occupational Therapy  G81.10 Dilution: 100 Units/ml        Total Units Injected: 400 Indication: Severe spasticity which interferes with ADL,mobility and/or  hygiene and is unresponsive to medication management and other conservative care Informed consent was obtained after describing risks and benefits of the procedure with the patient. This includes bleeding, bruising, infection, excessive weakness, or medication side effects. A REMS form is on file and signed.  Needle: 45mm injectable monopolar needle electrode    Number of units per muscle Pectoralis Major 100 units Pectoralis Minor 100 units Biceps 0 units Brachioradialis 0 units FCR 25 units FCU 25 units FDS 75 units FDP 75 units FPL 0 units Palmaris Longus 0 units Pronator Teres 0 units Pronator Quadratus 0 units Lumbricals 0 units All injections were done after obtaining appropriate EMG activity and after negative drawback for blood. The patient tolerated the procedure well. Post procedure instructions were given. Return in about 3 months (around 05/11/2023).

## 2023-02-09 NOTE — Patient Instructions (Signed)
ALWAYS FEEL FREE TO CALL OUR OFFICE WITH ANY PROBLEMS OR QUESTIONS ([phone])  **PLEASE NOTE** ALL MEDICATION REFILL REQUESTS (INCLUDING CONTROLLED SUBSTANCES) NEED TO BE MADE AT LEAST 7 DAYS PRIOR TO REFILL BEING DUE. ANY REFILL REQUESTS INSIDE THAT TIME FRAME MAY RESULT IN DELAYS IN RECEIVING YOUR PRESCRIPTION.                    

## 2023-02-10 ENCOUNTER — Telehealth: Payer: Self-pay | Admitting: Physical Medicine & Rehabilitation

## 2023-02-10 NOTE — Telephone Encounter (Signed)
The referral coordinator called from Dr. Malena Peer office. She said she's been working on trying to get him a wheel chair ramp since Feb 2022. She wanted to know if Dr. Riley Kill could help?

## 2023-02-14 NOTE — Telephone Encounter (Signed)
Left message for referral coordinator to see if an order needs to be created or a letter.

## 2023-02-22 ENCOUNTER — Ambulatory Visit: Payer: Medicare HMO | Attending: Physical Medicine & Rehabilitation

## 2023-02-22 VITALS — BP 153/110

## 2023-02-22 DIAGNOSIS — M6281 Muscle weakness (generalized): Secondary | ICD-10-CM | POA: Insufficient documentation

## 2023-02-22 DIAGNOSIS — R293 Abnormal posture: Secondary | ICD-10-CM | POA: Insufficient documentation

## 2023-02-22 DIAGNOSIS — G8110 Spastic hemiplegia affecting unspecified side: Secondary | ICD-10-CM | POA: Diagnosis not present

## 2023-02-22 DIAGNOSIS — R2681 Unsteadiness on feet: Secondary | ICD-10-CM | POA: Insufficient documentation

## 2023-02-22 DIAGNOSIS — R2689 Other abnormalities of gait and mobility: Secondary | ICD-10-CM | POA: Diagnosis not present

## 2023-02-22 NOTE — Therapy (Signed)
OUTPATIENT PHYSICAL THERAPY NEURO EVALUATION   Patient Name: Derek [name] MRN: [id number] DOB:December 29, 1975, 47 y.o., male Today's Date: 02/22/2023   PCP: Aliene Beams, MD REFERRING PROVIDER: Ranelle Oyster, MD  END OF SESSION:  PT End of Session - 02/22/23 1452     Visit Number 1    Number of Visits 2    Date for PT Re-Evaluation 03/11/23   to allow for patient to get BP rx   Authorization Type [employer] medicare    PT Start Time 1448    PT Stop Time 1522   BP elevated   PT Time Calculation (min) 34 min    Activity Tolerance Treatment limited secondary to medical complications (Comment)    Behavior During Therapy [facility] for tasks assessed/performed;Flat affect             Past Medical History:  Diagnosis Date   Acute ischemic right MCA stroke 01/07/2020   Alcohol dependence    Back pain    Marijuana dependence    Tobacco dependence    Past Surgical History:  Procedure Laterality Date   BUBBLE STUDY  01/10/2020   Procedure: BUBBLE STUDY;  Surgeon: Sande Rives, MD;  Location: [facility] ENDOSCOPY;  Service: Cardiovascular;;   TEE WITHOUT CARDIOVERSION N/A 01/10/2020   Procedure: TRANSESOPHAGEAL ECHOCARDIOGRAM (TEE);  Surgeon: Sande Rives, MD;  Location: [facility] ENDOSCOPY;  Service: Cardiovascular;  Laterality: N/A;   Patient Active Problem List   Diagnosis Date Noted   Moderate major depression, single episode 04/14/2022   Arteriosclerosis of carotid artery 02/01/2022   Essential hypertension 02/01/2022   Hemiplegia of nondominant 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 02/01/2022   History of substance abuse 02/01/2022   Iron deficiency anemia 02/01/2022   Late effects of cerebrovascular disease 02/01/2022   Obesity 02/01/2022   Pure hypercholesterolemia 02/01/2022   Pain due to onychomycosis of toenails of both feet 06/10/2021   Neuropathic pain 04/09/2020   Pain    Spastic hemiparesis    History of hypertension    Dyslipidemia    Right middle  cerebral artery stroke 01/15/2020   Leukocytosis 01/12/2020   Cerebrovascular accident (CVA) due to thrombosis of right middle cerebral artery    Chronic pain syndrome    Hypokalemia    Dysphagia, post-stroke    Acute ischemic right MCA stroke 01/07/2020   Tobacco dependence    Marijuana dependence    Alcohol dependence     ONSET DATE: 02/09/2023 referral  REFERRING DIAG: G81.10 (ICD-10-CM) - Spastic hemiparesis  THERAPY DIAG:  Muscle weakness (generalized)  Other abnormalities of gait and mobility  Unsteadiness on feet  Abnormal posture  Rationale for Evaluation and Treatment: Rehabilitation  SUBJECTIVE:  SUBJECTIVE STATEMENT: Patient arrives to clinic alone, using [facility]. Patient voicing frustration that he "hasn't had enough therapy" and that his "left side still doesn't work." Does have apparent L spastic hemiparesis, primarily in L UE. Reports a friend dropped him off at the clinic- doesn't drive. Patient reports previously having a "brace," but describes possibly an ASO(?) not an AFO. States he "didn't know he was supposed to keep wearing it." PT unsure which type of bracing it is.  Pt accompanied by: self  PERTINENT HISTORY: R MCA CVA, Alcohol Dependence, Marijuana/Tobacco Dependence, Back Pain   PAIN:  Are you having pain? Yes: NPRS scale: 4/10 Pain location: L shoulder, L low back Pain description: aching Aggravating factors: laying on L side, sitting too long Relieving factors: pain rx (is currently out of it)  Today's Vitals   02/22/23 1515 02/22/23 1518  BP: (!) 138/105 (!) 153/110   There is no height or weight on file to calculate BMI.   PRECAUTIONS: Fall and Other: L spastic hemi  WEIGHT BEARING RESTRICTIONS: No  FALLS: Has patient fallen in last 6 months?  No  LIVING ENVIRONMENT: Lives with: lives with their spouse Lives in: House/apartment Stairs: Yes: External: 8 steps; can reach both Has following equipment at home: Quad cane large base, Hemi walker, Wheelchair (manual), shower chair, and Grab bars  PLOF: Needs assistance with ADLs and Needs assistance with homemaking  PATIENT GOALS: "to get back to at least 90% of normalcy" ("be able to use my left side")  OBJECTIVE:   DIAGNOSTIC FINDINGS: 01/09/20 head CT: IMPRESSION: 1. Evolving right cerebral infarct with evidence for hemorrhagic transformation at the right basal ganglia, similar in appearance and distribution as compared to previous MRI. Associated mass effect with 3 mm of right-to-left shift. No hydrocephalus or ventricular trapping. 2. No other new acute intracranial abnormality.  COGNITION: Overall cognitive status: Within functional limits for tasks assessed   SENSATION: WFL  COORDINATION: Unable to complete heel shin and figure 8 L LE Unable to complete RAM L LE   MUSCLE TONE: LLE: Hypertonic L hip abductor, quad tone incr  3 beats Clonus L LE     POSTURE: rounded shoulders, forward head, increased thoracic kyphosis, posterior pelvic tilt, left pelvic obliquity, and weight shift right  LOWER EXTREMITY MMT:    MMT Right Eval Left Eval  Hip flexion 5 2-  Hip extension    Hip abduction 5 3  Hip adduction 5 3  Hip internal rotation    Hip external rotation    Knee flexion 4 hypertonic  Knee extension 5 2+  Ankle dorsiflexion 5 0  Ankle plantarflexion    Ankle inversion    Ankle eversion    (Blank rows = not tested)  BED MOBILITY:  Independent per patient report   TRANSFERS: Assistive device utilized: Quad cane large base  Sit to stand: SBA Stand to sit: SBA Chair to chair: SBA   GAIT: Gait pattern: step to pattern, decreased arm swing- Left, decreased step length- Right, decreased stance time- Left, decreased hip/knee flexion- Left,  decreased ankle dorsiflexion- Left, circumduction- Left, Left hip hike, lateral lean- Right, trunk rotated posterior- Left, and poor foot clearance- Left Distance walked: clinic Assistive device utilized: Quad cane large base Level of assistance: SBA   TODAY'S TREATMENT:  N/a eval   PATIENT EDUCATION: Education details: PT POC, exam findings, neural plasticity, appropriate BP parameters Person educated: Patient Education method: Explanation Education comprehension: verbalized understanding and needs further education  HOME EXERCISE PROGRAM: To be provided  GOALS: Goals reviewed with patient? Yes  SHORT TERM GOALS: = LTG based on PT POC  LONG TERM GOALS: Target date: 03/11/23  Patient will be compliant with BP rx as medical provider prescribed to allow for completion of evaluation Baseline: non-compliant Goal status: INITIAL   ASSESSMENT:  CLINICAL IMPRESSION: Patient is a 47 y.o. male who was seen today for physical therapy evaluation and treatment for L spastic hemiparesis due to chronic CVA. Patient reports currently being out of BP rx and thus has not taken it (though previously reported being fully compliant with rx at onset of eval). Thus, BP was outside therapeutic range to complete functional outcome measures at todays visit. Patient does present with spastic gait pattern with hypertonicity noted in L UE> LE. Extensive education on therapeutic BP range, smoking cessation to support lower BP and decreased risk for additional CVA. Patient to obtain refill of rx to allow for completion of PT eval. He would benefit from skilled PT services to address spasticity/hypertonicity management and gait retraining once BP is within appropriate range.    OBJECTIVE IMPAIRMENTS: Abnormal gait, decreased activity tolerance, decreased balance, decreased coordination,  decreased endurance, decreased knowledge of condition, decreased knowledge of use of DME, difficulty walking, decreased strength, impaired perceived functional ability, impaired tone, impaired UE functional use, postural dysfunction, and pain.   ACTIVITY LIMITATIONS: carrying, lifting, bending, standing, squatting, stairs, bathing, dressing, hygiene/grooming, locomotion level, and caring for others  PARTICIPATION LIMITATIONS: meal prep, cleaning, interpersonal relationship, driving, shopping, community activity, and occupation  PERSONAL FACTORS: Behavior pattern, Fitness, Past/current experiences, Time since onset of injury/illness/exacerbation, Transportation, and 3+ comorbidities: see above  are also affecting patient's functional outcome.   REHAB POTENTIAL: Fair time since onset  CLINICAL DECISION MAKING: Evolving/moderate complexity  EVALUATION COMPLEXITY: Moderate  PLAN:  PT FREQUENCY: 1x/week  PT DURATION: 2 weeks  PLANNED INTERVENTIONS: Therapeutic exercises, Therapeutic activity, Neuromuscular re-education, Balance training, Gait training, Patient/Family education, Self Care, Joint mobilization, Stair training, Vestibular training, Visual/preceptual remediation/compensation, Orthotic/Fit training, DME instructions, Aquatic Therapy, Electrical stimulation, Taping, Manual therapy, and Re-evaluation  PLAN FOR NEXT SESSION: CHECK BP, complete appropriate outcome measures and re-cert for full POC as appropriate    Westley Foots, PT, DPT, CBIS 02/22/2023, 3:31 PM

## 2023-03-02 ENCOUNTER — Ambulatory Visit: Payer: Medicare HMO | Attending: Physical Medicine & Rehabilitation

## 2023-03-02 VITALS — BP 143/98

## 2023-03-02 DIAGNOSIS — R2689 Other abnormalities of gait and mobility: Secondary | ICD-10-CM | POA: Diagnosis not present

## 2023-03-02 DIAGNOSIS — R208 Other disturbances of skin sensation: Secondary | ICD-10-CM | POA: Insufficient documentation

## 2023-03-02 DIAGNOSIS — R2681 Unsteadiness on feet: Secondary | ICD-10-CM | POA: Diagnosis present

## 2023-03-02 DIAGNOSIS — G8110 Spastic hemiplegia affecting unspecified side: Secondary | ICD-10-CM | POA: Diagnosis present

## 2023-03-02 DIAGNOSIS — M79602 Pain in left arm: Secondary | ICD-10-CM | POA: Insufficient documentation

## 2023-03-02 DIAGNOSIS — R293 Abnormal posture: Secondary | ICD-10-CM

## 2023-03-02 DIAGNOSIS — M6281 Muscle weakness (generalized): Secondary | ICD-10-CM

## 2023-03-02 NOTE — Therapy (Signed)
OUTPATIENT PHYSICAL THERAPY NEURO TREATMENT/RE-CERT   Patient Name: Derek [name] MRN: [id number] DOB:05-28-1976, 47 y.o., male Today's Date: 03/02/2023   PCP: Aliene Beams, MD REFERRING PROVIDER: Ranelle Oyster, MD  END OF SESSION:  PT End of Session - 03/02/23 1451     Visit Number 2    Number of Visits 10    Date for PT Re-Evaluation 04/01/23    Authorization Type humana medicare    PT Start Time 1445    PT Stop Time 1530    PT Time Calculation (min) 45 min    Equipment Utilized During Treatment Gait belt    Activity Tolerance Patient tolerated treatment well    Behavior During Therapy Flat affect             Past Medical History:  Diagnosis Date   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/07/2020   Alcohol dependence (HCC)    Back pain    Marijuana dependence (HCC)    Tobacco dependence    Past Surgical History:  Procedure Laterality Date   BUBBLE STUDY  01/10/2020   Procedure: BUBBLE STUDY;  Surgeon: Sande Rives, MD;  Location: [facility] ENDOSCOPY;  Service: Cardiovascular;;   TEE WITHOUT CARDIOVERSION N/A 01/10/2020   Procedure: TRANSESOPHAGEAL ECHOCARDIOGRAM (TEE);  Surgeon: Sande Rives, MD;  Location: [facility] ENDOSCOPY;  Service: Cardiovascular;  Laterality: N/A;   Patient Active Problem List   Diagnosis Date Noted   Moderate major depression, single episode (HCC) 04/14/2022   Arteriosclerosis of carotid artery 02/01/2022   Essential hypertension 02/01/2022   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) 02/01/2022   History of substance abuse (HCC) 02/01/2022   Iron deficiency anemia 02/01/2022   Late effects of cerebrovascular disease 02/01/2022   Obesity 02/01/2022   Pure hypercholesterolemia 02/01/2022   Pain due to onychomycosis of toenails of both feet 06/10/2021   Neuropathic pain 04/09/2020   Pain    Spastic hemiparesis (HCC)    History of hypertension    Dyslipidemia    Right middle cerebral  artery stroke (HCC) 01/15/2020   Leukocytosis 01/12/2020   Cerebrovascular accident (CVA) due to thrombosis of right middle cerebral artery (HCC)    Chronic pain syndrome    Hypokalemia    Dysphagia, post-stroke    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/07/2020   Tobacco dependence    Marijuana dependence (HCC)    Alcohol dependence (HCC)     ONSET DATE: 02/09/2023 referral  REFERRING DIAG: G81.10 (ICD-10-CM) - Spastic hemiparesis  THERAPY DIAG:  Muscle weakness (generalized) - Plan: PT plan of care cert/re-cert  Other abnormalities of gait and mobility - Plan: PT plan of care cert/re-cert  Unsteadiness on feet - Plan: PT plan of care cert/re-cert  Abnormal posture - Plan: PT plan of care cert/re-cert  Muscle weakness - Plan: PT plan of care cert/re-cert  Rationale for Evaluation and Treatment: Rehabilitation  SUBJECTIVE:  SUBJECTIVE STATEMENT: Patient arrives to clinic alone, using LBQC. Reports doing well. Took rx late today. Denies falls/near falls.  Pt accompanied by: self  PERTINENT HISTORY: R MCA CVA, Alcohol Dependence, Marijuana/Tobacco Dependence, Back Pain   PAIN:  Are you having pain? Yes: NPRS scale: 4/10 Pain location: L shoulder, L low back Pain description: aching Aggravating factors: laying on L side, sitting too long Relieving factors: pain rx (is currently out of it)     03/02/2023    2:55 PM 03/02/2023    2:52 PM 02/22/2023    3:18 PM  Vitals with BMI  Systolic 143 148 409  Diastolic 98 105 110     There is no height or weight on file to calculate BMI.   PRECAUTIONS: Fall and Other: L spastic hemi   PATIENT GOALS: "to get back to at least 90% of normalcy" ("be able to use my left side")  OBJECTIVE:   DIAGNOSTIC FINDINGS: 01/09/20 head CT: IMPRESSION: 1.  Evolving right cerebral infarct with evidence for hemorrhagic transformation at the right basal ganglia, similar in appearance and distribution as compared to previous MRI. Associated mass effect with 3 mm of right-to-left shift. No hydrocephalus or ventricular trapping. 2. No other new acute intracranial abnormality   TODAY'S TREATMENT:     -scifit hills level 2 x 10 mins B LE only for tone management and large amplitude reciprocal coordination                                                                                                                            [facility] PT Assessment - 03/02/23 0001       Standardized Balance Assessment   Standardized Balance Assessment Berg Balance Test    10 Meter Walk .58m/s      Berg Balance Test   Sit to Stand Able to stand  independently using hands    Standing Unsupported Able to stand 2 minutes with supervision    Sitting with Back Unsupported but Feet Supported on Floor or Stool Able to sit safely and securely 2 minutes    Stand to Sit Controls descent by using hands    Transfers Able to transfer safely, definite need of hands    Standing Unsupported with Eyes Closed Able to stand 10 seconds with supervision    Standing Unsupported with Feet Together Able to place feet together independently and stand for 1 minute with supervision    From Standing, Reach Forward with Outstretched Arm Can reach forward >5 cm safely (2")    From Standing Position, Pick up Object from Floor Unable to try/needs assist to keep balance    From Standing Position, Turn to Look Behind Over each Shoulder Needs supervision when turning    Turn 360 Degrees Needs assistance while turning    Standing Unsupported, Alternately Place Feet on Step/Stool Needs assistance to keep from falling or unable to try    Standing Unsupported, One Foot in [employer] balance while stepping  or standing    Standing on One Leg Tries to lift leg/unable to hold 3 seconds but remains standing  independently    Total Score 26      Timed Up and Go Test   Normal TUG (seconds) 35.65   LBQC + CGA           -provided patient with HEP from last POC   PATIENT EDUCATION: Education details: PT POC, exam findings, HEP Person educated: Patient Education method: Explanation Education comprehension: verbalized understanding and needs further education  HOME EXERCISE PROGRAM: Updated from previous POC  Access Code: [email or web address] URL: [email or web address] Date: 03/02/2023 Prepared by: Merry Lofty  Exercises - Supine Bridge  - 2 x daily - 5 x weekly - 2 sets - 8 reps - Bent Knee Fallouts  - 2 x daily - 5 x weekly - 2 sets - 8 reps - Supine March  - 1 x daily - 7 x weekly - 3 sets - 10 reps - Supine Heel Slide  - 2 x daily - 5 x weekly - 2 sets - 8 reps - Seated Heel Slide  - 1 x daily - 5 x weekly - 2 sets - 8 reps - Sit to Stand with Armchair  - 1 x daily - 5 x weekly - 3 sets - 5 reps - Seated Hamstring Stretch  - 3 x daily - 7 x weekly - 1 sets - 3 reps - 30 sec hold - Seated Gastroc Stretch with Strap  - 3 x daily - 7 x weekly - 1 sets - 3 reps - 30 sec hold - Side to Side Weight Shift with Counter Support  - 2 x daily - 7 x weekly - 2 sets - 10 reps - Hip flexion/extension with resistance  - 3 x daily - 7 x weekly - 1 sets - 4 reps - 30 sec hold - Supine Hamstring Stretch with Caregiver  - 2 x daily - 5 x weekly - 1 sets - 3 reps - 30 sec hold - Supine Ankle Dorsiflexion Stretch with Caregiver  - 2 x daily - 5 x weekly - 1 sets - 3 reps - 30 sec hold  GOALS: Goals reviewed with patient? Yes  SHORT TERM GOALS: = LTG based on PT POC  LONG TERM GOALS: Target date: 03/11/23  Patient will be compliant with BP rx as medical provider prescribed to allow for completion of evaluation Baseline: non-compliant Goal status: INITIAL   NEW LONG TERM GOALS: Target Date: 04/01/23  1. Pt will be independent with final HEP for improved functional strength and balance     Baseline: provided needs to be reviewed   Goal status: NEW   2. Pt will improve TUG to </= 28 secs to demonstrated reduced fall risk   Baseline: 35.65s with LBQC + CGA   Goal status: NEW   3. Patient will improve BBS score to >/= 35/56 to demonstrate improved balance    Baseline: 26/56    Goal status: NEW   4. Pt will improve gait speed to >/= .39m/s to demonstrate improved community ambulation   Baseline: .77m/s    Goal status: NEW    ASSESSMENT:  CLINICAL IMPRESSION: Patient seen for skilled PT session with emphasis on outcome measure assessment. BP within appropriate therapeutic range to allow this. 10 Meter Walk Test: Patient instructed to walk 10 meters (32.8 ft) as quickly and as safely as possible at their normal speed x2 and at a fast speed x2. Time measured  from 2 meter mark to 8 meter mark to accommodate ramp-up and ramp-down.  Normal speed: .32m/s Cut off scores: <0.4 m/s = household Ambulator, 0.4-0.8 m/s = limited community Ambulator, >0.8 m/s = community Ambulator, >1.2 m/s = crossing a street, <1.0 = increased fall risk MCID 0.05 m/s (small), 0.13 m/s (moderate), 0.06 m/s (significant)  (ANPTA Core Set of Outcome Measures for Adults with Neurologic Conditions, 2018). Patient demonstrated increased fall risk noted by score of 26/56 on the [facility] Scale.  <45/56 = fall risk, <42/56 = predictive of recurrent falls, <40/56 = 100% fall risk  >41 = independent, 21-40 = assistive device, 0-20 = wheelchair level  MDC 6.9 (4 pts 45-56, 5 pts 35-44, 7 pts 25-34) (ANPTA Core Set of Outcome Measures for Adults with Neurologic Conditions, 2018). Patient completed the Timed Up and Go test (TUG) in 35.65 seconds.  Geriatrics: need for further assessment of fall risk: ? 12 sec; Recurrent falls: > 15 sec; Vestibular Disorders fall risk: > 15 sec; Parkinson's Disease fall risk: > 16 sec ([email or web address], 2023). Patient would continue to benefit from skilled PT to address the above  mentioned deficits.    OBJECTIVE IMPAIRMENTS: Abnormal gait, decreased activity tolerance, decreased balance, decreased coordination, decreased endurance, decreased knowledge of condition, decreased knowledge of use of DME, difficulty walking, decreased strength, impaired perceived functional ability, impaired tone, impaired UE functional use, postural dysfunction, and pain.   ACTIVITY LIMITATIONS: carrying, lifting, bending, standing, squatting, stairs, bathing, dressing, hygiene/grooming, locomotion level, and caring for others  PARTICIPATION LIMITATIONS: meal prep, cleaning, interpersonal relationship, driving, shopping, community activity, and occupation  PERSONAL FACTORS: Behavior pattern, Fitness, Past/current experiences, Time since onset of injury/illness/exacerbation, Transportation, and 3+ comorbidities: see above  are also affecting patient's functional outcome.   REHAB POTENTIAL: Fair time since onset  CLINICAL DECISION MAKING: Evolving/moderate complexity  EVALUATION COMPLEXITY: Moderate  PLAN:  PT FREQUENCY: 1x/week 2x a week (re-cert)  PT DURATION: 2 weeks 4 weeks (re-cert)  PLANNED INTERVENTIONS: Therapeutic exercises, Therapeutic activity, Neuromuscular re-education, Balance training, Gait training, Patient/Family education, Self Care, Joint mobilization, Stair training, Vestibular training, Visual/preceptual remediation/compensation, Orthotic/Fit training, DME instructions, Aquatic Therapy, Electrical stimulation, Taping, Manual therapy, and Re-evaluation  PLAN FOR NEXT SESSION: CHECK BP, review HEP, L hemi NMR   Westley Foots, PT, DPT, CBIS 03/02/2023, 3:38 PM

## 2023-03-07 NOTE — Therapy (Incomplete)
OUTPATIENT OCCUPATIONAL THERAPY NEURO EVALUATION  Patient Name: Derek [name] MRN: [id number] DOB:Aug 16, 1976, 47 y.o., male Today's Date: 03/09/2023  PCP: Aliene Beams, MD REFERRING PROVIDER: Ranelle Oyster, MD  END OF SESSION:  OT End of Session - 03/09/23 1449     Visit Number 1    Number of Visits 13    Date for OT Re-Evaluation 04/22/23    Authorization Type [employer] MCR primary (Form submitted), MCD secondary    OT Start Time 1400    OT Stop Time 1445    OT Time Calculation (min) 45 min    Activity Tolerance Patient tolerated treatment well    Behavior During Therapy Flat affect;WFL for tasks assessed/performed             Past Medical History:  Diagnosis Date   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/07/2020   Alcohol dependence (HCC)    Back pain    Marijuana dependence (HCC)    Tobacco dependence    Past Surgical History:  Procedure Laterality Date   BUBBLE STUDY  01/10/2020   Procedure: BUBBLE STUDY;  Surgeon: Sande Rives, MD;  Location: [facility] ENDOSCOPY;  Service: Cardiovascular;;   TEE WITHOUT CARDIOVERSION N/A 01/10/2020   Procedure: TRANSESOPHAGEAL ECHOCARDIOGRAM (TEE);  Surgeon: Sande Rives, MD;  Location: [facility] ENDOSCOPY;  Service: Cardiovascular;  Laterality: N/A;   Patient Active Problem List   Diagnosis Date Noted   Moderate major depression, single episode (HCC) 04/14/2022   Arteriosclerosis of carotid artery 02/01/2022   Essential hypertension 02/01/2022   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) 02/01/2022   History of substance abuse (HCC) 02/01/2022   Iron deficiency anemia 02/01/2022   Late effects of cerebrovascular disease 02/01/2022   Obesity 02/01/2022   Pure hypercholesterolemia 02/01/2022   Pain due to onychomycosis of toenails of both feet 06/10/2021   Neuropathic pain 04/09/2020   Pain    Spastic hemiparesis (HCC)    History of hypertension    Dyslipidemia    Right middle  cerebral artery stroke (HCC) 01/15/2020   Leukocytosis 01/12/2020   Cerebrovascular accident (CVA) due to thrombosis of right middle cerebral artery (HCC)    Chronic pain syndrome    Hypokalemia    Dysphagia, post-stroke    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/07/2020   Tobacco dependence    Marijuana dependence (HCC)    Alcohol dependence (HCC)     ONSET DATE: 02/09/2023 (referral date)   REFERRING DIAG: G81.10 (ICD-10-CM) - Spastic hemiparesis (HCC)  DX: spasticity LUE s/p botox pecs and finger flexors on 02/09/23 RX: Eval and treat, spastiicty mgt, rom  THERAPY DIAG:  Spastic hemiplegia affecting nondominant side (HCC)  Unsteadiness on feet  Pain in left arm  Other disturbances of skin sensation  Muscle weakness (generalized)  Rationale for Evaluation and Treatment: Rehabilitation  SUBJECTIVE:   SUBJECTIVE STATEMENT: I'm stiff Pt accompanied by: self  PERTINENT HISTORY: Spastic L Hemiparesis. PMH: R MCA CVA, Alcohol Dependence, Marijuana/Tobacco Dependence, Back Pain  PRECAUTIONS: Other: no driving  WEIGHT BEARING RESTRICTIONS: No  PAIN:  Are you having pain? Yes: NPRS scale: 4/10 Pain location: LUE st shoulder Pain description: sore Aggravating factors: laying on it Relieving factors: stretches, pain meds  FALLS: Has patient fallen in last 6 months? No  LIVING ENVIRONMENT: Lives with:  roommate Lives in: House/apartment, 8 steps to enter, 1 level home Has following equipment at home: Quad cane small base, Wheelchair (manual), Shower bench, and bed side commode  PLOF: Independent prior to stroke 2021, on disability  PATIENT GOALS: Improve Lt arm and balance  OBJECTIVE:   HAND DOMINANCE: Right  ADLs: Overall ADLs: Has aide M-Sun 2-4 hrs/day to assist with bathing, and some dressing, cooking and cleaning Transfers/ambulation related to ADLs: mod I w/ quad cane Eating: mod I eating, difficulty cutting food and occasional assist from  aide Grooming: mod I brushing teeth and shaving UB Dressing: mod I for pull over shirt, assist for button up shirt LB Dressing: Aide assist getting pants over feet and assist for donning shoes and socks Toileting: mod I Bathing: max assist from aide Tub Shower transfers: min assist for leg management with tub bench Equipment: Transfer tub bench, Grab bars, and hand held shower  IADLs: Shopping: has groceries delivered [employer]: aide does Meal Prep: aide or roommate does but can get snack or heat up something in [employer] mobility: relies on friends Medication management: independent Landscape architect: roommate does Handwriting:  denies change  MOBILITY STATUS:  walks with quad cane in house and short distances   UPPER EXTREMITY ROM:  RUE AROM WNL's LUE: Limited active movement - sh shrug w/ slight elbow flex dominated by synergy pattern  Passive ROM Right eval Left eval  Shoulder flexion  110*  Shoulder abduction  80*  Shoulder adduction    Shoulder extension    Shoulder internal rotation    Shoulder external rotation    Elbow flexion  90%  Elbow extension  90%  Wrist flexion  90%  Wrist extension  75%  Wrist ulnar deviation    Wrist radial deviation    Wrist pronation    Wrist supination  90%  (Blank rows = not tested) Passive finger extension full with wrist flexion. Difficulty at PIP joint extension, especially LF - at rest, PIP flexed, DIP hyperextended  HAND FUNCTION: No function LT HAND  COORDINATION: No function Lt hand  SENSATION: Significantly diminished - cannot detect location  EDEMA: minimal Lt hand  MUSCLE TONE: LUE: Moderate, Hypertonic, and Modifed Ashworth Scale 2 = More marked increase in muscle tone through most of the ROM, but affected part(s) easily moved  COGNITION: Overall cognitive status:  pt reports memory deficits  VISION: Subjective report: I need glasses but no changes in vision from stroke  Visual  history:  none  PERCEPTION: Not tested  PRAXIS: Not tested  OBSERVATIONS: flat affect   TODAY'S TREATMENT:                                                                                                                              N/A - eval only  PATIENT EDUCATION: Education details: OT POC Person educated: Patient Education method: Explanation Education comprehension: verbalized understanding  HOME EXERCISE PROGRAM: N/A Today   GOALS: Goals reviewed with patient? Yes  SHORT TERM GOALS: Target date: 04/01/23  Independent with wear and care resting hand splint for LUE Baseline: Goal status: INITIAL  2.  Independent HEP for LUE self stretches  Baseline:  Goal status: INITIAL  3.  Pt to verbalize understanding with memory compensation strategies Baseline:  Goal status: INITIAL   LONG TERM GOALS: Target date: 04/22/23  Pt to verbalize understanding with potential A/E needs for one handed use to increase independence with ADLS (rocker knife, shoe buttons, LH sponge, etc)  Baseline:  Goal status: INITIAL  2.  Pt to cut food I'ly with A/E Baseline:  Goal status: INITIAL  3.  Pt to consistently perform all UB dressing except buttons Baseline:  Goal status: INITIAL  4.  Pt to perform LB dressing w/ no more than min assist with A/E prn Baseline:  Goal status: INITIAL   ASSESSMENT:  CLINICAL IMPRESSION: Patient is a 47 y.o. male who was seen today for occupational therapy evaluation for spastic hemiplegia Lt non dominant side from stroke in 2021? Pt with recent botox injections to LUE on 02/09/23 to pects and finger flexors and would benefit from O.T. for developing stretching HEP and resting hand splint following injections, as well as increased participation in ADLS.   PERFORMANCE DEFICITS: in functional skills including ADLs, IADLs, coordination, dexterity, sensation, tone, ROM, strength, pain, flexibility, mobility, body mechanics, decreased knowledge of use of  DME, and UE functional use, cognitive skills including memory, and psychosocial skills including coping strategies.   IMPAIRMENTS: are limiting patient from ADLs, IADLs, and rest and sleep.   CO-MORBIDITIES: may have co-morbidities  that affects occupational performance. Patient will benefit from skilled OT to address above impairments and improve overall function.  MODIFICATION OR ASSISTANCE TO COMPLETE EVALUATION: No modification of tasks or assist necessary to complete an evaluation.  OT OCCUPATIONAL PROFILE AND HISTORY: Problem focused assessment: Including review of records relating to presenting problem.  CLINICAL DECISION MAKING: Moderate - several treatment options, min-mod task modification necessary  REHAB POTENTIAL: Good  EVALUATION COMPLEXITY: Low    PLAN:  OT FREQUENCY: 2x/week  OT DURATION: 6 weeks (plus evaluation)  PLANNED INTERVENTIONS: self care/ADL training, therapeutic exercise, therapeutic activity, neuromuscular re-education, manual therapy, passive range of motion, functional mobility training, splinting, moist heat, patient/family education, cognitive remediation/compensation, coping strategies training, and DME and/or AE instructions  RECOMMENDED OTHER SERVICES: none at this time - currently seeing P.T.   CONSULTED AND AGREED WITH PLAN OF CARE: Patient  PLAN FOR NEXT SESSION: Resting hand splint, monitor BP    Sheran Lawless, OT 03/09/2023, 3:09 PM

## 2023-03-09 ENCOUNTER — Encounter: Payer: Self-pay | Admitting: Occupational Therapy

## 2023-03-09 ENCOUNTER — Encounter: Payer: Self-pay | Admitting: Physical Therapy

## 2023-03-09 ENCOUNTER — Ambulatory Visit: Payer: Medicare HMO | Admitting: Physical Therapy

## 2023-03-09 ENCOUNTER — Telehealth: Payer: Self-pay | Admitting: Physical Therapy

## 2023-03-09 ENCOUNTER — Ambulatory Visit: Payer: Medicare HMO | Admitting: Occupational Therapy

## 2023-03-09 VITALS — BP 150/104 | HR 66

## 2023-03-09 DIAGNOSIS — R208 Other disturbances of skin sensation: Secondary | ICD-10-CM | POA: Diagnosis not present

## 2023-03-09 DIAGNOSIS — M79602 Pain in left arm: Secondary | ICD-10-CM | POA: Diagnosis not present

## 2023-03-09 DIAGNOSIS — M6281 Muscle weakness (generalized): Secondary | ICD-10-CM

## 2023-03-09 DIAGNOSIS — G8110 Spastic hemiplegia affecting unspecified side: Secondary | ICD-10-CM | POA: Diagnosis not present

## 2023-03-09 DIAGNOSIS — R2681 Unsteadiness on feet: Secondary | ICD-10-CM | POA: Diagnosis not present

## 2023-03-09 DIAGNOSIS — R2689 Other abnormalities of gait and mobility: Secondary | ICD-10-CM | POA: Diagnosis not present

## 2023-03-09 DIAGNOSIS — R293 Abnormal posture: Secondary | ICD-10-CM | POA: Diagnosis not present

## 2023-03-09 NOTE — Telephone Encounter (Signed)
Left message for Dr. Tracie Harrier with front office regarding pt elevated BP and to call pt for further management as they see fit.  Left PT name and call back number for front desk in the event there are any questions.  Camille Bal, PT, DPT

## 2023-03-09 NOTE — Therapy (Signed)
OUTPATIENT PHYSICAL THERAPY NEURO TREATMENT-ARRIVED NO CHARGE   Patient Name: Derek [name] MRN: [id number] DOB:1976/07/26, 47 y.o., male Today's Date: 03/09/2023   PCP: Aliene Beams, MD REFERRING PROVIDER: Ranelle Oyster, MD  END OF SESSION:  PT End of Session - 03/09/23 1447     Visit Number 2    Number of Visits 10    Date for PT Re-Evaluation 04/01/23    Authorization Type [employer]    PT Start Time 1447   handoff from OT   PT Stop Time 1515    PT Time Calculation (min) 28 min    Equipment Utilized During Treatment Gait belt    Activity Tolerance Other (comment)   tx held due to elevated diastolic BP   Behavior During Therapy Flat affect             Past Medical History:  Diagnosis Date   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/07/2020   Alcohol dependence (HCC)    Back pain    Marijuana dependence (HCC)    Tobacco dependence    Past Surgical History:  Procedure Laterality Date   BUBBLE STUDY  01/10/2020   Procedure: BUBBLE STUDY;  Surgeon: Sande Rives, MD;  Location: [facility] ENDOSCOPY;  Service: Cardiovascular;;   TEE WITHOUT CARDIOVERSION N/A 01/10/2020   Procedure: TRANSESOPHAGEAL ECHOCARDIOGRAM (TEE);  Surgeon: Sande Rives, MD;  Location: [facility] ENDOSCOPY;  Service: Cardiovascular;  Laterality: N/A;   Patient Active Problem List   Diagnosis Date Noted   Moderate major depression, single episode (HCC) 04/14/2022   Arteriosclerosis of carotid artery 02/01/2022   Essential hypertension 02/01/2022   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) 02/01/2022   History of substance abuse (HCC) 02/01/2022   Iron deficiency anemia 02/01/2022   Late effects of cerebrovascular disease 02/01/2022   Obesity 02/01/2022   Pure hypercholesterolemia 02/01/2022   Pain due to onychomycosis of toenails of both feet 06/10/2021   Neuropathic pain 04/09/2020   Pain    Spastic hemiparesis (HCC)    History of  hypertension    Dyslipidemia    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   Leukocytosis 01/12/2020   Cerebrovascular accident (CVA) due to thrombosis of right middle cerebral artery (HCC)    Chronic pain syndrome    Hypokalemia    Dysphagia, post-stroke    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/07/2020   Tobacco dependence    Marijuana dependence (HCC)    Alcohol dependence (HCC)     ONSET DATE: 02/09/2023 referral  REFERRING DIAG: G81.10 (ICD-10-CM) - Spastic hemiparesis  THERAPY DIAG:  Muscle weakness (generalized)  Other abnormalities of gait and mobility  Unsteadiness on feet  Abnormal posture  Rationale for Evaluation and Treatment: Rehabilitation  SUBJECTIVE:  SUBJECTIVE STATEMENT: Patient arrives to clinic alone, using [facility]QC. Reports doing well. Denies falls/near falls.  He denies any acute changes. He took his BP meds at his regular time today. Pt accompanied by: self  PERTINENT HISTORY: R MCA CVA, Alcohol Dependence, Marijuana/Tobacco Dependence, Back Pain   PAIN:  Are you having pain? Yes: NPRS scale: 4/10 Pain location: L shoulder, L low back Pain description: aching Aggravating factors: laying on L side, sitting too long Relieving factors: pain rx (is currently out of it)     03/09/2023    3:08 PM 03/09/2023    2:57 PM 03/09/2023    2:55 PM  Vitals with BMI  Systolic 150 138 295  Diastolic 104 103 621  Pulse 66 60 59     There is no height or weight on file to calculate BMI.   PRECAUTIONS: Fall and Other: L spastic hemi   PATIENT GOALS: "to get back to at least 90% of normalcy" ("be able to use my left side")  OBJECTIVE:   DIAGNOSTIC FINDINGS: 01/09/20 head CT: IMPRESSION: 1. Evolving right cerebral infarct with evidence for  hemorrhagic transformation at the right basal ganglia, similar in appearance and distribution as compared to previous MRI. Associated mass effect with 3 mm of right-to-left shift. No hydrocephalus or ventricular trapping. 2. No other new acute intracranial abnormality   TODAY'S TREATMENT:    Assessed R BP in sitting prior to session: Today's Vitals   03/09/23 1455 03/09/23 1457 03/09/23 1508  BP: (!) 144/103 (!) 138/103 (!) 150/104  Pulse: (!) 59 60 66  *Third BP taken manually after return from restroom.    PT escorts pt to and from restroom holding door as it is too heavy for pt to manage.     PATIENT EDUCATION: Education details: s/s of stroke or requiring emergency attention due to pt having slight headache w/ elevated BP today. Person educated: Patient Education method: Explanation Education comprehension: verbalized understanding and needs further education  HOME EXERCISE PROGRAM: Updated from previous POC  Access Code: [email or web address] URL: [email or web address] Date: 03/02/2023 Prepared by: Merry Lofty  Exercises - Supine Bridge  - 2 x daily - 5 x weekly - 2 sets - 8 reps - Bent Knee Fallouts  - 2 x daily - 5 x weekly - 2 sets - 8 reps - Supine March  - 1 x daily - 7 x weekly - 3 sets - 10 reps - Supine Heel Slide  - 2 x daily - 5 x weekly - 2 sets - 8 reps - Seated Heel Slide  - 1 x daily - 5 x weekly - 2 sets - 8 reps - Sit to Stand with Armchair  - 1 x daily - 5 x weekly - 3 sets - 5 reps - Seated Hamstring Stretch  - 3 x daily - 7 x weekly - 1 sets - 3 reps - 30 sec hold - Seated Gastroc Stretch with Strap  - 3 x daily - 7 x weekly - 1 sets - 3 reps - 30 sec hold - Side to Side Weight Shift with Counter Support  - 2 x daily - 7 x weekly - 2 sets - 10 reps - Hip flexion/extension with resistance  - 3 x daily - 7 x weekly - 1 sets - 4 reps - 30 sec hold - Supine Hamstring Stretch with Caregiver  - 2 x daily - 5 x weekly - 1 sets - 3 reps - 30 sec  hold - Supine Ankle Dorsiflexion Stretch  with Caregiver  - 2 x daily - 5 x weekly - 1 sets - 3 reps - 30 sec hold  GOALS: Goals reviewed with patient? Yes  SHORT TERM GOALS: = LTG based on PT POC  LONG TERM GOALS: Target date: 03/11/23  Patient will be compliant with BP rx as medical provider prescribed to allow for completion of evaluation Baseline: non-compliant Goal status: INITIAL   NEW LONG TERM GOALS: Target Date: 04/01/23  1. Pt will be independent with final HEP for improved functional strength and balance    Baseline: provided needs to be reviewed   Goal status: NEW   2. Pt will improve TUG to </= 28 secs to demonstrated reduced fall risk   Baseline: 35.65s with LBQC + CGA   Goal status: NEW   3. Patient will improve BBS score to >/= 35/56 to demonstrate improved balance    Baseline: 26/56    Goal status: NEW   4. Pt will improve gait speed to >/= .37m/s to demonstrate improved community ambulation   Baseline: .9m/s    Goal status: NEW    ASSESSMENT:  CLINICAL IMPRESSION: PT treatment held this session with pt having slight headache with elevated diastolic BP outside safe parameters for therapy.  BP appears elevated but stable following ambulation to restroom and back to gym with therapist for manual re-assessment prior to allowing pt to leave clinic.  He was educated on need for ED services if headache progressed or other symptoms of stroke emerged.  Encouraged PCP follow-up w/ PT to reach out on behalf of pt due to variability noted last session and elevation noted today.  OBJECTIVE IMPAIRMENTS: Abnormal gait, decreased activity tolerance, decreased balance, decreased coordination, decreased endurance, decreased knowledge of condition, decreased knowledge of use of DME, difficulty walking, decreased strength, impaired perceived functional ability, impaired tone, impaired UE functional use, postural dysfunction, and pain.   ACTIVITY LIMITATIONS: carrying, lifting,  bending, standing, squatting, stairs, bathing, dressing, hygiene/grooming, locomotion level, and caring for others  PARTICIPATION LIMITATIONS: meal prep, cleaning, interpersonal relationship, driving, shopping, community activity, and occupation  PERSONAL FACTORS: Behavior pattern, Fitness, Past/current experiences, Time since onset of injury/illness/exacerbation, Transportation, and 3+ comorbidities: see above  are also affecting patient's functional outcome.   REHAB POTENTIAL: Fair time since onset  CLINICAL DECISION MAKING: Evolving/moderate complexity  EVALUATION COMPLEXITY: Moderate  PLAN:  PT FREQUENCY: 1x/week 2x a week (re-cert)  PT DURATION: 2 weeks 4 weeks (re-cert)  PLANNED INTERVENTIONS: Therapeutic exercises, Therapeutic activity, Neuromuscular re-education, Balance training, Gait training, Patient/Family education, Self Care, Joint mobilization, Stair training, Vestibular training, Visual/preceptual remediation/compensation, Orthotic/Fit training, DME instructions, Aquatic Therapy, Electrical stimulation, Taping, Manual therapy, and Re-evaluation  PLAN FOR NEXT SESSION: CHECK BP, review HEP, L hemi NMR  Sadie Haber, PT, DPT 03/09/2023, 3:24 PM

## 2023-03-10 ENCOUNTER — Telehealth: Payer: Self-pay | Admitting: Physical Therapy

## 2023-03-10 NOTE — Telephone Encounter (Signed)
PT LVM on direct line for [facility], Dr. Malena Peer assistant, in regards to patient's recent BP trends and medication compliance.  Will encourage pt to reach out for PCP follow-up as MD reports pt has not been seen in a while.  Of note, pt is currently unable to unlock his phone to check messages and return calls per report from last session so PT may have to sit with pt and call to schedule appt during next session as pt desires.  Camille Bal, PT, DPT

## 2023-03-11 ENCOUNTER — Ambulatory Visit: Payer: Medicare HMO | Admitting: Physical Therapy

## 2023-03-11 ENCOUNTER — Encounter: Payer: Self-pay | Admitting: Physical Therapy

## 2023-03-11 VITALS — BP 141/96 | HR 66

## 2023-03-11 DIAGNOSIS — R208 Other disturbances of skin sensation: Secondary | ICD-10-CM | POA: Diagnosis not present

## 2023-03-11 DIAGNOSIS — R2689 Other abnormalities of gait and mobility: Secondary | ICD-10-CM

## 2023-03-11 DIAGNOSIS — R2681 Unsteadiness on feet: Secondary | ICD-10-CM | POA: Diagnosis not present

## 2023-03-11 DIAGNOSIS — M6281 Muscle weakness (generalized): Secondary | ICD-10-CM

## 2023-03-11 DIAGNOSIS — G8110 Spastic hemiplegia affecting unspecified side: Secondary | ICD-10-CM

## 2023-03-11 DIAGNOSIS — R293 Abnormal posture: Secondary | ICD-10-CM | POA: Diagnosis not present

## 2023-03-11 DIAGNOSIS — M79602 Pain in left arm: Secondary | ICD-10-CM | POA: Diagnosis not present

## 2023-03-11 NOTE — Therapy (Signed)
OUTPATIENT PHYSICAL THERAPY NEURO TREATMENT-ARRIVED NO CHARGE   Patient Name: Derek [name] MRN: [id number] DOB:[birth year], 47 y.o., male Today's Date: 03/11/2023   PCP: Aliene Beams, MD REFERRING PROVIDER: Ranelle Oyster, MD  END OF SESSION:  PT End of Session - 03/11/23 1412     Visit Number 3    Number of Visits 10    Date for PT Re-Evaluation 04/01/23    Authorization Type [employer]    PT Start Time 1408    PT Stop Time 1436   treatment time shortened due to patien having to use the bathroom at end of session; given length of time unable to continue additional treatment   PT Time Calculation (min) 28 min    Equipment Utilized During Treatment Gait belt    Activity Tolerance Patient tolerated treatment well;Other (comment)   limited by needing to have a bowel movement   Behavior During Therapy Flat affect;WFL for tasks assessed/performed             Past Medical History:  Diagnosis Date   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/07/2020   Alcohol dependence (HCC)    Back pain    Marijuana dependence (HCC)    Tobacco dependence    Past Surgical History:  Procedure Laterality Date   BUBBLE STUDY  01/10/2020   Procedure: BUBBLE STUDY;  Surgeon: Sande Rives, MD;  Location: [facility] ENDOSCOPY;  Service: Cardiovascular;;   TEE WITHOUT CARDIOVERSION N/A 01/10/2020   Procedure: TRANSESOPHAGEAL ECHOCARDIOGRAM (TEE);  Surgeon: Sande Rives, MD;  Location: 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) 02/01/2022   History of substance abuse (HCC) 02/01/2022   Iron deficiency anemia 02/01/2022   Late effects of cerebrovascular disease 02/01/2022   Obesity  02/01/2022   Pure hypercholesterolemia 02/01/2022   Pain due to onychomycosis of toenails of both feet 06/10/2021   Neuropathic pain 04/09/2020   Pain    Spastic hemiparesis (HCC)    History of hypertension    Dyslipidemia    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   Leukocytosis 01/12/2020   Cerebrovascular accident (CVA) due to thrombosis of right middle cerebral artery (HCC)    Chronic pain syndrome    Hypokalemia    Dysphagia, post-stroke    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/07/2020   Tobacco dependence    Marijuana dependence (HCC)    Alcohol dependence (HCC)     ONSET DATE: 02/09/2023 referral  REFERRING DIAG: G81.10 (ICD-10-CM) - Spastic hemiparesis  THERAPY DIAG:  Muscle weakness (generalized)  Other abnormalities of gait and mobility  Unsteadiness on feet  Abnormal posture  Spastic hemiplegia affecting nondominant side (HCC)  Rationale for Evaluation and Treatment: Rehabilitation  SUBJECTIVE:  SUBJECTIVE STATEMENT: Patient arrives to clinic alone, using [facility]QC. Reports doing well. Denies falls/near falls.  He denies any acute changes. He took his BP meds at his regular time today. Pt accompanied by: self  PERTINENT HISTORY: R MCA CVA, Alcohol Dependence, Marijuana/Tobacco Dependence, Back Pain   PAIN:  Are you having pain? Yes: NPRS scale: 4/10 Pain location: L shoulder, L low back Pain description: aching Aggravating factors: laying on L side, sitting too long Relieving factors: pain rx (is currently out of it)     03/11/2023    2:14 PM 03/09/2023    3:08 PM 03/09/2023    2:57 PM  Vitals with BMI  Systolic 141 150 098  Diastolic 96 104 103  Pulse 66 66 60     There is no height or weight on file to calculate BMI.   PRECAUTIONS: Fall and Other: L  spastic hemi   PATIENT GOALS: "to get back to at least 90% of normalcy" ("be able to use my left side")  OBJECTIVE:   DIAGNOSTIC FINDINGS: 01/09/20 head CT: IMPRESSION: 1. Evolving right cerebral infarct with evidence for hemorrhagic transformation at the right basal ganglia, similar in appearance and distribution as compared to previous MRI. Associated mass effect with 3 mm of right-to-left shift. No hydrocephalus or ventricular trapping. 2. No other new acute intracranial abnormality   TODAY'S TREATMENT:     Vitals:   03/11/23 1414  BP: (!) 141/96  Pulse: 66    TherAct: Patient had not reached out to PCP to schedule appointment. BP continues to run elevated. Therapist sat with patient and worked with patient to contact PCP to get in for a visit to manage elevated BP readings. Patient was scheduled for a vist on May 14th at 11:30 am. Therapist recorded information for patient. Continued to educated on BP safety.   NMR: Dynamic balance and navigation around clinic with large base quad cane and SBA, education on limb advancement techniques to reduce hip circumduction 4 x 100' throughout session Donned 6lb ankle weight on LLE with use of hurry cane and practiced step over large foam blocks with bilateral LE, emphasized external target to drive knee to in order to prevent knee circumduction on LLE 6 x 15' with 3 step overs each round   PATIENT EDUCATION: Education details: See above, continue HEP Person educated: Patient Education method: Explanation Education comprehension: verbalized understanding and needs further education  HOME EXERCISE PROGRAM: Updated from previous POC  Access Code: [email or web address] URL: [email or web address] Date: 03/02/2023 Prepared by: Merry Lofty  Exercises - Supine Bridge  - 2 x daily - 5 x weekly - 2 sets - 8 reps - Bent Knee Fallouts  - 2 x daily - 5 x weekly - 2 sets - 8 reps - Supine March  - 1 x daily - 7 x weekly - 3 sets - 10  reps - Supine Heel Slide  - 2 x daily - 5 x weekly - 2 sets - 8 reps - Seated Heel Slide  - 1 x daily - 5 x weekly - 2 sets - 8 reps - Sit to Stand with Armchair  - 1 x daily - 5 x weekly - 3 sets - 5 reps - Seated Hamstring Stretch  - 3 x daily - 7 x weekly - 1 sets - 3 reps - 30 sec hold - Seated Gastroc Stretch with Strap  - 3 x daily - 7 x weekly - 1 sets - 3 reps - 30 sec hold - Side to  Side Weight Shift with Counter Support  - 2 x daily - 7 x weekly - 2 sets - 10 reps - Hip flexion/extension with resistance  - 3 x daily - 7 x weekly - 1 sets - 4 reps - 30 sec hold - Supine Hamstring Stretch with Caregiver  - 2 x daily - 5 x weekly - 1 sets - 3 reps - 30 sec hold - Supine Ankle Dorsiflexion Stretch with Caregiver  - 2 x daily - 5 x weekly - 1 sets - 3 reps - 30 sec hold  GOALS: Goals reviewed with patient? Yes  SHORT TERM GOALS: = LTG based on PT POC  LONG TERM GOALS: Target date: 03/11/23  Patient will be compliant with BP rx as medical provider prescribed to allow for completion of evaluation Baseline: non-compliant Goal status: INITIAL   NEW LONG TERM GOALS: Target Date: 04/01/23  1. Pt will be independent with final HEP for improved functional strength and balance    Baseline: provided needs to be reviewed   Goal status: NEW   2. Pt will improve TUG to </= 28 secs to demonstrated reduced fall risk   Baseline: 35.65s with LBQC + CGA   Goal status: NEW   3. Patient will improve BBS score to >/= 35/56 to demonstrate improved balance    Baseline: 26/56    Goal status: NEW   4. Pt will improve gait speed to >/= .27m/s to demonstrate improved community ambulation   Baseline: .38m/s    Goal status: NEW    ASSESSMENT:  CLINICAL IMPRESSION: PT treatment held this session with pt having slight headache with elevated diastolic BP outside safe parameters for therapy.  BP appears elevated but stable following ambulation to restroom and back to gym with therapist for manual  re-assessment prior to allowing pt to leave clinic.  He was educated on need for ED services if headache progressed or other symptoms of stroke emerged.  Encouraged PCP follow-up w/ PT to reach out on behalf of pt due to variability noted last session and elevation noted today.  OBJECTIVE IMPAIRMENTS: Abnormal gait, decreased activity tolerance, decreased balance, decreased coordination, decreased endurance, decreased knowledge of condition, decreased knowledge of use of DME, difficulty walking, decreased strength, impaired perceived functional ability, impaired tone, impaired UE functional use, postural dysfunction, and pain.   ACTIVITY LIMITATIONS: carrying, lifting, bending, standing, squatting, stairs, bathing, dressing, hygiene/grooming, locomotion level, and caring for others  PARTICIPATION LIMITATIONS: meal prep, cleaning, interpersonal relationship, driving, shopping, community activity, and occupation  PERSONAL FACTORS: Behavior pattern, Fitness, Past/current experiences, Time since onset of injury/illness/exacerbation, Transportation, and 3+ comorbidities: see above  are also affecting patient's functional outcome.   REHAB POTENTIAL: Fair time since onset  CLINICAL DECISION MAKING: Evolving/moderate complexity  EVALUATION COMPLEXITY: Moderate  PLAN:  PT FREQUENCY: 1x/week 2x a week (re-cert)  PT DURATION: 2 weeks 4 weeks (re-cert)  PLANNED INTERVENTIONS: Therapeutic exercises, Therapeutic activity, Neuromuscular re-education, Balance training, Gait training, Patient/Family education, Self Care, Joint mobilization, Stair training, Vestibular training, Visual/preceptual remediation/compensation, Orthotic/Fit training, DME instructions, Aquatic Therapy, Electrical stimulation, Taping, Manual therapy, and Re-evaluation  PLAN FOR NEXT SESSION: CHECK BP, review HEP, L hemi NMR, remind of PCP appointment on 5/14 at 11:30am  Carmelia Bake, PT, DPT 03/11/2023, 4:27 PM

## 2023-03-14 ENCOUNTER — Ambulatory Visit: Payer: Medicare HMO | Admitting: Occupational Therapy

## 2023-03-14 ENCOUNTER — Encounter: Payer: Self-pay | Admitting: Physical Therapy

## 2023-03-14 ENCOUNTER — Ambulatory Visit: Payer: Medicare HMO | Admitting: Physical Therapy

## 2023-03-14 ENCOUNTER — Encounter: Payer: Self-pay | Admitting: Occupational Therapy

## 2023-03-14 VITALS — BP 141/97 | HR 63

## 2023-03-14 DIAGNOSIS — R2689 Other abnormalities of gait and mobility: Secondary | ICD-10-CM | POA: Diagnosis not present

## 2023-03-14 DIAGNOSIS — R293 Abnormal posture: Secondary | ICD-10-CM | POA: Diagnosis not present

## 2023-03-14 DIAGNOSIS — G8110 Spastic hemiplegia affecting unspecified side: Secondary | ICD-10-CM

## 2023-03-14 DIAGNOSIS — M6281 Muscle weakness (generalized): Secondary | ICD-10-CM | POA: Diagnosis not present

## 2023-03-14 DIAGNOSIS — R2681 Unsteadiness on feet: Secondary | ICD-10-CM

## 2023-03-14 DIAGNOSIS — R208 Other disturbances of skin sensation: Secondary | ICD-10-CM | POA: Diagnosis not present

## 2023-03-14 DIAGNOSIS — M79602 Pain in left arm: Secondary | ICD-10-CM | POA: Diagnosis not present

## 2023-03-14 NOTE — Therapy (Signed)
OUTPATIENT PHYSICAL THERAPY NEURO TREATMENT   Patient Name: Derek [name] MRN: [id number] DOB:21-Nov-1975, 47 y.o., male Today's Date: 03/14/2023   PCP: Aliene Beams, MD REFERRING PROVIDER: Ranelle Oyster, MD  END OF SESSION:  PT End of Session - 03/14/23 1459     Visit Number 4    Number of Visits 10    Date for PT Re-Evaluation 04/01/23    Authorization Type [employer] medicare    PT Start Time 1451    PT Stop Time 1530    PT Time Calculation (min) 39 min    Equipment Utilized During Treatment Gait belt    Activity Tolerance Patient tolerated treatment well;Other (comment)   HTN   Behavior During Therapy Flat affect;WFL for tasks assessed/performed             Past Medical History:  Diagnosis Date   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/07/2020   Alcohol dependence (HCC)    Back pain    Marijuana dependence (HCC)    Tobacco dependence    Past Surgical History:  Procedure Laterality Date   BUBBLE STUDY  01/10/2020   Procedure: BUBBLE STUDY;  Surgeon: Sande Rives, MD;  Location: [facility] ENDOSCOPY;  Service: Cardiovascular;;   TEE WITHOUT CARDIOVERSION N/A 01/10/2020   Procedure: TRANSESOPHAGEAL ECHOCARDIOGRAM (TEE);  Surgeon: Sande Rives, MD;  Location: [facility] ENDOSCOPY;  Service: Cardiovascular;  Laterality: N/A;   Patient Active Problem List   Diagnosis Date Noted   Moderate major depression, single episode (HCC) 04/14/2022   Arteriosclerosis of carotid artery 02/01/2022   Essential hypertension 02/01/2022   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) 02/01/2022   History of substance abuse (HCC) 02/01/2022   Iron deficiency anemia 02/01/2022   Late effects of cerebrovascular disease 02/01/2022   Obesity 02/01/2022   Pure hypercholesterolemia 02/01/2022   Pain due to onychomycosis of toenails of both feet 06/10/2021   Neuropathic pain 04/09/2020   Pain    Spastic hemiparesis (HCC)    History of hypertension     Dyslipidemia    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   Leukocytosis 01/12/2020   Cerebrovascular accident (CVA) due to thrombosis of right middle cerebral artery (HCC)    Chronic pain syndrome    Hypokalemia    Dysphagia, post-stroke    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/07/2020   Tobacco dependence    Marijuana dependence (HCC)    Alcohol dependence (HCC)     ONSET DATE: 02/09/2023 referral  REFERRING DIAG: G81.10 (ICD-10-CM) - Spastic hemiparesis  THERAPY DIAG:  Spastic hemiplegia affecting nondominant side (HCC)  Muscle weakness (generalized)  Other abnormalities of gait and mobility  Unsteadiness on feet  Abnormal posture  Rationale for Evaluation and Treatment: Rehabilitation  SUBJECTIVE:  SUBJECTIVE STATEMENT: Patient arrives to clinic alone, using LBQC. Reports doing well. Denies falls/near falls.  He denies any acute changes. He reports taking his medicine.  PT asks pt about upcoming appts and his verbalizes time and date of PCP follow-up (5/14 at 11:30am) and denies need for written reminder.  He is also able to access his phone for reminders now. Pt accompanied by: self  PERTINENT HISTORY: R MCA CVA, Alcohol Dependence, Marijuana/Tobacco Dependence, Back Pain   PAIN:  Are you having pain? Yes: NPRS scale: 1/10 Pain location: L neck/headache Pain description: aching Aggravating factors: unsure Relieving factors: unsure     03/14/2023    2:55 PM 03/14/2023    2:53 PM 03/11/2023    2:14 PM  Vitals with BMI  Systolic 141 137 161  Diastolic 97 100 96  Pulse 63 67 66     There is no height or weight on file to calculate BMI.   PRECAUTIONS: Fall and Other: L spastic hemi   PATIENT GOALS: "to get back to at least 90% of normalcy" ("be able to use  my left side")  OBJECTIVE:   DIAGNOSTIC FINDINGS: 01/09/20 head CT: IMPRESSION: 1. Evolving right cerebral infarct with evidence for hemorrhagic transformation at the right basal ganglia, similar in appearance and distribution as compared to previous MRI. Associated mass effect with 3 mm of right-to-left shift. No hydrocephalus or ventricular trapping. 2. No other new acute intracranial abnormality   TODAY'S TREATMENT:     Vitals:   03/14/23 1453 03/14/23 1455  BP: (!) 137/100 (!) 141/97  Pulse: 67 63    TherAct: Edu on ongoing BP med compliance, concern regarding "slight headache" in setting of elevated BP, parameters for therapy and general exercise at home.  Pt expresses frustration.  PT explains risk of proceeding with PT and intermittent monitoring of BP to ensure pt is safe throughout.  Pt reports desire to proceed and PT agrees as BP just within parameters on second reading.  PT emphasized pt immediate report of change in symptoms with activity.  Pt verbalized understanding.    Pt states he wants to put a hot towel on his neck, PT educates pt on staying away from excessive heat while BP is high.  Discussed alternative over-the-counter topical options vs ice vs gentle stretching.  Began reviewing/modifying HEP: -Staggered STS 2x4 w/ extended rest between sets, edu to pt on benefit to LLE strength as he reports the left leg is weaker -Supine bridges 2x8, extended rest b/w sets to allow for BP return to baseline, PT provides quick approximation through left hip joint to improve lift on left side of pelvis during second bout -Supine bent knee fallouts x20 each LE, pt has compensation w/ lower trunk rocking vs separation so deleted from HEP, some left shoulder discomfort that PT was unable to solve with positioning on pillow so pt returned to upright for BP assessment.  BP assessed on RUE in sitting: 143/99, HR 66bpm  Seated heel slide on EOM w/ pillowcase under foot for  frictionless surface x6, pt can only achieve up to 90 degrees in sitting, will compare to supine next session.  Pt remains unchanged at end of session reporting no worsening of symptoms.  Encouraged monitoring BP at home as able and stressed importance of showing up to PCP appt tomorrow.  Pt verbalized understanding.  PATIENT EDUCATION: Education details: See above, continue reviewing/modified portion of HEP. Person educated: Patient Education method: Explanation Education comprehension: verbalized understanding and needs further education  HOME  EXERCISE PROGRAM: Reprinted for pt on 03/14/2023!  Access Code: [email or web address] URL: [email or web address] Date: 03/14/2023 Prepared by: Camille Bal  Exercises - Supine Bridge  - 2 x daily - 5 x weekly - 2 sets - 8 reps - Supine March  - 1 x daily - 7 x weekly - 3 sets - 10 reps - Supine Heel Slide  - 2 x daily - 5 x weekly - 2 sets - 8 reps - Seated Heel Slide  - 1 x daily - 5 x weekly - 2 sets - 8 reps - Seated Hamstring Stretch  - 3 x daily - 7 x weekly - 1 sets - 3 reps - 30 sec hold - Seated Gastroc Stretch with Strap  - 3 x daily - 7 x weekly - 1 sets - 3 reps - 30 sec hold - Side to Side Weight Shift with Counter Support  - 2 x daily - 7 x weekly - 2 sets - 10 reps - Hip flexion/extension with resistance  - 3 x daily - 7 x weekly - 1 sets - 4 reps - 30 sec hold - Supine Hamstring Stretch with Caregiver  - 2 x daily - 5 x weekly - 1 sets - 3 reps - 30 sec hold - Supine Ankle Dorsiflexion Stretch with Caregiver  - 2 x daily - 5 x weekly - 1 sets - 3 reps - 30 sec hold - Staggered Sit-to-Stand  - 1 x daily - 5 x weekly - 3 sets - 5 reps  GOALS: Goals reviewed with patient? Yes  SHORT TERM GOALS: = LTG based on PT POC  LONG TERM GOALS: Target date: 03/11/23  Patient will be compliant with BP rx as medical provider prescribed to allow for completion of evaluation Baseline: non-compliant Goal status: INITIAL   NEW LONG  TERM GOALS: Target Date: 04/01/23  1. Pt will be independent with final HEP for improved functional strength and balance    Baseline: provided needs to be reviewed   Goal status: NEW   2. Pt will improve TUG to </= 28 secs to demonstrated reduced fall risk   Baseline: 35.65s with LBQC + CGA   Goal status: NEW   3. Patient will improve BBS score to >/= 35/56 to demonstrate improved balance    Baseline: 26/56    Goal status: NEW   4. Pt will improve gait speed to >/= .70m/s to demonstrate improved community ambulation   Baseline: .8m/s    Goal status: NEW    ASSESSMENT:  CLINICAL IMPRESSION: Symptoms and elevated BP persist throughout session suggesting stable abnormality with light activity.  He has a PCP appt tomorrow that he verbalizes awareness of with PT reinforcing importance of maintaining appt.  Session limited to initiating review and modification of HEP with vitals monitored throughout.  Pt exiting clinic in similar state to when he entered without acute progression of issues.  PT to continue POC as medical stability allows.  OBJECTIVE IMPAIRMENTS: Abnormal gait, decreased activity tolerance, decreased balance, decreased coordination, decreased endurance, decreased knowledge of condition, decreased knowledge of use of DME, difficulty walking, decreased strength, impaired perceived functional ability, impaired tone, impaired UE functional use, postural dysfunction, and pain.   ACTIVITY LIMITATIONS: carrying, lifting, bending, standing, squatting, stairs, bathing, dressing, hygiene/grooming, locomotion level, and caring for others  PARTICIPATION LIMITATIONS: meal prep, cleaning, interpersonal relationship, driving, shopping, community activity, and occupation  PERSONAL FACTORS: Behavior pattern, Fitness, Past/current experiences, Time since onset of injury/illness/exacerbation, Transportation, and 3+ comorbidities: see above  are also affecting patient's functional outcome.    REHAB POTENTIAL: Fair time since onset  CLINICAL DECISION MAKING: Evolving/moderate complexity  EVALUATION COMPLEXITY: Moderate  PLAN:  PT FREQUENCY: 1x/week 2x a week (re-cert)  PT DURATION: 2 weeks 4 weeks (re-cert)  PLANNED INTERVENTIONS: Therapeutic exercises, Therapeutic activity, Neuromuscular re-education, Balance training, Gait training, Patient/Family education, Self Care, Joint mobilization, Stair training, Vestibular training, Visual/preceptual remediation/compensation, Orthotic/Fit training, DME instructions, Aquatic Therapy, Electrical stimulation, Taping, Manual therapy, and Re-evaluation  PLAN FOR NEXT SESSION: CHECK BP, review HEP-compare seated to supine heel slides-active assist?, L hemi NMR, gait training, endurance  Sadie Haber, PT, DPT 03/14/2023, 3:36 PM

## 2023-03-14 NOTE — Patient Instructions (Signed)
Reprinted for pt on 03/14/2023!  Access Code: [email or web address] URL: [email or web address] Date: 03/14/2023 Prepared by: Camille Bal  Exercises - Supine Bridge  - 2 x daily - 5 x weekly - 2 sets - 8 reps - Supine March  - 1 x daily - 7 x weekly - 3 sets - 10 reps - Supine Heel Slide  - 2 x daily - 5 x weekly - 2 sets - 8 reps - Seated Heel Slide  - 1 x daily - 5 x weekly - 2 sets - 8 reps - Seated Hamstring Stretch  - 3 x daily - 7 x weekly - 1 sets - 3 reps - 30 sec hold - Seated Gastroc Stretch with Strap  - 3 x daily - 7 x weekly - 1 sets - 3 reps - 30 sec hold - Side to Side Weight Shift with Counter Support  - 2 x daily - 7 x weekly - 2 sets - 10 reps - Hip flexion/extension with resistance  - 3 x daily - 7 x weekly - 1 sets - 4 reps - 30 sec hold - Supine Hamstring Stretch with Caregiver  - 2 x daily - 5 x weekly - 1 sets - 3 reps - 30 sec hold - Supine Ankle Dorsiflexion Stretch with Caregiver  - 2 x daily - 5 x weekly - 1 sets - 3 reps - 30 sec hold - Staggered Sit-to-Stand  - 1 x daily - 5 x weekly - 3 sets - 5 reps

## 2023-03-14 NOTE — Patient Instructions (Signed)
Your Splint This splint should initially be fitted by a healthcare practitioner.  The healthcare practitioner is responsible for providing wearing instructions and precautions to the patient, other healthcare practitioners and care provider involved in the patient's care.  This splint was custom made for you. Please read the following instructions to learn about wearing and caring for your splint.  Precautions Should your splint cause any of the following problems, remove the splint immediately and contact your therapist/physician. Swelling Severe Pain Pressure Areas Stiffness Numbness  Do not wear your splint while operating machinery unless it has been fabricated for that purpose.  When To Wear Your Splint Where your splint according to your therapist/physician instructions. Daytime for 1 hours, then remove and monitor skin. Gradually increase wearing time by an hour over the next few days  Care and Cleaning of Your Splint Keep your splint away from open flames and heat sources including a car on a hot day. Your splint will lose its shape in temperatures over 135 degrees Farenheit, ( in car windows, near radiators, ovens or in hot water).  Never make any adjustments to your splint, if the splint needs adjusting remove it and make an appointment to see your therapist. Your splint may be cleaned with rubbing alcohol.  Do not immerse in hot water over 135 degrees Farenheit.

## 2023-03-14 NOTE — Therapy (Signed)
OUTPATIENT OCCUPATIONAL THERAPY NEURO TREATMENT  Patient Name: Derek [name] MRN: [id number] DOB:10-19-1976, 47 y.o., male Today's Date: 03/14/2023  PCP: Aliene Beams, MD REFERRING PROVIDER: Ranelle Oyster, MD  END OF SESSION:  OT End of Session - 03/14/23 1407     Visit Number 2    Number of Visits 13    Date for OT Re-Evaluation 04/22/23    Authorization Type [employer] M[employer] primary (Form submitted), MCD secondary    OT Start Time 1405    OT Stop Time 1445    OT Time Calculation (min) 40 min    Activity Tolerance Patient tolerated treatment well    Behavior During Therapy Flat affect;WFL for tasks assessed/performed             Past Medical History:  Diagnosis Date   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/07/2020   Alcohol dependence (HCC)    Back pain    Marijuana dependence (HCC)    Tobacco dependence    Past Surgical History:  Procedure Laterality Date   BUBBLE STUDY  01/10/2020   Procedure: BUBBLE STUDY;  Surgeon: Sande Rives, MD;  Location: [facility] ENDOSCOPY;  Service: Cardiovascular;;   TEE WITHOUT CARDIOVERSION N/A 01/10/2020   Procedure: TRANSESOPHAGEAL ECHOCARDIOGRAM (TEE);  Surgeon: Sande Rives, MD;  Location: [facility] ENDOSCOPY;  Service: Cardiovascular;  Laterality: N/A;   Patient Active Problem List   Diagnosis Date Noted   Moderate major depression, single episode (HCC) 04/14/2022   Arteriosclerosis of carotid artery 02/01/2022   Essential hypertension 02/01/2022   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) 02/01/2022   History of substance abuse (HCC) 02/01/2022   Iron deficiency anemia 02/01/2022   Late effects of cerebrovascular disease 02/01/2022   Obesity 02/01/2022   Pure hypercholesterolemia 02/01/2022   Pain due to onychomycosis of toenails of both feet 06/10/2021   Neuropathic pain 04/09/2020   Pain    Spastic hemiparesis (HCC)    History of hypertension    Dyslipidemia    Right middle  cerebral artery stroke (HCC) 01/15/2020   Leukocytosis 01/12/2020   Cerebrovascular accident (CVA) due to thrombosis of right middle cerebral artery (HCC)    Chronic pain syndrome    Hypokalemia    Dysphagia, post-stroke    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/07/2020   Tobacco dependence    Marijuana dependence (HCC)    Alcohol dependence (HCC)     ONSET DATE: 02/09/2023 (referral date)   REFERRING DIAG: G81.10 (ICD-10-CM) - Spastic hemiparesis (HCC)  DX: spasticity LUE s/p botox pecs and finger flexors on 02/09/23 RX: Eval and treat, spastiicty mgt, rom  THERAPY DIAG:  Spastic hemiplegia affecting nondominant side (HCC)  Rationale for Evaluation and Treatment: Rehabilitation  SUBJECTIVE:   SUBJECTIVE STATEMENT: My shoulder hurts a little Pt accompanied by: self  PERTINENT HISTORY: Spastic L Hemiparesis. PMH: R MCA CVA, Alcohol Dependence, Marijuana/Tobacco Dependence, Back Pain  PRECAUTIONS: Other: no driving  WEIGHT BEARING RESTRICTIONS: No  PAIN:  Are you having pain? Yes: NPRS scale: 4/10 Pain location: LUE st shoulder Pain description: sore Aggravating factors: laying on it Relieving factors: stretches, pain meds  FALLS: Has patient fallen in last 6 months? No  LIVING ENVIRONMENT: Lives with:  roommate Lives in: House/apartment, 8 steps to enter, 1 level home Has following equipment at home: Quad cane small base, Wheelchair (manual), Shower bench, and bed side commode   PLOF: Independent prior to stroke 2021, on disability  PATIENT GOALS: Improve Lt arm and  balance  OBJECTIVE:   HAND DOMINANCE: Right  ADLs: Overall ADLs: Has aide M-Sun 2-4 hrs/day to assist with bathing, and some dressing, cooking and cleaning Transfers/ambulation related to ADLs: mod I w/ quad cane Eating: mod I eating, difficulty cutting food and occasional assist from aide Grooming: mod I brushing teeth and shaving UB Dressing: mod I for pull over shirt, assist for button up  shirt LB Dressing: Aide assist getting pants over feet and assist for donning shoes and socks Toileting: mod I Bathing: max assist from aide Tub Shower transfers: min assist for leg management with tub bench Equipment: Transfer tub bench, Grab bars, and hand held shower  IADLs: Shopping: has groceries delivered [employer]: aide does Meal Prep: aide or roommate does but can get snack or heat up something in [employer] mobility: relies on friends Medication management: independent Landscape architect: roommate does Handwriting:  denies change  MOBILITY STATUS:  walks with quad cane in house and short distances   UPPER EXTREMITY ROM:  RUE AROM WNL's LUE: Limited active movement - sh shrug w/ slight elbow flex dominated by synergy pattern  Passive ROM Right eval Left eval  Shoulder flexion  110*  Shoulder abduction  80*  Shoulder adduction    Shoulder extension    Shoulder internal rotation    Shoulder external rotation    Elbow flexion  90%  Elbow extension  90%  Wrist flexion  90%  Wrist extension  75%  Wrist ulnar deviation    Wrist radial deviation    Wrist pronation    Wrist supination  90%  (Blank rows = not tested) Passive finger extension full with wrist flexion. Difficulty at PIP joint extension, especially LF - at rest, PIP flexed, DIP hyperextended  HAND FUNCTION: No function LT HAND  COORDINATION: No function Lt hand  SENSATION: Significantly diminished - cannot detect location  EDEMA: minimal Lt hand  MUSCLE TONE: LUE: Moderate, Hypertonic, and Modifed Ashworth Scale 2 = More marked increase in muscle tone through most of the ROM, but affected part(s) easily moved  COGNITION: Overall cognitive status:  pt reports memory deficits  VISION: Subjective report: I need glasses but no changes in vision from stroke  Visual history:  none  PERCEPTION: Not tested  PRAXIS: Not tested  OBSERVATIONS: flat affect   TODAY'S TREATMENT:                                                                                                                                Fabricated and fitted resting hand splint and issued. Reviewed wear and care of splint and had pt practice donning/doffing w/ cues to don properly   PATIENT EDUCATION: Education details: splint wear and care Person educated: Patient Education method: Explanation, demo, handout Education comprehension: verbalized understanding, return demo  HOME EXERCISE PROGRAM: 03/14/23: splint wear and care   GOALS: Goals reviewed with patient? Yes  SHORT TERM GOALS: Target date: 04/01/23  Independent  with wear and care resting hand splint for LUE Baseline: Goal status: IN PROGRESS  2.  Independent HEP for LUE self stretches  Baseline:  Goal status: INITIAL  3.  Pt to verbalize understanding with memory compensation strategies Baseline:  Goal status: INITIAL   LONG TERM GOALS: Target date: 04/22/23  Pt to verbalize understanding with potential A/E needs for one handed use to increase independence with ADLS (rocker knife, shoe buttons, LH sponge, etc)  Baseline:  Goal status: INITIAL  2.  Pt to cut food I'ly with A/E Baseline:  Goal status: INITIAL  3.  Pt to consistently perform all UB dressing except buttons Baseline:  Goal status: INITIAL  4.  Pt to perform LB dressing w/ no more than min assist with A/E prn Baseline:  Goal status: INITIAL   ASSESSMENT:  CLINICAL IMPRESSION: Patient returns today for first treatment for splint fabrication - tolerating well in clinic  PERFORMANCE DEFICITS: in functional skills including ADLs, IADLs, coordination, dexterity, sensation, tone, ROM, strength, pain, flexibility, mobility, body mechanics, decreased knowledge of use of DME, and UE functional use, cognitive skills including memory, and psychosocial skills including coping strategies.   IMPAIRMENTS: are limiting patient from ADLs, IADLs, and rest and sleep.    CO-MORBIDITIES: may have co-morbidities  that affects occupational performance. Patient will benefit from skilled OT to address above impairments and improve overall function.  MODIFICATION OR ASSISTANCE TO COMPLETE EVALUATION: No modification of tasks or assist necessary to complete an evaluation.  OT OCCUPATIONAL PROFILE AND HISTORY: Problem focused assessment: Including review of records relating to presenting problem.  CLINICAL DECISION MAKING: Moderate - several treatment options, min-mod task modification necessary  REHAB POTENTIAL: Good  EVALUATION COMPLEXITY: Low    PLAN:  OT FREQUENCY: 2x/week  OT DURATION: 6 weeks (plus evaluation)  PLANNED INTERVENTIONS: self care/ADL training, therapeutic exercise, therapeutic activity, neuromuscular re-education, manual therapy, passive range of motion, functional mobility training, splinting, moist heat, patient/family education, cognitive remediation/compensation, coping strategies training, and DME and/or AE instructions  RECOMMENDED OTHER SERVICES: none at this time - currently seeing P.T.   CONSULTED AND AGREED WITH PLAN OF CARE: Patient  PLAN FOR NEXT SESSION: splint adjustments prn, monitor BP, issue self stretch HEP    Sheran Lawless, OT 03/14/2023, 2:08 PM

## 2023-03-15 DIAGNOSIS — I6930 Unspecified sequelae of cerebral infarction: Secondary | ICD-10-CM | POA: Diagnosis not present

## 2023-03-15 DIAGNOSIS — I6529 Occlusion and stenosis of unspecified carotid artery: Secondary | ICD-10-CM | POA: Diagnosis not present

## 2023-03-15 DIAGNOSIS — Z0000 Encounter for general adult medical examination without abnormal findings: Secondary | ICD-10-CM | POA: Diagnosis not present

## 2023-03-15 DIAGNOSIS — Z720 Tobacco use: Secondary | ICD-10-CM | POA: Diagnosis not present

## 2023-03-15 DIAGNOSIS — I10 Essential (primary) hypertension: Secondary | ICD-10-CM | POA: Diagnosis not present

## 2023-03-15 DIAGNOSIS — F1290 Cannabis use, unspecified, uncomplicated: Secondary | ICD-10-CM | POA: Diagnosis not present

## 2023-03-17 ENCOUNTER — Encounter: Payer: Self-pay | Admitting: Physical Therapy

## 2023-03-17 ENCOUNTER — Telehealth: Payer: Self-pay | Admitting: Physical Therapy

## 2023-03-17 ENCOUNTER — Ambulatory Visit: Payer: Medicare HMO | Admitting: Physical Therapy

## 2023-03-17 VITALS — BP 158/101 | HR 68

## 2023-03-17 DIAGNOSIS — R2689 Other abnormalities of gait and mobility: Secondary | ICD-10-CM

## 2023-03-17 DIAGNOSIS — R2681 Unsteadiness on feet: Secondary | ICD-10-CM

## 2023-03-17 DIAGNOSIS — M6281 Muscle weakness (generalized): Secondary | ICD-10-CM

## 2023-03-17 DIAGNOSIS — G8110 Spastic hemiplegia affecting unspecified side: Secondary | ICD-10-CM

## 2023-03-17 NOTE — Telephone Encounter (Signed)
Called [facility] office, patient's PCP, to inform of BP findings in today's session and explaining that we are going on medical hold until BP readings in safe range. Left message with front line.  Maryruth Eve, PT, DPT

## 2023-03-17 NOTE — Therapy (Signed)
[facility] [address] [place], [place], [address] Phone: [phone]   Fax:  [phone]  Patient Details  Name: Derek [name] MRN: [id number] Date of Birth: 28-Oct-1976 Referring Provider:  Ranelle Oyster, MD  Encounter Date: 03/17/2023  Session is arrive no charge. BP continues to be out of safe therapeutic range given diastolic value. Patient reports that his PCP did not change any BP medications when he went in this week as he had not taken his medication prior to going to the appointment and therefore they were not able to get an accurate picture as to how he was responding to current medication. He is scheduled to go back in 2 weeks. Patient states he has taken his BP medication this morning. Patient denies falls/near falls. Patient is asymptomatic. Recommending patient call PCP as levels remain elevated. Patient informed that he will go on medical hold until he follows up with PCP to get BP under control. Patient verbalized understanding and provided a business card to call back.   Vitals:   03/17/23 1408 03/17/23 1411  BP: (!) 151/106 (!) 158/101  Pulse: 66 68   Carmelia Bake, PT, DPT 03/17/2023, 2:22 PM  [facility] [address] [place], [place], [address] Phone: [phone]   Fax:  [phone]

## 2023-03-21 ENCOUNTER — Ambulatory Visit: Payer: Medicare HMO | Admitting: Physical Therapy

## 2023-03-21 ENCOUNTER — Ambulatory Visit: Payer: Medicare HMO | Admitting: Occupational Therapy

## 2023-03-24 ENCOUNTER — Ambulatory Visit: Payer: Medicare HMO | Admitting: Physical Therapy

## 2023-03-24 ENCOUNTER — Ambulatory Visit: Payer: Medicare HMO

## 2023-03-29 ENCOUNTER — Other Ambulatory Visit: Payer: Self-pay | Admitting: Physical Medicine & Rehabilitation

## 2023-03-29 ENCOUNTER — Encounter: Payer: Self-pay | Admitting: Podiatry

## 2023-03-29 ENCOUNTER — Ambulatory Visit (INDEPENDENT_AMBULATORY_CARE_PROVIDER_SITE_OTHER): Payer: Medicare HMO | Admitting: Podiatry

## 2023-03-29 DIAGNOSIS — M79674 Pain in right toe(s): Secondary | ICD-10-CM | POA: Diagnosis not present

## 2023-03-29 DIAGNOSIS — M79675 Pain in left toe(s): Secondary | ICD-10-CM

## 2023-03-29 DIAGNOSIS — M792 Neuralgia and neuritis, unspecified: Secondary | ICD-10-CM

## 2023-03-29 DIAGNOSIS — B351 Tinea unguium: Secondary | ICD-10-CM

## 2023-03-29 NOTE — Progress Notes (Signed)
This patient presents to the office for evaluation and treatment of long thick painful nails .  This patient is unable to trim his own nails since the patient cannot reach his feet.  Patient says the nails are painful walking and wearing his shoes.  Patient has history of CVA .   He returns for preventive foot care services.  General Appearance  Alert, conversant and in no acute stress.  Vascular  Dorsalis pedis and posterior tibial  pulses are palpable  bilaterally.  Capillary return is within normal limits  bilaterally. Temperature is within normal limits  bilaterally.  Neurologic  Senn-Weinstein monofilament wire test within normal limits  bilaterally. Muscle power within normal limits bilaterally.  Nails Thick disfigured discolored nails with subungual debris  from hallux to fifth toes bilaterally. No evidence of bacterial infection or drainage bilaterally.  Orthopedic  No limitations of motion  feet .  No crepitus or effusions noted.  No bony pathology or digital deformities noted.  Skin  normotropic skin with no porokeratosis noted bilaterally.  No signs of infections or ulcers noted.     Onychomycosis  Pain in toes right foot  Pain in toes left foot  Debridement  of nails  1-5  B/L with a nail nipper.  Nails were then filed using a dremel tool with no incidents.    RTC  12   weeks    Kadajah Kjos DPM   

## 2023-03-30 ENCOUNTER — Ambulatory Visit: Payer: Medicare HMO

## 2023-03-31 DIAGNOSIS — Z9989 Dependence on other enabling machines and devices: Secondary | ICD-10-CM | POA: Diagnosis not present

## 2023-03-31 DIAGNOSIS — F1290 Cannabis use, unspecified, uncomplicated: Secondary | ICD-10-CM | POA: Diagnosis not present

## 2023-03-31 DIAGNOSIS — I6930 Unspecified sequelae of cerebral infarction: Secondary | ICD-10-CM | POA: Diagnosis not present

## 2023-03-31 DIAGNOSIS — I69354 Hemiplegia and hemiparesis following cerebral infarction affecting left non-dominant side: Secondary | ICD-10-CM | POA: Diagnosis not present

## 2023-03-31 DIAGNOSIS — Z720 Tobacco use: Secondary | ICD-10-CM | POA: Diagnosis not present

## 2023-03-31 DIAGNOSIS — R7301 Impaired fasting glucose: Secondary | ICD-10-CM | POA: Diagnosis not present

## 2023-03-31 DIAGNOSIS — I10 Essential (primary) hypertension: Secondary | ICD-10-CM | POA: Diagnosis not present

## 2023-03-31 DIAGNOSIS — F321 Major depressive disorder, single episode, moderate: Secondary | ICD-10-CM | POA: Diagnosis not present

## 2023-04-01 ENCOUNTER — Ambulatory Visit: Payer: Medicare HMO | Admitting: Physical Therapy

## 2023-04-06 ENCOUNTER — Ambulatory Visit: Payer: Medicare HMO

## 2023-04-27 ENCOUNTER — Encounter: Payer: Medicare HMO | Admitting: Occupational Therapy

## 2023-05-02 ENCOUNTER — Telehealth: Payer: Self-pay | Admitting: *Deleted

## 2023-05-02 DIAGNOSIS — I63511 Cerebral infarction due to unspecified occlusion or stenosis of right middle cerebral artery: Secondary | ICD-10-CM

## 2023-05-02 DIAGNOSIS — G8110 Spastic hemiplegia affecting unspecified side: Secondary | ICD-10-CM

## 2023-05-02 NOTE — Telephone Encounter (Signed)
Derek [name] called and states he needs a new referral [facility] OT. His has expired.

## 2023-05-03 NOTE — Telephone Encounter (Signed)
Thanks. New orders for pt/ot in at [facility]

## 2023-05-03 NOTE — Telephone Encounter (Signed)
Yes he went to his doctor and it is better now.

## 2023-05-03 NOTE — Telephone Encounter (Signed)
Therapy was halted due to extremely high BP's. Is his blood pressure under better control now?

## 2023-05-18 ENCOUNTER — Encounter: Payer: Self-pay | Admitting: Physical Medicine & Rehabilitation

## 2023-05-18 ENCOUNTER — Encounter: Payer: Medicare HMO | Attending: Physical Medicine & Rehabilitation | Admitting: Physical Medicine & Rehabilitation

## 2023-05-18 VITALS — BP 130/91 | HR 77 | Ht 71.0 in | Wt 223.6 lb

## 2023-05-18 DIAGNOSIS — G8110 Spastic hemiplegia affecting unspecified side: Secondary | ICD-10-CM

## 2023-05-18 DIAGNOSIS — I10 Essential (primary) hypertension: Secondary | ICD-10-CM

## 2023-05-18 DIAGNOSIS — M792 Neuralgia and neuritis, unspecified: Secondary | ICD-10-CM | POA: Diagnosis not present

## 2023-05-18 DIAGNOSIS — I63511 Cerebral infarction due to unspecified occlusion or stenosis of right middle cerebral artery: Secondary | ICD-10-CM | POA: Diagnosis not present

## 2023-05-18 MED ORDER — DULOXETINE HCL 60 MG PO CPEP: 60.0000 mg | ORAL_CAPSULE | Freq: Every day | ORAL | 5 refills | Status: AC

## 2023-05-18 NOTE — Progress Notes (Signed)
Subjective:    Patient ID: Derek [name], male    DOB: Nov 21, 1975, 47 y.o.   MRN: [id number]  HPI  Derek [name] is here in follow up regarding his right CVA and left hemiparesis. We did xeomin injections in April when I last saw him.  He states that the injections did help to improve his shoulder range of motion and arm range of motion in general.He has continued pain in his left shoulder and hip.  However his shoulder pain was better while his pectoralis muscles were looser.  His left hip will bother him when he is walking more.  Is also fairly tight in the morning when he first wakes up.  He is doing stretching exercises in general but is limited as he has no one to really help him do these.  Therapy came to a halt because his blood pressure was out of control.  I recently wrote orders to resume PT and OT.  Derek [name] remains on baclofen and tizanidine 4 times daily as scheduled.  Patient reports that he can be anxious at times and when anxiety builds up his blood pressure tends to increase as well.  He has noticed that on occasion with therapy.  He is on Cymbalta 30 mg daily.   Pain Inventory Average Pain 6 Pain Right Now 6 My pain is intermittent and aching  In the last 24 hours, has pain interfered with the following? General activity 5 Relation with others 5 Enjoyment of life 5 What TIME of day is your pain at its worst? varies Sleep (in general) Poor  Pain is worse with: standing, unsure, and some activites Pain improves with: rest and medication Relief from Meds: 9  Family History  Problem Relation Age of Onset   Stroke Neg Hx    CAD Neg Hx    Social History   Socioeconomic History   Marital status: Married    Spouse name: Not on file   Number of children: Not on file   Years of education: Not on file   Highest education level: Not on file  Occupational History   Occupation: truck driver  Tobacco Use   Smoking status: Every Day    Current packs/day: 0.20    Types: Cigarettes    Smokeless tobacco: Never  Vaping Use   Vaping status: Never Used  Substance and Sexual Activity   Alcohol use: Yes    Comment: 2-3 times a week.    Drug use: Not Currently    Types: Marijuana   Sexual activity: Not Currently    Birth control/protection: Condom  Other Topics Concern   Not on file  Social History Narrative   Not on file   Social Determinants of Health   Financial Resource Strain: Not on file  Food Insecurity: Not on file  Transportation Needs: Not on file  Physical Activity: Not on file  Stress: Not on file  Social Connections: Not on file   Past Surgical History:  Procedure Laterality Date   BUBBLE STUDY  01/10/2020   Procedure: BUBBLE STUDY;  Surgeon: Sande Rives, MD;  Location: [facility] ENDOSCOPY;  Service: Cardiovascular;;   TEE WITHOUT CARDIOVERSION N/A 01/10/2020   Procedure: TRANSESOPHAGEAL ECHOCARDIOGRAM (TEE);  Surgeon: Sande Rives, MD;  Location: [facility] ENDOSCOPY;  Service: Cardiovascular;  Laterality: N/A;   Past Surgical History:  Procedure Laterality Date   BUBBLE STUDY  01/10/2020   Procedure: BUBBLE STUDY;  Surgeon: Sande Rives, MD;  Location: [facility] ENDOSCOPY;  Service: Cardiovascular;;  TEE WITHOUT CARDIOVERSION N/A 01/10/2020   Procedure: TRANSESOPHAGEAL ECHOCARDIOGRAM (TEE);  Surgeon: Sande Rives, MD;  Location: [facility] ENDOSCOPY;  Service: Cardiovascular;  Laterality: N/A;   Past Medical History:  Diagnosis Date   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/07/2020   Alcohol dependence (HCC)    Back pain    Marijuana dependence (HCC)    Tobacco dependence    BP (!) 130/91   Pulse 77   Ht 5\' 11"  (1.803 m)   Wt 223 lb 9.6 oz (101.4 kg)   SpO2 96%   BMI 31.19 kg/m   Opioid Risk Score:   Fall Risk Score:  `1  Depression screen [facility] 2/9     05/18/2023   11:58 AM 02/09/2023   10:36 AM 06/09/2022    1:22 PM 11/11/2021    1:08 PM 08/05/2021   10:32 AM 05/13/2021   10:32 AM 06/18/2020   11:23 AM  Depression screen  PHQ 2/9  Decreased Interest 1 1 0 2 1 1  0  Down, Depressed, Hopeless 1 1 0 2 1 1  0  PHQ - 2 Score 2 2 0 4 2 2  0      Review of Systems  Constitutional: Negative.   HENT: Negative.    Eyes: Negative.   Respiratory: Negative.    Cardiovascular: Negative.   Gastrointestinal: Negative.   Endocrine: Negative.   Genitourinary: Negative.   Musculoskeletal:  Positive for gait problem.       Shoulder and hip left side  Skin: Negative.   Allergic/Immunologic: Negative.   Hematological: Negative.   Psychiatric/Behavioral:  Positive for dysphoric mood.   All other systems reviewed and are negative.      Objective:   Physical Exam  General: No acute distress HEENT: NCAT, EOMI, oral membranes moist Cards: reg rate  Chest: normal effort Abdomen: Soft, NT, ND Skin: dry, intact Extremities: no edema Psych: pleasant and appropriate  Neuro: Left upper extremity remains 1 out of 5 with delotids and tr to 1 at biceps, 0/5 distal.  Left lower extremity is 3 out of 5 hip flexion 3 out of 5 knee extension 0 to trace at the ankle--no changes.  decreased LT LUE and LLE ongoing .  2+ out of 4 tone left pec and shoulder girdle musculature.  Biceps 1 out of 4. finger flexors and wrist flexors 1 out of 4.  Quads are 1/4 gastrocs 1 out of 4.  Reflexes are 3+..  no AFO again today..   walks with left hip hike once again so clear his left leg  Musc: mild pain in left low back/iliac crest area. Left shoulder rom limited d/t tone in pecs. Pain with abduction. 1/2' SHOULDER SUBLUX           Assessment & Plan:  1.Right Middle Cerebral Artery Stroke:             -resume therapy at neuro-rehab  -reassess gait mechanics, cane, AFO 2. Spastic Hemiparesis: Continue Baclofen              -tizanidine and baclofen. Continue at 4 times daily each             -Repeat xeomin 400 u to left pecs, elbows, ?wrist and fingers             -reviewed pec stretches today--he'll try his best. He has nobody to help with  stretching.              -OT will address as outpt 3.History of  Hypertension: PCP Following. Reviewed diet/causes of elevated BP 4.Dyslipidemia: Lipitor refilled 5.left shoulder pain consistent with hemiplegic shoulder.  Continue Lyrica             --hydrocodone by primary  -reviewed shoulder stretches  -xeomin to pecs 6. Anxiety/depression  -increase cymbalta to 60mg  daily   -also may help pain   20 minutes of face to face patient care time were spent during this visit. All questions were encouraged and answered.  Follow up with me in 1-2 months for xeomin

## 2023-05-18 NOTE — Patient Instructions (Addendum)
ALWAYS FEEL FREE TO CALL OUR OFFICE WITH ANY PROBLEMS OR QUESTIONS [phone])  **PLEASE NOTE** ALL MEDICATION REFILL REQUESTS (INCLUDING CONTROLLED SUBSTANCES) NEED TO BE MADE AT LEAST 7 DAYS PRIOR TO REFILL BEING DUE. ANY REFILL REQUESTS INSIDE THAT TIME FRAME MAY RESULT IN DELAYS IN RECEIVING YOUR PRESCRIPTION.    MAKE SURE THERAPY WORKS ON BETTER CLEARANCE OF YOUR RIGHT LEG   TRY TO STRETCH YOUR LEFT SHOULDER AWAY/UP FROM YOUR BODY.    TAKE YOUR BP MEDS BEFORE YOU GO TO THERAPY   WE'RE INCREASING CYMBALTA TO 60MG  FOR PAIN AND ANXIETY

## 2023-05-23 ENCOUNTER — Other Ambulatory Visit: Payer: Self-pay | Admitting: Physical Medicine & Rehabilitation

## 2023-05-25 ENCOUNTER — Encounter: Payer: Self-pay | Admitting: Physical Therapy

## 2023-05-25 ENCOUNTER — Ambulatory Visit: Payer: Medicare HMO | Admitting: Physical Therapy

## 2023-05-25 ENCOUNTER — Encounter: Payer: Self-pay | Admitting: Occupational Therapy

## 2023-05-25 ENCOUNTER — Ambulatory Visit: Payer: Medicare HMO | Attending: Physical Medicine & Rehabilitation | Admitting: Occupational Therapy

## 2023-05-25 ENCOUNTER — Other Ambulatory Visit: Payer: Self-pay

## 2023-05-25 VITALS — BP 138/93 | HR 53

## 2023-05-25 DIAGNOSIS — M24542 Contracture, left hand: Secondary | ICD-10-CM | POA: Insufficient documentation

## 2023-05-25 DIAGNOSIS — M6281 Muscle weakness (generalized): Secondary | ICD-10-CM | POA: Diagnosis not present

## 2023-05-25 DIAGNOSIS — R2681 Unsteadiness on feet: Secondary | ICD-10-CM | POA: Diagnosis not present

## 2023-05-25 DIAGNOSIS — I69354 Hemiplegia and hemiparesis following cerebral infarction affecting left non-dominant side: Secondary | ICD-10-CM | POA: Insufficient documentation

## 2023-05-25 DIAGNOSIS — R41844 Frontal lobe and executive function deficit: Secondary | ICD-10-CM | POA: Insufficient documentation

## 2023-05-25 DIAGNOSIS — R2689 Other abnormalities of gait and mobility: Secondary | ICD-10-CM | POA: Diagnosis not present

## 2023-05-25 DIAGNOSIS — I63511 Cerebral infarction due to unspecified occlusion or stenosis of right middle cerebral artery: Secondary | ICD-10-CM | POA: Diagnosis not present

## 2023-05-25 DIAGNOSIS — M79602 Pain in left arm: Secondary | ICD-10-CM | POA: Diagnosis not present

## 2023-05-25 DIAGNOSIS — G8110 Spastic hemiplegia affecting unspecified side: Secondary | ICD-10-CM | POA: Diagnosis not present

## 2023-05-25 DIAGNOSIS — R208 Other disturbances of skin sensation: Secondary | ICD-10-CM | POA: Insufficient documentation

## 2023-05-25 DIAGNOSIS — R293 Abnormal posture: Secondary | ICD-10-CM

## 2023-05-25 NOTE — Therapy (Signed)
OUTPATIENT OCCUPATIONAL THERAPY NEURO EVALUATION  Patient Name: Derek [name] MRN: [id number] DOB:1976/09/04, 47 y.o., male Today's Date: 05/25/2023  PCP: Aliene Beams, MD REFERRING PROVIDER: Ranelle Oyster, MD  END OF SESSION:  OT End of Session - 05/25/23 1322     Visit Number 1    Number of Visits 12   + evaluation   Date for OT Re-Evaluation 08/12/23    Authorization Type [employer] MEDICARE HMO    Progress Note Due on Visit 10    OT Start Time 1322    OT Stop Time 1400    OT Time Calculation (min) 38 min    Equipment Utilized During Treatment Testing material    Activity Tolerance Patient tolerated treatment well    Behavior During Therapy [facility] for tasks assessed/performed             Past Medical History:  Diagnosis Date   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/07/2020   Alcohol dependence (HCC)    Back pain    Marijuana dependence (HCC)    Tobacco dependence    Past Surgical History:  Procedure Laterality Date   BUBBLE STUDY  01/10/2020   Procedure: BUBBLE STUDY;  Surgeon: Sande Rives, MD;  Location: [facility] ENDOSCOPY;  Service: Cardiovascular;;   TEE WITHOUT CARDIOVERSION N/A 01/10/2020   Procedure: TRANSESOPHAGEAL ECHOCARDIOGRAM (TEE);  Surgeon: Sande Rives, MD;  Location: [facility] ENDOSCOPY;  Service: Cardiovascular;  Laterality: N/A;   Patient Active Problem List   Diagnosis Date Noted   Moderate major depression, single episode (HCC) 04/14/2022   Arteriosclerosis of carotid artery 02/01/2022   Essential hypertension 02/01/2022   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) 02/01/2022   History of substance abuse (HCC) 02/01/2022   Iron deficiency anemia 02/01/2022   Late effects of cerebrovascular disease 02/01/2022   Obesity 02/01/2022   Pure hypercholesterolemia 02/01/2022   Pain due to onychomycosis of toenails of both feet 06/10/2021   Neuropathic pain 04/09/2020   Pain    Spastic hemiparesis (HCC)     History of hypertension    Dyslipidemia    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   Leukocytosis 01/12/2020   Cerebrovascular accident (CVA) due to thrombosis of right middle cerebral artery (HCC)    Chronic pain syndrome    Hypokalemia    Dysphagia, post-stroke    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/07/2020   Tobacco dependence    Marijuana dependence (HCC)    Alcohol dependence (HCC)     ONSET DATE: referral: 05/03/2023  REFERRING DIAG: Q65.784 (ICD-10-CM) - Right middle cerebral artery stroke (HCC) G81.10 (ICD-10-CM) - Spastic hemiparesis (HCC)  DX: spasticity LUE s/p botox pecs and finger flexors on 02/09/23 - scheduled again next month (August) DX: CVA with spastic left hemiparesis RX: Eval and treat, resume therapies, bp better controlledDX: CVA with spastic left hemiparesis RX: Eval and treat, resume therapies, bp better controlled THERAPY DIAG:  Muscle weakness (generalized)  Pain in left arm  Other disturbances of skin sensation  Frontal lobe and executive function deficit  Contracture, left hand  Rationale for Evaluation and Treatment: Rehabilitation  SUBJECTIVE:   SUBJECTIVE STATEMENT: My arm still doesn't work.  He does reported getting assistance with donning with custom resting hand splint for LUE 2-3x/week.  Pt accompanied by: self  PERTINENT HISTORY: Spastic L Hemiparesis.  PMH: R MCA CVA, Alcohol Dependence (occasional), Marijuana (NA)/Tobacco ([place]) Dependence, Back Pain  PRECAUTIONS: Other: no driving  WEIGHT BEARING RESTRICTIONS: No  PAIN:  Are you having pain? Yes: NPRS scale: 5/10 Pain location: LUE - shoulder Pain description: aching  Aggravating factors: laying on it Relieving factors: Pain patch (doesn't have rx anymore) stretches, pain meds  FALLS: Has patient fallen in last 6 months? No  LIVING ENVIRONMENT: Lives with:  roommate Lives in: House 8 steps to enter (B rails but uses R side rail going up), 1  level home Has following equipment at home: Quad cane small base, Wheelchair (manual), Shower bench, and bed side commode - over the toilet in the bathroom, urinal at night   PLOF: Independent prior to stroke 2021, on disability  PATIENT GOALS: Improve Lt arm mobility and balance  OBJECTIVE:   HAND DOMINANCE: Right  ADLs: Overall ADLs: Has aide M-Sun 2-4 hrs/day to assist with bathing, and some dressing, cooking and cleaning Transfers/ambulation related to ADLs: mod I w/ quad cane Eating: mod I eating, difficulty cutting food and occasional assist from aide Grooming: mod I brushing teeth and shaving UB Dressing: mod I for pull over shirt, assist for button up shirt LB Dressing: Aide assist getting pants over feet and assist for donning shoes and socks Toileting: mod I Bathing: max assist from aide Tub Shower transfers: min assist for leg management with tub bench Equipment: Transfer tub bench, Grab bars, and hand held shower  IADLs: Shopping: has groceries delivered [employer]: aide does Meal Prep: aide or roommate does but can get snack or heat up something in microwave after someone gets it out of the fridge etc for him Community mobility: relies on friends Medication management: independent s/p aide sorting it into his pill box Financial management: roommate does Handwriting:  denies change  MOBILITY STATUS:  walks with quad cane in house and short distances   UPPER EXTREMITY ROM:  RUE AROM WNL's  LUE: Limited active movement - sh shrug w/ slight elbow flex dominated by synergy pattern  Passive ROM Right eval Left eval  Shoulder flexion  90*  Shoulder abduction  80*  Shoulder adduction    Shoulder extension    Shoulder internal rotation    Shoulder external rotation    Elbow flexion  90%  Elbow extension  90%  Wrist flexion  90%  Wrist extension  25%  Wrist ulnar deviation    Wrist radial deviation    Wrist pronation    Wrist supination  90%  (Blank  rows = not tested)  Passive finger extension full with wrist flexion. Difficulty at PIP joint extension, especially LF - at rest, PIP flexed, DIP hyperextended  HAND FUNCTION: No function LT HAND  COORDINATION: No function Lt hand  SENSATION: Significantly diminished - cannot detect location, entire L UE is dull feeling  EDEMA: minimal Lt hand  MUSCLE TONE: LUE: Moderate, Hypertonic, and Modifed Ashworth Scale 2 = More marked increase in muscle tone through most of the ROM, but affected part(s) easily moved  COGNITION: Overall cognitive status:  pt reports memory deficits  VISION: Subjective report: still reports that he need glasses for reading.  Said he was 20/30 last time he went to the eye doctor but no changes in vision from stroke  Visual history:  none  PERCEPTION: Not tested  PRAXIS: Not tested  OBSERVATIONS: Pleasant gentlemen with somewhat flat affect and significant limitations with L UE ie) flickers of movement only noted although synergy patterns noted with yawning where L elbow flexed up to 90+ degrees.   TODAY'S TREATMENT:  N/A - eval only  PATIENT EDUCATION: Education details: OT POC Person educated: Patient Education method: Explanation Education comprehension: verbalized understanding  HOME EXERCISE PROGRAM: N/A Today   GOALS: Goals reviewed with patient? Yes  SHORT TERM GOALS: Target date:8/30//24  Independent with wear and care of resting hand splint for LUE up to daily. Baseline: 2-3x/week Goal status: INITIAL  2.  Independent HEP for LUE self stretches  Baseline: None established a OT POC 3 months ago Goal status: INITIAL  3.  Pt to verbalize understanding with memory compensation strategies ie) binder for info Baseline: Self reported memory issues and limited access to past HEPs, medication list etc. Goal status:  INITIAL   LONG TERM GOALS: Target date: 08/12/23  Pt to verbalize understanding with potential A/E needs for one handed use to increase independence with ADLS (rocker knife, shoe buttons, LH sponge, etc)  Baseline: Dependent on caregiver assistance for many aspects of ADLs Goal status: INITIAL  2.  Pt to cut food I'ly with A/E Baseline: Dependent on caregiver assistance for many aspects of ADLs Goal status: INITIAL  3.  Pt to consistently perform all UB dressing except buttons Baseline: Assisted by caregiver Goal status: INITIAL  4.  Pt to perform LB dressing w/ no more than min assist with A/E prn (tie shoelaces) Baseline: Assisted by caregiver Goal status: INITIAL   ASSESSMENT:  CLINICAL IMPRESSION: Patient is a 47 y.o. male who was seen today for occupational therapy evaluation for spastic hemiplegia Lt non dominant side from stroke in 2021. Pt received botox injections to LUE on 02/09/23 to pects and finger flexors and anticipates additional injections next month.  He was to recent OT s/p previous injections but BP issues limited attendance to therapy.  He would benefit from resuming O.T. for developing stretching HEP and resting hand splint following injections, as well as increased participation in ADLS with memory compensation with binder to compile information for carryover up on completion of OT.   PERFORMANCE DEFICITS: in functional skills including ADLs, IADLs, coordination, dexterity, sensation, tone, ROM, strength, pain, flexibility, Fine motor control, Gross motor control, mobility, body mechanics, decreased knowledge of use of DME, and UE functional use, cognitive skills including memory, and psychosocial skills including coping strategies.   IMPAIRMENTS: are limiting patient from ADLs, IADLs, and rest and sleep.   CO-MORBIDITIES: may have co-morbidities  that affects occupational performance. Patient will benefit from skilled OT to address above impairments and improve  overall function.  MODIFICATION OR ASSISTANCE TO COMPLETE EVALUATION: No modification of tasks or assist necessary to complete an evaluation.  OT OCCUPATIONAL PROFILE AND HISTORY: Problem focused assessment: Including review of records relating to presenting problem.  CLINICAL DECISION MAKING: LOW - limited treatment options, no task modification necessary  REHAB POTENTIAL: Good for established goals  EVALUATION COMPLEXITY: Low    PLAN:  OT FREQUENCY: 2x/week  OT DURATION: 6 weeks (plus evaluation)  PLANNED INTERVENTIONS: self care/ADL training, therapeutic exercise, therapeutic activity, neuromuscular re-education, manual therapy, passive range of motion, functional mobility training, splinting, moist heat, patient/family education, cognitive remediation/compensation, coping strategies training, and DME and/or AE instructions  RECOMMENDED OTHER SERVICES: P.T evaluating patient today also.   CONSULTED AND AGREED WITH PLAN OF CARE: Patient  PLAN FOR NEXT SESSION:  Check resting hand splint (may need to call to remind to bring)  Begin folder/binder for collection of info needed to monitor BP, list medications, collect AE info, combine HEP ideas.  Print self PROM exercises for LUE.   Victorino Sparrow,  OT 05/25/2023, 8:14 PM

## 2023-05-25 NOTE — Therapy (Signed)
OUTPATIENT PHYSICAL THERAPY NEURO EVALUATION   Patient Name: Derek [name] MRN: [id number] DOB:[birth year], 47 y.o., male Today's Date: 05/25/2023   PCP: Aliene Beams, MD REFERRING PROVIDER: Ranelle Oyster, MD  END OF SESSION:  05/25/23 1412  PT Visits / Re-Eval  Visit Number 1  Number of Visits 9 (8 + eval)  Date for PT Re-Evaluation 08/05/23 (pushed out due to multi-D scheduling delay)  Authorization  Authorization Type [employer]ICARE  Progress Note Due on Visit 10  PT Time Calculation  PT Start Time 1403 (handoff from OT)  PT Stop Time 1446  PT Time Calculation (min) 43 min  PT - End of Session  Equipment Utilized During Treatment Gait belt  Behavior During Therapy [facility] for tasks assessed/performed;Flat affect   Past Medical History:  Diagnosis Date   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/07/2020   Alcohol dependence (HCC)    Back pain    Marijuana dependence (HCC)    Tobacco dependence    Past Surgical History:  Procedure Laterality Date   BUBBLE STUDY  01/10/2020   Procedure: BUBBLE STUDY;  Surgeon: Sande Rives, MD;  Location: [facility] ENDOSCOPY;  Service: Cardiovascular;;   TEE WITHOUT CARDIOVERSION N/A 01/10/2020   Procedure: TRANSESOPHAGEAL ECHOCARDIOGRAM (TEE);  Surgeon: Sande Rives, MD;  Location: [facility] ENDOSCOPY;  Service: Cardiovascular;  Laterality: N/A;   Patient Active Problem List   Diagnosis Date Noted   Moderate major depression, single episode (HCC) 04/14/2022   Arteriosclerosis of carotid artery 02/01/2022   Essential hypertension 02/01/2022   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) 02/01/2022   History of substance abuse (HCC) 02/01/2022   Iron deficiency anemia 02/01/2022   Late effects of cerebrovascular disease 02/01/2022   Obesity 02/01/2022   Pure hypercholesterolemia 02/01/2022   Pain due to onychomycosis of toenails of both feet 06/10/2021   Neuropathic pain 04/09/2020   Pain     Spastic hemiparesis (HCC)    History of hypertension    Dyslipidemia    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   Leukocytosis 01/12/2020   Cerebrovascular accident (CVA) due to thrombosis of right middle cerebral artery (HCC)    Chronic pain syndrome    Hypokalemia    Dysphagia, post-stroke    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/07/2020   Tobacco dependence    Marijuana dependence (HCC)    Alcohol dependence (HCC)     ONSET DATE: 2021 (CVA)  REFERRING DIAG: I63.511 (ICD-10-CM) - Right middle cerebral artery stroke (HCC) G81.10 (ICD-10-CM) - Spastic hemiparesis (HCC)  THERAPY DIAG:  Spastic hemiplegia of left nondominant 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 (HCC)  Abnormal posture  Other abnormalities of gait and mobility  Unsteadiness on feet  Muscle weakness (generalized)  Rationale for Evaluation and Treatment: Rehabilitation  SUBJECTIVE:  SUBJECTIVE STATEMENT: Patient arrives to clinic alone, using LBQC. Patient states his BP was the reason he could not do therapy before.  He endorses continuing to feel off balance and struggling to walk.  He requests PT to replace a stopper on his [facility] - time spent doing this for ambulatory safety.  Pt accompanied by: self  PERTINENT HISTORY: R MCA CVA, Alcohol Dependence, Marijuana/Tobacco Dependence, Back Pain   PAIN:  Are you having pain? Yes: NPRS scale: 4-5/10 Pain location: L shoulder Pain description: aching Aggravating factors: laying on L side Relieving factors: pain medicine or patches  Today's Vitals   05/25/23 1409 05/25/23 1412  BP: (!) 149/110 (!) 138/93  Pulse: (!) 57 (!) 53   PRECAUTIONS: Fall and Other: L spastic hemi  WEIGHT BEARING RESTRICTIONS: No  FALLS: Has patient fallen in last 6 months?  No  LIVING ENVIRONMENT: Lives with:  roommate Lives in: House/apartment Stairs: Yes: External: 8 steps; bilateral but cannot reach both Has following equipment at home: Quad cane large base, Hemi walker, Wheelchair (manual), shower chair, and Grab bars  PLOF: Independent with household mobility with device, Needs assistance with ADLs, and Needs assistance with homemaking-aide comes in the mornings for 4 hours on weekdays and 2 hours on weekends  PATIENT GOALS: "to get some mobility on my left side"  "to be able to stand longer and walk easier"  OBJECTIVE:   DIAGNOSTIC FINDINGS: 01/09/20 head CT: IMPRESSION: 1. Evolving right cerebral infarct with evidence for hemorrhagic transformation at the right basal ganglia, similar in appearance and distribution as compared to previous MRI. Associated mass effect with 3 mm of right-to-left shift. No hydrocephalus or ventricular trapping. 2. No other new acute intracranial abnormality.  COGNITION: Overall cognitive status: Within functional limits for tasks assessed   SENSATION: WFL  COORDINATION: LE RAMS:  limited by lack of DF AROM on L Heel-to-shin:  can cross left to right, but cannot bring heel directly to shin   MUSCLE TONE: LLE: Hypertonic Clonus present  POSTURE: rounded shoulders, forward head, increased thoracic kyphosis, posterior pelvic tilt, left pelvic obliquity, and weight shift right  LOWER EXTREMITY MMT:    MMT Right Eval Left Eval  Hip flexion 5 2-  Hip extension    Hip abduction 5 3  Hip adduction 5 3  Hip internal rotation    Hip external rotation    Knee flexion 4 2+/5  Knee extension 5 3 w/ increased time he can achieve full extension  Ankle dorsiflexion 5 0  Ankle plantarflexion    Ankle inversion    Ankle eversion    (Blank rows = not tested)  BED MOBILITY:  Independent per patient report - he reports his bed is lower which helps  TRANSFERS: Assistive device utilized: Quad cane large base  Sit  to stand: SBA Stand to sit: SBA Chair to chair: SBA  GAIT: Gait pattern: step to pattern, decreased arm swing- Left, decreased step length- Right, decreased stance time- Left, decreased hip/knee flexion- Left, decreased ankle dorsiflexion- Left, circumduction- Left, Left hip hike, lateral lean- Right, trunk rotated posterior- Left, and poor foot clearance- Left Distance walked: various clinic distances Assistive device utilized: Quad cane large base Level of assistance: SBA Comments:  LUE held in flexion and IR and LLE lacking heel strike due to PF tone.  Gentle L knee flexion maintained in stance due to spasticity.  FUNCTIONAL TESTS:  5 times sit to stand: 18.53 seconds w/ RUE support and right lateral lean Timed up and go (  TUG): 22.69 seconds w/ CGA and LBQC Berg Balance Scale: To be finished next session.  [facility] PT Assessment - 05/25/23 1439       Berg Balance Test   Sit to Stand Able to stand  independently using hands    Standing Unsupported Able to stand 2 minutes with supervision    Sitting with Back Unsupported but Feet Supported on Floor or Stool Able to sit safely and securely 2 minutes    Stand to Sit Controls descent by using hands    Transfers Able to transfer with verbal cueing and /or supervision    Standing Unsupported with Eyes Closed Able to stand 10 seconds with supervision            PATIENT SURVEYS:  FOTO not captured due to chronicity of CVA.  TODAY'S TREATMENT:                                                                                                                              N/A eval only.  PATIENT EDUCATION: Education details: PT POC, assessments used and to be used, appropriate BP parameters, and goals of therapy. Person educated: Patient Education method: Explanation Education comprehension: verbalized understanding and needs further education  HOME EXERCISE PROGRAM: To be provided.  GOALS: Goals reviewed with patient? Yes  SHORT TERM  GOALS: Target date: 06/24/2023  Pt will be independent and compliant with strengthening, stretching, and balance focused HEP in order to maintain functional progress and improve mobility. Baseline:  To be established. Goal status: INITIAL  2.  Pt will decrease 5xSTS to </=15.53 seconds using RUE support as needed with improved midline in order to demonstrate decreased risk for falls and improved functional bilateral LE strength and power. Baseline: 18.53 sec w/ RUE support and right weight shift Goal status: INITIAL  3.  Pt will demonstrate TUG of </=18.69 seconds at SBA level in order to decrease risk of falls and improve functional mobility using LRAD. Baseline: 22.69 sec CGA w/ LBQC Goal status: INITIAL  LONG TERM GOALS: Target date: 07/22/2023 (Note:  due to scheduling, likely assess single goal at LTG and update date for last visit at 10 week mark)  Pt will decrease 5xSTS to </=12.53 seconds using RUE support as needed with improved midline in order to demonstrate decreased risk for falls and improved functional bilateral LE strength and power. Baseline: 18.53 sec w/ RUE support and right weight shift Goal status: INITIAL  2.  Pt will demonstrate TUG of </=14.69 seconds at SBA level in order to decrease risk of falls and improve functional mobility using LRAD. Baseline: 22.69 sec CGA w/ LBQC Goal status: INITIAL  3.  BERG to be completed at next visit with goal set as appropriate. Baseline: Initiated on eval. Goal status: INITIAL  4.  Pt will ambulate >/=250 feet with LBQC and no more than SBA w/o increased shoulder pain and improved left LE control to promote household and community access.  Baseline:  L shoulder pain 4-5/10, left knee flexion in stance  Goal status:  INITIAL  ASSESSMENT:  CLINICAL IMPRESSION: Patient is a 47 y.o. male who was seen today for physical therapy evaluation and treatment for L spastic hemiparesis due to chronic CVA.  Pt has a significant PMH of R  MCA CVA, Alcohol Dependence, Marijuana/Tobacco Dependence, and Back Pain.  Identified impairments include left shoulder pain, elevated BP, difficulty completing ADLs and homemaking, impaired LE coordination, left LE spasticity and clonus, chronic postural changes of upper trunk and pelvis, left hemiparesis with left hip and knee flexion most impaired, spastic LUE maintained in flexion, and left knee in flexion during stance phase.  Evaluation via the following assessment tools: 5xSTS score of 18.53 seconds w/ RUE support and TUG time of 22.69 seconds using LBQC indicate significant fall risk.  BERG to be completed to further assess static stability and fall risk.  He would benefit from skilled PT to address impairments as noted and progress towards long term goals.    OBJECTIVE IMPAIRMENTS: Abnormal gait, decreased activity tolerance, decreased balance, decreased coordination, decreased endurance, decreased knowledge of condition, decreased knowledge of use of DME, difficulty walking, decreased strength, impaired perceived functional ability, impaired tone, impaired UE functional use, postural dysfunction, and pain.   ACTIVITY LIMITATIONS: carrying, lifting, bending, standing, squatting, stairs, bathing, dressing, hygiene/grooming, locomotion level, and caring for others  PARTICIPATION LIMITATIONS: meal prep, cleaning, interpersonal relationship, driving, shopping, community activity, and occupation  PERSONAL FACTORS: Behavior pattern, Fitness, Past/current experiences, Time since onset of injury/illness/exacerbation, Transportation, and 3+ comorbidities: tobacco dependence, HTN, ETOH, chronic back pain  are also affecting patient's functional outcome.   REHAB POTENTIAL: Fair See PMH and personal factors  CLINICAL DECISION MAKING: Evolving/moderate complexity  EVALUATION COMPLEXITY: Moderate  PLAN:  PT FREQUENCY: 1x/week  PT DURATION: 10 weeks (will initially plan for 8 weeks)  PLANNED  INTERVENTIONS: Therapeutic exercises, Therapeutic activity, Neuromuscular re-education, Balance training, Gait training, Patient/Family education, Self Care, Joint mobilization, Stair training, Vestibular training, Visual/preceptual remediation/compensation, Orthotic/Fit training, DME instructions, Aquatic Therapy, Electrical stimulation, Taping, Manual therapy, and Re-evaluation  PLAN FOR NEXT SESSION: ASSESS R BP!  Complete BERG-set LTG.  Review prior or initiate new HEP for LLE strengthening, static and dynamic balance, spasticity management (stretching).  Gait training/pre-gait - pt uses Morley Kos, PT, DPT 05/25/2023, 2:49 PM

## 2023-06-17 ENCOUNTER — Ambulatory Visit: Payer: Medicare HMO | Admitting: Physical Therapy

## 2023-06-17 ENCOUNTER — Encounter: Payer: Self-pay | Admitting: Physical Therapy

## 2023-06-17 VITALS — BP 178/107 | HR 58

## 2023-06-17 DIAGNOSIS — M24542 Contracture, left hand: Secondary | ICD-10-CM | POA: Insufficient documentation

## 2023-06-17 DIAGNOSIS — I69354 Hemiplegia and hemiparesis following cerebral infarction affecting left non-dominant side: Secondary | ICD-10-CM

## 2023-06-17 DIAGNOSIS — M6281 Muscle weakness (generalized): Secondary | ICD-10-CM

## 2023-06-17 DIAGNOSIS — R293 Abnormal posture: Secondary | ICD-10-CM

## 2023-06-17 DIAGNOSIS — R2681 Unsteadiness on feet: Secondary | ICD-10-CM

## 2023-06-17 DIAGNOSIS — G8110 Spastic hemiplegia affecting unspecified side: Secondary | ICD-10-CM | POA: Insufficient documentation

## 2023-06-17 DIAGNOSIS — R2689 Other abnormalities of gait and mobility: Secondary | ICD-10-CM | POA: Insufficient documentation

## 2023-06-17 DIAGNOSIS — M79602 Pain in left arm: Secondary | ICD-10-CM | POA: Insufficient documentation

## 2023-06-17 NOTE — Therapy (Signed)
OUTPATIENT PHYSICAL THERAPY NEURO TREATMENT - ARRIVED NO CHARGE   Patient Name: Derek [name] MRN: [id number] DOB:10-20-1976, 47 y.o., male Today's Date: 06/17/2023   PCP: Aliene Beams, MD REFERRING PROVIDER: Ranelle Oyster, MD  END OF SESSION:  PT End of Session - 06/17/23 1420     Visit Number 1    Number of Visits 9   8 + eval   Date for PT Re-Evaluation 08/05/23   pushed out due to multi-D scheduling delay   Authorization Type [employer]IC[employer]    Progress Note Due on Visit 10    PT Start Time 1413   Patient arrives late.   PT Stop Time 1442    PT Time Calculation (min) 29 min    Equipment Utilized During Treatment Gait belt    Activity Tolerance Treatment limited secondary to medical complications (Comment);Other (comment)   Diastolic >100   Behavior During Therapy [facility] for tasks assessed/performed;Flat affect              Past Medical History:  Diagnosis Date   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/07/2020   Alcohol dependence (HCC)    Back pain    Marijuana dependence (HCC)    Tobacco dependence    Past Surgical History:  Procedure Laterality Date   BUBBLE STUDY  01/10/2020   Procedure: BUBBLE STUDY;  Surgeon: Sande Rives, MD;  Location: [facility] ENDOSCOPY;  Service: Cardiovascular;;   TEE WITHOUT CARDIOVERSION N/A 01/10/2020   Procedure: TRANSESOPHAGEAL ECHOCARDIOGRAM (TEE);  Surgeon: Sande Rives, MD;  Location: [facility] ENDOSCOPY;  Service: Cardiovascular;  Laterality: N/A;   Patient Active Problem List   Diagnosis Date Noted   Moderate major depression, single episode (HCC) 04/14/2022   Arteriosclerosis of carotid artery 02/01/2022   Essential hypertension 02/01/2022   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) 02/01/2022   History of substance abuse (HCC) 02/01/2022   Iron deficiency anemia 02/01/2022   Late effects of cerebrovascular disease 02/01/2022   Obesity 02/01/2022   Pure hypercholesterolemia  02/01/2022   Pain due to onychomycosis of toenails of both feet 06/10/2021   Neuropathic pain 04/09/2020   Pain    Spastic hemiparesis (HCC)    History of hypertension    Dyslipidemia    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   Leukocytosis 01/12/2020   Cerebrovascular accident (CVA) due to thrombosis of right middle cerebral artery (HCC)    Chronic pain syndrome    Hypokalemia    Dysphagia, post-stroke    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/07/2020   Tobacco dependence    Marijuana dependence (HCC)    Alcohol dependence (HCC)     ONSET DATE: 2021 (CVA)  REFERRING DIAG: I63.511 (ICD-10-CM) - Right middle cerebral artery stroke (HCC) G81.10 (ICD-10-CM) - Spastic hemiparesis (HCC)  THERAPY DIAG:  Abnormal posture  Other abnormalities of gait and mobility  Unsteadiness on feet  Muscle weakness (generalized)  Spastic hemiplegia of left nondominant 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 [facility])  Rationale for Evaluation and Treatment: Rehabilitation  SUBJECTIVE:  SUBJECTIVE STATEMENT: Patient arrives to clinic alone, using LBQC. He has had a recent medication change, but is unsure what it is called though he knows the dose is 30MG  per tablet.  He ran out of Tizanidine and has yet to get it refilled.  Patient requesting water and a snack due to only having eaten a bowl of cereal at about 11:30 this morning.  He reports having a slight headache.  Patient states he also smoked 2 cigarettes back-to-back. Pt accompanied by: self  PERTINENT HISTORY: R MCA CVA, Alcohol Dependence, Marijuana/Tobacco Dependence, Back Pain   PAIN:  Are you having pain? Yes: NPRS scale: 4/10 Pain location: L hip Pain description: achy Aggravating factors: laying on L side Relieving factors: pain  medicine or patches  RUE in sitting prior to session: Today's Vitals   06/17/23 1425 06/17/23 1430 06/17/23 1440  BP: (!) 161/101 (!) 161/110 (!) 178/107  Pulse: (!) 57 (!) 58 (!) 58    PRECAUTIONS: Fall and Other: L spastic hemi  WEIGHT BEARING RESTRICTIONS: No  FALLS: Has patient fallen in last 6 months? No  LIVING ENVIRONMENT: Lives with:  roommate Lives in: House/apartment Stairs: Yes: External: 8 steps; bilateral but cannot reach both Has following equipment at home: Quad cane large base, Hemi walker, Wheelchair (manual), shower chair, and Grab bars  PLOF: Independent with household mobility with device, Needs assistance with ADLs, and Needs assistance with homemaking-aide comes in the mornings for 4 hours on weekdays and 2 hours on weekends  PATIENT GOALS: "to get some mobility on my left side"  "to be able to stand longer and walk easier"  OBJECTIVE:   DIAGNOSTIC FINDINGS: 01/09/20 head CT: IMPRESSION: 1. Evolving right cerebral infarct with evidence for hemorrhagic transformation at the right basal ganglia, similar in appearance and distribution as compared to previous MRI. Associated mass effect with 3 mm of right-to-left shift. No hydrocephalus or ventricular trapping. 2. No other new acute intracranial abnormality.  COGNITION: Overall cognitive status: Within functional limits for tasks assessed   SENSATION: WFL  COORDINATION: LE RAMS:  limited by lack of DF AROM on L Heel-to-shin:  can cross left to right, but cannot bring heel directly to shin   MUSCLE TONE: LLE: Hypertonic Clonus present  POSTURE: rounded shoulders, forward head, increased thoracic kyphosis, posterior pelvic tilt, left pelvic obliquity, and weight shift right  LOWER EXTREMITY MMT:    MMT Right Eval Left Eval  Hip flexion 5 2-  Hip extension    Hip abduction 5 3  Hip adduction 5 3  Hip internal rotation    Hip external rotation    Knee flexion 4 2+/5  Knee extension 5 3 w/  increased time he can achieve full extension  Ankle dorsiflexion 5 0  Ankle plantarflexion    Ankle inversion    Ankle eversion    (Blank rows = not tested)  BED MOBILITY:  Independent per patient report - he reports his bed is lower which helps  TRANSFERS: Assistive device utilized: Quad cane large base  Sit to stand: SBA Stand to sit: SBA Chair to chair: SBA  GAIT: Gait pattern: step to pattern, decreased arm swing- Left, decreased step length- Right, decreased stance time- Left, decreased hip/knee flexion- Left, decreased ankle dorsiflexion- Left, circumduction- Left, Left hip hike, lateral lean- Right, trunk rotated posterior- Left, and poor foot clearance- Left Distance walked: various clinic distances Assistive device utilized: Quad cane large base Level of assistance: SBA Comments:  LUE held in flexion and IR and  LLE lacking heel strike due to PF tone.  Gentle L knee flexion maintained in stance due to spasticity.  FUNCTIONAL TESTS:  5 times sit to stand: 18.53 seconds w/ RUE support and right lateral lean Timed up and go (TUG): 22.69 seconds w/ CGA and LBQC Berg Balance Scale: To be finished next session.   PATIENT SURVEYS:  FOTO not captured due to chronicity of CVA.  TODAY'S TREATMENT:                                                                                                                              None - see assessment.  PATIENT EDUCATION: Education details: BP parameters for therapy.  Contacting MD for medication refill.  Trying to obtain a BP monitor or alternatively using store pharmacy automatic cuff like at [facility] once a week.  Discussed benefits of smoking cessation on BP with pt stating he is working on this.   Person educated: Patient Education method: Explanation Education comprehension: verbalized understanding and needs further education  HOME EXERCISE PROGRAM: To be provided.  GOALS: Goals reviewed with patient? Yes  SHORT TERM GOALS:  Target date: 06/24/2023  Pt will be independent and compliant with strengthening, stretching, and balance focused HEP in order to maintain functional progress and improve mobility. Baseline:  To be established. Goal status: INITIAL  2.  Pt will decrease 5xSTS to </=15.53 seconds using RUE support as needed with improved midline in order to demonstrate decreased risk for falls and improved functional bilateral LE strength and power. Baseline: 18.53 sec w/ RUE support and right weight shift Goal status: INITIAL  3.  Pt will demonstrate TUG of </=18.69 seconds at SBA level in order to decrease risk of falls and improve functional mobility using LRAD. Baseline: 22.69 sec CGA w/ LBQC Goal status: INITIAL  LONG TERM GOALS: Target date: 07/22/2023 (Note:  due to scheduling, likely assess single goal at LTG and update date for last visit at 10 week mark)  Pt will decrease 5xSTS to </=12.53 seconds using RUE support as needed with improved midline in order to demonstrate decreased risk for falls and improved functional bilateral LE strength and power. Baseline: 18.53 sec w/ RUE support and right weight shift Goal status: INITIAL  2.  Pt will demonstrate TUG of </=14.69 seconds at SBA level in order to decrease risk of falls and improve functional mobility using LRAD. Baseline: 22.69 sec CGA w/ LBQC Goal status: INITIAL  3.  BERG to be completed at next visit with goal set as appropriate. Baseline: Initiated on eval. Goal status: INITIAL  4.  Pt will ambulate >/=250 feet with LBQC and no more than SBA w/o increased shoulder pain and improved left LE control to promote household and community access.  Baseline:  L shoulder pain 4-5/10, left knee flexion in stance  Goal status:  INITIAL  ASSESSMENT:  CLINICAL IMPRESSION: Unable to safely complete session today due to diastolic blood pressure trending over 100.  Patient also symptomatic with headache.  PT did provide water and a snack as patient  has not eaten much today and requested this.  He was encouraged to follow-up with his doctor, refill medication that he is out of per guidelines, and seek emergency medical attention if other concerning symptoms arise or headache worsens.  MD is aware of ongoing issues with BP from prior bout of care.  Will reach out again if trend continues to impact sessions.  Pt declined offer of calling someone to pick him up and seeking evaluation at the hospital via EMS.  OBJECTIVE IMPAIRMENTS: Abnormal gait, decreased activity tolerance, decreased balance, decreased coordination, decreased endurance, decreased knowledge of condition, decreased knowledge of use of DME, difficulty walking, decreased strength, impaired perceived functional ability, impaired tone, impaired UE functional use, postural dysfunction, and pain.   ACTIVITY LIMITATIONS: carrying, lifting, bending, standing, squatting, stairs, bathing, dressing, hygiene/grooming, locomotion level, and caring for others  PARTICIPATION LIMITATIONS: meal prep, cleaning, interpersonal relationship, driving, shopping, community activity, and occupation  PERSONAL FACTORS: Behavior pattern, Fitness, Past/current experiences, Time since onset of injury/illness/exacerbation, Transportation, and 3+ comorbidities: tobacco dependence, HTN, ETOH, chronic back pain  are also affecting patient's functional outcome.   REHAB POTENTIAL: Fair See PMH and personal factors  CLINICAL DECISION MAKING: Evolving/moderate complexity  EVALUATION COMPLEXITY: Moderate  PLAN:  PT FREQUENCY: 1x/week  PT DURATION: 10 weeks (will initially plan for 8 weeks)  PLANNED INTERVENTIONS: Therapeutic exercises, Therapeutic activity, Neuromuscular re-education, Balance training, Gait training, Patient/Family education, Self Care, Joint mobilization, Stair training, Vestibular training, Visual/preceptual remediation/compensation, Orthotic/Fit training, DME instructions, Aquatic Therapy,  Electrical stimulation, Taping, Manual therapy, and Re-evaluation  PLAN FOR NEXT SESSION: ASSESS R BP!  Complete BERG-set LTG.  Review prior or initiate new HEP for LLE strengthening, static and dynamic balance, spasticity management (stretching).  Gait training/pre-gait - pt uses Morley Kos, PT, DPT 06/17/2023, 3:12 PM

## 2023-06-22 ENCOUNTER — Ambulatory Visit: Payer: Medicare HMO | Attending: Physical Medicine & Rehabilitation | Admitting: Occupational Therapy

## 2023-06-22 ENCOUNTER — Ambulatory Visit: Payer: Medicare HMO | Admitting: Physical Therapy

## 2023-06-29 ENCOUNTER — Encounter: Payer: Self-pay | Admitting: Podiatry

## 2023-06-29 ENCOUNTER — Ambulatory Visit: Payer: Medicare HMO | Admitting: Podiatry

## 2023-06-29 ENCOUNTER — Encounter: Payer: Medicare HMO | Attending: Physical Medicine & Rehabilitation | Admitting: Physical Medicine & Rehabilitation

## 2023-06-29 ENCOUNTER — Encounter: Payer: Self-pay | Admitting: Physical Medicine & Rehabilitation

## 2023-06-29 VITALS — BP 144/106 | HR 83 | Ht 71.0 in | Wt 220.0 lb

## 2023-06-29 DIAGNOSIS — B351 Tinea unguium: Secondary | ICD-10-CM | POA: Diagnosis not present

## 2023-06-29 DIAGNOSIS — M79674 Pain in right toe(s): Secondary | ICD-10-CM | POA: Diagnosis not present

## 2023-06-29 DIAGNOSIS — M79675 Pain in left toe(s): Secondary | ICD-10-CM | POA: Diagnosis not present

## 2023-06-29 DIAGNOSIS — G8110 Spastic hemiplegia affecting unspecified side: Secondary | ICD-10-CM | POA: Insufficient documentation

## 2023-06-29 DIAGNOSIS — I63311 Cerebral infarction due to thrombosis of right middle cerebral artery: Secondary | ICD-10-CM

## 2023-06-29 DIAGNOSIS — M792 Neuralgia and neuritis, unspecified: Secondary | ICD-10-CM

## 2023-06-29 MED ORDER — SODIUM CHLORIDE (PF) 0.9 % IJ SOLN
3.0000 mL | INTRAMUSCULAR | Status: AC | PRN
  Administered 2023-06-29: 3 mL via INTRAVENOUS

## 2023-06-29 MED ORDER — INCOBOTULINUMTOXINA 100 UNITS IM SOLR
400.0000 [IU] | Freq: Once | INTRAMUSCULAR | Status: AC
  Administered 2023-06-29: 400 [IU] via INTRAMUSCULAR

## 2023-06-29 NOTE — Progress Notes (Signed)
Xeomin Injection for spasticity of upper extremity using needle EMG guidance Indication: Spastic hemiparesis (HCC) - Plan: incobotulinumtoxinA (XEOMIN) 100 units injection 400 Units, sodium chloride (PF) 0.9 % injection 3 mL G81.10  Dilution: 100 Units/ml        Total Units Injected: 400 Indication: Severe spasticity which interferes with ADL,mobility and/or  hygiene and is unresponsive to medication management and other conservative care Informed consent was obtained after describing risks and benefits of the procedure with the patient. This includes bleeding, bruising, infection, excessive weakness, or medication side effects. A REMS form is on file and signed.  Needle: 50mm injectable monopolar needle electrode    Number of units per muscle Pectoralis Major 50 units Pectoralis Minor 50 units Biceps 100 units Brachioradialis 0 units FCR 25 units FCU 25 units FDS 75 units FDP 75 units FPL 0 units Palmaris Longus 0 units Pronator Teres 0 units Pronator Quadratus 0 units Lumbricals 0 units All injections were done after obtaining appropriate EMG activity and after negative drawback for blood. The patient tolerated the procedure well. Post procedure instructions were given. Return in about 3 months (around 09/29/2023) for xeomin 500 units LUE.

## 2023-06-29 NOTE — Progress Notes (Signed)
This patient presents to the office for evaluation and treatment of long thick painful nails .  This patient is unable to trim his own nails since the patient cannot reach his feet.  Patient says the nails are painful walking and wearing his shoes.  Patient has history of CVA .   He returns for preventive foot care services.  General Appearance  Alert, conversant and in no acute stress.  Vascular  Dorsalis pedis and posterior tibial  pulses are palpable  bilaterally.  Capillary return is within normal limits  bilaterally. Temperature is within normal limits  bilaterally.  Neurologic  Senn-Weinstein monofilament wire test within normal limits  bilaterally. Muscle power within normal limits bilaterally.  Nails Thick disfigured discolored nails with subungual debris  from hallux to fifth toes bilaterally. No evidence of bacterial infection or drainage bilaterally.  Orthopedic  No limitations of motion  feet .  No crepitus or effusions noted.  No bony pathology or digital deformities noted.  Skin  normotropic skin with no porokeratosis noted bilaterally.  No signs of infections or ulcers noted.     Onychomycosis  Pain in toes right foot  Pain in toes left foot  Debridement  of nails  1-5  B/L with a nail nipper.  Nails were then filed using a dremel tool with no incidents.    RTC  12   weeks    Gardiner Barefoot DPM

## 2023-06-29 NOTE — Patient Instructions (Signed)
ALWAYS FEEL FREE TO CALL OUR OFFICE WITH ANY PROBLEMS OR QUESTIONS ([phone])  **PLEASE NOTE** ALL MEDICATION REFILL REQUESTS (INCLUDING CONTROLLED SUBSTANCES) NEED TO BE MADE AT LEAST 7 DAYS PRIOR TO REFILL BEING DUE. ANY REFILL REQUESTS INSIDE THAT TIME FRAME MAY RESULT IN DELAYS IN RECEIVING YOUR PRESCRIPTION.                    

## 2023-07-01 ENCOUNTER — Encounter: Payer: Self-pay | Admitting: Physical Therapy

## 2023-07-01 ENCOUNTER — Ambulatory Visit: Payer: Medicare HMO | Admitting: Physical Therapy

## 2023-07-01 ENCOUNTER — Ambulatory Visit: Payer: Medicare HMO | Admitting: Occupational Therapy

## 2023-07-01 DIAGNOSIS — G8110 Spastic hemiplegia affecting unspecified side: Secondary | ICD-10-CM

## 2023-07-01 DIAGNOSIS — R2681 Unsteadiness on feet: Secondary | ICD-10-CM | POA: Diagnosis not present

## 2023-07-01 DIAGNOSIS — I69354 Hemiplegia and hemiparesis following cerebral infarction affecting left non-dominant side: Secondary | ICD-10-CM

## 2023-07-01 DIAGNOSIS — M6281 Muscle weakness (generalized): Secondary | ICD-10-CM | POA: Diagnosis not present

## 2023-07-01 DIAGNOSIS — R293 Abnormal posture: Secondary | ICD-10-CM

## 2023-07-01 DIAGNOSIS — M79602 Pain in left arm: Secondary | ICD-10-CM

## 2023-07-01 DIAGNOSIS — R2689 Other abnormalities of gait and mobility: Secondary | ICD-10-CM

## 2023-07-01 DIAGNOSIS — M24542 Contracture, left hand: Secondary | ICD-10-CM | POA: Diagnosis not present

## 2023-07-01 NOTE — Patient Instructions (Signed)
Access Code: [email or web address] URL: [email or web address] Date: 07/01/2023 Prepared by: Amada Kingfisher  Exercises - Seated Shoulder Flexion Towel Slide at Table Top  - 1 x daily - 10 reps - Seated Shoulder Scaption Slide at Table Top with Forearm in Neutral  - 1 x daily - 10 reps - Forearm Supination PROM  - 1 x daily - 10 reps

## 2023-07-01 NOTE — Therapy (Signed)
OUTPATIENT PHYSICAL THERAPY NEURO TREATMENT   Patient Name: Derek [name] MRN: [id number] DOB:13-Jun-1976, 47 y.o., male Today's Date: 07/01/2023   PCP: Aliene Beams, MD REFERRING PROVIDER: Ranelle Oyster, MD  END OF SESSION:  PT End of Session - 07/01/23 1238     Visit Number 2    Number of Visits 9   8 + eval   Date for PT Re-Evaluation 08/05/23   pushed out due to multi-D scheduling delay   Authorization Type [employer] MEDICARE    Progress Note Due on Visit 10    PT Start Time 1232    PT Stop Time 1315    PT Time Calculation (min) 43 min    Equipment Utilized During Treatment Gait belt    Activity Tolerance Patient tolerated treatment well    Behavior During Therapy [facility] for tasks assessed/performed            Past Medical History:  Diagnosis Date   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/07/2020   Alcohol dependence (HCC)    Back pain    Marijuana dependence (HCC)    Tobacco dependence    Past Surgical History:  Procedure Laterality Date   BUBBLE STUDY  01/10/2020   Procedure: BUBBLE STUDY;  Surgeon: Sande Rives, MD;  Location: [facility] ENDOSCOPY;  Service: Cardiovascular;;   TEE WITHOUT CARDIOVERSION N/A 01/10/2020   Procedure: TRANSESOPHAGEAL ECHOCARDIOGRAM (TEE);  Surgeon: Sande Rives, MD;  Location: [facility] ENDOSCOPY;  Service: Cardiovascular;  Laterality: N/A;   Patient Active Problem List   Diagnosis Date Noted   Moderate major depression, single episode (HCC) 04/14/2022   Arteriosclerosis of carotid artery 02/01/2022   Essential hypertension 02/01/2022   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) 02/01/2022   History of substance abuse (HCC) 02/01/2022   Iron deficiency anemia 02/01/2022   Late effects of cerebrovascular disease 02/01/2022   Obesity 02/01/2022   Pure hypercholesterolemia 02/01/2022   Pain due to onychomycosis of toenails of both feet 06/10/2021   Neuropathic pain 04/09/2020   Pain     Spastic hemiparesis (HCC)    History of hypertension    Dyslipidemia    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   Leukocytosis 01/12/2020   Cerebrovascular accident (CVA) due to thrombosis of right middle cerebral artery (HCC)    Chronic pain syndrome    Hypokalemia    Dysphagia, post-stroke    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/07/2020   Tobacco dependence    Marijuana dependence (HCC)    Alcohol dependence (HCC)     ONSET DATE: 2021 (CVA)  REFERRING DIAG: I63.511 (ICD-10-CM) - Right middle cerebral artery stroke (HCC) G81.10 (ICD-10-CM) - Spastic hemiparesis (HCC)  THERAPY DIAG:  Abnormal posture  Other abnormalities of gait and mobility  Unsteadiness on feet  Muscle weakness (generalized)  Spastic hemiplegia of left nondominant 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 [facility])  Rationale for Evaluation and Treatment: Rehabilitation  SUBJECTIVE:  SUBJECTIVE STATEMENT: Patient arrives to clinic alone, using LBQC. OT reporting normal BP today (reference OT note).  He has tape on cane handle needing better grip so PT wraps coband on handle for improved safety with AD use. Pt accompanied by: self  PERTINENT HISTORY: R MCA CVA, Alcohol Dependence, Marijuana/Tobacco Dependence, Back Pain   PAIN:  Are you having pain? Yes: NPRS scale: 4/10 Pain location: L hip Pain description: sore Aggravating factors: laying on L side Relieving factors: pain medicine or patches  There were no vitals filed for this visit. - see OT note.  PRECAUTIONS: Fall and Other: L spastic hemi  WEIGHT BEARING RESTRICTIONS: No  FALLS: Has patient fallen in last 6 months? No  LIVING ENVIRONMENT: Lives with:  roommate Lives in: House/apartment Stairs: Yes: External: 8 steps; bilateral but  cannot reach both Has following equipment at home: Quad cane large base, Hemi walker, Wheelchair (manual), shower chair, and Grab bars  PLOF: Independent with household mobility with device, Needs assistance with ADLs, and Needs assistance with homemaking-aide comes in the mornings for 4 hours on weekdays and 2 hours on weekends  PATIENT GOALS: "to get some mobility on my left side"  "to be able to stand longer and walk easier"  OBJECTIVE:   DIAGNOSTIC FINDINGS: 01/09/20 head CT: IMPRESSION: 1. Evolving right cerebral infarct with evidence for hemorrhagic transformation at the right basal ganglia, similar in appearance and distribution as compared to previous MRI. Associated mass effect with 3 mm of right-to-left shift. No hydrocephalus or ventricular trapping. 2. No other new acute intracranial abnormality.  COGNITION: Overall cognitive status: Within functional limits for tasks assessed   SENSATION: WFL  COORDINATION: LE RAMS:  limited by lack of DF AROM on L Heel-to-shin:  can cross left to right, but cannot bring heel directly to shin   MUSCLE TONE: LLE: Hypertonic Clonus present  POSTURE: rounded shoulders, forward head, increased thoracic kyphosis, posterior pelvic tilt, left pelvic obliquity, and weight shift right  LOWER EXTREMITY MMT:    MMT Right Eval Left Eval  Hip flexion 5 2-  Hip extension    Hip abduction 5 3  Hip adduction 5 3  Hip internal rotation    Hip external rotation    Knee flexion 4 2+/5  Knee extension 5 3 w/ increased time he can achieve full extension  Ankle dorsiflexion 5 0  Ankle plantarflexion    Ankle inversion    Ankle eversion    (Blank rows = not tested)  BED MOBILITY:  Independent per patient report - he reports his bed is lower which helps  TRANSFERS: Assistive device utilized: Quad cane large base  Sit to stand: SBA Stand to sit: SBA Chair to chair: SBA  GAIT: Gait pattern: step to pattern, decreased arm swing- Left,  decreased step length- Right, decreased stance time- Left, decreased hip/knee flexion- Left, decreased ankle dorsiflexion- Left, circumduction- Left, Left hip hike, lateral lean- Right, trunk rotated posterior- Left, and poor foot clearance- Left Distance walked: various clinic distances Assistive device utilized: Quad cane large base Level of assistance: SBA Comments:  LUE held in flexion and IR and LLE lacking heel strike due to PF tone.  Gentle L knee flexion maintained in stance due to spasticity.  FUNCTIONAL TESTS:  5 times sit to stand: 18.53 seconds w/ RUE support and right lateral lean Timed up and go (TUG): 22.69 seconds w/ CGA and LBQC Berg Balance Scale: To be finished next session.  [facility] PT Assessment - 07/01/23 1242  Berg Balance Test   Sit to Stand Able to stand  independently using hands    Standing Unsupported Able to stand safely 2 minutes    Sitting with Back Unsupported but Feet Supported on Floor or Stool Able to sit safely and securely 2 minutes    Stand to Sit Uses backs of legs against chair to control descent    Transfers Able to transfer with verbal cueing and /or supervision    Standing Unsupported with Eyes Closed Able to stand 10 seconds safely    Standing Unsupported with Feet Together Able to place feet together independently and stand for 1 minute with supervision    From Standing, Reach Forward with Outstretched Arm Can reach forward >12 cm safely (5")   5.5 "   From Standing Position, Pick up Object from Floor Able to pick up shoe, needs supervision    From Standing Position, Turn to Look Behind Over each Shoulder Looks behind from both sides and weight shifts well    Turn 360 Degrees Needs close supervision or verbal cueing    Standing Unsupported, Alternately Place Feet on Step/Stool Needs assistance to keep from falling or unable to try    Standing Unsupported, One Foot in Front Able to take small step independently and hold 30 seconds    Standing  on One Leg Tries to lift leg/unable to hold 3 seconds but remains standing independently    Total Score 36    Berg comment: 36/56 = significant fall risk             PATIENT SURVEYS:  FOTO not captured due to chronicity of CVA.  TODAY'S TREATMENT:                                                                                                                              BERG:  [facility] PT Assessment - 07/01/23 1242       Berg Balance Test   Sit to Stand Able to stand  independently using hands    Standing Unsupported Able to stand safely 2 minutes    Sitting with Back Unsupported but Feet Supported on Floor or Stool Able to sit safely and securely 2 minutes    Stand to Sit Uses backs of legs against chair to control descent    Transfers Able to transfer with verbal cueing and /or supervision    Standing Unsupported with Eyes Closed Able to stand 10 seconds safely    Standing Unsupported with Feet Together Able to place feet together independently and stand for 1 minute with supervision    From Standing, Reach Forward with Outstretched Arm Can reach forward >12 cm safely (5")   5.5 "   From Standing Position, Pick up Object from Floor Able to pick up shoe, needs supervision    From Standing Position, Turn to Look Behind Over each Shoulder Looks behind from both sides and weight shifts well    Turn 360 Degrees  Needs close supervision or verbal cueing    Standing Unsupported, Alternately Place Feet on Step/Stool Needs assistance to keep from falling or unable to try    Standing Unsupported, One Foot in Front Able to take small step independently and hold 30 seconds    Standing on One Leg Tries to lift leg/unable to hold 3 seconds but remains standing independently    Total Score 36    Berg comment: 36/56 = significant fall risk           Pt needs several seated rest breaks to complete test.  Reviewed HEP: -Staggered STS (L in rear) -Supine bridges x10 -Supine marches  x20 -Supine heel slide x10 LLE -Supine resisted hip and knee flexion x10; instructions for care aide assistance  PATIENT EDUCATION: Education details: Reprinted HEP - encouraged pt to continue as he has not been doing exercise.  Aquatic therapist not feeling pool is appropriate for pt at this time as pt inquires about this - cited need for BP monitoring, hot environment, and pt benefiting more from land strengthening.  Monitor BP at home, encouraged pt to get medications refilled as appropriate (1 needing to be filled today per report), and do HEP 4x before next appt with appt reminder. Person educated: Patient Education method: Explanation Education comprehension: verbalized understanding and needs further education  HOME EXERCISE PROGRAM: [id number]   GOALS: Goals reviewed with patient? Yes  SHORT TERM GOALS: Target date: 06/24/2023  Pt will be independent and compliant with strengthening, stretching, and balance focused HEP in order to maintain functional progress and improve mobility. Baseline:  To be established. Goal status: INITIAL  2.  Pt will decrease 5xSTS to </=15.53 seconds using RUE support as needed with improved midline in order to demonstrate decreased risk for falls and improved functional bilateral LE strength and power. Baseline: 18.53 sec w/ RUE support and right weight shift Goal status: INITIAL  3.  Pt will demonstrate TUG of </=18.69 seconds at SBA level in order to decrease risk of falls and improve functional mobility using LRAD. Baseline: 22.69 sec CGA w/ LBQC Goal status: INITIAL  LONG TERM GOALS: Target date: 07/22/2023 (Note:  due to scheduling, likely assess single goal at LTG and update date for last visit at 10 week mark)  Pt will decrease 5xSTS to </=12.53 seconds using RUE support as needed with improved midline in order to demonstrate decreased risk for falls and improved functional bilateral LE strength and power. Baseline: 18.53 sec w/ RUE support and  right weight shift Goal status: INITIAL  2.  Pt will demonstrate TUG of </=14.69 seconds at SBA level in order to decrease risk of falls and improve functional mobility using LRAD. Baseline: 22.69 sec CGA w/ LBQC Goal status: INITIAL  3.  Pt will increase BERG balance score to at least 41/56 to demonstrate improved static balance. Baseline: 36/56 (8/30) Goal status: INITIAL  4.  Pt will ambulate >/=250 feet with LBQC and no more than SBA w/o increased shoulder pain and improved left LE control to promote household and community access.  Baseline:  L shoulder pain 4-5/10, left knee flexion in stance  Goal status:  INITIAL  ASSESSMENT:  CLINICAL IMPRESSION: Assessed BERG this session with patient scoring 36/56 indicating significant fall risk.  LTG set to improve static stability.  Reprinted HEP and began review for patient to continue at home as he has been inactive otherwise.  He continues to benefit from skilled PT to promote functional strengthening and mobility of LLE and  to improve chronic deficits of gait.   OBJECTIVE IMPAIRMENTS: Abnormal gait, decreased activity tolerance, decreased balance, decreased coordination, decreased endurance, decreased knowledge of condition, decreased knowledge of use of DME, difficulty walking, decreased strength, impaired perceived functional ability, impaired tone, impaired UE functional use, postural dysfunction, and pain.   ACTIVITY LIMITATIONS: carrying, lifting, bending, standing, squatting, stairs, bathing, dressing, hygiene/grooming, locomotion level, and caring for others  PARTICIPATION LIMITATIONS: meal prep, cleaning, interpersonal relationship, driving, shopping, community activity, and occupation  PERSONAL FACTORS: Behavior pattern, Fitness, Past/current experiences, Time since onset of injury/illness/exacerbation, Transportation, and 3+ comorbidities: tobacco dependence, HTN, ETOH, chronic back pain  are also affecting patient's functional  outcome.   REHAB POTENTIAL: Fair See PMH and personal factors  CLINICAL DECISION MAKING: Evolving/moderate complexity  EVALUATION COMPLEXITY: Moderate  PLAN:  PT FREQUENCY: 1x/week  PT DURATION: 10 weeks (will initially plan for 8 weeks)  PLANNED INTERVENTIONS: Therapeutic exercises, Therapeutic activity, Neuromuscular re-education, Balance training, Gait training, Patient/Family education, Self Care, Joint mobilization, Stair training, Vestibular training, Visual/preceptual remediation/compensation, Orthotic/Fit training, DME instructions, Aquatic Therapy, Electrical stimulation, Taping, Manual therapy, and Re-evaluation  PLAN FOR NEXT SESSION: ASSESS R BP!  ASSESS STGs!  Finish reviewing prior or initiate new HEP for LLE strengthening, static and dynamic balance, spasticity management (stretching).  Gait training/pre-gait - pt uses LBQC  Sadie Haber, PT, DPT 07/01/2023, 1:20 PM

## 2023-07-01 NOTE — Therapy (Signed)
OUTPATIENT OCCUPATIONAL THERAPY NEURO TREATMENT  Patient Name: Derek [name] MRN: [id number] DOB:[birth year], 47 y.o., male Today's Date: 07/01/2023  PCP: Aliene Beams, MD REFERRING PROVIDER: Ranelle Oyster, MD  END OF SESSION:  OT End of Session - 07/01/23 1111     Visit Number 2    Number of Visits 12   + evaluation   Date for OT Re-Evaluation 08/12/23    Authorization Type [employer]IC[employer]    Progress Note Due on Visit 10    OT Start Time 1113    OT Stop Time 1151    OT Time Calculation (min) 38 min    Activity Tolerance Patient tolerated treatment well    Behavior During Therapy [facility] for tasks assessed/performed             Past Medical History:  Diagnosis Date   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/07/2020   Alcohol dependence (HCC)    Back pain    Marijuana dependence (HCC)    Tobacco dependence    Past Surgical History:  Procedure Laterality Date   BUBBLE STUDY  01/10/2020   Procedure: BUBBLE STUDY;  Surgeon: Sande Rives, MD;  Location: [facility] ENDOSCOPY;  Service: Cardiovascular;;   TEE WITHOUT CARDIOVERSION N/A 01/10/2020   Procedure: TRANSESOPHAGEAL ECHOCARDIOGRAM (TEE);  Surgeon: Sande Rives, MD;  Location: [facility] ENDOSCOPY;  Service: Cardiovascular;  Laterality: N/A;   Patient Active Problem List   Diagnosis Date Noted   Moderate major depression, single episode (HCC) 04/14/2022   Arteriosclerosis of carotid artery 02/01/2022   Essential hypertension 02/01/2022   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) 02/01/2022   History of substance abuse (HCC) 02/01/2022   Iron deficiency anemia 02/01/2022   Late effects of cerebrovascular disease 02/01/2022   Obesity 02/01/2022   Pure hypercholesterolemia 02/01/2022   Pain due to onychomycosis of toenails of both feet 06/10/2021   Neuropathic pain 04/09/2020   Pain    Spastic hemiparesis (HCC)    History of hypertension    Dyslipidemia    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   Leukocytosis 01/12/2020   Cerebrovascular accident (CVA) due to thrombosis of right middle cerebral artery (HCC)    Chronic pain syndrome    Hypokalemia    Dysphagia, post-stroke    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/07/2020   Tobacco dependence    Marijuana dependence (HCC)    Alcohol dependence (HCC)     ONSET DATE: referral: 05/03/2023  REFERRING DIAG: W09.811 (ICD-10-CM) - Right middle cerebral artery stroke (HCC) G81.10 (ICD-10-CM) - Spastic hemiparesis (HCC)  DX: spasticity LUE s/p botox pecs and finger flexors on 02/09/23 - scheduled again next month (August) DX: CVA with spastic left hemiparesis RX: Eval and treat, resume therapies, bp better controlledDX: CVA with spastic left hemiparesis RX: Eval and treat, resume therapies, bp better controlled THERAPY DIAG:  Contracture, left hand  Pain in left arm  Spastic hemiplegia affecting nondominant side (HCC)  Rationale for Evaluation and Treatment: Rehabilitation  SUBJECTIVE:   SUBJECTIVE STATEMENT:  Patient asked about getting on the arm bike at the end of the session.  As OTR checked BP checked at end of session (125/88) - patient reported that he ate some cereal this morning with his medicine about 1 hour before therapy.  Pt accompanied by: self  PERTINENT HISTORY: Spastic L Hemiparesis.  PMH: R MCA CVA, Alcohol Dependence (occasional), Marijuana (NA)/Tobacco ([place]) Dependence, Back Pain  PRECAUTIONS: Other: no driving  WEIGHT BEARING RESTRICTIONS: No  PAIN:  Are you having pain? Yes: NPRS scale: 5/10 Pain location: LUE - shoulder Pain description: aching  Aggravating factors: laying on it Relieving factors: Pain patch (doesn't have rx anymore) stretches, pain meds  Vitals - BP 125/88   FALLS: Has patient fallen in last 6 months? No  LIVING ENVIRONMENT: Lives with:  roommate Lives in: House 8 steps to enter (B rails but uses R side rail going  up), 1 level home Has following equipment at home: Quad cane small base, Wheelchair (manual), Shower bench, and bed side commode - over the toilet in the bathroom, urinal at night   PLOF: Independent prior to stroke 2021, on disability  PATIENT GOALS: Improve Lt arm mobility and balance  OBJECTIVE:   HAND DOMINANCE: Right  ADLs: Overall ADLs: Has aide M-Sun 2-4 hrs/day to assist with bathing, and some dressing, cooking and cleaning Transfers/ambulation related to ADLs: mod I w/ quad cane Eating: mod I eating, difficulty cutting food and occasional assist from aide Grooming: mod I brushing teeth and shaving UB Dressing: mod I for pull over shirt, assist for button up shirt LB Dressing: Aide assist getting pants over feet and assist for donning shoes and socks Toileting: mod I Bathing: max assist from aide Tub Shower transfers: min assist for leg management with tub bench Equipment: Transfer tub bench, Grab bars, and hand held shower  IADLs: Shopping: has groceries delivered [employer]: aide does Meal Prep: aide or roommate does but can get snack or heat up something in microwave after someone gets it out of the fridge etc for him Community mobility: relies on friends Medication management: independent s/p aide sorting it into his pill box Financial management: roommate does Handwriting:  denies change  MOBILITY STATUS:  walks with quad cane in house and short distances   UPPER EXTREMITY ROM:  RUE AROM WNL's  LUE: Limited active movement - sh shrug w/ slight elbow flex dominated by synergy pattern  Passive ROM Right eval Left eval  Shoulder flexion  90*  Shoulder abduction  80*  Shoulder adduction    Shoulder extension    Shoulder internal rotation    Shoulder external rotation    Elbow flexion  90%  Elbow extension  90%  Wrist flexion  90%  Wrist extension  25%  Wrist ulnar deviation    Wrist radial deviation    Wrist pronation    Wrist supination  90%   (Blank rows = not tested)  Passive finger extension full with wrist flexion. Difficulty at PIP joint extension, especially LF - at rest, PIP flexed, DIP hyperextended  HAND FUNCTION: No function LT HAND  COORDINATION: No function Lt hand  SENSATION: Significantly diminished - cannot detect location, entire L UE is dull feeling  EDEMA: minimal Lt hand  MUSCLE TONE: LUE: Moderate, Hypertonic, and Modifed Ashworth Scale 2 = More marked increase in muscle tone through most of the ROM, but affected part(s) easily moved  COGNITION: Overall cognitive status:  pt reports memory deficits  VISION: Subjective report: still reports that he need glasses for reading.  Said he was 20/30 last time he went to the eye doctor but no changes in vision from stroke  Visual history:  none  PERCEPTION: Not tested  PRAXIS: Not tested  OBSERVATIONS: Pleasant gentlemen with somewhat flat affect and significant limitations with L UE ie) flickers of movement only noted although synergy patterns noted with yawning where L elbow flexed up to 90+ degrees.  TODAY'S TREATMENT:                                                                                                                               Self Care  OTR reviewed goals established at evaluation including splint splint wear, range of motion program and improved participation in ADLs including dressing activities.  Patient reports that he is able to don his shoes by leaving the shoelaces tied.  Patient demonstrated task with some minor adjustments by OTR including re-lacing his shoes to ensure the tongue does not slide into the shoe.  In addition patient is shown how to use the foot funnel as a possible option for donning shoes as well as education about the slide on sketcher shoes which are also helpful for donning shoes with limited hand dexterity.   Therapeutic Exercises Initaited self ROM porgram with table top activities demonstrated, and  practiced with hand over hand guidance to use R UE to help L arm through: - Seated Shoulder Flexion Towel Slide at Table Top  &  Seated Shoulder Scaption Slide at Table Top -- Patient assisted to complete table slides forward and back, as well as diagonally by Slowly lean forward and use his  right hand to help to slide his left hand and arm atop a towel across the table before returning to the starting position and repeating.  Patient reports remembering tis exercise from before. - Forearm Supination PROM -- This activity was combined with finger extension over a ball ie) rest hand on a ball, with fingers straight and firmly holding LEFT hand flat on the ball with RIGHT hand.  Then performing the movement of slowly turning his forearm out to the Left side as far as is comfortable while keep ing fingers as flat as possible on the ball.  PATIENT EDUCATION: Education details: reviewed OT goals and HEP initiated Person educated: Patient Education method: Explanation, Demonstration, Tactile cues, Verbal cues, and Handouts Education comprehension: verbalized understanding, returned demonstration, verbal cues required, tactile cues required, and needs further education  HOME EXERCISE PROGRAM: 07/01/23 - Access Code: [email or web address]  GOALS: Goals reviewed with patient? Yes  SHORT TERM GOALS: Target date:8/30//24  Independent with wear and care of resting hand splint for LUE up to daily. Baseline: 2-3x/week Goal status: INITIAL  2.  Independent HEP for LUE self stretches  Baseline: None established a OT POC 3 months ago Goal status: IN PROGRESS  3.  Pt to verbalize understanding with memory compensation strategies ie) binder for info Baseline: Self reported memory issues and limited access to past HEPs, medication list etc. Goal status: IN PROGRESS   LONG TERM GOALS: Target date: 08/12/23  Pt to verbalize understanding with potential A/E needs for one handed use to increase independence with ADLS  (rocker knife, shoe buttons, LH sponge, etc)  Baseline: Dependent on caregiver assistance for many aspects of ADLs Goal status: IN PROGRESS  2.  Pt to cut food I'ly with  A/E Baseline: Dependent on caregiver assistance for many aspects of ADLs Goal status: INITIAL  3.  Pt to consistently perform all UB dressing except buttons Baseline: Assisted by caregiver Goal status: INITIAL  4.  Pt to perform LB dressing w/ no more than min assist with A/E prn (tie shoelaces) Baseline: Assisted by caregiver Goal status: IN PROGRESS 07/01/23 - patient is able to don/remove regular shoes (no splint) with laces fastened.    ASSESSMENT:  CLINICAL IMPRESSION: Patient is a 47 y.o. male who was seen today for occupational therapy treatment for L hemiplegia Lt non dominant side from stroke in 2021. Pt resuming O.T. for developing stretching HEP and follow up on resting hand splint use to allow increased ROM and comfort of LUE as well as possible use at the gym and for increased ease with participation in ADLS.   PERFORMANCE DEFICITS: in functional skills including ADLs, IADLs, coordination, dexterity, sensation, tone, ROM, strength, pain, flexibility, Fine motor control, Gross motor control, mobility, body mechanics, decreased knowledge of use of DME, and UE functional use, cognitive skills including memory, and psychosocial skills including coping strategies.   IMPAIRMENTS: are limiting patient from ADLs, IADLs, and rest and sleep.   CO-MORBIDITIES: may have co-morbidities  that affects occupational performance. Patient will benefit from skilled OT to address above impairments and improve overall function.  REHAB POTENTIAL: Good for established goals   PLAN:  OT FREQUENCY: 2x/week  OT DURATION: 6 weeks (plus evaluation)  PLANNED INTERVENTIONS: self care/ADL training, therapeutic exercise, therapeutic activity, neuromuscular re-education, manual therapy, passive range of motion, functional mobility  training, splinting, moist heat, patient/family education, cognitive remediation/compensation, coping strategies training, and DME and/or AE instructions  RECOMMENDED OTHER SERVICES: P.T evaluating patient today also.   CONSULTED AND AGREED WITH PLAN OF CARE: Patient  PLAN FOR NEXT SESSION:  Check resting hand splint (may need to call to remind to bring)  Begin folder/binder for collection of info needed to monitor BP, list medications, collect AE info, combine HEP ideas.  Progress and print self PROM exercises for LUE.  Would like to try [employer] for UEs - provided info re: Active Hand options.   Victorino Sparrow, OT 07/01/2023, 2:20 PM

## 2023-07-06 ENCOUNTER — Ambulatory Visit: Payer: Medicare HMO | Admitting: Physical Therapy

## 2023-07-06 ENCOUNTER — Encounter: Payer: Self-pay | Admitting: Physical Therapy

## 2023-07-06 ENCOUNTER — Ambulatory Visit: Payer: Medicare HMO | Attending: Physical Medicine & Rehabilitation | Admitting: Occupational Therapy

## 2023-07-06 VITALS — BP 135/98 | HR 60

## 2023-07-06 DIAGNOSIS — M24542 Contracture, left hand: Secondary | ICD-10-CM | POA: Diagnosis not present

## 2023-07-06 DIAGNOSIS — R293 Abnormal posture: Secondary | ICD-10-CM | POA: Diagnosis not present

## 2023-07-06 DIAGNOSIS — R2689 Other abnormalities of gait and mobility: Secondary | ICD-10-CM

## 2023-07-06 DIAGNOSIS — R208 Other disturbances of skin sensation: Secondary | ICD-10-CM | POA: Insufficient documentation

## 2023-07-06 DIAGNOSIS — R2681 Unsteadiness on feet: Secondary | ICD-10-CM

## 2023-07-06 DIAGNOSIS — M6281 Muscle weakness (generalized): Secondary | ICD-10-CM | POA: Insufficient documentation

## 2023-07-06 DIAGNOSIS — I69354 Hemiplegia and hemiparesis following cerebral infarction affecting left non-dominant side: Secondary | ICD-10-CM

## 2023-07-06 DIAGNOSIS — G8110 Spastic hemiplegia affecting unspecified side: Secondary | ICD-10-CM | POA: Insufficient documentation

## 2023-07-06 DIAGNOSIS — M79602 Pain in left arm: Secondary | ICD-10-CM | POA: Insufficient documentation

## 2023-07-06 NOTE — Therapy (Unsigned)
OUTPATIENT OCCUPATIONAL THERAPY NEURO TREATMENT  Patient Name: Derek [name] MRN: [id number] DOB:24-Oct-1976, 47 y.o., male Today's Date: 07/06/2023  PCP: Aliene Beams, MD REFERRING PROVIDER: Ranelle Oyster, MD  END OF SESSION:  OT End of Session - 07/06/23 1329     Visit Number 3    Number of Visits 12   + evaluation   Date for OT Re-Evaluation 08/12/23    Authorization Type [employer]    Progress Note Due on Visit 10    OT Start Time 1318    OT Stop Time 1400    OT Time Calculation (min) 42 min    Equipment Utilized During Treatment UBE, Active Hand    Activity Tolerance Patient tolerated treatment well    Behavior During Therapy [facility] for tasks assessed/performed             Past Medical History:  Diagnosis Date   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/07/2020   Alcohol dependence (HCC)    Back pain    Marijuana dependence (HCC)    Tobacco dependence    Past Surgical History:  Procedure Laterality Date   BUBBLE STUDY  01/10/2020   Procedure: BUBBLE STUDY;  Surgeon: Sande Rives, MD;  Location: 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) 02/01/2022   History of substance abuse (HCC) 02/01/2022   Iron deficiency anemia 02/01/2022   Late effects of cerebrovascular disease 02/01/2022   Obesity 02/01/2022   Pure hypercholesterolemia 02/01/2022   Pain due to onychomycosis of toenails of both feet 06/10/2021   Neuropathic pain 04/09/2020   Pain    Spastic hemiparesis (HCC)     History of hypertension    Dyslipidemia    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   Leukocytosis 01/12/2020   Cerebrovascular accident (CVA) due to thrombosis of right middle cerebral artery (HCC)    Chronic pain syndrome    Hypokalemia    Dysphagia, post-stroke    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/07/2020   Tobacco dependence    Marijuana dependence (HCC)    Alcohol dependence (HCC)     ONSET DATE: referral: 05/03/2023  REFERRING DIAG: O53.664 (ICD-10-CM) - Right middle cerebral artery stroke (HCC) G81.10 (ICD-10-CM) - Spastic hemiparesis (HCC)  DX: spasticity LUE s/p botox pecs and finger flexors on 02/09/23 - scheduled again next month (August) DX: CVA with spastic left hemiparesis RX: Eval and treat, resume therapies, bp better controlledDX: CVA with spastic left hemiparesis RX: Eval and treat, resume therapies, bp better controlled THERAPY DIAG:  Spastic hemiplegia affecting nondominant side (HCC)  Contracture, left hand  Pain in left arm  Muscle weakness  Rationale for Evaluation and Treatment: Rehabilitation  SUBJECTIVE:   SUBJECTIVE STATEMENT:  Patient reports doing fine today.  He brought his splints with him.  He is not able to wear is custom splint at night as it wakes him if he bumps himself with the hard (thermoplastic) material.  His Comfy splint straps are overstretched and the cover is starting to wear thin.  He believes he has hand that splint for  a couple of years now and is open to seeing if he qualifies for a new pre-fab splint that is more accommodating and comfortable.   Pt accompanied by: self  PERTINENT HISTORY: Spastic L Hemiparesis.  PMH: R MCA CVA, Alcohol Dependence (occasional), Marijuana (NA)/Tobacco ([place]) Dependence, Back Pain  PRECAUTIONS: Other: no driving  WEIGHT BEARING RESTRICTIONS: No  PAIN:  Are you having pain? Yes: NPRS scale: 5/10 Pain location: LUE - shoulder Pain description: aching  Aggravating  factors: laying on it Relieving factors: Pain patch (doesn't have rx anymore) stretches, pain meds  FALLS: Has patient fallen in last 6 months? No  LIVING ENVIRONMENT: Lives with:  roommate Lives in: House 8 steps to enter (B rails but uses R side rail going up), 1 level home Has following equipment at home: Quad cane small base, Wheelchair (manual), Shower bench, and bed side commode - over the toilet in the bathroom, urinal at night   PLOF: Independent prior to stroke 2021, on disability  PATIENT GOALS: Improve Lt arm mobility and balance  OBJECTIVE:   HAND DOMINANCE: Right  ADLs: Overall ADLs: Has aide M-Sun 2-4 hrs/day to assist with bathing, and some dressing, cooking and cleaning Transfers/ambulation related to ADLs: mod I w/ quad cane Eating: mod I eating, difficulty cutting food and occasional assist from aide Grooming: mod I brushing teeth and shaving UB Dressing: mod I for pull over shirt, assist for button up shirt LB Dressing: Aide assist getting pants over feet and assist for donning shoes and socks Toileting: mod I Bathing: max assist from aide Tub Shower transfers: min assist for leg management with tub bench Equipment: Transfer tub bench, Grab bars, and hand held shower  IADLs: Shopping: has groceries delivered [employer]: aide does Meal Prep: aide or roommate does but can get snack or heat up something in microwave after someone gets it out of the fridge etc for him Community mobility: relies on friends Medication management: independent s/p aide sorting it into his pill box Financial management: roommate does Handwriting:  denies change  MOBILITY STATUS:  walks with quad cane in house and short distances   UPPER EXTREMITY ROM:  RUE AROM WNL's  LUE: Limited active movement - sh shrug w/ slight elbow flex dominated by synergy pattern  Passive ROM Right eval Left eval  Shoulder flexion  90*  Shoulder abduction  80*  Shoulder adduction     Shoulder extension    Shoulder internal rotation    Shoulder external rotation    Elbow flexion  90%  Elbow extension  90%  Wrist flexion  90%  Wrist extension  25%  Wrist ulnar deviation    Wrist radial deviation    Wrist pronation    Wrist supination  90%  (Blank rows = not tested)  Passive finger extension full with wrist flexion. Difficulty at PIP joint extension, especially LF - at rest, PIP flexed, DIP hyperextended  HAND FUNCTION: No function LT HAND  COORDINATION: No function Lt hand  SENSATION: Significantly diminished - cannot detect location, entire L UE is dull feeling  EDEMA: minimal Lt hand  MUSCLE TONE: LUE: Moderate, Hypertonic, and Modifed Ashworth Scale 2 = More marked increase in muscle tone through most of the ROM, but affected part(s) easily moved  COGNITION: Overall cognitive status:  pt reports memory deficits  VISION: Subjective report: still reports that he need glasses for reading.  Said he was 20/30 last time he went to the eye doctor but no changes in vision from  stroke  Visual history:  none  PERCEPTION: Not tested  PRAXIS: Not tested  OBSERVATIONS: Pleasant gentlemen with somewhat flat affect and significant limitations with L UE ie) flickers of movement only noted although synergy patterns noted with yawning where L elbow flexed up to 90+ degrees.   TODAY'S TREATMENT:                                                                                                                              Splint Patient brought his custom thermoplastic splint which is large and unable to be worn at night by patient due to discomfort if he hits himself with it.  He had an old/well used Comfy resting hand splint but the thumb pan is difficult to readjust as is the distal finger pan to prevent hyper extension of DIP joints.  In addition the straps are barely effective due to being overstretched and difficult to fasten. Patient may benefit from different  prefab splint (RMI BendEase) if eligible - OTR to check with orthotist.  Manual Techniques Manual Techniques completed for L shoulder and back stiffness. Active Release Techniques used in sitting where trigger points, palpable tightness and adhesions in shoulder and upper back, were located through touch, and then deep pressure to these areas were coupled with patient's slow active movements to help relax, release and improve ROM in musculature.  In addition, patient shown how to use a thera-cane to apply force to deep fascia and trigger points of back while seated using R hand on the cane to reach behind his shoulder with end of device.  Image printed for home use consideration.   Therapeutic Exercises Pt completed arm bike in sitting for 11 minutes with distance of 2/10 of a mile over 11 minutes for endurance, ROM, and strengthening of affected extremity. Pt had difficulty alternating direction of pedaling halfway and propelled forward throughout. Utilized Active Hand grip on L hand for consistent grasp maintenance with good success.  Patient had improved speed s/p manual techniques around shoulder to loosen his L side and allow more consistent forward motion.  Handout provided on less expensive version of Active hand device for home use consideration.   PATIENT EDUCATION: Education details: Thera-cane for trigger point release and muscle relaxation Person educated: Patient Education method: Explanation, Demonstration, Tactile cues, Verbal cues, and Handouts Education comprehension: verbalized understanding, returned demonstration, verbal cues required, tactile cues required, and needs further education  HOME EXERCISE PROGRAM: 07/01/23 - Access Code: [email or web address]  GOALS: Goals reviewed with patient? Yes  SHORT TERM GOALS: Target date:8/30//24  Independent with wear and care of resting hand splint for LUE up to daily. Baseline: 2-3x/week Goal status: IN PROGRESS  2.  Independent HEP for LUE  self stretches  Baseline: None established a OT POC 3 months ago Goal status: IN PROGRESS  3.  Pt to verbalize understanding with memory compensation strategies ie) binder for info Baseline: Self reported memory issues and limited access to past  HEPs, medication list etc. Goal status: IN PROGRESS   LONG TERM GOALS: Target date: 08/12/23  Pt to verbalize understanding with potential A/E needs for one handed use to increase independence with ADLS (rocker knife, shoe buttons, LH sponge, etc)  Baseline: Dependent on caregiver assistance for many aspects of ADLs Goal status: IN PROGRESS  2.  Pt to cut food I'ly with A/E Baseline: Dependent on caregiver assistance for many aspects of ADLs Goal status: INITIAL  3.  Pt to consistently perform all UB dressing except buttons Baseline: Assisted by caregiver Goal status: INITIAL  4.  Pt to perform LB dressing w/ no more than min assist with A/E prn (tie shoelaces) Baseline: Assisted by caregiver Goal status: MET 07/01/23 - patient is able to don/remove regular shoes (no splint) with laces fastened.    ASSESSMENT:  CLINICAL IMPRESSION: Patient is a 47 y.o. male who was seen today for occupational therapy treatment for L hemiplegia Lt non dominant side from stroke in 2021. Pt would benefit from pre-fab resting hand splint for increased comfort, tolerance and management of L hand contracture ie) minimize DIP hyperextension. Patient responded well to UBE exercise machine for increased ROM with good comfort of LUE - with possible use at the gym.  PERFORMANCE DEFICITS: in functional skills including ADLs, IADLs, coordination, dexterity, sensation, tone, ROM, strength, pain, flexibility, Fine motor control, Gross motor control, mobility, body mechanics, decreased knowledge of use of DME, and UE functional use, cognitive skills including memory, and psychosocial skills including coping strategies.   IMPAIRMENTS: are limiting patient from ADLs, IADLs,  and rest and sleep.   CO-MORBIDITIES: may have co-morbidities  that affects occupational performance. Patient will benefit from skilled OT to address above impairments and improve overall function.  REHAB POTENTIAL: Good for established goals   PLAN:  OT FREQUENCY: 2x/week  OT DURATION: 6 weeks (plus evaluation)  PLANNED INTERVENTIONS: self care/ADL training, therapeutic exercise, therapeutic activity, neuromuscular re-education, manual therapy, passive range of motion, functional mobility training, splinting, moist heat, patient/family education, cognitive remediation/compensation, coping strategies training, and DME and/or AE instructions  RECOMMENDED OTHER SERVICES: P.T evaluating patient today also.   CONSULTED AND AGREED WITH PLAN OF CARE: Patient  PLAN FOR NEXT SESSION:  ADL check - cutting food, dressing UB  Check on possible BendEase resting hand splint  Progress and print self PROM exercises for LUE.  Continue [employer] for UEs - check on Active Hand options provided.  Begin folder/binder for collection of info needed to monitor BP, list medications, collect AE info, combine HEP ideas.  Victorino Sparrow, OT 07/06/2023, 5:17 PM

## 2023-07-06 NOTE — Therapy (Signed)
OUTPATIENT PHYSICAL THERAPY NEURO TREATMENT   Patient Name: Derek [name] MRN: [id number] DOB:[birth year], 47 y.o., male Today's Date: 07/06/2023   PCP: Aliene Beams, MD REFERRING PROVIDER: Ranelle Oyster, MD  END OF SESSION:  PT End of Session - 07/06/23 1407     Visit Number 3    Number of Visits 9   8 + eval   Date for PT Re-Evaluation 08/05/23   pushed out due to multi-D scheduling delay   Authorization Type [employer]IC[employer]    Progress Note Due on Visit 10    PT Start Time 1403    PT Stop Time 1445    PT Time Calculation (min) 42 min    Equipment Utilized During Treatment Gait belt    Activity Tolerance Patient tolerated treatment well    Behavior During Therapy [facility] for tasks assessed/performed            Past Medical History:  Diagnosis Date   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/07/2020   Alcohol dependence (HCC)    Back pain    Marijuana dependence (HCC)    Tobacco dependence    Past Surgical History:  Procedure Laterality Date   BUBBLE STUDY  01/10/2020   Procedure: BUBBLE STUDY;  Surgeon: Sande Rives, MD;  Location: [facility] ENDOSCOPY;  Service: Cardiovascular;;   TEE WITHOUT CARDIOVERSION N/A 01/10/2020   Procedure: TRANSESOPHAGEAL ECHOCARDIOGRAM (TEE);  Surgeon: Sande Rives, MD;  Location: [facility] ENDOSCOPY;  Service: Cardiovascular;  Laterality: N/A;   Patient Active Problem List   Diagnosis Date Noted   Moderate major depression, single episode (HCC) 04/14/2022   Arteriosclerosis of carotid artery 02/01/2022   Essential hypertension 02/01/2022   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) 02/01/2022   History of substance abuse (HCC) 02/01/2022   Iron deficiency anemia 02/01/2022   Late effects of cerebrovascular disease 02/01/2022   Obesity 02/01/2022   Pure hypercholesterolemia 02/01/2022   Pain due to onychomycosis of toenails of both feet 06/10/2021   Neuropathic pain 04/09/2020   Pain     Spastic hemiparesis (HCC)    History of hypertension    Dyslipidemia    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   Leukocytosis 01/12/2020   Cerebrovascular accident (CVA) due to thrombosis of right middle cerebral artery (HCC)    Chronic pain syndrome    Hypokalemia    Dysphagia, post-stroke    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/07/2020   Tobacco dependence    Marijuana dependence (HCC)    Alcohol dependence (HCC)     ONSET DATE: 2021 (CVA)  REFERRING DIAG: I63.511 (ICD-10-CM) - Right middle cerebral artery stroke (HCC) G81.10 (ICD-10-CM) - Spastic hemiparesis (HCC)  THERAPY DIAG:  Other abnormalities of gait and mobility  Unsteadiness on feet  Muscle weakness (generalized)  Spastic hemiplegia of left nondominant 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 [facility])  Rationale for Evaluation and Treatment: Rehabilitation  SUBJECTIVE:  SUBJECTIVE STATEMENT: Patient received from OT, using LBQC to ambulate.  He denies recent falls or acute changes. Pt accompanied by: self  PERTINENT HISTORY: R MCA CVA, Alcohol Dependence, Marijuana/Tobacco Dependence, Back Pain   PAIN:  Are you having pain? Yes: NPRS scale: 5/10 Pain location: L shoulder Pain description: aching Aggravating factors: laying on L side Relieving factors: pain medicine or patches  Today's Vitals   07/06/23 1406  BP: (!) 135/98  Pulse: 60   - see OT note.  PRECAUTIONS: Fall and Other: L spastic hemi  WEIGHT BEARING RESTRICTIONS: No  FALLS: Has patient fallen in last 6 months? No  LIVING ENVIRONMENT: Lives with:  roommate Lives in: House/apartment Stairs: Yes: External: 8 steps; bilateral but cannot reach both Has following equipment at home: Quad cane large base, Hemi walker, Wheelchair (manual),  shower chair, and Grab bars  PLOF: Independent with household mobility with device, Needs assistance with ADLs, and Needs assistance with homemaking-aide comes in the mornings for 4 hours on weekdays and 2 hours on weekends  PATIENT GOALS: "to get some mobility on my left side"  "to be able to stand longer and walk easier"  OBJECTIVE:   DIAGNOSTIC FINDINGS: 01/09/20 head CT: IMPRESSION: 1. Evolving right cerebral infarct with evidence for hemorrhagic transformation at the right basal ganglia, similar in appearance and distribution as compared to previous MRI. Associated mass effect with 3 mm of right-to-left shift. No hydrocephalus or ventricular trapping. 2. No other new acute intracranial abnormality.  COGNITION: Overall cognitive status: Within functional limits for tasks assessed   SENSATION: WFL  COORDINATION: LE RAMS:  limited by lack of DF AROM on L Heel-to-shin:  can cross left to right, but cannot bring heel directly to shin   MUSCLE TONE: LLE: Hypertonic Clonus present  POSTURE: rounded shoulders, forward head, increased thoracic kyphosis, posterior pelvic tilt, left pelvic obliquity, and weight shift right  LOWER EXTREMITY MMT:    MMT Right Eval Left Eval  Hip flexion 5 2-  Hip extension    Hip abduction 5 3  Hip adduction 5 3  Hip internal rotation    Hip external rotation    Knee flexion 4 2+/5  Knee extension 5 3 w/ increased time he can achieve full extension  Ankle dorsiflexion 5 0  Ankle plantarflexion    Ankle inversion    Ankle eversion    (Blank rows = not tested)  BED MOBILITY:  Independent per patient report - he reports his bed is lower which helps  TRANSFERS: Assistive device utilized: Quad cane large base  Sit to stand: SBA Stand to sit: SBA Chair to chair: SBA  GAIT: Gait pattern: step to pattern, decreased arm swing- Left, decreased step length- Right, decreased stance time- Left, decreased hip/knee flexion- Left, decreased ankle  dorsiflexion- Left, circumduction- Left, Left hip hike, lateral lean- Right, trunk rotated posterior- Left, and poor foot clearance- Left Distance walked: various clinic distances Assistive device utilized: Quad cane large base Level of assistance: SBA Comments:  LUE held in flexion and IR and LLE lacking heel strike due to PF tone.  Gentle L knee flexion maintained in stance due to spasticity.  FUNCTIONAL TESTS:  5 times sit to stand: 18.53 seconds w/ RUE support and right lateral lean Timed up and go (TUG): 22.69 seconds w/ CGA and LBQC Berg Balance Scale: To be finished next session.  PATIENT SURVEYS:  FOTO not captured due to chronicity of CVA.  TODAY'S TREATMENT:    RUE in sitting prior to  session:                                                                                                                           Today's Vitals   07/06/23 1406  BP: (!) 135/98  Pulse: 60   There is no height or weight on file to calculate BMI.  -5xSTS:  19.28 sec w/ RUE support and right weight shift (second attempt); pt had difficulty rising on initial attempt -TUG w/ LBQC:  23.81 sec SBA  Finished HEP review: -Seated heel slide using towel to promote frictionless surface 2x8, had pt use RUE to maintain knee in midline during motion to prevent excess compensation -Left hamstring stretch 2x42minute using RUE to maintain knee extension and PT propping toe into DF for deeper stretch - discussed using wall/counter side/table leg/chair back to prop left toe up and mimic this support at home. -Heel cord stretch in sitting - provided mobilization band tied in loop for ease of performance at home, pt able to demonstrate proper use and band providing adequate support 2x30 seconds; practiced w/ gait belt x30 seconds prior; cued to bring foot more into eversion than pulling into inversion using direction of strap -Countertop weight shift w/ emphasis on keeping L heel aligned with R and increased width of  BOS to improve challenge of weight shift, cued for left "soft" knee for quad control -PT performs hamstring and DF stretch in supine x1 minute each  PATIENT EDUCATION: Education details: Monitor BP as able.  Continue HEP- focus on stretching and walking in home if nothing else. Person educated: Patient Education method: Explanation Education comprehension: verbalized understanding and needs further education  HOME EXERCISE PROGRAM: [id number]   GOALS: Goals reviewed with patient? Yes  SHORT TERM GOALS: Target date: 06/24/2023  Pt will be independent and compliant with strengthening, stretching, and balance focused HEP in order to maintain functional progress and improve mobility. Baseline:  Reviewed (9/4) Goal status: IN PROGRESS  2.  Pt will decrease 5xSTS to </=15.53 seconds using RUE support as needed with improved midline in order to demonstrate decreased risk for falls and improved functional bilateral LE strength and power. Baseline: 18.53 sec w/ RUE support and right weight shift; 19.28 sec w/ RUE support and right weight shift (9/4) Goal status: NOT MET  3.  Pt will demonstrate TUG of </=18.69 seconds at SBA level in order to decrease risk of falls and improve functional mobility using LRAD. Baseline: 22.69 sec CGA w/ LBQC; 23.81 sec SBA w/ LBQC (9/4) Goal status: NOT MET  LONG TERM GOALS: Target date: 07/22/2023 (Note:  due to scheduling, likely assess single goal at LTG and update date for last visit at 10 week mark)  Pt will decrease 5xSTS to </=12.53 seconds using RUE support as needed with improved midline in order to demonstrate decreased risk for falls and improved functional bilateral LE strength and power. Baseline: 18.53 sec w/ RUE support and right weight shift Goal status: INITIAL  2.  Pt will demonstrate TUG of </=14.69 seconds at SBA level in order to decrease risk of falls and improve functional mobility using LRAD. Baseline: 22.69 sec CGA w/ LBQC Goal status:  INITIAL  3.  Pt will increase BERG balance score to at least 41/56 to demonstrate improved static balance. Baseline: 36/56 (8/30) Goal status: INITIAL  4.  Pt will ambulate >/=250 feet with LBQC and no more than SBA w/o increased shoulder pain and improved left LE control to promote household and community access.  Baseline:  L shoulder pain 4-5/10, left knee flexion in stance  Goal status:  INITIAL  ASSESSMENT:  CLINICAL IMPRESSION: Patient has made no significant progress towards STGs as assessed today.  This is to be expected based on limited sessions prior due to ongoing hypertension.  He is appearing to be mildly better managed in this aspect.  Completed review of prior HEP which remains appropriate for patient level and severity and chronicity of deficits.  He continues to benefit from skilled PT intervention to further address chronic left hemiparesis and improved mobility.  OBJECTIVE IMPAIRMENTS: Abnormal gait, decreased activity tolerance, decreased balance, decreased coordination, decreased endurance, decreased knowledge of condition, decreased knowledge of use of DME, difficulty walking, decreased strength, impaired perceived functional ability, impaired tone, impaired UE functional use, postural dysfunction, and pain.   ACTIVITY LIMITATIONS: carrying, lifting, bending, standing, squatting, stairs, bathing, dressing, hygiene/grooming, locomotion level, and caring for others  PARTICIPATION LIMITATIONS: meal prep, cleaning, interpersonal relationship, driving, shopping, community activity, and occupation  PERSONAL FACTORS: Behavior pattern, Fitness, Past/current experiences, Time since onset of injury/illness/exacerbation, Transportation, and 3+ comorbidities: tobacco dependence, HTN, ETOH, chronic back pain  are also affecting patient's functional outcome.   REHAB POTENTIAL: Fair See PMH and personal factors  CLINICAL DECISION MAKING: Evolving/moderate complexity  EVALUATION  COMPLEXITY: Moderate  PLAN:  PT FREQUENCY: 1x/week  PT DURATION: 10 weeks (will initially plan for 8 weeks)  PLANNED INTERVENTIONS: Therapeutic exercises, Therapeutic activity, Neuromuscular re-education, Balance training, Gait training, Patient/Family education, Self Care, Joint mobilization, Stair training, Vestibular training, Visual/preceptual remediation/compensation, Orthotic/Fit training, DME instructions, Aquatic Therapy, Electrical stimulation, Taping, Manual therapy, and Re-evaluation  PLAN FOR NEXT SESSION: ASSESS R BP!  Modify HEP prn for LLE strengthening, static and dynamic balance, spasticity management (stretching).  Gait training/pre-gait - pt uses LBQC, left quad control  Sadie Haber, PT, DPT 07/06/2023, 2:49 PM

## 2023-07-13 ENCOUNTER — Encounter: Payer: Self-pay | Admitting: Physical Therapy

## 2023-07-13 ENCOUNTER — Ambulatory Visit: Payer: Medicare HMO | Admitting: Physical Therapy

## 2023-07-13 ENCOUNTER — Ambulatory Visit: Payer: Medicare HMO | Admitting: Occupational Therapy

## 2023-07-13 VITALS — BP 160/96 | HR 59

## 2023-07-13 DIAGNOSIS — I69354 Hemiplegia and hemiparesis following cerebral infarction affecting left non-dominant side: Secondary | ICD-10-CM | POA: Diagnosis not present

## 2023-07-13 DIAGNOSIS — R2689 Other abnormalities of gait and mobility: Secondary | ICD-10-CM | POA: Diagnosis not present

## 2023-07-13 DIAGNOSIS — R293 Abnormal posture: Secondary | ICD-10-CM | POA: Diagnosis not present

## 2023-07-13 DIAGNOSIS — R2681 Unsteadiness on feet: Secondary | ICD-10-CM | POA: Diagnosis not present

## 2023-07-13 DIAGNOSIS — G8110 Spastic hemiplegia affecting unspecified side: Secondary | ICD-10-CM | POA: Diagnosis not present

## 2023-07-13 DIAGNOSIS — M24542 Contracture, left hand: Secondary | ICD-10-CM | POA: Diagnosis not present

## 2023-07-13 DIAGNOSIS — M6281 Muscle weakness (generalized): Secondary | ICD-10-CM

## 2023-07-13 DIAGNOSIS — M79602 Pain in left arm: Secondary | ICD-10-CM | POA: Diagnosis not present

## 2023-07-13 DIAGNOSIS — R208 Other disturbances of skin sensation: Secondary | ICD-10-CM | POA: Diagnosis not present

## 2023-07-13 NOTE — Therapy (Signed)
OUTPATIENT PHYSICAL THERAPY NEURO TREATMENT   Patient Name: Derek [name] MRN: [id number] DOB:[birth year], 47 y.o., male Today's Date: 07/13/2023   PCP: Aliene Beams, MD REFERRING PROVIDER: Ranelle Oyster, MD  END OF SESSION:  PT End of Session - 07/13/23 1335     Visit Number 4    Number of Visits 9   8 + eval   Date for PT Re-Evaluation 08/05/23   pushed out due to multi-D scheduling delay   Authorization Type [employer] MEDICARE    Progress Note Due on Visit 10    PT Start Time 1319    PT Stop Time 1400    PT Time Calculation (min) 41 min    Equipment Utilized During Treatment Gait belt    Activity Tolerance Patient tolerated treatment well;Treatment limited secondary to medical complications (Comment);Other (comment)   HTN   Behavior During Therapy WFL for tasks assessed/performed            Past Medical History:  Diagnosis Date   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/07/2020   Alcohol dependence (HCC)    Back pain    Marijuana dependence (HCC)    Tobacco dependence    Past Surgical History:  Procedure Laterality Date   BUBBLE STUDY  01/10/2020   Procedure: BUBBLE STUDY;  Surgeon: Sande Rives, MD;  Location: [facility] ENDOSCOPY;  Service: Cardiovascular;;   TEE WITHOUT CARDIOVERSION N/A 01/10/2020   Procedure: TRANSESOPHAGEAL ECHOCARDIOGRAM (TEE);  Surgeon: Sande Rives, MD;  Location: [facility] ENDOSCOPY;  Service: Cardiovascular;  Laterality: N/A;   Patient Active Problem List   Diagnosis Date Noted   Moderate major depression, single episode (HCC) 04/14/2022   Arteriosclerosis of carotid artery 02/01/2022   Essential hypertension 02/01/2022   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) 02/01/2022   History of substance abuse (HCC) 02/01/2022   Iron deficiency anemia 02/01/2022   Late effects of cerebrovascular disease 02/01/2022   Obesity 02/01/2022   Pure hypercholesterolemia 02/01/2022   Pain due to  onychomycosis of toenails of both feet 06/10/2021   Neuropathic pain 04/09/2020   Pain    Spastic hemiparesis (HCC)    History of hypertension    Dyslipidemia    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   Leukocytosis 01/12/2020   Cerebrovascular accident (CVA) due to thrombosis of right middle cerebral artery (HCC)    Chronic pain syndrome    Hypokalemia    Dysphagia, post-stroke    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/07/2020   Tobacco dependence    Marijuana dependence (HCC)    Alcohol dependence (HCC)     ONSET DATE: 2021 (CVA)  REFERRING DIAG: I63.511 (ICD-10-CM) - Right middle cerebral artery stroke (HCC) G81.10 (ICD-10-CM) - Spastic hemiparesis (HCC)  THERAPY DIAG:  Other abnormalities of gait and mobility  Unsteadiness on feet  Muscle weakness (generalized)  Spastic hemiplegia of left nondominant 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 [facility])  Rationale for Evaluation and Treatment: Rehabilitation  SUBJECTIVE:  SUBJECTIVE STATEMENT: Patient ambulates into gym w/ LBQC.  He denies falls or acute changes.  Pt requests to practice floor recovery. Pt accompanied by: self  PERTINENT HISTORY: R MCA CVA, Alcohol Dependence, Marijuana/Tobacco Dependence, Back Pain   PAIN:  Are you having pain? No  Today's Vitals   07/13/23 1326 07/13/23 1334  BP: (!) 157/100 (!) 160/96  Pulse: (!) 59 (!) 59    PRECAUTIONS: Fall and Other: L spastic hemi  WEIGHT BEARING RESTRICTIONS: No  FALLS: Has patient fallen in last 6 months? No  LIVING ENVIRONMENT: Lives with:  roommate Lives in: House/apartment Stairs: Yes: External: 8 steps; bilateral but cannot reach both Has following equipment at home: Quad cane large base, Hemi walker, Wheelchair (manual), shower chair, and Grab  bars  PLOF: Independent with household mobility with device, Needs assistance with ADLs, and Needs assistance with homemaking-aide comes in the mornings for 4 hours on weekdays and 2 hours on weekends  PATIENT GOALS: "to get some mobility on my left side"  "to be able to stand longer and walk easier"  OBJECTIVE:   DIAGNOSTIC FINDINGS: 01/09/20 head CT: IMPRESSION: 1. Evolving right cerebral infarct with evidence for hemorrhagic transformation at the right basal ganglia, similar in appearance and distribution as compared to previous MRI. Associated mass effect with 3 mm of right-to-left shift. No hydrocephalus or ventricular trapping. 2. No other new acute intracranial abnormality.  COGNITION: Overall cognitive status: Within functional limits for tasks assessed   SENSATION: WFL  COORDINATION: LE RAMS:  limited by lack of DF AROM on L Heel-to-shin:  can cross left to right, but cannot bring heel directly to shin   MUSCLE TONE: LLE: Hypertonic Clonus present  POSTURE: rounded shoulders, forward head, increased thoracic kyphosis, posterior pelvic tilt, left pelvic obliquity, and weight shift right  LOWER EXTREMITY MMT:    MMT Right Eval Left Eval  Hip flexion 5 2-  Hip extension    Hip abduction 5 3  Hip adduction 5 3  Hip internal rotation    Hip external rotation    Knee flexion 4 2+/5  Knee extension 5 3 w/ increased time he can achieve full extension  Ankle dorsiflexion 5 0  Ankle plantarflexion    Ankle inversion    Ankle eversion    (Blank rows = not tested)  BED MOBILITY:  Independent per patient report - he reports his bed is lower which helps  TRANSFERS: Assistive device utilized: Quad cane large base  Sit to stand: SBA Stand to sit: SBA Chair to chair: SBA  GAIT: Gait pattern: step to pattern, decreased arm swing- Left, decreased step length- Right, decreased stance time- Left, decreased hip/knee flexion- Left, decreased ankle dorsiflexion- Left,  circumduction- Left, Left hip hike, lateral lean- Right, trunk rotated posterior- Left, and poor foot clearance- Left Distance walked: various clinic distances Assistive device utilized: Quad cane large base Level of assistance: SBA Comments:  LUE held in flexion and IR and LLE lacking heel strike due to PF tone.  Gentle L knee flexion maintained in stance due to spasticity.  FUNCTIONAL TESTS:  5 times sit to stand: 18.53 seconds w/ RUE support and right lateral lean Timed up and go (TUG): 22.69 seconds w/ CGA and LBQC Berg Balance Scale: To be finished next session.  PATIENT SURVEYS:  FOTO not captured due to chronicity of CVA.  TODAY'S TREATMENT:    RUE in sitting prior to session:  Today's Vitals   07/13/23 1326 07/13/23 1334  BP: (!) 157/100 (!) 160/96  Pulse: (!) 59 (!) 59   Floor Recovery: Patient educated in floor recovery this visit using teach-back for injury assessment and sequencing of task in clinic setting.  Discussion of transfer of skills to variable scenarios outside the clinic.  Patient has most difficulty with left LE motor control for placement and crawling and nonfunctional use of LUE creating additional obstacle for management during recovery.  Performed 1 time.  Had pt simulate crawling on mat table w/o ability to position and advance LLE in tripod position and severe instability when PT assisting with LE placement.  Extensive education on needing to carry phone and call for help when fall occurs. Caregiver Training:  Caregiver not present. Level of Assist:  MinA and ModA.    Standing facing decline feet apart eyes closed x1 minute > semi-tandem eyes open x 1 minute each LE > semi-tandem eyes closed x 1 minute each LE (min-mod sway throughout)  Using R parallel bar pt performs closed chain lunges x15 each LE in rear, PT facilitates left heel  placement on return to upright to prevent out-toeing  PATIENT EDUCATION: Education details: Monitor BP as able.  Continue HEP- focus on stretching and walking in home if nothing else. Fall recovery. Person educated: Patient Education method: Explanation Education comprehension: verbalized understanding and needs further education  HOME EXERCISE PROGRAM: [email or web address]   GOALS: Goals reviewed with patient? Yes  SHORT TERM GOALS: Target date: 06/24/2023  Pt will be independent and compliant with strengthening, stretching, and balance focused HEP in order to maintain functional progress and improve mobility. Baseline:  Reviewed (9/4) Goal status: IN PROGRESS  2.  Pt will decrease 5xSTS to </=15.53 seconds using RUE support as needed with improved midline in order to demonstrate decreased risk for falls and improved functional bilateral LE strength and power. Baseline: 18.53 sec w/ RUE support and right weight shift; 19.28 sec w/ RUE support and right weight shift (9/4) Goal status: NOT MET  3.  Pt will demonstrate TUG of </=18.69 seconds at SBA level in order to decrease risk of falls and improve functional mobility using LRAD. Baseline: 22.69 sec CGA w/ LBQC; 23.81 sec SBA w/ LBQC (9/4) Goal status: NOT MET  LONG TERM GOALS: Target date: 07/22/2023 (Note:  due to scheduling, likely assess single goal at LTG and update date for last visit at 10 week mark)  Pt will decrease 5xSTS to </=12.53 seconds using RUE support as needed with improved midline in order to demonstrate decreased risk for falls and improved functional bilateral LE strength and power. Baseline: 18.53 sec w/ RUE support and right weight shift Goal status: INITIAL  2.  Pt will demonstrate TUG of </=14.69 seconds at SBA level in order to decrease risk of falls and improve functional mobility using LRAD. Baseline: 22.69 sec CGA w/ LBQC Goal status: INITIAL  3.  Pt will increase BERG balance score to at least 41/56 to  demonstrate improved static balance. Baseline: 36/56 (8/30) Goal status: INITIAL  4.  Pt will ambulate >/=250 feet with LBQC and no more than SBA w/o increased shoulder pain and improved left LE control to promote household and community access.  Baseline:  L shoulder pain 4-5/10, left knee flexion in stance  Goal status:  INITIAL  ASSESSMENT:  CLINICAL IMPRESSION: Patient continues to have elevated diastolic BP just within safe parameters for PT today.  PT addressed floor recovery as requested by pt with him  requiring min-modA for LLE placement due to tone and lack of volitional motor control.  He requires SBA for rise to standing and to turn and sit due to imbalance from hemiparetic side and lack of functional LUE use to counterbalance.  Rest of session focused on NMR for LLE.  He continues to benefit from skilled PT to address deficits as outlined in ongoing PT POC, but PT expects future sessions to be limited by ongoing BP elevation which medical team is aware of.  OBJECTIVE IMPAIRMENTS: Abnormal gait, decreased activity tolerance, decreased balance, decreased coordination, decreased endurance, decreased knowledge of condition, decreased knowledge of use of DME, difficulty walking, decreased strength, impaired perceived functional ability, impaired tone, impaired UE functional use, postural dysfunction, and pain.   ACTIVITY LIMITATIONS: carrying, lifting, bending, standing, squatting, stairs, bathing, dressing, hygiene/grooming, locomotion level, and caring for others  PARTICIPATION LIMITATIONS: meal prep, cleaning, interpersonal relationship, driving, shopping, community activity, and occupation  PERSONAL FACTORS: Behavior pattern, Fitness, Past/current experiences, Time since onset of injury/illness/exacerbation, Transportation, and 3+ comorbidities: tobacco dependence, HTN, ETOH, chronic back pain  are also affecting patient's functional outcome.   REHAB POTENTIAL: [place] PMH and  personal factors  CLINICAL DECISION MAKING: Evolving/moderate complexity  EVALUATION COMPLEXITY: Moderate  PLAN:  PT FREQUENCY: 1x/week  PT DURATION: 10 weeks (will initially plan for 8 weeks)  PLANNED INTERVENTIONS: Therapeutic exercises, Therapeutic activity, Neuromuscular re-education, Balance training, Gait training, Patient/Family education, Self Care, Joint mobilization, Stair training, Vestibular training, Visual/preceptual remediation/compensation, Orthotic/Fit training, DME instructions, Aquatic Therapy, Electrical stimulation, Taping, Manual therapy, and Re-evaluation  PLAN FOR NEXT SESSION: ASSESS R BP!  Modify HEP prn for LLE strengthening, static and dynamic balance, spasticity management (stretching).  Gait training/pre-gait - pt uses LBQC, left quad control  Sadie Haber, PT, DPT 07/13/2023, 2:00 PM

## 2023-07-13 NOTE — Therapy (Signed)
OUTPATIENT OCCUPATIONAL THERAPY NEURO TREATMENT  Patient Name: Derek [name] MRN: [id number] DOB:August 27, 1976, 47 y.o., male Today's Date: 07/13/2023  PCP: Aliene Beams, MD REFERRING PROVIDER: Ranelle Oyster, MD  END OF SESSION:  OT End of Session - 07/13/23 1553     Visit Number 4    Number of Visits 12   + evaluation   Date for OT Re-Evaluation 08/12/23    Authorization Type [employer] MEDIC[employer] HMO    Progress Note Due on Visit 10    OT Start Time 1403    OT Stop Time 1445    OT Time Calculation (min) 42 min    Equipment Utilized During Treatment --    Activity Tolerance Patient tolerated treatment well    Behavior During Therapy [facility] for tasks assessed/performed              Past Medical History:  Diagnosis Date   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/07/2020   Alcohol dependence (HCC)    Back pain    Marijuana dependence (HCC)    Tobacco dependence    Past Surgical History:  Procedure Laterality Date   BUBBLE STUDY  01/10/2020   Procedure: BUBBLE STUDY;  Surgeon: Sande Rives, MD;  Location: [facility] ENDOSCOPY;  Service: Cardiovascular;;   TEE WITHOUT CARDIOVERSION N/A 01/10/2020   Procedure: TRANSESOPHAGEAL ECHOCARDIOGRAM (TEE);  Surgeon: Sande Rives, MD;  Location: [facility] ENDOSCOPY;  Service: Cardiovascular;  Laterality: N/A;   Patient Active Problem List   Diagnosis Date Noted   Moderate major depression, single episode (HCC) 04/14/2022   Arteriosclerosis of carotid artery 02/01/2022   Essential hypertension 02/01/2022   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) 02/01/2022   History of substance abuse (HCC) 02/01/2022   Iron deficiency anemia 02/01/2022   Late effects of cerebrovascular disease 02/01/2022   Obesity 02/01/2022   Pure hypercholesterolemia 02/01/2022   Pain due to onychomycosis of toenails of both feet 06/10/2021   Neuropathic pain 04/09/2020   Pain    Spastic hemiparesis (HCC)    History  of hypertension    Dyslipidemia    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   Leukocytosis 01/12/2020   Cerebrovascular accident (CVA) due to thrombosis of right middle cerebral artery (HCC)    Chronic pain syndrome    Hypokalemia    Dysphagia, post-stroke    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/07/2020   Tobacco dependence    Marijuana dependence (HCC)    Alcohol dependence (HCC)     ONSET DATE: referral: 05/03/2023  REFERRING DIAG: Y78.295 (ICD-10-CM) - Right middle cerebral artery stroke (HCC) G81.10 (ICD-10-CM) - Spastic hemiparesis (HCC)  DX: spasticity LUE s/p botox pecs and finger flexors on 02/09/23 - scheduled again next month (August) DX: CVA with spastic left hemiparesis RX: Eval and treat, resume therapies, bp better controlledDX: CVA with spastic left hemiparesis RX: Eval and treat, resume therapies, bp better controlled THERAPY DIAG:  Muscle weakness (generalized)  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 Pt reports he typically makes sandwiches or soup at home. He would like to be able to cut food like an apple. Reports he has difficulty getting bread out of bag.   Pt accompanied by: self  PERTINENT  HISTORY: Spastic L Hemiparesis.  PMH: R MCA CVA, Alcohol Dependence (occasional), Marijuana (NA)/Tobacco ([place]) Dependence, Back Pain  PRECAUTIONS: Other: no driving  WEIGHT BEARING RESTRICTIONS: No  PAIN:  Are you having pain? Yes: NPRS scale: 5/10 Pain location: LUE - shoulder Pain description: aching  Aggravating factors: laying on it Relieving factors: Pain patch (doesn't have rx anymore) stretches, pain meds  FALLS: Has patient fallen in last 6 months?  No  LIVING ENVIRONMENT: Lives with:  roommate Lives in: House 8 steps to enter (B rails but uses R side rail going up), 1 level home Has following equipment at home: Quad cane small base, Wheelchair (manual), Shower bench, and bed side commode - over the toilet in the bathroom, urinal at night   PLOF: Independent prior to stroke 2021, on disability  PATIENT GOALS: Improve Lt arm mobility and balance  OBJECTIVE:   HAND DOMINANCE: Right  ADLs: Overall ADLs: Has aide M-Sun 2-4 hrs/day to assist with bathing, and some dressing, cooking and cleaning Transfers/ambulation related to ADLs: mod I w/ quad cane Eating: mod I eating, difficulty cutting food and occasional assist from aide Grooming: mod I brushing teeth and shaving UB Dressing: mod I for pull over shirt, assist for button up shirt LB Dressing: Aide assist getting pants over feet and assist for donning shoes and socks Toileting: mod I Bathing: max assist from aide Tub Shower transfers: min assist for leg management with tub bench Equipment: Transfer tub bench, Grab bars, and hand held shower  IADLs: Shopping: has groceries delivered [employer]: aide does Meal Prep: aide or roommate does but can get snack or heat up something in microwave after someone gets it out of the fridge etc for him Community mobility: relies on friends Medication management: independent s/p aide sorting it into his pill box Financial management: roommate does Handwriting:  denies change  MOBILITY STATUS:  walks with quad cane in house and short distances   UPPER EXTREMITY ROM:  RUE AROM WNL's  LUE: Limited active movement - sh shrug w/ slight elbow flex dominated by synergy pattern  Passive ROM Right eval Left eval  Shoulder flexion  90*  Shoulder abduction  80*  Shoulder adduction    Shoulder extension    Shoulder internal rotation    Shoulder external rotation    Elbow flexion  90%  Elbow extension  90%  Wrist flexion  90%   Wrist extension  25%  Wrist ulnar deviation    Wrist radial deviation    Wrist pronation    Wrist supination  90%  (Blank rows = not tested)  Passive finger extension full with wrist flexion. Difficulty at PIP joint extension, especially LF - at rest, PIP flexed, DIP hyperextended  HAND FUNCTION: No function LT HAND  COORDINATION: No function Lt hand  SENSATION: Significantly diminished - cannot detect location, entire L UE is dull feeling  EDEMA: minimal Lt hand  MUSCLE TONE: LUE: Moderate, Hypertonic, and Modifed Ashworth Scale 2 = More marked increase in muscle tone through most of the ROM, but affected part(s) easily moved  COGNITION: Overall cognitive status:  pt reports memory deficits  VISION: Subjective report: still reports that he need glasses for reading.  Said he was 20/30 last time he went to the eye doctor but no changes in vision from stroke  Visual history:  none  PERCEPTION: Not tested  PRAXIS: Not tested  OBSERVATIONS: Pleasant gentlemen with somewhat flat affect and significant limitations with L UE ie) flickers of movement  only noted although synergy patterns noted with yawning where L elbow flexed up to 90+ degrees.   TODAY'S TREATMENT:                                                                                                                              OT educated pt on use of rocker knife with RUE. Pt able to return demonstration as needed to improve independence and safety with cutting food. OT also explored additional options including pizza wheel cutter, hand chopper, and mini food processor.   OT educated pt on use of one handed cutting board for food prep including holding jars or bowls to help with opening/stirring respectively. Pt also shown how to use shelf-liner to help with holding items such as plates, bowls, or food on table or counter.   OT educated pt on lidded bowls with handles to transfer hot food from room to room in RUE while  using cane in this hand while reducing risk of spilling food.   OT educated pt on use of light-weight, walled plates to help with safe transfer of food in RUE from room to room but also to help with one-handed feeding.   OT educated pt on replacing twist tie on bread with chip clip to increase ease of opening and closing off bag.   PATIENT EDUCATION: Education details: AD for food prep/eating Person educated: Patient Education method: Explanation, Demonstration, Tactile cues, Verbal cues, and Handouts Education comprehension: verbalized understanding, returned demonstration, verbal cues required, tactile cues required, and needs further education  HOME EXERCISE PROGRAM: 07/01/23 - Access Code: [email or web address]  GOALS: Goals reviewed with patient? Yes  SHORT TERM GOALS: Target date:8/30//24  Independent with wear and care of resting hand splint for LUE up to daily. Baseline: 2-3x/week Goal status: IN PROGRESS  2.  Independent HEP for LUE self stretches  Baseline: None established a OT POC 3 months ago Goal status: IN PROGRESS  3.  Pt to verbalize understanding with memory compensation strategies ie) binder for info Baseline: Self reported memory issues and limited access to past HEPs, medication list etc. Goal status: IN PROGRESS   LONG TERM GOALS: Target date: 08/12/23  Pt to verbalize understanding with potential A/E needs for one handed use to increase independence with ADLS (rocker knife, shoe buttons, LH sponge, etc)  Baseline: Dependent on caregiver assistance for many aspects of ADLs Goal status: IN PROGRESS  2.  Pt to cut food I'ly with A/E Baseline: Dependent on caregiver assistance for many aspects of ADLs Goal status: INITIAL  3.  Pt to consistently perform all UB dressing except buttons Baseline: Assisted by caregiver Goal status: INITIAL  4.  Pt to perform LB dressing w/ no more than min assist with A/E prn (tie shoelaces) Baseline: Assisted by caregiver Goal  status: MET 07/01/23 - patient is able to don/remove regular shoes (no splint) with laces fastened.    ASSESSMENT:  CLINICAL IMPRESSION: Patient demonstrates good understanding of  AD for ADL and IADL completion secondary to L hemiparesis. Pt motivated to improve functional use of LUE. Given presentation, unsure the extent of rehab potential for this. Would recommend trial use of NMES/E-stim.  PERFORMANCE DEFICITS: in functional skills including ADLs, IADLs, coordination, dexterity, sensation, tone, ROM, strength, pain, flexibility, Fine motor control, Gross motor control, mobility, body mechanics, decreased knowledge of use of DME, and UE functional use, cognitive skills including memory, and psychosocial skills including coping strategies.   IMPAIRMENTS: are limiting patient from ADLs, IADLs, and rest and sleep.   CO-MORBIDITIES: may have co-morbidities  that affects occupational performance. Patient will benefit from skilled OT to address above impairments and improve overall function.  REHAB POTENTIAL: Good for established goals   PLAN:  OT FREQUENCY: 2x/week  OT DURATION: 6 weeks (plus evaluation)  PLANNED INTERVENTIONS: self care/ADL training, therapeutic exercise, therapeutic activity, neuromuscular re-education, manual therapy, passive range of motion, functional mobility training, splinting, moist heat, patient/family education, cognitive remediation/compensation, coping strategies training, and DME and/or AE instructions  RECOMMENDED OTHER SERVICES: P.T evaluating patient today also.   CONSULTED AND AGREED WITH PLAN OF CARE: Patient  PLAN FOR NEXT SESSION:  ADL check - dressing UB  Check on possible BendEase resting hand splint (need medical release)  Progress and print self PROM exercises for LUE.  Continue SciFit for UEs - check on Active Hand options provided.  Begin folder/binder for collection of info needed to monitor BP, list medications, collect AE info, combine  HEP ideas.  Delana Meyer, OT 07/13/2023, 3:57 PM

## 2023-07-15 ENCOUNTER — Encounter: Payer: Medicare HMO | Admitting: Occupational Therapy

## 2023-07-20 ENCOUNTER — Encounter: Payer: Self-pay | Admitting: Physical Therapy

## 2023-07-20 ENCOUNTER — Ambulatory Visit: Payer: Medicare HMO | Admitting: Occupational Therapy

## 2023-07-20 ENCOUNTER — Ambulatory Visit: Payer: Medicare HMO | Admitting: Physical Therapy

## 2023-07-20 VITALS — BP 155/98 | HR 55

## 2023-07-20 DIAGNOSIS — M24542 Contracture, left hand: Secondary | ICD-10-CM | POA: Diagnosis not present

## 2023-07-20 DIAGNOSIS — I69354 Hemiplegia and hemiparesis following cerebral infarction affecting left non-dominant side: Secondary | ICD-10-CM

## 2023-07-20 DIAGNOSIS — R2681 Unsteadiness on feet: Secondary | ICD-10-CM | POA: Diagnosis not present

## 2023-07-20 DIAGNOSIS — R293 Abnormal posture: Secondary | ICD-10-CM | POA: Diagnosis not present

## 2023-07-20 DIAGNOSIS — M6281 Muscle weakness (generalized): Secondary | ICD-10-CM | POA: Diagnosis not present

## 2023-07-20 DIAGNOSIS — R208 Other disturbances of skin sensation: Secondary | ICD-10-CM | POA: Diagnosis not present

## 2023-07-20 DIAGNOSIS — M79602 Pain in left arm: Secondary | ICD-10-CM | POA: Diagnosis not present

## 2023-07-20 DIAGNOSIS — R2689 Other abnormalities of gait and mobility: Secondary | ICD-10-CM | POA: Diagnosis not present

## 2023-07-20 DIAGNOSIS — G8110 Spastic hemiplegia affecting unspecified side: Secondary | ICD-10-CM | POA: Diagnosis not present

## 2023-07-20 NOTE — Patient Instructions (Signed)
Reviewed HEP and added new images  Access Code: [email or web address] URL: [email or web address] Date: 07/20/2023 Prepared by: Amada Kingfisher  Old Exercises - Seated Shoulder Flexion Towel Slide at Table Top  - 1 x daily - 10 reps - Seated Shoulder Scaption Slide at Table Top with Forearm in Neutral  - 1 x daily - 10 reps - Forearm Supination PROM  - 1 x daily - 10 reps  New Exercises - Standing Shoulder Shrugs  - 2 x daily - 5-10 reps - Seated Wrist Extension PROM  - 1 x daily - 10 reps - Hand PROM Finger Extension  - 1 x daily - 10 reps

## 2023-07-20 NOTE — Therapy (Addendum)
OUTPATIENT PHYSICAL THERAPY NEURO TREATMENT   Patient Name: Derek [name] MRN: [id number] DOB:1975-11-28, 47 y.o., male Today's Date: 07/20/2023   PCP: Aliene Beams, MD REFERRING PROVIDER: Ranelle Oyster, MD  END OF SESSION:  PT End of Session - 07/20/23 1410     Visit Number 5    Number of Visits 9   8 + eval   Date for PT Re-Evaluation 08/05/23   pushed out due to multi-D scheduling delay   Authorization Type [employer]ICARE    Progress Note Due on Visit 10    PT Start Time 1403   received from OT   PT Stop Time 1501   5 minutes not billed as pt in restroom   PT Time Calculation (min) 58 min    Equipment Utilized During Treatment Gait belt    Activity Tolerance Patient tolerated treatment well;Treatment limited secondary to medical complications (Comment);Other (comment)   HTN   Behavior During Therapy WFL for tasks assessed/performed            Past Medical History:  Diagnosis Date   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/07/2020   Alcohol dependence (HCC)    Back pain    Marijuana dependence (HCC)    Tobacco dependence    Past Surgical History:  Procedure Laterality Date   BUBBLE STUDY  01/10/2020   Procedure: BUBBLE STUDY;  Surgeon: Sande Rives, MD;  Location: [facility] ENDOSCOPY;  Service: Cardiovascular;;   TEE WITHOUT CARDIOVERSION N/A 01/10/2020   Procedure: TRANSESOPHAGEAL ECHOCARDIOGRAM (TEE);  Surgeon: Sande Rives, MD;  Location: [facility] ENDOSCOPY;  Service: Cardiovascular;  Laterality: N/A;   Patient Active Problem List   Diagnosis Date Noted   Moderate major depression, single episode (HCC) 04/14/2022   Arteriosclerosis of carotid artery 02/01/2022   Essential hypertension 02/01/2022   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) 02/01/2022   History of substance abuse (HCC) 02/01/2022   Iron deficiency anemia 02/01/2022   Late effects of cerebrovascular disease 02/01/2022   Obesity 02/01/2022   Pure  hypercholesterolemia 02/01/2022   Pain due to onychomycosis of toenails of both feet 06/10/2021   Neuropathic pain 04/09/2020   Pain    Spastic hemiparesis (HCC)    History of hypertension    Dyslipidemia    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   Leukocytosis 01/12/2020   Cerebrovascular accident (CVA) due to thrombosis of right middle cerebral artery (HCC)    Chronic pain syndrome    Hypokalemia    Dysphagia, post-stroke    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/07/2020   Tobacco dependence    Marijuana dependence (HCC)    Alcohol dependence (HCC)     ONSET DATE: 2021 (CVA)  REFERRING DIAG: I63.511 (ICD-10-CM) - Right middle cerebral artery stroke (HCC) G81.10 (ICD-10-CM) - Spastic hemiparesis (HCC)  THERAPY DIAG:  Muscle weakness (generalized)  Spastic hemiplegia of left nondominant 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 (HCC)  Other abnormalities of gait and mobility  Unsteadiness on feet  Rationale for Evaluation and Treatment: Rehabilitation  SUBJECTIVE:  SUBJECTIVE STATEMENT: Patient ambulates into gym w/ LBQC.  He denies falls or acute changes.  He reports fear of falling. Pt accompanied by: self  PERTINENT HISTORY: R MCA CVA, Alcohol Dependence, Marijuana/Tobacco Dependence, Back Pain   PAIN:  Are you having pain? No - some neck pain from prior stretches with OT.  Today's Vitals   07/20/23 1406 07/20/23 1411 07/20/23 1418  BP: (!) 139/103 (!) 140/108 (!) 155/98  Pulse: (!) 55 61 (!) 55  He denies headache, visual changes, or dizziness.  PRECAUTIONS: Fall and Other: L spastic hemi  WEIGHT BEARING RESTRICTIONS: No  FALLS: Has patient fallen in last 6 months? No  LIVING ENVIRONMENT: Lives with:  roommate Lives in: House/apartment Stairs: Yes:  External: 8 steps; bilateral but cannot reach both Has following equipment at home: Quad cane large base, Hemi walker, Wheelchair (manual), shower chair, and Grab bars  PLOF: Independent with household mobility with device, Needs assistance with ADLs, and Needs assistance with homemaking-aide comes in the mornings for 4 hours on weekdays and 2 hours on weekends  PATIENT GOALS: "to get some mobility on my left side"  "to be able to stand longer and walk easier"  OBJECTIVE:   DIAGNOSTIC FINDINGS: 01/09/20 head CT: IMPRESSION: 1. Evolving right cerebral infarct with evidence for hemorrhagic transformation at the right basal ganglia, similar in appearance and distribution as compared to previous MRI. Associated mass effect with 3 mm of right-to-left shift. No hydrocephalus or ventricular trapping. 2. No other new acute intracranial abnormality.  COGNITION: Overall cognitive status: Within functional limits for tasks assessed   SENSATION: WFL  COORDINATION: LE RAMS:  limited by lack of DF AROM on L Heel-to-shin:  can cross left to right, but cannot bring heel directly to shin   MUSCLE TONE: LLE: Hypertonic Clonus present  POSTURE: rounded shoulders, forward head, increased thoracic kyphosis, posterior pelvic tilt, left pelvic obliquity, and weight shift right  LOWER EXTREMITY MMT:    MMT Right Eval Left Eval  Hip flexion 5 2-  Hip extension    Hip abduction 5 3  Hip adduction 5 3  Hip internal rotation    Hip external rotation    Knee flexion 4 2+/5  Knee extension 5 3 w/ increased time he can achieve full extension  Ankle dorsiflexion 5 0  Ankle plantarflexion    Ankle inversion    Ankle eversion    (Blank rows = not tested)  BED MOBILITY:  Independent per patient report - he reports his bed is lower which helps  TRANSFERS: Assistive device utilized: Quad cane large base  Sit to stand: SBA Stand to sit: SBA Chair to chair: SBA  GAIT: Gait pattern: step to  pattern, decreased arm swing- Left, decreased step length- Right, decreased stance time- Left, decreased hip/knee flexion- Left, decreased ankle dorsiflexion- Left, circumduction- Left, Left hip hike, lateral lean- Right, trunk rotated posterior- Left, and poor foot clearance- Left Distance walked: various clinic distances Assistive device utilized: Quad cane large base Level of assistance: SBA Comments:  LUE held in flexion and IR and LLE lacking heel strike due to PF tone.  Gentle L knee flexion maintained in stance due to spasticity.  FUNCTIONAL TESTS:  5 times sit to stand: 18.53 seconds w/ RUE support and right lateral lean Timed up and go (TUG): 22.69 seconds w/ CGA and LBQC Berg Balance Scale: To be finished next session.  PATIENT SURVEYS:  FOTO not captured due to chronicity of CVA.  TODAY'S TREATMENT:    RUE  in sitting prior to session:                                                                                                                           Today's Vitals   07/20/23 1406 07/20/23 1411 07/20/23 1418  BP: (!) 139/103 (!) 140/108 (!) 155/98  Pulse: (!) 55 61 (!) 55  *Pt in restroom between BP 2 and 3 x5 minutes - not billed.  -SLS at counter using RUE support 8x10 seconds each LE -Wide semi-tandem 2x1 minute w/ LLE in rear -Narrowed semi-tandem 2x1 minute w/ LLE in rear, pt has LOB on step back requiring modA for recovery -Tandem at counter 2x40 seconds each LE in rear, pt is able to safely get into and out of position at no more than SBA level positioning hemiparetic leg first  -Time spent problem solving pt's barrier to doing HEP - he cannot keep papers organized or get them in and out of clear paper protector (keeping all HEPs in this).  Provided folder and paperclips and had patient practice using this to keep his papers together.  Spoke to OT regarding future HEP management with healthy UE and will have pt bring all papers to future session to provide ongoing  methods to improve compliance and independence with ongoing multidisciplinary HEPs.  -Standing in normal stance using RUE to transfer cones high<>low surfaces laterally x2 rounds SBA w/ PT making surfaces farther away to increase reach outside BOS, left weight shift, and trunk rotation  *He requires seated rest b/w balance tasks w/ reported fatigue in legs.  PATIENT EDUCATION: Education details: Monitor BP as able.  Continue HEP- focus on stretching and walking in home if nothing else.  Ongoing education regarding fall prevention and recovery. Person educated: Patient Education method: Explanation Education comprehension: verbalized understanding and needs further education  HOME EXERCISE PROGRAM: [email or web address] - added SLS and tandem at counter on 07/20/2023  GOALS: Goals reviewed with patient? Yes  SHORT TERM GOALS: Target date: 06/24/2023  Pt will be independent and compliant with strengthening, stretching, and balance focused HEP in order to maintain functional progress and improve mobility. Baseline:  Reviewed (9/4) Goal status: IN PROGRESS  2.  Pt will decrease 5xSTS to </=15.53 seconds using RUE support as needed with improved midline in order to demonstrate decreased risk for falls and improved functional bilateral LE strength and power. Baseline: 18.53 sec w/ RUE support and right weight shift; 19.28 sec w/ RUE support and right weight shift (9/4) Goal status: NOT MET  3.  Pt will demonstrate TUG of </=18.69 seconds at SBA level in order to decrease risk of falls and improve functional mobility using LRAD. Baseline: 22.69 sec CGA w/ LBQC; 23.81 sec SBA w/ LBQC (9/4) Goal status: NOT MET  LONG TERM GOALS: Target date: 07/22/2023 (Note:  due to scheduling, likely assess single goal at LTG and update date for last visit at 10 week mark)  Pt will decrease 5xSTS to </=12.53 seconds  using RUE support as needed with improved midline in order to demonstrate decreased risk for falls and  improved functional bilateral LE strength and power. Baseline: 18.53 sec w/ RUE support and right weight shift Goal status: INITIAL  2.  Pt will demonstrate TUG of </=14.69 seconds at SBA level in order to decrease risk of falls and improve functional mobility using LRAD. Baseline: 22.69 sec CGA w/ LBQC Goal status: INITIAL  3.  Pt will increase BERG balance score to at least 41/56 to demonstrate improved static balance. Baseline: 36/56 (8/30) Goal status: INITIAL  4.  Pt will ambulate >/=250 feet with LBQC and no more than SBA w/o increased shoulder pain and improved left LE control to promote household and community access.  Baseline:  L shoulder pain 4-5/10, left knee flexion in stance  Goal status:  INITIAL  ASSESSMENT:  CLINICAL IMPRESSION: Session limited by ongoing elevated BP that MD is aware of.  Ongoing attempts to improve pt compliance and access to printed HEP.  Further standing balance and weight shift work completed this session w/ pt remaining limited by chronic left hemiparesis.  He continues to benefit from skilled PT to further address chronic deficits impacting fall risk.  OBJECTIVE IMPAIRMENTS: Abnormal gait, decreased activity tolerance, decreased balance, decreased coordination, decreased endurance, decreased knowledge of condition, decreased knowledge of use of DME, difficulty walking, decreased strength, impaired perceived functional ability, impaired tone, impaired UE functional use, postural dysfunction, and pain.   ACTIVITY LIMITATIONS: carrying, lifting, bending, standing, squatting, stairs, bathing, dressing, hygiene/grooming, locomotion level, and caring for others  PARTICIPATION LIMITATIONS: meal prep, cleaning, interpersonal relationship, driving, shopping, community activity, and occupation  PERSONAL FACTORS: Behavior pattern, Fitness, Past/current experiences, Time since onset of injury/illness/exacerbation, Transportation, and 3+ comorbidities: tobacco  dependence, HTN, ETOH, chronic back pain  are also affecting patient's functional outcome.   REHAB POTENTIAL: [place] See PMH and personal factors  CLINICAL DECISION MAKING: Evolving/moderate complexity  EVALUATION COMPLEXITY: Moderate  PLAN:  PT FREQUENCY: 1x/week  PT DURATION: 10 weeks (will initially plan for 8 weeks)  PLANNED INTERVENTIONS: Therapeutic exercises, Therapeutic activity, Neuromuscular re-education, Balance training, Gait training, Patient/Family education, Self Care, Joint mobilization, Stair training, Vestibular training, Visual/preceptual remediation/compensation, Orthotic/Fit training, DME instructions, Aquatic Therapy, Electrical stimulation, Taping, Manual therapy, and Re-evaluation  PLAN FOR NEXT SESSION: ASSESS R BP!  Modify HEP prn for LLE strengthening, static and dynamic balance, spasticity management (stretching).  Gait training/pre-gait - pt uses LBQC, left quad control  Sadie Haber, PT, DPT 07/20/2023, 4:01 PM

## 2023-07-20 NOTE — Therapy (Unsigned)
OUTPATIENT OCCUPATIONAL THERAPY NEURO TREATMENT  Patient Name: Derek [name] MRN: [id number] DOB:06/10/1976, 47 y.o., male Today's Date: 07/20/2023  PCP: Aliene Beams, MD REFERRING PROVIDER: Ranelle Oyster, MD  END OF SESSION:  OT End of Session - 07/20/23 1319     Visit Number 5    Number of Visits 12   + evaluation   Date for OT Re-Evaluation 08/12/23    Authorization Type [employer]    Progress Note Due on Visit 10    OT Start Time 1320    OT Stop Time 1400    OT Time Calculation (min) 40 min    Activity Tolerance Patient tolerated treatment well    Behavior During Therapy [facility] for tasks assessed/performed              Past Medical History:  Diagnosis Date   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/07/2020   Alcohol dependence (HCC)    Back pain    Marijuana dependence (HCC)    Tobacco dependence    Past Surgical History:  Procedure Laterality Date   BUBBLE STUDY  01/10/2020   Procedure: BUBBLE STUDY;  Surgeon: Sande Rives, MD;  Location: [facility] ENDOSCOPY;  Service: Cardiovascular;;   TEE WITHOUT CARDIOVERSION N/A 01/10/2020   Procedure: TRANSESOPHAGEAL ECHOCARDIOGRAM (TEE);  Surgeon: Sande Rives, MD;  Location: [facility] ENDOSCOPY;  Service: Cardiovascular;  Laterality: N/A;   Patient Active Problem List   Diagnosis Date Noted   Moderate major depression, single episode (HCC) 04/14/2022   Arteriosclerosis of carotid artery 02/01/2022   Essential hypertension 02/01/2022   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) 02/01/2022   History of substance abuse (HCC) 02/01/2022   Iron deficiency anemia 02/01/2022   Late effects of cerebrovascular disease 02/01/2022   Obesity 02/01/2022   Pure hypercholesterolemia 02/01/2022   Pain due to onychomycosis of toenails of both feet 06/10/2021   Neuropathic pain 04/09/2020   Pain    Spastic hemiparesis (HCC)    History of hypertension    Dyslipidemia    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   Leukocytosis 01/12/2020   Cerebrovascular accident (CVA) due to thrombosis of right middle cerebral artery (HCC)    Chronic pain syndrome    Hypokalemia    Dysphagia, post-stroke    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/07/2020   Tobacco dependence    Marijuana dependence (HCC)    Alcohol dependence (HCC)     ONSET DATE: referral: 05/03/2023  REFERRING DIAG: J47.829 (ICD-10-CM) - Right middle cerebral artery stroke (HCC) G81.10 (ICD-10-CM) - Spastic hemiparesis (HCC)  DX: spasticity LUE s/p botox pecs and finger flexors on 02/09/23 - scheduled again next month (August) DX: CVA with spastic left hemiparesis RX: Eval and treat, resume therapies, bp better controlledDX: CVA with spastic left hemiparesis RX: Eval and treat, resume therapies, bp better controlled THERAPY DIAG:  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   Pt accompanied by: self  PERTINENT HISTORY: Spastic L Hemiparesis.  PMH: R MCA CVA, Alcohol Dependence (occasional),  Marijuana (NA)/Tobacco ([place]) Dependence, Back Pain  PRECAUTIONS: Other: no driving  WEIGHT BEARING RESTRICTIONS: No  PAIN:  Are you having pain? Yes: NPRS scale: 5/10 Pain location: LUE - shoulder Pain description: aching  Aggravating factors: laying on it Relieving factors: Pain patch (doesn't have rx anymore) stretches, pain meds  FALLS: Has patient fallen in last 6 months? No  LIVING ENVIRONMENT: Lives with:  roommate Lives in: House 8  steps to enter (B rails but uses R side rail going up), 1 level home Has following equipment at home: Quad cane small base, Wheelchair (manual), Shower bench, and bed side commode - over the toilet in the bathroom, urinal at night   PLOF: Independent prior to stroke 2021, on disability  PATIENT GOALS: Improve Lt arm mobility and balance  OBJECTIVE:   HAND DOMINANCE: Right  ADLs: Overall ADLs: Has aide M-Sun 2-4 hrs/day to assist with bathing, and some dressing, cooking and cleaning Transfers/ambulation related to ADLs: mod I w/ quad cane Eating: mod I eating, difficulty cutting food and occasional assist from aide Grooming: mod I brushing teeth and shaving UB Dressing: mod I for pull over shirt, assist for button up shirt LB Dressing: Aide assist getting pants over feet and assist for donning shoes and socks Toileting: mod I Bathing: max assist from aide Tub Shower transfers: min assist for leg management with tub bench Equipment: Transfer tub bench, Grab bars, and hand held shower  IADLs: Shopping: has groceries delivered [employer]: aide does Meal Prep: aide or roommate does but can get snack or heat up something in microwave after someone gets it out of the fridge etc for him Community mobility: relies on friends Medication management: independent s/p aide sorting it into his pill box Financial management: roommate does Handwriting:  denies change  MOBILITY STATUS:  walks with quad cane in house and short distances   UPPER EXTREMITY ROM:  RUE AROM WNL's  LUE: Limited active movement - sh shrug w/ slight elbow flex dominated by synergy pattern  Passive ROM Right eval Left eval  Shoulder flexion  90*  Shoulder abduction  80*  Shoulder adduction    Shoulder extension    Shoulder internal rotation    Shoulder external rotation    Elbow flexion  90%  Elbow extension  90%  Wrist flexion  90%  Wrist extension  25%  Wrist ulnar deviation    Wrist radial  deviation    Wrist pronation    Wrist supination  90%  (Blank rows = not tested)  Passive finger extension full with wrist flexion. Difficulty at PIP joint extension, especially LF - at rest, PIP flexed, DIP hyperextended  HAND FUNCTION: No function LT HAND  COORDINATION: No function Lt hand  SENSATION: Significantly diminished - cannot detect location, entire L UE is dull feeling  EDEMA: minimal Lt hand  MUSCLE TONE: LUE: Moderate, Hypertonic, and Modifed Ashworth Scale 2 = More marked increase in muscle tone through most of the ROM, but affected part(s) easily moved  COGNITION: Overall cognitive status:  pt reports memory deficits  VISION: Subjective report: still reports that he need glasses for reading.  Said he was 20/30 last time he went to the eye doctor but no changes in vision from stroke  Visual history:  none  PERCEPTION: Not tested  PRAXIS: Not tested  OBSERVATIONS: Pleasant gentlemen with somewhat flat affect and significant limitations with L UE ie) flickers of movement only noted although synergy patterns noted with yawning where L elbow flexed  up to 90+ degrees.   TODAY'S TREATMENT:                                                                                                                               Therapeutic Exercises Reviewed previous HEP exercises: - Seated Shoulder Flexion Towel Slide at Table Top   - Seated Shoulder Scaption Slide at Table Top with Forearm in Neutral  - Forearm Supination PROM   Added new exercises to HEP and provided demonstration, assistance and guidance for self performance with return demonstration sought. - Standing Shoulder Shrugs  - Pt engaged in this activity looking in a mirror to help him see shoulder elevation on L side, minimize use of trunk for motion and to encourage him to perform task in bathroom room for carryover at home. - Seated Wrist Extension PROM  - Pt encouraged to stretch wrist by pulling back in the  palm of his hand and not just pulling back his fingers.  Encouraged to relax shoulder and fingers during stretch initially and eventually add finger stretches with wrist extension as long as he is not pulling back at the MCP joints. - Hand PROM Finger Extension  - Pt engaged in stretching fingers atop table to help flatten then but also encouraged to keep his wrist from flexing as much as possible.    OT provided extensive assistance with stretching L UE (wrist/hand) and educated in positioning for elbow extension, shoulder abduction (pillow under arm) and for use of RUE to effectively stretch L hand/arm.   Orthotic Assessment Pt has hemiplegic L UE with minimal active movements (slight in shoulder and synergy patterns with yawning etc) and has flexion contractures developing in L wrist /hand ie) unable to extend wrist and digits simultaneously, even getting to neutral is limited.  Pt previously had a thermoplastic splint but without padding or ease of adjustability his fingers hyperextend at the DIP joints and he can't wear it at night as it wakes him if he hits himself with it.  Pt to benefit from prefab hand splint to help prevent further joint deformity, improve pain, and improve overall functional use of BUE. Recommend prefab vs fab due to chronicity of joint contractures, adjustability, comfort, and ease of cleaning.  PATIENT EDUCATION: Education details: HEP - self ROM  Person educated: Patient Education method: Explanation, Demonstration, Tactile cues, Verbal cues, and Handouts Education comprehension: verbalized understanding, returned demonstration, verbal cues required, tactile cues required, and needs further education  HOME EXERCISE PROGRAM: 07/01/23 - Shoulder table top slides & forearm supination Access Code: [email or web address] 07/20/23 - shoulder shrugs, PROM L UE wrist and digits - same access code  GOALS: Goals reviewed with patient? Yes  SHORT TERM GOALS: Target  date:8/30//24  Independent with wear and care of resting hand splint for LUE up to daily. Baseline: 2-3x/week Goal status: IN PROGRESS  2.  Independent HEP for LUE self stretches  Baseline: None established a OT POC 3 months  ago Goal status: IN PROGRESS  3.  Pt to verbalize understanding with memory compensation strategies ie) binder for info Baseline: Self reported memory issues and limited access to past HEPs, medication list etc. Goal status: IN PROGRESS   LONG TERM GOALS: Target date: 08/12/23  Pt to verbalize understanding with potential A/E needs for one handed use to increase independence with ADLS (rocker knife, shoe buttons, LH sponge, etc)  Baseline: Dependent on caregiver assistance for many aspects of ADLs Goal status: IN PROGRESS  2.  Pt to cut food I'ly with A/E Baseline: Dependent on caregiver assistance for many aspects of ADLs Goal status: IN PROGRESS  3.  Pt to consistently perform all UB dressing except buttons Baseline: Assisted by caregiver Goal status: IN PROGRESS  4.  Pt to perform LB dressing w/ no more than min assist with A/E prn (tie shoelaces) Baseline: Assisted by caregiver Goal status: MET 07/01/23 - patient is able to don/remove regular shoes (no splint) with laces fastened.    ASSESSMENT:  CLINICAL IMPRESSION: Patient is a 47 y.o. male who was seen today for occupational therapy treatment for L hemiplegia due to CVA. Patient currently presents with limited active use of LUE, loss of full ROM at wrist and digits (especially long finger) and does not have a comfortable splint for contracture management at this time.  Pt motivated to improve functional use of LUE but given extent of hemiplegia, it is uncertain its rehab potential and definitely in need of a resting hand splint to prevent further loss of ROM and promote max extension of digits for functional stabilization use.  In addition. OT staff recommend trial use of NMES/E-stim.  PERFORMANCE  DEFICITS: in functional skills including ADLs, IADLs, coordination, dexterity, sensation, tone, ROM, strength, pain, flexibility, Fine motor control, Gross motor control, mobility, body mechanics, decreased knowledge of use of DME, and UE functional use, cognitive skills including memory, and psychosocial skills including coping strategies.   IMPAIRMENTS: are limiting patient from ADLs, IADLs, and rest and sleep.   CO-MORBIDITIES: may have co-morbidities  that affects occupational performance. Patient will benefit from skilled OT to address above impairments and improve overall function.  REHAB POTENTIAL: Good for established goals   PLAN:  OT FREQUENCY: 2x/week  OT DURATION: 6 weeks (plus evaluation)  PLANNED INTERVENTIONS: self care/ADL training, therapeutic exercise, therapeutic activity, neuromuscular re-education, manual therapy, passive range of motion, functional mobility training, splinting, moist heat, patient/family education, cognitive remediation/compensation, coping strategies training, and DME and/or AE instructions  RECOMMENDED OTHER SERVICES: P.T evaluating patient today also.   CONSULTED AND AGREED WITH PLAN OF CARE: Patient  PLAN FOR NEXT SESSION:  ADL check - dressing UB Check on BendEase resting hand splint Progress and print self PROM exercises for LUE. Continue SciFit for UEs - check on Active Hand options provided. Begin folder/binder for collection of info needed to monitor BP, list medications, collect AE info, combine HEP ideas and assist with Memory deficits  Victorino Sparrow, OT 07/20/2023, 5:37 PM

## 2023-07-22 ENCOUNTER — Encounter: Payer: Medicare HMO | Admitting: Occupational Therapy

## 2023-07-25 ENCOUNTER — Ambulatory Visit: Payer: Medicare HMO | Admitting: Occupational Therapy

## 2023-07-25 DIAGNOSIS — M24542 Contracture, left hand: Secondary | ICD-10-CM | POA: Diagnosis not present

## 2023-07-25 DIAGNOSIS — I69354 Hemiplegia and hemiparesis following cerebral infarction affecting left non-dominant side: Secondary | ICD-10-CM | POA: Diagnosis not present

## 2023-07-25 DIAGNOSIS — M6281 Muscle weakness (generalized): Secondary | ICD-10-CM

## 2023-07-25 DIAGNOSIS — R2689 Other abnormalities of gait and mobility: Secondary | ICD-10-CM | POA: Diagnosis not present

## 2023-07-25 DIAGNOSIS — R293 Abnormal posture: Secondary | ICD-10-CM | POA: Diagnosis not present

## 2023-07-25 DIAGNOSIS — M79602 Pain in left arm: Secondary | ICD-10-CM

## 2023-07-25 DIAGNOSIS — R208 Other disturbances of skin sensation: Secondary | ICD-10-CM | POA: Diagnosis not present

## 2023-07-25 DIAGNOSIS — R2681 Unsteadiness on feet: Secondary | ICD-10-CM | POA: Diagnosis not present

## 2023-07-25 DIAGNOSIS — G8110 Spastic hemiplegia affecting unspecified side: Secondary | ICD-10-CM | POA: Diagnosis not present

## 2023-07-25 NOTE — Patient Instructions (Signed)
Access Code: [email or web address] URL: [email or web address] Date: 07/25/2023 Prepared by: Amada Kingfisher  Exercises  REVIEWED - Seated Shoulder Flexion Towel Slide at Table Top  - 1 x daily - 10 reps - Seated Shoulder Scaption Slide at Table Top with Forearm in Neutral  - 1 x daily - 10 reps - Forearm Supination PROM  - 1 x daily - 10 reps - Standing Shoulder Shrugs  - 2 x daily - 5-10 reps - Seated Wrist Extension PROM  - 1 x daily - 10 reps - Hand PROM Finger Extension  - 1 x daily - 10 reps  ADDED - Seated Elbow PROM Blocked Extension  - 1 x daily - 10 reps - Supine Shoulder Flexion AAROM with Hands Clasped  - 2 x daily - 10 reps

## 2023-07-25 NOTE — Therapy (Unsigned)
OUTPATIENT OCCUPATIONAL THERAPY NEURO TREATMENT  Patient Name: Derek [name] MRN: [id number] DOB:[birth year], 47 y.o., male 89 Date: 07/25/2023  PCP: Aliene Beams, MD REFERRING PROVIDER: Ranelle Oyster, MD  END OF SESSION:  OT End of Session - 07/25/23 1451     Visit Number 6    Number of Visits 12   + evaluation   Date for OT Re-Evaluation 08/12/23    Authorization Type [employer]    Progress Note Due on Visit 10    OT Start Time 1452    OT Stop Time 1530    OT Time Calculation (min) 38 min    Activity Tolerance Patient tolerated treatment well    Behavior During Therapy [facility] for tasks assessed/performed              Past Medical History:  Diagnosis Date   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/07/2020   Alcohol dependence (HCC)    Back pain    Marijuana dependence (HCC)    Tobacco dependence    Past Surgical History:  Procedure Laterality Date   BUBBLE STUDY  01/10/2020   Procedure: BUBBLE STUDY;  Surgeon: Sande Rives, MD;  Location: [facility] ENDOSCOPY;  Service: Cardiovascular;;   TEE WITHOUT CARDIOVERSION N/A 01/10/2020   Procedure: TRANSESOPHAGEAL ECHOCARDIOGRAM (TEE);  Surgeon: Sande Rives, MD;  Location: [facility] ENDOSCOPY;  Service: Cardiovascular;  Laterality: N/A;   Patient Active Problem List   Diagnosis Date Noted   Moderate major depression, single episode (HCC) 04/14/2022   Arteriosclerosis of carotid artery 02/01/2022   Essential hypertension 02/01/2022   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) 02/01/2022   History of substance abuse (HCC) 02/01/2022   Iron deficiency anemia 02/01/2022   Late effects of cerebrovascular disease 02/01/2022   Obesity 02/01/2022   Pure hypercholesterolemia 02/01/2022   Pain due to onychomycosis of toenails of both feet 06/10/2021   Neuropathic pain 04/09/2020   Pain    Spastic hemiparesis (HCC)    History of hypertension    Dyslipidemia    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   Leukocytosis 01/12/2020   Cerebrovascular accident (CVA) due to thrombosis of right middle cerebral artery (HCC)    Chronic pain syndrome    Hypokalemia    Dysphagia, post-stroke    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/07/2020   Tobacco dependence    Marijuana dependence (HCC)    Alcohol dependence (HCC)     ONSET DATE: referral: 05/03/2023  REFERRING DIAG: G64.403 (ICD-10-CM) - Right middle cerebral artery stroke (HCC) G81.10 (ICD-10-CM) - Spastic hemiparesis (HCC)  DX: spasticity LUE s/p botox pecs and finger flexors on 02/09/23 - scheduled again next month (August) DX: CVA with spastic left hemiparesis RX: Eval and treat, resume therapies, bp better controlledDX: CVA with spastic left hemiparesis RX: Eval and treat, resume therapies, bp better controlled THERAPY DIAG:  Pain in left arm  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   Pt accompanied by: self  PERTINENT HISTORY: Spastic L Hemiparesis.  PMH: R MCA CVA, Alcohol Dependence (occasional),  Marijuana (NA)/Tobacco ([place]) Dependence, Back Pain  PRECAUTIONS: Other: no driving  WEIGHT BEARING RESTRICTIONS: No  PAIN:  Are you having pain? Yes: NPRS scale: 7/10 Pain location: LUE - shoulder L hip Pain description: aching  Aggravating factors: laying on it Relieving factors: Pain patch (doesn't have rx anymore) stretches, pain meds  FALLS: Has patient fallen in last 6 months? No  LIVING ENVIRONMENT: Lives with:  roommate Lives in: House  8 steps to enter (B rails but uses R side rail going up), 1 level home Has following equipment at home: Quad cane small base, Wheelchair (manual), Shower bench, and bed side commode - over the toilet in the bathroom, urinal at night   PLOF: Independent prior to stroke 2021, on disability  PATIENT GOALS: Improve Lt arm mobility and balance  OBJECTIVE:   HAND DOMINANCE: Right  ADLs: Overall ADLs: Has aide M-Sun 2-4 hrs/day to assist with bathing, and some dressing, cooking and cleaning Transfers/ambulation related to ADLs: mod I w/ quad cane Eating: mod I eating, difficulty cutting food and occasional assist from aide Grooming: mod I brushing teeth and shaving UB Dressing: mod I for pull over shirt, assist for button up shirt LB Dressing: Aide assist getting pants over feet and assist for donning shoes and socks Toileting: mod I Bathing: max assist from aide Tub Shower transfers: min assist for leg management with tub bench Equipment: Transfer tub bench, Grab bars, and hand held shower  IADLs: Shopping: has groceries delivered [employer]: aide does Meal Prep: aide or roommate does but can get snack or heat up something in microwave after someone gets it out of the fridge etc for him Community mobility: relies on friends Medication management: independent s/p aide sorting it into his pill box Financial management: roommate does Handwriting:  denies change  MOBILITY STATUS:  walks with quad cane in house and short distances   UPPER EXTREMITY ROM:  RUE AROM WNL's  LUE: Limited active movement - sh shrug w/ slight elbow flex dominated by synergy pattern  Passive ROM Right eval Left eval  Shoulder flexion  90*  Shoulder abduction  80*  Shoulder adduction    Shoulder extension    Shoulder internal rotation    Shoulder external rotation    Elbow flexion  90%  Elbow extension  90%  Wrist flexion  90%  Wrist extension  25%  Wrist ulnar deviation    Wrist radial  deviation    Wrist pronation    Wrist supination  90%  (Blank rows = not tested)  Passive finger extension full with wrist flexion. Difficulty at PIP joint extension, especially LF - at rest, PIP flexed, DIP hyperextended  HAND FUNCTION: No function LT HAND  COORDINATION: No function Lt hand  SENSATION: Significantly diminished - cannot detect location, entire L UE is dull feeling  EDEMA: minimal Lt hand  MUSCLE TONE: LUE: Moderate, Hypertonic, and Modifed Ashworth Scale 2 = More marked increase in muscle tone through most of the ROM, but affected part(s) easily moved  COGNITION: Overall cognitive status:  pt reports memory deficits  VISION: Subjective report: still reports that he need glasses for reading.  Said he was 20/30 last time he went to the eye doctor but no changes in vision from stroke  Visual history:  none  PERCEPTION: Not tested  PRAXIS: Not tested  OBSERVATIONS: Pleasant gentlemen with somewhat flat affect and significant limitations with L UE ie) flickers of movement only noted although synergy patterns noted with yawning where L  elbow flexed up to 90+ degrees.   TODAY'S TREATMENT:                                                                                                                               OT educated patient *** on sleep positioning as noted in patient instructions to reduce stress to upper extremity nerves, which could be attributing to reported paresthesias and pain in affected extremity. Patient verbalized understanding. Handout provided.   Therapeutic Exercises Reviewed previous HEP exercises: - Seated Shoulder Flexion Towel Slide at Table Top   - Seated Shoulder Scaption Slide at Table Top with Forearm in Neutral  - Forearm Supination PROM   Added new exercises to HEP and provided demonstration, assistance and guidance for self performance with return demonstration sought. - Standing Shoulder Shrugs  - Pt engaged in this activity  looking in a mirror to help him see shoulder elevation on L side, minimize use of trunk for motion and to encourage him to perform task in bathroom room for carryover at home. - Seated Wrist Extension PROM  - Pt encouraged to stretch wrist by pulling back in the palm of his hand and not just pulling back his fingers.  Encouraged to relax shoulder and fingers during stretch initially and eventually add finger stretches with wrist extension as long as he is not pulling back at the MCP joints. - Hand PROM Finger Extension  - Pt engaged in stretching fingers atop table to help flatten then but also encouraged to keep his wrist from flexing as much as possible.    OT provided extensive assistance with stretching L UE (wrist/hand) and educated in positioning for elbow extension, shoulder abduction (pillow under arm) and for use of RUE to effectively stretch L hand/arm.   Orthotic Assessment Pt has hemiplegic L UE with minimal active movements (slight in shoulder and synergy patterns with yawning etc) and has flexion contractures developing in L wrist /hand ie) unable to extend wrist and digits simultaneously, even getting to neutral is limited.  Pt previously had a thermoplastic splint but without padding or ease of adjustability his fingers hyperextend at the DIP joints and he can't wear it at night as it wakes him if he hits himself with it.  Pt to benefit from prefab hand splint to help prevent further joint deformity, improve pain, and improve overall functional use of BUE. Recommend prefab vs fab due to chronicity of joint contractures, adjustability, comfort, and ease of cleaning.  PATIENT EDUCATION: Education details: HEP - self ROM  Person educated: Patient Education method: Explanation, Demonstration, Tactile cues, Verbal cues, and Handouts Education comprehension: verbalized understanding, returned demonstration, verbal cues required, tactile cues required, and needs further education  HOME  EXERCISE PROGRAM: 07/01/23 - Shoulder table top slides & forearm supination Access Code: [email or web address] 07/20/23 - shoulder shrugs, PROM L UE wrist and digits - same access code  GOALS: Goals reviewed with patient? Yes  SHORT TERM GOALS: Target  date:8/30//24  Independent with wear and care of resting hand splint for LUE up to daily. Baseline: 2-3x/week Goal status: IN PROGRESS  2.  Independent HEP for LUE self stretches  Baseline: None established a OT POC 3 months ago Goal status: IN PROGRESS  3.  Pt to verbalize understanding with memory compensation strategies ie) binder for info Baseline: Self reported memory issues and limited access to past HEPs, medication list etc. Goal status: IN PROGRESS   LONG TERM GOALS: Target date: 08/12/23  Pt to verbalize understanding with potential A/E needs for one handed use to increase independence with ADLS (rocker knife, shoe buttons, LH sponge, etc)  Baseline: Dependent on caregiver assistance for many aspects of ADLs Goal status: IN PROGRESS  2.  Pt to cut food I'ly with A/E Baseline: Dependent on caregiver assistance for many aspects of ADLs Goal status: IN PROGRESS  3.  Pt to consistently perform all UB dressing except buttons Baseline: Assisted by caregiver Goal status: IN PROGRESS  4.  Pt to perform LB dressing w/ no more than min assist with A/E prn (tie shoelaces) Baseline: Assisted by caregiver Goal status: MET 07/01/23 - patient is able to don/remove regular shoes (no splint) with laces fastened.    ASSESSMENT:  CLINICAL IMPRESSION: Patient is a 47 y.o. male who was seen today for occupational therapy treatment for L hemiplegia due to CVA. Patient currently presents with limited active use of LUE, loss of full ROM at wrist and digits (especially long finger) and does not have a comfortable splint for contracture management at this time.  Pt motivated to improve functional use of LUE but given extent of hemiplegia, it is uncertain  its rehab potential and definitely in need of a resting hand splint to prevent further loss of ROM and promote max extension of digits for functional stabilization use.  In addition. OT staff recommend trial use of NMES/E-stim.  PERFORMANCE DEFICITS: in functional skills including ADLs, IADLs, coordination, dexterity, sensation, tone, ROM, strength, pain, flexibility, Fine motor control, Gross motor control, mobility, body mechanics, decreased knowledge of use of DME, and UE functional use, cognitive skills including memory, and psychosocial skills including coping strategies.   IMPAIRMENTS: are limiting patient from ADLs, IADLs, and rest and sleep.   CO-MORBIDITIES: may have co-morbidities  that affects occupational performance. Patient will benefit from skilled OT to address above impairments and improve overall function.  REHAB POTENTIAL: Good for established goals   PLAN:  OT FREQUENCY: 2x/week  OT DURATION: 6 weeks (plus evaluation)  PLANNED INTERVENTIONS: self care/ADL training, therapeutic exercise, therapeutic activity, neuromuscular re-education, manual therapy, passive range of motion, functional mobility training, splinting, moist heat, patient/family education, cognitive remediation/compensation, coping strategies training, and DME and/or AE instructions  RECOMMENDED OTHER SERVICES: P.T evaluating patient today also.   CONSULTED AND AGREED WITH PLAN OF CARE: Patient  PLAN FOR NEXT SESSION:  ADL check - dressing UB Check on BendEase resting hand splint Progress and print self PROM exercises for LUE. Continue SciFit for UEs - check on Active Hand options provided. Begin folder/binder for collection of info needed to monitor BP, list medications, collect AE info, combine HEP ideas and assist with Memory deficits  Victorino Sparrow, OT 07/25/2023, 5:22 PM

## 2023-07-26 DIAGNOSIS — Z125 Encounter for screening for malignant neoplasm of prostate: Secondary | ICD-10-CM | POA: Diagnosis not present

## 2023-07-26 DIAGNOSIS — Z23 Encounter for immunization: Secondary | ICD-10-CM | POA: Diagnosis not present

## 2023-07-26 DIAGNOSIS — I69054 Hemiplegia and hemiparesis following nontraumatic subarachnoid hemorrhage affecting left non-dominant side: Secondary | ICD-10-CM | POA: Diagnosis not present

## 2023-07-26 DIAGNOSIS — Z0000 Encounter for general adult medical examination without abnormal findings: Secondary | ICD-10-CM | POA: Diagnosis not present

## 2023-07-26 DIAGNOSIS — I10 Essential (primary) hypertension: Secondary | ICD-10-CM | POA: Diagnosis not present

## 2023-07-26 DIAGNOSIS — R7303 Prediabetes: Secondary | ICD-10-CM | POA: Diagnosis not present

## 2023-07-26 DIAGNOSIS — Z79899 Other long term (current) drug therapy: Secondary | ICD-10-CM | POA: Diagnosis not present

## 2023-07-26 DIAGNOSIS — I6930 Unspecified sequelae of cerebral infarction: Secondary | ICD-10-CM | POA: Diagnosis not present

## 2023-07-26 DIAGNOSIS — F320 Major depressive disorder, single episode, mild: Secondary | ICD-10-CM | POA: Diagnosis not present

## 2023-07-26 DIAGNOSIS — E7800 Pure hypercholesterolemia, unspecified: Secondary | ICD-10-CM | POA: Diagnosis not present

## 2023-07-27 ENCOUNTER — Encounter: Payer: Self-pay | Admitting: Physical Therapy

## 2023-07-27 ENCOUNTER — Ambulatory Visit: Payer: Medicare HMO | Admitting: Occupational Therapy

## 2023-07-27 ENCOUNTER — Ambulatory Visit: Payer: Medicare HMO | Admitting: Physical Therapy

## 2023-07-27 VITALS — BP 146/93 | HR 76

## 2023-07-27 DIAGNOSIS — R2681 Unsteadiness on feet: Secondary | ICD-10-CM | POA: Diagnosis not present

## 2023-07-27 DIAGNOSIS — M6281 Muscle weakness (generalized): Secondary | ICD-10-CM | POA: Diagnosis not present

## 2023-07-27 DIAGNOSIS — G8110 Spastic hemiplegia affecting unspecified side: Secondary | ICD-10-CM | POA: Diagnosis not present

## 2023-07-27 DIAGNOSIS — R2689 Other abnormalities of gait and mobility: Secondary | ICD-10-CM | POA: Diagnosis not present

## 2023-07-27 DIAGNOSIS — M24542 Contracture, left hand: Secondary | ICD-10-CM | POA: Diagnosis not present

## 2023-07-27 DIAGNOSIS — M79602 Pain in left arm: Secondary | ICD-10-CM | POA: Diagnosis not present

## 2023-07-27 DIAGNOSIS — R293 Abnormal posture: Secondary | ICD-10-CM | POA: Diagnosis not present

## 2023-07-27 DIAGNOSIS — R208 Other disturbances of skin sensation: Secondary | ICD-10-CM | POA: Diagnosis not present

## 2023-07-27 DIAGNOSIS — I69354 Hemiplegia and hemiparesis following cerebral infarction affecting left non-dominant side: Secondary | ICD-10-CM | POA: Diagnosis not present

## 2023-07-27 NOTE — Therapy (Signed)
OUTPATIENT PHYSICAL THERAPY NEURO TREATMENT   Patient Name: Derek [name] MRN: [id number] DOB:[birth year], 47 y.o., male Today's Date: 07/27/2023   PCP: Aliene Beams, MD REFERRING PROVIDER: Ranelle Oyster, MD  END OF SESSION:  PT End of Session - 07/27/23 1337     Visit Number 6    Number of Visits 9   8 + eval   Date for PT Re-Evaluation 08/05/23   pushed out due to multi-D scheduling delay   Authorization Type [employer]ICARE    Progress Note Due on Visit 10    PT Start Time 1318    PT Stop Time 1400    PT Time Calculation (min) 42 min    Equipment Utilized During Treatment Gait belt    Activity Tolerance Patient tolerated treatment well;Other (comment)   pt ambulating in from retrieving cell phone from car, feeling overheated, needing to use restroom.   Behavior During Therapy [facility] for tasks assessed/performed            Past Medical History:  Diagnosis Date   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/07/2020   Alcohol dependence (HCC)    Back pain    Marijuana dependence (HCC)    Tobacco dependence    Past Surgical History:  Procedure Laterality Date   BUBBLE STUDY  01/10/2020   Procedure: BUBBLE STUDY;  Surgeon: Sande Rives, MD;  Location: [facility] ENDOSCOPY;  Service: Cardiovascular;;   TEE WITHOUT CARDIOVERSION N/A 01/10/2020   Procedure: TRANSESOPHAGEAL ECHOCARDIOGRAM (TEE);  Surgeon: Sande Rives, MD;  Location: [facility] ENDOSCOPY;  Service: Cardiovascular;  Laterality: N/A;   Patient Active Problem List   Diagnosis Date Noted   Moderate major depression, single episode (HCC) 04/14/2022   Arteriosclerosis of carotid artery 02/01/2022   Essential hypertension 02/01/2022   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) 02/01/2022   History of substance abuse (HCC) 02/01/2022   Iron deficiency anemia 02/01/2022   Late effects of cerebrovascular disease 02/01/2022   Obesity 02/01/2022   Pure hypercholesterolemia  02/01/2022   Pain due to onychomycosis of toenails of both feet 06/10/2021   Neuropathic pain 04/09/2020   Pain    Spastic hemiparesis (HCC)    History of hypertension    Dyslipidemia    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   Leukocytosis 01/12/2020   Cerebrovascular accident (CVA) due to thrombosis of right middle cerebral artery (HCC)    Chronic pain syndrome    Hypokalemia    Dysphagia, post-stroke    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/07/2020   Tobacco dependence    Marijuana dependence (HCC)    Alcohol dependence (HCC)     ONSET DATE: 2021 (CVA)  REFERRING DIAG: I63.511 (ICD-10-CM) - Right middle cerebral artery stroke (HCC) G81.10 (ICD-10-CM) - Spastic hemiparesis (HCC)  THERAPY DIAG:  Muscle weakness (generalized)  Other abnormalities of gait and mobility  Unsteadiness on feet  Abnormal posture  Rationale for Evaluation and Treatment: Rehabilitation  SUBJECTIVE:  SUBJECTIVE STATEMENT: Patient ambulates into clinic w/ PT from lobby as he was retrieving his cellphone from his car.  He reports being on a new BP medication and is unsure whether or not to take this with his previous BP medicine.  He denies falls and endorses ongoing fear of falling. Pt accompanied by: self  PERTINENT HISTORY: R MCA CVA, Alcohol Dependence, Marijuana/Tobacco Dependence, Back Pain   PAIN:  Are you having pain? Yes: NPRS scale: 5-6/10 Pain location: left shoulder and left hip Pain description: achy Aggravating factors: leaning on it, varies as to which positions irritate it Relieving factors: pain patch  Today's Vitals   07/27/23 1334  BP: (!) 146/93  Pulse: 76    PRECAUTIONS: Fall and Other: L spastic hemi  WEIGHT BEARING RESTRICTIONS: No  FALLS: Has patient fallen in  last 6 months? No  LIVING ENVIRONMENT: Lives with:  roommate Lives in: House/apartment Stairs: Yes: External: 8 steps; bilateral but cannot reach both Has following equipment at home: Quad cane large base, Hemi walker, Wheelchair (manual), shower chair, and Grab bars  PLOF: Independent with household mobility with device, Needs assistance with ADLs, and Needs assistance with homemaking-aide comes in the mornings for 4 hours on weekdays and 2 hours on weekends  PATIENT GOALS: "to get some mobility on my left side"  "to be able to stand longer and walk easier"  OBJECTIVE:   DIAGNOSTIC FINDINGS: 01/09/20 head CT: IMPRESSION: 1. Evolving right cerebral infarct with evidence for hemorrhagic transformation at the right basal ganglia, similar in appearance and distribution as compared to previous MRI. Associated mass effect with 3 mm of right-to-left shift. No hydrocephalus or ventricular trapping. 2. No other new acute intracranial abnormality.  COGNITION: Overall cognitive status: Within functional limits for tasks assessed   SENSATION: WFL  COORDINATION: LE RAMS:  limited by lack of DF AROM on L Heel-to-shin:  can cross left to right, but cannot bring heel directly to shin   MUSCLE TONE: LLE: Hypertonic Clonus present  POSTURE: rounded shoulders, forward head, increased thoracic kyphosis, posterior pelvic tilt, left pelvic obliquity, and weight shift right  LOWER EXTREMITY MMT:    MMT Right Eval Left Eval  Hip flexion 5 2-  Hip extension    Hip abduction 5 3  Hip adduction 5 3  Hip internal rotation    Hip external rotation    Knee flexion 4 2+/5  Knee extension 5 3 w/ increased time he can achieve full extension  Ankle dorsiflexion 5 0  Ankle plantarflexion    Ankle inversion    Ankle eversion    (Blank rows = not tested)  BED MOBILITY:  Independent per patient report - he reports his bed is lower which helps  TRANSFERS: Assistive device utilized: Quad cane  large base  Sit to stand: SBA Stand to sit: SBA Chair to chair: SBA  GAIT: Gait pattern: step to pattern, decreased arm swing- Left, decreased step length- Right, decreased stance time- Left, decreased hip/knee flexion- Left, decreased ankle dorsiflexion- Left, circumduction- Left, Left hip hike, lateral lean- Right, trunk rotated posterior- Left, and poor foot clearance- Left Distance walked: various clinic distances Assistive device utilized: Quad cane large base Level of assistance: SBA Comments:  LUE held in flexion and IR and LLE lacking heel strike due to PF tone.  Gentle L knee flexion maintained in stance due to spasticity.  FUNCTIONAL TESTS:  5 times sit to stand: 18.53 seconds w/ RUE support and right lateral lean Timed up and go (TUG):  22.69 seconds w/ CGA and LBQC Berg Balance Scale: To be finished next session.  PATIENT SURVEYS:  FOTO not captured due to chronicity of CVA.  TODAY'S TREATMENT:    RUE in sitting prior to session:                                                                                                                           Today's Vitals   07/27/23 1334  BP: (!) 146/93  Pulse: 76   -PT ambulates into clinic from parking lot due to patient appearing diaphoretic and reporting feeling very warm.  He requests to go to restroom to wipe forehead and use restroom before session.  PT stands outside door for safety holding door for pt to enter/exit safely. -Assessed BP as above once ambulated to mat.  Pt requests cold cloth due to feeling warm still so PT provided.  Allowed pt a minute to rest and provided water as requested.  When assessing pain pt inquires about addressing shoulder pain, PT communicates this to OT due to limited PT session time and desire to address LE pain/ROM and balance.  -PT provides PROM and stretching to LLE - ER/IR, Adduction/abduction, hamstrings, glut, and PF -PT assists pt back to sitting supporting left shoulder during  transition and cuing pt to perform roll vs pulling on cane handle.  PATIENT EDUCATION: Education details: Monitor BP as able - provided Dr. Malena Peer assistant's number for him to call and inquire about medication combination as mentioned in subjective.  Continue HEP- focus on stretching and walking in home if nothing else, continued to encourage pt to bring exercises for PT to modify layout in folder and improve his ability to follow his program.  Ongoing education regarding fall prevention and recovery. Person educated: Patient Education method: Explanation Education comprehension: verbalized understanding and needs further education  HOME EXERCISE PROGRAM: [id number] - added SLS and tandem at counter on 07/20/2023  GOALS: Goals reviewed with patient? Yes  SHORT TERM GOALS: Target date: 06/24/2023  Pt will be independent and compliant with strengthening, stretching, and balance focused HEP in order to maintain functional progress and improve mobility. Baseline:  Reviewed (9/4) Goal status: IN PROGRESS  2.  Pt will decrease 5xSTS to </=15.53 seconds using RUE support as needed with improved midline in order to demonstrate decreased risk for falls and improved functional bilateral LE strength and power. Baseline: 18.53 sec w/ RUE support and right weight shift; 19.28 sec w/ RUE support and right weight shift (9/4) Goal status: NOT MET  3.  Pt will demonstrate TUG of </=18.69 seconds at SBA level in order to decrease risk of falls and improve functional mobility using LRAD. Baseline: 22.69 sec CGA w/ LBQC; 23.81 sec SBA w/ LBQC (9/4) Goal status: NOT MET  LONG TERM GOALS: Target date: 07/22/2023 (Note:  due to scheduling, likely assess single goal at LTG and update date for last visit at 10 week mark)  Pt will decrease 5xSTS to </=12.53  seconds using RUE support as needed with improved midline in order to demonstrate decreased risk for falls and improved functional bilateral LE strength and  power. Baseline: 18.53 sec w/ RUE support and right weight shift Goal status: INITIAL  2.  Pt will demonstrate TUG of </=14.69 seconds at SBA level in order to decrease risk of falls and improve functional mobility using LRAD. Baseline: 22.69 sec CGA w/ LBQC Goal status: INITIAL  3.  Pt will increase BERG balance score to at least 41/56 to demonstrate improved static balance. Baseline: 36/56 (8/30) Goal status: INITIAL  4.  Pt will ambulate >/=250 feet with LBQC and no more than SBA w/o increased shoulder pain and improved left LE control to promote household and community access.  Baseline:  L shoulder pain 4-5/10, left knee flexion in stance  Goal status:  INITIAL  ASSESSMENT:  CLINICAL IMPRESSION: Pt presents with mildly improved BP today.  He endorses being on a new medication for BP.  Session limited by some need for medical monitoring due to patient feeling hot from walking back and forth to car at onset of session.  PT was able to stretch the LLE and he would benefit from soft tissue work to LUE next session if not addressed successfully by OT today.  OBJECTIVE IMPAIRMENTS: Abnormal gait, decreased activity tolerance, decreased balance, decreased coordination, decreased endurance, decreased knowledge of condition, decreased knowledge of use of DME, difficulty walking, decreased strength, impaired perceived functional ability, impaired tone, impaired UE functional use, postural dysfunction, and pain.   ACTIVITY LIMITATIONS: carrying, lifting, bending, standing, squatting, stairs, bathing, dressing, hygiene/grooming, locomotion level, and caring for others  PARTICIPATION LIMITATIONS: meal prep, cleaning, interpersonal relationship, driving, shopping, community activity, and occupation  PERSONAL FACTORS: Behavior pattern, Fitness, Past/current experiences, Time since onset of injury/illness/exacerbation, Transportation, and 3+ comorbidities: tobacco dependence, HTN, ETOH, chronic back  pain  are also affecting patient's functional outcome.   REHAB POTENTIAL: [place] PMH and personal factors  CLINICAL DECISION MAKING: Evolving/moderate complexity  EVALUATION COMPLEXITY: Moderate  PLAN:  PT FREQUENCY: 1x/week  PT DURATION: 10 weeks (will initially plan for 8 weeks)  PLANNED INTERVENTIONS: Therapeutic exercises, Therapeutic activity, Neuromuscular re-education, Balance training, Gait training, Patient/Family education, Self Care, Joint mobilization, Stair training, Vestibular training, Visual/preceptual remediation/compensation, Orthotic/Fit training, DME instructions, Aquatic Therapy, Electrical stimulation, Taping, Manual therapy, and Re-evaluation  PLAN FOR NEXT SESSION: ASSESS R BP!  Modify HEP prn for LLE strengthening, static and dynamic balance, spasticity management (stretching).  Gait training/pre-gait - pt uses LBQC, left quad control,  ADDRESS LEFT TRAP TIGHTNESS - IASTM?  TAPING?  Sadie Haber, PT, DPT 07/27/2023, 2:06 PM

## 2023-07-27 NOTE — Patient Instructions (Signed)
Access Code: [email or web address] URL: [email or web address] Date: 07/27/2023 Prepared by: Amada Kingfisher  Exercises - Seated Shoulder Flexion Towel Slide at Table Top  - 1 x daily - 10 reps - Seated Shoulder Scaption Slide at Table Top with Forearm in Neutral  - 1 x daily - 10 reps - Forearm Supination PROM  - 1 x daily - 10 reps - Standing Shoulder Shrugs  - 2 x daily - 5-10 reps - Seated Wrist Extension PROM  - 1 x daily - 10 reps - Hand PROM Finger Extension  - 1 x daily - 10 reps - Seated Elbow PROM Blocked Extension  - 1 x daily - 10 reps - Supine Shoulder Flexion AAROM with Hands Clasped  - 2 x daily - 10 reps  NEW EXERCISES - Seated Elbow Flexion Shoulder Internal Rotation AAROM at Table with Towel  - 1 x daily - 10 reps - Towel Roll Squeeze  - 1 x daily - 10 reps

## 2023-07-27 NOTE — Therapy (Unsigned)
OUTPATIENT OCCUPATIONAL THERAPY NEURO TREATMENT  Patient Name: SAMIE SEVER MRN: [id number] DOB:Aug 13, 1976, 47 y.o., male 70 Date: 07/27/2023  PCP: Aliene Beams, MD REFERRING PROVIDER: Ranelle Oyster, MD  END OF SESSION:  OT End of Session - 07/27/23 1356     Visit Number 7    Number of Visits 12   + evaluation   Date for OT Re-Evaluation 08/12/23    Authorization Type [employer]IC[employer] HMO    Progress Note Due on Visit 10    OT Start Time 1400    OT Stop Time 1445    OT Time Calculation (min) 45 min    Equipment Utilized During Treatment Hot pack    Activity Tolerance Patient tolerated treatment well    Behavior During Therapy [facility] for tasks assessed/performed              Past Medical History:  Diagnosis Date   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/07/2020   Alcohol dependence (HCC)    Back pain    Marijuana dependence (HCC)    Tobacco dependence    Past Surgical History:  Procedure Laterality Date   BUBBLE STUDY  01/10/2020   Procedure: BUBBLE STUDY;  Surgeon: Sande Rives, MD;  Location: [facility] ENDOSCOPY;  Service: Cardiovascular;;   TEE WITHOUT CARDIOVERSION N/A 01/10/2020   Procedure: TRANSESOPHAGEAL ECHOCARDIOGRAM (TEE);  Surgeon: Sande Rives, MD;  Location: [facility] ENDOSCOPY;  Service: Cardiovascular;  Laterality: N/A;   Patient Active Problem List   Diagnosis Date Noted   Moderate major depression, single episode (HCC) 04/14/2022   Arteriosclerosis of carotid artery 02/01/2022   Essential hypertension 02/01/2022   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) 02/01/2022   History of substance abuse (HCC) 02/01/2022   Iron deficiency anemia 02/01/2022   Late effects of cerebrovascular disease 02/01/2022   Obesity 02/01/2022   Pure hypercholesterolemia 02/01/2022   Pain due to onychomycosis of toenails of both feet 06/10/2021   Neuropathic pain 04/09/2020   Pain    Spastic hemiparesis (HCC)     History of hypertension    Dyslipidemia    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   Leukocytosis 01/12/2020   Cerebrovascular accident (CVA) due to thrombosis of right middle cerebral artery (HCC)    Chronic pain syndrome    Hypokalemia    Dysphagia, post-stroke    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/07/2020   Tobacco dependence    Marijuana dependence (HCC)    Alcohol dependence (HCC)     ONSET DATE: referral: 05/03/2023  REFERRING DIAG: Y86.578 (ICD-10-CM) - Right middle cerebral artery stroke (HCC) G81.10 (ICD-10-CM) - Spastic hemiparesis (HCC)  DX: spasticity LUE s/p botox pecs and finger flexors on 02/09/23 - scheduled again next month (August) DX: CVA with spastic left hemiparesis RX: Eval and treat, resume therapies, bp better controlledDX: CVA with spastic left hemiparesis RX: Eval and treat, resume therapies, bp better controlled THERAPY DIAG:  Pain in left arm  Muscle weakness (generalized)  Contracture, left hand  Rationale for Evaluation and Treatment: Rehabilitation  SUBJECTIVE:   SUBJECTIVE STATEMENT:  Pt reports that he tossed and turned last night and his L arm and neck were hurting today.   Pt accompanied by: self  PERTINENT HISTORY: Spastic L Hemiparesis.  PMH: R MCA CVA, Alcohol Dependence (occasional), Marijuana (NA)/Tobacco ([place]) Dependence, Back Pain  PRECAUTIONS: Other: no driving  WEIGHT BEARING RESTRICTIONS: No  PAIN:  Are you having pain? Yes: NPRS scale: 5-6 prior to  heat/10 Pain location: LUE - shoulder L hip Pain description: aching  Aggravating factors: laying on it Relieving factors: Pain patch (doesn't have rx anymore) stretches, pain meds  FALLS: Has patient fallen in last 6 months? No  LIVING ENVIRONMENT: Lives with:  roommate Lives in: House 8 steps to enter (B rails but uses R side rail going up), 1 level home Has following equipment at home: Quad cane small base, Wheelchair (manual), Shower bench, and  bed side commode - over the toilet in the bathroom, urinal at night   PLOF: Independent prior to stroke 2021, on disability  PATIENT GOALS: Improve Lt arm mobility and balance  OBJECTIVE:   HAND DOMINANCE: Right  ADLs: Overall ADLs: Has aide M-Sun 2-4 hrs/day to assist with bathing, and some dressing, cooking and cleaning Transfers/ambulation related to ADLs: mod I w/ quad cane Eating: mod I eating, difficulty cutting food and occasional assist from aide Grooming: mod I brushing teeth and shaving UB Dressing: mod I for pull over shirt, assist for button up shirt LB Dressing: Aide assist getting pants over feet and assist for donning shoes and socks Toileting: mod I Bathing: max assist from aide Tub Shower transfers: min assist for leg management with tub bench Equipment: Transfer tub bench, Grab bars, and hand held shower  IADLs: Shopping: has groceries delivered [employer]: aide does Meal Prep: aide or roommate does but can get snack or heat up something in microwave after someone gets it out of the fridge etc for him Community mobility: relies on friends Medication management: independent s/p aide sorting it into his pill box Financial management: roommate does Handwriting:  denies change  MOBILITY STATUS:  walks with quad cane in house and short distances   UPPER EXTREMITY ROM:  RUE AROM WNL's  LUE: Limited active movement - sh shrug w/ slight elbow flex dominated by synergy pattern  Passive ROM Right eval Left eval  Shoulder flexion  90*  Shoulder abduction  80*  Shoulder adduction    Shoulder extension    Shoulder internal rotation    Shoulder external rotation    Elbow flexion  90%  Elbow extension  90%  Wrist flexion  90%  Wrist extension  25%  Wrist ulnar deviation    Wrist radial deviation    Wrist pronation    Wrist supination  90%  (Blank rows = not tested)  Passive finger extension full with wrist flexion. Difficulty at PIP joint  extension, especially LF - at rest, PIP flexed, DIP hyperextended  HAND FUNCTION: No function LT HAND  COORDINATION: No function Lt hand  SENSATION: Significantly diminished - cannot detect location, entire L UE is dull feeling  EDEMA: minimal Lt hand  MUSCLE TONE: LUE: Moderate, Hypertonic, and Modifed Ashworth Scale 2 = More marked increase in muscle tone through most of the ROM, but affected part(s) easily moved  COGNITION: Overall cognitive status:  pt reports memory deficits  VISION: Subjective report: still reports that he need glasses for reading.  Said he was 20/30 last time he went to the eye doctor but no changes in vision from stroke  Visual history:  none  PERCEPTION: Not tested  PRAXIS: Not tested  OBSERVATIONS: Pleasant gentlemen with somewhat flat affect and significant limitations with L UE ie) flickers of movement only noted although synergy patterns noted with yawning where L elbow flexed up to 90+ degrees.   TODAY'S TREATMENT:  Neuromuscular Reed Reviewed previous HEP exercises from previous sessions incluidng: - Seated Shoulder Flexion Towel Slide at Table Top   - Seated Shoulder Scaption Slide at Table Top with Forearm in Neutral  - Forearm Supination PROM  - Standing Shoulder Shrugs   - Seated Wrist Extension PROM   - Hand PROM Finger Extension     Added new exercises to HEP and provided demonstration, assistance and guidance for self performance with return demonstration sought. - Seated Elbow PROM Blocked Extension  - pillow placed under his L elbow to provide shoulder approximation of L humerus (to decreases subluxation) prior to working on extension of elbow - Supine Shoulder Flexion AAROM with Hands Clasped  - pt works on this motion in sitting but is encouraged to work on it in the bed to help increase comfort, ease and  ROM with stability of shoulder blade in bed.  - Seated Elbow Flexion Shoulder Internal Rotation AAROM at Table with Towel  - 1 x daily - 10 reps - Towel Roll Squeeze  - 1 x daily - 10 reps  Manual Techniques OT provided extensive assistance with stretching L UE (wrist/hand) and educated in positioning for effective self stretching without decreases wrist flexion, DIP hyperextension and effective self-stretching of L hand/arm.   PATIENT EDUCATION: Education details: HEP - self ROM  Person educated: Patient Education method: Explanation, Demonstration, Tactile cues, Verbal cues, and Handouts Education comprehension: verbalized understanding, returned demonstration, verbal cues required, tactile cues required, and needs further education  HOME EXERCISE PROGRAM: 07/01/23 - Shoulder table top slides & forearm supination Access Code: [email or web address] 07/20/23 - shoulder shrugs, PROM L UE wrist and digits - same access code 07/25/23 - elbow stretches and supine shoulder flexion - same access code  GOALS: Goals reviewed with patient? Yes  SHORT TERM GOALS: Target date:8/30//24  Independent with wear and care of resting hand splint for LUE up to daily. Baseline: 2-3x/week Goal status: IN PROGRESS  2.  Independent HEP for LUE self stretches  Baseline: None established a OT POC 3 months ago Goal status: IN PROGRESS  3.  Pt to verbalize understanding with memory compensation strategies ie) binder for info Baseline: Self reported memory issues and limited access to past HEPs, medication list etc. Goal status: IN PROGRESS   LONG TERM GOALS: Target date: 08/12/23  Pt to verbalize understanding with potential A/E needs for one handed use to increase independence with ADLS (rocker knife, shoe buttons, LH sponge, etc)  Baseline: Dependent on caregiver assistance for many aspects of ADLs Goal status: IN PROGRESS  2.  Pt to cut food I'ly with A/E Baseline: Dependent on caregiver assistance for many  aspects of ADLs Goal status: IN PROGRESS  3.  Pt to consistently perform all UB dressing except buttons Baseline: Assisted by caregiver Goal status: IN PROGRESS  4.  Pt to perform LB dressing w/ no more than min assist with A/E prn (tie shoelaces) Baseline: Assisted by caregiver Goal status: MET 07/01/23 - patient is able to don/remove regular shoes (no splint) with laces fastened.    ASSESSMENT:  CLINICAL IMPRESSION: Patient is a 47 y.o. male who was seen today for occupational therapy treatment for L hemiplegia due to CVA. Patient currently presents with limited active use of LUE, loss of full ROM at wrist and digits (especially long finger) and is awaiting more comfortable splint for contracture management at this time with potential delivery for next appt this week.  Pt motivated to improve functional use of LUE but given  extent of hemiplegia, it is still uncertain its rehab potential and needs extensive carryover of HEP ideas. OT staff recommend trial use of NMES/E-stim.  PERFORMANCE DEFICITS: in functional skills including ADLs, IADLs, coordination, dexterity, sensation, tone, ROM, strength, pain, flexibility, Fine motor control, Gross motor control, mobility, body mechanics, decreased knowledge of use of DME, and UE functional use, cognitive skills including memory, and psychosocial skills including coping strategies.   IMPAIRMENTS: are limiting patient from ADLs, IADLs, and rest and sleep.   CO-MORBIDITIES: may have co-morbidities  that affects occupational performance. Patient will benefit from skilled OT to address above impairments and improve overall function.  REHAB POTENTIAL: Good for established goals   PLAN:  OT FREQUENCY: 2x/week  OT DURATION: 6 weeks (plus evaluation)  PLANNED INTERVENTIONS: self care/ADL training, therapeutic exercise, therapeutic activity, neuromuscular re-education, manual therapy, passive range of motion, functional mobility training, splinting,  moist heat, patient/family education, cognitive remediation/compensation, coping strategies training, and DME and/or AE instructions  RECOMMENDED OTHER SERVICES: P.T evaluating patient today also.   CONSULTED AND AGREED WITH PLAN OF CARE: Patient  PLAN FOR NEXT SESSION:  ADL check - dressing UB FIT BendEase resting hand splint Progress and print self PROM exercises for LUE. Continue SciFit for UEs - check on Active Hand options provided. Begin folder/binder for collection of info needed to monitor BP, list medications, collect AE info, combine HEP ideas and assist with Memory deficits  Victorino Sparrow, OT 07/27/2023, 4:44 PM

## 2023-07-29 ENCOUNTER — Encounter: Payer: Medicare HMO | Admitting: Occupational Therapy

## 2023-08-01 ENCOUNTER — Ambulatory Visit: Payer: Medicare HMO | Admitting: Occupational Therapy

## 2023-08-01 DIAGNOSIS — R2689 Other abnormalities of gait and mobility: Secondary | ICD-10-CM | POA: Diagnosis not present

## 2023-08-01 DIAGNOSIS — R293 Abnormal posture: Secondary | ICD-10-CM | POA: Diagnosis not present

## 2023-08-01 DIAGNOSIS — M24542 Contracture, left hand: Secondary | ICD-10-CM | POA: Diagnosis not present

## 2023-08-01 DIAGNOSIS — M79602 Pain in left arm: Secondary | ICD-10-CM | POA: Diagnosis not present

## 2023-08-01 DIAGNOSIS — I69354 Hemiplegia and hemiparesis following cerebral infarction affecting left non-dominant side: Secondary | ICD-10-CM | POA: Diagnosis not present

## 2023-08-01 DIAGNOSIS — R208 Other disturbances of skin sensation: Secondary | ICD-10-CM | POA: Diagnosis not present

## 2023-08-01 DIAGNOSIS — R2681 Unsteadiness on feet: Secondary | ICD-10-CM | POA: Diagnosis not present

## 2023-08-01 DIAGNOSIS — G8110 Spastic hemiplegia affecting unspecified side: Secondary | ICD-10-CM | POA: Diagnosis not present

## 2023-08-01 DIAGNOSIS — M6281 Muscle weakness (generalized): Secondary | ICD-10-CM | POA: Diagnosis not present

## 2023-08-01 NOTE — Therapy (Signed)
OUTPATIENT OCCUPATIONAL THERAPY NEURO TREATMENT  Patient Name: Derek [name] MRN: [id number] DOB:February 19, 1976, 47 y.o., male Today's Date: 08/01/2023  PCP: Aliene Beams, MD REFERRING PROVIDER: Ranelle Oyster, MD  END OF SESSION:  OT End of Session - 08/01/23 1347     Visit Number 8    Number of Visits 12   + evaluation   Date for OT Re-Evaluation 08/12/23    Authorization Type [employer]IC[employer] HMO    Progress Note Due on Visit 10    OT Start Time 1316    OT Stop Time 1400    OT Time Calculation (min) 44 min    Equipment Utilized During Treatment Hot pack    Activity Tolerance Patient tolerated treatment well    Behavior During Therapy [facility] for tasks assessed/performed              Past Medical History:  Diagnosis Date   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/07/2020   Alcohol dependence (HCC)    Back pain    Marijuana dependence (HCC)    Tobacco dependence    Past Surgical History:  Procedure Laterality Date   BUBBLE STUDY  01/10/2020   Procedure: BUBBLE STUDY;  Surgeon: Sande Rives, MD;  Location: [facility] ENDOSCOPY;  Service: Cardiovascular;;   TEE WITHOUT CARDIOVERSION N/A 01/10/2020   Procedure: TRANSESOPHAGEAL ECHOCARDIOGRAM (TEE);  Surgeon: Sande Rives, MD;  Location: [facility] ENDOSCOPY;  Service: Cardiovascular;  Laterality: N/A;   Patient Active Problem List   Diagnosis Date Noted   Moderate major depression, single episode (HCC) 04/14/2022   Arteriosclerosis of carotid artery 02/01/2022   Essential hypertension 02/01/2022   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) 02/01/2022   History of substance abuse (HCC) 02/01/2022   Iron deficiency anemia 02/01/2022   Late effects of cerebrovascular disease 02/01/2022   Obesity 02/01/2022   Pure hypercholesterolemia 02/01/2022   Pain due to onychomycosis of toenails of both feet 06/10/2021   Neuropathic pain 04/09/2020   Pain    Spastic hemiparesis (HCC)     History of hypertension    Dyslipidemia    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   Leukocytosis 01/12/2020   Cerebrovascular accident (CVA) due to thrombosis of right middle cerebral artery (HCC)    Chronic pain syndrome    Hypokalemia    Dysphagia, post-stroke    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/07/2020   Tobacco dependence    Marijuana dependence (HCC)    Alcohol dependence (HCC)     ONSET DATE: referral: 05/03/2023  REFERRING DIAG: W09.811 (ICD-10-CM) - Right middle cerebral artery stroke (HCC) G81.10 (ICD-10-CM) - Spastic hemiparesis (HCC)  DX: spasticity LUE s/p botox pecs and finger flexors on 02/09/23 - scheduled again next month (August) DX: CVA with spastic left hemiparesis RX: Eval and treat, resume therapies, bp better controlledDX: CVA with spastic left hemiparesis RX: Eval and treat, resume therapies, bp better controlled THERAPY DIAG:  Pain in left arm  Contracture, left hand  Spastic hemiplegia of left nondominant 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 [facility])  Rationale for Evaluation and Treatment: Rehabilitation  SUBJECTIVE:   SUBJECTIVE STATEMENT:  Pt reports that he laid funny again and his L arm and neck were hurting again today.   Pt accompanied by: self + orthtist (Scott Elder)  PERTINENT HISTORY: Spastic L Hemiparesis.  PMH: R MCA CVA, Alcohol Dependence (occasional), Marijuana (NA)/Tobacco ([place]) Dependence, Back Pain  PRECAUTIONS: Other: no driving  WEIGHT BEARING RESTRICTIONS: No  PAIN:  Are you having pain? Yes: NPRS scale: 6-7 prior to heat/10 Pain location: LUE - shoulder L hip Pain description: aching  Aggravating factors: laying on it Relieving factors: Pain patch (doesn't have rx anymore) stretches, pain meds  FALLS: Has patient fallen in last 6 months? No  LIVING ENVIRONMENT: Lives with:  roommate Lives in: House 8 steps to enter (B rails but uses R side rail going up), 1 level home Has following  equipment at home: Quad cane small base, Wheelchair (manual), Shower bench, and bed side commode - over the toilet in the bathroom, urinal at night   PLOF: Independent prior to stroke 2021, on disability  PATIENT GOALS: Improve Lt arm mobility and balance  OBJECTIVE:   HAND DOMINANCE: Right  ADLs: Overall ADLs: Has aide M-Sun 2-4 hrs/day to assist with bathing, and some dressing, cooking and cleaning Transfers/ambulation related to ADLs: mod I w/ quad cane Eating: mod I eating, difficulty cutting food and occasional assist from aide Grooming: mod I brushing teeth and shaving UB Dressing: mod I for pull over shirt, assist for button up shirt LB Dressing: Aide assist getting pants over feet and assist for donning shoes and socks Toileting: mod I Bathing: max assist from aide Tub Shower transfers: min assist for leg management with tub bench Equipment: Transfer tub bench, Grab bars, and hand held shower  IADLs: Shopping: has groceries delivered [employer]: aide does Meal Prep: aide or roommate does but can get snack or heat up something in microwave after someone gets it out of the fridge etc for him Community mobility: relies on friends Medication management: independent s/p aide sorting it into his pill box Financial management: roommate does Handwriting:  denies change  MOBILITY STATUS:  walks with quad cane in house and short distances   UPPER EXTREMITY ROM:  RUE AROM WNL's  LUE: Limited active movement - sh shrug w/ slight elbow flex dominated by synergy pattern  Passive ROM Right eval Left eval  Shoulder flexion  90*  Shoulder abduction  80*  Shoulder adduction    Shoulder extension    Shoulder internal rotation    Shoulder external rotation    Elbow flexion  90%  Elbow extension  90%  Wrist flexion  90%  Wrist extension  25%  Wrist ulnar deviation    Wrist radial deviation    Wrist pronation    Wrist supination  90%  (Blank rows = not  tested)  Passive finger extension full with wrist flexion. Difficulty at PIP joint extension, especially LF - at rest, PIP flexed, DIP hyperextended  HAND FUNCTION: No function LT HAND  COORDINATION: No function Lt hand  SENSATION: Significantly diminished - cannot detect location, entire L UE is dull feeling  EDEMA: minimal Lt hand  MUSCLE TONE: LUE: Moderate, Hypertonic, and Modifed Ashworth Scale 2 = More marked increase in muscle tone through most of the ROM, but affected part(s) easily moved  COGNITION: Overall cognitive status:  pt reports memory deficits  VISION: Subjective report: still reports that he need glasses for reading.  Said he was 20/30 last time he went to the eye doctor but no changes in vision from stroke  Visual history:  none  PERCEPTION: Not tested  PRAXIS: Not tested  OBSERVATIONS: Pleasant gentlemen with somewhat flat affect and significant limitations with L UE ie) flickers of movement only noted although synergy patterns noted with yawning where L elbow flexed up to 90+ degrees.   TODAY'S TREATMENT:  Orthotic Management  Pt to benefit from prefab hand splints to help prevent further joint deformity, improve pain, and improve overall functional use of BUE and L BendEase splint delivered and modified today for max comfort, fit and stretch of L digits/wrist without hyperextension of DIP joints. Recommend prefab vs fab due to chronicity of joint contractures, adjustability, comfort, and ease of cleaning with finger pan modified and instruction provided re: cleaning.  OTR also provided extra padding across PIP joints to maximize stretch.    OT continued to provide assistance with stretching L UE wrist and digits, in anticipation of resting hand splint application  He is educated in positioning for effective self stretching with  decreased wrist flexion, DIP hyperextension and effective self-stretching of L hand/arm.   PATIENT EDUCATION: Education details: splint application  Person educat/ed: Patient Education method: Explanation, Demonstration, Tactile cues, and Verbal cues Education comprehension: verbalized understanding, returned demonstration, verbal cues required, tactile cues required, and needs further education  HOME EXERCISE PROGRAM: 07/01/23 - Shoulder table top slides & forearm supination Access Code: [email or web address] 07/20/23 - shoulder shrugs, PROM L UE wrist and digits - same access code 07/25/23 - elbow stretches and supine shoulder flexion - same access code 07/27/23 - elbow flexion/sh int rotation and towel squeeze - same access code  GOALS: Goals reviewed with patient? Yes  SHORT TERM GOALS: Target date:8/30//24  Independent with wear and care of resting hand splint for LUE up to daily. Baseline: 2-3x/week Goal status: IN PROGRESS 08/01/23 - RMI BendEase splint received  2.  Independent HEP for LUE self stretches  Baseline: None established a OT POC 3 months ago Goal status: IN PROGRESS  3.  Pt to verbalize understanding with memory compensation strategies ie) binder for info Baseline: Self reported memory issues and limited access to past HEPs, medication list etc. Goal status: IN PROGRESS   LONG TERM GOALS: Target date: 08/12/23  Pt to verbalize understanding with potential A/E needs for one handed use to increase independence with ADLS (rocker knife, shoe buttons, LH sponge, etc)  Baseline: Dependent on caregiver assistance for many aspects of ADLs Goal status: IN PROGRESS  2.  Pt to cut food I'ly with A/E Baseline: Dependent on caregiver assistance for many aspects of ADLs Goal status: IN PROGRESS  3.  Pt to consistently perform all UB dressing except buttons Baseline: Assisted by caregiver Goal status: IN PROGRESS  4.  Pt to perform LB dressing w/ no more than min assist with A/E prn  (tie shoelaces) Baseline: Assisted by caregiver Goal status: MET 07/01/23 - patient is able to don/remove regular shoes (no splint) with laces fastened.    ASSESSMENT:  CLINICAL IMPRESSION: Patient is a 47 y.o. male who was seen today for occupational therapy treatment for L hemiplegia due to CVA. Patient currently presents with limited active use of LUE and prefab BendEase splint arrived today with appropriate modifications to maximize comfort and stretch of digits (PIP jts) and wrist.  He continues to have some more active movement in L arm for elbow flexion to pick up the hand/splint in standing.  Pt motivated to improve functional use of LUE and needs extensive carryover of HEP ideas for further progression. OT staff recommend trial use of NMES/E-stim.  PERFORMANCE DEFICITS: in functional skills including ADLs, IADLs, coordination, dexterity, sensation, tone, ROM, strength, pain, flexibility, Fine motor control, Gross motor control, mobility, body mechanics, decreased knowledge of use of DME, and UE functional use, cognitive skills including memory, and psychosocial skills including coping strategies.  IMPAIRMENTS: are limiting patient from ADLs, IADLs, and rest and sleep.   CO-MORBIDITIES: may have co-morbidities  that affects occupational performance. Patient will benefit from skilled OT to address above impairments and improve overall function.  REHAB POTENTIAL: Good for established goals   PLAN:  OT FREQUENCY: 2x/week  OT DURATION: 6 weeks (plus evaluation)  PLANNED INTERVENTIONS: self care/ADL training, therapeutic exercise, therapeutic activity, neuromuscular re-education, manual therapy, passive range of motion, functional mobility training, splinting, moist heat, patient/family education, cognitive remediation/compensation, coping strategies training, and DME and/or AE instructions  RECOMMENDED OTHER SERVICES: P.T evaluating patient today also.   CONSULTED AND AGREED WITH  PLAN OF CARE: Patient  PLAN FOR NEXT SESSION:   NMES LUE ADL check - dressing UB Check fit of new BendEase resting hand splint Progress and print self PROM exercises for LUE. Continue SciFit for UEs - check on Active Hand options provided. Progress folder/binder for collection of info needed to monitor BP, list medications, collect AE info, combine HEP ideas and assist with Memory deficits  Victorino Sparrow, OT 08/01/2023, 5:43 PM

## 2023-08-03 ENCOUNTER — Ambulatory Visit: Payer: Medicare HMO | Admitting: Occupational Therapy

## 2023-08-03 ENCOUNTER — Ambulatory Visit: Payer: Medicare HMO | Attending: Physical Medicine & Rehabilitation | Admitting: Physical Therapy

## 2023-08-03 ENCOUNTER — Encounter: Payer: Self-pay | Admitting: Physical Therapy

## 2023-08-03 VITALS — BP 142/83 | HR 67

## 2023-08-03 DIAGNOSIS — I69354 Hemiplegia and hemiparesis following cerebral infarction affecting left non-dominant side: Secondary | ICD-10-CM | POA: Insufficient documentation

## 2023-08-03 DIAGNOSIS — R293 Abnormal posture: Secondary | ICD-10-CM | POA: Diagnosis not present

## 2023-08-03 DIAGNOSIS — M6281 Muscle weakness (generalized): Secondary | ICD-10-CM | POA: Insufficient documentation

## 2023-08-03 DIAGNOSIS — R2681 Unsteadiness on feet: Secondary | ICD-10-CM | POA: Diagnosis not present

## 2023-08-03 DIAGNOSIS — M24542 Contracture, left hand: Secondary | ICD-10-CM | POA: Insufficient documentation

## 2023-08-03 DIAGNOSIS — M79602 Pain in left arm: Secondary | ICD-10-CM

## 2023-08-03 DIAGNOSIS — G8110 Spastic hemiplegia affecting unspecified side: Secondary | ICD-10-CM | POA: Insufficient documentation

## 2023-08-03 DIAGNOSIS — R2689 Other abnormalities of gait and mobility: Secondary | ICD-10-CM | POA: Diagnosis not present

## 2023-08-03 DIAGNOSIS — R278 Other lack of coordination: Secondary | ICD-10-CM | POA: Insufficient documentation

## 2023-08-03 NOTE — Therapy (Unsigned)
OUTPATIENT OCCUPATIONAL THERAPY NEURO TREATMENT  Patient Name: Derek [name] MRN: [id number] DOB:June 06, 1976, 47 y.o., male Today's Date: 08/03/2023  PCP: Aliene Beams, MD REFERRING PROVIDER: Ranelle Oyster, MD  END OF SESSION:  OT End of Session - 08/03/23 1402     Visit Number 9    Number of Visits 12   + evaluation   Date for OT Re-Evaluation 08/12/23    Authorization Type [employer]    Progress Note Due on Visit 10    OT Start Time 1402    OT Stop Time 1445    OT Time Calculation (min) 43 min    Equipment Utilized During Treatment Personal hand splint    Activity Tolerance Patient tolerated treatment well    Behavior During Therapy [facility] for tasks assessed/performed              Past Medical History:  Diagnosis Date   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/07/2020   Alcohol dependence (HCC)    Back pain    Marijuana dependence (HCC)    Tobacco dependence    Past Surgical History:  Procedure Laterality Date   BUBBLE STUDY  01/10/2020   Procedure: BUBBLE STUDY;  Surgeon: Sande Rives, MD;  Location: [facility] ENDOSCOPY;  Service: Cardiovascular;;   TEE WITHOUT CARDIOVERSION N/A 01/10/2020   Procedure: TRANSESOPHAGEAL ECHOCARDIOGRAM (TEE);  Surgeon: Sande Rives, MD;  Location: [facility] ENDOSCOPY;  Service: Cardiovascular;  Laterality: N/A;   Patient Active Problem List   Diagnosis Date Noted   Moderate major depression, single episode (HCC) 04/14/2022   Arteriosclerosis of carotid artery 02/01/2022   Essential hypertension 02/01/2022   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) 02/01/2022   History of substance abuse (HCC) 02/01/2022   Iron deficiency anemia 02/01/2022   Late effects of cerebrovascular disease 02/01/2022   Obesity 02/01/2022   Pure hypercholesterolemia 02/01/2022   Pain due to onychomycosis of toenails of both feet 06/10/2021   Neuropathic pain 04/09/2020   Pain    Spastic hemiparesis  (HCC)    History of hypertension    Dyslipidemia    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   Leukocytosis 01/12/2020   Cerebrovascular accident (CVA) due to thrombosis of right middle cerebral artery (HCC)    Chronic pain syndrome    Hypokalemia    Dysphagia, post-stroke    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/07/2020   Tobacco dependence    Marijuana dependence (HCC)    Alcohol dependence (HCC)     ONSET DATE: referral: 05/03/2023  REFERRING DIAG: J18.841 (ICD-10-CM) - Right middle cerebral artery stroke (HCC) G81.10 (ICD-10-CM) - Spastic hemiparesis (HCC)  DX: spasticity LUE s/p botox pecs and finger flexors on 02/09/23 - scheduled again next month (August) DX: CVA with spastic left hemiparesis RX: Eval and treat, resume therapies, bp better controlledDX: CVA with spastic left hemiparesis RX: Eval and treat, resume therapies, bp better controlled THERAPY DIAG:  Contracture, left hand  Spastic hemiplegia of left nondominant 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 (HCC)  Pain in left arm  Rationale for Evaluation and Treatment: Rehabilitation  SUBJECTIVE:   SUBJECTIVE STATEMENT:  Pt reports his L arm and neck better today. He asked to have his splint straightened out some more.  Pt accompanied by: self   PERTINENT HISTORY: Spastic L Hemiparesis.  PMH: R MCA CVA, Alcohol Dependence (occasional), Marijuana (NA)/Tobacco ([place]) Dependence, Back Pain  PRECAUTIONS: Other: no driving  WEIGHT BEARING RESTRICTIONS: No  PAIN:  Are you having pain? Yes: NPRS scale: 3/10 Pain location: LUE - shoulder L  Pain description: aching  Aggravating factors: laying on it Relieving factors: Pain patch (doesn't have rx anymore) stretches, pain meds  FALLS: Has patient fallen in last 6 months? No  LIVING ENVIRONMENT: Lives with:  roommate Lives in: House 8 steps to enter (B rails but uses R side rail going up), 1 level home Has following equipment at home:  Quad cane small base, Wheelchair (manual), Shower bench, and bed side commode - over the toilet in the bathroom, urinal at night   PLOF: Independent prior to stroke 2021, on disability  PATIENT GOALS: Improve Lt arm mobility and balance  OBJECTIVE:   HAND DOMINANCE: Right  ADLs: Overall ADLs: Has aide M-Sun 2-4 hrs/day to assist with bathing, and some dressing, cooking and cleaning Transfers/ambulation related to ADLs: mod I w/ quad cane Eating: mod I eating, difficulty cutting food and occasional assist from aide Grooming: mod I brushing teeth and shaving UB Dressing: mod I for pull over shirt, assist for button up shirt LB Dressing: Aide assist getting pants over feet and assist for donning shoes and socks Toileting: mod I Bathing: max assist from aide Tub Shower transfers: min assist for leg management with tub bench Equipment: Transfer tub bench, Grab bars, and hand held shower  IADLs: Shopping: has groceries delivered [employer]: aide does Meal Prep: aide or roommate does but can get snack or heat up something in microwave after someone gets it out of the fridge etc for him Community mobility: relies on friends Medication management: independent s/p aide sorting it into his pill box Financial management: roommate does Handwriting:  denies change  MOBILITY STATUS:  walks with quad cane in house and short distances   UPPER EXTREMITY ROM:  RUE AROM WNL's  LUE: Limited active movement - sh shrug w/ slight elbow flex dominated by synergy pattern  Passive ROM Right eval Left eval  Shoulder flexion  90*  Shoulder abduction  80*  Shoulder adduction    Shoulder extension    Shoulder internal rotation    Shoulder external rotation    Elbow flexion  90%  Elbow extension  90%  Wrist flexion  90%  Wrist extension  25%  Wrist ulnar deviation    Wrist radial deviation    Wrist pronation    Wrist supination  90%  (Blank rows = not tested)  Passive finger  extension full with wrist flexion. Difficulty at PIP joint extension, especially LF - at rest, PIP flexed, DIP hyperextended  HAND FUNCTION: No function LT HAND  COORDINATION: No function Lt hand  SENSATION: Significantly diminished - cannot detect location, entire L UE is dull feeling  EDEMA: minimal Lt hand  MUSCLE TONE: LUE: Moderate, Hypertonic, and Modifed Ashworth Scale 2 = More marked increase in muscle tone through most of the ROM, but affected part(s) easily moved  COGNITION: Overall cognitive status:  pt reports memory deficits  VISION: Subjective report: still reports that he need glasses for reading.  Said he was 20/30 last time he went to the eye doctor but no changes in vision from stroke  Visual history:  none  PERCEPTION: Not tested  PRAXIS: Not tested  OBSERVATIONS: Pleasant gentlemen with somewhat flat affect and significant limitations with L UE ie) flickers of movement only noted although synergy patterns noted with yawning where L elbow flexed up to 90+ degrees.   TODAY'S TREATMENT:  Orthotic Management/Manual Tech  Pt brought prefab hand splint for modification today. OTR assess finger pan and trialled various modifications for positioning long finger especially ie) for max comfort, fit and stretch of L digits/wrist without hyperextension of DIP joints. Finger pan slightly straighten, extra padding tried in different spots and straps adjusted.  Eventually, OTR able to position some extra padding under the cover of the splint and under the long finger to allow it to stretch at the PIP joint but relax at the DIP joint.  Additional padding remained in place under finger strap and presses across PIP joints to maximize stretch.  Education provided re: positioning pinkie finger under strap as it would pull out from under the strap when her  yawned several times today.  OT continued to provide assistance with stretching L UE wrist and digits, to help with resting hand splint application  He is educated in positioning for effective self stretching with decreased wrist flexion, DIP hyperextension and effective self-stretching of L hand/arm.   Self Care: OT provided education and handout regarding memory strategies for various daily tasks, such as medication management, using phone timers, writing info on sticky notes, and writing appointments in a calendar. Pt verbalized understanding and handout provided per pt instructions and listed below Use "WARM" strategy. W= write it down A=  associate it /R=  repeat it M=  make a mental picture  PATIENT EDUCATION: Education details: Memory Strategy ideas Person educat/ed: Patient Education method: Explanation, Demonstration, Tactile cues, and Verbal cues Education comprehension: verbalized understanding, returned demonstration, verbal cues required, tactile cues required, and needs further education  HOME EXERCISE PROGRAM: 07/01/23 - Shoulder table top slides & forearm supination Access Code: [email or web address] 07/20/23 - shoulder shrugs, PROM L UE wrist and digits - same access code 07/25/23 - elbow stretches and supine shoulder flexion - same access code 07/27/23 - elbow flexion/sh int rotation and towel squeeze - same access code 08/03/23 - Memory Strategies (WARM)  GOALS: Goals reviewed with patient? Yes  SHORT TERM GOALS: Target date:8/30//24  Independent with wear and care of resting hand splint for LUE up to daily. Baseline: 2-3x/week Goal status: IN PROGRESS 08/01/23 - RMI BendEase splint received 08/03/23 - Modifications to splint  2.  Independent HEP for LUE self stretches  Baseline: None established a OT POC 3 months ago Goal status: MET  3.  Pt to verbalize understanding with memory compensation strategies ie) binder for info Baseline: Self reported memory issues and limited  access to past HEPs, medication list etc. Goal status: IN PROGRESS   LONG TERM GOALS: Target date: 08/12/23  Pt to verbalize understanding with potential A/E needs for one handed use to increase independence with ADLS (rocker knife, shoe buttons, LH sponge, etc)  Baseline: Dependent on caregiver assistance for many aspects of ADLs Goal status: IN PROGRESS  2.  Pt to cut food I'ly with A/E Baseline: Dependent on caregiver assistance for many aspects of ADLs Goal status: IN PROGRESS  3.  Pt to consistently perform all UB dressing except buttons Baseline: Assisted by caregiver Goal status: IN PROGRESS  4.  Pt to perform LB dressing w/ no more than min assist with A/E prn (tie shoelaces) Baseline: Assisted by caregiver Goal status: MET 07/01/23 - patient is able to don/remove regular shoes (no splint) with laces fastened.    ASSESSMENT:  CLINICAL IMPRESSION: Patient is a 47 y.o. male who was seen today for occupational therapy treatment for L hemiplegia due to CVA. Patient currently presents with limited  active use of LUE and prefab BendEase splint modified today to further maximize comfort and stretch of digits (PIP jts) and wrist.  Pt provided memory strategy handout and to benefit from continued education for alternative methods of documentation, including using phone microphone to dictate information.  Pt motivated to improve functional use of LUE and needs extensive carryover of HEP ideas for further progression. OT staff recommend trial use of NMES/E-stim.  PERFORMANCE DEFICITS: in functional skills including ADLs, IADLs, coordination, dexterity, sensation, tone, ROM, strength, pain, flexibility, Fine motor control, Gross motor control, mobility, body mechanics, decreased knowledge of use of DME, and UE functional use, cognitive skills including memory, and psychosocial skills including coping strategies.   IMPAIRMENTS: are limiting patient from ADLs, IADLs, and rest and sleep.    CO-MORBIDITIES: may have co-morbidities  that affects occupational performance. Patient will benefit from skilled OT to address above impairments and improve overall function.  REHAB POTENTIAL: Good for established goals   PLAN:  OT FREQUENCY: 2x/week  OT DURATION: 6 weeks (plus evaluation)  PLANNED INTERVENTIONS: self care/ADL training, therapeutic exercise, therapeutic activity, neuromuscular re-education, manual therapy, passive range of motion, functional mobility training, splinting, moist heat, patient/family education, cognitive remediation/compensation, coping strategies training, and DME and/or AE instructions  RECOMMENDED OTHER SERVICES: P.T evaluating patient today also.   CONSULTED AND AGREED WITH PLAN OF CARE: Patient  PLAN FOR NEXT SESSION:   Progress NOTE - 1x/week  NMES LUE ADL check - dressing UB Check fit of new BendEase resting hand splint Progress and print self PROM exercises for LUE. Continue SciFit for UEs - check on Active Hand options provided. Progress folder/binder for collection of info needed to monitor BP, list medications, collect AE info, combine HEP ideas and assist with Memory deficits  Victorino Sparrow, OT 08/03/2023, 5:23 PM

## 2023-08-03 NOTE — Therapy (Signed)
OUTPATIENT PHYSICAL THERAPY NEURO TREATMENT - RE-CERT   Patient Name: Derek [name] MRN: [id number] DOB:Jul 17, 1976, 47 y.o., male Today's Date: 08/03/2023   PCP: Aliene Beams, MD REFERRING PROVIDER: Ranelle Oyster, MD  END OF SESSION:  PT End of Session - 08/03/23 1329     Visit Number 7    Number of Visits 13   9 + 4   Date for PT Re-Evaluation 09/09/23   pushed out due to multi-D scheduling delay at re-cert on 40/9   Authorization Type [employer]IC[employer]    Progress Note Due on Visit 10    PT Start Time 1315    PT Stop Time 1359    PT Time Calculation (min) 44 min    Equipment Utilized During Treatment Gait belt    Activity Tolerance Patient tolerated treatment well    Behavior During Therapy [facility] for tasks assessed/performed            Past Medical History:  Diagnosis Date   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/07/2020   Alcohol dependence (HCC)    Back pain    Marijuana dependence (HCC)    Tobacco dependence    Past Surgical History:  Procedure Laterality Date   BUBBLE STUDY  01/10/2020   Procedure: BUBBLE STUDY;  Surgeon: Sande Rives, MD;  Location: [facility] ENDOSCOPY;  Service: Cardiovascular;;   TEE WITHOUT CARDIOVERSION N/A 01/10/2020   Procedure: TRANSESOPHAGEAL ECHOCARDIOGRAM (TEE);  Surgeon: Sande Rives, MD;  Location: [facility] ENDOSCOPY;  Service: Cardiovascular;  Laterality: N/A;   Patient Active Problem List   Diagnosis Date Noted   Moderate major depression, single episode (HCC) 04/14/2022   Arteriosclerosis of carotid artery 02/01/2022   Essential hypertension 02/01/2022   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) 02/01/2022   History of substance abuse (HCC) 02/01/2022   Iron deficiency anemia 02/01/2022   Late effects of cerebrovascular disease 02/01/2022   Obesity 02/01/2022   Pure hypercholesterolemia 02/01/2022   Pain due to onychomycosis of toenails of both feet 06/10/2021   Neuropathic  pain 04/09/2020   Pain    Spastic hemiparesis (HCC)    History of hypertension    Dyslipidemia    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   Leukocytosis 01/12/2020   Cerebrovascular accident (CVA) due to thrombosis of right middle cerebral artery (HCC)    Chronic pain syndrome    Hypokalemia    Dysphagia, post-stroke    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/07/2020   Tobacco dependence    Marijuana dependence (HCC)    Alcohol dependence (HCC)     ONSET DATE: 2021 (CVA)  REFERRING DIAG: I63.511 (ICD-10-CM) - Right middle cerebral artery stroke (HCC) G81.10 (ICD-10-CM) - Spastic hemiparesis (HCC)  THERAPY DIAG:  Spastic hemiplegia of left nondominant 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 (HCC)  Muscle weakness (generalized)  Other abnormalities of gait and mobility  Unsteadiness on feet  Abnormal posture  Rationale for Evaluation and Treatment: Rehabilitation  SUBJECTIVE:  SUBJECTIVE STATEMENT: Patient ambulates into gym using LBQC wearing LUE splint stating he has been wearing this for an hour and would like to take it off once sitting - PT assists with this once in gym.  On assessment of left hand patient has a blister on left thumb which he states is from his cigarette lighter.  He states he has not been taking aspirin as he did not know he should be - PT encouraged MD follow-up for medication schedules.  He denies falls. Pt accompanied by: self  PERTINENT HISTORY: R MCA CVA, Alcohol Dependence, Marijuana/Tobacco Dependence, Back Pain   PAIN:  Are you having pain? Yes: NPRS scale: 3/10 Pain location: left shoulder and left hip Pain description: achy Aggravating factors: leaning on it, varies as to which positions irritate it Relieving factors: pain patch  Today's  Vitals   08/03/23 1323  BP: (!) 142/83  Pulse: 67    PRECAUTIONS: Fall and Other: L spastic hemi  WEIGHT BEARING RESTRICTIONS: No  FALLS: Has patient fallen in last 6 months? No  LIVING ENVIRONMENT: Lives with:  roommate Lives in: House/apartment Stairs: Yes: External: 8 steps; bilateral but cannot reach both Has following equipment at home: Quad cane large base, Hemi walker, Wheelchair (manual), shower chair, and Grab bars  PLOF: Independent with household mobility with device, Needs assistance with ADLs, and Needs assistance with homemaking-aide comes in the mornings for 4 hours on weekdays and 2 hours on weekends  PATIENT GOALS: "to get some mobility on my left side"  "to be able to stand longer and walk easier"  OBJECTIVE:   DIAGNOSTIC FINDINGS: 01/09/20 head CT: IMPRESSION: 1. Evolving right cerebral infarct with evidence for hemorrhagic transformation at the right basal ganglia, similar in appearance and distribution as compared to previous MRI. Associated mass effect with 3 mm of right-to-left shift. No hydrocephalus or ventricular trapping. 2. No other new acute intracranial abnormality.  COGNITION: Overall cognitive status: Within functional limits for tasks assessed   SENSATION: WFL  COORDINATION: LE RAMS:  limited by lack of DF AROM on L Heel-to-shin:  can cross left to right, but cannot bring heel directly to shin   MUSCLE TONE: LLE: Hypertonic Clonus present  POSTURE: rounded shoulders, forward head, increased thoracic kyphosis, posterior pelvic tilt, left pelvic obliquity, and weight shift right  LOWER EXTREMITY MMT:    MMT Right Eval Left Eval  Hip flexion 5 2-  Hip extension    Hip abduction 5 3  Hip adduction 5 3  Hip internal rotation    Hip external rotation    Knee flexion 4 2+/5  Knee extension 5 3 w/ increased time he can achieve full extension  Ankle dorsiflexion 5 0  Ankle plantarflexion    Ankle inversion    Ankle eversion     (Blank rows = not tested)  BED MOBILITY:  Independent per patient report - he reports his bed is lower which helps  TRANSFERS: Assistive device utilized: Quad cane large base  Sit to stand: SBA Stand to sit: SBA Chair to chair: SBA  GAIT: Gait pattern: step to pattern, decreased arm swing- Left, decreased step length- Right, decreased stance time- Left, decreased hip/knee flexion- Left, decreased ankle dorsiflexion- Left, circumduction- Left, Left hip hike, lateral lean- Right, trunk rotated posterior- Left, and poor foot clearance- Left Distance walked: various clinic distances Assistive device utilized: Quad cane large base Level of assistance: SBA Comments:  LUE held in flexion and IR and LLE lacking heel strike due to PF  tone.  Gentle L knee flexion maintained in stance due to spasticity.  FUNCTIONAL TESTS:  5 times sit to stand: 18.53 seconds w/ RUE support and right lateral lean Timed up and go (TUG): 22.69 seconds w/ CGA and LBQC Berg Balance Scale: To be finished next session.  [facility] PT Assessment - 08/03/23 1337       Berg Balance Test   Sit to Stand Able to stand  independently using hands    Standing Unsupported Able to stand safely 2 minutes    Sitting with Back Unsupported but Feet Supported on Floor or Stool Able to sit safely and securely 2 minutes    Stand to Sit Sits safely with minimal use of hands    Transfers Able to transfer safely, definite need of hands    Standing Unsupported with Eyes Closed Able to stand 10 seconds safely    Standing Unsupported with Feet Together Able to place feet together independently and stand for 1 minute with supervision    From Standing, Reach Forward with Outstretched Arm Can reach forward >5 cm safely (2")    From Standing Position, Pick up Object from Floor Able to pick up shoe, needs supervision    From Standing Position, Turn to Look Behind Over each Shoulder Looks behind from both sides and weight shifts well    Turn 360  Degrees Needs close supervision or verbal cueing    Standing Unsupported, Alternately Place Feet on Step/Stool Able to complete >2 steps/needs minimal assist    Standing Unsupported, One Foot in Front Able to take small step independently and hold 30 seconds    Standing on One Leg Tries to lift leg/unable to hold 3 seconds but remains standing independently    Total Score 39    Berg comment: 39/56 = significant fall risk            PATIENT SURVEYS:  FOTO not captured due to chronicity of CVA.  TODAY'S TREATMENT:    RUE in sitting prior to session:                                                                                                                           Today's Vitals   08/03/23 1323  BP: (!) 142/83  Pulse: 67   -See subjective for onset of session tasks. -5xSTS:  14.78 sec w/ RUE support and right weight shift  -BERG (pt goes to restroom x 4 minutes midway through assessment):  [facility] PT Assessment - 08/03/23 1337       Berg Balance Test   Sit to Stand Able to stand  independently using hands    Standing Unsupported Able to stand safely 2 minutes    Sitting with Back Unsupported but Feet Supported on Floor or Stool Able to sit safely and securely 2 minutes    Stand to Sit Sits safely with minimal use of hands    Transfers Able to transfer safely, definite need of hands  Standing Unsupported with Eyes Closed Able to stand 10 seconds safely    Standing Unsupported with Feet Together Able to place feet together independently and stand for 1 minute with supervision    From Standing, Reach Forward with Outstretched Arm Can reach forward >5 cm safely (2")    From Standing Position, Pick up Object from Floor Able to pick up shoe, needs supervision    From Standing Position, Turn to Look Behind Over each Shoulder Looks behind from both sides and weight shifts well    Turn 360 Degrees Needs close supervision or verbal cueing    Standing Unsupported, Alternately Place  Feet on Step/Stool Able to complete >2 steps/needs minimal assist    Standing Unsupported, One Foot in Front Able to take small step independently and hold 30 seconds    Standing on One Leg Tries to lift leg/unable to hold 3 seconds but remains standing independently    Total Score 39    Berg comment: 39/56 = significant fall risk            PATIENT EDUCATION: Education details: Monitor BP as able.  Continue HEP.   Progress towards goals. Person educated: Patient Education method: Explanation Education comprehension: verbalized understanding and needs further education  HOME EXERCISE PROGRAM: [id number] - added SLS and tandem at counter on 07/20/2023  GOALS: Goals reviewed with patient? Yes  SHORT TERM GOALS: Target date: 06/24/2023  Pt will be independent and compliant with strengthening, stretching, and balance focused HEP in order to maintain functional progress and improve mobility. Baseline:  Reviewed (9/4) Goal status: IN PROGRESS  2.  Pt will decrease 5xSTS to </=15.53 seconds using RUE support as needed with improved midline in order to demonstrate decreased risk for falls and improved functional bilateral LE strength and power. Baseline: 18.53 sec w/ RUE support and right weight shift; 19.28 sec w/ RUE support and right weight shift (9/4) Goal status: NOT MET  3.  Pt will demonstrate TUG of </=18.69 seconds at SBA level in order to decrease risk of falls and improve functional mobility using LRAD. Baseline: 22.69 sec CGA w/ LBQC; 23.81 sec SBA w/ LBQC (9/4) Goal status: NOT MET  LONG TERM GOALS: Target date: 07/22/2023 (Note:  due to scheduling, likely assess single goal at LTG and update date for last visit at 10 week mark)  Pt will decrease 5xSTS to </=12.53 seconds using RUE support as needed with improved midline in order to demonstrate decreased risk for falls and improved functional bilateral LE strength and power. Baseline: 18.53 sec w/ RUE support and right weight  shift; 14.78 sec w/ RUE support and right weight shift (10/2) Goal status: PARTIALLY MET  2.  Pt will demonstrate TUG of </=14.69 seconds at SBA level in order to decrease risk of falls and improve functional mobility using LRAD. Baseline: 22.69 sec CGA w/ LBQC Goal status: INITIAL  3.  Pt will increase BERG balance score to at least 41/56 to demonstrate improved static balance. Baseline: 36/56 (8/30); 39/56 (10/2) Goal status: PARTIALLY MET  4.  Pt will ambulate >/=250 feet with LBQC and no more than SBA w/o increased shoulder pain and improved left LE control to promote household and community access.  Baseline:  L shoulder pain 4-5/10, left knee flexion in stance; pt has increased LLE tone present during all ambulation today and baseline left shoulder pain 3/10 (10/2)  Goal status:  NOT MET  GOALS (for re-cert): Goals reviewed with patient? Yes  SHORT TERM GOALS = LONG  TERM GOALS: Target date: 09/02/2023  Pt will decrease 5xSTS to </=10.53 seconds using RUE support as needed with improved midline in order to demonstrate decreased risk for falls and improved functional bilateral LE strength and power. Baseline: 18.53 sec w/ RUE support and right weight shift; 14.78 sec w/ RUE support and right weight shift (10/2) Goal status: REVISED  2.  Pt will demonstrate TUG of </=14.69 seconds at SBA level in order to decrease risk of falls and improve functional mobility using LRAD. Baseline: 22.69 sec CGA w/ LBQC Goal status: INITIAL  3.  Pt will increase BERG balance score to at least 43/56 to demonstrate improved static balance. Baseline: 36/56 (8/30); 39/56 (10/2) Goal status: REVISED  ASSESSMENT:  CLINICAL IMPRESSION: Assessed LTGs this visit and discharged goal #4 at re-cert as pt is showing no consistent improvement in chronic hemibody pain or LE motor control.  His tone appeared worse in his LE today.  He did make modest progress towards both 5xSTS and BERG as written, but did not  significantly alter his objective fall risk.  Will continue with short POC to continue addressing left hemibody weakness and imbalance as well as gait mechanics as able.  OBJECTIVE IMPAIRMENTS: Abnormal gait, decreased activity tolerance, decreased balance, decreased coordination, decreased endurance, decreased knowledge of condition, decreased knowledge of use of DME, difficulty walking, decreased strength, impaired perceived functional ability, impaired tone, impaired UE functional use, postural dysfunction, and pain.   ACTIVITY LIMITATIONS: carrying, lifting, bending, standing, squatting, stairs, bathing, dressing, hygiene/grooming, locomotion level, and caring for others  PARTICIPATION LIMITATIONS: meal prep, cleaning, interpersonal relationship, driving, shopping, community activity, and occupation  PERSONAL FACTORS: Behavior pattern, Fitness, Past/current experiences, Time since onset of injury/illness/exacerbation, Transportation, and 3+ comorbidities: tobacco dependence, HTN, ETOH, chronic back pain  are also affecting patient's functional outcome.   REHAB POTENTIAL: Fair See PMH and personal factors  CLINICAL DECISION MAKING: Evolving/moderate complexity  EVALUATION COMPLEXITY: Moderate  PLAN:  PT FREQUENCY: 1x/week  PT DURATION: 10 weeks + 4 weeks (re-cert)  PLANNED INTERVENTIONS: Therapeutic exercises, Therapeutic activity, Neuromuscular re-education, Balance training, Gait training, Patient/Family education, Self Care, Joint mobilization, Stair training, Vestibular training, Visual/preceptual remediation/compensation, Orthotic/Fit training, DME instructions, Aquatic Therapy, Electrical stimulation, Taping, Manual therapy, and Re-evaluation  PLAN FOR NEXT SESSION: ASSESS R BP!  Modify HEP prn for LLE strengthening, static and dynamic balance, spasticity management (stretching - review HEP stretching and modify as needed!).  Gait training/pre-gait - pt uses LBQC, left quad control -  step taps/overs,  ADDRESS LEFT TRAP TIGHTNESS - IASTM?  TAPING?  Sadie Haber, PT, DPT 08/03/2023, 2:02 PM

## 2023-08-04 NOTE — Patient Instructions (Signed)

## 2023-08-05 ENCOUNTER — Encounter: Payer: Medicare HMO | Admitting: Occupational Therapy

## 2023-08-08 ENCOUNTER — Ambulatory Visit: Payer: Medicare HMO | Admitting: Occupational Therapy

## 2023-08-08 DIAGNOSIS — M6281 Muscle weakness (generalized): Secondary | ICD-10-CM | POA: Diagnosis not present

## 2023-08-08 DIAGNOSIS — R2681 Unsteadiness on feet: Secondary | ICD-10-CM | POA: Diagnosis not present

## 2023-08-08 DIAGNOSIS — R293 Abnormal posture: Secondary | ICD-10-CM | POA: Diagnosis not present

## 2023-08-08 DIAGNOSIS — M24542 Contracture, left hand: Secondary | ICD-10-CM | POA: Diagnosis not present

## 2023-08-08 DIAGNOSIS — R278 Other lack of coordination: Secondary | ICD-10-CM | POA: Diagnosis not present

## 2023-08-08 DIAGNOSIS — M79602 Pain in left arm: Secondary | ICD-10-CM | POA: Diagnosis not present

## 2023-08-08 DIAGNOSIS — I69354 Hemiplegia and hemiparesis following cerebral infarction affecting left non-dominant side: Secondary | ICD-10-CM

## 2023-08-08 DIAGNOSIS — R2689 Other abnormalities of gait and mobility: Secondary | ICD-10-CM | POA: Diagnosis not present

## 2023-08-08 DIAGNOSIS — G8110 Spastic hemiplegia affecting unspecified side: Secondary | ICD-10-CM | POA: Diagnosis not present

## 2023-08-08 NOTE — Therapy (Unsigned)
OUTPATIENT OCCUPATIONAL THERAPY NEURO TREATMENT & PROGRESS NOTE  Patient Name: ROGAN WIGLEY MRN: [id number] DOB:1976-04-13, 47 y.o., male 28 Date: 08/08/2023  PCP: Aliene Beams, MD REFERRING PROVIDER: Ranelle Oyster, MD  END OF SESSION:  OT End of Session - 08/08/23 1402     Visit Number 10    Number of Visits 12   + evaluation   Date for OT Re-Evaluation 08/12/23    Authorization Type [employer]    Progress Note Due on Visit 10    OT Start Time 1400    OT Stop Time 1445    OT Time Calculation (min) 45 min    Equipment Utilized During Treatment theraband    Activity Tolerance Patient tolerated treatment well    Behavior During Therapy [facility] for tasks assessed/performed              Past Medical History:  Diagnosis Date   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/07/2020   Alcohol dependence (HCC)    Back pain    Marijuana dependence (HCC)    Tobacco dependence    Past Surgical History:  Procedure Laterality Date   BUBBLE STUDY  01/10/2020   Procedure: BUBBLE STUDY;  Surgeon: Sande Rives, MD;  Location: [facility] ENDOSCOPY;  Service: Cardiovascular;;   TEE WITHOUT CARDIOVERSION N/A 01/10/2020   Procedure: TRANSESOPHAGEAL ECHOCARDIOGRAM (TEE);  Surgeon: Sande Rives, MD;  Location: [facility] ENDOSCOPY;  Service: Cardiovascular;  Laterality: N/A;   Patient Active Problem List   Diagnosis Date Noted   Moderate major depression, single episode (HCC) 04/14/2022   Arteriosclerosis of carotid artery 02/01/2022   Essential hypertension 02/01/2022   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) 02/01/2022   History of substance abuse (HCC) 02/01/2022   Iron deficiency anemia 02/01/2022   Late effects of cerebrovascular disease 02/01/2022   Obesity 02/01/2022   Pure hypercholesterolemia 02/01/2022   Pain due to onychomycosis of toenails of both feet 06/10/2021   Neuropathic pain 04/09/2020   Pain    Spastic  hemiparesis (HCC)    History of hypertension    Dyslipidemia    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   Leukocytosis 01/12/2020   Cerebrovascular accident (CVA) due to thrombosis of right middle cerebral artery (HCC)    Chronic pain syndrome    Hypokalemia    Dysphagia, post-stroke    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/07/2020   Tobacco dependence    Marijuana dependence (HCC)    Alcohol dependence (HCC)     ONSET DATE: referral: 05/03/2023  REFERRING DIAG: N82.956 (ICD-10-CM) - Right middle cerebral artery stroke (HCC) G81.10 (ICD-10-CM) - Spastic hemiparesis (HCC)  DX: spasticity LUE s/p botox pecs and finger flexors on 02/09/23 - scheduled again next month (August) DX: CVA with spastic left hemiparesis RX: Eval and treat, resume therapies, bp better controlledDX: CVA with spastic left hemiparesis RX: Eval and treat, resume therapies, bp better controlled THERAPY DIAG:  Contracture, left hand  Muscle weakness (generalized)  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  Pt has not had Baclofen refilled at this time and is encouraged to discuss medications with MD as he as a different muscle relaxer still.   Pt  accompanied by: self   PERTINENT HISTORY: Spastic L Hemiparesis.  PMH: R MCA CVA, Alcohol Dependence (occasional), Marijuana (NA)/Tobacco ([place]) Dependence, Back Pain  PRECAUTIONS: Other: no driving  WEIGHT BEARING RESTRICTIONS: No  PAIN:  Are you having pain? Yes: NPRS scale: 4-5/10 Pain location: LUE - shoulder/hip L  Pain description: aching  Aggravating factors: laying on it Relieving factors: Pain patch (doesn't have rx anymore) stretches, pain meds  FALLS: Has patient fallen in  last 6 months? No  LIVING ENVIRONMENT: Lives with:  roommate Lives in: House 8 steps to enter (B rails but uses R side rail going up), 1 level home Has following equipment at home: Quad cane small base, Wheelchair (manual), Shower bench, and bed side commode - over the toilet in the bathroom, urinal at night   PLOF: Independent prior to stroke 2021, on disability  PATIENT GOALS: Improve Lt arm mobility and balance  OBJECTIVE:   HAND DOMINANCE: Right  ADLs: Overall ADLs: Has aide M-Sun 2-4 hrs/day to assist with bathing, and some dressing, cooking and cleaning Transfers/ambulation related to ADLs: mod I w/ quad cane Eating: mod I eating, difficulty cutting food and occasional assist from aide Grooming: mod I brushing teeth and shaving UB Dressing: mod I for pull over shirt, assist for button up shirt LB Dressing: Aide assist getting pants over feet and assist for donning shoes and socks Toileting: mod I Bathing: max assist from aide Tub Shower transfers: min assist for leg management with tub bench Equipment: Transfer tub bench, Grab bars, and hand held shower  IADLs: Shopping: has groceries delivered [employer]: aide does Meal Prep: aide or roommate does but can get snack or heat up something in microwave after someone gets it out of the fridge etc for him Community mobility: relies on friends Medication management: independent s/p aide sorting it into his pill box Financial management: roommate does Handwriting:  denies change  MOBILITY STATUS:  walks with quad cane in house and short distances   UPPER EXTREMITY ROM:  RUE AROM WNL's  LUE: Limited active movement - sh shrug w/ slight elbow flex dominated by synergy pattern  Passive ROM Right eval Left eval  Shoulder flexion  90*  Shoulder abduction  80*  Shoulder adduction    Shoulder extension    Shoulder internal rotation    Shoulder external rotation    Elbow flexion  90%  Elbow extension  90%  Wrist  flexion  90%  Wrist extension  25%  Wrist ulnar deviation    Wrist radial deviation    Wrist pronation    Wrist supination  90%  (Blank rows = not tested)  Passive finger extension full with wrist flexion. Difficulty at PIP joint extension, especially LF - at rest, PIP flexed, DIP hyperextended  HAND FUNCTION: No function LT HAND  COORDINATION: No function Lt hand  SENSATION: Significantly diminished - cannot detect location, entire L UE is dull feeling  EDEMA: minimal Lt hand  MUSCLE TONE: LUE: Moderate, Hypertonic, and Modifed Ashworth Scale 2 = More marked increase in muscle tone through most of the ROM, but affected part(s) easily moved  COGNITION: Overall cognitive status:  pt reports memory deficits  VISION: Subjective report: still reports that he need glasses for reading.  Said he was 20/30 last time he went to the eye doctor but no changes in vision from stroke  Visual history:  none  PERCEPTION: Not tested  PRAXIS: Not tested  OBSERVATIONS: Pleasant gentlemen with somewhat flat affect and significant  limitations with L UE ie) flickers of movement only noted although synergy patterns noted with yawning where L elbow flexed up to 90+ degrees.   TODAY'S TREATMENT:                                                                                                                               Manual Stretches provided prior to Neuromuscular Re-ed to prepare LUE for ROM.  Neuromuscular reeducation provided including specific visual, tactile and verbal cues and assistance to stimulate the neuromuscular system and promote functional movement in LUE.  Reviewed and practiced motions he can perform with LUE support to move arm initially with support from OT and then with support of theraband loop to allow pt to feel/see movements without physical assist from OT but with gravity eliminated to help with motions ie) to push arm back and forth as able to with elbow/shoulder motions  and even working with B hands on band to pull down softest yellow theraband.  PATIENT EDUCATION: Education details: B UE ROM with gravity eliminated Person educat/ed: Patient Education method: Explanation, Demonstration, Tactile cues, and Verbal cues Education comprehension: verbalized understanding, returned demonstration, verbal cues required, tactile cues required, and needs further education  HOME EXERCISE PROGRAM: 07/01/23 - Shoulder table top slides & forearm supination Access Code: [email or web address] 07/20/23 - shoulder shrugs, PROM L UE wrist and digits - same access code 07/25/23 - elbow stretches and supine shoulder flexion - same access code 07/27/23 - elbow flexion/sh int rotation and towel squeeze - same access code 08/03/23 - Memory Strategies (WARM)  GOALS: Goals reviewed with patient? Yes  SHORT TERM GOALS: Target date:8/30//24  Independent with wear and care of resting hand splint for LUE up to daily. Baseline: 2-3x/week Goal status: MET 08/01/23 - RMI BendEase splint received 08/03/23 - Modifications to splint 08/08/23 - Most of night or couple of hours in the day  2.  Independent HEP for LUE self stretches  Baseline: None established a OT POC 3 months ago Goal status: MET  3.  Pt to verbalize understanding with memory compensation strategies ie) binder for info Baseline: Self reported memory issues and limited access to past HEPs, medication list etc. Goal status: IN PROGRESS   LONG TERM GOALS: Target date: 08/12/23  Pt to verbalize understanding with potential A/E needs for one handed use to increase independence with ADLS (rocker knife, shoe buttons, LH sponge, etc)  Baseline: Dependent on caregiver assistance for many aspects of ADLs Goal status: IN PROGRESS  2.  Pt to cut food I'ly with A/E Baseline: Dependent on caregiver assistance for many aspects of ADLs Goal status: IN PROGRESS  3.  Pt to consistently perform all UB dressing except buttons Baseline: Assisted  by caregiver Goal status: MET  4.  Pt to perform LB dressing w/ no more than min assist with A/E prn (tie shoelaces) Baseline: Assisted by caregiver Goal status: MET 07/01/23 - patient is able to don/remove regular shoes (  no splint) with laces fastened.    ASSESSMENT:  CLINICAL IMPRESSION: Patient is a 47 y.o. male who was seen today for occupational therapy treatment for L hemiplegia due to CVA. Patient currently presents with limited active use of LUE and HEP ideas continued to be explored to maximize comfort and stretch of digits (PIP jts) and wrist and AROM of elbow/shoulder.  Pt motivated to improve functional use of LUE and needs extensive carryover of HEP ideas for further progression. OT staff recommend trial use of NMES/E-stim.  This 10th progress note is for dates: 05/25/23 to 08/08/2023. Pt has met 4/4 STGs and 2/4 LTGs. Pt making progress towards goals as expected and continues to benefit from skilled OT services in the outpatient setting to work towards remaining goals or until max rehab potential is met.    PERFORMANCE DEFICITS: in functional skills including ADLs, IADLs, coordination, dexterity, sensation, tone, ROM, strength, pain, flexibility, Fine motor control, Gross motor control, mobility, body mechanics, decreased knowledge of use of DME, and UE functional use, cognitive skills including memory, and psychosocial skills including coping strategies.   IMPAIRMENTS: are limiting patient from ADLs, IADLs, and rest and sleep.   CO-MORBIDITIES: may have co-morbidities  that affects occupational performance. Patient will benefit from skilled OT to address above impairments and improve overall function.  REHAB POTENTIAL: Good for established goals   PLAN:  OT FREQUENCY: 1x/week  OT DURATION: 4 weeks additional weeks   PLANNED INTERVENTIONS: self care/ADL training, therapeutic exercise, therapeutic activity, neuromuscular re-education, manual therapy, passive range of motion,  functional mobility training, splinting, moist heat, patient/family education, cognitive remediation/compensation, coping strategies training, and DME and/or AE instructions  RECOMMENDED OTHER SERVICES: P.T evaluating patient today also.   CONSULTED AND AGREED WITH PLAN OF CARE: Patient  PLAN FOR NEXT SESSION:   NMES LUE ADL check - dressing UB Check fit of new BendEase resting hand splint Progress and print self PROM exercises for LUE. Continue SciFit for UEs - check on Active Hand options provided. Progress folder/binder for collection of info needed to monitor BP, list medications, collect AE info, combine HEP ideas and assist with Memory deficits  Victorino Sparrow, OT 08/08/2023, 4:34 PM

## 2023-08-09 ENCOUNTER — Encounter: Payer: Self-pay | Admitting: Physical Therapy

## 2023-08-09 ENCOUNTER — Ambulatory Visit: Payer: Medicare HMO | Admitting: Physical Therapy

## 2023-08-09 VITALS — BP 128/92 | HR 67

## 2023-08-09 DIAGNOSIS — M6281 Muscle weakness (generalized): Secondary | ICD-10-CM

## 2023-08-09 DIAGNOSIS — R293 Abnormal posture: Secondary | ICD-10-CM

## 2023-08-09 DIAGNOSIS — M79602 Pain in left arm: Secondary | ICD-10-CM | POA: Diagnosis not present

## 2023-08-09 DIAGNOSIS — I69354 Hemiplegia and hemiparesis following cerebral infarction affecting left non-dominant side: Secondary | ICD-10-CM

## 2023-08-09 DIAGNOSIS — R278 Other lack of coordination: Secondary | ICD-10-CM | POA: Diagnosis not present

## 2023-08-09 DIAGNOSIS — R2681 Unsteadiness on feet: Secondary | ICD-10-CM

## 2023-08-09 DIAGNOSIS — M24542 Contracture, left hand: Secondary | ICD-10-CM | POA: Diagnosis not present

## 2023-08-09 DIAGNOSIS — R2689 Other abnormalities of gait and mobility: Secondary | ICD-10-CM | POA: Diagnosis not present

## 2023-08-09 DIAGNOSIS — G8110 Spastic hemiplegia affecting unspecified side: Secondary | ICD-10-CM | POA: Diagnosis not present

## 2023-08-09 NOTE — Therapy (Signed)
OUTPATIENT PHYSICAL THERAPY NEURO TREATMENT   Patient Name: Derek [name] MRN: [id number] DOB:[birth year], 47 y.o., male Today's Date: 08/09/2023   PCP: Aliene Beams, MD REFERRING PROVIDER: Ranelle Oyster, MD  END OF SESSION:  PT End of Session - 08/09/23 1118     Visit Number 8    Number of Visits 13   9 + 4   Date for PT Re-Evaluation 09/09/23   pushed out due to multi-D scheduling delay at re-cert on 40/9   Authorization Type [employer]IC[employer]    Progress Note Due on Visit 10    PT Start Time 1112   pt arrived late to session   PT Stop Time 1146    PT Time Calculation (min) 34 min    Equipment Utilized During Treatment Gait belt    Activity Tolerance Patient tolerated treatment well;Treatment limited secondary to medical complications (Comment)   HTN   Behavior During Therapy [facility] for tasks assessed/performed            Past Medical History:  Diagnosis Date   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/07/2020   Alcohol dependence (HCC)    Back pain    Marijuana dependence (HCC)    Tobacco dependence    Past Surgical History:  Procedure Laterality Date   BUBBLE STUDY  01/10/2020   Procedure: BUBBLE STUDY;  Surgeon: Sande Rives, MD;  Location: [facility] ENDOSCOPY;  Service: Cardiovascular;;   TEE WITHOUT CARDIOVERSION N/A 01/10/2020   Procedure: TRANSESOPHAGEAL ECHOCARDIOGRAM (TEE);  Surgeon: Sande Rives, MD;  Location: [facility] ENDOSCOPY;  Service: Cardiovascular;  Laterality: N/A;   Patient Active Problem List   Diagnosis Date Noted   Moderate major depression, single episode (HCC) 04/14/2022   Arteriosclerosis of carotid artery 02/01/2022   Essential hypertension 02/01/2022   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) 02/01/2022   History of substance abuse (HCC) 02/01/2022   Iron deficiency anemia 02/01/2022   Late effects of cerebrovascular disease 02/01/2022   Obesity 02/01/2022   Pure hypercholesterolemia  02/01/2022   Pain due to onychomycosis of toenails of both feet 06/10/2021   Neuropathic pain 04/09/2020   Pain    Spastic hemiparesis (HCC)    History of hypertension    Dyslipidemia    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   Leukocytosis 01/12/2020   Cerebrovascular accident (CVA) due to thrombosis of right middle cerebral artery (HCC)    Chronic pain syndrome    Hypokalemia    Dysphagia, post-stroke    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/07/2020   Tobacco dependence    Marijuana dependence (HCC)    Alcohol dependence (HCC)     ONSET DATE: 2021 (CVA)  REFERRING DIAG: I63.511 (ICD-10-CM) - Right middle cerebral artery stroke (HCC) G81.10 (ICD-10-CM) - Spastic hemiparesis (HCC)  THERAPY DIAG:  Muscle weakness (generalized)  Spastic hemiplegia of left nondominant 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 (HCC)  Other abnormalities of gait and mobility  Unsteadiness on feet  Abnormal posture  Rationale for Evaluation and Treatment: Rehabilitation  SUBJECTIVE:  SUBJECTIVE STATEMENT: Patient ambulates into gym using LBQC.  He denies falls.  He states he has resumed taking his baby aspirin as he had previously lost the bottle.  He states he smoked a cigarette prior to PT today. Pt accompanied by: self  PERTINENT HISTORY: R MCA CVA, Alcohol Dependence, Marijuana/Tobacco Dependence, Back Pain   PAIN:  Are you having pain? Yes: NPRS scale: 5/10 Pain location: left shoulder and left hip Pain description: achy Aggravating factors: leaning on it, varies as to which positions irritate it Relieving factors: pain patch  Today's Vitals   08/09/23 1117 08/09/23 1121  BP: (!) 150/99 (!) 128/92  Pulse: 75 67    PRECAUTIONS: Fall and Other: L spastic hemi  WEIGHT BEARING  RESTRICTIONS: No  FALLS: Has patient fallen in last 6 months? No  LIVING ENVIRONMENT: Lives with:  roommate Lives in: House/apartment Stairs: Yes: External: 8 steps; bilateral but cannot reach both Has following equipment at home: Quad cane large base, Hemi walker, Wheelchair (manual), shower chair, and Grab bars  PLOF: Independent with household mobility with device, Needs assistance with ADLs, and Needs assistance with homemaking-aide comes in the mornings for 4 hours on weekdays and 2 hours on weekends  PATIENT GOALS: "to get some mobility on my left side"  "to be able to stand longer and walk easier"  OBJECTIVE:   DIAGNOSTIC FINDINGS: 01/09/20 head CT: IMPRESSION: 1. Evolving right cerebral infarct with evidence for hemorrhagic transformation at the right basal ganglia, similar in appearance and distribution as compared to previous MRI. Associated mass effect with 3 mm of right-to-left shift. No hydrocephalus or ventricular trapping. 2. No other new acute intracranial abnormality.  COGNITION: Overall cognitive status: Within functional limits for tasks assessed   SENSATION: WFL  COORDINATION: LE RAMS:  limited by lack of DF AROM on L Heel-to-shin:  can cross left to right, but cannot bring heel directly to shin   MUSCLE TONE: LLE: Hypertonic Clonus present  POSTURE: rounded shoulders, forward head, increased thoracic kyphosis, posterior pelvic tilt, left pelvic obliquity, and weight shift right  LOWER EXTREMITY MMT:    MMT Right Eval Left Eval  Hip flexion 5 2-  Hip extension    Hip abduction 5 3  Hip adduction 5 3  Hip internal rotation    Hip external rotation    Knee flexion 4 2+/5  Knee extension 5 3 w/ increased time he can achieve full extension  Ankle dorsiflexion 5 0  Ankle plantarflexion    Ankle inversion    Ankle eversion    (Blank rows = not tested)  BED MOBILITY:  Independent per patient report - he reports his bed is lower which  helps  TRANSFERS: Assistive device utilized: Quad cane large base  Sit to stand: SBA Stand to sit: SBA Chair to chair: SBA  GAIT: Gait pattern: step to pattern, decreased arm swing- Left, decreased step length- Right, decreased stance time- Left, decreased hip/knee flexion- Left, decreased ankle dorsiflexion- Left, circumduction- Left, Left hip hike, lateral lean- Right, trunk rotated posterior- Left, and poor foot clearance- Left Distance walked: various clinic distances Assistive device utilized: Quad cane large base Level of assistance: SBA Comments:  LUE held in flexion and IR and LLE lacking heel strike due to PF tone.  Gentle L knee flexion maintained in stance due to spasticity.  FUNCTIONAL TESTS:  5 times sit to stand: 18.53 seconds w/ RUE support and right lateral lean Timed up and go (TUG): 22.69 seconds w/ CGA and LBQC Berg  Balance Scale: To be finished next session.   PATIENT SURVEYS:  FOTO not captured due to chronicity of CVA.  TODAY'S TREATMENT:    RUE in sitting prior to session:                                                                                                                           Today's Vitals   08/09/23 1117 08/09/23 1121  BP: (!) 150/99 (!) 128/92  Pulse: 75 67  Readings taken several minutes apart (second reading following quiet rest)  STM to left upper trap, levator scapulae, rhomboid minor, and cervical paraspinals for pain relief, some mid trap tightness noted, no significant trigger point.  Palpated supraspinatus with sublux, pt mostly pinpoint tender here.  Positioned pt into supported left shoulder neutral with improved scapular position.  Trialed kinesiotaping to support left shoulder and provide tactile input to supraspinatus.  May trial rigid taping for posture if pt responds well to this modality.  Of note:  Tape may not stay well due to significant prepping of the area with patient having used vaseline on skin this morning outside  areas of STM.    PATIENT EDUCATION: Education details: Monitor BP as able.  Continue HEP.   Goals of trialing taping today for pain relief.  Discussed flexible taping (as done today) vs rigid taping potential for future and s/s of adhesive irritation and to remove tape gently if experiencing.  Pat tape dry following shower and otherwise leave it be to allow to roll off naturally over time. Person educated: Patient Education method: Explanation Education comprehension: verbalized understanding and needs further education  HOME EXERCISE PROGRAM: [id number] - added SLS and tandem at counter on 07/20/2023  GOALS: Goals reviewed with patient? Yes  SHORT TERM GOALS: Target date: 06/24/2023  Pt will be independent and compliant with strengthening, stretching, and balance focused HEP in order to maintain functional progress and improve mobility. Baseline:  Reviewed (9/4) Goal status: IN PROGRESS  2.  Pt will decrease 5xSTS to </=15.53 seconds using RUE support as needed with improved midline in order to demonstrate decreased risk for falls and improved functional bilateral LE strength and power. Baseline: 18.53 sec w/ RUE support and right weight shift; 19.28 sec w/ RUE support and right weight shift (9/4) Goal status: NOT MET  3.  Pt will demonstrate TUG of </=18.69 seconds at SBA level in order to decrease risk of falls and improve functional mobility using LRAD. Baseline: 22.69 sec CGA w/ LBQC; 23.81 sec SBA w/ LBQC (9/4) Goal status: NOT MET  LONG TERM GOALS: Target date: 07/22/2023 (Note:  due to scheduling, likely assess single goal at LTG and update date for last visit at 10 week mark)  Pt will decrease 5xSTS to </=12.53 seconds using RUE support as needed with improved midline in order to demonstrate decreased risk for falls and improved functional bilateral LE strength and power. Baseline: 18.53 sec w/ RUE support and right weight shift; 14.78 sec  w/ RUE support and right weight shift  (10/2) Goal status: PARTIALLY MET  2.  Pt will demonstrate TUG of </=14.69 seconds at SBA level in order to decrease risk of falls and improve functional mobility using LRAD. Baseline: 22.69 sec CGA w/ LBQC Goal status: INITIAL  3.  Pt will increase BERG balance score to at least 41/56 to demonstrate improved static balance. Baseline: 36/56 (8/30); 39/56 (10/2) Goal status: PARTIALLY MET  4.  Pt will ambulate >/=250 feet with LBQC and no more than SBA w/o increased shoulder pain and improved left LE control to promote household and community access.  Baseline:  L shoulder pain 4-5/10, left knee flexion in stance; pt has increased LLE tone present during all ambulation today and baseline left shoulder pain 3/10 (10/2)  Goal status:  NOT MET  GOALS (for re-cert): Goals reviewed with patient? Yes  SHORT TERM GOALS = LONG TERM GOALS: Target date: 09/02/2023  Pt will decrease 5xSTS to </=10.53 seconds using RUE support as needed with improved midline in order to demonstrate decreased risk for falls and improved functional bilateral LE strength and power. Baseline: 18.53 sec w/ RUE support and right weight shift; 14.78 sec w/ RUE support and right weight shift (10/2) Goal status: REVISED  2.  Pt will demonstrate TUG of </=14.69 seconds at SBA level in order to decrease risk of falls and improve functional mobility using LRAD. Baseline: 22.69 sec CGA w/ LBQC Goal status: INITIAL  3.  Pt will increase BERG balance score to at least 43/56 to demonstrate improved static balance. Baseline: 36/56 (8/30); 39/56 (10/2) Goal status: REVISED  ASSESSMENT:  CLINICAL IMPRESSION: Session limited to seated STM and taping of left shoulder musculature for both pain management and hopeful improvement of chronic rounded subluxed left shoulder posture post-CVA.  He was instructed to practice upright seated postural correction at home to aide these efforts as pt tends to give into chronic posturing.  He would  benefit from further spasticity and weakness interventions for both upper and lower left hemibody.  Will continue per POC.  OBJECTIVE IMPAIRMENTS: Abnormal gait, decreased activity tolerance, decreased balance, decreased coordination, decreased endurance, decreased knowledge of condition, decreased knowledge of use of DME, difficulty walking, decreased strength, impaired perceived functional ability, impaired tone, impaired UE functional use, postural dysfunction, and pain.   ACTIVITY LIMITATIONS: carrying, lifting, bending, standing, squatting, stairs, bathing, dressing, hygiene/grooming, locomotion level, and caring for others  PARTICIPATION LIMITATIONS: meal prep, cleaning, interpersonal relationship, driving, shopping, community activity, and occupation  PERSONAL FACTORS: Behavior pattern, Fitness, Past/current experiences, Time since onset of injury/illness/exacerbation, Transportation, and 3+ comorbidities: tobacco dependence, HTN, ETOH, chronic back pain  are also affecting patient's functional outcome.   REHAB POTENTIAL: [place] PMH and personal factors  CLINICAL DECISION MAKING: Evolving/moderate complexity  EVALUATION COMPLEXITY: Moderate  PLAN:  PT FREQUENCY: 1x/week  PT DURATION: 10 weeks + 4 weeks (re-cert)  PLANNED INTERVENTIONS: Therapeutic exercises, Therapeutic activity, Neuromuscular re-education, Balance training, Gait training, Patient/Family education, Self Care, Joint mobilization, Stair training, Vestibular training, Visual/preceptual remediation/compensation, Orthotic/Fit training, DME instructions, Aquatic Therapy, Electrical stimulation, Taping, Manual therapy, and Re-evaluation  PLAN FOR NEXT SESSION: ASSESS R BP!  Modify HEP prn for LLE strengthening, static and dynamic balance, spasticity management (stretching - review HEP stretching and modify as needed!).  Gait training/pre-gait - pt uses LBQC, left quad control - step taps/overs,  RIGID TAPING?  Sadie Haber, PT, DPT 08/09/2023, 12:03 PM

## 2023-08-10 ENCOUNTER — Encounter: Payer: Medicare HMO | Admitting: Occupational Therapy

## 2023-08-12 ENCOUNTER — Encounter: Payer: Medicare HMO | Admitting: Occupational Therapy

## 2023-08-15 ENCOUNTER — Ambulatory Visit: Payer: Medicare HMO | Admitting: Occupational Therapy

## 2023-08-15 DIAGNOSIS — R278 Other lack of coordination: Secondary | ICD-10-CM | POA: Diagnosis not present

## 2023-08-15 DIAGNOSIS — M79602 Pain in left arm: Secondary | ICD-10-CM

## 2023-08-15 DIAGNOSIS — I69354 Hemiplegia and hemiparesis following cerebral infarction affecting left non-dominant side: Secondary | ICD-10-CM | POA: Diagnosis not present

## 2023-08-15 DIAGNOSIS — M6281 Muscle weakness (generalized): Secondary | ICD-10-CM | POA: Diagnosis not present

## 2023-08-15 DIAGNOSIS — R2689 Other abnormalities of gait and mobility: Secondary | ICD-10-CM | POA: Diagnosis not present

## 2023-08-15 DIAGNOSIS — R293 Abnormal posture: Secondary | ICD-10-CM

## 2023-08-15 DIAGNOSIS — R2681 Unsteadiness on feet: Secondary | ICD-10-CM | POA: Diagnosis not present

## 2023-08-15 DIAGNOSIS — M24542 Contracture, left hand: Secondary | ICD-10-CM | POA: Diagnosis not present

## 2023-08-15 DIAGNOSIS — G8110 Spastic hemiplegia affecting unspecified side: Secondary | ICD-10-CM | POA: Diagnosis not present

## 2023-08-15 NOTE — Therapy (Unsigned)
OUTPATIENT OCCUPATIONAL THERAPY NEURO TREATMENT   Patient Name: ANGUS AMINI MRN: [id number] DOB:10-May-1976, 47 y.o., male Today's Date: 08/15/2023  PCP: Aliene Beams, MD REFERRING PROVIDER: Ranelle Oyster, MD  END OF SESSION:  OT End of Session - 08/15/23 1148     Visit Number 11    Number of Visits 16   + evaluation   Date for OT Re-Evaluation 08/12/23    Authorization Type [employer]    Progress Note Due on Visit 16    OT Start Time 1400    OT Stop Time 1444    OT Time Calculation (min) 44 min    Equipment Utilized During Treatment --    Activity Tolerance Patient tolerated treatment well    Behavior During Therapy [facility] for tasks assessed/performed              Past Medical History:  Diagnosis Date   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/07/2020   Alcohol dependence (HCC)    Back pain    Marijuana dependence (HCC)    Tobacco dependence    Past Surgical History:  Procedure Laterality Date   BUBBLE STUDY  01/10/2020   Procedure: BUBBLE STUDY;  Surgeon: Sande Rives, MD;  Location: [facility] ENDOSCOPY;  Service: Cardiovascular;;   TEE WITHOUT CARDIOVERSION N/A 01/10/2020   Procedure: TRANSESOPHAGEAL ECHOCARDIOGRAM (TEE);  Surgeon: Sande Rives, MD;  Location: [facility] ENDOSCOPY;  Service: Cardiovascular;  Laterality: N/A;   Patient Active Problem List   Diagnosis Date Noted   Moderate major depression, single episode (HCC) 04/14/2022   Arteriosclerosis of carotid artery 02/01/2022   Essential hypertension 02/01/2022   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) 02/01/2022   History of substance abuse (HCC) 02/01/2022   Iron deficiency anemia 02/01/2022   Late effects of cerebrovascular disease 02/01/2022   Obesity 02/01/2022   Pure hypercholesterolemia 02/01/2022   Pain due to onychomycosis of toenails of both feet 06/10/2021   Neuropathic pain 04/09/2020   Pain    Spastic hemiparesis (HCC)     History of hypertension    Dyslipidemia    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   Leukocytosis 01/12/2020   Cerebrovascular accident (CVA) due to thrombosis of right middle cerebral artery (HCC)    Chronic pain syndrome    Hypokalemia    Dysphagia, post-stroke    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/07/2020   Tobacco dependence    Marijuana dependence (HCC)    Alcohol dependence (HCC)     ONSET DATE: referral: 05/03/2023  REFERRING DIAG: Y86.578 (ICD-10-CM) - Right middle cerebral artery stroke (HCC) G81.10 (ICD-10-CM) - Spastic hemiparesis (HCC)  DX: spasticity LUE s/p botox pecs and finger flexors on 02/09/23 - scheduled again next month (August) DX: CVA with spastic left hemiparesis RX: Eval and treat, resume therapies, bp better controlledDX: CVA with spastic left hemiparesis RX: Eval and treat, resume therapies, bp better controlled THERAPY DIAG:  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  Pt has not had Baclofen and Lyrica refilled at this time and is encouraged to discuss medications with MD as he as a different muscle relaxer still.  Pt accompanied by: self   PERTINENT HISTORY: Spastic L Hemiparesis.  PMH: R MCA CVA, Alcohol Dependence (occasional), Marijuana (NA)/Tobacco ([place]) Dependence, Back Pain  PRECAUTIONS: Other: no driving  WEIGHT BEARING RESTRICTIONS: No  PAIN:  Are you having pain? Yes: NPRS scale: 4-5/10 Pain location: L neck/upper trap/hip L  Pain description: aching  Aggravating factors: laying on it Relieving factors: Pain patch (doesn't have rx anymore) stretches, pain meds  FALLS: Has patient fallen  in last 6 months? No  LIVING ENVIRONMENT: Lives with:  roommate Lives in: House 8 steps to enter (B rails but uses R side rail going up), 1 level home Has following equipment at home: Quad cane small base, Wheelchair (manual), Shower bench, and bed side commode - over the toilet in the bathroom, urinal at night   PLOF: Independent prior to stroke 2021, on disability  PATIENT GOALS: Improve Lt arm mobility and balance  OBJECTIVE:   HAND DOMINANCE: Right  ADLs: Overall ADLs: Has aide M-Sun 2-4 hrs/day to assist with bathing, and some dressing, cooking and cleaning Transfers/ambulation related to ADLs: mod I w/ quad cane Eating: mod I eating, difficulty cutting food and occasional assist from aide Grooming: mod I brushing teeth and shaving UB Dressing: mod I for pull over shirt, assist for button up shirt LB Dressing: Aide assist getting pants over feet and assist for donning shoes and socks Toileting: mod I Bathing: max assist from aide Tub Shower transfers: min assist for leg management with tub bench Equipment: Transfer tub bench, Grab bars, and hand held shower  IADLs: Shopping: has groceries delivered [employer]: aide does Meal Prep: aide or roommate does but can get snack or heat up something in microwave after someone gets it out of the fridge etc for him Community mobility: relies on friends Medication management: independent s/p aide sorting it into his pill box Financial management: roommate does Handwriting:  denies change  MOBILITY STATUS:  walks with quad cane in house and short distances   UPPER EXTREMITY ROM:  RUE AROM WNL's  LUE: Limited active movement - sh shrug w/ slight elbow flex dominated by synergy pattern  Passive ROM Right eval Left eval  Shoulder flexion  90*  Shoulder abduction  80*  Shoulder adduction    Shoulder extension    Shoulder internal rotation    Shoulder external rotation    Elbow flexion  90%  Elbow extension  90%   Wrist flexion  90%  Wrist extension  25%  Wrist ulnar deviation    Wrist radial deviation    Wrist pronation    Wrist supination  90%  (Blank rows = not tested)  Passive finger extension full with wrist flexion. Difficulty at PIP joint extension, especially LF - at rest, PIP flexed, DIP hyperextended  HAND FUNCTION: No function LT HAND  COORDINATION: No function Lt hand  SENSATION: Significantly diminished - cannot detect location, entire L UE is dull feeling  EDEMA: minimal Lt hand  MUSCLE TONE: LUE: Moderate, Hypertonic, and Modifed Ashworth Scale 2 = More marked increase in muscle tone through most of the ROM, but affected part(s) easily moved  COGNITION: Overall cognitive status:  pt reports memory deficits  VISION: Subjective report: still reports that he need glasses for reading.  Said he was 20/30 last time he went to the eye doctor but no changes in vision from stroke  Visual history:  none  PERCEPTION: Not tested  PRAXIS: Not tested  OBSERVATIONS: Pleasant gentlemen with somewhat flat affect and  significant limitations with L UE ie) flickers of movement only noted although synergy patterns noted with yawning where L elbow flexed up to 90+ degrees.   TODAY'S TREATMENT:                                                                                                                               Self Care: OT educated patient again on sleep positioning as noted in patient instructions to reduce stress to upper/lower extremity nerves, which could be attributing to reported paresthesias and pain in affected extremities. Patient verbalized understanding. Handout provided and demonstration provided again re: position pillows between his legs as he has frequent hip pain complaints and to hug a pillow to support his hemiplegic arm/shoulder.  Neuromuscular Re-ed to prepare LUE for ROM.  Neuromuscular reeducation provided including specific visual, tactile and verbal cues and  assistance to stimulate the neuromuscular system and promote functional movement in LUE.  Reviewed and practiced motions he can perform with LUE support to move arm initially with support from OT and then with support of theraband loop to allow pt to feel/see movements without physical assist from OT but with gravity eliminated to help with motions ie) to push arm back and forth as able to with elbow/shoulder motions and even working with B hands on band to pull down softest yellow theraband.  - Seated Elbow Flexion Extension AAROM with Dowel into Wall  - 1 x daily - 10 reps  PATIENT EDUCATION: Education details: B UE ROM with gravity eliminated Person educat/ed: Patient Education method: Explanation, Demonstration, Tactile cues, and Verbal cues Education comprehension: verbalized understanding, returned demonstration, verbal cues required, tactile cues required, and needs further education  HOME EXERCISE PROGRAM: 07/01/23 - Shoulder table top slides & forearm supination Access Code: [email or web address] 07/20/23 - shoulder shrugs, PROM L UE wrist and digits - same access code 07/25/23 - elbow stretches and supine shoulder flexion - same access code 07/27/23 - elbow flexion/sh int rotation and towel squeeze - same access code 08/03/23 - Memory Strategies (WARM)  GOALS: Goals reviewed with patient? Yes  SHORT TERM GOALS: Target date:8/30//24  Independent with wear and care of resting hand splint for LUE up to daily. Baseline: 2-3x/week Goal status: MET 08/01/23 - RMI BendEase splint received 08/03/23 - Modifications to splint 08/08/23 - Most of night or couple of hours in the day  2.  Independent HEP for LUE self stretches  Baseline: None established a OT POC 3 months ago Goal status: MET  3.  Pt to verbalize understanding with memory compensation strategies ie) binder for info Baseline: Self reported memory issues and limited access to past HEPs, medication list etc. Goal status: IN PROGRESS   LONG  TERM GOALS: Target date: 08/12/23  Pt to verbalize understanding with potential A/E needs for one handed use to increase independence with ADLS (rocker knife, shoe buttons, LH sponge, etc)  Baseline: Dependent on caregiver assistance for many aspects of ADLs Goal  status: IN PROGRESS  2.  Pt to cut food I'ly with A/E Baseline: Dependent on caregiver assistance for many aspects of ADLs Goal status: IN PROGRESS  3.  Pt to consistently perform all UB dressing except buttons Baseline: Assisted by caregiver Goal status: MET  4.  Pt to perform LB dressing w/ no more than min assist with A/E prn (tie shoelaces) Baseline: Assisted by caregiver Goal status: MET 07/01/23 - patient is able to don/remove regular shoes (no splint) with laces fastened.    ASSESSMENT:  CLINICAL IMPRESSION: Patient is a 47 y.o. male who was seen today for occupational therapy treatment for L hemiplegia due to CVA. Patient currently presents with limited active use of LUE and HEP ideas continued to be explored to maximize comfort and stretch of digits (PIP jts) and wrist and AROM of elbow/shoulder.  Pt motivated to improve functional use of LUE and needs extensive carryover of HEP ideas for further progression. OT staff recommend trial use of NMES/E-stim.  This 10th progress note is for dates: 05/25/23 to 08/08/2023. Pt has met 4/4 STGs and 2/4 LTGs. Pt making progress towards goals as expected and continues to benefit from skilled OT services in the outpatient setting to work towards remaining goals or until max rehab potential is met.    PERFORMANCE DEFICITS: in functional skills including ADLs, IADLs, coordination, dexterity, sensation, tone, ROM, strength, pain, flexibility, Fine motor control, Gross motor control, mobility, body mechanics, decreased knowledge of use of DME, and UE functional use, cognitive skills including memory, and psychosocial skills including coping strategies.   IMPAIRMENTS: are limiting patient  from ADLs, IADLs, and rest and sleep.   CO-MORBIDITIES: may have co-morbidities  that affects occupational performance. Patient will benefit from skilled OT to address above impairments and improve overall function.  REHAB POTENTIAL: Good for established goals   PLAN:  OT FREQUENCY: 1x/week  OT DURATION: 4 weeks additional weeks   PLANNED INTERVENTIONS: self care/ADL training, therapeutic exercise, therapeutic activity, neuromuscular re-education, manual therapy, passive range of motion, functional mobility training, splinting, moist heat, patient/family education, cognitive remediation/compensation, coping strategies training, and DME and/or AE instructions  RECOMMENDED OTHER SERVICES: P.T evaluating patient today also.   CONSULTED AND AGREED WITH PLAN OF CARE: Patient  PLAN FOR NEXT SESSION:   NMES LUE ADL check - dressing UB Check fit of new BendEase resting hand splint Progress and print self PROM exercises for LUE. Continue SciFit for UEs - check on Active Hand options provided. Progress folder/binder for collection of info needed to monitor BP, list medications, collect AE info, combine HEP ideas and assist with Memory deficits  Victorino Sparrow, OT 08/15/2023, 5:16 PM

## 2023-08-15 NOTE — Patient Instructions (Addendum)
You can place your arms out to the sides with pillows underneath or place a pillow across your stomach with your hands resting on top for support. Hold a pillow with your arms away from your body.  Do not bend your arms at the elbows or tuck your hands under your head. Do not place your hand on top or underneath pillow. Use outside of pillow as a border.

## 2023-08-17 ENCOUNTER — Encounter: Payer: Self-pay | Admitting: Physical Therapy

## 2023-08-17 ENCOUNTER — Ambulatory Visit: Payer: Medicare HMO | Admitting: Physical Therapy

## 2023-08-17 VITALS — BP 130/88 | HR 74

## 2023-08-17 DIAGNOSIS — I69354 Hemiplegia and hemiparesis following cerebral infarction affecting left non-dominant side: Secondary | ICD-10-CM | POA: Diagnosis not present

## 2023-08-17 DIAGNOSIS — R293 Abnormal posture: Secondary | ICD-10-CM | POA: Diagnosis not present

## 2023-08-17 DIAGNOSIS — R2681 Unsteadiness on feet: Secondary | ICD-10-CM | POA: Diagnosis not present

## 2023-08-17 DIAGNOSIS — M79602 Pain in left arm: Secondary | ICD-10-CM | POA: Diagnosis not present

## 2023-08-17 DIAGNOSIS — R278 Other lack of coordination: Secondary | ICD-10-CM | POA: Diagnosis not present

## 2023-08-17 DIAGNOSIS — G8110 Spastic hemiplegia affecting unspecified side: Secondary | ICD-10-CM | POA: Diagnosis not present

## 2023-08-17 DIAGNOSIS — R2689 Other abnormalities of gait and mobility: Secondary | ICD-10-CM | POA: Diagnosis not present

## 2023-08-17 DIAGNOSIS — M6281 Muscle weakness (generalized): Secondary | ICD-10-CM

## 2023-08-17 DIAGNOSIS — M24542 Contracture, left hand: Secondary | ICD-10-CM | POA: Diagnosis not present

## 2023-08-17 NOTE — Therapy (Signed)
OUTPATIENT PHYSICAL THERAPY NEURO TREATMENT   Patient Name: Derek [name] MRN: [id number] DOB:12-31-1975, 47 y.o., male Today's Date: 08/17/2023   PCP: Aliene Beams, MD REFERRING PROVIDER: Ranelle Oyster, MD  END OF SESSION:  PT End of Session - 08/17/23 1419     Visit Number 9    Number of Visits 13   9 + 4   Date for PT Re-Evaluation 09/09/23   pushed out due to multi-D scheduling delay at re-cert on 40/9   Authorization Type [employer]    Progress Note Due on Visit 10    PT Start Time 1417   pt checked in at 212, needing restroom and to blow nose following retrieval from lobby   PT Stop Time 1443    PT Time Calculation (min) 26 min    Equipment Utilized During Treatment Gait belt    Activity Tolerance Patient tolerated treatment well    Behavior During Therapy [facility] for tasks assessed/performed            Past Medical History:  Diagnosis Date   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/07/2020   Alcohol dependence (HCC)    Back pain    Marijuana dependence (HCC)    Tobacco dependence    Past Surgical History:  Procedure Laterality Date   BUBBLE STUDY  01/10/2020   Procedure: BUBBLE STUDY;  Surgeon: Sande Rives, MD;  Location: [facility] ENDOSCOPY;  Service: Cardiovascular;;   TEE WITHOUT CARDIOVERSION N/A 01/10/2020   Procedure: TRANSESOPHAGEAL ECHOCARDIOGRAM (TEE);  Surgeon: Sande Rives, MD;  Location: [facility] ENDOSCOPY;  Service: Cardiovascular;  Laterality: N/A;   Patient Active Problem List   Diagnosis Date Noted   Moderate major depression, single episode (HCC) 04/14/2022   Arteriosclerosis of carotid artery 02/01/2022   Essential hypertension 02/01/2022   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) 02/01/2022   History of substance abuse (HCC) 02/01/2022   Iron deficiency anemia 02/01/2022   Late effects of cerebrovascular disease 02/01/2022   Obesity 02/01/2022   Pure hypercholesterolemia 02/01/2022    Pain due to onychomycosis of toenails of both feet 06/10/2021   Neuropathic pain 04/09/2020   Pain    Spastic hemiparesis (HCC)    History of hypertension    Dyslipidemia    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   Leukocytosis 01/12/2020   Cerebrovascular accident (CVA) due to thrombosis of right middle cerebral artery (HCC)    Chronic pain syndrome    Hypokalemia    Dysphagia, post-stroke    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/07/2020   Tobacco dependence    Marijuana dependence (HCC)    Alcohol dependence (HCC)     ONSET DATE: 2021 (CVA)  REFERRING DIAG: I63.511 (ICD-10-CM) - Right middle cerebral artery stroke (HCC) G81.10 (ICD-10-CM) - Spastic hemiparesis (HCC)  THERAPY DIAG:  Spastic hemiplegia of left nondominant 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 (HCC)  Abnormal posture  Muscle weakness  Other lack of coordination  Muscle weakness (generalized)  Other abnormalities of gait and mobility  Unsteadiness on feet  Rationale for Evaluation and Treatment: Rehabilitation  SUBJECTIVE:  SUBJECTIVE STATEMENT: Patient arrives late ambulating into gym using LBQC and increased LUE flexion pattern and forward trunk flexion compared to sessions prior.  He states he is in a lot of pain today.  He denies falls.  He cannot recall any exercises he is assigned for home when asked.  He also states he was not aware he was late.  He is unsure if the tape helped or not, thinks maybe it stayed on too long. Pt accompanied by: self  PERTINENT HISTORY: R MCA CVA, Alcohol Dependence, Marijuana/Tobacco Dependence, Back Pain   PAIN:  Are you having pain? Yes: NPRS scale: 7/10 Pain location: left shoulder and left hip Pain description: achy Aggravating factors: leaning on it,  varies as to which positions irritate it Relieving factors: pain patch  Today's Vitals   08/17/23 1417  BP: 130/88  Pulse: 74    PRECAUTIONS: Fall and Other: L spastic hemi  WEIGHT BEARING RESTRICTIONS: No  FALLS: Has patient fallen in last 6 months? No  LIVING ENVIRONMENT: Lives with:  roommate Lives in: House/apartment Stairs: Yes: External: 8 steps; bilateral but cannot reach both Has following equipment at home: Quad cane large base, Hemi walker, Wheelchair (manual), shower chair, and Grab bars  PLOF: Independent with household mobility with device, Needs assistance with ADLs, and Needs assistance with homemaking-aide comes in the mornings for 4 hours on weekdays and 2 hours on weekends  PATIENT GOALS: "to get some mobility on my left side"  "to be able to stand longer and walk easier"  OBJECTIVE:   DIAGNOSTIC FINDINGS: 01/09/20 head CT: IMPRESSION: 1. Evolving right cerebral infarct with evidence for hemorrhagic transformation at the right basal ganglia, similar in appearance and distribution as compared to previous MRI. Associated mass effect with 3 mm of right-to-left shift. No hydrocephalus or ventricular trapping. 2. No other new acute intracranial abnormality.  COGNITION: Overall cognitive status: Within functional limits for tasks assessed   SENSATION: WFL  COORDINATION: LE RAMS:  limited by lack of DF AROM on L Heel-to-shin:  can cross left to right, but cannot bring heel directly to shin   MUSCLE TONE: LLE: Hypertonic Clonus present  POSTURE: rounded shoulders, forward head, increased thoracic kyphosis, posterior pelvic tilt, left pelvic obliquity, and weight shift right  LOWER EXTREMITY MMT:    MMT Right Eval Left Eval  Hip flexion 5 2-  Hip extension    Hip abduction 5 3  Hip adduction 5 3  Hip internal rotation    Hip external rotation    Knee flexion 4 2+/5  Knee extension 5 3 w/ increased time he can achieve full extension  Ankle  dorsiflexion 5 0  Ankle plantarflexion    Ankle inversion    Ankle eversion    (Blank rows = not tested)  BED MOBILITY:  Independent per patient report - he reports his bed is lower which helps  TRANSFERS: Assistive device utilized: Quad cane large base  Sit to stand: SBA Stand to sit: SBA Chair to chair: SBA  GAIT: Gait pattern: step to pattern, decreased arm swing- Left, decreased step length- Right, decreased stance time- Left, decreased hip/knee flexion- Left, decreased ankle dorsiflexion- Left, circumduction- Left, Left hip hike, lateral lean- Right, trunk rotated posterior- Left, and poor foot clearance- Left Distance walked: various clinic distances Assistive device utilized: Quad cane large base Level of assistance: SBA Comments:  LUE held in flexion and IR and LLE lacking heel strike due to PF tone.  Gentle L knee flexion maintained in stance  due to spasticity.  FUNCTIONAL TESTS:  5 times sit to stand: 18.53 seconds w/ RUE support and right lateral lean Timed up and go (TUG): 22.69 seconds w/ CGA and LBQC Berg Balance Scale: To be finished next session.   PATIENT SURVEYS:  FOTO not captured due to chronicity of CVA.  TODAY'S TREATMENT:    RUE in sitting prior to session:                                                                                                                           Today's Vitals   08/17/23 1417  BP: 130/88  Pulse: 74   -Extensive discussion of compliance to home program and stretching to improve pain and posture as pt is doing nothing at home at current and showing up late to appts.  He appears withdrawn during conversation only saying okay intermittently.  PT informs pt of plan to discharge at end of remaining 3 visits due to limited progress and ongoing challenges.  -Supine bridges x10, pt has great difficulty clearing mat -Supine march x20 -Supine heel slide LLE x15 using towel for frictionless surface, pt has difficulty maintaining  heel on surface -Seated heel slide LLE x15, pt has no motion passed neutral -Hamstring stretch x30 seconds, pt requires assistance correcting form to improve knee position with cues to use trunk lean onto LLE -Seated gastroc stretch w/ strap LLE x1 minute, pt attempts to adjust strap to pull on toes only - edu against this to improve ankle mobility vs toe extension -Staggered STS x5 LLE in rear  Pt denies needing reprint of current copy.  PATIENT EDUCATION: Education details: Monitor BP as able.  Continue HEP.   Reminder of next appt with emphasis on needing to be on time to maximize time in therapy and potential benefit.  Emphasized compliance to home program to help manage pain and improve deficits as able over time.   Person educated: Patient Education method: Explanation Education comprehension: verbalized understanding and needs further education  HOME EXERCISE PROGRAM: [id number] - added SLS and tandem at counter on 07/20/2023  GOALS: Goals reviewed with patient? Yes  SHORT TERM GOALS: Target date: 06/24/2023  Pt will be independent and compliant with strengthening, stretching, and balance focused HEP in order to maintain functional progress and improve mobility. Baseline:  Reviewed (9/4) Goal status: IN PROGRESS  2.  Pt will decrease 5xSTS to </=15.53 seconds using RUE support as needed with improved midline in order to demonstrate decreased risk for falls and improved functional bilateral LE strength and power. Baseline: 18.53 sec w/ RUE support and right weight shift; 19.28 sec w/ RUE support and right weight shift (9/4) Goal status: NOT MET  3.  Pt will demonstrate TUG of </=18.69 seconds at SBA level in order to decrease risk of falls and improve functional mobility using LRAD. Baseline: 22.69 sec CGA w/ LBQC; 23.81 sec SBA w/ LBQC (9/4) Goal status: NOT MET  LONG TERM GOALS: Target date: 07/22/2023 (  Note:  due to scheduling, likely assess single goal at LTG and update date  for last visit at 10 week mark)  Pt will decrease 5xSTS to </=12.53 seconds using RUE support as needed with improved midline in order to demonstrate decreased risk for falls and improved functional bilateral LE strength and power. Baseline: 18.53 sec w/ RUE support and right weight shift; 14.78 sec w/ RUE support and right weight shift (10/2) Goal status: PARTIALLY MET  2.  Pt will demonstrate TUG of </=14.69 seconds at SBA level in order to decrease risk of falls and improve functional mobility using LRAD. Baseline: 22.69 sec CGA w/ LBQC Goal status: INITIAL  3.  Pt will increase BERG balance score to at least 41/56 to demonstrate improved static balance. Baseline: 36/56 (8/30); 39/56 (10/2) Goal status: PARTIALLY MET  4.  Pt will ambulate >/=250 feet with LBQC and no more than SBA w/o increased shoulder pain and improved left LE control to promote household and community access.  Baseline:  L shoulder pain 4-5/10, left knee flexion in stance; pt has increased LLE tone present during all ambulation today and baseline left shoulder pain 3/10 (10/2)  Goal status:  NOT MET  GOALS (for re-cert): Goals reviewed with patient? Yes  SHORT TERM GOALS = LONG TERM GOALS: Target date: 09/02/2023  Pt will decrease 5xSTS to </=10.53 seconds using RUE support as needed with improved midline in order to demonstrate decreased risk for falls and improved functional bilateral LE strength and power. Baseline: 18.53 sec w/ RUE support and right weight shift; 14.78 sec w/ RUE support and right weight shift (10/2) Goal status: REVISED  2.  Pt will demonstrate TUG of </=14.69 seconds at SBA level in order to decrease risk of falls and improve functional mobility using LRAD. Baseline: 22.69 sec CGA w/ LBQC Goal status: INITIAL  3.  Pt will increase BERG balance score to at least 43/56 to demonstrate improved static balance. Baseline: 36/56 (8/30); 39/56 (10/2) Goal status: REVISED  ASSESSMENT:  CLINICAL  IMPRESSION: Patient is non-compliant with established home program and continues to demonstrate poor carryover from session-to-session.  Ongoing safety concerns due to chronic deficits, but PT feels patient is close to maximized potential in this setting.  Will plan to discharge at end of current POC.  OBJECTIVE IMPAIRMENTS: Abnormal gait, decreased activity tolerance, decreased balance, decreased coordination, decreased endurance, decreased knowledge of condition, decreased knowledge of use of DME, difficulty walking, decreased strength, impaired perceived functional ability, impaired tone, impaired UE functional use, postural dysfunction, and pain.   ACTIVITY LIMITATIONS: carrying, lifting, bending, standing, squatting, stairs, bathing, dressing, hygiene/grooming, locomotion level, and caring for others  PARTICIPATION LIMITATIONS: meal prep, cleaning, interpersonal relationship, driving, shopping, community activity, and occupation  PERSONAL FACTORS: Behavior pattern, Fitness, Past/current experiences, Time since onset of injury/illness/exacerbation, Transportation, and 3+ comorbidities: tobacco dependence, HTN, ETOH, chronic back pain  are also affecting patient's functional outcome.   REHAB POTENTIAL: [place] PMH and personal factors  CLINICAL DECISION MAKING: Evolving/moderate complexity  EVALUATION COMPLEXITY: Moderate  PLAN:  PT FREQUENCY: 1x/week  PT DURATION: 10 weeks + 4 weeks (re-cert)  PLANNED INTERVENTIONS: Therapeutic exercises, Therapeutic activity, Neuromuscular re-education, Balance training, Gait training, Patient/Family education, Self Care, Joint mobilization, Stair training, Vestibular training, Visual/preceptual remediation/compensation, Orthotic/Fit training, DME instructions, Aquatic Therapy, Electrical stimulation, Taping, Manual therapy, and Re-evaluation  PLAN FOR NEXT SESSION: ASSESS R BP!  Modify HEP prn for LLE strengthening, static and dynamic balance,  spasticity management (stretching - review HEP stretching and modify  as needed!).  Gait training/pre-gait - pt uses LBQC, left quad control - step taps/overs  Sadie Haber, PT, DPT 08/17/2023, 2:43 PM

## 2023-08-23 ENCOUNTER — Ambulatory Visit: Payer: Medicare HMO | Admitting: Occupational Therapy

## 2023-08-23 DIAGNOSIS — M6281 Muscle weakness (generalized): Secondary | ICD-10-CM

## 2023-08-23 DIAGNOSIS — R293 Abnormal posture: Secondary | ICD-10-CM | POA: Diagnosis not present

## 2023-08-23 DIAGNOSIS — M24542 Contracture, left hand: Secondary | ICD-10-CM

## 2023-08-23 DIAGNOSIS — I69354 Hemiplegia and hemiparesis following cerebral infarction affecting left non-dominant side: Secondary | ICD-10-CM | POA: Diagnosis not present

## 2023-08-23 DIAGNOSIS — M79602 Pain in left arm: Secondary | ICD-10-CM

## 2023-08-23 DIAGNOSIS — G8110 Spastic hemiplegia affecting unspecified side: Secondary | ICD-10-CM | POA: Diagnosis not present

## 2023-08-23 DIAGNOSIS — R2681 Unsteadiness on feet: Secondary | ICD-10-CM | POA: Diagnosis not present

## 2023-08-23 DIAGNOSIS — R278 Other lack of coordination: Secondary | ICD-10-CM | POA: Diagnosis not present

## 2023-08-23 DIAGNOSIS — R2689 Other abnormalities of gait and mobility: Secondary | ICD-10-CM | POA: Diagnosis not present

## 2023-08-23 NOTE — Therapy (Signed)
OUTPATIENT OCCUPATIONAL THERAPY NEURO TREATMENT   Patient Name: Derek [name] MRN: [id number] DOB:1976/07/26, 47 y.o., male 66 Date: 08/23/2023  PCP: Aliene Beams, MD REFERRING PROVIDER: Ranelle Oyster, MD  END OF SESSION:  OT End of Session - 08/23/23 1450     Visit Number 12    Number of Visits 16   + evaluation   Date for OT Re-Evaluation 08/12/23    Authorization Type [employer]IC[employer]    Progress Note Due on Visit 16    OT Start Time 1447    OT Stop Time 1530    OT Time Calculation (min) 43 min    Activity Tolerance Patient tolerated treatment well    Behavior During Therapy [facility] for tasks assessed/performed              Past Medical History:  Diagnosis Date   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/07/2020   Alcohol dependence (HCC)    Back pain    Marijuana dependence (HCC)    Tobacco dependence    Past Surgical History:  Procedure Laterality Date   BUBBLE STUDY  01/10/2020   Procedure: BUBBLE STUDY;  Surgeon: Sande Rives, MD;  Location: [facility] ENDOSCOPY;  Service: Cardiovascular;;   TEE WITHOUT CARDIOVERSION N/A 01/10/2020   Procedure: TRANSESOPHAGEAL ECHOCARDIOGRAM (TEE);  Surgeon: Sande Rives, MD;  Location: [facility] ENDOSCOPY;  Service: Cardiovascular;  Laterality: N/A;   Patient Active Problem List   Diagnosis Date Noted   Moderate major depression, single episode (HCC) 04/14/2022   Arteriosclerosis of carotid artery 02/01/2022   Essential hypertension 02/01/2022   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) 02/01/2022   History of substance abuse (HCC) 02/01/2022   Iron deficiency anemia 02/01/2022   Late effects of cerebrovascular disease 02/01/2022   Obesity 02/01/2022   Pure hypercholesterolemia 02/01/2022   Pain due to onychomycosis of toenails of both feet 06/10/2021   Neuropathic pain 04/09/2020   Pain    Spastic hemiparesis (HCC)    History of hypertension    Dyslipidemia     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   Leukocytosis 01/12/2020   Cerebrovascular accident (CVA) due to thrombosis of right middle cerebral artery (HCC)    Chronic pain syndrome    Hypokalemia    Dysphagia, post-stroke    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/07/2020   Tobacco dependence    Marijuana dependence (HCC)    Alcohol dependence (HCC)     ONSET DATE: referral: 05/03/2023  REFERRING DIAG: W09.811 (ICD-10-CM) - Right middle cerebral artery stroke (HCC) G81.10 (ICD-10-CM) - Spastic hemiparesis (HCC)  DX: spasticity LUE s/p botox pecs and finger flexors on 02/09/23 - scheduled again next month (August) DX: CVA with spastic left hemiparesis RX: Eval and treat, resume therapies, bp better controlledDX: CVA with spastic left hemiparesis RX: Eval and treat, resume therapies, bp better controlled THERAPY DIAG:  Muscle weakness  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  Pt reported he got his medication refilled and now has Baclofen and Lyrica etc at this time.  Med list updated as reviewed with pt.   Pt also mentioned he was supposed to get some sort of robotic arm but he never finished the process  to get it.  OTR able to look into his chart to find that he was being considered for Myomo.  Pt accompanied by: self   PERTINENT HISTORY: Spastic L Hemiparesis.  PMH: R MCA CVA, Alcohol Dependence (occasional), Marijuana (NA)/Tobacco ([place]) Dependence, Back Pain  PRECAUTIONS: Other: no driving  WEIGHT BEARING RESTRICTIONS: No  PAIN:  Are you having pain? Yes: NPRS scale: 4-5/10 Pain location: L neck/upper trap/hip L  Pain description: aching  Aggravating factors: laying on it Relieving  factors: Pain patch (doesn't have rx anymore) stretches, pain meds  FALLS: Has patient fallen in last 6 months? No  LIVING ENVIRONMENT: Lives with:  roommate Lives in: House 8 steps to enter (B rails but uses R side rail going up), 1 level home Has following equipment at home: Quad cane small base, Wheelchair (manual), Shower bench, and bed side commode - over the toilet in the bathroom, urinal at night   PLOF: Independent prior to stroke 2021, on disability  PATIENT GOALS: Improve Lt arm mobility and balance  OBJECTIVE:   HAND DOMINANCE: Right  ADLs: Overall ADLs: Has aide M-Sun 2-4 hrs/day to assist with bathing, and some dressing, cooking and cleaning Transfers/ambulation related to ADLs: mod I w/ quad cane Eating: mod I eating, difficulty cutting food and occasional assist from aide Grooming: mod I brushing teeth and shaving UB Dressing: mod I for pull over shirt, assist for button up shirt LB Dressing: Aide assist getting pants over feet and assist for donning shoes and socks Toileting: mod I Bathing: max assist from aide Tub Shower transfers: min assist for leg management with tub bench Equipment: Transfer tub bench, Grab bars, and hand held shower  IADLs: Shopping: has groceries delivered [employer]: aide does Meal Prep: aide or roommate does but can get snack or heat up something in microwave after someone gets it out of the fridge etc for him Community mobility: relies on friends Medication management: independent s/p aide sorting it into his pill box Financial management: roommate does Handwriting:  denies change  MOBILITY STATUS:  walks with quad cane in house and short distances   UPPER EXTREMITY ROM:  RUE AROM WNL's  LUE: Limited active movement - sh shrug w/ slight elbow flex dominated by synergy pattern  Passive ROM Right eval Left eval  Shoulder flexion  90*  Shoulder abduction  80*  Shoulder adduction    Shoulder extension    Shoulder  internal rotation    Shoulder external rotation    Elbow flexion  90%  Elbow extension  90%  Wrist flexion  90%  Wrist extension  25%  Wrist ulnar deviation    Wrist radial deviation    Wrist pronation    Wrist supination  90%  (Blank rows = not tested)  Passive finger extension full with wrist flexion. Difficulty at PIP joint extension, especially LF - at rest, PIP flexed, DIP hyperextended  HAND FUNCTION: No function LT HAND  COORDINATION: No function Lt hand  SENSATION: Significantly diminished - cannot detect location, entire L UE is dull feeling  EDEMA: minimal Lt hand  MUSCLE TONE: LUE: Moderate, Hypertonic, and Modifed Ashworth Scale 2 = More marked increase in muscle tone through most of the ROM, but affected part(s) easily moved  COGNITION: Overall cognitive status:  pt reports memory deficits  VISION: Subjective report: still reports that he need glasses for reading.  Said he was 20/30 last time he went to the eye doctor but no changes in vision from stroke  Visual history:  none  PERCEPTION: Not tested  PRAXIS: Not tested  OBSERVATIONS: Pleasant gentlemen with somewhat flat affect and significant limitations with L UE ie) flickers of movement only noted although synergy patterns noted with yawning where L elbow flexed up to 90+ degrees.   TODAY'S TREATMENT:                                                                                                                               Neuromuscular Re-ed continued to help facilitate AROM of LUE.  Neuromuscular reeducation provided including ongoing visual, tactile and verbal cues and physical assistance as needed to stimulate the neuromuscular system and promote functional movement in LUE including elbow flexion and grasp/movement of L UE to hold, stack and manipulate cones.  Elbow flexion did not elicit yawning today like it did last week and pt was performing elbow flexion more on his own today.  LUE initially  supported by OTR but as motion was practiced, he was able to perform motion himself ie) lift, move hand past cone on his R, slide hand down the cone, grasp it and pick it up to move it out to the L side.  He does use use compensatory trunk motions to help get his arm in position but he is able to see and feel progression of LUE active movements without physical assist from OT. OTR suggested he consider easy to grasp objects with some stability ie) PVC pipe, marker, cone etc to practice at home.   PATIENT EDUCATION: Education details: AROM activities with LUE Person educat/ed: Patient Education method: Explanation, Demonstration, Tactile cues, and Verbal cues Education comprehension: verbalized understanding, returned demonstration, verbal cues required, tactile cues required, and needs further education  HOME EXERCISE PROGRAM: 07/01/23 - Shoulder table top slides & forearm supination Access Code: [email or web address] 07/20/23 - shoulder shrugs, PROM L UE wrist and digits - same access code 07/25/23 - elbow stretches and supine shoulder flexion - same access code 07/27/23 - elbow flexion/sh int rotation and towel squeeze - same access code 08/03/23 - Memory Strategies (WARM) 08/15/23 - Sleep positions; elbow flex/ext with cane - same access code  GOALS: Goals reviewed with patient? Yes  SHORT TERM GOALS: Target date:8/30//24  Independent with wear and care of resting hand splint for LUE up to daily. Baseline: 2-3x/week Goal status: MET 08/01/23 - RMI BendEase splint received 08/03/23 - Modifications to splint 08/08/23 - Most of night or couple of hours in the day  2.  Independent HEP for LUE self stretches  Baseline: None established a OT POC 3 months ago Goal status: MET  3.  Pt to verbalize understanding with memory compensation strategies ie) binder for info Baseline: Self reported memory issues and limited access to past HEPs, medication list etc. Goal status: IN PROGRESS   LONG TERM GOALS:  Target date: 08/12/23  Pt to verbalize understanding with potential A/E needs for one handed use to increase independence with ADLS (rocker  knife, shoe buttons, LH sponge, etc)  Baseline: Dependent on caregiver assistance for many aspects of ADLs Goal status: IN PROGRESS  2.  Pt to cut food I'ly with A/E Baseline: Dependent on caregiver assistance for many aspects of ADLs Goal status: IN PROGRESS  3.  Pt to consistently perform all UB dressing except buttons Baseline: Assisted by caregiver Goal status: MET  4.  Pt to perform LB dressing w/ no more than min assist with A/E prn (tie shoelaces) Baseline: Assisted by caregiver Goal status: MET 07/01/23 - patient is able to don/remove regular shoes (no splint) with laces fastened.    ASSESSMENT:  CLINICAL IMPRESSION: Patient seen today for occupational therapy treatment for L hemiplegia due to CVA. Patient has been showing increased active ROM of LUE with ability to pick up and move medium cones without constant support form OTR.  Ideas provided for progression of HEP ideas to maximize AROM and comfort of L elbow/shoulder.  Pt motivated to improve functional use of LUE and may actually motivate Myomo to help with carryover of HEP ideas for further progression.   PERFORMANCE DEFICITS: in functional skills including ADLs, IADLs, coordination, dexterity, sensation, tone, ROM, strength, pain, flexibility, Fine motor control, Gross motor control, mobility, body mechanics, decreased knowledge of use of DME, and UE functional use, cognitive skills including memory, and psychosocial skills including coping strategies.   IMPAIRMENTS: are limiting patient from ADLs, IADLs, and rest and sleep.   CO-MORBIDITIES: may have co-morbidities  that affects occupational performance. Patient will benefit from skilled OT to address above impairments and improve overall function.  REHAB POTENTIAL: Good for established goals   PLAN:  OT FREQUENCY:  1x/week  OT DURATION: 4 weeks additional weeks   PLANNED INTERVENTIONS: self care/ADL training, therapeutic exercise, therapeutic activity, neuromuscular re-education, manual therapy, passive range of motion, functional mobility training, splinting, moist heat, patient/family education, cognitive remediation/compensation, coping strategies training, and DME and/or AE instructions  RECOMMENDED OTHER SERVICES: P.T seeing patient also.   CONSULTED AND AGREED WITH PLAN OF CARE: Patient  PLAN FOR NEXT SESSION:   NMES LUE Myomo Rx/option ADL check - dressing UB Progress and print self PROM exercises for LUE. Continue Sci[employer] for UEs - check on Active Hand options provided. Progress folder/binder for collection of info needed to monitor BP, list medications, collect AE info, combine HEP ideas and assist with Memory deficits  Victorino Sparrow, OT 08/23/2023, 3:44 PM

## 2023-08-25 ENCOUNTER — Ambulatory Visit: Payer: Medicare HMO | Admitting: Physical Therapy

## 2023-08-30 ENCOUNTER — Ambulatory Visit: Payer: Medicare HMO | Admitting: Physical Therapy

## 2023-08-30 ENCOUNTER — Ambulatory Visit: Payer: Medicare HMO | Admitting: Occupational Therapy

## 2023-08-30 ENCOUNTER — Encounter: Payer: Self-pay | Admitting: Physical Therapy

## 2023-08-30 VITALS — BP 127/92 | HR 56

## 2023-08-30 DIAGNOSIS — R293 Abnormal posture: Secondary | ICD-10-CM | POA: Diagnosis not present

## 2023-08-30 DIAGNOSIS — M6281 Muscle weakness (generalized): Secondary | ICD-10-CM | POA: Diagnosis not present

## 2023-08-30 DIAGNOSIS — M24542 Contracture, left hand: Secondary | ICD-10-CM | POA: Diagnosis not present

## 2023-08-30 DIAGNOSIS — R2689 Other abnormalities of gait and mobility: Secondary | ICD-10-CM | POA: Diagnosis not present

## 2023-08-30 DIAGNOSIS — R278 Other lack of coordination: Secondary | ICD-10-CM

## 2023-08-30 DIAGNOSIS — M79602 Pain in left arm: Secondary | ICD-10-CM | POA: Diagnosis not present

## 2023-08-30 DIAGNOSIS — G8110 Spastic hemiplegia affecting unspecified side: Secondary | ICD-10-CM

## 2023-08-30 DIAGNOSIS — I69354 Hemiplegia and hemiparesis following cerebral infarction affecting left non-dominant side: Secondary | ICD-10-CM

## 2023-08-30 DIAGNOSIS — R2681 Unsteadiness on feet: Secondary | ICD-10-CM

## 2023-08-30 NOTE — Therapy (Signed)
OUTPATIENT PHYSICAL THERAPY NEURO TREATMENT   Patient Name: Derek [name] MRN: [id number] DOB:04/02/1976, 47 y.o., male Today's Date: 08/30/2023   PCP: Aliene Beams, MD REFERRING PROVIDER: Ranelle Oyster, MD  END OF SESSION:  PT End of Session - 08/30/23 1411     Visit Number 10    Number of Visits 13   9 + 4   Date for PT Re-Evaluation 09/09/23   pushed out due to multi-D scheduling delay at re-cert on 02/7   Authorization Type [employer]IC[employer]    Progress Note Due on Visit 10    PT Start Time 1403   pt walks to rest room mod I at onset of session x11 minutes   PT Stop Time 1445    PT Time Calculation (min) 42 min    Equipment Utilized During Treatment Gait belt    Activity Tolerance Patient tolerated treatment well    Behavior During Therapy [facility] for tasks assessed/performed            Past Medical History:  Diagnosis Date   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/07/2020   Alcohol dependence (HCC)    Back pain    Marijuana dependence (HCC)    Tobacco dependence    Past Surgical History:  Procedure Laterality Date   BUBBLE STUDY  01/10/2020   Procedure: BUBBLE STUDY;  Surgeon: Sande Rives, MD;  Location: [facility] ENDOSCOPY;  Service: Cardiovascular;;   TEE WITHOUT CARDIOVERSION N/A 01/10/2020   Procedure: TRANSESOPHAGEAL ECHOCARDIOGRAM (TEE);  Surgeon: Sande Rives, MD;  Location: [facility] ENDOSCOPY;  Service: Cardiovascular;  Laterality: N/A;   Patient Active Problem List   Diagnosis Date Noted   Moderate major depression, single episode (HCC) 04/14/2022   Arteriosclerosis of carotid artery 02/01/2022   Essential hypertension 02/01/2022   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) 02/01/2022   History of substance abuse (HCC) 02/01/2022   Iron deficiency anemia 02/01/2022   Late effects of cerebrovascular disease 02/01/2022   Obesity 02/01/2022   Pure hypercholesterolemia 02/01/2022   Pain due to onychomycosis  of toenails of both feet 06/10/2021   Neuropathic pain 04/09/2020   Pain    Spastic hemiparesis (HCC)    History of hypertension    Dyslipidemia    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   Leukocytosis 01/12/2020   Cerebrovascular accident (CVA) due to thrombosis of right middle cerebral artery (HCC)    Chronic pain syndrome    Hypokalemia    Dysphagia, post-stroke    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/07/2020   Tobacco dependence    Marijuana dependence (HCC)    Alcohol dependence (HCC)     ONSET DATE: 2021 (CVA)  REFERRING DIAG: I63.511 (ICD-10-CM) - Right middle cerebral artery stroke (HCC) G81.10 (ICD-10-CM) - Spastic hemiparesis (HCC)  THERAPY DIAG:  Muscle weakness  Spastic hemiplegia of left nondominant 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 (HCC)  Abnormal posture  Other lack of coordination  Muscle weakness (generalized)  Other abnormalities of gait and mobility  Unsteadiness on feet  Rationale for Evaluation and Treatment: Rehabilitation  SUBJECTIVE:  SUBJECTIVE STATEMENT: Patient received on mat table following OT, he brought [facility] to session.  He denies falls.   Pt accompanied by: self  PERTINENT HISTORY: R MCA CVA, Alcohol Dependence, Marijuana/Tobacco Dependence, Back Pain   PAIN:  Are you having pain? Yes: NPRS scale: 2/10 Pain location: left shoulder and left hip Pain description: achy Aggravating factors: leaning on it, varies as to which positions irritate it Relieving factors: pain patch RUE in sitting prior to interventions:   Today's Vitals   08/30/23 1410  BP: (!) 127/92  Pulse: (!) 56  *Pt requests to ambulate to restroom, gone x 11 minutes following BP.  PRECAUTIONS: Fall and Other: L spastic hemi  WEIGHT BEARING  RESTRICTIONS: No  FALLS: Has patient fallen in last 6 months? No  LIVING ENVIRONMENT: Lives with:  roommate Lives in: House/apartment Stairs: Yes: External: 8 steps; bilateral but cannot reach both Has following equipment at home: Quad cane large base, Hemi walker, Wheelchair (manual), shower chair, and Grab bars  PLOF: Independent with household mobility with device, Needs assistance with ADLs, and Needs assistance with homemaking-aide comes in the mornings for 4 hours on weekdays and 2 hours on weekends  PATIENT GOALS: "to get some mobility on my left side"  "to be able to stand longer and walk easier"  OBJECTIVE:   DIAGNOSTIC FINDINGS: 01/09/20 head CT: IMPRESSION: 1. Evolving right cerebral infarct with evidence for hemorrhagic transformation at the right basal ganglia, similar in appearance and distribution as compared to previous MRI. Associated mass effect with 3 mm of right-to-left shift. No hydrocephalus or ventricular trapping. 2. No other new acute intracranial abnormality.  COGNITION: Overall cognitive status: Within functional limits for tasks assessed   SENSATION: WFL  COORDINATION: LE RAMS:  limited by lack of DF AROM on L Heel-to-shin:  can cross left to right, but cannot bring heel directly to shin   MUSCLE TONE: LLE: Hypertonic Clonus present  POSTURE: rounded shoulders, forward head, increased thoracic kyphosis, posterior pelvic tilt, left pelvic obliquity, and weight shift right  LOWER EXTREMITY MMT:    MMT Right Eval Left Eval  Hip flexion 5 2-  Hip extension    Hip abduction 5 3  Hip adduction 5 3  Hip internal rotation    Hip external rotation    Knee flexion 4 2+/5  Knee extension 5 3 w/ increased time he can achieve full extension  Ankle dorsiflexion 5 0  Ankle plantarflexion    Ankle inversion    Ankle eversion    (Blank rows = not tested)  BED MOBILITY:  Independent per patient report - he reports his bed is lower which  helps  TRANSFERS: Assistive device utilized: Quad cane large base  Sit to stand: SBA Stand to sit: SBA Chair to chair: SBA  GAIT: Gait pattern: step to pattern, decreased arm swing- Left, decreased step length- Right, decreased stance time- Left, decreased hip/knee flexion- Left, decreased ankle dorsiflexion- Left, circumduction- Left, Left hip hike, lateral lean- Right, trunk rotated posterior- Left, and poor foot clearance- Left Distance walked: various clinic distances Assistive device utilized: Quad cane large base Level of assistance: SBA Comments:  LUE held in flexion and IR and LLE lacking heel strike due to PF tone.  Gentle L knee flexion maintained in stance due to spasticity.  FUNCTIONAL TESTS:  5 times sit to stand: 18.53 seconds w/ RUE support and right lateral lean Timed up and go (TUG): 22.69 seconds w/ CGA and LBQC Berg Balance Scale: To be finished next  session.   PATIENT SURVEYS:  FOTO not captured due to chronicity of CVA.  TODAY'S TREATMENT:    RUE in sitting prior to session:                                                                                                                           Today's Vitals   08/30/23 1410  BP: (!) 127/92  Pulse: (!) 56   PT provides passive LLE stretching in supine:  IR/ER, piriformis, hamstring, PF, toe extension, knee flexion, hip flexion  2 rounds each of lateral step up and overs and forward up and overs using 4" aerobic step and RUE support  PATIENT EDUCATION: Education details: Monitor BP as able.  Continue HEP - discussed ways to modify stretches for improved self-management.   Continued encouraging compliance to home program to help manage pain and improve deficits as able over time.  Informed of tentative plan to discharge at next session as discussed at prior session - will discuss ongoing POC and prognostic factors with OT. Person educated: Patient Education method: Explanation Education comprehension:  verbalized understanding and needs further education  HOME EXERCISE PROGRAM: [id number] - added SLS and tandem at counter on 07/20/2023  GOALS: Goals reviewed with patient? Yes  SHORT TERM GOALS: Target date: 06/24/2023  Pt will be independent and compliant with strengthening, stretching, and balance focused HEP in order to maintain functional progress and improve mobility. Baseline:  Reviewed (9/4) Goal status: IN PROGRESS  2.  Pt will decrease 5xSTS to </=15.53 seconds using RUE support as needed with improved midline in order to demonstrate decreased risk for falls and improved functional bilateral LE strength and power. Baseline: 18.53 sec w/ RUE support and right weight shift; 19.28 sec w/ RUE support and right weight shift (9/4) Goal status: NOT MET  3.  Pt will demonstrate TUG of </=18.69 seconds at SBA level in order to decrease risk of falls and improve functional mobility using LRAD. Baseline: 22.69 sec CGA w/ LBQC; 23.81 sec SBA w/ LBQC (9/4) Goal status: NOT MET  LONG TERM GOALS: Target date: 07/22/2023 (Note:  due to scheduling, likely assess single goal at LTG and update date for last visit at 10 week mark)  Pt will decrease 5xSTS to </=12.53 seconds using RUE support as needed with improved midline in order to demonstrate decreased risk for falls and improved functional bilateral LE strength and power. Baseline: 18.53 sec w/ RUE support and right weight shift; 14.78 sec w/ RUE support and right weight shift (10/2) Goal status: PARTIALLY MET  2.  Pt will demonstrate TUG of </=14.69 seconds at SBA level in order to decrease risk of falls and improve functional mobility using LRAD. Baseline: 22.69 sec CGA w/ LBQC Goal status: INITIAL  3.  Pt will increase BERG balance score to at least 41/56 to demonstrate improved static balance. Baseline: 36/56 (8/30); 39/56 (10/2) Goal status: PARTIALLY MET  4.  Pt will ambulate >/=250 feet with LBQC and  no more than SBA w/o increased  shoulder pain and improved left LE control to promote household and community access.  Baseline:  L shoulder pain 4-5/10, left knee flexion in stance; pt has increased LLE tone present during all ambulation today and baseline left shoulder pain 3/10 (10/2)  Goal status:  NOT MET  GOALS (for re-cert): Goals reviewed with patient? Yes  SHORT TERM GOALS = LONG TERM GOALS: Target date: 09/02/2023  Pt will decrease 5xSTS to </=10.53 seconds using RUE support as needed with improved midline in order to demonstrate decreased risk for falls and improved functional bilateral LE strength and power. Baseline: 18.53 sec w/ RUE support and right weight shift; 14.78 sec w/ RUE support and right weight shift (10/2) Goal status: REVISED  2.  Pt will demonstrate TUG of </=14.69 seconds at SBA level in order to decrease risk of falls and improve functional mobility using LRAD. Baseline: 22.69 sec CGA w/ LBQC Goal status: INITIAL  3.  Pt will increase BERG balance score to at least 43/56 to demonstrate improved static balance. Baseline: 36/56 (8/30); 39/56 (10/2) Goal status: REVISED  ASSESSMENT:  CLINICAL IMPRESSION: Patient in restroom for prolonged period during session limiting tasks accomplished.  PT provided PROM to stretch hemiparetic lower limb as pt is not doing this much at home.  Further completed NMR to hemiparetic LE for improved quad control and obstacle management.  PT still likely to discharge next session due to anticipated poor objective progress on goals and decreased carryover.  OBJECTIVE IMPAIRMENTS: Abnormal gait, decreased activity tolerance, decreased balance, decreased coordination, decreased endurance, decreased knowledge of condition, decreased knowledge of use of DME, difficulty walking, decreased strength, impaired perceived functional ability, impaired tone, impaired UE functional use, postural dysfunction, and pain.   ACTIVITY LIMITATIONS: carrying, lifting, bending, standing,  squatting, stairs, bathing, dressing, hygiene/grooming, locomotion level, and caring for others  PARTICIPATION LIMITATIONS: meal prep, cleaning, interpersonal relationship, driving, shopping, community activity, and occupation  PERSONAL FACTORS: Behavior pattern, Fitness, Past/current experiences, Time since onset of injury/illness/exacerbation, Transportation, and 3+ comorbidities: tobacco dependence, HTN, ETOH, chronic back pain  are also affecting patient's functional outcome.   REHAB POTENTIAL: [place] PMH and personal factors  CLINICAL DECISION MAKING: Evolving/moderate complexity  EVALUATION COMPLEXITY: Moderate  PLAN:  PT FREQUENCY: 1x/week  PT DURATION: 10 weeks + 4 weeks (re-cert)  PLANNED INTERVENTIONS: Therapeutic exercises, Therapeutic activity, Neuromuscular re-education, Balance training, Gait training, Patient/Family education, Self Care, Joint mobilization, Stair training, Vestibular training, Visual/preceptual remediation/compensation, Orthotic/Fit training, DME instructions, Aquatic Therapy, Electrical stimulation, Taping, Manual therapy, and Re-evaluation  PLAN FOR NEXT SESSION: ASSESS R BP!  Modify HEP prn for LLE strengthening, static and dynamic balance, spasticity management (stretching - review HEP stretching and modify as needed!).  Gait training/pre-gait - pt uses LBQC, left quad control - step taps/overs; ASSESS LTGs - D/C?  Sadie Haber, PT, DPT 08/30/2023, 2:46 PM

## 2023-08-30 NOTE — Therapy (Unsigned)
OUTPATIENT OCCUPATIONAL THERAPY NEURO TREATMENT   Patient Name: Derek [name] MRN: [id number] DOB:06-Dec-1975, 47 y.o., male Today's Date: 08/30/2023  PCP: Aliene Beams, MD REFERRING PROVIDER: Ranelle Oyster, MD  END OF SESSION:  OT End of Session - 08/30/23 1331     Visit Number 13    Number of Visits 16   + evaluation   Date for OT Re-Evaluation 08/12/23    Authorization Type [employer] HMO    Progress Note Due on Visit 16    OT Start Time 1318    OT Stop Time 1400    OT Time Calculation (min) 42 min    Equipment Utilized During Treatment Estim    Activity Tolerance Patient tolerated treatment well    Behavior During Therapy [facility] for tasks assessed/performed              Past Medical History:  Diagnosis Date   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/07/2020   Alcohol dependence (HCC)    Back pain    Marijuana dependence (HCC)    Tobacco dependence    Past Surgical History:  Procedure Laterality Date   BUBBLE STUDY  01/10/2020   Procedure: BUBBLE STUDY;  Surgeon: Sande Rives, MD;  Location: [facility] ENDOSCOPY;  Service: Cardiovascular;;   TEE WITHOUT CARDIOVERSION N/A 01/10/2020   Procedure: TRANSESOPHAGEAL ECHOCARDIOGRAM (TEE);  Surgeon: Sande Rives, MD;  Location: [facility] ENDOSCOPY;  Service: Cardiovascular;  Laterality: N/A;   Patient Active Problem List   Diagnosis Date Noted   Moderate major depression, single episode (HCC) 04/14/2022   Arteriosclerosis of carotid artery 02/01/2022   Essential hypertension 02/01/2022   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) 02/01/2022   History of substance abuse (HCC) 02/01/2022   Iron deficiency anemia 02/01/2022   Late effects of cerebrovascular disease 02/01/2022   Obesity 02/01/2022   Pure hypercholesterolemia 02/01/2022   Pain due to onychomycosis of toenails of both feet 06/10/2021   Neuropathic pain 04/09/2020   Pain    Spastic hemiparesis (HCC)     History of hypertension    Dyslipidemia    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   Leukocytosis 01/12/2020   Cerebrovascular accident (CVA) due to thrombosis of right middle cerebral artery (HCC)    Chronic pain syndrome    Hypokalemia    Dysphagia, post-stroke    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/07/2020   Tobacco dependence    Marijuana dependence (HCC)    Alcohol dependence (HCC)     ONSET DATE: referral: 05/03/2023  REFERRING DIAG: B14.782 (ICD-10-CM) - Right middle cerebral artery stroke (HCC) G81.10 (ICD-10-CM) - Spastic hemiparesis (HCC)  DX: spasticity LUE s/p botox pecs and finger flexors on 02/09/23 - scheduled again next month (August) DX: CVA with spastic left hemiparesis RX: Eval and treat, resume therapies, bp better controlledDX: CVA with spastic left hemiparesis RX: Eval and treat, resume therapies, bp better controlled THERAPY DIAG:  Muscle weakness  Other lack of coordination  Contracture, left hand  Spastic hemiplegia affecting nondominant side (HCC)  Rationale for Evaluation and Treatment: Rehabilitation  SUBJECTIVE:   SUBJECTIVE STATEMENT:  Pt reports his splint has been comfortable and he just took it off awhile ago.  Pt reported he got his medication refilled and now has Baclofen and Lyrica etc at this time.  Med list updated as reviewed with pt.   Pt also mentioned he was supposed to get some sort of robotic arm but he never finished the  process to get it.  OTR able to look into his chart to find that he was being considered for Myomo.  Pt accompanied by: self   PERTINENT HISTORY: Spastic L Hemiparesis.  PMH: R MCA CVA, Alcohol Dependence (occasional), Marijuana (NA)/Tobacco ([place]) Dependence, Back Pain  PRECAUTIONS: Other: no driving  WEIGHT BEARING RESTRICTIONS: No  PAIN:  Are you having pain? Yes: NPRS scale: 4-5/10 Pain location: L neck/upper trap/hip L  Pain description: aching  Aggravating factors: laying on  it Relieving factors: Pain patch (doesn't have rx anymore) stretches, pain meds  FALLS: Has patient fallen in last 6 months? No  LIVING ENVIRONMENT: Lives with:  roommate Lives in: House 8 steps to enter (B rails but uses R side rail going up), 1 level home Has following equipment at home: Quad cane small base, Wheelchair (manual), Shower bench, and bed side commode - over the toilet in the bathroom, urinal at night   PLOF: Independent prior to stroke 2021, on disability  PATIENT GOALS: Improve Lt arm mobility and balance  OBJECTIVE:   HAND DOMINANCE: Right  ADLs: Overall ADLs: Has aide M-Sun 2-4 hrs/day to assist with bathing, and some dressing, cooking and cleaning Transfers/ambulation related to ADLs: mod I w/ quad cane Eating: mod I eating, difficulty cutting food and occasional assist from aide Grooming: mod I brushing teeth and shaving UB Dressing: mod I for pull over shirt, assist for button up shirt LB Dressing: Aide assist getting pants over feet and assist for donning shoes and socks Toileting: mod I Bathing: max assist from aide Tub Shower transfers: min assist for leg management with tub bench Equipment: Transfer tub bench, Grab bars, and hand held shower  IADLs: Shopping: has groceries delivered [employer]: aide does Meal Prep: aide or roommate does but can get snack or heat up something in microwave after someone gets it out of the fridge etc for him Community mobility: relies on friends Medication management: independent s/p aide sorting it into his pill box Financial management: roommate does Handwriting:  denies change  MOBILITY STATUS:  walks with quad cane in house and short distances   UPPER EXTREMITY ROM:  RUE AROM WNL's  LUE: Limited active movement - sh shrug w/ slight elbow flex dominated by synergy pattern  Passive ROM Right eval Left eval  Shoulder flexion  90*  Shoulder abduction  80*  Shoulder adduction    Shoulder extension     Shoulder internal rotation    Shoulder external rotation    Elbow flexion  90%  Elbow extension  90%  Wrist flexion  90%  Wrist extension  25%  Wrist ulnar deviation    Wrist radial deviation    Wrist pronation    Wrist supination  90%  (Blank rows = not tested)  Passive finger extension full with wrist flexion. Difficulty at PIP joint extension, especially LF - at rest, PIP flexed, DIP hyperextended  HAND FUNCTION: No function LT HAND  COORDINATION: No function Lt hand  SENSATION: Significantly diminished - cannot detect location, entire L UE is dull feeling  EDEMA: minimal Lt hand  MUSCLE TONE: LUE: Moderate, Hypertonic, and Modifed Ashworth Scale 2 = More marked increase in muscle tone through most of the ROM, but affected part(s) easily moved  COGNITION: Overall cognitive status:  pt reports memory deficits  VISION: Subjective report: still reports that he need glasses for reading.  Said he was 20/30 last time he went to the eye doctor but no changes in vision from stroke  Visual history:  none  PERCEPTION: Not tested  PRAXIS: Not tested  OBSERVATIONS: Pleasant gentlemen with somewhat flat affect and significant limitations with L UE ie) flickers of movement only noted although synergy patterns noted with yawning where L elbow flexed up to 90+ degrees.   TODAY'S TREATMENT:                                                                                                                               Neuromuscular Re-ed continued to help facilitate AROM of LUE.  Neuromuscular reeducation provided including ongoing visual, tactile and verbal cues and physical assistance as needed to stimulate the neuromuscular system and promote functional movement in LUE including elbow flexion and grasp/movement of L UE to hold, stack and manipulate cones.  Elbow flexion did not elicit yawning today like it did last week and pt was performing elbow flexion more on his own today.  LUE  initially supported by OTR but as motion was practiced, he was able to perform motion himself ie) lift, move hand past cone on his R, slide hand down the cone, grasp it and pick it up to move it out to the L side.  He does use use compensatory trunk motions to help get his arm in position but he is able to see and feel progression of LUE active movements without physical assist from OT. OTR suggested he consider easy to grasp objects with some stability ie) PVC pipe, marker, cone etc to practice at home.   PATIENT EDUCATION: Education details: AROM activities with LUE Person educat/ed: Patient Education method: Explanation, Demonstration, Tactile cues, and Verbal cues Education comprehension: verbalized understanding, returned demonstration, verbal cues required, tactile cues required, and needs further education  HOME EXERCISE PROGRAM: 07/01/23 - Shoulder table top slides & forearm supination Access Code: [email or web address] 07/20/23 - shoulder shrugs, PROM L UE wrist and digits - same access code 07/25/23 - elbow stretches and supine shoulder flexion - same access code 07/27/23 - elbow flexion/sh int rotation and towel squeeze - same access code 08/03/23 - Memory Strategies (WARM) 08/15/23 - Sleep positions; elbow flex/ext with cane - same access code  GOALS: Goals reviewed with patient? Yes  SHORT TERM GOALS: Target date:8/30//24  Independent with wear and care of resting hand splint for LUE up to daily. Baseline: 2-3x/week Goal status: MET 08/01/23 - RMI [employer] splint received 08/03/23 - Modifications to splint 08/08/23 - Most of night or couple of hours in the day  2.  Independent HEP for LUE self stretches  Baseline: None established a OT POC 3 months ago Goal status: MET  3.  Pt to verbalize understanding with memory compensation strategies ie) binder for info Baseline: Self reported memory issues and limited access to past HEPs, medication list etc. Goal status: IN PROGRESS   LONG TERM  GOALS: Target date: 08/12/23  Pt to verbalize understanding with potential A/E needs for one handed use to increase independence with ADLS (rocker  knife, shoe buttons, LH sponge, etc)  Baseline: Dependent on caregiver assistance for many aspects of ADLs Goal status: IN PROGRESS  2.  Pt to cut food I'ly with A/E Baseline: Dependent on caregiver assistance for many aspects of ADLs Goal status: IN PROGRESS  3.  Pt to consistently perform all UB dressing except buttons Baseline: Assisted by caregiver Goal status: MET  4.  Pt to perform LB dressing w/ no more than min assist with A/E prn (tie shoelaces) Baseline: Assisted by caregiver Goal status: MET 07/01/23 - patient is able to don/remove regular shoes (no splint) with laces fastened.    ASSESSMENT:  CLINICAL IMPRESSION: Patient seen today for occupational therapy treatment for L hemiplegia due to CVA. Patient has been showing increased active ROM of LUE with ability to pick up and move medium cones without constant support form OTR.  Ideas provided for progression of HEP ideas to maximize AROM and comfort of L elbow/shoulder.  Pt motivated to improve functional use of LUE and may actually motivate Myomo to help with carryover of HEP ideas for further progression.   PERFORMANCE DEFICITS: in functional skills including ADLs, IADLs, coordination, dexterity, sensation, tone, ROM, strength, pain, flexibility, Fine motor control, Gross motor control, mobility, body mechanics, decreased knowledge of use of DME, and UE functional use, cognitive skills including memory, and psychosocial skills including coping strategies.   IMPAIRMENTS: are limiting patient from ADLs, IADLs, and rest and sleep.   CO-MORBIDITIES: may have co-morbidities  that affects occupational performance. Patient will benefit from skilled OT to address above impairments and improve overall function.  REHAB POTENTIAL: Good for established goals   PLAN:  OT FREQUENCY:  1x/week  OT DURATION: 4 weeks additional weeks   PLANNED INTERVENTIONS: self care/ADL training, therapeutic exercise, therapeutic activity, neuromuscular re-education, manual therapy, passive range of motion, functional mobility training, splinting, moist heat, patient/family education, cognitive remediation/compensation, coping strategies training, and DME and/or AE instructions  RECOMMENDED OTHER SERVICES: P.T seeing patient also.   CONSULTED AND AGREED WITH PLAN OF CARE: Patient  PLAN FOR NEXT SESSION:   NMES LUE Myomo Rx/option ADL check - dressing UB Progress and print self PROM exercises for LUE. Continue [employer] for UEs - check on Active Hand options provided. Progress folder/binder for collection of info needed to monitor BP, list medications, collect AE info, combine HEP ideas and assist with Memory deficits  Victorino Sparrow, OT 08/30/2023, 3:01 PM

## 2023-09-06 ENCOUNTER — Encounter: Payer: Self-pay | Admitting: Physical Therapy

## 2023-09-06 ENCOUNTER — Ambulatory Visit: Payer: Medicare HMO | Attending: Family Medicine | Admitting: Occupational Therapy

## 2023-09-06 ENCOUNTER — Ambulatory Visit: Payer: Medicare HMO | Admitting: Physical Therapy

## 2023-09-06 VITALS — BP 108/77 | HR 62

## 2023-09-06 DIAGNOSIS — R293 Abnormal posture: Secondary | ICD-10-CM | POA: Insufficient documentation

## 2023-09-06 DIAGNOSIS — G8110 Spastic hemiplegia affecting unspecified side: Secondary | ICD-10-CM

## 2023-09-06 DIAGNOSIS — R278 Other lack of coordination: Secondary | ICD-10-CM

## 2023-09-06 DIAGNOSIS — M6281 Muscle weakness (generalized): Secondary | ICD-10-CM | POA: Diagnosis not present

## 2023-09-06 DIAGNOSIS — M24542 Contracture, left hand: Secondary | ICD-10-CM | POA: Diagnosis not present

## 2023-09-06 NOTE — Therapy (Signed)
OUTPATIENT PHYSICAL THERAPY NEURO TREATMENT - DISCHARGE SUMMARY   Patient Name: Derek [name] MRN: [id number] DOB:Aug 07, 1976, 47 y.o., male Today's Date: 09/06/2023   PCP: Aliene Beams, MD REFERRING PROVIDER: Ranelle Oyster, MD  PHYSICAL THERAPY DISCHARGE SUMMARY  Visits from Start of Care: 11  Current functional level related to goals / functional outcomes: See clinical impression statement.   Remaining deficits: High fall risk and L hemiparesis   Education / Equipment: Monitor BP as able.  Continue HEP.  Discharge plan and rationale.  Discussed episodic care in future (~3 months) with new referral if pt more consistent with work at home.  Progress towards goals.  Discussed patient practicing standing tolerance at counter using chair behind him for safety and slow progression of time in +5 second increments.   Patient agrees to discharge. Patient goals were partially met. Patient is being discharged due to maximized rehab potential.    END OF SESSION:  PT End of Session - 09/06/23 1410     Visit Number 11    Number of Visits 13   9 + 4   Date for PT Re-Evaluation 09/09/23   pushed out due to multi-D scheduling delay at re-cert on 30/8   Authorization Type [employer] MEDICARE    Progress Note Due on Visit 10    PT Start Time 1403    PT Stop Time 1445    PT Time Calculation (min) 42 min    Equipment Utilized During Treatment Gait belt    Activity Tolerance Patient tolerated treatment well    Behavior During Therapy [facility] for tasks assessed/performed            Past Medical History:  Diagnosis Date   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/07/2020   Alcohol dependence (HCC)    Back pain    Marijuana dependence (HCC)    Tobacco dependence    Past Surgical History:  Procedure Laterality Date   BUBBLE STUDY  01/10/2020   Procedure: BUBBLE STUDY;  Surgeon: Sande Rives, MD;  Location: [facility] ENDOSCOPY;  Service: Cardiovascular;;   TEE WITHOUT CARDIOVERSION  N/A 01/10/2020   Procedure: TRANSESOPHAGEAL ECHOCARDIOGRAM (TEE);  Surgeon: Sande Rives, MD;  Location: [facility] ENDOSCOPY;  Service: Cardiovascular;  Laterality: N/A;   Patient Active Problem List   Diagnosis Date Noted   Moderate major depression, single episode (HCC) 04/14/2022   Arteriosclerosis of carotid artery 02/01/2022   Essential hypertension 02/01/2022   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) 02/01/2022   History of substance abuse (HCC) 02/01/2022   Iron deficiency anemia 02/01/2022   Late effects of cerebrovascular disease 02/01/2022   Obesity 02/01/2022   Pure hypercholesterolemia 02/01/2022   Pain due to onychomycosis of toenails of both feet 06/10/2021   Neuropathic pain 04/09/2020   Pain    Spastic hemiparesis (HCC)    History of hypertension    Dyslipidemia    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   Leukocytosis 01/12/2020   Cerebrovascular accident (CVA) due to thrombosis of right middle cerebral artery (HCC)    Chronic pain syndrome    Hypokalemia    Dysphagia, post-stroke    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/07/2020   Tobacco dependence    Marijuana dependence (HCC)    Alcohol dependence (HCC)     ONSET DATE: 2021 (CVA)  REFERRING DIAG: I63.511 (ICD-10-CM) - Right middle cerebral artery stroke (HCC) G81.10 (ICD-10-CM) - Spastic hemiparesis (HCC)  THERAPY DIAG:  Muscle weakness  Other lack of coordination  Spastic hemiplegia affecting nondominant side (HCC)  Abnormal posture  Rationale for Evaluation and Treatment: Rehabilitation  SUBJECTIVE:                                                                                                                                                                                             SUBJECTIVE STATEMENT: Patient received following OT, he brought [facility] to session.  He denies falls.  Pt states he has only been practicing sliding his foot back  in bed, but no other exercises of note. Pt accompanied by: self  PERTINENT HISTORY: R MCA CVA, Alcohol Dependence, Marijuana/Tobacco Dependence, Back Pain   PAIN:  Are you having pain? Yes: NPRS scale: 5/10 Pain location: left shoulder and left hip Pain description: achy Aggravating factors: leaning on it, varies as to which positions irritate it Relieving factors: pain patch RUE in sitting prior to interventions:   Today's Vitals   09/06/23 1406  BP: 108/77  Pulse: 62  *Pt requests to ambulate to restroom, gone x 11 minutes following BP.  PRECAUTIONS: Fall and Other: L spastic hemi  WEIGHT BEARING RESTRICTIONS: No  FALLS: Has patient fallen in last 6 months? No  LIVING ENVIRONMENT: Lives with:  roommate Lives in: House/apartment Stairs: Yes: External: 8 steps; bilateral but cannot reach both Has following equipment at home: Quad cane large base, Hemi walker, Wheelchair (manual), shower chair, and Grab bars  PLOF: Independent with household mobility with device, Needs assistance with ADLs, and Needs assistance with homemaking-aide comes in the mornings for 4 hours on weekdays and 2 hours on weekends  PATIENT GOALS: "to get some mobility on my left side"  "to be able to stand longer and walk easier"  OBJECTIVE:   DIAGNOSTIC FINDINGS: 01/09/20 head CT: IMPRESSION: 1. Evolving right cerebral infarct with evidence for hemorrhagic transformation at the right basal ganglia, similar in appearance and distribution as compared to previous MRI. Associated mass effect with 3 mm of right-to-left shift. No hydrocephalus or ventricular trapping. 2. No other new acute intracranial abnormality.  COGNITION: Overall cognitive status: Within functional limits for tasks assessed   SENSATION: WFL  COORDINATION: LE RAMS:  limited by lack of DF AROM on L Heel-to-shin:  can cross left to right, but cannot bring heel directly to shin   MUSCLE TONE: LLE: Hypertonic Clonus  present  POSTURE: rounded shoulders, forward head, increased thoracic kyphosis, posterior pelvic tilt, left pelvic obliquity, and weight shift right  LOWER EXTREMITY MMT:    MMT Right Eval Left Eval  Hip flexion 5 2-  Hip extension    Hip abduction 5 3  Hip adduction 5 3  Hip internal rotation    Hip external rotation    Knee flexion 4 2+/5  Knee extension 5 3 w/ increased time he can achieve full extension  Ankle dorsiflexion 5 0  Ankle plantarflexion    Ankle inversion    Ankle eversion    (Blank rows = not tested)  BED MOBILITY:  Independent per patient report - he reports his bed is lower which helps  TRANSFERS: Assistive device utilized: Quad cane large base  Sit to stand: SBA Stand to sit: SBA Chair to chair: SBA  GAIT: Gait pattern: step to pattern, decreased arm swing- Left, decreased step length- Right, decreased stance time- Left, decreased hip/knee flexion- Left, decreased ankle dorsiflexion- Left, circumduction- Left, Left hip hike, lateral lean- Right, trunk rotated posterior- Left, and poor foot clearance- Left Distance walked: various clinic distances Assistive device utilized: Quad cane large base Level of assistance: SBA Comments:  LUE held in flexion and IR and LLE lacking heel strike due to PF tone.  Gentle L knee flexion maintained in stance due to spasticity.  FUNCTIONAL TESTS:  5 times sit to stand: 18.53 seconds w/ RUE support and right lateral lean Timed up and go (TUG): 22.69 seconds w/ CGA and LBQC Berg Balance Scale: To be finished next session.  [facility] PT Assessment - 09/06/23 1421       Berg Balance Test   Sit to Stand Able to stand  independently using hands    Standing Unsupported Able to stand safely 2 minutes    Sitting with Back Unsupported but Feet Supported on Floor or Stool Able to sit safely and securely 2 minutes    Stand to Sit Controls descent by using hands    Transfers Able to transfer safely, definite need of hands     Standing Unsupported with Eyes Closed Able to stand 10 seconds safely    Standing Unsupported with Feet Together Needs help to attain position but able to stand for 30 seconds with feet together    From Standing, Reach Forward with Outstretched Arm Can reach confidently >25 cm (10")    From Standing Position, Pick up Object from Floor Able to pick up shoe, needs supervision   pt demonstates catch/jerky movement on return to upright reporting hip catch   From Standing Position, Turn to Look Behind Over each Shoulder Looks behind from both sides and weight shifts well    Turn 360 Degrees Needs close supervision or verbal cueing    Standing Unsupported, Alternately Place Feet on Step/Stool Able to complete >2 steps/needs minimal assist    Standing Unsupported, One Foot in Front Able to take small step independently and hold 30 seconds    Standing on One Leg Tries to lift leg/unable to hold 3 seconds but remains standing independently    Total Score 38    Berg comment: 38/56 = significant fall risk             PATIENT SURVEYS:  FOTO not captured due to chronicity of CVA.  TODAY'S TREATMENT:    RUE in sitting prior to session:  Today's Vitals   09/06/23 1406  BP: 108/77  Pulse: 62   -5xSTS:  14.00 seconds w/ RUE support and lean -TUG:  22.57 seconds on initial attempt, pt did not sit immediately; repeated:  16.81 sec CGA w/ LBQC -BERG:    [facility] PT Assessment - 09/06/23 1421       Berg Balance Test   Sit to Stand Able to stand  independently using hands    Standing Unsupported Able to stand safely 2 minutes    Sitting with Back Unsupported but Feet Supported on Floor or Stool Able to sit safely and securely 2 minutes    Stand to Sit Controls descent by using hands    Transfers Able to transfer safely, definite need of hands    Standing Unsupported with Eyes Closed  Able to stand 10 seconds safely    Standing Unsupported with Feet Together Needs help to attain position but able to stand for 30 seconds with feet together    From Standing, Reach Forward with Outstretched Arm Can reach confidently >25 cm (10")    From Standing Position, Pick up Object from Floor Able to pick up shoe, needs supervision   pt demonstates catch/jerky movement on return to upright reporting hip catch   From Standing Position, Turn to Look Behind Over each Shoulder Looks behind from both sides and weight shifts well    Turn 360 Degrees Needs close supervision or verbal cueing    Standing Unsupported, Alternately Place Feet on Step/Stool Able to complete >2 steps/needs minimal assist    Standing Unsupported, One Foot in Front Able to take small step independently and hold 30 seconds    Standing on One Leg Tries to lift leg/unable to hold 3 seconds but remains standing independently    Total Score 38    Berg comment: 38/56 = significant fall risk           *Several rest breaks requested.  PATIENT EDUCATION: Education details: Monitor BP as able.  Continue HEP.  Discharge plan and rationale.  Discussed episodic care in future (~3 months) with new referral if pt more consistent with work at home.  Progress towards goals.  Discussed patient practicing standing tolerance at counter using chair behind him for safety and slow progression of time in +5 second increments. Person educated: Patient Education method: Explanation Education comprehension: verbalized understanding and needs further education  HOME EXERCISE PROGRAM: [id number] - added SLS and tandem at counter on 07/20/2023  GOALS: Goals reviewed with patient? Yes  SHORT TERM GOALS: Target date: 06/24/2023  Pt will be independent and compliant with strengthening, stretching, and balance focused HEP in order to maintain functional progress and improve mobility. Baseline:  Reviewed (9/4) Goal status: IN PROGRESS  2.  Pt  will decrease 5xSTS to </=15.53 seconds using RUE support as needed with improved midline in order to demonstrate decreased risk for falls and improved functional bilateral LE strength and power. Baseline: 18.53 sec w/ RUE support and right weight shift; 19.28 sec w/ RUE support and right weight shift (9/4) Goal status: NOT MET  3.  Pt will demonstrate TUG of </=18.69 seconds at SBA level in order to decrease risk of falls and improve functional mobility using LRAD. Baseline: 22.69 sec CGA w/ LBQC; 23.81 sec SBA w/ LBQC (9/4) Goal status: NOT MET  LONG TERM GOALS: Target date: 07/22/2023 (Note:  due to scheduling, likely assess single goal at LTG and update date for last visit at 10 week mark)  Pt will  decrease 5xSTS to </=12.53 seconds using RUE support as needed with improved midline in order to demonstrate decreased risk for falls and improved functional bilateral LE strength and power. Baseline: 18.53 sec w/ RUE support and right weight shift; 14.78 sec w/ RUE support and right weight shift (10/2) Goal status: PARTIALLY MET  2.  Pt will demonstrate TUG of </=14.69 seconds at SBA level in order to decrease risk of falls and improve functional mobility using LRAD. Baseline: 22.69 sec CGA w/ LBQC Goal status: INITIAL  3.  Pt will increase BERG balance score to at least 41/56 to demonstrate improved static balance. Baseline: 36/56 (8/30); 39/56 (10/2) Goal status: PARTIALLY MET  4.  Pt will ambulate >/=250 feet with LBQC and no more than SBA w/o increased shoulder pain and improved left LE control to promote household and community access.  Baseline:  L shoulder pain 4-5/10, left knee flexion in stance; pt has increased LLE tone present during all ambulation today and baseline left shoulder pain 3/10 (10/2)  Goal status:  NOT MET  GOALS (for re-cert): Goals reviewed with patient? Yes  SHORT TERM GOALS = LONG TERM GOALS: Target date: 09/02/2023  Pt will decrease 5xSTS to </=10.53 seconds  using RUE support as needed with improved midline in order to demonstrate decreased risk for falls and improved functional bilateral LE strength and power. Baseline: 18.53 sec w/ RUE support and right weight shift; 14.78 sec w/ RUE support and right weight shift (10/2); 14.00 seconds w/ RUE support and lean Goal status: NOT MET  2.  Pt will demonstrate TUG of </=14.69 seconds at SBA level in order to decrease risk of falls and improve functional mobility using LRAD. Baseline: 22.69 sec CGA w/ LBQC; 16.81 sec CGA w/ LBQC Goal status: PARTIALLY MET  3.  Pt will increase BERG balance score to at least 43/56 to demonstrate improved static balance. Baseline: 36/56 (8/30); 39/56 (10/2); 38/56 (11/5) Goal status: NOT MET  ASSESSMENT:  CLINICAL IMPRESSION: Patient discharged today following goal assessment with limited progress displayed.  PT course complicated by several late visits and pt needing frequent bathroom breaks limiting session times.  Pt remains sedentary in home environment despite modifications made to improve independence with HEP.  His static balance performance has leveled out scoring 38/56 on BERG which is single point less than prior assessment.  He did show improvement on second trial of TUG to roughly 16 seconds w/ CGA.  His 5xSTS insignificantly improved <1 second.  He is appropriate for discharge at this time and would likely benefit from episodic care in the future to "tune up" deficits and prevent worsened fall risk over time.  OBJECTIVE IMPAIRMENTS: Abnormal gait, decreased activity tolerance, decreased balance, decreased coordination, decreased endurance, decreased knowledge of condition, decreased knowledge of use of DME, difficulty walking, decreased strength, impaired perceived functional ability, impaired tone, impaired UE functional use, postural dysfunction, and pain.   ACTIVITY LIMITATIONS: carrying, lifting, bending, standing, squatting, stairs, bathing, dressing,  hygiene/grooming, locomotion level, and caring for others  PARTICIPATION LIMITATIONS: meal prep, cleaning, interpersonal relationship, driving, shopping, community activity, and occupation  PERSONAL FACTORS: Behavior pattern, Fitness, Past/current experiences, Time since onset of injury/illness/exacerbation, Transportation, and 3+ comorbidities: tobacco dependence, HTN, ETOH, chronic back pain  are also affecting patient's functional outcome.   REHAB POTENTIAL: [place] PMH and personal factors  CLINICAL DECISION MAKING: Evolving/moderate complexity  EVALUATION COMPLEXITY: Moderate  PLAN:  PT FREQUENCY: 1x/week  PT DURATION: 10 weeks + 4 weeks (re-cert)  PLANNED INTERVENTIONS: Therapeutic  exercises, Therapeutic activity, Neuromuscular re-education, Balance training, Gait training, Patient/Family education, Self Care, Joint mobilization, Stair training, Vestibular training, Visual/preceptual remediation/compensation, Orthotic/Fit training, DME instructions, Aquatic Therapy, Electrical stimulation, Taping, Manual therapy, and Re-evaluation  PLAN FOR NEXT SESSION: N/A  Sadie Haber, PT, DPT 09/06/2023, 2:49 PM

## 2023-09-06 NOTE — Therapy (Unsigned)
OUTPATIENT OCCUPATIONAL THERAPY NEURO TREATMENT & DISCHARGE SUMMARY   Patient Name: Derek [name] MRN: [id number] DOB:02-06-1976, 47 y.o., male 64 Date: 09/06/2023  PCP: Aliene Beams, MD REFERRING PROVIDER: Ranelle Oyster, MD  END OF SESSION:  OT End of Session - 09/06/23 1320     Visit Number 14    Number of Visits 16   + evaluation   Date for OT Re-Evaluation 08/12/23    Authorization Type [employer] MEDICARE HMO    Progress Note Due on Visit 16    OT Start Time 1320    OT Stop Time 1400    OT Time Calculation (min) 40 min    Equipment Utilized During Treatment --    Activity Tolerance Patient tolerated treatment well    Behavior During Therapy [facility] for tasks assessed/performed              Past Medical History:  Diagnosis Date   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/07/2020   Alcohol dependence (HCC)    Back pain    Marijuana dependence (HCC)    Tobacco dependence    Past Surgical History:  Procedure Laterality Date   BUBBLE STUDY  01/10/2020   Procedure: BUBBLE STUDY;  Surgeon: Sande Rives, MD;  Location: [facility] ENDOSCOPY;  Service: Cardiovascular;;   TEE WITHOUT CARDIOVERSION N/A 01/10/2020   Procedure: TRANSESOPHAGEAL ECHOCARDIOGRAM (TEE);  Surgeon: Sande Rives, MD;  Location: [facility] ENDOSCOPY;  Service: Cardiovascular;  Laterality: N/A;   Patient Active Problem List   Diagnosis Date Noted   Moderate major depression, single episode (HCC) 04/14/2022   Arteriosclerosis of carotid artery 02/01/2022   Essential hypertension 02/01/2022   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) 02/01/2022   History of substance abuse (HCC) 02/01/2022   Iron deficiency anemia 02/01/2022   Late effects of cerebrovascular disease 02/01/2022   Obesity 02/01/2022   Pure hypercholesterolemia 02/01/2022   Pain due to onychomycosis of toenails of both feet 06/10/2021   Neuropathic pain 04/09/2020   Pain    Spastic  hemiparesis (HCC)    History of hypertension    Dyslipidemia    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   Leukocytosis 01/12/2020   Cerebrovascular accident (CVA) due to thrombosis of right middle cerebral artery (HCC)    Chronic pain syndrome    Hypokalemia    Dysphagia, post-stroke    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/07/2020   Tobacco dependence    Marijuana dependence (HCC)    Alcohol dependence (HCC)     ONSET DATE: referral: 05/03/2023  REFERRING DIAG: P32.951 (ICD-10-CM) - Right middle cerebral artery stroke (HCC) G81.10 (ICD-10-CM) - Spastic hemiparesis (HCC)  DX: spasticity LUE s/p botox pecs and finger flexors on 02/09/23 - scheduled again next month (August) DX: CVA with spastic left hemiparesis RX: Eval and treat, resume therapies, bp better controlledDX: CVA with spastic left hemiparesis RX: Eval and treat, resume therapies, bp better controlled THERAPY DIAG:  Muscle weakness  Other lack of coordination  Contracture, left hand  Rationale for Evaluation and Treatment: Rehabilitation  SUBJECTIVE:   SUBJECTIVE STATEMENT:  Pt reports his splint has been comfortable and he continues to use it regularly.  Pt awaiting further contact re: [facility] and he is aware that today is his last scheduled day of therapies.  Pt accompanied by: self   PERTINENT HISTORY: Spastic L Hemiparesis.  PMH: R MCA CVA, Alcohol Dependence (occasional), Marijuana (NA)/Tobacco ([place]) Dependence, Back Pain  PRECAUTIONS: Other: no driving  WEIGHT BEARING RESTRICTIONS: No  PAIN:  Are you having pain? Yes: NPRS scale: 4-5/10 Pain location: L neck/upper trap/hip L  Pain description: aching  Aggravating factors: laying on it Relieving factors: Pain patch (doesn't have rx anymore) stretches, pain meds  FALLS: Has patient fallen in last 6 months? No  LIVING ENVIRONMENT: Lives with:  roommate Lives in: House 8 steps to enter (B rails but uses R side rail going up), 1  level home Has following equipment at home: Quad cane small base, Wheelchair (manual), Shower bench, and bed side commode - over the toilet in the bathroom, urinal at night   PLOF: Independent prior to stroke 2021, on disability  PATIENT GOALS: Improve Lt arm mobility and balance  OBJECTIVE:   HAND DOMINANCE: Right  ADLs: Overall ADLs: Has aide M-Sun 2-4 hrs/day to assist with bathing, and some dressing, cooking and cleaning Transfers/ambulation related to ADLs: mod I w/ quad cane Eating: mod I eating, difficulty cutting food and occasional assist from aide Grooming: mod I brushing teeth and shaving UB Dressing: mod I for pull over shirt, assist for button up shirt LB Dressing: Aide assist getting pants over feet and assist for donning shoes and socks Toileting: mod I Bathing: max assist from aide Tub Shower transfers: min assist for leg management with tub bench Equipment: Transfer tub bench, Grab bars, and hand held shower  IADLs: Shopping: has groceries delivered [employer]: aide does Meal Prep: aide or roommate does but can get snack or heat up something in microwave after someone gets it out of the fridge etc for him Community mobility: relies on friends Medication management: independent s/p aide sorting it into his pill box Financial management: roommate does Handwriting:  denies change  MOBILITY STATUS:  walks with quad cane in house and short distances   UPPER EXTREMITY ROM:  RUE AROM WNL's  LUE: Limited active movement - sh shrug w/ slight elbow flex dominated by synergy pattern  Passive ROM Right eval Left eval  Shoulder flexion  90*  Shoulder abduction  80*  Shoulder adduction    Shoulder extension    Shoulder internal rotation    Shoulder external rotation    Elbow flexion  90%  Elbow extension  90%  Wrist flexion  90%  Wrist extension  25%  Wrist ulnar deviation    Wrist radial deviation    Wrist pronation    Wrist supination  90%  (Blank  rows = not tested)  Passive finger extension full with wrist flexion. Difficulty at PIP joint extension, especially LF - at rest, PIP flexed, DIP hyperextended  HAND FUNCTION: No function LT HAND  COORDINATION: No function Lt hand  SENSATION: Significantly diminished - cannot detect location, entire L UE is dull feeling  EDEMA: minimal Lt hand  MUSCLE TONE: LUE: Moderate, Hypertonic, and Modifed Ashworth Scale 2 = More marked increase in muscle tone through most of the ROM, but affected part(s) easily moved  COGNITION: Overall cognitive status:  pt reports memory deficits  VISION: Subjective report: still reports that he need glasses for reading.  Said he was 20/30 last time he went to the eye doctor but no changes in vision from stroke  Visual history:  none  PERCEPTION: Not tested  PRAXIS: Not tested  OBSERVATIONS: Pleasant gentlemen with somewhat flat affect and significant limitations with L UE ie) flickers of movement only noted although synergy patterns noted with yawning where L elbow flexed up to 90+ degrees.  TODAY'S TREATMENT:  Self Care:   Upon review of goals for discharge today, pt reported areas he gets help from caregiver for ADLs including some UB dressing and application of deodorant.  Pt has improve active movement in LUE and is shown how to use forward slides on table top to reach under arm or moving arm atop a pillow at EOB to reach under arm also.  Pt able to move L arm multiple times to reach under it with R hand and even move L arm to rub R arm.  Thermoplastic handle made for L hand which he could use to apply lotion to his body with L hand.  He is encouraged to continue to work on L UE HEP program, further self care and open and closing hand to stabilize/hold objects.   Therapeutic Activities:  Patient provided with discharge  instructions including review of HEP ideas as provided in [employer] handouts which pt reports he continues to have at home.  Neuromuscular reeducation provided including ongoing visual, tactile and verbal cues and physical assistance as needed to promote functional movement in LUE to move arm and elicit appropriate motions for ADLs.  PATIENT EDUCATION: Education details: Discharge instructions Person educat/ed: Patient Education method: Explanation, Demonstration, Tactile cues, and Verbal cues Education comprehension: verbalized understanding and returned demonstration  HOME EXERCISE PROGRAM: 07/01/23 - Shoulder table top slides & forearm supination Access Code: [email or web address] 07/20/23 - shoulder shrugs, PROM L UE wrist and digits - same access code 07/25/23 - elbow stretches and supine shoulder flexion - same access code 07/27/23 - elbow flexion/sh int rotation and towel squeeze - same access code 08/03/23 - Memory Strategies (WARM) 08/15/23 - Sleep positions; elbow flex/ext with cane - same access code  GOALS: Goals reviewed with patient? Yes  SHORT TERM GOALS: Target date:8/30//24  Independent with wear and care of resting hand splint for LUE up to daily. Baseline: 2-3x/week Goal status: MET 08/01/23 - RMI [employer] splint received 08/03/23 - Modifications to splint 08/08/23 - Most of night or couple of hours in the day  2.  Independent HEP for LUE self stretches  Baseline: None established a OT POC 3 months ago Goal status: MET  3.  Pt to verbalize understanding with memory compensation strategies ie) binder for info Baseline: Self reported memory issues and limited access to past HEPs, medication list etc. Goal status: MET   LONG TERM GOALS: Target date: 08/12/23  Pt to verbalize understanding with potential A/E needs for one handed use to increase independence with ADLS (rocker knife, shoe buttons, LH sponge, etc)  Baseline: Dependent on caregiver assistance for many aspects of  ADLs Goal status: MET  2.  Pt to cut food I'ly with A/E Baseline: Dependent on caregiver assistance for many aspects of ADLs Goal status: MET  3.  Pt to consistently perform all UB dressing except buttons Baseline: Assisted by caregiver Goal status: MET  4.  Pt to perform LB dressing w/ no more than min assist with A/E prn (tie shoelaces) Baseline: Assisted by caregiver Goal status: MET 07/01/23 - patient is able to don/remove regular shoes (no splint) with laces fastened.    ASSESSMENT:  CLINICAL IMPRESSION: Patient seen today for occupational therapy treatment for L hemiplegia due to CVA. Patient is awaiting further exploration of qualifications for [facility] with info submitted for consideration.  Pt has met current OT goals as established in OT POC and is set for DC from OT today.   PERFORMANCE DEFICITS: in functional skills including ADLs, IADLs, coordination, dexterity, sensation,  tone, ROM, strength, pain, flexibility, Fine motor control, Gross motor control, mobility, body mechanics, decreased knowledge of use of DME, and UE functional use, cognitive skills including memory, and psychosocial skills including coping strategies.   IMPAIRMENTS: are limiting patient from ADLs, IADLs, and rest and sleep.   CO-MORBIDITIES: may have co-morbidities  that affects occupational performance. Patient will benefit from skilled OT to address above impairments and improve overall function.  REHAB POTENTIAL: Good for established goals   PLAN:  OCCUPATIONAL THERAPY DISCHARGE SUMMARY  Visits from Start of Care: 14  Current functional level related to goals / functional outcomes: Pt has met all goals (3/3 short term and 4/4 long term) to satisfactory levels and is pleased with outcomes.   Remaining deficits: Pt has ongoing chronic functional deficits and pain in L hemiplegic limb but is able to manage with splints, HEP and home based pain management options.  Education / Equipment: Pt has  all needed materials and education. Pt understands how to continue on with self-management. See tx notes for more details.   Patient agrees to discharge due to max benefits received from outpatient occupational therapy at this time.  Plans to seek new referral when Myomo intervention is scheduled.     Victorino Sparrow, OT 09/06/2023, 5:10 PM

## 2023-09-26 ENCOUNTER — Encounter: Payer: Self-pay | Admitting: Podiatry

## 2023-09-26 ENCOUNTER — Ambulatory Visit (INDEPENDENT_AMBULATORY_CARE_PROVIDER_SITE_OTHER): Payer: Medicare HMO | Admitting: Podiatry

## 2023-09-26 DIAGNOSIS — M79674 Pain in right toe(s): Secondary | ICD-10-CM | POA: Diagnosis not present

## 2023-09-26 DIAGNOSIS — M792 Neuralgia and neuritis, unspecified: Secondary | ICD-10-CM

## 2023-09-26 DIAGNOSIS — B351 Tinea unguium: Secondary | ICD-10-CM | POA: Diagnosis not present

## 2023-09-26 DIAGNOSIS — M79675 Pain in left toe(s): Secondary | ICD-10-CM

## 2023-09-26 NOTE — Progress Notes (Signed)
This patient presents to the office for evaluation and treatment of long thick painful nails .  This patient is unable to trim his own nails since the patient cannot reach his feet.  Patient says the nails are painful walking and wearing his shoes.  Patient has history of CVA .   He returns for preventive foot care services.  General Appearance  Alert, conversant and in no acute stress.  Vascular  Dorsalis pedis and posterior tibial  pulses are palpable  bilaterally.  Capillary return is within normal limits  bilaterally. Temperature is within normal limits  bilaterally.  Neurologic  Senn-Weinstein monofilament wire test within normal limits  bilaterally. Muscle power within normal limits bilaterally.  Nails Thick disfigured discolored nails with subungual debris  from hallux to fifth toes bilaterally. No evidence of bacterial infection or drainage bilaterally.  Orthopedic  No limitations of motion  feet .  No crepitus or effusions noted.  No bony pathology or digital deformities noted.  Skin  normotropic skin with no porokeratosis noted bilaterally.  No signs of infections or ulcers noted.     Onychomycosis  Pain in toes right foot  Pain in toes left foot  Debridement  of nails  1-5  B/L with a nail nipper.  Nails were then filed using a dremel tool with no incidents.    RTC  12   weeks    Helane Gunther DPM

## 2023-09-27 NOTE — Patient Instructions (Signed)
ALWAYS FEEL FREE TO CALL OUR OFFICE WITH ANY PROBLEMS OR QUESTIONS [phone])  **PLEASE NOTE** ALL MEDICATION REFILL REQUESTS (INCLUDING CONTROLLED SUBSTANCES) NEED TO BE MADE AT LEAST 7 DAYS PRIOR TO REFILL BEING DUE. ANY REFILL REQUESTS INSIDE THAT TIME FRAME MAY RESULT IN DELAYS IN RECEIVING YOUR PRESCRIPTION.

## 2023-09-28 ENCOUNTER — Encounter: Payer: Self-pay | Admitting: Physical Medicine & Rehabilitation

## 2023-09-28 ENCOUNTER — Ambulatory Visit: Payer: Medicare HMO | Admitting: Podiatry

## 2023-09-28 ENCOUNTER — Encounter: Payer: Medicare HMO | Attending: Physical Medicine & Rehabilitation | Admitting: Physical Medicine & Rehabilitation

## 2023-09-28 VITALS — BP 130/85 | HR 66 | Ht 71.0 in | Wt 226.0 lb

## 2023-09-28 DIAGNOSIS — I63511 Cerebral infarction due to unspecified occlusion or stenosis of right middle cerebral artery: Secondary | ICD-10-CM | POA: Insufficient documentation

## 2023-09-28 DIAGNOSIS — G8110 Spastic hemiplegia affecting unspecified side: Secondary | ICD-10-CM | POA: Diagnosis not present

## 2023-09-28 MED ORDER — INCOBOTULINUMTOXINA 100 UNITS IM SOLR
500.0000 [IU] | Freq: Once | INTRAMUSCULAR | Status: AC
Start: 2023-09-28 — End: 2023-09-28
  Administered 2023-09-28: 500 [IU] via INTRAMUSCULAR

## 2023-09-28 NOTE — Progress Notes (Signed)
XEOMIN Injection for spasticity of upper extremity using needle EMG guidance Indication: Acute ischemic right MCA stroke (HCC) - Plan: incobotulinumtoxinA (XEOMIN) 100 units injection 500 Units  Spastic hemiparesis (HCC) - Plan: incobotulinumtoxinA (XEOMIN) 100 units injection 500 Units G81.10  Dilution: 100 Units/ml        Total Units Injected: 500 Indication: Severe spasticity which interferes with ADL,mobility and/or  hygiene and is unresponsive to medication management and other conservative care Informed consent was obtained after describing risks and benefits of the procedure with the patient. This includes bleeding, bruising, infection, excessive weakness, or medication side effects. A REMS form is on file and signed.  Needle: 50mm injectable monopolar needle electrode    Number of units per muscle Pectoralis Major 50 units Pectoralis Minor 50 units Biceps 50 units Triceps 50 units FCR 25 units FCU 25 units FDS 100 units FDP 100 units FPL 0 units Palmaris Longus 0 units Pronator Teres 50 units Pronator Quadratus 0 units Lumbricals 0 units All injections were done after obtaining appropriate EMG activity and after negative drawback for blood. The patient tolerated the procedure well. Post procedure instructions were given. Return in about 3 months (around 12/29/2023) for xeomin 500 units to LUE.  Wrote rx'es for therapy at neuro rehab pt and ot

## 2023-10-13 ENCOUNTER — Other Ambulatory Visit: Payer: Self-pay | Admitting: Physical Medicine & Rehabilitation

## 2023-10-28 ENCOUNTER — Emergency Department (HOSPITAL_COMMUNITY): Payer: Medicare HMO

## 2023-10-28 ENCOUNTER — Emergency Department (HOSPITAL_COMMUNITY)
Admission: EM | Admit: 2023-10-28 | Discharge: 2023-10-28 | Disposition: A | Discharge status: Home / Self Care | Payer: Medicare HMO | Attending: Emergency Medicine | Admitting: Emergency Medicine

## 2023-10-28 DIAGNOSIS — Z7982 Long term (current) use of aspirin: Secondary | ICD-10-CM | POA: Diagnosis not present

## 2023-10-28 DIAGNOSIS — W109XXA Fall (on) (from) unspecified stairs and steps, initial encounter: Secondary | ICD-10-CM | POA: Diagnosis not present

## 2023-10-28 DIAGNOSIS — S0990XA Unspecified injury of head, initial encounter: Secondary | ICD-10-CM | POA: Diagnosis not present

## 2023-10-28 DIAGNOSIS — M19012 Primary osteoarthritis, left shoulder: Secondary | ICD-10-CM | POA: Diagnosis not present

## 2023-10-28 DIAGNOSIS — R519 Headache, unspecified: Secondary | ICD-10-CM | POA: Diagnosis not present

## 2023-10-28 DIAGNOSIS — M25512 Pain in left shoulder: Secondary | ICD-10-CM | POA: Insufficient documentation

## 2023-10-28 DIAGNOSIS — W19XXXA Unspecified fall, initial encounter: Secondary | ICD-10-CM

## 2023-10-28 DIAGNOSIS — Y9301 Activity, walking, marching and hiking: Secondary | ICD-10-CM | POA: Insufficient documentation

## 2023-10-28 DIAGNOSIS — S199XXA Unspecified injury of neck, initial encounter: Secondary | ICD-10-CM | POA: Diagnosis not present

## 2023-10-28 DIAGNOSIS — J439 Emphysema, unspecified: Secondary | ICD-10-CM | POA: Diagnosis not present

## 2023-10-28 MED ORDER — OXYCODONE-ACETAMINOPHEN 5-325 MG PO TABS
1.0000 | ORAL_TABLET | Freq: Once | ORAL | Status: AC
  Administered 2023-10-28: 1 via ORAL
  Filled 2023-10-28: qty 1

## 2023-10-28 MED ORDER — OXYCODONE-ACETAMINOPHEN 5-325 MG PO TABS: 1.0000 | ORAL_TABLET | Freq: Four times a day (QID) | ORAL | 0 refills | Status: AC | PRN

## 2023-10-28 MED ORDER — LIDOCAINE 5 % EX PTCH: 1.0000 | MEDICATED_PATCH | CUTANEOUS | 0 refills | Status: AC

## 2023-10-28 NOTE — ED Provider Triage Note (Signed)
Emergency Medicine Provider Triage Evaluation Note  Derek [name] , a 47 y.o. male  was evaluated in triage.  Pt complains of left shoulder.  Review of Systems  Positive:  Negative:   Physical Exam  BP (!) 158/110 (BP Location: Right Arm)   Pulse 91   Temp 98 F (36.7 C) (Oral)   Resp 18   Ht 5\' 10"  (1.778 m)   Wt 104.3 kg   SpO2 97%   BMI 33.00 kg/m  Gen:   Awake, no distress   Resp:  Normal effort  MSK:   Moves extremities without difficulty  Other:    Medical Decision Making  Medically screening exam initiated at 6:42 PM.  Appropriate orders placed.  Derek [name] was informed that the remainder of the evaluation will be completed by another provider, this initial triage assessment does not replace that evaluation, and the importance of remaining in the ED until their evaluation is complete.   Mechanical fall. Fell down 6 steps. Hurt left shoulder. Also with head trauma. Denies headache, LOC, seizures, blood thinners, vision changes.    Dorthy Cooler, [place] 10/28/23 1843

## 2023-10-28 NOTE — ED Triage Notes (Signed)
Pt states that he was walking down his steps and fell down approximately 6 steps onto bricks, injuring his L shoulder. Pt endorses striking his head, no LOC. C/o L shoulder and neck pain. Not anticoagulated.

## 2023-10-28 NOTE — ED Provider Notes (Signed)
[place] EMERGENCY DEPARTMENT AT [facility] Provider Note   CSN: [id number] Arrival date & time: 10/28/23  1803     History  Chief Complaint  Patient presents with   Fall   Shoulder Injury    Derek [name] is a 47 y.o. male.  47 year old male history of prior CVA who presents to the emergency department today due to a fall.  Patient has a history of prior CVA with residual left-sided deficits.  He says that he went to grab onto the railing on the steps, missed it and fell over backward landing on his left shoulder and head.  He denies LOC.   Fall  Shoulder Injury       Home Medications Prior to Admission medications   Medication Sig Start Date End Date Taking? Authorizing Provider  lidocaine (LIDODERM) 5 % Place 1 patch onto the skin daily. Remove & Discard patch within 12 hours or as directed by MD 10/28/23  Yes Arletha Pili, DO  oxyCODONE-acetaminophen (PERCOCET/ROXICET) 5-325 MG tablet Take 1 tablet by mouth every 6 (six) hours as needed for severe pain (pain score 7-10). 10/28/23  Yes Anders Simmonds T, DO  aspirin 325 MG tablet 1 tablet    [provider]  atorvastatin (LIPITOR) 80 MG tablet Take 1 tablet by mouth daily.    [provider]  baclofen (LIORESAL) 20 MG tablet Take 1 tablet by mouth 4 times daily 05/23/23   Ranelle Oyster, MD  BINAXNOW COVID-19 AG HOME TEST KIT See admin instructions. 09/29/21   [provider]  DULoxetine (CYMBALTA) 60 MG capsule Take 1 capsule (60 mg total) by mouth daily. 05/18/23   Ranelle Oyster, MD  folic acid (FOLVITE) 1 MG tablet Take 1 tablet by mouth once daily 10/27/23   Ranelle Oyster, MD  HYDROcodone-acetaminophen (NORCO/VICODIN) 5-325 MG tablet Take 1 tablet by mouth every 4 (four) hours as needed. Patient not taking: Reported on 08/15/2023 07/24/22   Army Melia A, PA-C  metoprolol tartrate (LOPRESSOR) 25 MG tablet Take 1 tablet by mouth 2 (two) times daily with a meal.     [provider]  Multiple Vitamin (MULTIVITAMIN WITH MINERALS) TABS tablet Take 1 tablet by mouth daily. 01/16/20   Marguerita Merles Latif, DO  polyethylene glycol-electrolytes (NULYTELY) 420 g solution See admin instructions. Patient not taking: Reported on 08/15/2023    [provider]  pregabalin (LYRICA) 50 MG capsule TAKE 1 CAPSULE BY MOUTH THREE TIMES DAILY 03/29/23   Ranelle Oyster, MD  tiZANidine (ZANAFLEX) 4 MG tablet Take 1 tablet by mouth 4 times daily Patient taking differently: Take 4 mg by mouth 3 (three) times daily. 03/29/23   Ranelle Oyster, MD      Allergies    Patient has no known allergies.    Review of Systems   Review of Systems  Physical Exam Updated Vital Signs BP (!) 184/100 (BP Location: Right Arm)   Pulse 78   Temp 98.3 F (36.8 C) (Oral)   Resp 16   Ht 5\' 10"  (1.778 m)   Wt 104.3 kg   SpO2 100%   BMI 33.00 kg/m  Physical Exam Vitals reviewed.  HENT:     Head: Normocephalic and atraumatic.     Mouth/Throat:     Mouth: Mucous membranes are moist.  Cardiovascular:     Rate and Rhythm: Normal rate.     Pulses: Normal pulses.  Pulmonary:     Effort: Pulmonary effort is normal.  Abdominal:     General: Abdomen is flat.  Musculoskeletal:        General: Tenderness present. No swelling.     Comments: Tenderness over the left AC joint  Skin:    General: Skin is warm and dry.  Neurological:     Mental Status: He is alert.     Comments: Patient with residual left-sided paralysis.  This is baseline for him.     ED Results / Procedures / Treatments   Labs (all labs ordered are listed, but only abnormal results are displayed) Labs Reviewed - No data to display  EKG None  Radiology DG Shoulder Left Result Date: 10/28/2023 CLINICAL DATA:  Fall, left shoulder pain EXAM: LEFT SHOULDER - 2+ VIEW COMPARISON:  None Available. FINDINGS: There is no evidence of fracture or dislocation. Mild degenerative changes of the [facility] joint.  Soft tissues are unremarkable. IMPRESSION: Negative. Electronically Signed   By: Duanne Guess D.O.   On: 10/28/2023 19:47   CT Head Wo Contrast Result Date: 10/28/2023 CLINICAL DATA:  Polytrauma, blunt EXAM: CT HEAD WITHOUT CONTRAST CT CERVICAL SPINE WITHOUT CONTRAST TECHNIQUE: Multidetector CT imaging of the head and cervical spine was performed following the standard protocol without intravenous contrast. Multiplanar CT image reconstructions of the cervical spine were also generated. RADIATION DOSE REDUCTION: This exam was performed according to the departmental dose-optimization program which includes automated exposure control, adjustment of the mA and/or kV according to patient size and/or use of iterative reconstruction technique. COMPARISON:  CT head 01/10/2020, x-ray C-spine 08/26/2012 FINDINGS: CT HEAD FINDINGS Brain: Expected interval evolution and encephalomalacia of the right frontoparietal lobe, insula, and basal ganglia in the setting of prior infarction with hemorrhagic conversion. No evidence of large-territorial acute infarction. No parenchymal hemorrhage. No mass lesion. No extra-axial collection. Associated 5 mm left-to-right midline shift in the setting of volume loss on the right. No mass effect or midline shift. No hydrocephalus. Mild ex vacuo of the right lateral ventricle. Basilar cisterns are patent. Vascular: No hyperdense vessel. Skull: No acute fracture or focal lesion. Sinuses/Orbits: Left maxillary mucosal thickening. Otherwise paranasal sinuses and mastoid air cells are clear. The orbits are unremarkable. Other: None. CT CERVICAL SPINE FINDINGS Alignment: Normal. Skull base and vertebrae: Multilevel moderate degenerative changes of the spine most prominent at the C5 through C7 levels. Posterior disc osteophyte complex formation at the C5-C6 and C6-C7 levels. Associated severe C5-C6 and C6-C7 bilateral osseous neural foraminal stenosis. No acute fracture. No aggressive appearing  focal osseous lesion or focal pathologic process. Soft tissues and spinal canal: No prevertebral fluid or swelling. No visible canal hematoma. Upper chest: Biapical paraseptal emphysematous changes. Other: Multiple cavities. Periapical lucencies along the maxillary teeth suggest infection versus loosening in the setting of trauma. IMPRESSION: 1. No acute intracranial abnormality in a patient with right encephalomalacia and associated left-to-right midline shift likely due to volume loss on the right. 2. No acute displaced fracture or traumatic listhesis of the cervical spine. 3. Multilevel moderate degenerative changes of the spine most prominent at the C5 through C7 levels. Posterior disc osteophyte complex formation at the C5-C6 and C6-C7 levels. Associated severe C5-C6 and C6-C7 bilateral osseous neural foraminal stenosis. 4.  Emphysema (ICD10-J43.9). Electronically Signed   By: Tish Frederickson M.D.   On: 10/28/2023 19:36   CT Cervical Spine Wo Contrast Result Date: 10/28/2023 CLINICAL DATA:  Polytrauma, blunt EXAM: CT HEAD WITHOUT CONTRAST CT CERVICAL SPINE WITHOUT CONTRAST TECHNIQUE: Multidetector CT imaging of the head and cervical spine was performed  following the standard protocol without intravenous contrast. Multiplanar CT image reconstructions of the cervical spine were also generated. RADIATION DOSE REDUCTION: This exam was performed according to the departmental dose-optimization program which includes automated exposure control, adjustment of the mA and/or kV according to patient size and/or use of iterative reconstruction technique. COMPARISON:  CT head 01/10/2020, x-ray C-spine 08/26/2012 FINDINGS: CT HEAD FINDINGS Brain: Expected interval evolution and encephalomalacia of the right frontoparietal lobe, insula, and basal ganglia in the setting of prior infarction with hemorrhagic conversion. No evidence of large-territorial acute infarction. No parenchymal hemorrhage. No mass lesion. No  extra-axial collection. Associated 5 mm left-to-right midline shift in the setting of volume loss on the right. No mass effect or midline shift. No hydrocephalus. Mild ex vacuo of the right lateral ventricle. Basilar cisterns are patent. Vascular: No hyperdense vessel. Skull: No acute fracture or focal lesion. Sinuses/Orbits: Left maxillary mucosal thickening. Otherwise paranasal sinuses and mastoid air cells are clear. The orbits are unremarkable. Other: None. CT CERVICAL SPINE FINDINGS Alignment: Normal. Skull base and vertebrae: Multilevel moderate degenerative changes of the spine most prominent at the C5 through C7 levels. Posterior disc osteophyte complex formation at the C5-C6 and C6-C7 levels. Associated severe C5-C6 and C6-C7 bilateral osseous neural foraminal stenosis. No acute fracture. No aggressive appearing focal osseous lesion or focal pathologic process. Soft tissues and spinal canal: No prevertebral fluid or swelling. No visible canal hematoma. Upper chest: Biapical paraseptal emphysematous changes. Other: Multiple cavities. Periapical lucencies along the maxillary teeth suggest infection versus loosening in the setting of trauma. IMPRESSION: 1. No acute intracranial abnormality in a patient with right encephalomalacia and associated left-to-right midline shift likely due to volume loss on the right. 2. No acute displaced fracture or traumatic listhesis of the cervical spine. 3. Multilevel moderate degenerative changes of the spine most prominent at the C5 through C7 levels. Posterior disc osteophyte complex formation at the C5-C6 and C6-C7 levels. Associated severe C5-C6 and C6-C7 bilateral osseous neural foraminal stenosis. 4.  Emphysema (ICD10-J43.9). Electronically Signed   By: Tish Frederickson M.D.   On: 10/28/2023 19:36    Procedures Procedures    Medications Ordered in ED Medications  oxyCODONE-acetaminophen (PERCOCET/ROXICET) 5-325 MG per tablet 1 tablet (has no administration in  time range)    ED Course/ Medical Decision Making/ A&P                                 Medical Decision Making 47 year old male here today with pain in left shoulder after a fall.  Differential diagnoses include left shoulder contusion, left shoulder fracture, less likely intracranial hemorrhage, less likely cervical spine injury.  Plan-my dependent review the patient's head CT shows no intracranial hemorrhage.  I reviewed the patient's shoulder x-ray and cervical spine imaging.  No acute process.  Provide some analgesia for the patient.  He is going to follow-up with his PCP.  Will discharge.  Risk Prescription drug management.           Final Clinical Impression(s) / ED Diagnoses Final diagnoses:  Fall, initial encounter    Rx / DC Orders ED Discharge Orders          Ordered    lidocaine (LIDODERM) 5 %  Every 24 hours        10/28/23 2316    oxyCODONE-acetaminophen (PERCOCET/ROXICET) 5-325 MG tablet  Every 6 hours PRN        10/28/23 2316  Anders Simmonds T, DO 10/28/23 2319

## 2023-10-28 NOTE — Discharge Instructions (Addendum)
While you are in the emergency department you had a CT scan done of your head and your neck that was normal.  Your shoulder x-ray did not show any fracture.  You likely just bumped the area really hard, and will likely hurt over the next few days.  You can take Motrin and Tylenol at home.  I sent a few stronger narcotics to your pharmacy.  He can take these as needed for breakthrough pain.  You can also apply lidocaine patches to the area.  Please follow-up with your primary care doctor within 1 week.

## 2023-10-29 ENCOUNTER — Other Ambulatory Visit: Payer: Self-pay | Admitting: Physical Medicine & Rehabilitation

## 2023-10-29 DIAGNOSIS — W19XXXA Unspecified fall, initial encounter: Secondary | ICD-10-CM | POA: Diagnosis not present

## 2023-10-29 DIAGNOSIS — I69054 Hemiplegia and hemiparesis following nontraumatic subarachnoid hemorrhage affecting left non-dominant side: Secondary | ICD-10-CM | POA: Diagnosis not present

## 2023-12-12 ENCOUNTER — Ambulatory Visit: Payer: Medicare HMO | Attending: Family Medicine | Admitting: Physical Therapy

## 2023-12-12 ENCOUNTER — Other Ambulatory Visit: Payer: Self-pay

## 2023-12-12 ENCOUNTER — Encounter: Payer: Self-pay | Admitting: Physical Therapy

## 2023-12-12 ENCOUNTER — Ambulatory Visit: Payer: Medicare HMO | Admitting: Occupational Therapy

## 2023-12-12 ENCOUNTER — Encounter: Payer: Self-pay | Admitting: Occupational Therapy

## 2023-12-12 VITALS — BP 120/88 | HR 72

## 2023-12-12 DIAGNOSIS — R278 Other lack of coordination: Secondary | ICD-10-CM | POA: Diagnosis not present

## 2023-12-12 DIAGNOSIS — I63511 Cerebral infarction due to unspecified occlusion or stenosis of right middle cerebral artery: Secondary | ICD-10-CM | POA: Diagnosis not present

## 2023-12-12 DIAGNOSIS — R2681 Unsteadiness on feet: Secondary | ICD-10-CM | POA: Diagnosis not present

## 2023-12-12 DIAGNOSIS — R2689 Other abnormalities of gait and mobility: Secondary | ICD-10-CM | POA: Insufficient documentation

## 2023-12-12 DIAGNOSIS — M6281 Muscle weakness (generalized): Secondary | ICD-10-CM | POA: Diagnosis not present

## 2023-12-12 DIAGNOSIS — G8110 Spastic hemiplegia affecting unspecified side: Secondary | ICD-10-CM | POA: Diagnosis not present

## 2023-12-12 DIAGNOSIS — R293 Abnormal posture: Secondary | ICD-10-CM | POA: Insufficient documentation

## 2023-12-12 NOTE — Therapy (Signed)
 OUTPATIENT PHYSICAL THERAPY NEURO EVALUATION   Patient Name: Derek [name] MRN: [id number] DOB:06-Nov-1975, 48 y.o., male Today's Date: 12/12/2023   PCP: Dorena Gander, MD REFERRING PROVIDER: Rawland Caddy, MD  END OF SESSION:  PT End of Session - 12/12/23 1329     Visit Number 1    Number of Visits 1    Authorization Type [employer] MEDICARE    PT Start Time 1315    PT Stop Time 1359    PT Time Calculation (min) 44 min    Equipment Utilized During Treatment Gait belt    Behavior During Therapy WFL for tasks assessed/performed            Past Medical History:  Diagnosis Date   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/07/2020   Alcohol dependence (HCC)    Back pain    Marijuana dependence (HCC)    Tobacco dependence    Past Surgical History:  Procedure Laterality Date   BUBBLE STUDY  01/10/2020   Procedure: BUBBLE STUDY;  Surgeon: Harrold Lincoln, MD;  Location: [facility] ENDOSCOPY;  Service: Cardiovascular;;   TEE WITHOUT CARDIOVERSION N/A 01/10/2020   Procedure: TRANSESOPHAGEAL ECHOCARDIOGRAM (TEE);  Surgeon: Harrold Lincoln, MD;  Location: [facility] ENDOSCOPY;  Service: Cardiovascular;  Laterality: N/A;   Patient Active Problem List   Diagnosis Date Noted   Moderate major depression, single episode (HCC) 04/14/2022   Arteriosclerosis of carotid artery 02/01/2022   Essential hypertension 02/01/2022   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) 02/01/2022   History of substance abuse (HCC) 02/01/2022   Iron deficiency anemia 02/01/2022   Late effects of cerebrovascular disease 02/01/2022   Obesity 02/01/2022   Pure hypercholesterolemia 02/01/2022   Pain due to onychomycosis of toenails of both feet 06/10/2021   Neuropathic pain 04/09/2020   Pain    Spastic hemiparesis (HCC)    History of hypertension    Dyslipidemia    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   Leukocytosis 01/12/2020   Cerebrovascular accident  (CVA) due to thrombosis of right middle cerebral artery (HCC)    Chronic pain syndrome    Hypokalemia    Dysphagia, post-stroke    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/07/2020   Tobacco dependence    Marijuana dependence (HCC)    Alcohol dependence (HCC)     ONSET DATE: 2021 (CVA)  REFERRING DIAG: I63.511 (ICD-10-CM) - Right middle cerebral artery stroke (HCC) G81.10 (ICD-10-CM) - Spastic hemiparesis (HCC)  THERAPY DIAG:  Muscle weakness  Other lack of coordination  Spastic hemiplegia affecting nondominant side (HCC)  Abnormal posture  Muscle weakness (generalized)  Other abnormalities of gait and mobility  Unsteadiness on feet  Rationale for Evaluation and Treatment: Rehabilitation  SUBJECTIVE:  SUBJECTIVE STATEMENT: Patient presents alone, using LBQC following OT. He endorses continuing to feel off balance and struggling to walk.  He requests PT to replace a stopper on his [facility] - time spent doing this for ambulatory safety.  He recently had a fall without mechanical injury.  He states he is working on "trying not to fall down my steps" when asked what he has been up to at home in his absence from PT. Pt accompanied by: self  PERTINENT HISTORY: R MCA CVA, Alcohol Dependence, Marijuana/Tobacco Dependence, Back Pain   PAIN:  Are you having pain? Yes: NPRS scale: 6/10 Pain location: L shoulder and right thumb Pain description: aching Aggravating factors: laying on L side Relieving factors: pain medicine or patches  Today's Vitals   12/12/23 1326  BP: 120/88  Pulse: 72   PRECAUTIONS: Fall and Other: L spastic hemi  WEIGHT BEARING RESTRICTIONS: No  FALLS: Has patient fallen in last 6 months? No  LIVING ENVIRONMENT: Lives with:  roommate Lives in: House/apartment Stairs:  Yes: External: 8 steps; bilateral but cannot reach both Has following equipment at home: Quad cane large base, Hemi walker, Wheelchair (manual), shower chair, and Grab bars - he reports no one ever put his ramp up but is unable to elaborate on who was supposed to place a ramp in his home - PT provides resources for pt to reach out to  PLOF: Independent with household mobility with device, Needs assistance with ADLs, and Needs assistance with homemaking-aide comes in the mornings for 4 hours on weekdays and 2 hours on weekends; will be getting new aide when current aide is out  PATIENT GOALS: "he just told me I need to go back"  OBJECTIVE:   DIAGNOSTIC FINDINGS: 01/09/20 head CT: IMPRESSION: 1. Evolving right cerebral infarct with evidence for hemorrhagic transformation at the right basal ganglia, similar in appearance and distribution as compared to previous MRI. Associated mass effect with 3 mm of right-to-left shift. No hydrocephalus or ventricular trapping. 2. No other new acute intracranial abnormality.  CT Head 10/27/2024 following fall: IMPRESSION: 1. No acute intracranial abnormality in a patient with right encephalomalacia and associated left-to-right midline shift likely due to volume loss on the right. 2. No acute displaced fracture or traumatic listhesis of the cervical spine. 3. Multilevel moderate degenerative changes of the spine most prominent at the C5 through C7 levels. Posterior disc osteophyte complex formation at the C5-C6 and C6-C7 levels. Associated severe C5-C6 and C6-C7 bilateral osseous neural foraminal stenosis. 4.  Emphysema (ICD10-J43.9).  COGNITION: Overall cognitive status: Within functional limits for tasks assessed   SENSATION: WFL  COORDINATION: LE RAMS:  limited by lack of DF AROM on L Heel-to-shin:  can cross left to right, but cannot bring heel directly to shin   MUSCLE TONE: LLE: Hypertonic Clonus present  POSTURE: rounded shoulders,  forward head, increased thoracic kyphosis, posterior pelvic tilt, left pelvic obliquity, and weight shift right  LOWER EXTREMITY MMT:    MMT Right Eval Left Eval  Hip flexion 5 3  Hip extension    Hip abduction 5 3  Hip adduction 5 3  Hip internal rotation    Hip external rotation    Knee flexion 4 2/5; appears limited by spasticity  Knee extension 5 3 w/ increased time he can achieve full extension  Ankle dorsiflexion 5 1  Ankle plantarflexion    Ankle inversion    Ankle eversion    (Blank rows = not tested)  BED MOBILITY:  Independent per  patient report - he reports his bed is lower which helps  TRANSFERS: Assistive device utilized: Quad cane large base  Sit to stand: SBA Stand to sit: SBA Chair to chair: SBA  GAIT: Gait pattern: step to pattern, decreased arm swing- Left, decreased step length- Right, decreased stance time- Left, decreased hip/knee flexion- Left, decreased ankle dorsiflexion- Left, circumduction- Left, Left hip hike, lateral lean- Right, trunk rotated posterior- Left, and poor foot clearance- Left Distance walked: various clinic distances Assistive device utilized: Quad cane large base Level of assistance: SBA Comments:  LUE held in flexion and IR (using balloon in left hand per OT request to stretch hand).  Gentle L knee flexion maintained in stance due to spasticity.  FUNCTIONAL TESTS:   FROM EVAL PRIOR: 5 times sit to stand: 18.53 seconds w/ RUE support and right lateral lean Timed up and go (TUG): 22.69 seconds w/ CGA and LBQC Berg Balance Scale: To be finished next session.  12/12/2023: 5 times sit to stand: 13.44 seconds w/ RUE support and right lateral lean, pt maintains crouched LE throughout despite cuing for full stand Timed up and go (TUG): 18.81 seconds w/ CGA and LBQC; crouched gait and wide turn  PATIENT SURVEYS:  FOTO not captured due to chronicity of CVA.  TODAY'S TREATMENT:                                                                                                                               -Verbally reviewed HEP and answered questions -Edu on neuroplasticity and chronic stroke and needing more consistent activity at home within his capability to make progress and justify benefit of further outpatient PT services.  PATIENT EDUCATION: Education details: PT POC, assessments used and to be used, appropriate BP parameters (pt seems to have made improvements), and goals of therapy. Person educated: Patient Education method: Explanation Education comprehension: verbalized understanding and needs further education  HOME EXERCISE PROGRAM: [id number] -reprinted and reviewed 2/10 as pt lost paperwork and reports being inactive at home; discussed caregiver portion of HEP to improve ROM at home when he is having more difficult days  ASSESSMENT:  CLINICAL IMPRESSION: Patient is a 48 y.o. male who was seen today for physical therapy evaluation and treatment for L spastic hemiparesis due to chronic CVA.  Pt has a significant PMH of R MCA CVA, Alcohol Dependence, Marijuana/Tobacco Dependence, and Back Pain.  Identified impairments include left shoulder pain, difficulty completing ADLs and homemaking, impaired LE coordination, left LE spasticity and clonus, chronic postural changes of upper trunk and pelvis, left hemiparesis with left hip and knee flexion most impaired, spastic LUE maintained in flexion, and left knee in flexion during stance phase of gait.  Evaluation via the following assessment tools: 5xSTS score of 13.44 seconds w/ RUE support and TUG time of 18.81 seconds using LBQC indicate significant fall risk.  His time to complete these assessments has improved, but his mechanics appeared more crouched and with similar  poor AD management.  He remains noncompliant to home regimen and is generally inactive and unmotivated to consistently work on deficits when not in therapy which has been a barrier in previous episodes of care  limiting patient progress.  His HEP was reprinted and reviewed today with extensive education on neuroplasticity and ways to improve motivation at home.  Patient is not appropriate for therapy in this setting at this time, but may benefit from re-assessment in the future.    CLINICAL DECISION MAKING: Stable/uncomplicated  EVALUATION COMPLEXITY: Low  PLAN:  PT FREQUENCY: one time visit   Earlean Glaze, PT, DPT 12/12/2023, 2:00 PM

## 2023-12-12 NOTE — Therapy (Signed)
OUTPATIENT OCCUPATIONAL THERAPY NEURO EVALUATION AND DISCHARGE  Patient Name: Derek [name] MRN: [id number] DOB:02-21-1976, 48 y.o., male Today's Date: 12/12/2023  PCP: Aliene Beams, MD REFERRING PROVIDER: Ranelle Oyster, MD  END OF SESSION:  OT End of Session - 12/12/23 1242     Visit Number 1    Authorization Type [employer] MEDICARE HMO    OT Start Time 1239    Activity Tolerance Patient tolerated treatment well    Behavior During Therapy [facility] for tasks assessed/performed            Past Medical History:  Diagnosis Date   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/07/2020   Alcohol dependence (HCC)    Back pain    Marijuana dependence (HCC)    Tobacco dependence    Past Surgical History:  Procedure Laterality Date   BUBBLE STUDY  01/10/2020   Procedure: BUBBLE STUDY;  Surgeon: Sande Rives, MD;  Location: [facility] ENDOSCOPY;  Service: Cardiovascular;;   TEE WITHOUT CARDIOVERSION N/A 01/10/2020   Procedure: TRANSESOPHAGEAL ECHOCARDIOGRAM (TEE);  Surgeon: Sande Rives, MD;  Location: [facility] ENDOSCOPY;  Service: Cardiovascular;  Laterality: N/A;   Patient Active Problem List   Diagnosis Date Noted   Moderate major depression, single episode (HCC) 04/14/2022   Arteriosclerosis of carotid artery 02/01/2022   Essential hypertension 02/01/2022   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) 02/01/2022   History of substance abuse (HCC) 02/01/2022   Iron deficiency anemia 02/01/2022   Late effects of cerebrovascular disease 02/01/2022   Obesity 02/01/2022   Pure hypercholesterolemia 02/01/2022   Pain due to onychomycosis of toenails of both feet 06/10/2021   Neuropathic pain 04/09/2020   Pain    Spastic hemiparesis (HCC)    History of hypertension    Dyslipidemia    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   Leukocytosis 01/12/2020   Cerebrovascular accident (CVA) due to thrombosis of right middle cerebral artery  (HCC)    Chronic pain syndrome    Hypokalemia    Dysphagia, post-stroke    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/07/2020   Tobacco dependence    Marijuana dependence (HCC)    Alcohol dependence (HCC)    ONSET DATE: referral: 09/28/2023  REFERRING DIAG: W09.811 (ICD-10-CM) - Right middle cerebral artery stroke (HCC) G81.10 (ICD-10-CM) - Spastic hemiparesis (HCC)  DX: spasticity LUE s/p botox pecs and finger flexors on 02/09/23 - scheduled again next month (August) DX: CVA with spastic left hemiparesis RX: Eval and treat, resume therapies, bp better controlledDX: CVA with spastic left hemiparesis RX: Eval and treat, resume therapies, bp better controlled THERAPY DIAG:  Spastic hemiplegia affecting nondominant side (HCC)  Rationale for Evaluation and Treatment: Rehabilitation  SUBJECTIVE:   SUBJECTIVE STATEMENT: He had a significant fall and hurt his L shoulder. No bruising but pain that discourages use. He has been wearing his resting hand splint at night but reports his hand balls back up upon removal. He just quit taking Baclofen and is waiting on a refill of his Lyrica.   Pt accompanied by: self  PERTINENT HISTORY: Spastic L Hemiparesis.  PMH: R MCA CVA, Alcohol Dependence (occasional), Marijuana (NA)/Tobacco ([place]) Dependence, Back Pain  PRECAUTIONS: Other: no driving  WEIGHT BEARING RESTRICTIONS: No  PAIN:  Are you having pain? Yes: NPRS scale: 7/10 Pain location: LUE - shoulder Pain description: aching  Aggravating factors: laying on it Relieving factors: Pain patch (doesn't have rx anymore) stretches, pain meds  FALLS: Has patient fallen  in last 6 months? Yes. Number of falls 1  LIVING ENVIRONMENT: Lives with:  roommate Lives in: House 8 steps to enter (B rails but uses R side rail going up), 1 level home Has following equipment at home: Quad cane small base, Wheelchair (manual), Shower bench, and bed side commode - over the toilet in the bathroom, urinal at  night   PLOF: Independent prior to stroke 2021, on disability  PATIENT GOALS: Improve Lt arm mobility and balance  OBJECTIVE:   HAND DOMINANCE: Right  ADLs: Overall ADLs: Has aide M-Sun 2-4 hrs/day to assist with bathing, and some dressing, cooking and cleaning Transfers/ambulation related to ADLs: mod I w/ quad cane Eating: mod I eating, difficulty cutting food and occasional assist from aide Grooming: mod I brushing teeth and shaving UB Dressing: mod I for pull over shirt, assist for button up shirt and jacket LB Dressing: Aide assist getting pants over feet and assist for donning shoes and socks Toileting: mod I Bathing: max assist from aide Tub Shower transfers: min assist for leg management with tub bench Equipment: Transfer tub bench, Grab bars, and hand held shower  IADLs: Shopping: has groceries delivered [employer]: aide does Meal Prep: aide or roommate does but can get snack or heat up something in microwave after someone gets it out of the fridge etc for him Community mobility: relies on friends Medication management: independent s/p aide sorting it into his pill box Financial management: roommate does Handwriting:  denies change  MOBILITY STATUS:  walks with quad cane in house and short distances  UPPER EXTREMITY ROM:  RUE AROM WNLs  LUE: Limited active movement - sh shrug w/ slight elbow flex ~60*dominated by synergy pattern  Passive finger extension full with wrist flexion. Difficulty at PIP joint extension, especially LF - at rest, PIP flexed, DIP hyperextended; palpable muscle contraction with digit flex and ext  HAND FUNCTION: No function LT HAND  COORDINATION: No function Lt hand  SENSATION: Significantly diminished - cannot detect location, entire L UE is dull feeling  EDEMA: no significant edema reported or observed  MUSCLE TONE: LUE: Moderate, Hypertonic, and Modifed Ashworth Scale 2 = More marked increase in muscle tone through most of  the ROM, but affected part(s) easily moved  COGNITION: Overall cognitive status:  pt reports memory deficits  VISION: Subjective report: still reports that he need glasses for reading as reported at previous OT eval 07/01/2023.  Said he was 20/30 last time he went to the eye doctor but no changes in vision from stroke  Visual history:  none  PERCEPTION: Not tested  PRAXIS: Not tested  OBSERVATIONS: Pleasant gentlemen with somewhat flat affect and significant limitations with L UE ie) flickers of movement only noted although synergy patterns noted. Ambulates with quad cane. SB-CGA  TODAY'S TREATMENT:  OT educated pt on completing prior HEPs for chronic management of LUE spasticity, ROM, and pain. Additional printouts of these exercises were provided and pt was directed to MyChart to access exercises this was as well.   OT educated pt that he has not had any change with his functional status. He should complete HEP as recommended during his previous therapy episode.   OT unaware of pt's status with Myomo. Will reach out to rep and get back to pt with update. Will not require therapy until device is delivered to the pt. Recommended new order if he receives the device to educate on use.   PATIENT EDUCATION: Education details: OT POC; HEP completion; Myomo Person educated: Patient Education method: Explanation Education comprehension: verbalized understanding  HOME EXERCISE PROGRAM: Reprinted HEPs from previous therapy episode.   GOALS:   SHORT TERM GOALS: Target date:12/12/2023  Pt to verbalize understanding with HEP recommendations for overall management of impairments following chronic stroke.  Baseline:  Goal status: MET  ASSESSMENT:  CLINICAL IMPRESSION: Patient is a 48 y.o. male who was seen today for occupational therapy evaluation for spastic  hemiplegia Lt non dominant side from stroke in 2021. Pt received botox injections to LUE 09/28/2023. Based on his assessment today, pt has not had a change in his functional status and has been noncompliant with HEP as recommended during previous therapy episodes. Reviewed HEP and spasticity management to be completed. Would recommend new OT referral IF there is a significant change in functional status or if he receives Myomo device, which would require functional training. No further need for skilled OT services at this time.   PERFORMANCE DEFICITS: in functional skills including ADLs, IADLs, coordination, dexterity, sensation, tone, ROM, strength, pain, flexibility, Fine motor control, Gross motor control, mobility, body mechanics, decreased knowledge of use of DME, and UE functional use, cognitive skills including memory, and psychosocial skills including coping strategies.   IMPAIRMENTS: are limiting patient from ADLs, IADLs, and rest and sleep.   CO-MORBIDITIES: may have co-morbidities  that affects occupational performance. Patient will benefit from skilled OT to address above impairments and improve overall function.  MODIFICATION OR ASSISTANCE TO COMPLETE EVALUATION: Min-Moderate modification of tasks or assist with assess necessary to complete an evaluation.  OT OCCUPATIONAL PROFILE AND HISTORY: Detailed assessment: Review of records and additional review of physical, cognitive, psychosocial history related to current functional performance.  CLINICAL DECISION MAKING: Moderate - several treatment options, min-mod task modification necessary  REHAB POTENTIAL: Good for goal stated  EVALUATION COMPLEXITY: Moderate    PLAN:  OT FREQUENCY/DURATION: Evaluation only - pt should continue with this current HEP and is receiving paid caregiver assistance.   Delana Meyer, OT 12/12/2023, 12:51 PM

## 2023-12-27 ENCOUNTER — Ambulatory Visit: Payer: Medicare HMO | Admitting: Podiatry

## 2024-01-05 ENCOUNTER — Encounter: Payer: Self-pay | Admitting: Podiatry

## 2024-01-05 ENCOUNTER — Ambulatory Visit: Payer: Medicare HMO | Admitting: Podiatry

## 2024-01-05 VITALS — Ht 70.0 in | Wt 230.0 lb

## 2024-01-05 DIAGNOSIS — B351 Tinea unguium: Secondary | ICD-10-CM | POA: Diagnosis not present

## 2024-01-05 DIAGNOSIS — M79674 Pain in right toe(s): Secondary | ICD-10-CM | POA: Diagnosis not present

## 2024-01-05 DIAGNOSIS — M792 Neuralgia and neuritis, unspecified: Secondary | ICD-10-CM | POA: Diagnosis not present

## 2024-01-05 DIAGNOSIS — M79675 Pain in left toe(s): Secondary | ICD-10-CM

## 2024-01-05 NOTE — Progress Notes (Signed)
 This patient presents to the office for evaluation and treatment of long thick painful nails .  This patient is unable to trim his own nails since the patient cannot reach his feet.  Patient says the nails are painful walking and wearing his shoes.  Patient has history of CVA .   He returns for preventive foot care services.  General Appearance  Alert, conversant and in no acute stress.  Vascular  Dorsalis pedis and posterior tibial  pulses are palpable  bilaterally.  Capillary return is within normal limits  bilaterally. Temperature is within normal limits  bilaterally.  Neurologic  Senn-Weinstein monofilament wire test within normal limits  bilaterally. Muscle power within normal limits bilaterally.  Nails Thick disfigured discolored nails with subungual debris  from hallux to fifth toes bilaterally. No evidence of bacterial infection or drainage bilaterally.  Orthopedic  No limitations of motion  feet .  No crepitus or effusions noted.  No bony pathology or digital deformities noted.  Skin  normotropic skin with no porokeratosis noted bilaterally.  No signs of infections or ulcers noted.     Onychomycosis  Pain in toes right foot  Pain in toes left foot  Debridement  of nails  1-5  B/L with a nail nipper.  Nails were then filed using a dremel tool with no incidents.    RTC  12   weeks    Helane Gunther DPM

## 2024-01-11 ENCOUNTER — Encounter: Payer: Medicare HMO | Attending: Physical Medicine & Rehabilitation | Admitting: Physical Medicine & Rehabilitation

## 2024-01-21 ENCOUNTER — Other Ambulatory Visit: Payer: Self-pay | Admitting: Physical Medicine & Rehabilitation

## 2024-02-06 DIAGNOSIS — Z720 Tobacco use: Secondary | ICD-10-CM | POA: Diagnosis not present

## 2024-02-06 DIAGNOSIS — I10 Essential (primary) hypertension: Secondary | ICD-10-CM | POA: Diagnosis not present

## 2024-02-06 DIAGNOSIS — I6930 Unspecified sequelae of cerebral infarction: Secondary | ICD-10-CM | POA: Diagnosis not present

## 2024-02-06 DIAGNOSIS — I6529 Occlusion and stenosis of unspecified carotid artery: Secondary | ICD-10-CM | POA: Diagnosis not present

## 2024-02-06 DIAGNOSIS — M25552 Pain in left hip: Secondary | ICD-10-CM | POA: Diagnosis not present

## 2024-02-06 DIAGNOSIS — Z23 Encounter for immunization: Secondary | ICD-10-CM | POA: Diagnosis not present

## 2024-02-06 DIAGNOSIS — I69354 Hemiplegia and hemiparesis following cerebral infarction affecting left non-dominant side: Secondary | ICD-10-CM | POA: Diagnosis not present

## 2024-04-05 ENCOUNTER — Ambulatory Visit (INDEPENDENT_AMBULATORY_CARE_PROVIDER_SITE_OTHER): Admitting: Podiatry

## 2024-04-05 ENCOUNTER — Encounter: Payer: Self-pay | Admitting: Podiatry

## 2024-04-05 DIAGNOSIS — B351 Tinea unguium: Secondary | ICD-10-CM

## 2024-04-05 DIAGNOSIS — M79674 Pain in right toe(s): Secondary | ICD-10-CM | POA: Diagnosis not present

## 2024-04-05 DIAGNOSIS — I63311 Cerebral infarction due to thrombosis of right middle cerebral artery: Secondary | ICD-10-CM

## 2024-04-05 DIAGNOSIS — M79675 Pain in left toe(s): Secondary | ICD-10-CM | POA: Diagnosis not present

## 2024-04-05 NOTE — Progress Notes (Signed)
 This patient presents to the office for evaluation and treatment of long thick painful nails .  This patient is unable to trim his own nails since the patient cannot reach his feet.  Patient says the nails are painful walking and wearing his shoes.  Patient has history of CVA .   He returns for preventive foot care services.  General Appearance  Alert, conversant and in no acute stress.  Vascular  Dorsalis pedis and posterior tibial  pulses are palpable  bilaterally.  Capillary return is within normal limits  bilaterally. Temperature is within normal limits  bilaterally.  Neurologic  Senn-Weinstein monofilament wire test within normal limits  bilaterally. Muscle power within normal limits bilaterally.  Nails Thick disfigured discolored nails with subungual debris  from hallux to fifth toes bilaterally. No evidence of bacterial infection or drainage bilaterally.  Orthopedic  No limitations of motion  feet .  No crepitus or effusions noted.  No bony pathology or digital deformities noted.  Skin  normotropic skin with no porokeratosis noted bilaterally.  No signs of infections or ulcers noted.     Onychomycosis  Pain in toes right foot  Pain in toes left foot  Debridement  of nails  1-5  B/L with a nail nipper.  Nails were then filed using a dremel tool with no incidents.    RTC  12   weeks    Helane Gunther DPM

## 2024-05-21 ENCOUNTER — Other Ambulatory Visit: Payer: Self-pay | Admitting: Physical Medicine & Rehabilitation

## 2024-07-05 ENCOUNTER — Ambulatory Visit: Admitting: Podiatry

## 2024-07-25 ENCOUNTER — Ambulatory Visit (INDEPENDENT_AMBULATORY_CARE_PROVIDER_SITE_OTHER): Admitting: Podiatry

## 2024-07-25 ENCOUNTER — Encounter: Payer: Self-pay | Admitting: Podiatry

## 2024-07-25 DIAGNOSIS — M79675 Pain in left toe(s): Secondary | ICD-10-CM | POA: Diagnosis not present

## 2024-07-25 DIAGNOSIS — I63311 Cerebral infarction due to thrombosis of right middle cerebral artery: Secondary | ICD-10-CM | POA: Diagnosis not present

## 2024-07-25 DIAGNOSIS — B351 Tinea unguium: Secondary | ICD-10-CM

## 2024-07-25 DIAGNOSIS — M79674 Pain in right toe(s): Secondary | ICD-10-CM | POA: Diagnosis not present

## 2024-07-25 NOTE — Progress Notes (Signed)
 This patient presents to the office for evaluation and treatment of long thick painful nails .  This patient is unable to trim his own nails since the patient cannot reach his feet.  Patient says the nails are painful walking and wearing his shoes.  Patient has history of CVA .   He returns for preventive foot care services.  General Appearance  Alert, conversant and in no acute stress.  Vascular  Dorsalis pedis and posterior tibial  pulses are palpable  bilaterally.  Capillary return is within normal limits  bilaterally. Temperature is within normal limits  bilaterally.  Neurologic  Senn-Weinstein monofilament wire test within normal limits  bilaterally. Muscle power within normal limits bilaterally.  Nails Thick disfigured discolored nails with subungual debris  from hallux to fifth toes bilaterally. No evidence of bacterial infection or drainage bilaterally.  Orthopedic  No limitations of motion  feet .  No crepitus or effusions noted.  No bony pathology or digital deformities noted.  Skin  normotropic skin with no porokeratosis noted bilaterally.  No signs of infections or ulcers noted.     Onychomycosis  Pain in toes right foot  Pain in toes left foot  Debridement  of nails  1-5  B/L with a nail nipper.  Nails were then filed using a dremel tool with no incidents.    RTC  12   weeks    Helane Gunther DPM

## 2024-10-24 ENCOUNTER — Ambulatory Visit: Admitting: Podiatry

## 2024-10-30 ENCOUNTER — Ambulatory Visit (INDEPENDENT_AMBULATORY_CARE_PROVIDER_SITE_OTHER): Admitting: Podiatry

## 2024-10-30 ENCOUNTER — Encounter: Payer: Self-pay | Admitting: Podiatry

## 2024-10-30 VITALS — Ht 70.0 in | Wt 230.0 lb

## 2024-10-30 DIAGNOSIS — M79675 Pain in left toe(s): Secondary | ICD-10-CM

## 2024-10-30 DIAGNOSIS — M79674 Pain in right toe(s): Secondary | ICD-10-CM

## 2024-10-30 DIAGNOSIS — B351 Tinea unguium: Secondary | ICD-10-CM | POA: Diagnosis not present

## 2024-10-30 DIAGNOSIS — M792 Neuralgia and neuritis, unspecified: Secondary | ICD-10-CM | POA: Diagnosis not present

## 2024-10-30 DIAGNOSIS — I63311 Cerebral infarction due to thrombosis of right middle cerebral artery: Secondary | ICD-10-CM | POA: Diagnosis not present

## 2024-10-30 NOTE — Progress Notes (Signed)
 This patient presents to the office for evaluation and treatment of long thick painful nails .  This patient is unable to trim his own nails since the patient cannot reach his feet.  Patient says the nails are painful walking and wearing his shoes.  Patient has history of CVA .   He returns for preventive foot care services.  General Appearance  Alert, conversant and in no acute stress.  Vascular  Dorsalis pedis and posterior tibial  pulses are palpable  bilaterally.  Capillary return is within normal limits  bilaterally. Temperature is within normal limits  bilaterally.  Neurologic  Senn-Weinstein monofilament wire test within normal limits  bilaterally. Muscle power within normal limits bilaterally.  Nails Thick disfigured discolored nails with subungual debris  from hallux to fifth toes bilaterally. No evidence of bacterial infection or drainage bilaterally.  Orthopedic  No limitations of motion  feet .  No crepitus or effusions noted.  No bony pathology or digital deformities noted.  Skin  normotropic skin with no porokeratosis noted bilaterally.  No signs of infections or ulcers noted.     Onychomycosis  Pain in toes right foot  Pain in toes left foot  Debridement  of nails  1-5  B/L with a nail nipper.  Nails were then filed using a dremel tool with no incidents.    RTC  12   weeks    Helane Gunther DPM

## 2024-11-30 ENCOUNTER — Other Ambulatory Visit: Payer: Self-pay | Admitting: Physical Medicine & Rehabilitation

## 2025-01-28 ENCOUNTER — Ambulatory Visit: Admitting: Podiatry
